# Patient Record
Sex: Female | Born: 1955
Health system: Southern US, Community
[De-identification: ages and names within clinical notes are randomized; demographics above are authoritative.]

## PROBLEM LIST (undated history)

## (undated) DIAGNOSIS — G4730 Sleep apnea, unspecified: Secondary | ICD-10-CM

## (undated) DIAGNOSIS — E119 Type 2 diabetes mellitus without complications: Secondary | ICD-10-CM

## (undated) DIAGNOSIS — J45909 Unspecified asthma, uncomplicated: Secondary | ICD-10-CM

## (undated) DIAGNOSIS — Z9889 Other specified postprocedural states: Secondary | ICD-10-CM

## (undated) DIAGNOSIS — D649 Anemia, unspecified: Secondary | ICD-10-CM

## (undated) DIAGNOSIS — R600 Localized edema: Secondary | ICD-10-CM

## (undated) DIAGNOSIS — I10 Essential (primary) hypertension: Secondary | ICD-10-CM

## (undated) DIAGNOSIS — I4891 Unspecified atrial fibrillation: Secondary | ICD-10-CM

## (undated) DIAGNOSIS — D481 Neoplasm of uncertain behavior of connective and other soft tissue: Secondary | ICD-10-CM

## (undated) DIAGNOSIS — K219 Gastro-esophageal reflux disease without esophagitis: Secondary | ICD-10-CM

## (undated) DIAGNOSIS — D4819 Other specified neoplasm of uncertain behavior of connective and other soft tissue: Secondary | ICD-10-CM

## (undated) DIAGNOSIS — Z8669 Personal history of other diseases of the nervous system and sense organs: Secondary | ICD-10-CM

## (undated) DIAGNOSIS — E669 Obesity, unspecified: Secondary | ICD-10-CM

## (undated) DIAGNOSIS — G43909 Migraine, unspecified, not intractable, without status migrainosus: Secondary | ICD-10-CM

## (undated) DIAGNOSIS — I249 Acute ischemic heart disease, unspecified: Secondary | ICD-10-CM

## (undated) DIAGNOSIS — I514 Myocarditis, unspecified: Secondary | ICD-10-CM

## (undated) DIAGNOSIS — I480 Paroxysmal atrial fibrillation: Secondary | ICD-10-CM

## (undated) DIAGNOSIS — E785 Hyperlipidemia, unspecified: Secondary | ICD-10-CM

## (undated) DIAGNOSIS — R51 Headache: Secondary | ICD-10-CM

## (undated) DIAGNOSIS — I499 Cardiac arrhythmia, unspecified: Secondary | ICD-10-CM

## (undated) HISTORY — DX: Hyperlipidemia, unspecified: E78.5

## (undated) HISTORY — DX: Cardiac arrhythmia, unspecified: I49.9

## (undated) HISTORY — PX: OTHER SURGICAL HISTORY: SHX169

## (undated) HISTORY — DX: Acute ischemic heart disease, unspecified: I24.9

## (undated) HISTORY — DX: Myocarditis, unspecified: I51.4

## (undated) HISTORY — DX: Personal history of other diseases of the nervous system and sense organs: Z86.69

## (undated) HISTORY — DX: Essential (primary) hypertension: I10

## (undated) HISTORY — DX: Anemia, unspecified: D64.9

## (undated) HISTORY — DX: Type 2 diabetes mellitus without complications: E11.9

## (undated) HISTORY — DX: Obesity, unspecified: E66.9

## (undated) HISTORY — DX: Other specified neoplasm of uncertain behavior of connective and other soft tissue: D48.19

## (undated) HISTORY — PX: CATARACT EXTRACTION: SUR2

## (undated) HISTORY — DX: Unspecified asthma, uncomplicated: J45.909

## (undated) HISTORY — DX: Neoplasm of uncertain behavior of connective and other soft tissue: D48.1

## (undated) HISTORY — DX: Other specified postprocedural states: Z98.890

## (undated) HISTORY — DX: Sleep apnea, unspecified: G47.30

## (undated) HISTORY — DX: Migraine, unspecified, not intractable, without status migrainosus: G43.909

---
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
## 2009-02-18 ENCOUNTER — Ambulatory Visit: Payer: Self-pay | Admitting: Orthopedic Surgery

## 2009-02-18 DIAGNOSIS — IMO0002 Reserved for concepts with insufficient information to code with codable children: Secondary | ICD-10-CM

## 2009-02-18 DIAGNOSIS — S8000XA Contusion of unspecified knee, initial encounter: Secondary | ICD-10-CM

## 2009-02-19 ENCOUNTER — Encounter: Payer: Self-pay | Admitting: Orthopedic Surgery

## 2009-03-16 ENCOUNTER — Encounter: Payer: Self-pay | Admitting: Orthopedic Surgery

## 2009-12-23 ENCOUNTER — Encounter: Payer: Self-pay | Admitting: Physician Assistant

## 2009-12-23 ENCOUNTER — Ambulatory Visit: Payer: Self-pay | Admitting: Family Medicine

## 2009-12-23 DIAGNOSIS — G43909 Migraine, unspecified, not intractable, without status migrainosus: Secondary | ICD-10-CM | POA: Insufficient documentation

## 2009-12-23 DIAGNOSIS — E669 Obesity, unspecified: Secondary | ICD-10-CM | POA: Insufficient documentation

## 2009-12-23 DIAGNOSIS — I10 Essential (primary) hypertension: Secondary | ICD-10-CM | POA: Insufficient documentation

## 2009-12-23 DIAGNOSIS — K2990 Gastroduodenitis, unspecified, without bleeding: Secondary | ICD-10-CM

## 2009-12-23 DIAGNOSIS — K2970 Gastritis, unspecified, without bleeding: Secondary | ICD-10-CM | POA: Insufficient documentation

## 2009-12-23 DIAGNOSIS — E785 Hyperlipidemia, unspecified: Secondary | ICD-10-CM | POA: Insufficient documentation

## 2009-12-24 LAB — CONVERTED CEMR LAB
Alkaline Phosphatase: 77 units/L (ref 39–117)
BUN: 14 mg/dL (ref 6–23)
Creatinine, Ser: 0.8 mg/dL (ref 0.40–1.20)
Glucose, Bld: 100 mg/dL — ABNORMAL HIGH (ref 70–99)
HCT: 40.4 % (ref 36.0–46.0)
HDL: 36 mg/dL — ABNORMAL LOW (ref >39)
Hemoglobin: 12.2 g/dL (ref 12.0–15.0)
LDL Cholesterol: 184 mg/dL — ABNORMAL HIGH (ref 0–99)
MCHC: 30.2 g/dL (ref 30.0–36.0)
MCV: 67.3 fL — ABNORMAL LOW (ref 78.0–100.0)
RBC: 6 M/uL — ABNORMAL HIGH (ref 3.87–5.11)
Total Bilirubin: 0.3 mg/dL (ref 0.3–1.2)
Triglycerides: 81 mg/dL (ref <150)
VLDL: 16 mg/dL (ref 0–40)

## 2010-01-11 ENCOUNTER — Other Ambulatory Visit: Admission: RE | Admit: 2010-01-11 | Discharge: 2010-01-11 | Payer: Self-pay | Admitting: Obstetrics & Gynecology

## 2010-01-18 ENCOUNTER — Ambulatory Visit (HOSPITAL_COMMUNITY): Admission: RE | Admit: 2010-01-18 | Discharge: 2010-01-18 | Payer: Self-pay | Admitting: Obstetrics & Gynecology

## 2010-02-11 ENCOUNTER — Emergency Department (HOSPITAL_COMMUNITY): Admission: EM | Admit: 2010-02-11 | Discharge: 2010-02-11 | Payer: Self-pay | Admitting: Emergency Medicine

## 2010-02-15 ENCOUNTER — Ambulatory Visit: Payer: Self-pay | Admitting: Family Medicine

## 2010-02-15 DIAGNOSIS — D649 Anemia, unspecified: Secondary | ICD-10-CM

## 2010-02-15 DIAGNOSIS — R7309 Other abnormal glucose: Secondary | ICD-10-CM

## 2010-02-15 DIAGNOSIS — R079 Chest pain, unspecified: Secondary | ICD-10-CM

## 2010-02-16 ENCOUNTER — Encounter: Payer: Self-pay | Admitting: Physician Assistant

## 2010-02-26 ENCOUNTER — Encounter (INDEPENDENT_AMBULATORY_CARE_PROVIDER_SITE_OTHER): Payer: Self-pay | Admitting: *Deleted

## 2010-04-02 ENCOUNTER — Encounter: Payer: Self-pay | Admitting: Family Medicine

## 2010-04-08 LAB — CONVERTED CEMR LAB
Basophils Relative: 0 % (ref 0–1)
Eosinophils Absolute: 0.2 10*3/uL (ref 0.0–0.7)
Eosinophils Relative: 2 % (ref 0–5)
HCT: 39.2 % (ref 36.0–46.0)
Iron: 34 ug/dL — ABNORMAL LOW (ref 42–145)
MCHC: 30.9 g/dL (ref 30.0–36.0)
MCV: 66.8 fL — ABNORMAL LOW (ref 78.0–100.0)
Monocytes Relative: 6 % (ref 3–12)
Neutrophils Relative %: 68 % (ref 43–77)
Platelets: 330 10*3/uL (ref 150–400)
Vitamin B-12: 528 pg/mL (ref 211–911)

## 2010-06-01 NOTE — Letter (Signed)
Summary: 1st missed letter  1st missed letter   Imported By: Lind Guest 04/02/2010 13:32:53  _____________________________________________________________________  External Attachment:    Type:   Image     Comment:   External Document

## 2010-06-01 NOTE — Assessment & Plan Note (Signed)
Summary: office visit   Vital Signs:  Patient profile:   55 year old female Height:      63.75 inches Weight:      306.75 pounds BMI:     53.26 O2 Sat:      97 % on Room air Pulse rate:   111 / minute Resp:     16 per minute BP sitting:   126 / 80  (left arm)  Vitals Entered By: Mauricia Area CMA (February 15, 2010 2:25 PM)  Nutrition Counseling: Patient's BMI is greater than 25 and therefore counseled on weight management options. CC: follow up   Referring Provider:  Dr. Wende Crease  CC:  follow up.  History of Present Illness: Pt presents today for check up. Pt states she went to GYN.  Pap and Mamm normal. Was at [facility] on 02-11-10 for chest pain. Told to follow up for additional cardiac eval and needs repeat CBC due to elevated WBCs.  Pt states her chest pain felt like an elephant on her chest and her arms and fingers started to tingle.  No diaphoresis. Hx of GI problems, but tums didnt help.  Pt did not keep appt with GI for screening colonoscopy. Hx of migraines.  Has been doing well, and hasnt needed to take triptan in awhile.  Allergies (verified): No Known Drug Allergies  Past History:  Past medical history reviewed for relevance to current acute and chronic problems.  Past Medical History: Reviewed history from 12/23/2009 and no changes required. migraines seasonal allergies obesity Gastritis  Review of Systems General:  Denies chills and fever. ENT:  Denies earache, nasal congestion, and sore throat. CV:  Complains of chest pain or discomfort; denies palpitations. Resp:  Denies cough and shortness of breath. GI:  Complains of indigestion; denies abdominal pain, nausea, and vomiting.  Physical Exam  General:  Well-developed,well-nourished,in no acute distress; alert,appropriate and cooperative throughout examination Head:  Normocephalic and atraumatic without obvious abnormalities. No apparent alopecia or balding. Ears:  External ear exam shows no  significant lesions or deformities.  Otoscopic examination reveals clear canals, tympanic membranes are intact bilaterally without bulging, retraction, inflammation or discharge. Hearing is grossly normal bilaterally. Nose:  External nasal examination shows no deformity or inflammation. Nasal mucosa are pink and moist without lesions or exudates. Mouth:  Oral mucosa and oropharynx without lesions or exudates.   Neck:  No deformities, masses, or tenderness noted. Chest Wall:  no tenderness.   Lungs:  Normal respiratory effort, chest expands symmetrically. Lungs are clear to auscultation, no crackles or wheezes. Heart:  Normal rate and regular rhythm. S1 and S2 normal without gallop, murmur, click, rub or other extra sounds. Cervical Nodes:  No lymphadenopathy noted Psych:  Cognition and judgment appear intact. Alert and cooperative with normal attention span and concentration. No apparent delusions, illusions, hallucinations   Impression & Recommendations:  Problem # 1:  CHEST PAIN (ICD-786.50) Assessment New  Orders: Cardiology Referral (Cardiology)  Problem # 2:  ELEVATED BLOOD PRESSURE (ICD-796.2) Assessment: Improved  BP today: 126/80 Prior BP: 134/90 (12/23/2009)  Labs Reviewed: Creat: 0.80 (12/23/2009) Chol: 236 (12/23/2009)   HDL: 36 (12/23/2009)   LDL: 184 (12/23/2009)   TG: 81 (12/23/2009)  Instructed in low sodium diet (DASH Handout) and behavior modification.    Problem # 3:  PRE-DIABETES (ICD-790.29) Assessment: Comment Only  Orders: T-Comprehensive Metabolic Panel [id number]) T- Hemoglobin A1C ([id number])  Labs Reviewed: Creat: 0.80 (12/23/2009)     Problem # 4:  MIGRAINE HEADACHE (ICD-346.90) Assessment: Comment  Only  Her updated medication list for this problem includes:    Sumatriptan Succinate 100 Mg Tabs (Sumatriptan succinate) .Marland Kitchen... Take 1 tab at onset of migraine.  may take 2nd tab 2 hrs later if needed.  max 2 tabs per 24 hrs.  Complete  Medication List: 1)  Sumatriptan Succinate 100 Mg Tabs (Sumatriptan succinate) .... Take 1 tab at onset of migraine.  may take 2nd tab 2 hrs later if needed.  max 2 tabs per 24 hrs.  Other Orders: T-Lipid Profile [id number]) T-CBC w/Diff [id number]) T-Iron [id number]) T-Vitamin B12 ([id number])  Patient Instructions: 1)  Follow up appt in 6 weeks. 2)  I have ordered blood work to have drawn the end of this week to check your iron and anemia. 3)  I have ordered blood work to have drawn fasting before you appt in 6 weeks to recheck your cholesterol and sugar. 4)  I have referred you to a cardiologist. 5)  It is important that you exercise regularly at least 20 minutes 5 times a week. If you develop chest pain, have severe difficulty breathing, or feel very tired , stop exercising immediately and seek medical attention. 6)  You need to lose weight. Consider a lower calorie diet and regular exercise.  7)  CONGRATULATIONS ON QUITTING SMOKING!   Orders Added: 1)  Cardiology Referral [Cardiology] 2)  T-Comprehensive Metabolic Panel [[id number]] 3)  T-Lipid Profile [[id number]] 4)  T- Hemoglobin A1C [[id number]] 5)  T-CBC w/Diff [[id number]] 6)  T-Iron [[id number]] 7)  T-Vitamin B12 [[id number]] 8)  Est. Patient Level IV [88416]

## 2010-06-01 NOTE — Letter (Signed)
Summary: MED REVIEW SHEET SIGNED  MED REVIEW SHEET SIGNED   Imported By: Lind Guest 02/16/2010 08:27:15  _____________________________________________________________________  External Attachment:    Type:   Image     Comment:   External Document

## 2010-06-01 NOTE — Assessment & Plan Note (Signed)
Summary: new patient- room 1   Vital Signs:  Patient profile:   55 year old female Height:      63.75 inches Weight:      300.25 pounds BMI:     52.13 O2 Sat:      95 % on Room air Pulse rate:   100 / minute Resp:     16 per minute BP sitting:   134 / 90  (left arm)  Vitals Entered By: Adella Hare LPN (December 23, 2009 8:56 AM)  Nutrition Counseling: Patient's BMI is greater than 25 and therefore counseled on weight management options.  Serial Vital Signs/Assessments:  Time      Position  BP       Pulse  Resp  Temp     By                     120/94                         Esperanza Sheets PA  CC: new patient, Headache Is Patient Diabetic? No Pain Assessment Patient in pain? no        Referring Provider:  Dr. Wende Crease  CC:  new patient and Headache.  History of Present Illness: New pt here to establish care with new PCP. Last physical 1 1/2 yrs ago.  Hx of migraines.  Infrequent. When occurs HA is "severe" with  nausea, photophobia and phonophobia.  Was seen at Urgent Care Mon 8/23 and treated with Imitrex. Worked well.  Had not used Imitrex previous to that.  Hx of dry eyes.  Was on eye drops in past (Restasis).  Has run out & requests referral to eye dr. Hx of seasonal allergies.  uses over the counter allergy meds and work well. Uses prilosec daily for gastritis. Takes 3-4 times a day though.  Was discoved when having chest pain.  Cardiac cath neg. No EGD or colonoscopy. Cholesterol high on previous labs.  No prescription meds. Requests referral to GYN for pap and breast exam.  Uncertain last Td.  Current Medications (verified): 1)  None  Allergies (verified): No Known Drug Allergies  Past History:  Past medical, surgical, family and social histories (including risk factors) reviewed, and no changes noted (except as noted below).  Past Medical History: migraines seasonal allergies obesity Gastritis  Past Surgical History: Carpal tunnel release (right)  2000  Family History: Reviewed history from 02/18/2009 and no changes required. Family History of Diabetes - mother Family History Coronary Heart Disease female < 72 - mother Family History of Arthritis - mother, father Altzheimers x 2 parents  Social History: Reviewed history from 02/18/2009 and no changes required. Patient is single.  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 Eyes:  Complains of blurring; denies double vision. ENT:  Denies earache, nasal congestion, and sore throat. CV:  Denies chest pain or discomfort and palpitations. Resp:  Denies cough and shortness of breath. GI:  Denies abdominal pain, bloody stools, change in bowel habits, dark tarry stools, indigestion, nausea, and vomiting. GU:  Denies dysuria and urinary frequency. Neuro:  Complains of headaches; denies numbness and tingling. Allergy:  Complains of seasonal allergies.  Physical Exam  General:  Well-developed,well-nourished,in no acute distress; alert,appropriate and cooperative  throughout examination Head:  Normocephalic and atraumatic without obvious abnormalities. No apparent alopecia or balding. Ears:  External ear exam shows no significant lesions or deformities.  Otoscopic examination reveals clear canals, tympanic membranes are intact bilaterally without bulging, retraction, inflammation or discharge. Hearing is grossly normal bilaterally. Nose:  External nasal examination shows no deformity or inflammation. Nasal mucosa are pink and moist without lesions or exudates. Mouth:  Oral mucosa and oropharynx without lesions or exudates.  Teeth in good repair. Neck:  No deformities, masses, or tenderness noted. Lungs:  Normal respiratory effort, chest expands symmetrically. Lungs are clear to  auscultation, no crackles or wheezes. Heart:  Normal rate and regular rhythm. S1 and S2 normal without gallop, murmur, click, rub or other extra sounds. Abdomen:  Bowel sounds positive,abdomen soft and non-tender without masses, organomegaly or hernias noted. Pulses:  R dorsalis pedis normal and L dorsalis pedis normal.   Extremities:  No PTE Neurologic:  alert & oriented X3, sensation intact to light touch, and gait normal.   Cervical Nodes:  No lymphadenopathy noted Psych:  Cognition and judgment appear intact. Alert and cooperative with normal attention span and concentration. No apparent delusions, illusions, hallucinations   Impression & Recommendations:  Problem # 1:  MIGRAINE HEADACHE (ICD-346.90) Assessment Comment Only  The following medications were removed from the medication list:    Ultram 50 Mg Tabs (Tramadol hcl) ..... One by mouth q 6 hrs Her updated medication list for this problem includes:    Sumatriptan Succinate 100 Mg Tabs (Sumatriptan succinate) .Marland Kitchen... Take 1 tab at onset of migraine.  may take 2nd tab 2 hrs later if needed.  max 2 tabs per 24 hrs.  Problem # 2:  ELEVATED BLOOD PRESSURE (ICD-796.2) Assessment: New Diastolic elevation. DASH diet h/o given. Will recheck at f/u appt.  Problem # 3:  GASTRITIS (ICD-535.50) Assessment: Comment Only Will refer to GI for screening colonoscopy and evaluation of gastritis.  Pt will continue omeprazole at this time.  Problem # 4:  HYPERLIPIDEMIA (ICD-272.4) Assessment: Comment Only Await labs.  Orders: T-Comprehensive Metabolic Panel [id number]) T-Lipid Profile ([id number])  Problem # 5:  OBESITY (ICD-278.00) Assessment: Comment Only Discussed diet and exercise.  Serving size h/o given.  Orders: T-Comprehensive Metabolic Panel [id number]) T-TSH [id number]) T- Hemoglobin A1C ([id number])  Ht: 63.75 (12/23/2009)   Wt: 300.25 (12/23/2009)   BMI: 52.13 (12/23/2009)  Complete Medication List: 1)   Sumatriptan Succinate 100 Mg Tabs (Sumatriptan succinate) .... Take 1 tab at onset of migraine.  may take 2nd tab 2 hrs later if needed.  max 2 tabs per 24 hrs.  Other Orders: T-CBC No Diff ([id number]) Gastroenterology Referral (GI) Gynecologic Referral (Gyn) Ophthalmology Referral (Ophthalmology) Tdap => 72yrs IM [id number]) Admin 1st Vaccine (29518) Admin 1st Vaccine [facility]) [id number])  Patient Instructions: 1)  Please schedule a follow-up appointment in 1 month. 2)  It is important that you exercise regularly at least 20 minutes 5 times a week. If you develop chest pain, have severe difficulty breathing, or feel very tired , stop exercising immediately and seek medical attention. 3)  You need to lose weight. Consider a lower calorie diet and regular exercise.  4)  I have ordered blood work. 5)  I have referred you to the eye dr, GI dr, and GYN. 6)  Congratulations on quitting smoking!  Keep up the good work. Prescriptions: SUMATRIPTAN SUCCINATE 100 MG TABS (SUMATRIPTAN SUCCINATE) take 1 tab at onset of migraine.  may take 2nd tab 2 hrs later if needed.  max 2 tabs per 24 hrs.  #9 x 1   Entered and Authorized by:   Esperanza Sheets PA   Signed by:   Esperanza Sheets PA on 12/23/2009   Method used:   Electronically to        [facility]  [address] (retail)       [address]       [place], [place]  [address]       Ph: [phone]       Fax: [phone]   RxID:   [id number]    Tetanus/Td Vaccine    Vaccine Type: Tdap    Site: right deltoid    Mfr: GlaxoSmithKline    Dose: 0.5 ml    Route: IM    Given by: Adella Hare LPN    Exp. Date: 07/25/2011    Lot #: [id number]    VIS given: 03/20/07 version given December 23, 2009.

## 2010-06-01 NOTE — Letter (Signed)
Summary: Appointment - Missed  [facility] [place]  [address] [place] [address]   Phone: [phone]  Fax: [phone]     February 26, 2010 MRN: [id number]   TALAH COOKSTON [address] [place], [place]  [address]   Dear Ms. Ronn Melena,  Our records indicate you missed your appointment on         02/26/10               with Dr.   Diona Browner    .                                    It is very important that we reach you to reschedule this appointment. We look forward to participating in your health care needs. Please contact us at the number listed above at your earliest convenience to reschedule this appointment.     Sincerely,    Glass blower/designer

## 2010-07-15 LAB — DIFFERENTIAL
Eosinophils Relative: 2 % (ref 0–5)
Lymphocytes Relative: 18 % (ref 12–46)
Lymphs Abs: 2.3 10*3/uL (ref 0.7–4.0)
Monocytes Absolute: 0.6 10*3/uL (ref 0.1–1.0)
Monocytes Relative: 5 % (ref 3–12)
Neutro Abs: 9.4 10*3/uL — ABNORMAL HIGH (ref 1.7–7.7)

## 2010-07-15 LAB — CBC
HCT: 36 % (ref 36.0–46.0)
Hemoglobin: 11.6 g/dL — ABNORMAL LOW (ref 12.0–15.0)
MCV: 65.1 fL — ABNORMAL LOW (ref 78.0–100.0)
RDW: 18.1 % — ABNORMAL HIGH (ref 11.5–15.5)
WBC: 12.5 10*3/uL — ABNORMAL HIGH (ref 4.0–10.5)

## 2010-07-15 LAB — BASIC METABOLIC PANEL
BUN: 11 mg/dL (ref 6–23)
Chloride: 105 mEq/L (ref 96–112)
Potassium: 3.7 mEq/L (ref 3.5–5.1)
Sodium: 139 mEq/L (ref 135–145)

## 2010-07-15 LAB — POCT CARDIAC MARKERS: Troponin i, poc: <0.05 ng/mL (ref 0.00–0.09)

## 2010-07-15 LAB — URINALYSIS, ROUTINE W REFLEX MICROSCOPIC
Glucose, UA: NEGATIVE mg/dL
Hgb urine dipstick: NEGATIVE
Specific Gravity, Urine: >1.03 — ABNORMAL HIGH (ref 1.005–1.030)
pH: 5.5 (ref 5.0–8.0)

## 2010-07-30 ENCOUNTER — Telehealth: Payer: Self-pay | Admitting: Family Medicine

## 2010-07-30 NOTE — Telephone Encounter (Signed)
NOTED AND AGREE.

## 2010-07-30 NOTE — Telephone Encounter (Signed)
patient called in wanted to come in, she states she left she left work yesterday due to mild chest pain, and this morning she had tingling and numbing in her arm.  I went and spoke with Asher Muir, she said patient needed to either go to Urgent care or ER.  I advised patient what Asher Muir said and patient said okay.

## 2010-08-25 ENCOUNTER — Ambulatory Visit (INDEPENDENT_AMBULATORY_CARE_PROVIDER_SITE_OTHER): Payer: BC Managed Care – PPO | Admitting: Family Medicine

## 2010-08-25 VITALS — BP 144/96 | Wt 303.0 lb

## 2010-08-25 DIAGNOSIS — Z111 Encounter for screening for respiratory tuberculosis: Secondary | ICD-10-CM

## 2010-08-25 NOTE — Progress Notes (Signed)
PPD place in right arm and advised to come back Friday morning to have it read

## 2010-08-27 ENCOUNTER — Encounter: Payer: Self-pay | Admitting: Family Medicine

## 2010-08-27 ENCOUNTER — Ambulatory Visit (INDEPENDENT_AMBULATORY_CARE_PROVIDER_SITE_OTHER): Payer: BC Managed Care – PPO | Admitting: Family Medicine

## 2010-08-27 ENCOUNTER — Other Ambulatory Visit: Payer: Self-pay | Admitting: Family Medicine

## 2010-08-27 VITALS — BP 150/100 | HR 74 | Resp 16 | Wt 303.8 lb

## 2010-08-27 DIAGNOSIS — I10 Essential (primary) hypertension: Secondary | ICD-10-CM

## 2010-08-27 DIAGNOSIS — R5381 Other malaise: Secondary | ICD-10-CM

## 2010-08-27 DIAGNOSIS — H6120 Impacted cerumen, unspecified ear: Secondary | ICD-10-CM

## 2010-08-27 DIAGNOSIS — E669 Obesity, unspecified: Secondary | ICD-10-CM

## 2010-08-27 DIAGNOSIS — R7309 Other abnormal glucose: Secondary | ICD-10-CM

## 2010-08-27 DIAGNOSIS — D649 Anemia, unspecified: Secondary | ICD-10-CM

## 2010-08-27 DIAGNOSIS — R5383 Other fatigue: Secondary | ICD-10-CM

## 2010-08-27 DIAGNOSIS — Z13820 Encounter for screening for osteoporosis: Secondary | ICD-10-CM

## 2010-08-27 DIAGNOSIS — J309 Allergic rhinitis, unspecified: Secondary | ICD-10-CM

## 2010-08-27 DIAGNOSIS — R7301 Impaired fasting glucose: Secondary | ICD-10-CM

## 2010-08-27 DIAGNOSIS — Z1211 Encounter for screening for malignant neoplasm of colon: Secondary | ICD-10-CM

## 2010-08-27 DIAGNOSIS — R030 Elevated blood-pressure reading, without diagnosis of hypertension: Secondary | ICD-10-CM

## 2010-08-27 DIAGNOSIS — E785 Hyperlipidemia, unspecified: Secondary | ICD-10-CM

## 2010-08-27 LAB — LIPID PANEL
Cholesterol: 187 mg/dL (ref 0–200)
LDL Cholesterol: 140 mg/dL — ABNORMAL HIGH (ref 0–99)
Total CHOL/HDL Ratio: 6 Ratio
Triglycerides: 81 mg/dL (ref <150)
VLDL: 16 mg/dL (ref 0–40)

## 2010-08-27 LAB — BASIC METABOLIC PANEL
BUN: 11 mg/dL (ref 6–23)
Chloride: 103 mEq/L (ref 96–112)
Glucose, Bld: 110 mg/dL — ABNORMAL HIGH (ref 70–99)
Potassium: 4.2 mEq/L (ref 3.5–5.3)
Sodium: 140 mEq/L (ref 135–145)

## 2010-08-27 LAB — TB SKIN TEST
Induration: 0
TB Skin Test: NEGATIVE mm

## 2010-08-27 MED ORDER — FLUTICASONE PROPIONATE 50 MCG/ACT NA SUSP
1.0000 | Freq: Every day | NASAL | Status: DC
Start: 2010-08-27 — End: 2011-03-03

## 2010-08-27 MED ORDER — METHYLPREDNISOLONE ACETATE 80 MG/ML IJ SUSP
80.0000 mg | Freq: Once | INTRAMUSCULAR | Status: AC
  Administered 2010-08-27: 80 mg via INTRAMUSCULAR

## 2010-08-27 MED ORDER — PREDNISONE (PAK) 5 MG PO TABS: 5.0000 mg | ORAL_TABLET | ORAL | Status: DC

## 2010-08-27 MED ORDER — BENAZEPRIL-HYDROCHLOROTHIAZIDE 20-12.5 MG PO TABS: 1.0000 | ORAL_TABLET | Freq: Every day | ORAL | Status: DC

## 2010-08-27 NOTE — Patient Instructions (Signed)
F/u in 6 weeks.  It is important that you exercise regularly at least 30 minutes 5 times a week. If you develop chest pain, have severe difficulty breathing, or feel very tired, stop exercising immediately and seek medical attention  A healthy diet is rich in fruit, vegetables and whole grains. Poultry fish, nuts and beans are a healthy choice for protein rather then red meat. A low sodium diet and drinking 64 ounces of water daily is generally recommended. Oils and sweet should be limited. Carbohydrates especially for those who are diabetic or overweight, should be limited to 34-45 gram per meal. It is important to eat on a regular schedule, at least 3 times daily. Snacks should be primarily fruits, vegetables or nuts. Fasting labs  Today  Your blood pressure is still high,I will add benazepril/hctz  You will get med for your allergies including an injection   You will be referred for a colonscopy

## 2010-08-28 LAB — CBC WITH DIFFERENTIAL/PLATELET
Basophils Absolute: 0 10*3/uL (ref 0.0–0.1)
Basophils Relative: 0 % (ref 0–1)
HCT: 38 % (ref 36.0–46.0)
Hemoglobin: 11.5 g/dL — ABNORMAL LOW (ref 12.0–15.0)
Lymphocytes Relative: 19 % (ref 12–46)
MCHC: 30.3 g/dL (ref 30.0–36.0)
Monocytes Absolute: 0.6 10*3/uL (ref 0.1–1.0)
Monocytes Relative: 7 % (ref 3–12)
Neutro Abs: 6 10*3/uL (ref 1.7–7.7)
Neutrophils Relative %: 72 % (ref 43–77)
WBC: 8.4 10*3/uL (ref 4.0–10.5)

## 2010-08-28 LAB — HEMOGLOBIN A1C: Mean Plasma Glucose: 154 mg/dL — ABNORMAL HIGH (ref <117)

## 2010-08-29 ENCOUNTER — Encounter: Payer: Self-pay | Admitting: Family Medicine

## 2010-08-29 NOTE — Assessment & Plan Note (Signed)
Uncontrolled, additional med to be started. DASH diet discussed and provided. Regular exercise encouraged

## 2010-08-29 NOTE — Assessment & Plan Note (Signed)
Deteriorated, lifestyle change in terms of food choices, portion size , and commitment to regular exercise discussed and encouraged

## 2010-08-29 NOTE — Assessment & Plan Note (Signed)
:  Low fat diet discussed and encouraged. Will likely need to start medication , espescialy if diabetic

## 2010-08-29 NOTE — Assessment & Plan Note (Signed)
Deteriorate, injections administered in office and med prescribed

## 2010-08-29 NOTE — Assessment & Plan Note (Signed)
Pt to use wax softener and return for irrigation

## 2010-08-29 NOTE — Progress Notes (Signed)
  Subjective:    Patient ID: Monique Gomez, female    DOB: [birth year], 55 y.o.   MRN: [id number]  HPI Pt in with a primary c/o uncontrolled allergy symptoms x 2 weeks. She is experiencing nasal congestion with clear drainage, sneezing and watery eyes. She denies fever, chills, sore throat or productive cough. She was recently seen at the urgent care for palpitations attributed to stress. At that time she was dx as hypertensive and atenolol started which she tolerates. She has had a pap but still needs a colonoscopy, she has no known famh/o colon cancer and is asymptomatic . She reports a 20 pound weight gain in the past year, poor eating habits and has a dx of prediabetes.   Review of Systems Denies recent fever or chills. Reports  sinus pressure, nasal congestion,bilateral ear pain no sore throat. Denies chest congestion, productive cough or wheezing. Denies chest pains, palpitations, paroxysmal nocturnal dyspnea, orthopnea and leg swelling Denies abdominal pain, nausea, vomiting,diarrhea or constipation.  Denies rectal bleeding or change in bowel movement. Denies dysuria, frequency, hesitancy or incontinence. Denies joint pain, swelling and limitation in mobility. Denies headaches, seizure, numbness, or tingling. Denies depression, anxiety or insomnia.Reports reduced stress since conditions on the job improved. Denies skin break down or rash.        Objective:   Physical Exam Patient alert and oriented and in no Cardiopulmonary distress.  HEENT: No facial asymmetry, EOMI, no sinus tenderness, TM' soccluded by wax, right worse than left,, Oropharynx pink and moist.  Neck supple no adenopathy.Erythema and edema of nasal mucosa, excessive watering of eyes  Chest: Clear to auscultation bilaterally.  CVS: S1, S2 no murmurs, no S3.  ABD: Soft non tender. Bowel sounds normal.  Ext: No edema  MS: Adequate ROM spine, shoulders, hips and knees.  Skin: Intact, no ulcerations or  rash noted.  Psych: Good eye contact, normal affect. Memory intact not anxious or depressed appearing.  CNS: CN 2-12 intact, power, tone and sensation normal throughout.        Assessment & Plan:

## 2010-08-29 NOTE — Assessment & Plan Note (Signed)
Updated lab data needed 

## 2010-08-30 LAB — ANEMIA PANEL
Iron: 31 ug/dL — ABNORMAL LOW (ref 42–145)
UIBC: 340 ug/dL

## 2010-08-30 LAB — HEPATIC FUNCTION PANEL
ALT: <8 U/L (ref 0–35)
AST: 10 U/L (ref 0–37)
Alkaline Phosphatase: 80 U/L (ref 39–117)
Bilirubin, Direct: 0.1 mg/dL (ref 0.0–0.3)
Indirect Bilirubin: 0.1 mg/dL (ref 0.0–0.9)
Total Bilirubin: 0.2 mg/dL — ABNORMAL LOW (ref 0.3–1.2)

## 2010-08-31 ENCOUNTER — Encounter: Payer: Self-pay | Admitting: Family Medicine

## 2010-08-31 ENCOUNTER — Other Ambulatory Visit: Payer: Self-pay

## 2010-08-31 MED ORDER — PRAVASTATIN SODIUM 40 MG PO TABS: 40.0000 mg | ORAL_TABLET | Freq: Every evening | ORAL | Status: DC

## 2010-08-31 MED ORDER — METFORMIN HCL 500 MG PO TABS: 500.0000 mg | ORAL_TABLET | Freq: Three times a day (TID) | ORAL | Status: DC

## 2010-09-02 ENCOUNTER — Ambulatory Visit (INDEPENDENT_AMBULATORY_CARE_PROVIDER_SITE_OTHER): Payer: BC Managed Care – PPO | Admitting: Family Medicine

## 2010-09-02 VITALS — BP 130/90 | Wt 297.0 lb

## 2010-09-02 DIAGNOSIS — H6120 Impacted cerumen, unspecified ear: Secondary | ICD-10-CM

## 2010-09-02 DIAGNOSIS — E119 Type 2 diabetes mellitus without complications: Secondary | ICD-10-CM

## 2010-09-02 MED ORDER — ONETOUCH DELICA LANCETS MISC: Status: DC

## 2010-09-02 MED ORDER — GLUCOSE BLOOD VI STRP: ORAL_STRIP | Status: AC

## 2010-09-02 NOTE — Progress Notes (Signed)
Bilateral ear irrigation and diabetic teaching. Right ear successfully irrigated. Left ear still impacted. Advise use of wax softner and we would attempt again at her next visit or she could come back in for nurse visit

## 2010-10-07 ENCOUNTER — Ambulatory Visit: Payer: BC Managed Care – PPO | Admitting: Family Medicine

## 2010-10-13 ENCOUNTER — Emergency Department (HOSPITAL_COMMUNITY)
Admission: EM | Admit: 2010-10-13 | Discharge: 2010-10-13 | Disposition: A | Discharge status: Home / Self Care | Payer: No Typology Code available for payment source | Attending: Emergency Medicine | Admitting: Emergency Medicine

## 2010-10-13 DIAGNOSIS — K219 Gastro-esophageal reflux disease without esophagitis: Secondary | ICD-10-CM | POA: Insufficient documentation

## 2010-10-13 DIAGNOSIS — Y92410 Unspecified street and highway as the place of occurrence of the external cause: Secondary | ICD-10-CM | POA: Insufficient documentation

## 2010-10-13 DIAGNOSIS — R079 Chest pain, unspecified: Secondary | ICD-10-CM | POA: Insufficient documentation

## 2010-10-13 DIAGNOSIS — I10 Essential (primary) hypertension: Secondary | ICD-10-CM | POA: Insufficient documentation

## 2010-10-13 DIAGNOSIS — E119 Type 2 diabetes mellitus without complications: Secondary | ICD-10-CM | POA: Insufficient documentation

## 2010-10-13 DIAGNOSIS — M25559 Pain in unspecified hip: Secondary | ICD-10-CM | POA: Insufficient documentation

## 2010-10-13 DIAGNOSIS — T148XXA Other injury of unspecified body region, initial encounter: Secondary | ICD-10-CM | POA: Insufficient documentation

## 2010-10-13 DIAGNOSIS — M549 Dorsalgia, unspecified: Secondary | ICD-10-CM | POA: Insufficient documentation

## 2010-10-13 DIAGNOSIS — M542 Cervicalgia: Secondary | ICD-10-CM | POA: Insufficient documentation

## 2010-10-19 ENCOUNTER — Telehealth: Payer: Self-pay | Admitting: Family Medicine

## 2010-10-19 NOTE — Telephone Encounter (Signed)
Patient needs to go to er doesn't she?

## 2010-10-19 NOTE — Telephone Encounter (Signed)
Let her know needs to be evaluated by an md, I cannot just order an x ray, she can go to urgent care for eval, if there is an opening in the ioffice, which i doubt, then she can be seen here, pls let her know

## 2010-10-20 NOTE — Telephone Encounter (Signed)
Patient aware.

## 2010-10-26 ENCOUNTER — Emergency Department (HOSPITAL_COMMUNITY)
Admission: EM | Admit: 2010-10-26 | Discharge: 2010-10-26 | Disposition: A | Discharge status: Home / Self Care | Payer: No Typology Code available for payment source | Attending: Emergency Medicine | Admitting: Emergency Medicine

## 2010-10-26 DIAGNOSIS — I10 Essential (primary) hypertension: Secondary | ICD-10-CM | POA: Insufficient documentation

## 2010-10-26 DIAGNOSIS — E119 Type 2 diabetes mellitus without complications: Secondary | ICD-10-CM | POA: Insufficient documentation

## 2010-10-26 DIAGNOSIS — M79609 Pain in unspecified limb: Secondary | ICD-10-CM | POA: Insufficient documentation

## 2010-10-26 DIAGNOSIS — S139XXA Sprain of joints and ligaments of unspecified parts of neck, initial encounter: Secondary | ICD-10-CM | POA: Insufficient documentation

## 2010-10-26 DIAGNOSIS — K219 Gastro-esophageal reflux disease without esophagitis: Secondary | ICD-10-CM | POA: Insufficient documentation

## 2010-10-26 DIAGNOSIS — Z87891 Personal history of nicotine dependence: Secondary | ICD-10-CM | POA: Insufficient documentation

## 2011-02-25 ENCOUNTER — Ambulatory Visit (INDEPENDENT_AMBULATORY_CARE_PROVIDER_SITE_OTHER): Payer: BC Managed Care – PPO | Admitting: Family Medicine

## 2011-02-25 ENCOUNTER — Encounter: Payer: Self-pay | Admitting: Family Medicine

## 2011-02-25 VITALS — BP 138/80 | HR 115 | Resp 16 | Ht 62.0 in | Wt 302.0 lb

## 2011-02-25 DIAGNOSIS — Z5689 Other problems related to employment: Secondary | ICD-10-CM

## 2011-02-25 DIAGNOSIS — R12 Heartburn: Secondary | ICD-10-CM

## 2011-02-25 DIAGNOSIS — I10 Essential (primary) hypertension: Secondary | ICD-10-CM

## 2011-02-25 DIAGNOSIS — E669 Obesity, unspecified: Secondary | ICD-10-CM

## 2011-02-25 DIAGNOSIS — R079 Chest pain, unspecified: Secondary | ICD-10-CM

## 2011-02-25 DIAGNOSIS — Z566 Other physical and mental strain related to work: Secondary | ICD-10-CM

## 2011-02-25 MED ORDER — PANTOPRAZOLE SODIUM 40 MG PO TBEC: 40.0000 mg | DELAYED_RELEASE_TABLET | Freq: Every day | ORAL | Status: DC

## 2011-02-25 MED ORDER — ATENOLOL 25 MG PO TABS: 25.0000 mg | ORAL_TABLET | Freq: Every day | ORAL | Status: DC

## 2011-02-25 NOTE — Patient Instructions (Signed)
Continue your current medications Start the atenolol daily for blood pressure and the PVC I will send a referral for the dietician I will send a referral for a heart doctor for a stress test  If you have severe chest pain, with SOB, nausea, or sweating then go to the Hospital Schedule a Physical when you can.

## 2011-02-25 NOTE — Progress Notes (Signed)
  Subjective:    Patient ID: Monique Gomez, female    DOB: April 25, 1956, 55 y.o.   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 She was seen by cardiology- in 2010 in [place] had a cardiac Catherization that was negative.  When she gets a lot of stress has chest pain Chest pain feels like she has a gas pocket if she elevates breast off chest it alleviates, pain located in center of chest and over abdomen, occ radiates around the RUQ, Belches which helps pressure, Denies N/V,diaphoresis, no SOB, no tingling in arms/hands +smoker  No current chest pain, feeling has subsided since she took prilosec every day since Wed, would like a prescription PPI a little stronger, she has used prilosec for some time and occ does not get complete relief    HTN- Note she is not taking her blood pressure pill because it caused her heart rate to race and felt nausea, taking blood pressure at home stays systolic 130's.states her previous BB worked best for her  Review of Systems - per above   GEN- + fatigue,denies  fever, weight loss,weakness, recent illness CVS- + chest pain, palpitations RESP- denies SOB, cough, wheeze ABD- denies N/V, change in stools, abd pain GU- denies dysuria, hematuria, dribbling, incontinence MSK- denies joint pain, muscle aches, injury       Objective:   Physical Exam GEN- NAD, alert and oriented x3 HEENT- pink conjunctiva, oropharynx clear, MMM, PERRL, EOMI Neck- Supple, no thryomegaly CVS-tachycardic, no murmur, PVC RESP-CTAB EXT- No edema Pulses- Radial, DP- 2+   EKG- Sinus Tachy, HR 120's, PVC, inverted t wave in V 5, V6, no ST changes, no BBB     Assessment & Plan:

## 2011-02-27 DIAGNOSIS — R12 Heartburn: Secondary | ICD-10-CM | POA: Insufficient documentation

## 2011-02-27 DIAGNOSIS — K219 Gastro-esophageal reflux disease without esophagitis: Secondary | ICD-10-CM | POA: Insufficient documentation

## 2011-02-27 DIAGNOSIS — Z566 Other physical and mental strain related to work: Secondary | ICD-10-CM | POA: Insufficient documentation

## 2011-02-27 NOTE — Assessment & Plan Note (Signed)
Refer to dietician

## 2011-02-27 NOTE — Assessment & Plan Note (Signed)
Switch PPI to protonix

## 2011-02-27 NOTE — Assessment & Plan Note (Signed)
Pt not taking ACE combo, will place on Atenolol again- this will help BP and PVC seen

## 2011-02-27 NOTE — Assessment & Plan Note (Addendum)
Atypical chest pain, more GI etiology. Will change to protonix, EKG reassuring, she has had some work-up in the past. Pt does have risk factors for CAD, with obesity , DM, HTN, Hyperlipidemia, will send to cardiology for stress test evaluation  Her stress is also contributing to CP- she declines any medications or treatment for this.

## 2011-03-01 ENCOUNTER — Encounter: Payer: Self-pay | Admitting: Cardiology

## 2011-03-03 ENCOUNTER — Encounter: Payer: Self-pay | Admitting: Cardiology

## 2011-03-03 ENCOUNTER — Encounter: Payer: Self-pay | Admitting: Family Medicine

## 2011-03-03 ENCOUNTER — Ambulatory Visit (INDEPENDENT_AMBULATORY_CARE_PROVIDER_SITE_OTHER): Payer: BC Managed Care – PPO | Admitting: Cardiology

## 2011-03-03 DIAGNOSIS — R079 Chest pain, unspecified: Secondary | ICD-10-CM

## 2011-03-03 DIAGNOSIS — F17200 Nicotine dependence, unspecified, uncomplicated: Secondary | ICD-10-CM

## 2011-03-03 DIAGNOSIS — E785 Hyperlipidemia, unspecified: Secondary | ICD-10-CM

## 2011-03-03 DIAGNOSIS — E669 Obesity, unspecified: Secondary | ICD-10-CM

## 2011-03-03 DIAGNOSIS — Z720 Tobacco use: Secondary | ICD-10-CM | POA: Insufficient documentation

## 2011-03-03 DIAGNOSIS — I10 Essential (primary) hypertension: Secondary | ICD-10-CM

## 2011-03-03 NOTE — Assessment & Plan Note (Signed)
Have discussed weight loss, diet and sodium restriction. I doubt that atenolol by itself will be effective for optimal management of her blood pressure, which would be a systolic under 130. Actually, the prior regimen including ACE inhibitor and diuretic would probably be of most benefit, all things considered. She states that she felt "weak" on her prior regimen however. Could consider an ARB diuretic combination, or if not tolerated, a Norvasc diuretic combination. I asked her to maintain close followup with Dr. Lodema Hong.

## 2011-03-03 NOTE — Assessment & Plan Note (Signed)
We discussed this today, and she states that she quit smoking 9 days ago.

## 2011-03-03 NOTE — Assessment & Plan Note (Signed)
On statin therapy. Goal LDL should be under 100.

## 2011-03-03 NOTE — Assessment & Plan Note (Signed)
Weight loss indicated, and was discussed today.

## 2011-03-03 NOTE — Patient Instructions (Signed)
Your physician recommends that you continue on your current medications as directed. Please refer to the Current Medication list given to you today.  Your physician has requested that you have en exercise stress myoview. For further information please visit [email or web address] Please follow instruction sheet, as given.  Your physician recommends that you schedule a follow-up appointment in: we will contact you with results of test.

## 2011-03-03 NOTE — Assessment & Plan Note (Signed)
History of intermittent chest pain over the last few years with cardiac risk factors including obesity, hypertension, diabetes mellitus, tobacco abuse, and hyperlipidemia. Baseline ECG is nonspecific. She reports a previous cardiac catheterization from 2009 that was normal, records requested for review. In light of her current symptoms and her risk factor profile, plan is to proceed with a followup exercise echocardiogram, off atenolol, for further evaluation. We will inform her of the results. In addition, today we discussed risk factor modification strategies including weight loss, diet and exercise, and smoking cessation long-term. She seemed to be motivated in this regard.

## 2011-03-03 NOTE — Progress Notes (Signed)
Clinical Summary Monique Gomez is a 55 y.o.female referred for cardiology consultation by Dr. Lodema Hong. She reports a history of recurrent chest pain over the last few years, describing a cardiac catheterization done at a facility in [place] back in 2009, that was reportedly normal. She has had problems with hypertension and adequate blood pressure control, glucose control in the setting of obesity, and also tobacco abuse. She reports having a stressful job, and within the last month has been trying to make healthier lifestyle choices. She is trying to lose weight through diet, plans to start exercising regularly, and states that she stopped smoking cigarettes 9 days ago.  In reviewing her records, prior antihypertensives included ACE inhibitor with diuretic, recently changed to atenolol. Her blood pressure is elevated today, although she had not yet taken her medications this morning.  She describes recurrent chest pain symptoms, generally sharp, sometimes dull, mainly noted when she has had a stressful long day at work. Not specifically exertional.  Recent ECG was reviewed, showing sinus rhythm with nonspecific T-wave flattening, possible left atrial enlargement, nonspecific ST changes.   No Known Allergies  Medication list reviewed.  Past Medical History  Diagnosis Date  . Allergic rhinitis   . Obesity   . Hyperlipidemia   . Essential hypertension, benign   . History of migraine headaches   . Impaired fasting glucose     Past Surgical History  Procedure Date  . Right carpal tunnel release     Family History  Problem Relation Age of Onset  . Diabetes Mother   . Hypertension Mother   . Heart disease Mother   . Hypertension Brother     Social History Ms. Cullimore reports that she has been smoking Cigarettes.  She has been smoking about .3 packs per day. She has never used smokeless tobacco. Ms. Tidd reports that she does not drink alcohol.  Review of Systems No palpitations.  NYHA class II dyspnea on exertion. No syncope. Otherwise negative except as outlined.  Physical Examination Filed Vitals:   03/03/11 0921  BP: 180/107  Pulse: 73  Resp: 18   Morbidly obese woman in no acute distress, no active chest pain. HEENT: Conjunctiva and lids normal, oropharynx with moist mucosa. Neck: Supple, no elevated JVP or carotid bruits. Lungs: Clear to auscultation, nonlabored. Cardiac: Distant, regular heart sounds, no significant murmur or gallop. Abdomen: Obese, nontender, bowel sounds present. Skin: Warm and dry. Scattered tattoos. Musculoskeletal: No kyphosis. Extremities: No pitting edema, distal pulses one plus. Neuropsychiatric: Alert and oriented x3, moves all extremities equally, normal speech pattern.    Problem List and Plan

## 2011-03-10 ENCOUNTER — Ambulatory Visit (HOSPITAL_COMMUNITY)
Admission: RE | Admit: 2011-03-10 | Discharge: 2011-03-10 | Disposition: A | Discharge status: Home / Self Care | Payer: BC Managed Care – PPO | Source: Ambulatory Visit | Attending: Cardiology | Admitting: Cardiology

## 2011-03-10 ENCOUNTER — Encounter (HOSPITAL_COMMUNITY): Payer: Self-pay | Admitting: Cardiology

## 2011-03-10 DIAGNOSIS — E785 Hyperlipidemia, unspecified: Secondary | ICD-10-CM | POA: Insufficient documentation

## 2011-03-10 DIAGNOSIS — R079 Chest pain, unspecified: Secondary | ICD-10-CM

## 2011-03-10 DIAGNOSIS — I10 Essential (primary) hypertension: Secondary | ICD-10-CM | POA: Insufficient documentation

## 2011-03-10 DIAGNOSIS — F17200 Nicotine dependence, unspecified, uncomplicated: Secondary | ICD-10-CM | POA: Insufficient documentation

## 2011-03-10 DIAGNOSIS — R072 Precordial pain: Secondary | ICD-10-CM

## 2011-03-10 NOTE — Progress Notes (Signed)
*  PRELIMINARY RESULTS* Echocardiogram Echocardiogram Stress Test has been performed.  Conrad [place] 03/10/2011, 10:22 AM

## 2011-03-10 NOTE — Progress Notes (Signed)
Stress Lab Nurses Notes - Monique Gomez  Monique Gomez 03/10/2011  Reason for doing test: Chest Pain  Type of test: Stress Echo  Nurse performing test: Parke Poisson, RN  Nuclear Medicine Tech: Not Applicable  Echo Tech: Karrie Doffing  MD performing test: R. Rothbart  Family MD: Lodema Hong  Test explained and consent signed: yes  IV started: No IV started  Symptoms: SOB & fatigue in legs  Treatment/Intervention: None  Reason test stopped: reached target HR  After recovery IV was: NA  Patient to return to Nuc. Med at : NA  Patient discharged: Home  Patient's Condition upon discharge was: stable  Comments: During test peak BP 220/102 & HR 156.  Recovery BP 148/98 & HR 90.  Symptoms resolved in recovery.  [name]

## 2011-03-21 ENCOUNTER — Encounter: Payer: Self-pay | Admitting: Cardiology

## 2011-10-07 ENCOUNTER — Ambulatory Visit (INDEPENDENT_AMBULATORY_CARE_PROVIDER_SITE_OTHER): Payer: BC Managed Care – PPO | Admitting: Family Medicine

## 2011-10-07 ENCOUNTER — Encounter: Payer: Self-pay | Admitting: Family Medicine

## 2011-10-07 VITALS — BP 138/90 | HR 92 | Resp 18 | Ht 62.0 in | Wt 304.0 lb

## 2011-10-07 DIAGNOSIS — Z720 Tobacco use: Secondary | ICD-10-CM

## 2011-10-07 DIAGNOSIS — E669 Obesity, unspecified: Secondary | ICD-10-CM

## 2011-10-07 DIAGNOSIS — F17200 Nicotine dependence, unspecified, uncomplicated: Secondary | ICD-10-CM

## 2011-10-07 DIAGNOSIS — R7309 Other abnormal glucose: Secondary | ICD-10-CM

## 2011-10-07 DIAGNOSIS — E785 Hyperlipidemia, unspecified: Secondary | ICD-10-CM

## 2011-10-07 DIAGNOSIS — R12 Heartburn: Secondary | ICD-10-CM

## 2011-10-07 DIAGNOSIS — I10 Essential (primary) hypertension: Secondary | ICD-10-CM

## 2011-10-07 DIAGNOSIS — M654 Radial styloid tenosynovitis [de Quervain]: Secondary | ICD-10-CM

## 2011-10-07 MED ORDER — PANTOPRAZOLE SODIUM 40 MG PO TBEC: 40.0000 mg | DELAYED_RELEASE_TABLET | Freq: Every day | ORAL | Status: DC

## 2011-10-07 MED ORDER — PRAVASTATIN SODIUM 40 MG PO TABS: 40.0000 mg | ORAL_TABLET | Freq: Every evening | ORAL | Status: DC

## 2011-10-07 MED ORDER — METFORMIN HCL 500 MG PO TABS: ORAL_TABLET | ORAL | Status: DC

## 2011-10-07 MED ORDER — ATENOLOL 25 MG PO TABS: 25.0000 mg | ORAL_TABLET | Freq: Every day | ORAL | Status: DC

## 2011-10-07 MED ORDER — NAPROXEN 500 MG PO TABS: 500.0000 mg | ORAL_TABLET | Freq: Two times a day (BID) | ORAL | Status: DC

## 2011-10-07 NOTE — Patient Instructions (Signed)
Take the antiflammatory Medications refilled Labs to be done- we will call with results F/U 3 months for Physical (Dr.Simpson) Suzette Battiest Disease Suzette Battiest disease is a condition often seen in racquet sports where there is a soreness (inflammation) in the cord like structures (tendons) which attach muscle to bone on the thumb side of the wrist. There may be a tightening of the tissuesaround the tendons. This condition is often helped by giving up or modifying the activity which caused it. When conservative treatment does not help, surgery may be required. Conservative treatment could include changes in the activity which brought about the problem or made it worse. Anti-inflammatory medications and injections may be used to help decrease the inflammation and help with pain control. Your caregiver will help you determine which is best for you. DIAGNOSIS   Often the diagnosis (learning what is wrong) can be made by examination. Sometimes x-rays are required. HOME CARE INSTRUCTIONS    Apply ice to the sore area for 15 to 20 minutes, 3 to 4 times per day while awake. Put the ice in a plastic bag and place a towel between the bag of ice and your skin. This is especially helpful if it can be done after all activities involving the sore wrist.   Temporary splinting may help.   Only take over-the-counter or prescription medicines for pain, discomfort or fever as directed by your caregiver.  SEEK MEDICAL CARE IF:    Pain relief is not obtained with medications, or if you have increasing pain and seem to be getting worse rather than better.  MAKE SURE YOU:    Understand these instructions.   Will watch your condition.   Will get help right away if you are not doing well or get worse.  Document Released: 01/11/2001 Document Revised: 04/07/2011 Document Reviewed: 04/18/2005 [facility] Patient Information 2012 [place], [place].

## 2011-10-09 ENCOUNTER — Encounter: Payer: Self-pay | Admitting: Family Medicine

## 2011-10-09 DIAGNOSIS — M654 Radial styloid tenosynovitis [de Quervain]: Secondary | ICD-10-CM | POA: Insufficient documentation

## 2011-10-09 NOTE — Assessment & Plan Note (Signed)
Continue to work on diet and exercise

## 2011-10-09 NOTE — Assessment & Plan Note (Signed)
Pt has thumb spica brace at home , nsaids, , she is to use stress ball for ROM, or under warm water. If no improvement send to Ortho

## 2011-10-09 NOTE — Assessment & Plan Note (Signed)
Protonix refilled, well controlled

## 2011-10-09 NOTE — Progress Notes (Signed)
  Subjective:    Patient ID: Monique Gomez, female    DOB: 07-Oct-1955, 56 y.o.   MRN: [id number]  HPI Pt here for medication fills and left wrist pain. Medications reviewed. 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, +swelling in wrist, pain mostly at base of thumb, tried cold pack to area, no specific injury Smoking 1 cig/day because of "stress" She is exercising has lost 6lbs as she was 310lbs a few months ago Due for CPE  Review of Systems  GEN- denies fatigue, fever, + intentional weight loss,weakness, recent illness HEENT- denies eye drainage, change in vision, nasal discharge, CVS- denies chest pain, palpitations RESP- denies SOB, cough, wheeze ABD- denies N/V, change in stools, abd pain GU- denies dysuria, hematuria, dribbling, incontinence MSK- + joint pain, muscle aches, injury Neuro- denies headache, dizziness, syncope, seizure activity       Objective:   Physical Exam GEN- NAD, alert and oriented x3, obese HEENT- PERRL, EOMI, non injected sclera, pink conjunctiva, MMM, oropharynx clear Neck- Supple,  CVS- RRR, no murmur RESP-CTAB Wrist- left- TTP along thumb joint up to lateral aspect of wrist, no swelling,weak grasp, difficulty making fist without pain in thumb,+finklestein , decreased ROM at wrist secondary to pain EXT- No edema Pulses- Radial, DP- 2+        Assessment & Plan:

## 2011-10-09 NOTE — Assessment & Plan Note (Signed)
Check A1C, no recent CBG checks, adjust meds as needed

## 2011-10-09 NOTE — Assessment & Plan Note (Signed)
Check LDL, on statin therapy

## 2011-10-09 NOTE — Assessment & Plan Note (Signed)
Reiterated smoking cessation need

## 2011-10-09 NOTE — Assessment & Plan Note (Signed)
BP suboptimal today, she is pre-diabetic and would benefit from ACEI, labs to be obtained

## 2011-10-28 ENCOUNTER — Telehealth: Payer: Self-pay | Admitting: Family Medicine

## 2011-10-28 DIAGNOSIS — M25539 Pain in unspecified wrist: Secondary | ICD-10-CM

## 2011-10-28 DIAGNOSIS — M654 Radial styloid tenosynovitis [de Quervain]: Secondary | ICD-10-CM

## 2011-10-28 NOTE — Telephone Encounter (Signed)
Will refer to Orthopedics Please have pt where the wrist brace, probably best if she goes to urgent care, the ER will not inject her wrist. Orthopedic referral sent

## 2011-10-28 NOTE — Telephone Encounter (Signed)
Pt aware.

## 2011-10-28 NOTE — Telephone Encounter (Signed)
Called patient and left message for them to return call at the office   

## 2011-11-15 ENCOUNTER — Ambulatory Visit (INDEPENDENT_AMBULATORY_CARE_PROVIDER_SITE_OTHER): Payer: BC Managed Care – PPO

## 2011-11-15 ENCOUNTER — Ambulatory Visit (INDEPENDENT_AMBULATORY_CARE_PROVIDER_SITE_OTHER): Payer: BC Managed Care – PPO | Admitting: Orthopedic Surgery

## 2011-11-15 ENCOUNTER — Encounter: Payer: Self-pay | Admitting: Orthopedic Surgery

## 2011-11-15 VITALS — BP 100/70 | Ht 62.0 in | Wt 304.0 lb

## 2011-11-15 DIAGNOSIS — M25532 Pain in left wrist: Secondary | ICD-10-CM

## 2011-11-15 DIAGNOSIS — M25539 Pain in unspecified wrist: Secondary | ICD-10-CM

## 2011-11-15 DIAGNOSIS — M654 Radial styloid tenosynovitis [de Quervain]: Secondary | ICD-10-CM

## 2011-11-15 MED ORDER — DICLOFENAC POTASSIUM 50 MG PO TABS: 50.0000 mg | ORAL_TABLET | Freq: Two times a day (BID) | ORAL | Status: DC

## 2011-11-15 MED ORDER — HYDROCODONE-ACETAMINOPHEN 5-325 MG PO TABS: 1.0000 | ORAL_TABLET | Freq: Four times a day (QID) | ORAL | Status: AC | PRN

## 2011-11-15 NOTE — Patient Instructions (Addendum)
Wear Ryno splint Earlean Shawl) for 6 weeks PROCARE DJO COMFORT FORM WRIST THUMB FOR DEQUERVAINS SYNDROME   Start NEW ANTIINFLAMMATORY, STOP NAPROXYN  CONTINUE BIOFREEZE 3 TIMES A DAY  ICE 3 TIMES A DAY

## 2011-11-15 NOTE — Progress Notes (Signed)
  Subjective:    Patient ID: Monique Gomez, female    DOB: 07/16/1955, 56 y.o.   MRN: [id number]  Wrist Pain  The pain is present in the left wrist. This is a new problem. The current episode started 1 to 4 weeks ago. There has been no history of extremity trauma. The problem occurs constantly (gradual onset). The problem has been unchanged. The quality of the pain is described as burning and aching (throbbing). The pain is at a severity of 10/10. Associated symptoms include numbness and tingling. Pertinent negatives include no fever, itching, joint locking, joint swelling, limited range of motion or stiffness. Associated symptoms comments: Swelling .      Review of Systems  Constitutional: Negative for fever.  Gastrointestinal:       Heartburn   Musculoskeletal: Negative for stiffness.  Skin: Negative for itching.  Neurological: Positive for tingling and numbness.  All other systems reviewed and are negative.  seasonal allergies      Objective:   Physical Exam  Nursing note and vitals reviewed. Constitutional: She is oriented to person, place, and time. She appears well-developed and well-nourished.  HENT:  Head: Normocephalic.  Cardiovascular: Intact distal pulses.   Pulmonary/Chest: Effort normal.  Abdominal: She exhibits no distension.  Musculoskeletal:       Evaluation of the left upper extremity. There is mild swelling over the first extensor compartment. Painful ulnar deviation wrist flexion wrist extension. Joint stability normal. Strength flexor tendon normal extensor tendon normal skin intact  Positive Finkelstein's maneuver.  Ambulation normal  Negative epitrochlear lymph nodes  Normal sensation  Neurological: She is alert and oriented to person, place, and time. She has normal reflexes. She exhibits normal muscle tone. Coordination normal.  Skin: Skin is warm and dry.  Psychiatric: She has a normal mood and affect. Her behavior is normal. Judgment and  thought content normal.          Assessment & Plan:  De Quervain's syndrome, LEFT wrist.  Recommend thumb immobilizer 6 weeks continue topical agents and change anti-inflammatory, okay to use Norco for pain 3 refills 60 tablet prescription.  Followup in 6 weeks

## 2011-12-27 ENCOUNTER — Encounter: Payer: Self-pay | Admitting: Orthopedic Surgery

## 2011-12-27 ENCOUNTER — Ambulatory Visit (INDEPENDENT_AMBULATORY_CARE_PROVIDER_SITE_OTHER): Payer: BC Managed Care – PPO | Admitting: Orthopedic Surgery

## 2011-12-27 VITALS — BP 130/80 | Ht 62.0 in | Wt 305.0 lb

## 2011-12-27 DIAGNOSIS — G5602 Carpal tunnel syndrome, left upper limb: Secondary | ICD-10-CM

## 2011-12-27 DIAGNOSIS — M654 Radial styloid tenosynovitis [de Quervain]: Secondary | ICD-10-CM

## 2011-12-27 DIAGNOSIS — G5600 Carpal tunnel syndrome, unspecified upper limb: Secondary | ICD-10-CM

## 2011-12-27 DIAGNOSIS — G5603 Carpal tunnel syndrome, bilateral upper limbs: Secondary | ICD-10-CM | POA: Insufficient documentation

## 2011-12-27 NOTE — Patient Instructions (Signed)
Nerve study ordered for carpal tunnel

## 2011-12-27 NOTE — Progress Notes (Signed)
Patient ID: Monique Gomez, female   DOB: [birth year], 56 y.o.   MRN: [id number] Chief Complaint  Patient presents with  . Follow-up    6 week recheck Left thumb, patient reports pain is worse    BP 130/80  Ht 5\' 2"  (1.575 m)  Wt 305 lb (138.347 kg)  BMI 55.79 kg/m2  Followup after treatment for de Quervain's syndrome with a thumb immobilizer and topical bio freeze.  No improvement symptoms are worsening. The brace seems to be bothering her and she is developing numbness and tingling in the fingers of the left hand.  Review of systems otherwise normal  Exam shows tenderness over the first extensor compartment painful ulnar deviation positive Finkelstein's test. Wrist stable. Carpal tunnel nontender. Strength normal skin intact pulse and color of the hand are normal sensory changes not clinically detectable  Recommend nerve conduction study  Inject the first extensor compartment. Removed metal bar from brace continue bracing  Followup after nerve study  Injection for de Quervain's syndrome left thumb Verbal consent Timeout completed Medications Depo-Medrol 40 mg                     Lidocaine 1% 3 cc  Under sterile conditions with topical refrigerant for anesthesia the first extensor compartment was injected  There were no complications

## 2012-01-06 ENCOUNTER — Encounter: Payer: BC Managed Care – PPO | Admitting: Family Medicine

## 2012-03-20 ENCOUNTER — Other Ambulatory Visit: Payer: Self-pay | Admitting: Obstetrics & Gynecology

## 2012-03-20 ENCOUNTER — Other Ambulatory Visit (HOSPITAL_COMMUNITY)
Admission: RE | Admit: 2012-03-20 | Discharge: 2012-03-20 | Disposition: A | Discharge status: Home / Self Care | Payer: BC Managed Care – PPO | Source: Ambulatory Visit | Attending: Obstetrics & Gynecology | Admitting: Obstetrics & Gynecology

## 2012-03-20 DIAGNOSIS — Z1151 Encounter for screening for human papillomavirus (HPV): Secondary | ICD-10-CM | POA: Insufficient documentation

## 2012-03-20 DIAGNOSIS — Z139 Encounter for screening, unspecified: Secondary | ICD-10-CM

## 2012-03-20 DIAGNOSIS — Z01419 Encounter for gynecological examination (general) (routine) without abnormal findings: Secondary | ICD-10-CM | POA: Insufficient documentation

## 2012-03-23 ENCOUNTER — Ambulatory Visit (HOSPITAL_COMMUNITY)
Admission: RE | Admit: 2012-03-23 | Discharge: 2012-03-23 | Disposition: A | Discharge status: Home / Self Care | Payer: BC Managed Care – PPO | Source: Ambulatory Visit | Attending: Obstetrics & Gynecology | Admitting: Obstetrics & Gynecology

## 2012-03-23 DIAGNOSIS — Z139 Encounter for screening, unspecified: Secondary | ICD-10-CM

## 2012-03-23 DIAGNOSIS — Z1231 Encounter for screening mammogram for malignant neoplasm of breast: Secondary | ICD-10-CM | POA: Insufficient documentation

## 2012-06-04 ENCOUNTER — Emergency Department (HOSPITAL_COMMUNITY)
Admission: EM | Admit: 2012-06-04 | Discharge: 2012-06-04 | Disposition: A | Discharge status: Home / Self Care | Payer: BC Managed Care – PPO | Attending: Emergency Medicine | Admitting: Emergency Medicine

## 2012-06-04 ENCOUNTER — Encounter (HOSPITAL_COMMUNITY): Payer: Self-pay

## 2012-06-04 ENCOUNTER — Emergency Department (HOSPITAL_COMMUNITY): Payer: BC Managed Care – PPO

## 2012-06-04 DIAGNOSIS — R0602 Shortness of breath: Secondary | ICD-10-CM | POA: Insufficient documentation

## 2012-06-04 DIAGNOSIS — R12 Heartburn: Secondary | ICD-10-CM | POA: Insufficient documentation

## 2012-06-04 DIAGNOSIS — E785 Hyperlipidemia, unspecified: Secondary | ICD-10-CM | POA: Insufficient documentation

## 2012-06-04 DIAGNOSIS — R209 Unspecified disturbances of skin sensation: Secondary | ICD-10-CM | POA: Insufficient documentation

## 2012-06-04 DIAGNOSIS — R079 Chest pain, unspecified: Secondary | ICD-10-CM

## 2012-06-04 DIAGNOSIS — Z79899 Other long term (current) drug therapy: Secondary | ICD-10-CM | POA: Insufficient documentation

## 2012-06-04 DIAGNOSIS — Z87891 Personal history of nicotine dependence: Secondary | ICD-10-CM | POA: Insufficient documentation

## 2012-06-04 DIAGNOSIS — E669 Obesity, unspecified: Secondary | ICD-10-CM | POA: Insufficient documentation

## 2012-06-04 DIAGNOSIS — IMO0002 Reserved for concepts with insufficient information to code with codable children: Secondary | ICD-10-CM | POA: Insufficient documentation

## 2012-06-04 DIAGNOSIS — E119 Type 2 diabetes mellitus without complications: Secondary | ICD-10-CM | POA: Insufficient documentation

## 2012-06-04 DIAGNOSIS — Z862 Personal history of diseases of the blood and blood-forming organs and certain disorders involving the immune mechanism: Secondary | ICD-10-CM | POA: Insufficient documentation

## 2012-06-04 DIAGNOSIS — Z8679 Personal history of other diseases of the circulatory system: Secondary | ICD-10-CM | POA: Insufficient documentation

## 2012-06-04 DIAGNOSIS — I10 Essential (primary) hypertension: Secondary | ICD-10-CM | POA: Insufficient documentation

## 2012-06-04 LAB — BASIC METABOLIC PANEL
BUN: 13 mg/dL (ref 6–23)
Chloride: 103 mEq/L (ref 96–112)
Creatinine, Ser: 0.78 mg/dL (ref 0.50–1.10)
GFR calc Af Amer: >90 mL/min (ref >90)
Glucose, Bld: 119 mg/dL — ABNORMAL HIGH (ref 70–99)
Potassium: 3.7 mEq/L (ref 3.5–5.1)

## 2012-06-04 LAB — CBC WITH DIFFERENTIAL/PLATELET
Basophils Relative: 0 % (ref 0–1)
Eosinophils Absolute: 0.4 10*3/uL (ref 0.0–0.7)
HCT: 37.7 % (ref 36.0–46.0)
Hemoglobin: 11.6 g/dL — ABNORMAL LOW (ref 12.0–15.0)
Lymphs Abs: 2.1 10*3/uL (ref 0.7–4.0)
MCH: 20 pg — ABNORMAL LOW (ref 26.0–34.0)
MCHC: 30.8 g/dL (ref 30.0–36.0)
Monocytes Absolute: 0.5 10*3/uL (ref 0.1–1.0)
Monocytes Relative: 5 % (ref 3–12)
Neutro Abs: 6 10*3/uL (ref 1.7–7.7)
RBC: 5.8 MIL/uL — ABNORMAL HIGH (ref 3.87–5.11)

## 2012-06-04 LAB — HEPATIC FUNCTION PANEL
ALT: 8 U/L (ref 0–35)
Albumin: 3.4 g/dL — ABNORMAL LOW (ref 3.5–5.2)
Alkaline Phosphatase: 81 U/L (ref 39–117)
Total Bilirubin: 0.1 mg/dL — ABNORMAL LOW (ref 0.3–1.2)
Total Protein: 7.8 g/dL (ref 6.0–8.3)

## 2012-06-04 MED ORDER — TRAMADOL HCL 50 MG PO TABS: 50.0000 mg | ORAL_TABLET | Freq: Four times a day (QID) | ORAL | Status: DC | PRN

## 2012-06-04 MED ORDER — MORPHINE SULFATE 2 MG/ML IJ SOLN
2.0000 mg | Freq: Once | INTRAMUSCULAR | Status: AC
  Administered 2012-06-04: 2 mg via INTRAVENOUS
  Filled 2012-06-04: qty 1

## 2012-06-04 NOTE — ED Provider Notes (Signed)
History  This chart was scribed for Monique Lennert, MD by Ardeen Jourdain, ED Scribe. This patient was seen in room APA08/APA08 and the patient's care was started at 0957.  CSN: [id number]  Arrival date & time 06/04/12  0941   First MD Initiated Contact with Patient 06/04/12 0957      Chief Complaint  Patient presents with  . Chest Pain     Patient is a 57 y.o. female presenting with chest pain. The history is provided by the patient. No language interpreter was used.  Chest Pain The chest pain began 5 - 7 days ago. Chest pain occurs intermittently. The chest pain is worsening. The pain is associated with eating. The severity of the pain is mild. The quality of the pain is described as pressure-like and heavy. The pain radiates to the left arm. Chest pain is worsened by eating. Primary symptoms include shortness of breath. Pertinent negatives for primary symptoms include no fever, no fatigue, no cough, no palpitations, no abdominal pain, no nausea and no vomiting.  The shortness of breath began more than 2 days ago. The shortness of breath developed gradually. The shortness of breath is mild.  Pertinent negatives for associated symptoms include no diaphoresis, no numbness and no weakness.  Pertinent negatives for past medical history include no seizures.     Monique Gomez is a 57 y.o. female who presents to the Emergency Department complaining of CP with associated SOB. She states she has had one episode of similar symptoms a few years ago. She states she received a stress at that time. She reports the results of the stress test was normal.    Past Medical History  Diagnosis Date  . Allergic rhinitis   . Obesity   . Hyperlipidemia   . Essential hypertension, benign   . History of migraine headaches   . Impaired fasting glucose     Past Surgical History  Procedure Date  . Right carpal tunnel release     Family History  Problem Relation Age of Onset  . Diabetes Mother    . Hypertension Mother   . Heart disease Mother   . Hypertension Brother     History  Substance Use Topics  . Smoking status: Former Smoker -- 0.3 packs/day    Types: Cigarettes  . Smokeless tobacco: Never Used  . Alcohol Use: No   No OB history available.   Review of Systems  Constitutional: Negative for fever, diaphoresis and fatigue.  HENT: Negative for congestion, sinus pressure and ear discharge.   Eyes: Negative for discharge.  Respiratory: Positive for shortness of breath. Negative for cough.   Cardiovascular: Positive for chest pain. Negative for palpitations.  Gastrointestinal: Negative for nausea, vomiting, abdominal pain and diarrhea.  Genitourinary: Negative for frequency and hematuria.  Musculoskeletal: Negative for back pain.  Skin: Negative for rash.  Neurological: Negative for seizures, weakness, numbness and headaches.  Hematological: Negative.   Psychiatric/Behavioral: Negative for hallucinations.  All other systems reviewed and are negative.    Allergies  Review of patient's allergies indicates no known allergies.  Home Medications   Current Outpatient Rx  Name  Route  Sig  Dispense  Refill  . ATENOLOL 25 MG PO TABS   Oral   Take 1 tablet (25 mg total) by mouth daily.   90 tablet   1   . DICLOFENAC POTASSIUM 50 MG PO TABS   Oral   Take 1 tablet (50 mg total) by mouth 2 (two) times daily.  60 tablet   3   . HYDROCORTISONE ACETATE 25 MG RE SUPP               . METFORMIN HCL 500 MG PO TABS      Take 1 in am and 2 in pm with meals   270 tablet   1   . NAPROXEN 500 MG PO TABS   Oral   Take 1 tablet (500 mg total) by mouth 2 (two) times daily with a meal.   60 tablet   1   . ONETOUCH DELICA LANCETS MISC      Use with one touch meter once daily DX 250.00   100 each   12   . PANTOPRAZOLE SODIUM 40 MG PO TBEC   Oral   Take 1 tablet (40 mg total) by mouth daily.   90 tablet   1   . PRAVASTATIN SODIUM 40 MG PO TABS   Oral    Take 1 tablet (40 mg total) by mouth every evening.   90 tablet   1     Triage Vitals: BP 165/115  Pulse 107  Temp 98.2 F (36.8 C) (Oral)  Resp 20  Ht 5\' 2"  (1.575 m)  Wt 304 lb (137.893 kg)  BMI 55.60 kg/m2  SpO2 97%  Physical Exam  Nursing note and vitals reviewed. Constitutional: She is oriented to person, place, and time. She appears well-developed and well-nourished. No distress.       Obese   HENT:  Head: Normocephalic and atraumatic.  Eyes: Conjunctivae normal and EOM are normal. No scleral icterus.  Neck: Neck supple. No thyromegaly present.  Cardiovascular: Normal rate, regular rhythm and normal heart sounds.  Exam reveals no gallop and no friction rub.   No murmur heard. Pulmonary/Chest: Effort normal and breath sounds normal. No stridor. No respiratory distress. She has no wheezes. She has no rales. She exhibits no tenderness.  Abdominal: Soft. Bowel sounds are normal. She exhibits no distension. There is no tenderness. There is no rebound.  Musculoskeletal: Normal range of motion. She exhibits no edema.  Lymphadenopathy:    She has no cervical adenopathy.  Neurological: She is oriented to person, place, and time. Coordination normal.  Skin: Skin is warm and dry. No rash noted. No erythema.  Psychiatric: She has a normal mood and affect. Her behavior is normal.    ED Course  Procedures (including critical care time)  DIAGNOSTIC STUDIES: Oxygen Saturation is 97% on room air, normal by my interpretation.    COORDINATION OF CARE:  10:12 AM: Discussed treatment plan which includes CBC, BMP, Troponin, EKG, pain medication and CXR with pt at bedside and pt agreed to plan.    Results for orders placed during the hospital encounter of 06/04/12  CBC WITH DIFFERENTIAL      Component Value Range   WBC 9.0  4.0 - 10.5 K/uL   RBC 5.80 (*) 3.87 - 5.11 MIL/uL   Hemoglobin 11.6 (*) 12.0 - 15.0 g/dL   HCT 16.1  09.6 - 04.5 %   MCV 65.0 (*) 78.0 - 100.0 fL   MCH 20.0  (*) 26.0 - 34.0 pg   MCHC 30.8  30.0 - 36.0 g/dL   RDW 40.9 (*) 81.1 - 91.4 %   Platelets 302  150 - 400 K/uL   Neutrophils Relative 67  43 - 77 %   Neutro Abs 6.0  1.7 - 7.7 K/uL   Lymphocytes Relative 23  12 - 46 %   Lymphs  Abs 2.1  0.7 - 4.0 K/uL   Monocytes Relative 5  3 - 12 %   Monocytes Absolute 0.5  0.1 - 1.0 K/uL   Eosinophils Relative 5  0 - 5 %   Eosinophils Absolute 0.4  0.0 - 0.7 K/uL   Basophils Relative 0  0 - 1 %   Basophils Absolute 0.0  0.0 - 0.1 K/uL  BASIC METABOLIC PANEL      Component Value Range   Sodium 139  135 - 145 mEq/L   Potassium 3.7  3.5 - 5.1 mEq/L   Chloride 103  96 - 112 mEq/L   CO2 27  19 - 32 mEq/L   Glucose, Bld 119 (*) 70 - 99 mg/dL   BUN 13  6 - 23 mg/dL   Creatinine, Ser 1.61  0.50 - 1.10 mg/dL   Calcium 9.3  8.4 - 09.6 mg/dL   GFR calc non Af Amer >90  >90 mL/min   GFR calc Af Amer >90  >90 mL/min  TROPONIN I      Component Value Range   Troponin I <0.30  <0.30 ng/mL  D-DIMER, QUANTITATIVE      Component Value Range   D-Dimer, Quant 0.34  0.00 - 0.48 ug/mL-FEU  HEPATIC FUNCTION PANEL      Component Value Range   Total Protein 7.8  6.0 - 8.3 g/dL   Albumin 3.4 (*) 3.5 - 5.2 g/dL   AST 11  0 - 37 U/L   ALT 8  0 - 35 U/L   Alkaline Phosphatase 81  39 - 117 U/L   Total Bilirubin 0.1 (*) 0.3 - 1.2 mg/dL   Bilirubin, Direct <0.4  0.0 - 0.3 mg/dL   Indirect Bilirubin NOT CALCULATED  0.3 - 0.9 mg/dL   Dg Chest 2 View  09/03/979  *RADIOLOGY REPORT*  Clinical Data: chest pain  CHEST - 2 VIEW  Comparison: 02/11/2010  Findings: No edema or focal airspace consolidation.  Probable subsegmental atelectasis or linear scar at the left base.  No pleural effusion. The cardiopericardial silhouette is enlarged. Imaged bony structures of the thorax are intact.  IMPRESSION: Cardiomegaly with left base atelectasis or scarring.  No edema or focal pneumonia.   Original Report Authenticated By: Kennith Center, M.D.       No diagnosis found.  Date:  06/04/2012  Rate: 97  Rhythm: normal sinus rhythm  QRS Axis: normal  Intervals: normal  ST/T Wave abnormalities: nonspecific ST changes  Conduction Disutrbances:none  Narrative Interpretation:   Old EKG Reviewed: unchanged     MDM       The chart was scribed for me under my direct supervision.  I personally performed the history, physical, and medical decision making and all procedures in the evaluation of this patient.Monique Lennert, MD 06/04/12 1330

## 2012-06-04 NOTE — ED Notes (Signed)
Patient states she does not need anything at this time. 

## 2012-06-04 NOTE — ED Notes (Signed)
Pt c/o pressure in chest and tingling in left arm off and on since Wednesday.  Reports eating seems to make the heaviness worse and causes heart burn.  Also c/o SOB.

## 2012-06-05 ENCOUNTER — Ambulatory Visit (INDEPENDENT_AMBULATORY_CARE_PROVIDER_SITE_OTHER): Payer: BC Managed Care – PPO | Admitting: Family Medicine

## 2012-06-05 ENCOUNTER — Encounter: Payer: Self-pay | Admitting: Family Medicine

## 2012-06-05 VITALS — BP 144/98 | HR 71 | Resp 16 | Ht 62.0 in | Wt 308.0 lb

## 2012-06-05 DIAGNOSIS — I10 Essential (primary) hypertension: Secondary | ICD-10-CM

## 2012-06-05 DIAGNOSIS — M654 Radial styloid tenosynovitis [de Quervain]: Secondary | ICD-10-CM

## 2012-06-05 DIAGNOSIS — E669 Obesity, unspecified: Secondary | ICD-10-CM

## 2012-06-05 DIAGNOSIS — E785 Hyperlipidemia, unspecified: Secondary | ICD-10-CM

## 2012-06-05 DIAGNOSIS — F17200 Nicotine dependence, unspecified, uncomplicated: Secondary | ICD-10-CM

## 2012-06-05 DIAGNOSIS — D649 Anemia, unspecified: Secondary | ICD-10-CM

## 2012-06-05 DIAGNOSIS — E119 Type 2 diabetes mellitus without complications: Secondary | ICD-10-CM

## 2012-06-05 DIAGNOSIS — R12 Heartburn: Secondary | ICD-10-CM

## 2012-06-05 DIAGNOSIS — R0789 Other chest pain: Secondary | ICD-10-CM

## 2012-06-05 DIAGNOSIS — Z720 Tobacco use: Secondary | ICD-10-CM

## 2012-06-05 MED ORDER — PANTOPRAZOLE SODIUM 40 MG PO TBEC: 40.0000 mg | DELAYED_RELEASE_TABLET | Freq: Every day | ORAL | Status: DC

## 2012-06-05 MED ORDER — ATENOLOL 25 MG PO TABS: 25.0000 mg | ORAL_TABLET | Freq: Every day | ORAL | Status: DC

## 2012-06-05 MED ORDER — LISINOPRIL 10 MG PO TABS: 10.0000 mg | ORAL_TABLET | Freq: Every day | ORAL | Status: DC

## 2012-06-05 NOTE — Patient Instructions (Signed)
Start blood pressure medications- atenolol and lisinopril- you can take both at night I will call about the dose of the diabetes medicine Continue to take blood sugars fasting - and record Continue aleve twice a day  Congratulations on the smoking! F/U 4 weeks

## 2012-06-05 NOTE — Progress Notes (Signed)
  Subjective:    Patient ID: Monique Gomez, female    DOB: 1955/08/18, 57 y.o.   MRN: [id number]  HPI Patient here to followup emergency room visit for chest pain told it was likely stress and possible acid reflux-related therefore Protonix was increased to twice a day and her symptoms resolved. She's been having chest pain substernal with tingling in her hand for the past 3 weeks. She has had negative stress Myoview in 2012 as well as negative cardiac catheterization in the past. Labs were unremarkable Unfortunately she has not been seen since May and has been taking blood pressure medication and diabetes medicines sparingly. She is overdue for labs   Review of Systems   GEN- denies fatigue, fever, weight loss,weakness, recent illness HEENT- denies eye drainage, change in vision, nasal discharge, CVS- denies chest pain, palpitations RESP- denies SOB, cough, wheeze ABD- denies N/V, change in stools, abd pain GU- denies dysuria, hematuria, dribbling, incontinence MSK- denies joint pain, muscle aches, injury Neuro- denies headache, dizziness, syncope, seizure activity      Objective:   Physical Exam GEN- NAD, alert and oriented x3, obese HEENT- PERRL, EOMI, non injected sclera, pink conjunctiva, MMM, oropharynx clear Neck- Supple, no thryomegaly CVS- RRR, no murmur RESP-CTAB ABD-NABS,soft,NT,ND EXT- No edema Pulses- Radial, DP- 2+ Diabetic foot- see below        Assessment & Plan:

## 2012-06-06 ENCOUNTER — Encounter: Payer: Self-pay | Admitting: Family Medicine

## 2012-06-06 DIAGNOSIS — E1169 Type 2 diabetes mellitus with other specified complication: Secondary | ICD-10-CM | POA: Insufficient documentation

## 2012-06-06 DIAGNOSIS — E119 Type 2 diabetes mellitus without complications: Secondary | ICD-10-CM | POA: Insufficient documentation

## 2012-06-06 NOTE — Assessment & Plan Note (Signed)
No weight loss since last visit, discussed need for diet and exercise

## 2012-06-06 NOTE — Assessment & Plan Note (Signed)
She has had recurrent symptoms, advised restart NSAIDS, call ortho if needed

## 2012-06-06 NOTE — Assessment & Plan Note (Signed)
Discussed foods to avoid, PPI increased for next 2 weeks, then return to once a day

## 2012-06-06 NOTE — Assessment & Plan Note (Signed)
Very mild, HB stable

## 2012-06-06 NOTE — Assessment & Plan Note (Signed)
Check FLP will start statin pending results

## 2012-06-06 NOTE — Assessment & Plan Note (Signed)
EKG, labs, CXR unremarkable, improved with PPI

## 2012-06-06 NOTE — Assessment & Plan Note (Signed)
Uncontrolled, add lisinopril with atenolol

## 2012-06-06 NOTE — Assessment & Plan Note (Addendum)
She has not been taking meds, recheck A1C, will then decide on dose of meds, no CBG with her Discussed importance of following up with meds and health, getting labs, she agreed to participate fully in healthcare

## 2012-06-06 NOTE — Assessment & Plan Note (Signed)
She continues to cut back  

## 2012-06-08 ENCOUNTER — Encounter: Payer: Self-pay | Admitting: Family Medicine

## 2012-06-08 ENCOUNTER — Ambulatory Visit (INDEPENDENT_AMBULATORY_CARE_PROVIDER_SITE_OTHER): Payer: BC Managed Care – PPO | Admitting: Family Medicine

## 2012-06-08 VITALS — BP 122/88 | HR 98 | Temp 98.8°F | Resp 16 | Ht 62.0 in | Wt 301.0 lb

## 2012-06-08 DIAGNOSIS — A084 Viral intestinal infection, unspecified: Secondary | ICD-10-CM | POA: Insufficient documentation

## 2012-06-08 DIAGNOSIS — A088 Other specified intestinal infections: Secondary | ICD-10-CM

## 2012-06-08 MED ORDER — PROMETHAZINE HCL 12.5 MG PO TABS: 12.5000 mg | ORAL_TABLET | Freq: Four times a day (QID) | ORAL | Status: DC | PRN

## 2012-06-08 NOTE — Assessment & Plan Note (Addendum)
Positive sick contact with her son who also had diarrheal illness her exam is benign today she has no fever will start her on Imodium and give her Phenergan tablets discussed BRAT diet. Push Fluids Given work note Discussed red flags  Note she had her fasting labs that we've ordered on Tuesday drawn today

## 2012-06-08 NOTE — Progress Notes (Signed)
  Subjective:    Patient ID: Monique Gomez, female    DOB: February 19, 1956, 57 y.o.   MRN: [id number]  HPI  Patient presents with diarrhea nausea vomiting since Tuesday night. She said all of a sudden after her visit she began having loose watery stools throughout the night. She was unable to keep down any solid food. She has been drinking water and ginger ale. She took an antiemetic pill from a friend of first yesterday which helped with the vomiting but she still nauseous. She denies any fever chest pain shortness of breath. She's not taking any other over-the-counter medications. She's afraid to eat because of the diarrhea she did have blood streaked emesis yesterday morning  Review of Systems  GEN- denies fatigue, fever, weight loss,weakness, recent illness HEENT- denies eye drainage, change in vision, nasal discharge, CVS- denies chest pain, palpitations RESP- denies SOB, cough, wheeze ABD- +N/V, +change in stools, +abd pain GU- denies dysuria, hematuria, dribbling, incontinence Neuro- denies headache, dizziness, syncope, seizure activity      Objective:   Physical Exam GEN- NAD, alert and oriented x3, sick appearing, +weight loss HEENT- PERRL, EOMI, non injected sclera, pink conjunctiva, slightly dry MM, oropharynx clear CVS- RRR, no murmur RESP-CTAB ABD-NABS,soft,NT,ND Pulses- Radial 2+        Assessment & Plan:

## 2012-06-08 NOTE — Patient Instructions (Signed)
Phenergan for nausea Imodium for diarrhea Viral Gastroenteritis Viral gastroenteritis is also called stomach flu. This illness is caused by a certain type of germ (virus). It can cause sudden watery poop (diarrhea) and throwing up (vomiting). This can cause you to lose body fluids (dehydration). This illness usually lasts for 3 to 8 days. It usually goes away on its own. HOME CARE    Drink enough fluids to keep your pee (urine) clear or pale yellow. Drink small amounts of fluids often.   Ask your doctor how to replace body fluid losses (rehydration).   Avoid:   Foods high in sugar.   Alcohol.   Bubbly (carbonated) drinks.   Tobacco.   Juice.   Caffeine drinks.   Very hot or cold fluids.   Fatty, greasy foods.   Eating too much at one time.   Dairy products until 24 to 48 hours after your watery poop stops.   You may eat foods with active cultures (probiotics). They can be found in some yogurts and supplements.   Wash your hands well to avoid spreading the illness.   Only take medicines as told by your doctor. Do not give aspirin to children. Do not take medicines for watery poop (antidiarrheals).   Ask your doctor if you should keep taking your regular medicines.   Keep all doctor visits as told.  GET HELP RIGHT AWAY IF:    You cannot keep fluids down.   You do not pee at least once every 6 to 8 hours.   You are short of breath.   You see blood in your poop or throw up. This may look like coffee grounds.   You have belly (abdominal) pain that gets worse or is just in one small spot (localized).   You keep throwing up or having watery poop.   You have a fever.   The patient is a child younger than 3 months, and he or she has a fever.   The patient is a child older than 3 months, and he or she has a fever and problems that do not go away.   The patient is a child older than 3 months, and he or she has a fever and problems that suddenly get worse.   The  patient is a baby, and he or she has no tears when crying.  MAKE SURE YOU:    Understand these instructions.   Will watch your condition.   Will get help right away if you are not doing well or get worse.  Document Released: 10/05/2007 Document Revised: 07/11/2011 Document Reviewed: 02/02/2011 [facility] Patient Information 2013 [place], [place].

## 2012-06-09 LAB — LIPID PANEL
HDL: 25 mg/dL — ABNORMAL LOW (ref >39)
LDL Cholesterol: 135 mg/dL — ABNORMAL HIGH (ref 0–99)
Total CHOL/HDL Ratio: 7.5 Ratio
Triglycerides: 138 mg/dL (ref <150)
VLDL: 28 mg/dL (ref 0–40)

## 2012-06-09 LAB — HEMOGLOBIN A1C: Hgb A1c MFr Bld: 6.6 % — ABNORMAL HIGH (ref <5.7)

## 2012-06-09 LAB — TSH: TSH: 4.145 u[IU]/mL (ref 0.350–4.500)

## 2012-06-11 MED ORDER — METFORMIN HCL ER 500 MG PO TB24: 500.0000 mg | ORAL_TABLET | Freq: Every day | ORAL | Status: DC

## 2012-06-11 MED ORDER — SIMVASTATIN 20 MG PO TABS: 20.0000 mg | ORAL_TABLET | Freq: Every evening | ORAL | Status: DC

## 2012-06-11 NOTE — Addendum Note (Signed)
Addended by: Milinda Antis F on: 06/11/2012 12:51 PM   Modules accepted: Orders

## 2012-07-06 ENCOUNTER — Ambulatory Visit: Payer: BC Managed Care – PPO | Admitting: Family Medicine

## 2012-10-29 ENCOUNTER — Telehealth: Payer: Self-pay | Admitting: Family Medicine

## 2012-10-30 NOTE — Telephone Encounter (Signed)
Called pt and wanted to make appt to see Dr. Jeanice Lim

## 2013-02-04 ENCOUNTER — Ambulatory Visit (INDEPENDENT_AMBULATORY_CARE_PROVIDER_SITE_OTHER): Payer: BC Managed Care – PPO | Admitting: Family Medicine

## 2013-02-04 ENCOUNTER — Encounter: Payer: Self-pay | Admitting: Family Medicine

## 2013-02-04 VITALS — BP 140/88 | HR 82 | Temp 97.4°F | Resp 18 | Wt 296.0 lb

## 2013-02-04 DIAGNOSIS — F329 Major depressive disorder, single episode, unspecified: Secondary | ICD-10-CM

## 2013-02-04 DIAGNOSIS — F411 Generalized anxiety disorder: Secondary | ICD-10-CM | POA: Insufficient documentation

## 2013-02-04 DIAGNOSIS — I10 Essential (primary) hypertension: Secondary | ICD-10-CM

## 2013-02-04 DIAGNOSIS — R0789 Other chest pain: Secondary | ICD-10-CM

## 2013-02-04 DIAGNOSIS — E785 Hyperlipidemia, unspecified: Secondary | ICD-10-CM

## 2013-02-04 DIAGNOSIS — E119 Type 2 diabetes mellitus without complications: Secondary | ICD-10-CM

## 2013-02-04 LAB — COMPREHENSIVE METABOLIC PANEL
ALT: <8 U/L (ref 0–35)
BUN: 13 mg/dL (ref 6–23)
CO2: 31 mEq/L (ref 19–32)
Calcium: 9.6 mg/dL (ref 8.4–10.5)
Chloride: 102 mEq/L (ref 96–112)
Creat: 0.71 mg/dL (ref 0.50–1.10)
Glucose, Bld: 102 mg/dL — ABNORMAL HIGH (ref 70–99)
Total Bilirubin: 0.2 mg/dL — ABNORMAL LOW (ref 0.3–1.2)

## 2013-02-04 LAB — HEMOGLOBIN A1C: Mean Plasma Glucose: 143 mg/dL — ABNORMAL HIGH (ref <117)

## 2013-02-04 LAB — CBC WITH DIFFERENTIAL/PLATELET
Basophils Absolute: 0 10*3/uL (ref 0.0–0.1)
HCT: 39 % (ref 36.0–46.0)
Lymphocytes Relative: 24 % (ref 12–46)
Lymphs Abs: 2.1 10*3/uL (ref 0.7–4.0)
MCV: 64.7 fL — ABNORMAL LOW (ref 78.0–100.0)
Monocytes Absolute: 0.5 10*3/uL (ref 0.1–1.0)
Neutro Abs: 6.2 10*3/uL (ref 1.7–7.7)
RBC: 6.03 MIL/uL — ABNORMAL HIGH (ref 3.87–5.11)
RDW: 18.2 % — ABNORMAL HIGH (ref 11.5–15.5)
WBC: 9 10*3/uL (ref 4.0–10.5)

## 2013-02-04 LAB — LIPID PANEL
Cholesterol: 234 mg/dL — ABNORMAL HIGH (ref 0–200)
HDL: 38 mg/dL — ABNORMAL LOW (ref >39)

## 2013-02-04 LAB — TSH: TSH: 3.28 u[IU]/mL (ref 0.350–4.500)

## 2013-02-04 MED ORDER — ATENOLOL 25 MG PO TABS: 25.0000 mg | ORAL_TABLET | Freq: Every day | ORAL | Status: DC

## 2013-02-04 MED ORDER — PANTOPRAZOLE SODIUM 40 MG PO TBEC: 40.0000 mg | DELAYED_RELEASE_TABLET | Freq: Every day | ORAL | Status: DC

## 2013-02-04 MED ORDER — SIMVASTATIN 40 MG PO TABS: 40.0000 mg | ORAL_TABLET | Freq: Every evening | ORAL | Status: DC

## 2013-02-04 MED ORDER — LORAZEPAM 0.5 MG PO TABS: 0.5000 mg | ORAL_TABLET | Freq: Two times a day (BID) | ORAL | Status: DC | PRN

## 2013-02-04 MED ORDER — VENLAFAXINE HCL ER 37.5 MG PO CP24: 37.5000 mg | ORAL_CAPSULE | Freq: Every day | ORAL | Status: DC

## 2013-02-04 MED ORDER — SIMVASTATIN 20 MG PO TABS: 20.0000 mg | ORAL_TABLET | Freq: Every evening | ORAL | Status: DC

## 2013-02-04 MED ORDER — LISINOPRIL 10 MG PO TABS: 10.0000 mg | ORAL_TABLET | Freq: Every day | ORAL | Status: DC

## 2013-02-04 MED ORDER — METFORMIN HCL ER 500 MG PO TB24: 500.0000 mg | ORAL_TABLET | Freq: Every day | ORAL | Status: DC

## 2013-02-04 NOTE — Patient Instructions (Addendum)
Start the effexor once a day Use the ativan twice a day as needed for anxiety Restart lisinopril with the atenolol We will call with lab results  F/U 4 weeks

## 2013-02-04 NOTE — Assessment & Plan Note (Signed)
EKG unchanged, not typical of ACS, I think this stress/anxiety

## 2013-02-04 NOTE — Assessment & Plan Note (Signed)
Increase zocor 40mg 

## 2013-02-04 NOTE — Progress Notes (Signed)
  Subjective:    Patient ID: Monique Gomez, female    DOB: [birth year], 56 y.o.   MRN: [id number]  HPI  Patient here with  depressed mood. She lost her mother a couple months ago and has been grieving since. She does not have a support a lot of support from her family. She feels like she cannot talk to them about the loss of her mother and how she feels. 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 She does not want to get out of bed she sat in cries a lot. She's also not sleeping. She denies any suicidal ideations but came in today for help. She has recently  established herself with a counselor with her job and will have an appointment within the next week. Yesterday she had some chest pain on and off and she want to have EKG done. She thinks this is due to anxiety as she became very overwhelmed yesterday thinking about her mother and began to cry and shake but was afraid she was having a heart attack. She's been taking some of her blood pressure medication but is out of her lisinopril.  Diabetes Mellitus she is taking her metformin as prescribed is overdue for lab work.   Review of Systems  GEN- + fatigue, fever, weight loss,weakness, recent illness HEENT- denies eye drainage, change in vision, nasal discharge, CVS- +chest pain, palpitations RESP- denies SOB, cough, wheeze ABD- denies N/V, change in stools, abd pain GU- denies dysuria, hematuria, dribbling, incontinence MSK- denies joint pain, muscle aches, injury Neuro- denies headache, dizziness, syncope, seizure activity      Objective:   Physical Exam  GEN- NAD, alert and oriented x3 HEENT- PERRL, EOMI, non injected sclera, pink conjunctiva, MMM, oropharynx clear Neck- Supple, no thyromegaly CVS- RRR, no murmur RESP-CTAB EXT- No edema Pulses- Radial, DP- 2+ Psych- depressed affect, crying, not anxious appearing, No SI, good eye contact   EKG-  NSR, inverted t waves anterolateral leads, compared to EKG Jan 2014 no change     Assessment & Plan:

## 2013-02-04 NOTE — Assessment & Plan Note (Signed)
Prolonged grief, start effexor daily, therapy established ,contracted for saftey

## 2013-02-04 NOTE — Assessment & Plan Note (Signed)
Check A1C, continue Metformin

## 2013-02-04 NOTE — Assessment & Plan Note (Addendum)
Very overwhelmed with little support Start meds Ativan at bedtime for sleep- will use short term until SNRI established

## 2013-02-04 NOTE — Assessment & Plan Note (Signed)
Uncontrolled, restart ACEI

## 2013-02-05 ENCOUNTER — Telehealth: Payer: Self-pay | Admitting: Family Medicine

## 2013-02-05 DIAGNOSIS — E785 Hyperlipidemia, unspecified: Secondary | ICD-10-CM

## 2013-02-05 MED ORDER — SIMVASTATIN 40 MG PO TABS: 40.0000 mg | ORAL_TABLET | Freq: Every evening | ORAL | Status: DC

## 2013-02-05 NOTE — Telephone Encounter (Signed)
Rec'd tow RX Simvastatin  20mg   And 40 mg  Which is correct.  Per last OV  Was increased to 40mg .

## 2013-03-04 ENCOUNTER — Ambulatory Visit: Payer: BC Managed Care – PPO | Admitting: Family Medicine

## 2013-05-17 ENCOUNTER — Encounter: Payer: BC Managed Care – PPO | Admitting: Cardiology

## 2013-05-17 ENCOUNTER — Encounter: Payer: Self-pay | Admitting: Cardiology

## 2013-05-17 NOTE — Progress Notes (Signed)
Canceled   This encounter was created in error - please disregard.

## 2013-05-24 ENCOUNTER — Other Ambulatory Visit: Payer: BC Managed Care – PPO | Admitting: Obstetrics & Gynecology

## 2013-06-21 ENCOUNTER — Other Ambulatory Visit: Payer: Self-pay | Admitting: Family Medicine

## 2013-06-21 ENCOUNTER — Encounter: Payer: Self-pay | Admitting: Family Medicine

## 2013-06-21 DIAGNOSIS — K219 Gastro-esophageal reflux disease without esophagitis: Secondary | ICD-10-CM

## 2013-06-21 DIAGNOSIS — I10 Essential (primary) hypertension: Secondary | ICD-10-CM

## 2013-06-21 NOTE — Telephone Encounter (Signed)
Refill appropriate and filled per protocol. 

## 2013-06-21 NOTE — Telephone Encounter (Signed)
Medication refill for one time only.  Patient needs to be seen.  Letter sent for patient to call and schedule 

## 2013-08-28 ENCOUNTER — Emergency Department (HOSPITAL_COMMUNITY): Payer: BC Managed Care – PPO

## 2013-08-28 ENCOUNTER — Emergency Department (HOSPITAL_COMMUNITY)
Admission: EM | Admit: 2013-08-28 | Discharge: 2013-08-28 | Disposition: A | Discharge status: Home / Self Care | Payer: BC Managed Care – PPO | Attending: Emergency Medicine | Admitting: Emergency Medicine

## 2013-08-28 ENCOUNTER — Encounter (HOSPITAL_COMMUNITY): Payer: Self-pay | Admitting: Emergency Medicine

## 2013-08-28 DIAGNOSIS — Y9389 Activity, other specified: Secondary | ICD-10-CM | POA: Insufficient documentation

## 2013-08-28 DIAGNOSIS — E669 Obesity, unspecified: Secondary | ICD-10-CM | POA: Insufficient documentation

## 2013-08-28 DIAGNOSIS — W1809XA Striking against other object with subsequent fall, initial encounter: Secondary | ICD-10-CM | POA: Insufficient documentation

## 2013-08-28 DIAGNOSIS — W19XXXA Unspecified fall, initial encounter: Secondary | ICD-10-CM

## 2013-08-28 DIAGNOSIS — E785 Hyperlipidemia, unspecified: Secondary | ICD-10-CM | POA: Insufficient documentation

## 2013-08-28 DIAGNOSIS — I10 Essential (primary) hypertension: Secondary | ICD-10-CM | POA: Insufficient documentation

## 2013-08-28 DIAGNOSIS — J309 Allergic rhinitis, unspecified: Secondary | ICD-10-CM | POA: Insufficient documentation

## 2013-08-28 DIAGNOSIS — Z862 Personal history of diseases of the blood and blood-forming organs and certain disorders involving the immune mechanism: Secondary | ICD-10-CM | POA: Insufficient documentation

## 2013-08-28 DIAGNOSIS — S39012A Strain of muscle, fascia and tendon of lower back, initial encounter: Secondary | ICD-10-CM

## 2013-08-28 DIAGNOSIS — Y929 Unspecified place or not applicable: Secondary | ICD-10-CM | POA: Insufficient documentation

## 2013-08-28 DIAGNOSIS — S335XXA Sprain of ligaments of lumbar spine, initial encounter: Secondary | ICD-10-CM | POA: Insufficient documentation

## 2013-08-28 DIAGNOSIS — Z87891 Personal history of nicotine dependence: Secondary | ICD-10-CM | POA: Insufficient documentation

## 2013-08-28 DIAGNOSIS — Z79899 Other long term (current) drug therapy: Secondary | ICD-10-CM | POA: Insufficient documentation

## 2013-08-28 MED ORDER — NAPROXEN 250 MG PO TABS
500.0000 mg | ORAL_TABLET | Freq: Once | ORAL | Status: AC
  Administered 2013-08-28: 500 mg via ORAL
  Filled 2013-08-28: qty 2

## 2013-08-28 MED ORDER — HYDROCODONE-ACETAMINOPHEN 5-325 MG PO TABS: 1.0000 | ORAL_TABLET | Freq: Four times a day (QID) | ORAL | Status: DC | PRN

## 2013-08-28 MED ORDER — CYCLOBENZAPRINE HCL 10 MG PO TABS: 10.0000 mg | ORAL_TABLET | Freq: Two times a day (BID) | ORAL | Status: DC | PRN

## 2013-08-28 MED ORDER — NAPROXEN 500 MG PO TABS: 500.0000 mg | ORAL_TABLET | Freq: Two times a day (BID) | ORAL | Status: DC

## 2013-08-28 NOTE — Discharge Instructions (Signed)
Back Pain, Adult Back pain is very common. The pain often gets better over time. The cause of back pain is usually not dangerous. Most people can learn to manage their back pain on their own.  HOME CARE   Stay active. Start with short walks on flat ground if you can. Try to walk farther each day.  Do not sit, drive, or stand in one place for more than 30 minutes. Do not stay in bed.  Do not avoid exercise or work. Activity can help your back heal faster.  Be careful when you bend or lift an object. Bend at your knees, keep the object close to you, and do not twist.  Sleep on a firm mattress. Lie on your side, and bend your knees. If you lie on your back, put a pillow under your knees.  Only take medicines as told by your doctor.  Put ice on the injured area.  Put ice in a plastic bag.  Place a towel between your skin and the bag.  Leave the ice on for 15-20 minutes, 03-04 times a day for the first 2 to 3 days. After that, you can switch between ice and heat packs.  Ask your doctor about back exercises or massage.  Avoid feeling anxious or stressed. Find good ways to deal with stress, such as exercise. GET HELP RIGHT AWAY IF:   Your pain does not go away with rest or medicine.  Your pain does not go away in 1 week.  You have new problems.  You do not feel well.  The pain spreads into your legs.  You cannot control when you poop (bowel movement) or pee (urinate).  Your arms or legs feel weak or lose feeling (numbness).  You feel sick to your stomach (nauseous) or throw up (vomit).  You have belly (abdominal) pain.  You feel like you may pass out (faint). MAKE SURE YOU:   Understand these instructions.  Will watch your condition.  Will get help right away if you are not doing well or get worse. Document Released: 10/05/2007 Document Revised: 07/11/2011 Document Reviewed: 09/06/2010 [facility] Patient Information 2014 [place].  Take medications as directed.  X-rays of the hips low back and tailbone area all negative. Clearly have musculoskeletal strain. Take medicines as directed. Return for any new or worse symptoms. If not improved in a few days followup with your Dr.

## 2013-08-28 NOTE — ED Notes (Signed)
Fall x 2 days ago, landing on buttock.  C/o lower back pain since.

## 2013-08-28 NOTE — ED Provider Notes (Signed)
CSN: [id number]     Arrival date & time 08/28/13  4854 History  This chart was scribed for Mervin Kung, MD by Ludger Nutting, ED Scribe. This patient was seen in room APA07/APA07 and the patient's care was started 9:22 AM.     Chief Complaint  Patient presents with  . Back Pain      The history is provided by the patient. No language interpreter was used.    HPI Comments: Monique Gomez is a 58 y.o. female who presents to the Emergency Department complaining of constant lower back and right hip pain that began after a fall that occurred 2 days ago. Patient states she was carrying a suitcase when she fell, striking her back on a metal bar and buttocks on concrete. She denies head injury or LOC. She states walking makes her pain worse. She has taken tylenol without relief. She denies chest pain, abdominal pain, SOB, lower extremity weakness or numbness.   Past Medical History  Diagnosis Date  . Allergic rhinitis   . Obesity   . Hyperlipidemia   . Essential hypertension, benign   . History of migraine headaches   . Type 2 diabetes mellitus   . Anemia    Past Surgical History  Procedure Laterality Date  . Right carpal tunnel release     Family History  Problem Relation Age of Onset  . Diabetes Mother   . Hypertension Mother   . Heart disease Mother   . Hypertension Brother    History  Substance Use Topics  . Smoking status: Former Smoker -- 0.30 packs/day    Types: Cigarettes  . Smokeless tobacco: Never Used  . Alcohol Use: No   OB History   Grav Para Term Preterm Abortions TAB SAB Ect Mult Living                 Review of Systems  Constitutional: Negative for fever and chills.  HENT: Negative for rhinorrhea and sore throat.   Eyes: Negative for visual disturbance.  Respiratory: Negative for cough and shortness of breath.   Cardiovascular: Negative for chest pain and leg swelling.  Gastrointestinal: Negative for nausea, vomiting, abdominal pain and diarrhea.   Genitourinary: Negative for dysuria.  Musculoskeletal: Positive for arthralgias (right hip pain), back pain and myalgias. Negative for neck pain.  Skin: Negative for rash and wound.  Allergic/Immunologic: Positive for environmental allergies.  Neurological: Negative for weakness, numbness and headaches.  Hematological: Does not bruise/bleed easily.  Psychiatric/Behavioral: Negative for confusion.      Allergies  Review of patient's allergies indicates no known allergies.  Home Medications   Prior to Admission medications   Medication Sig Start Date End Date Taking? Authorizing Provider  atenolol (TENORMIN) 25 MG tablet TAKE ONE TABLET BY MOUTH ONCE DAILY 06/21/13   Alycia Rossetti, MD  lisinopril (PRINIVIL,ZESTRIL) 10 MG tablet TAKE ONE TABLET BY MOUTH ONCE DAILY 06/21/13   Alycia Rossetti, MD  LORazepam (ATIVAN) 0.5 MG tablet Take 1 tablet (0.5 mg total) by mouth 2 (two) times daily as needed for anxiety. 02/04/13   Alycia Rossetti, MD  metFORMIN (GLUCOPHAGE XR) 500 MG 24 hr tablet Take 1 tablet (500 mg total) by mouth daily with breakfast. 02/04/13 02/04/14  Alycia Rossetti, MD  pantoprazole (PROTONIX) 40 MG tablet TAKE ONE TABLET BY MOUTH ONCE DAILY 06/21/13   Alycia Rossetti, MD  simvastatin (ZOCOR) 40 MG tablet Take 1 tablet (40 mg total) by mouth every evening. 02/05/13 02/05/14  Alycia Rossetti, MD  venlafaxine XR (EFFEXOR XR) 37.5 MG 24 hr capsule Take 1 capsule (37.5 mg total) by mouth daily. 02/04/13   Alycia Rossetti, MD   BP 151/83  Pulse 83  Temp(Src) 98.4 F (36.9 C) (Oral)  Resp 16  Ht 5\' 3"  (1.6 m)  Wt 306 lb (138.801 kg)  BMI 54.22 kg/m2  SpO2 98% Physical Exam  Nursing note and vitals reviewed. Constitutional: She is oriented to person, place, and time. She appears well-developed and well-nourished.  HENT:  Head: Normocephalic and atraumatic.  Eyes: EOM are normal.  Cardiovascular: Normal rate, regular rhythm and normal heart sounds.   No murmur  heard. Pulmonary/Chest: Effort normal and breath sounds normal. No respiratory distress.  Abdominal: Soft. Bowel sounds are normal. She exhibits no distension. There is no tenderness.  Musculoskeletal: She exhibits tenderness.  Tenderness to palpation to low lumbar area.   Neurological: She is alert and oriented to person, place, and time. No cranial nerve deficit.  Skin: Skin is warm and dry.  Psychiatric: She has a normal mood and affect.    ED Course  Procedures (including critical care time)  DIAGNOSTIC STUDIES: Oxygen Saturation is 98% on RA, normal by my interpretation.    COORDINATION OF CARE: 9:27 AM Discussed treatment plan with pt at bedside and pt agreed to plan.   Labs Review Labs Reviewed - No data to display  Imaging Review Dg Lumbar Spine Complete  08/28/2013   CLINICAL DATA:  Fall, back pain  EXAM: LUMBAR SPINE - COMPLETE 4+ VIEW  COMPARISON:  Chest x-ray 02/11/2010  FINDINGS: There are 5 nonrib bearing lumbar-type vertebral bodies. The vertebral body heights are maintained. The alignment is anatomic. There is no spondylolysis. There is no acute fracture or static listhesis. The disc spaces are maintained.  The SI joints are unremarkable.  IMPRESSION: No acute osseous injury of the lumbar spine.   Electronically Signed   By: Kathreen Devoid   On: 08/28/2013 10:30   Dg Sacrum/coccyx  08/28/2013   CLINICAL DATA:  Fall, back pain  EXAM: SACRUM AND COCCYX - 2+ VIEW  COMPARISON:  None.  FINDINGS: There is no evidence of fracture or other focal bone lesions  IMPRESSION: Negative.   Electronically Signed   By: Kathreen Devoid   On: 08/28/2013 10:33   Dg Hip Bilateral W/pelvis  08/28/2013   CLINICAL DATA:  Fall, back pain  EXAM: BILATERAL HIP WITH PELVIS - 4+ VIEW  COMPARISON:  None.  FINDINGS: There is no evidence of hip fracture or dislocation. There is no evidence of arthropathy or other focal bone abnormality.  IMPRESSION: No acute osseous injury of bilateral hips.    Electronically Signed   By: Kathreen Devoid   On: 08/28/2013 10:29     EKG Interpretation None      MDM   Final diagnoses:  Fall  Lumbar strain    No acute boney injuries from the fall. Will treat as muscular skeletal strain contusions. No other significant injuries. No abdominal pain no Chest pain no head injury no neck pain.   I personally performed the services described in this documentation, which was scribed in my presence. The recorded information has been reviewed and is accurate.    Mervin Kung, MD 08/29/13 [phone]

## 2013-08-30 DIAGNOSIS — I480 Paroxysmal atrial fibrillation: Secondary | ICD-10-CM

## 2013-08-30 HISTORY — DX: Paroxysmal atrial fibrillation: I48.0

## 2013-09-02 ENCOUNTER — Encounter (HOSPITAL_COMMUNITY): Payer: Self-pay | Admitting: Emergency Medicine

## 2013-09-02 ENCOUNTER — Emergency Department (HOSPITAL_COMMUNITY): Payer: BC Managed Care – PPO

## 2013-09-02 ENCOUNTER — Inpatient Hospital Stay (HOSPITAL_COMMUNITY)
Admission: EM | Admit: 2013-09-02 | Discharge: 2013-09-04 | DRG: 281 | Disposition: A | Discharge status: Home / Self Care | Payer: BC Managed Care – PPO | Attending: Internal Medicine | Admitting: Internal Medicine

## 2013-09-02 ENCOUNTER — Ambulatory Visit: Payer: BC Managed Care – PPO | Admitting: Family Medicine

## 2013-09-02 DIAGNOSIS — E119 Type 2 diabetes mellitus without complications: Secondary | ICD-10-CM | POA: Diagnosis present

## 2013-09-02 DIAGNOSIS — Z79899 Other long term (current) drug therapy: Secondary | ICD-10-CM

## 2013-09-02 DIAGNOSIS — Z6841 Body Mass Index (BMI) 40.0 and over, adult: Secondary | ICD-10-CM

## 2013-09-02 DIAGNOSIS — E785 Hyperlipidemia, unspecified: Secondary | ICD-10-CM | POA: Diagnosis present

## 2013-09-02 DIAGNOSIS — Z7901 Long term (current) use of anticoagulants: Secondary | ICD-10-CM

## 2013-09-02 DIAGNOSIS — I214 Non-ST elevation (NSTEMI) myocardial infarction: Secondary | ICD-10-CM | POA: Diagnosis present

## 2013-09-02 DIAGNOSIS — D649 Anemia, unspecified: Secondary | ICD-10-CM | POA: Diagnosis present

## 2013-09-02 DIAGNOSIS — Z87891 Personal history of nicotine dependence: Secondary | ICD-10-CM

## 2013-09-02 DIAGNOSIS — Z8249 Family history of ischemic heart disease and other diseases of the circulatory system: Secondary | ICD-10-CM

## 2013-09-02 DIAGNOSIS — Z7982 Long term (current) use of aspirin: Secondary | ICD-10-CM

## 2013-09-02 DIAGNOSIS — I059 Rheumatic mitral valve disease, unspecified: Secondary | ICD-10-CM

## 2013-09-02 DIAGNOSIS — Z833 Family history of diabetes mellitus: Secondary | ICD-10-CM

## 2013-09-02 DIAGNOSIS — E1169 Type 2 diabetes mellitus with other specified complication: Secondary | ICD-10-CM | POA: Diagnosis present

## 2013-09-02 DIAGNOSIS — I10 Essential (primary) hypertension: Secondary | ICD-10-CM | POA: Diagnosis present

## 2013-09-02 DIAGNOSIS — I4891 Unspecified atrial fibrillation: Principal | ICD-10-CM | POA: Diagnosis present

## 2013-09-02 HISTORY — DX: Gastro-esophageal reflux disease without esophagitis: K21.9

## 2013-09-02 HISTORY — DX: Paroxysmal atrial fibrillation: I48.0

## 2013-09-02 HISTORY — DX: Headache: R51

## 2013-09-02 LAB — CBC WITH DIFFERENTIAL/PLATELET
BASOS PCT: 0 % (ref 0–1)
Basophils Absolute: 0 10*3/uL (ref 0.0–0.1)
Eosinophils Absolute: 0.1 10*3/uL (ref 0.0–0.7)
Eosinophils Relative: 1 % (ref 0–5)
HCT: 38.5 % (ref 36.0–46.0)
Hemoglobin: 12.2 g/dL (ref 12.0–15.0)
LYMPHS PCT: 16 % (ref 12–46)
Lymphs Abs: 1.5 10*3/uL (ref 0.7–4.0)
MCH: 20.7 pg — ABNORMAL LOW (ref 26.0–34.0)
MCHC: 31.7 g/dL (ref 30.0–36.0)
MCV: 65.4 fL — ABNORMAL LOW (ref 78.0–100.0)
Monocytes Absolute: 0.6 10*3/uL (ref 0.1–1.0)
Monocytes Relative: 6 % (ref 3–12)
NEUTROS ABS: 7.3 10*3/uL (ref 1.7–7.7)
Neutrophils Relative %: 77 % (ref 43–77)
Platelets: 321 10*3/uL (ref 150–400)
RBC: 5.89 MIL/uL — ABNORMAL HIGH (ref 3.87–5.11)
RDW: 17 % — AB (ref 11.5–15.5)
WBC: 9.5 10*3/uL (ref 4.0–10.5)

## 2013-09-02 LAB — COMPREHENSIVE METABOLIC PANEL
ALK PHOS: 71 U/L (ref 39–117)
ALT: 8 U/L (ref 0–35)
AST: 12 U/L (ref 0–37)
Albumin: 3.2 g/dL — ABNORMAL LOW (ref 3.5–5.2)
BUN: 16 mg/dL (ref 6–23)
CHLORIDE: 103 meq/L (ref 96–112)
CO2: 26 mEq/L (ref 19–32)
Calcium: 9.1 mg/dL (ref 8.4–10.5)
Creatinine, Ser: 0.61 mg/dL (ref 0.50–1.10)
GFR calc non Af Amer: >90 mL/min (ref >90)
GLUCOSE: 146 mg/dL — AB (ref 70–99)
POTASSIUM: 4.2 meq/L (ref 3.7–5.3)
Sodium: 141 mEq/L (ref 137–147)
Total Protein: 7.5 g/dL (ref 6.0–8.3)

## 2013-09-02 LAB — TROPONIN I
TROPONIN I: 0.39 ng/mL — AB (ref <0.30)
Troponin I: 0.5 ng/mL (ref <0.30)

## 2013-09-02 LAB — TSH: TSH: 2.32 u[IU]/mL (ref 0.350–4.500)

## 2013-09-02 LAB — PROTIME-INR
INR: 1.01 (ref 0.00–1.49)
Prothrombin Time: 13.1 seconds (ref 11.6–15.2)

## 2013-09-02 LAB — HEPARIN LEVEL (UNFRACTIONATED): HEPARIN UNFRACTIONATED: 0.27 [IU]/mL — AB (ref 0.30–0.70)

## 2013-09-02 LAB — MAGNESIUM: MAGNESIUM: 1.9 mg/dL (ref 1.5–2.5)

## 2013-09-02 LAB — MRSA PCR SCREENING: MRSA by PCR: NEGATIVE

## 2013-09-02 LAB — T4, FREE: Free T4: 1.02 ng/dL (ref 0.80–1.80)

## 2013-09-02 LAB — APTT: aPTT: 27 seconds (ref 24–37)

## 2013-09-02 MED ORDER — ASPIRIN EC 81 MG PO TBEC
81.0000 mg | DELAYED_RELEASE_TABLET | Freq: Every day | ORAL | Status: DC
  Administered 2013-09-03: 81 mg via ORAL
  Filled 2013-09-02: qty 1

## 2013-09-02 MED ORDER — ONDANSETRON HCL 4 MG PO TABS: 4.0000 mg | ORAL_TABLET | Freq: Four times a day (QID) | ORAL | Status: DC | PRN

## 2013-09-02 MED ORDER — ATORVASTATIN CALCIUM 20 MG PO TABS
20.0000 mg | ORAL_TABLET | Freq: Every day | ORAL | Status: DC
  Administered 2013-09-02 – 2013-09-03 (×2): 20 mg via ORAL
  Filled 2013-09-02 (×4): qty 1

## 2013-09-02 MED ORDER — ATENOLOL 25 MG PO TABS
25.0000 mg | ORAL_TABLET | Freq: Every day | ORAL | Status: DC
  Administered 2013-09-02 – 2013-09-04 (×3): 25 mg via ORAL
  Filled 2013-09-02 (×3): qty 1

## 2013-09-02 MED ORDER — MORPHINE SULFATE 2 MG/ML IJ SOLN: 1.0000 mg | INTRAMUSCULAR | Status: DC | PRN

## 2013-09-02 MED ORDER — SODIUM CHLORIDE 0.9 % IJ SOLN
3.0000 mL | Freq: Two times a day (BID) | INTRAMUSCULAR | Status: DC
  Administered 2013-09-03: 3 mL via INTRAVENOUS

## 2013-09-02 MED ORDER — HEPARIN (PORCINE) IN NACL 100-0.45 UNIT/ML-% IJ SOLN
1250.0000 [IU]/h | INTRAMUSCULAR | Status: DC
  Administered 2013-09-02: 1100 [IU]/h via INTRAVENOUS
  Administered 2013-09-02 – 2013-09-03 (×2): 1250 [IU]/h via INTRAVENOUS
  Filled 2013-09-02 (×3): qty 250

## 2013-09-02 MED ORDER — PANTOPRAZOLE SODIUM 40 MG PO TBEC
40.0000 mg | DELAYED_RELEASE_TABLET | Freq: Every day | ORAL | Status: DC
  Administered 2013-09-02 – 2013-09-04 (×3): 40 mg via ORAL
  Filled 2013-09-02 (×2): qty 1

## 2013-09-02 MED ORDER — HEPARIN BOLUS VIA INFUSION
4000.0000 [IU] | Freq: Once | INTRAVENOUS | Status: AC
  Administered 2013-09-02: 4000 [IU] via INTRAVENOUS
  Filled 2013-09-02: qty 4000

## 2013-09-02 MED ORDER — SODIUM CHLORIDE 0.9 % IV SOLN: 250.0000 mL | INTRAVENOUS | Status: DC | PRN

## 2013-09-02 MED ORDER — ONDANSETRON HCL 4 MG/2ML IJ SOLN: 4.0000 mg | Freq: Four times a day (QID) | INTRAMUSCULAR | Status: DC | PRN

## 2013-09-02 MED ORDER — SIMVASTATIN 20 MG PO TABS: 40.0000 mg | ORAL_TABLET | Freq: Every evening | ORAL | Status: DC

## 2013-09-02 MED ORDER — DILTIAZEM HCL 25 MG/5ML IV SOLN
15.0000 mg | Freq: Once | INTRAVENOUS | Status: AC
  Administered 2013-09-02: 15 mg via INTRAVENOUS
  Filled 2013-09-02: qty 5

## 2013-09-02 MED ORDER — FLECAINIDE ACETATE 100 MG PO TABS
300.0000 mg | ORAL_TABLET | Freq: Once | ORAL | Status: AC
  Administered 2013-09-02: 300 mg via ORAL
  Filled 2013-09-02: qty 3

## 2013-09-02 MED ORDER — ACETAMINOPHEN 650 MG RE SUPP: 650.0000 mg | Freq: Four times a day (QID) | RECTAL | Status: DC | PRN

## 2013-09-02 MED ORDER — SODIUM CHLORIDE 0.9 % IJ SOLN: 3.0000 mL | INTRAMUSCULAR | Status: DC | PRN

## 2013-09-02 MED ORDER — SODIUM CHLORIDE 0.9 % IJ SOLN
3.0000 mL | Freq: Two times a day (BID) | INTRAMUSCULAR | Status: DC
  Administered 2013-09-02 – 2013-09-03 (×2): 3 mL via INTRAVENOUS

## 2013-09-02 MED ORDER — ACETAMINOPHEN 325 MG PO TABS: 650.0000 mg | ORAL_TABLET | Freq: Four times a day (QID) | ORAL | Status: DC | PRN

## 2013-09-02 MED ORDER — DILTIAZEM HCL 100 MG IV SOLR
5.0000 mg/h | INTRAVENOUS | Status: DC
  Administered 2013-09-02: 5 mg/h via INTRAVENOUS
  Filled 2013-09-02: qty 100

## 2013-09-02 NOTE — Consult Note (Signed)
Primary cardiologist: Dr. Satira Sark Consulting cardiologist: Dr. Satira Sark  Clinical Summary [name] is a 58 y.o.female admitted to the hospital with newly documented atrial fibrillation associated with rapid ventricular response. She states that she experienced chest discomfort and palpitations that began suddenly this morning. She has no prior history of atrial arrhythmias. We have evaluated her in the past, last seen in 2012 at which time she had a negative exercise echocardiogram.  CHADSVASC score is 3 at this point. Initial cardiac markers argue against ACS. She has been placed on intravenous Cardizem and heart rate is coming down nicely. She feels much better symptomatically.  She has been on a recent dietary supplement to lose weight, I reviewed Dr. Everett Graff history and physical and his review of the medication, no obvious cardiac side effects.  No history of thyroid disease, TSH normal.   No Known Allergies  Medications Scheduled Medications: . [START ON 09/03/2013] aspirin EC  81 mg Oral Daily  . atenolol  25 mg Oral Daily  . atorvastatin  20 mg Oral q1800  . pantoprazole  40 mg Oral Daily  . sodium chloride  3 mL Intravenous Q12H  . sodium chloride  3 mL Intravenous Q12H    Infusions: . diltiazem (CARDIZEM) infusion 10 mg/hr (09/02/13 1331)  . heparin 1,100 Units/hr (09/02/13 1328)    PRN Medications: sodium chloride, acetaminophen, acetaminophen, morphine injection, ondansetron (ZOFRAN) IV, ondansetron, sodium chloride   Past Medical History  Diagnosis Date  . Allergic rhinitis   . Obesity   . Hyperlipidemia   . Essential hypertension, benign   . History of migraine headaches   . Type 2 diabetes mellitus   . Anemia     Past Surgical History  Procedure Laterality Date  . Right carpal tunnel release      Family History  Problem Relation Age of Onset  . Diabetes Mother   . Hypertension Mother   . Heart disease Mother   .  Hypertension Brother     Social History [name] reports that she has quit smoking. Her smoking use included Cigarettes. She smoked 0.30 packs per day. She has never used smokeless tobacco. [name] reports that she does not drink alcohol.  Review of Systems No exertional chest pain. No history of palpitations or syncope. She had a fall a few weeks ago resulting in lower back strain in significant pain, otherwise no major health changes.  Physical Examination Blood pressure 114/92, pulse 114, temperature 97.9 F (36.6 C), temperature source Oral, resp. rate 18, height 5\' 2"  (1.575 m), weight 306 lb 7 oz (139 kg), SpO2 100.00%. No intake or output data in the 24 hours ending 09/02/13 1447  Telemetry: Atrial fibrillation, rate controlled.  Morbidly obese woman, appears comfortable at rest. HEENT: Conjunctiva and lids normal, oropharynx clear. Neck: Supple, no obvious elevated JVP or carotid bruits, no thyromegaly. Lungs: Clear to auscultation, nonlabored breathing at rest. Cardiac: Irregularly irregular, no S3 or significant systolic murmur, no pericardial rub. Abdomen: Soft, nontender, bowel sounds present, no guarding or rebound. Extremities: No pitting edema, distal pulses 2+. Skin: Warm and dry. Musculoskeletal: No kyphosis. Neuropsychiatric: Alert and oriented x3, affect grossly appropriate.   Lab Results  Basic Metabolic Panel:  Recent Labs Lab 09/02/13 0943  NA 141  K 4.2  CL 103  CO2 26  GLUCOSE 146*  BUN 16  CREATININE 0.61  CALCIUM 9.1  MG 1.9    Liver Function Tests:  Recent Labs Lab 09/02/13 [phone]  AST 12  ALT 8  ALKPHOS 71  BILITOT <0.2*  PROT 7.5  ALBUMIN 3.2*    CBC:  Recent Labs Lab 09/02/13 0943  WBC 9.5  NEUTROABS 7.3  HGB 12.2  HCT 38.5  MCV 65.4*  PLT 321    Cardiac Enzymes:  Recent Labs Lab 09/02/13 0943  TROPONINI <0.30    ECG Atrial fibrillation with rapid ventricular response, nonspecific ST-T changes,  possible repolarization abnormalities.  Imaging PORTABLE CHEST - 1 VIEW  COMPARISON: DG CHEST 2 VIEW dated 06/04/2012  FINDINGS: Low volume chest. No gross airspace disease. Cardiopericardial silhouette appears within normal limits. The exam is under penetrated due to body habitus. Monitoring leads project over the chest.  IMPRESSION: Low volume chest without acute cardiopulmonary disease.   Impression  1. New-onset atrial fibrillation with rapid ventricular response, heart rate coming down with intravenous diltiazem. No obvious precipitant, TSH normal, no clear evidence of ACS by enzymes so far. She had a negative ischemic work up two and a half years ago, and prior documentation of normal LV systolic function. CHADSVASC score is 3.   2. Hypertension.  3. Type 2 diabetes mellitus.  4. Obesity.   Recommendations  Discussed with patient. She is currently on intravenous diltiazem and heparin, continued on oral beta blocker. Plan to give single dose of flecainide 300 mg to hopefully aide in conversion to sinus rhythm. Echocardiogram pending for reassessment of cardiac structure and function. We did discuss possibility of oral anticoagulants, and she will consider the matter. We will plan to review this further.  Satira Sark, M.D., F.A.C.C.

## 2013-09-02 NOTE — Progress Notes (Signed)
Pt converted to NSR at approx 1630. Confirmation EKG ordered per protocol, MD made aware.

## 2013-09-02 NOTE — Progress Notes (Signed)
  Echocardiogram 2D Echocardiogram has been performed.  Monique Gomez 09/02/2013, 4:19 PM

## 2013-09-02 NOTE — ED Notes (Signed)
Pt reports chest tightness and "fluttering" sensation that started this am around 630. Pt reports history of anxiety and recent fall. Pt reports back pain ever since fall. Pt alert and oriented. Pt denies any n/v/d. Moderate dyspnea noted at rest and with exertion. EMS gave pt en route 1 nitro with no relief. Pt took 324 mg of aspirin this am.

## 2013-09-02 NOTE — Plan of Care (Signed)
Problem: Phase I Progression Outcomes Goal: Hemodynamically stable BP improving since cardizem discontinued at 1930.  Monitoring closely, remains in NSR.

## 2013-09-02 NOTE — Progress Notes (Signed)
ANTICOAGULATION CONSULT NOTE - Initial Consult  Pharmacy Consult for Heparin Indication: atrial fibrillation  No Known Allergies  Patient Measurements: Height: 5\' 2"  (157.5 cm) Weight: 306 lb 7 oz (139 kg) IBW/kg (Calculated) : 50.1 Heparin Dosing Weight: 85kg  Vital Signs: Temp: 97.9 F (36.6 C) (05/04 1220) Temp src: Oral (05/04 1220) BP: 114/92 mmHg (05/04 1100) Pulse Rate: 114 (05/04 1220)  Labs:  Recent Labs  09/02/13 0943  HGB 12.2  HCT 38.5  PLT 321  APTT 27  LABPROT 13.1  INR 1.01  CREATININE 0.61  TROPONINI <0.30    Estimated Creatinine Clearance: 106.2 ml/min (by C-G formula based on Cr of 0.61).   Medical History: Past Medical History  Diagnosis Date  . Allergic rhinitis   . Obesity   . Hyperlipidemia   . Essential hypertension, benign   . History of migraine headaches   . Type 2 diabetes mellitus   . Anemia     Medications:  Scheduled:  . [START ON 09/03/2013] aspirin EC  81 mg Oral Daily  . atenolol  25 mg Oral Daily  . atorvastatin  20 mg Oral q1800  . pantoprazole  40 mg Oral Daily  . sodium chloride  3 mL Intravenous Q12H  . sodium chloride  3 mL Intravenous Q12H    Assessment: 58 yo F with new onset afib.   No bleeding noted.  CBC reviewed.   Goal of Therapy:  Heparin level 0.3-0.7 units/ml Monitor platelets by anticoagulation protocol: Yes   Plan:  Give 4000 units bolus x 1 Start heparin infusion at 1100 units/hr Check anti-Xa level in 6 hours and daily while on heparin Continue to monitor H&H and platelets  Lavonia Drafts Woodrow Dulski 09/02/2013,12:36 PM

## 2013-09-02 NOTE — Progress Notes (Signed)
ANTICOAGULATION CONSULT NOTE  Pharmacy Consult for Heparin Indication: atrial fibrillation  No Known Allergies  Patient Measurements: Height: 5\' 2"  (157.5 cm) Weight: 306 lb 7 oz (139 kg) IBW/kg (Calculated) : 50.1 Heparin Dosing Weight: 85kg  Vital Signs: Temp: 98 F (36.7 C) (05/04 1921) Temp src: Oral (05/04 1921) BP: 91/60 mmHg (05/04 1600) Pulse Rate: 114 (05/04 1220)  Labs:  Recent Labs  09/02/13 0943 09/02/13 1635 09/02/13 1904  HGB 12.2  --   --   HCT 38.5  --   --   PLT 321  --   --   APTT 27  --   --   LABPROT 13.1  --   --   INR 1.01  --   --   HEPARINUNFRC  --   --  0.27*  CREATININE 0.61  --   --   TROPONINI <0.30 0.39*  --     Estimated Creatinine Clearance: 106.2 ml/min (by C-G formula based on Cr of 0.61).   Medical History: Past Medical History  Diagnosis Date  . Allergic rhinitis   . Obesity   . Hyperlipidemia   . Essential hypertension, benign   . History of migraine headaches   . Type 2 diabetes mellitus   . Anemia     Medications:  Scheduled:  . [START ON 09/03/2013] aspirin EC  81 mg Oral Daily  . atenolol  25 mg Oral Daily  . atorvastatin  20 mg Oral q1800  . pantoprazole  40 mg Oral Daily  . sodium chloride  3 mL Intravenous Q12H  . sodium chloride  3 mL Intravenous Q12H    Assessment: 58 yo F with new onset afib.   No bleeding noted.  CBC reviewed. Heparin level slightly below goal.  Goal of Therapy:  Heparin level 0.3-0.7 units/ml Monitor platelets by anticoagulation protocol: Yes   Plan:  Increase Heparin to 1250 units/hr. F/U AM labs.  Monique Gomez 09/02/2013,8:46 PM

## 2013-09-02 NOTE — ED Provider Notes (Signed)
CSN: [id number]     Arrival date & time 09/02/13  0826 History   This chart was scribed for Janice Norrie, MD by Lovena Le Day, ED scribe. This patient was seen in room APA06/APA06 and the patient's care was started at 0826.  Chief Complaint  Patient presents with  . Chest Pain   The history is provided by the patient. No language interpreter was used.   HPI Comments: Monique Gomez is a 58 y.o. female who presents to the Emergency Department for left sided  chest tightness, fluttering and shakiness, onset 3 hours ago when she got up to use the bathroom at 6:30 am.  She reports SOB on exertion w/walking around, improves w/rest. EMS gave her x1 NTG w/no relief. She denies nausea, vomiting, diarrhea,  leg swelling, leg pain, cough, light headiness, dizziness. She denies previous similar episodes. She has been taking a weight loss supplement called Garcinia cambogia chromium for x2 weeks, she has lost 8 lbs. Eating somewhat less, drinking lots of water. She states was here last Monday, 1 week ago today, in the ER after she fell and was having low back pain and this AM was supposed to have appt for f/u w/her PCP she was d/c from ER with NSAID and pain medicine, neither which she took except for Tylenol. She is not taking flexeril or claritin. She regularly takes atenolol, metformin, protonix and zocort.   She denies hx of heart problems. FH  Her mother had stents for blockages. No known family members with heart rhythm problems  Her PCP is Dr. Buelah Manis    Past Medical History  Diagnosis Date  . Allergic rhinitis   . Obesity   . Hyperlipidemia   . Essential hypertension, benign   . History of migraine headaches   . Type 2 diabetes mellitus   . Anemia    Past Surgical History  Procedure Laterality Date  . Right carpal tunnel release     Family History  Problem Relation Age of Onset  . Diabetes Mother   . Hypertension Mother   . Heart disease Mother   . Hypertension Brother    History   Substance Use Topics  . Smoking status: Former Smoker -- 0.30 packs/day    Types: Cigarettes  . Smokeless tobacco: Never Used  . Alcohol Use: No   She quit smoking x1 year ago. She drinks wine oocasionally, last drank 1 glass last PM. Has one glass per week. She works for [employer] as a Education officer, museum  OB History   Grav Para Term Preterm Abortions TAB SAB Ect Mult Living                 Review of Systems  Constitutional: Negative for fever and chills.  Respiratory: Positive for chest tightness. Negative for cough and shortness of breath.   Cardiovascular: Positive for palpitations. Negative for chest pain.  Gastrointestinal: Negative for abdominal pain.  Musculoskeletal: Negative for back pain.  All other systems reviewed and are negative.   Allergies  Review of patient's allergies indicates no known allergies.  Home Medications   Prior to Admission medications   Medication Sig Start Date End Date Taking? Authorizing Provider  acetaminophen (TYLENOL) 500 MG tablet Take 1,500 mg by mouth every 6 (six) hours as needed for moderate pain.    Historical Provider, MD  atenolol (TENORMIN) 25 MG tablet TAKE ONE TABLET BY MOUTH ONCE DAILY 06/21/13   Alycia Rossetti, MD  cyclobenzaprine (FLEXERIL) 10 MG tablet Take 1  tablet (10 mg total) by mouth 2 (two) times daily as needed for muscle spasms. 08/28/13   Shelda Jakes, MD  Garcinia Cambogia-Chromium 500-200 MG-MCG TABS Take 2 tablets by mouth 2 (two) times daily.    Historical Provider, MD  HYDROcodone-acetaminophen (NORCO/VICODIN) 5-325 MG per tablet Take 1-2 tablets by mouth every 6 (six) hours as needed for moderate pain. 08/28/13   Shelda Jakes, MD  lisinopril (PRINIVIL,ZESTRIL) 10 MG tablet TAKE ONE TABLET BY MOUTH ONCE DAILY 06/21/13   Salley Scarlet, MD  loratadine (CLARITIN) 10 MG tablet Take 10 mg by mouth daily as needed for allergies.    Historical Provider, MD  metFORMIN (GLUCOPHAGE XR) 500 MG 24 hr tablet Take 1  tablet (500 mg total) by mouth daily with breakfast. 02/04/13 02/04/14  Salley Scarlet, MD  naproxen (NAPROSYN) 500 MG tablet Take 1 tablet (500 mg total) by mouth 2 (two) times daily. 08/28/13   Shelda Jakes, MD  pantoprazole (PROTONIX) 40 MG tablet TAKE ONE TABLET BY MOUTH ONCE DAILY 06/21/13   Salley Scarlet, MD  simvastatin (ZOCOR) 40 MG tablet Take 1 tablet (40 mg total) by mouth every evening. 02/05/13 02/05/14  Salley Scarlet, MD   Triage Vitals: BP 123/81  Pulse 137  Temp(Src) 97.7 F (36.5 C) (Oral)  Resp 22  Ht 5\' 2"  (1.575 m)  Wt 304 lb (137.893 kg)  BMI 55.59 kg/m2  SpO2 98%  Vital signs normal except for tachycardia   Physical Exam  Nursing note and vitals reviewed. Constitutional: She is oriented to person, place, and time. She appears well-developed and well-nourished. No distress.  HENT:  Head: Normocephalic and atraumatic.  Right Ear: External ear normal.  Left Ear: External ear normal.  Nose: Nose normal.  Mouth/Throat: Oropharynx is clear and moist.  Eyes: Conjunctivae are normal. Pupils are equal, round, and reactive to light. Right eye exhibits no discharge. Left eye exhibits no discharge.  Neck: Normal range of motion. Neck supple.  Cardiovascular: An irregularly irregular rhythm present. Tachycardia present.  Exam reveals no gallop and no friction rub.   No murmur heard. Irregularly irregular tachycardia.   Pulmonary/Chest: Effort normal. No accessory muscle usage. Not tachypneic. No respiratory distress. She has no decreased breath sounds. She has no wheezes. She has no rhonchi. She has no rales. She exhibits no tenderness.  Abdominal: Soft. Bowel sounds are normal. She exhibits no distension and no mass. There is no tenderness. There is no rebound and no guarding.  Musculoskeletal: Normal range of motion. She exhibits no edema and no tenderness.  Neurological: She is alert and oriented to person, place, and time.  Skin: Skin is warm and dry.   Psychiatric: She has a normal mood and affect. Her behavior is normal. Thought content normal.    ED Course  Procedures (including critical care time)  Medications  diltiazem (CARDIZEM) 100 mg in dextrose 5 % 100 mL infusion (5 mg/hr Intravenous Rate/Dose Change 09/02/13 1500)  diltiazem (CARDIZEM) injection 15 mg (0 mg Intravenous Stopped 09/02/13 0930)    DIAGNOSTIC STUDIES: Oxygen Saturation is 98% on room air, normal by my interpretation.    COORDINATION OF CARE: At 900 AM Discussed treatment plan with patient which includes cardizem, CXR, blood work, cardiac enzymes, EKG. Patient agrees.   Patient started on Cardizem bolus and drip for her new-onset atrial fibrillation with fast ventricular response.  10:10 HR around 100 on cardizem drip, still in atrial fibrillation. Discussed with patient need for admission.  11:15 Dr Sarajane Jews admit to team 2, stepdown  Labs Review Results for orders placed during the hospital encounter of 09/02/13  COMPREHENSIVE METABOLIC PANEL      Result Value Ref Range   Sodium 141  137 - 147 mEq/L   Potassium 4.2  3.7 - 5.3 mEq/L   Chloride 103  96 - 112 mEq/L   CO2 26  19 - 32 mEq/L   Glucose, Bld 146 (*) 70 - 99 mg/dL   BUN 16  6 - 23 mg/dL   Creatinine, Ser 0.61  0.50 - 1.10 mg/dL   Calcium 9.1  8.4 - 10.5 mg/dL   Total Protein 7.5  6.0 - 8.3 g/dL   Albumin 3.2 (*) 3.5 - 5.2 g/dL   AST 12  0 - 37 U/L   ALT 8  0 - 35 U/L   Alkaline Phosphatase 71  39 - 117 U/L   Total Bilirubin <0.2 (*) 0.3 - 1.2 mg/dL   GFR calc non Af Amer >90  >90 mL/min   GFR calc Af Amer >90  >90 mL/min  CBC WITH DIFFERENTIAL      Result Value Ref Range   WBC 9.5  4.0 - 10.5 K/uL   RBC 5.89 (*) 3.87 - 5.11 MIL/uL   Hemoglobin 12.2  12.0 - 15.0 g/dL   HCT 38.5  36.0 - 46.0 %   MCV 65.4 (*) 78.0 - 100.0 fL   MCH 20.7 (*) 26.0 - 34.0 pg   MCHC 31.7  30.0 - 36.0 g/dL   RDW 17.0 (*) 11.5 - 15.5 %   Platelets 321  150 - 400 K/uL   Neutrophils Relative % 77  43 - 77 %    Neutro Abs 7.3  1.7 - 7.7 K/uL   Lymphocytes Relative 16  12 - 46 %   Lymphs Abs 1.5  0.7 - 4.0 K/uL   Monocytes Relative 6  3 - 12 %   Monocytes Absolute 0.6  0.1 - 1.0 K/uL   Eosinophils Relative 1  0 - 5 %   Eosinophils Absolute 0.1  0.0 - 0.7 K/uL   Basophils Relative 0  0 - 1 %   Basophils Absolute 0.0  0.0 - 0.1 K/uL  TROPONIN I      Result Value Ref Range   Troponin I <0.30  <0.30 ng/mL  T4, FREE      Result Value Ref Range   Free T4 1.02  0.80 - 1.80 ng/dL  TSH      Result Value Ref Range   TSH 2.320  0.350 - 4.500 uIU/mL  MAGNESIUM      Result Value Ref Range   Magnesium 1.9  1.5 - 2.5 mg/dL  APTT      Result Value Ref Range   aPTT 27  24 - 37 seconds  PROTIME-INR      Result Value Ref Range   Prothrombin Time 13.1  11.6 - 15.2 seconds   INR 1.01  0.00 - 1.49    Laboratory interpretation all normal except     Imaging Review Dg Chest Portable 1 View  09/02/2013   CLINICAL DATA:  Chest pain.  Hypertension.  EXAM: PORTABLE CHEST - 1 VIEW  COMPARISON:  DG CHEST 2 VIEW dated 06/04/2012  FINDINGS: Low volume chest. No gross airspace disease. Cardiopericardial silhouette appears within normal limits. The exam is under penetrated due to body habitus. Monitoring leads project over the chest.  IMPRESSION: Low volume chest without acute cardiopulmonary disease.  Electronically Signed   By: Dereck Ligas M.D.   On: 09/02/2013 09:01   Dg Lumbar Spine Complete  08/28/2013   CLINICAL DATA:  Fall, back pain  IMPRESSION: No acute osseous injury of the lumbar spine.   Electronically Signed   By: Kathreen Devoid   On: 08/28/2013 10:30   Dg Sacrum/coccyx  08/28/2013   CLINICAL DATA:  Fall, back pain    IMPRESSION: Negative.   Electronically Signed   By: Kathreen Devoid   On: 08/28/2013 10:33   Dg Hip Bilateral W/pelvis  08/28/2013   CLINICAL DATA:  Fall, back pain .  IMPRESSION: No acute osseous injury of bilateral hips.   Electronically Signed   By: Kathreen Devoid   On: 08/28/2013 10:29        EKG Interpretation None       Date: 09/02/2013  Rate: 149  Rhythm: atrial fibrillation  QRS Axis: normal  Intervals: normal  ST/T Wave abnormalities: nonspecific ST/T changes  Conduction Disutrbances:none  Narrative Interpretation:   Old EKG Reviewed: changes noted was in NSR    MDM   Final diagnoses:  Atrial fibrillation with rapid ventricular response  New onset atrial fibrillation    Plan admission   CRITICAL CARE Performed by: Redith Drach L Atavia Poppe Total critical care time: 36 min Critical care time was exclusive of separately billable procedures and treating other patients. Critical care was necessary to treat or prevent imminent or life-threatening deterioration. Critical care was time spent personally by me on the following activities: development of treatment plan with patient and/or surrogate as well as nursing, discussions with consultants, evaluation of patient's response to treatment, examination of patient, obtaining history from patient or surrogate, ordering and performing treatments and interventions, ordering and review of laboratory studies, ordering and review of radiographic studies, pulse oximetry and re-evaluation of patient's condition.   I personally performed the services described in this documentation, which was scribed in my presence. The recorded information has been reviewed and considered.  Rolland Porter, MD, Abram Sander      Janice Norrie, MD 09/02/13 1726

## 2013-09-02 NOTE — H&P (Signed)
History and Physical  Monique Gomez ZDG:[id number] DOB: 12-01-1955 DOA: 09/02/2013  Referring physician: Eliane Decree, MD in ED PCP: Vic Blackbird, MD  Cardiologist: Dr. Domenic Polite  Chief Complaint: Fluttering heart  HPI:  58 year old woman with no history of cardiac disease presented emergency department with sudden onset of fluttering heart rate, dyspnea on exertion and chest discomfort. Initial evaluation revealed atrial fibrillation with rapid ventricular response.  Patient has had no recent cardiac symptoms. No recent chest pain or shortness of breath. No history of arrhythmias. She fell approximately 2 weeks ago has had some low back pain since but no other new issues. She has been taking a weight loss supplement called Garcinia cambogia chromium**(see below) for x2 weeks, she has lost 8 lbs. no other new medications. She felt fine yesterday. This morning she got up and started her usual routine. She suddenly developed chest pressure, rapid heart rate in the fluttering sensation in her chest with associated dyspnea on exertion. The symptoms have improved in the emergency department. She took aspirin this morning. She has not taken her beta blocker this morning.   In the emergency department afebrile tachycardic up to 140s. Blood pressure stable. No hypoxia. Complete metabolic panel unremarkable, troponin negative. CBC stable. EKG shows atrial fibrillation rapid ventricular response with rate related ST changes. No ST elevation.  ** according to http://www.cox-reed.biz/  "With regard to toxicity and safety, it is important to note that except in rare cases, studies conducted in experimental animals have not reported increased mortality or significant toxicity. Furthermore, at the doses usually administered, no differences have been reported in terms of side effects or adverse events (those studied) in humans between individuals treated with G. cambogia and controls."  Review  of Systems:  Negative for fever, visual changes, sore throat, rash, dysuria, bleeding, n/v/abdominal pain.  Past Medical History  Diagnosis Date  . Allergic rhinitis   . Obesity   . Hyperlipidemia   . Essential hypertension, benign   . History of migraine headaches   . Type 2 diabetes mellitus   . Anemia     Past Surgical History  Procedure Laterality Date  . Right carpal tunnel release      Social History:  reports that she has quit smoking. Her smoking use included Cigarettes. She smoked 0.30 packs per day. She has never used smokeless tobacco. She reports that she does not drink alcohol or use illicit drugs.  No Known Allergies  Family History  Problem Relation Age of Onset  . Diabetes Mother   . Hypertension Mother   . Heart disease Mother   . Hypertension Brother      Prior to Admission medications   Medication Sig Start Date End Date Taking? Authorizing Provider  acetaminophen (TYLENOL) 500 MG tablet Take 1,000 mg by mouth every 6 (six) hours as needed for moderate pain.    Yes Historical Provider, MD  atenolol (TENORMIN) 25 MG tablet TAKE ONE TABLET BY MOUTH ONCE DAILY 06/21/13  Yes Alycia Rossetti, MD  Garcinia Cambogia-Chromium 500-200 MG-MCG TABS Take 2 tablets by mouth 2 (two) times daily.   Yes Historical Provider, MD  lisinopril (PRINIVIL,ZESTRIL) 10 MG tablet TAKE ONE TABLET BY MOUTH ONCE DAILY 06/21/13  Yes Alycia Rossetti, MD  metFORMIN (GLUCOPHAGE-XR) 500 MG 24 hr tablet Take 500 mg by mouth every evening.   Yes Historical Provider, MD  pantoprazole (PROTONIX) 40 MG tablet TAKE ONE TABLET BY MOUTH ONCE DAILY 06/21/13  Yes Alycia Rossetti, MD  simvastatin (ZOCOR) 40  MG tablet Take 1 tablet (40 mg total) by mouth every evening. 02/05/13 02/05/14 Yes Alycia Rossetti, MD   Physical Exam: Filed Vitals:   09/02/13 0930 09/02/13 0936 09/02/13 1045 09/02/13 1100  BP: 104/71 104/71 115/88 114/92  Pulse: 68 97 86 104  Temp:      TempSrc:      Resp: 15 16 13 18     Height:      Weight:      SpO2: 100% 98% 98% 98%    General: Evaluate in the emergency department. Appears calm and comfortable, nontoxic. Eyes: PERRL, normal lids, irises  ENT: grossly normal hearing, lips & tongue Neck: no LAD, masses or thyromegaly Cardiovascular: Irregular, tachycardic. No murmur, rub or gallop. No lower extremity edema. Telemetry: SR, no arrhythmias  Respiratory: CTA bilaterally, no w/r/r. Normal respiratory effort. Abdomen: soft, ntnd Skin: no rash or induration seen  Musculoskeletal: grossly normal tone BUE/BLE Psychiatric: grossly normal mood and affect, speech fluent and appropriate Neurologic: grossly non-focal.  Wt Readings from Last 3 Encounters:  09/02/13 137.893 kg (304 lb)  08/28/13 138.801 kg (306 lb)  02/04/13 134.265 kg (296 lb)    Labs on Admission:  Basic Metabolic Panel:  Recent Labs Lab 09/02/13 0943  NA 141  K 4.2  CL 103  CO2 26  GLUCOSE 146*  BUN 16  CREATININE 0.61  CALCIUM 9.1  MG 1.9    Liver Function Tests:  Recent Labs Lab 09/02/13 0943  AST 12  ALT 8  ALKPHOS 71  BILITOT <0.2*  PROT 7.5  ALBUMIN 3.2*    CBC:  Recent Labs Lab 09/02/13 0943  WBC 9.5  NEUTROABS 7.3  HGB 12.2  HCT 38.5  MCV 65.4*  PLT 321    Cardiac Enzymes:  Recent Labs Lab 09/02/13 0943  TROPONINI <0.30    Radiological Exams on Admission: Dg Chest Portable 1 View  09/02/2013   CLINICAL DATA:  Chest pain.  Hypertension.  EXAM: PORTABLE CHEST - 1 VIEW  COMPARISON:  DG CHEST 2 VIEW dated 06/04/2012  FINDINGS: Low volume chest. No gross airspace disease. Cardiopericardial silhouette appears within normal limits. The exam is under penetrated due to body habitus. Monitoring leads project over the chest.  IMPRESSION: Low volume chest without acute cardiopulmonary disease.   Electronically Signed   By: Dereck Ligas M.D.   On: 09/02/2013 09:01    EKG: Independently reviewed. As above.   Principal Problem:   Atrial fibrillation  with RVR Active Problems:   ANEMIA   Diabetes mellitus   Atrial fibrillation with rapid ventricular response   HTN (hypertension)   Hyperlipidemia   Assessment/Plan 1. New onset atrial fibrillation with rapid ventricular response. Etiology unclear. Hemodynamically stable on diltiazem drip. CHA2DS2VASC 3. 2. Diabetes mellitus type 2. 3. Hyperlipidemia. 4. HTN.   Admit to step down unit, continue diltiazem infusion. Patient missed her dose of beta blocker this morning, restart this medication. Already has had aspirin. Symptoms likely related to rapid rate. Doubt ACS.  2-D echocardiogram, check TSH  Start heparin, assess for long-term anticoagluation  Urine drug screen  Code Status: full code  DVT prophylaxis: heparin Family Communication: discussed with daughter present Disposition Plan/Anticipated LOS: admit 2 days  Time spent: 12 minutes  Murray Hodgkins, MD  [facility] Pager [phone] 09/02/2013, 11:39 AM

## 2013-09-03 ENCOUNTER — Ambulatory Visit (HOSPITAL_COMMUNITY)
Admission: RE | Admit: 2013-09-03 | Payer: BC Managed Care – PPO | Source: Ambulatory Visit | Admitting: Interventional Cardiology

## 2013-09-03 ENCOUNTER — Encounter (HOSPITAL_COMMUNITY): Payer: Self-pay | Admitting: General Practice

## 2013-09-03 ENCOUNTER — Encounter (HOSPITAL_COMMUNITY)
Admission: EM | Disposition: A | Discharge status: Home / Self Care | Payer: Self-pay | Source: Home / Self Care | Attending: Internal Medicine

## 2013-09-03 DIAGNOSIS — I10 Essential (primary) hypertension: Secondary | ICD-10-CM

## 2013-09-03 DIAGNOSIS — I214 Non-ST elevation (NSTEMI) myocardial infarction: Secondary | ICD-10-CM

## 2013-09-03 HISTORY — PX: LEFT HEART CATHETERIZATION WITH CORONARY ANGIOGRAM: SHX5451

## 2013-09-03 LAB — HEPARIN LEVEL (UNFRACTIONATED): HEPARIN UNFRACTIONATED: 0.39 [IU]/mL (ref 0.30–0.70)

## 2013-09-03 LAB — GLUCOSE, CAPILLARY: GLUCOSE-CAPILLARY: 84 mg/dL (ref 70–99)

## 2013-09-03 LAB — CBC
HCT: 35.6 % — ABNORMAL LOW (ref 36.0–46.0)
Hemoglobin: 11.1 g/dL — ABNORMAL LOW (ref 12.0–15.0)
MCH: 20.5 pg — ABNORMAL LOW (ref 26.0–34.0)
MCHC: 31.2 g/dL (ref 30.0–36.0)
MCV: 65.8 fL — AB (ref 78.0–100.0)
PLATELETS: 333 10*3/uL (ref 150–400)
RBC: 5.41 MIL/uL — AB (ref 3.87–5.11)
RDW: 17.3 % — ABNORMAL HIGH (ref 11.5–15.5)
WBC: 10.4 10*3/uL (ref 4.0–10.5)

## 2013-09-03 LAB — BASIC METABOLIC PANEL
BUN: 15 mg/dL (ref 6–23)
CALCIUM: 9.1 mg/dL (ref 8.4–10.5)
CO2: 28 meq/L (ref 19–32)
Chloride: 104 mEq/L (ref 96–112)
Creatinine, Ser: 0.71 mg/dL (ref 0.50–1.10)
GFR calc non Af Amer: >90 mL/min (ref >90)
Glucose, Bld: 122 mg/dL — ABNORMAL HIGH (ref 70–99)
Potassium: 4 mEq/L (ref 3.7–5.3)
Sodium: 142 mEq/L (ref 137–147)

## 2013-09-03 LAB — RAPID URINE DRUG SCREEN, HOSP PERFORMED
Amphetamines: NOT DETECTED
BARBITURATES: NOT DETECTED
BENZODIAZEPINES: NOT DETECTED
Cocaine: NOT DETECTED
Opiates: NOT DETECTED
TETRAHYDROCANNABINOL: NOT DETECTED

## 2013-09-03 LAB — TROPONIN I: TROPONIN I: 0.57 ng/mL — AB (ref <0.30)

## 2013-09-03 SURGERY — LEFT HEART CATHETERIZATION WITH CORONARY ANGIOGRAM
Anesthesia: LOCAL

## 2013-09-03 MED ORDER — SODIUM CHLORIDE 0.9 % IJ SOLN: 3.0000 mL | INTRAMUSCULAR | Status: DC | PRN

## 2013-09-03 MED ORDER — NITROGLYCERIN 0.2 MG/ML ON CALL CATH LAB
INTRAVENOUS | Status: AC
  Filled 2013-09-03: qty 1

## 2013-09-03 MED ORDER — HEPARIN SODIUM (PORCINE) 1000 UNIT/ML IJ SOLN
INTRAMUSCULAR | Status: AC
  Filled 2013-09-03: qty 1

## 2013-09-03 MED ORDER — SODIUM CHLORIDE 0.9 % IV SOLN: 250.0000 mL | INTRAVENOUS | Status: DC | PRN

## 2013-09-03 MED ORDER — HEPARIN (PORCINE) IN NACL 100-0.45 UNIT/ML-% IJ SOLN
1250.0000 [IU]/h | INTRAMUSCULAR | Status: DC
  Administered 2013-09-03: 1250 [IU]/h via INTRAVENOUS
  Filled 2013-09-03 (×2): qty 250

## 2013-09-03 MED ORDER — ASPIRIN EC 81 MG PO TBEC: 81.0000 mg | DELAYED_RELEASE_TABLET | Freq: Every day | ORAL | Status: DC

## 2013-09-03 MED ORDER — FENTANYL CITRATE 0.05 MG/ML IJ SOLN
INTRAMUSCULAR | Status: AC
  Filled 2013-09-03: qty 2

## 2013-09-03 MED ORDER — SODIUM CHLORIDE 0.9 % IV SOLN
1.0000 mL/kg/h | INTRAVENOUS | Status: AC
  Administered 2013-09-03: 1 mL/kg/h via INTRAVENOUS

## 2013-09-03 MED ORDER — MIDAZOLAM HCL 2 MG/2ML IJ SOLN
INTRAMUSCULAR | Status: AC
  Filled 2013-09-03: qty 2

## 2013-09-03 MED ORDER — VERAPAMIL HCL 2.5 MG/ML IV SOLN
INTRAVENOUS | Status: AC
  Filled 2013-09-03: qty 2

## 2013-09-03 MED ORDER — SODIUM CHLORIDE 0.9 % IJ SOLN: 3.0000 mL | Freq: Two times a day (BID) | INTRAMUSCULAR | Status: DC

## 2013-09-03 MED ORDER — HEPARIN (PORCINE) IN NACL 2-0.9 UNIT/ML-% IJ SOLN
INTRAMUSCULAR | Status: AC
  Filled 2013-09-03: qty 1000

## 2013-09-03 MED ORDER — ASPIRIN 81 MG PO CHEW
81.0000 mg | CHEWABLE_TABLET | ORAL | Status: AC
  Administered 2013-09-03: 81 mg via ORAL
  Filled 2013-09-03: qty 1

## 2013-09-03 MED ORDER — HEPARIN (PORCINE) IN NACL 2-0.9 UNIT/ML-% IJ SOLN
INTRAMUSCULAR | Status: AC
  Filled 2013-09-03: qty 500

## 2013-09-03 MED ORDER — LIDOCAINE HCL (PF) 1 % IJ SOLN
INTRAMUSCULAR | Status: AC
  Filled 2013-09-03: qty 30

## 2013-09-03 MED ORDER — ASPIRIN 81 MG PO CHEW: 81.0000 mg | CHEWABLE_TABLET | Freq: Every day | ORAL | Status: DC

## 2013-09-03 MED ORDER — ASPIRIN EC 81 MG PO TBEC
81.0000 mg | DELAYED_RELEASE_TABLET | Freq: Every day | ORAL | Status: DC
  Administered 2013-09-04: 81 mg via ORAL
  Filled 2013-09-03: qty 1

## 2013-09-03 MED ORDER — SODIUM CHLORIDE 0.9 % IV SOLN: 1.0000 mL/kg/h | INTRAVENOUS | Status: DC

## 2013-09-03 NOTE — CV Procedure (Signed)
       PROCEDURE:  Left heart catheterization with selective coronary angiography, left ventriculogram.  Ascending aortogram.  INDICATIONS:  NSTEMI  The risks, benefits, and details of the procedure were explained to the patient.  The patient verbalized understanding and wanted to proceed.  Informed written consent was obtained.  PROCEDURE TECHNIQUE:  After Xylocaine anesthesia a 24F slender sheath was placed in the right radial artery with a single anterior needle wall stick.   The JR 4 catheter and the versacore wire continued to select the Right common carotid. A hand injection of contrast was performed through the JR 4 catheter in the right subclavian artery. The catheter was then advanced and an additional injection was performed revealing an extremely tortuous proximal right subclavian. Since the wires continue to select the vertebral as well as carotid, we abandoned the radial approach. Femoral access in a sterile fashion was obtained after Xylocaine anesthesia. Right coronary angiography was done using a Judkins R4 guide catheter.  Left coronary angiography was done using a Judkins L4 guide catheter.  Left ventriculography was done using a pigtail catheter.  The pigtail catheter was pulled back into the aorta.A power injection of contrast was performed to image the ascending aorta. A TR band was used for hemostasis of the right radial. Manual compression will be used for hemostasis of the right femoral site.   CONTRAST:  Total of 100 cc.  COMPLICATIONS:  None.    HEMODYNAMICS:  Aortic pressure was 149/93; LV pressure was 146/6; LVEDP 15.  There was no gradient between the left ventricle and aorta.    ANGIOGRAPHIC DATA:   The left main coronary artery is widely patent.  The left anterior descending artery is a large vessel which reaches the apex. There is a couple of small diagonals which are patent. There is one large diagonal which is also widely patent.  The left circumflex artery is a  large vessel. There is a small ramus branch which is patent. The first obtuse marginal and second obtuse marginal are medium to large size vessels which are widely patent the remainder of the circumflex is patent the entire circumflex system is angiographically normal..  The right coronary artery is a large dominant vessel. There is a small area of ectasia in the proximal and. There is no significant atherosclerosis. Posterior lateral artery is patent. The posterior descending artery is patent.  LEFT VENTRICULOGRAM:  Left ventricular angiogram was done in the 30 RAO projection and revealed normal left ventricular wall motion and systolic function with an estimated ejection fraction of 60 %.  LVEDP was 16 mmHg.  ASCENDING AORTOGRAM: The descending aorta appears normal. The innominate artery is visualized. There appears to be a loop in the right subclavian. The carotid is visualized and is tortuous but patent.  There is normal flow in the right subclavian system.  IMPRESSIONS:  1. Normal left main coronary artery. 2. Normal left anterior descending artery and its branches. 3. Normal left circumflex artery and its branches. 4. Mild ectasia in the proximal right coronary artery without significant atherosclerosis. 5. Normal left ventricular systolic function.  LVEDP 16 mmHg.  Ejection fraction 60 %. 6.   Severe tortuosity of the right subclavian which prevented utilization of the right radial approach.  RECOMMENDATION:  Continue medical therapy for her atrial fibrillation. Of note, due to tortuosity in her right subclavian, would not use right radial approach if future catheterization is needed.

## 2013-09-03 NOTE — Progress Notes (Signed)
[place] for Heparin Indication: atrial fibrillation  No Known Allergies  Patient Measurements: Height: 5\' 2"  (157.5 cm) Weight: 313 lb 0.9 oz (142 kg) IBW/kg (Calculated) : 50.1 Heparin Dosing Weight: 85kg  Vital Signs: Temp: 98.3 F (36.8 C) (05/05 0400) Temp src: Oral (05/05 0400) BP: 113/80 mmHg (05/05 0630) Pulse Rate: 61 (05/05 0630)  Labs:  Recent Labs  09/02/13 0943 09/02/13 1635 09/02/13 1904 09/02/13 2153 09/03/13 0445  HGB 12.2  --   --   --  11.1*  HCT 38.5  --   --   --  35.6*  PLT 321  --   --   --  333  APTT 27  --   --   --   --   LABPROT 13.1  --   --   --   --   INR 1.01  --   --   --   --   HEPARINUNFRC  --   --  0.27*  --  0.39  CREATININE 0.61  --   --   --  0.71  TROPONINI <0.30 0.39*  --  0.50* 0.57*   Estimated Creatinine Clearance: 107.7 ml/min (by C-G formula based on Cr of 0.71).  Medical History: Past Medical History  Diagnosis Date  . Allergic rhinitis   . Obesity   . Hyperlipidemia   . Essential hypertension, benign   . History of migraine headaches   . Type 2 diabetes mellitus   . Anemia    Medications:  Scheduled:  . aspirin EC  81 mg Oral Daily  . atenolol  25 mg Oral Daily  . atorvastatin  20 mg Oral q1800  . pantoprazole  40 mg Oral Daily  . sodium chloride  3 mL Intravenous Q12H  . sodium chloride  3 mL Intravenous Q12H   Assessment: 58 yo F with new onset afib.   No bleeding noted.  CBC reviewed. Heparin level is now on target.  Goal of Therapy:  Heparin level 0.3-0.7 units/ml Monitor platelets by anticoagulation protocol: Yes   Plan:  Continue Heparin at 1250 units/hr. F/U plan  Arliene Rosenow A Jerni Selmer 09/03/2013,7:35 AM

## 2013-09-03 NOTE — Progress Notes (Signed)
Chart reviewed.  Discussed with Dr. Domenic Polite.   [facility] PROGRESS NOTE  WONDA GOODGAME ZOX:[id number] DOB: May 12, 1955 DOA: 09/02/2013 PCP: Vic Blackbird, MD  Assessment/Plan:  Principal Problem:   Atrial fibrillation with RVR, converted to NSR after flecanide.  cardizem gtt off. Remains on heparin gtt Active Problems: Positive troponins   ANEMIA   Diabetes mellitus controlled   HTN (hypertension)   Hyperlipidemia  Pt will transfer to [facility] for cath. Will keep NPO. Per Dr. Domenic Polite, will be on [facility] service.  HPI/Subjective: No chest pain or dyspnea. No palpitations  Objective: Filed Vitals:   09/03/13 0730  BP:   Pulse:   Temp: 98.1 F (36.7 C)  Resp:     Intake/Output Summary (Last 24 hours) at 09/03/13 0809 Last data filed at 09/03/13 0600  Gross per 24 hour  Intake 196.51 ml  Output      0 ml  Net 196.51 ml   Filed Weights   09/02/13 0830 09/02/13 1220 09/03/13 0500  Weight: 137.893 kg (304 lb) 139 kg (306 lb 7 oz) 142 kg (313 lb 0.9 oz)   Telemetry: Normal sinus rhythm  Exam:   General:  Comfortable. Walking around in her room.  Cardiovascular: Regular rate and rhythm without murmurs gallops rubs  Respiratory: Clear to auscultation bilaterally without wheezes rhonchi or rales  Abdomen: Obese soft nontender nondistended  Ext: No clubbing cyanosis or edema  Basic Metabolic Panel:  Recent Labs Lab 09/02/13 0943 09/03/13 0445  NA 141 142  K 4.2 4.0  CL 103 104  CO2 26 28  GLUCOSE 146* 122*  BUN 16 15  CREATININE 0.61 0.71  CALCIUM 9.1 9.1  MG 1.9  --    Liver Function Tests:  Recent Labs Lab 09/02/13 0943  AST 12  ALT 8  ALKPHOS 71  BILITOT <0.2*  PROT 7.5  ALBUMIN 3.2*   No results found for this basename: LIPASE, AMYLASE,  in the last 168 hours No results found for this basename: AMMONIA,  in the last 168 hours CBC:  Recent Labs Lab 09/02/13 0943 09/03/13 0445  WBC 9.5 10.4  NEUTROABS 7.3  --   HGB  12.2 11.1*  HCT 38.5 35.6*  MCV 65.4* 65.8*  PLT 321 333   Cardiac Enzymes:  Recent Labs Lab 09/02/13 0943 09/02/13 1635 09/02/13 2153 09/03/13 0445  TROPONINI <0.30 0.39* 0.50* 0.57*   BNP (last 3 results) No results found for this basename: PROBNP,  in the last 8760 hours CBG: No results found for this basename: GLUCAP,  in the last 168 hours  Recent Results (from the past 240 hour(s))  MRSA PCR SCREENING     Status: None   Collection Time    09/02/13 12:15 PM      Result Value Ref Range Status   MRSA by PCR NEGATIVE  NEGATIVE Final   Comment:            The GeneXpert MRSA Assay (FDA     approved for NASAL specimens     only), is one component of a     comprehensive MRSA colonization     surveillance program. It is not     intended to diagnose MRSA     infection nor to guide or     monitor treatment for     MRSA infections.     Studies: Dg Chest Portable 1 View  09/02/2013   CLINICAL DATA:  Chest pain.  Hypertension.  EXAM: PORTABLE CHEST - 1 VIEW  COMPARISON:  DG CHEST 2 VIEW dated 06/04/2012  FINDINGS: Low volume chest. No gross airspace disease. Cardiopericardial silhouette appears within normal limits. The exam is under penetrated due to body habitus. Monitoring leads project over the chest.  IMPRESSION: Low volume chest without acute cardiopulmonary disease.   Electronically Signed   By: Dereck Ligas M.D.   On: 09/02/2013 09:01    Scheduled Meds: . aspirin EC  81 mg Oral Daily  . atenolol  25 mg Oral Daily  . atorvastatin  20 mg Oral q1800  . pantoprazole  40 mg Oral Daily  . sodium chloride  3 mL Intravenous Q12H  . sodium chloride  3 mL Intravenous Q12H   Continuous Infusions: . heparin 1,250 Units/hr (09/03/13 0600)    Time spent: 35 minutes  Delfina Redwood, M.D.  [facility] Pager [phone]. If 7PM-7AM, please contact night-coverage at [email or web address], password [facility] 09/03/2013, 8:09 AM  LOS: 1 day

## 2013-09-03 NOTE — H&P (View-Only) (Signed)
Primary cardiologist: Dr. Satira Sark  Consulting cardiologist: Dr. Satira Sark  Subjective:   Feels better, no chest pain or palpitations.   Objective:   Temp:  [97.7 F (36.5 C)-99 F (37.2 C)] 98.1 F (36.7 C) (05/05 0730) Pulse Rate:  [61-137] 61 (05/05 0630) Resp:  [12-30] 27 (05/05 0630) BP: (84-128)/(47-94) 113/80 mmHg (05/05 0630) SpO2:  [90 %-100 %] 93 % (05/05 0630) Weight:  [304 lb (137.893 kg)-313 lb 0.9 oz (142 kg)] 313 lb 0.9 oz (142 kg) (05/05 0500) Last BM Date: 09/01/13  Filed Weights   09/02/13 0830 09/02/13 1220 09/03/13 0500  Weight: 304 lb (137.893 kg) 306 lb 7 oz (139 kg) 313 lb 0.9 oz (142 kg)    Intake/Output Summary (Last 24 hours) at 09/03/13 0813 Last data filed at 09/03/13 0600  Gross per 24 hour  Intake 196.51 ml  Output      0 ml  Net 196.51 ml    Telemetry: Sinus rhythm.  Exam:  General: Obese, no distress.  Lungs: Clear, nonlabored.  Cardiac: RRR, no gallop, 2/6 systolic murmur.  Extremities: Trace ankle edema.   Lab Results:  Basic Metabolic Panel:  Recent Labs Lab 09/02/13 0943 09/03/13 0445  NA 141 142  K 4.2 4.0  CL 103 104  CO2 26 28  GLUCOSE 146* 122*  BUN 16 15  CREATININE 0.61 0.71  CALCIUM 9.1 9.1  MG 1.9  --     Liver Function Tests:  Recent Labs Lab 09/02/13 0943  AST 12  ALT 8  ALKPHOS 71  BILITOT <0.2*  PROT 7.5  ALBUMIN 3.2*    CBC:  Recent Labs Lab 09/02/13 0943 09/03/13 0445  WBC 9.5 10.4  HGB 12.2 11.1*  HCT 38.5 35.6*  MCV 65.4* 65.8*  PLT 321 333    Cardiac Enzymes:  Recent Labs Lab 09/02/13 1635 09/02/13 2153 09/03/13 0445  TROPONINI 0.39* 0.50* 0.57*    Coagulation:  Recent Labs Lab 09/02/13 0943  INR 1.01    ECG: Sinus rhythm with diffuse, NSST changes.   Echocardiogram (09/02/2013): Study Conclusions  - Left ventricle: The cavity size was normal. Wall thickness was increased in a pattern of mild to moderateLVH. Systolic function  was normal. The estimated ejection fraction was in the range of 60% to 65%. Wall motion was normal; there were no regional wall motion abnormalities. The study is not technically sufficient to allow evaluation of LV diastolic function. - Aortic valve: Mildly calcified annulus. Trileaflet. No significant regurgitation. - Mitral valve: Calcified annulus. Mild regurgitation. - Left atrium: The atrium was at the upper limits of normal in size. - Right ventricle: The cavity size was mildly dilated. - Right atrium: Central venous pressure: 88mm Hg (est). - Atrial septum: No defect or patent foramen ovale was identified. - Tricuspid valve: Mild regurgitation. - Pulmonary arteries: PA peak pressure: 50mm Hg (S). - Pericardium, extracardiac: There was no pericardial effusion. Impressions:  - Mild to moderate LVH with LVEF 60-65%. Indeterminate diastolic function. Upper normal left atrial size. MAC with mild mitral regurgitation. Mild RV enlargement. Mild tricuspid regurgitation with PASP 33 mmHg.    Medications:   Scheduled Medications: . aspirin EC  81 mg Oral Daily  . atenolol  25 mg Oral Daily  . atorvastatin  20 mg Oral q1800  . pantoprazole  40 mg Oral Daily  . sodium chloride  3 mL Intravenous Q12H  . sodium chloride  3 mL Intravenous Q12H     Infusions: .  heparin 1,250 Units/hr (09/03/13 0600)     PRN Medications:  sodium chloride, acetaminophen, acetaminophen, morphine injection, ondansetron (ZOFRAN) IV, ondansetron, sodium chloride   Assessment:   1. New-onset atrial fibrillation with rapid ventricular response. TSH normal. CHADSVASC score is 3. She converted to sinus rhythm on Cardizem and with single dose Flecainide. Remains on Heparin GTT for now.  2. NSTEMI, type 1 versus type 2 with atrial fibrillation and RVR. Peak troponin I 0.57. LVEF normal by echocardiogram.  2. Hypertension.   3. Type 2 diabetes mellitus.   4. Obesity.   Plan/Discussion:     Discussed with patient. We reviewed options for further ischemic workup and plan is to proceed with transfer to [facility] for diagnostic cardiac catheterization to clearly outline coronary anatomy - important to assess prior to initiating anticoagulant for AF in case she also needs PCI/stent.   Satira Sark, M.D., F.A.C.C.

## 2013-09-03 NOTE — Progress Notes (Signed)
Notified on call Mid Level MD of 2nd elevated troponin, patient asymptomatic, no pain.

## 2013-09-03 NOTE — Progress Notes (Signed)
Report called and given to [name], Therapist, sports. Patient being transported to [facility], department 2W, Patient alert, oriented and in stable condition at the time of discharge . Patient being transported via [facility] and family is meeting the patient at [facility].

## 2013-09-03 NOTE — Interval H&P Note (Signed)
Cath Lab Visit (complete for each Cath Lab visit)  Clinical Evaluation Leading to the Procedure:   ACS: yes  Non-ACS:    Anginal Classification: CCS IV  Anti-ischemic medical therapy: Minimal Therapy (1 class of medications)  Non-Invasive Test Results: No non-invasive testing performed  Prior CABG: No previous CABG      History and Physical Interval Note:  09/03/2013 4:14 PM  Monique Gomez  has presented today for surgery, with the diagnosis of cp  The various methods of treatment have been discussed with the patient and family. After consideration of risks, benefits and other options for treatment, the patient has consented to  Procedure(s): LEFT HEART CATHETERIZATION WITH CORONARY ANGIOGRAM (N/A) as a surgical intervention .  The patient's history has been reviewed, patient examined, no change in status, stable for surgery.  I have reviewed the patient's chart and labs.  Questions were answered to the patient's satisfaction.     Jettie Booze

## 2013-09-03 NOTE — Progress Notes (Signed)
Primary cardiologist: Dr. Satira Sark  Consulting cardiologist: Dr. Satira Sark  Subjective:   Feels better, no chest pain or palpitations.   Objective:   Temp:  [97.7 F (36.5 C)-99 F (37.2 C)] 98.1 F (36.7 C) (05/05 0730) Pulse Rate:  [61-137] 61 (05/05 0630) Resp:  [12-30] 27 (05/05 0630) BP: (84-128)/(47-94) 113/80 mmHg (05/05 0630) SpO2:  [90 %-100 %] 93 % (05/05 0630) Weight:  [304 lb (137.893 kg)-313 lb 0.9 oz (142 kg)] 313 lb 0.9 oz (142 kg) (05/05 0500) Last BM Date: 09/01/13  Filed Weights   09/02/13 0830 09/02/13 1220 09/03/13 0500  Weight: 304 lb (137.893 kg) 306 lb 7 oz (139 kg) 313 lb 0.9 oz (142 kg)    Intake/Output Summary (Last 24 hours) at 09/03/13 0813 Last data filed at 09/03/13 0600  Gross per 24 hour  Intake 196.51 ml  Output      0 ml  Net 196.51 ml    Telemetry: Sinus rhythm.  Exam:  General: Obese, no distress.  Lungs: Clear, nonlabored.  Cardiac: RRR, no gallop, 2/6 systolic murmur.  Extremities: Trace ankle edema.   Lab Results:  Basic Metabolic Panel:  Recent Labs Lab 09/02/13 0943 09/03/13 0445  NA 141 142  K 4.2 4.0  CL 103 104  CO2 26 28  GLUCOSE 146* 122*  BUN 16 15  CREATININE 0.61 0.71  CALCIUM 9.1 9.1  MG 1.9  --     Liver Function Tests:  Recent Labs Lab 09/02/13 0943  AST 12  ALT 8  ALKPHOS 71  BILITOT <0.2*  PROT 7.5  ALBUMIN 3.2*    CBC:  Recent Labs Lab 09/02/13 0943 09/03/13 0445  WBC 9.5 10.4  HGB 12.2 11.1*  HCT 38.5 35.6*  MCV 65.4* 65.8*  PLT 321 333    Cardiac Enzymes:  Recent Labs Lab 09/02/13 1635 09/02/13 2153 09/03/13 0445  TROPONINI 0.39* 0.50* 0.57*    Coagulation:  Recent Labs Lab 09/02/13 0943  INR 1.01    ECG: Sinus rhythm with diffuse, NSST changes.   Echocardiogram (09/02/2013): Study Conclusions  - Left ventricle: The cavity size was normal. Wall thickness was increased in a pattern of mild to moderateLVH. Systolic function  was normal. The estimated ejection fraction was in the range of 60% to 65%. Wall motion was normal; there were no regional wall motion abnormalities. The study is not technically sufficient to allow evaluation of LV diastolic function. - Aortic valve: Mildly calcified annulus. Trileaflet. No significant regurgitation. - Mitral valve: Calcified annulus. Mild regurgitation. - Left atrium: The atrium was at the upper limits of normal in size. - Right ventricle: The cavity size was mildly dilated. - Right atrium: Central venous pressure: 88mm Hg (est). - Atrial septum: No defect or patent foramen ovale was identified. - Tricuspid valve: Mild regurgitation. - Pulmonary arteries: PA peak pressure: 50mm Hg (S). - Pericardium, extracardiac: There was no pericardial effusion. Impressions:  - Mild to moderate LVH with LVEF 60-65%. Indeterminate diastolic function. Upper normal left atrial size. MAC with mild mitral regurgitation. Mild RV enlargement. Mild tricuspid regurgitation with PASP 33 mmHg.    Medications:   Scheduled Medications: . aspirin EC  81 mg Oral Daily  . atenolol  25 mg Oral Daily  . atorvastatin  20 mg Oral q1800  . pantoprazole  40 mg Oral Daily  . sodium chloride  3 mL Intravenous Q12H  . sodium chloride  3 mL Intravenous Q12H     Infusions: .  heparin 1,250 Units/hr (09/03/13 0600)     PRN Medications:  sodium chloride, acetaminophen, acetaminophen, morphine injection, ondansetron (ZOFRAN) IV, ondansetron, sodium chloride   Assessment:   1. New-onset atrial fibrillation with rapid ventricular response. TSH normal. CHADSVASC score is 3. She converted to sinus rhythm on Cardizem and with single dose Flecainide. Remains on Heparin GTT for now.  2. NSTEMI, type 1 versus type 2 with atrial fibrillation and RVR. Peak troponin I 0.57. LVEF normal by echocardiogram.  2. Hypertension.   3. Type 2 diabetes mellitus.   4. Obesity.   Plan/Discussion:     Discussed with patient. We reviewed options for further ischemic workup and plan is to proceed with transfer to [facility] for diagnostic cardiac catheterization to clearly outline coronary anatomy - important to assess prior to initiating anticoagulant for AF in case she also needs PCI/stent.   Satira Sark, M.D., F.A.C.C.

## 2013-09-03 NOTE — Progress Notes (Signed)
Utilization Review Completed.Neoma Laming T Dowell5/08/2013

## 2013-09-04 ENCOUNTER — Encounter (HOSPITAL_COMMUNITY): Payer: Self-pay | Admitting: Physician Assistant

## 2013-09-04 DIAGNOSIS — I214 Non-ST elevation (NSTEMI) myocardial infarction: Secondary | ICD-10-CM

## 2013-09-04 LAB — HEPARIN LEVEL (UNFRACTIONATED): Heparin Unfractionated: 0.23 IU/mL — ABNORMAL LOW (ref 0.30–0.70)

## 2013-09-04 MED ORDER — METFORMIN HCL ER 500 MG PO TB24: 500.0000 mg | ORAL_TABLET | Freq: Every evening | ORAL | Status: DC

## 2013-09-04 MED ORDER — APIXABAN 5 MG PO TABS: 5.0000 mg | ORAL_TABLET | Freq: Two times a day (BID) | ORAL | Status: DC

## 2013-09-04 MED ORDER — APIXABAN 5 MG PO TABS
5.0000 mg | ORAL_TABLET | Freq: Two times a day (BID) | ORAL | Status: DC
Start: 2013-09-04 — End: 2014-03-07

## 2013-09-04 MED ORDER — APIXABAN 5 MG PO TABS
5.0000 mg | ORAL_TABLET | Freq: Two times a day (BID) | ORAL | Status: DC
  Administered 2013-09-04: 5 mg via ORAL
  Filled 2013-09-04 (×2): qty 1

## 2013-09-04 MED ORDER — ASPIRIN 81 MG PO TBEC: 81.0000 mg | DELAYED_RELEASE_TABLET | Freq: Every day | ORAL | Status: DC

## 2013-09-04 NOTE — Progress Notes (Addendum)
No local groin cath complications She is ready for discharge. Needs OP sleep study to r/o OSA as trigger for AF. Needs at least 3 weeks of oral anticoagulation (NOAC if she can afford). She will f/u with Dr. McDowell whom she identifies as her primary cardiologist. 

## 2013-09-04 NOTE — Discharge Summary (Signed)
Discharge Summary   Patient ID: Monique Gomez MRN: [id number], DOB/AGE: 1956/03/07 58 y.o. Admit date: 09/02/2013 D/C date:     09/04/2013  Primary Cardiologist: Dr. Domenic Polite  Principal Problem:   Atrial fibrillation with RVR Active Problems:   NSTEMI (non-ST elevated myocardial infarction)   ANEMIA   Diabetes mellitus   HTN (hypertension)   Hyperlipidemia   Morbid obesity  Body mass index is 57.24 kg/(m^2).   Admission Dates: 09/02/13-5/6/815 Discharge Diagnosis: New onset atrial fibrillation with RVR complicated by mild NSTEMI s/p LHC with no CAD.   HPI: Monique Gomez is a 58 y.o. female with a history of diabetes mellitus, HTN, hyperlipidemia and morbid obesity who presented to [facility] on 09/02/13 with newly documented afib with RVR. She was also found to have a NSTEMI and was transferred to [facility] for cardiac cath and further ischemic workup.   Hospital Course: New-onset atrial fibrillation with RVR: CHADSVASC score is 3 (HTN, DM, Female sex) -- TSH normal. Drug screen unremarkable.  -- She converted to sinus rhythm on Cardizem and with single dose Flecainide.  -- Heparin GTT converted to Eliquis 5mg  BID (seen by care management. Will get 30 days free and then 10$/mo co-pay). This will be continued for 3 weeks. Whether or not she will remain on anticoagulation long term can be discussed as an outpatient with her primary cardiologist, Dr. Domenic Polite.  -- ECHO on 09/02/13 with moderate LVH. EF 60-65%. Indeterminate diastolic function. Upper normal LA size. MAC with mild mitral regurgitation. Mild RV enlargement. Mild TR with PASP 33 mmHg.  -- She will need an outpatient sleep study to r/o OSA as trigger for AF.   NSTEMI- type 2 with atrial fibrillation and RVR. Peak troponin I. 0.57.  -- LHC on 09/03/13 Normal left main, LAD and branches, LCx, mild ectasia in the pRCA without significant atherosclerosis, normal left ventricular systolic function. LVEDP 16 mmHg. Ejection fraction 60 %. There  was severe tortuosity of the right subclavian which prevented utilization of the right radial approach.  -- Continue ASA, BB and statin   Hypertension- well controlled.   HLD- continue statin  Type 2 diabetes mellitus: resume metformin tomorrow (>48 hours after contrast dye exposure) as well as lisinopril.  Obesity- counseled on diet and exercise    The patient has had an uncomplicated hospital course and is recovering well. The radial catheter site is stable. She has been seen by Dr. Tamala Julian today and deemed ready for discharge home. All follow-up appointments have been scheduled. A 30 day free supply of Eliquis was provided for the patient by care management. Discharge medications include Eliquis 5mg  BID, atenolol 25mg  qd, simvastatin 40 qd, lisinopril 10mg  and ASA.  Outstanding Studies: she will need an outpatient sleep study to r/o suspected OSA  Miscellaneous Notes: Of note, due to tortuosity in her right subclavian, would not use right radial approach if future catheterization is needed.   Discharge Vitals: Blood pressure 137/72, pulse 90, temperature 98.3 F (36.8 C), temperature source Oral, resp. rate 20, height 5\' 2"  (1.575 m), weight 313 lb 0.9 oz (142 kg), SpO2 98.00%.  Labs: Lab Results  Component Value Date   WBC 10.4 09/03/2013   HGB 11.1* 09/03/2013   HCT 35.6* 09/03/2013   MCV 65.8* 09/03/2013   PLT 333 09/03/2013     Recent Labs Lab 09/02/13 0943 09/03/13 0445  NA 141 142  K 4.2 4.0  CL 103 104  CO2 26 28  BUN 16 15  CREATININE  0.61 0.71  CALCIUM 9.1 9.1  PROT 7.5  --   BILITOT <0.2*  --   ALKPHOS 71  --   ALT 8  --   AST 12  --   GLUCOSE 146* 122*    Recent Labs  09/02/13 0943 09/02/13 1635 09/02/13 2153 09/03/13 0445  TROPONINI <0.30 0.39* 0.50* 0.57*    Diagnostic Studies/Procedures   Dg Lumbar Spine Complete  08/28/2013   CLINICAL DATA:  Fall, back pain  EXAM: LUMBAR SPINE - COMPLETE 4+ VIEW  COMPARISON:  Chest x-ray 02/11/2010  FINDINGS: There  are 5 nonrib bearing lumbar-type vertebral bodies. The vertebral body heights are maintained. The alignment is anatomic. There is no spondylolysis. There is no acute fracture or static listhesis. The disc spaces are maintained.  The SI joints are unremarkable.  IMPRESSION: No acute osseous injury of the lumbar spine.    Dg Chest Portable 1 View  09/02/2013   CLINICAL DATA:  Chest pain.  Hypertension.  EXAM: PORTABLE CHEST - 1 VIEW  COMPARISON:  DG CHEST 2 VIEW dated 06/04/2012  FINDINGS: Low volume chest. No gross airspace disease. Cardiopericardial silhouette appears within normal limits. The exam is under penetrated due to body habitus. Monitoring leads project over the chest.  IMPRESSION: Low volume chest without acute cardiopulmonary disease.       Echocardiogram (09/02/2013):  Study Conclusions - Left ventricle: The cavity size was normal. Wall thickness was increased in a pattern of mild to moderateLVH. Systolic function was normal. The estimated ejection fraction was in the range of 60% to 65%. Wall motion was normal; there were no regional wall motion abnormalities. The study is not technically sufficient to allow evaluation of LV diastolic function. - Aortic valve: Mildly calcified annulus. Trileaflet. No significant regurgitation. - Mitral valve: Calcified annulus. Mild regurgitation. - Left atrium: The atrium was at the upper limits of normal in size. - Right ventricle: The cavity size was mildly dilated. - Right atrium: Central venous pressure: 86mm Hg (est). - Atrial septum: No defect or patent foramen ovale was identified. - Tricuspid valve: Mild regurgitation. - Pulmonary arteries: PA peak pressure: 51mm Hg (S). - Pericardium, extracardiac: There was no pericardial effusion. Impressions: - Mild to moderate LVH with LVEF 60-65%. Indeterminate diastolic function. Upper normal left atrial size. MAC with mild mitral regurgitation. Mild RV enlargement. Mild tricuspid  regurgitation with PASP 33 mmHg.  09/03/13  PROCEDURE: Left heart catheterization with selective coronary angiography, left ventriculogram. Ascending aortogram.  INDICATIONS: NSTEMI  The risks, benefits, and details of the procedure were explained to the patient. The patient verbalized understanding and wanted to proceed. Informed written consent was obtained.  PROCEDURE TECHNIQUE: After Xylocaine anesthesia a 69F slender sheath was placed in the right radial artery with a single anterior needle wall stick. The JR 4 catheter and the versacore wire continued to select the Right common carotid. A hand injection of contrast was performed through the JR 4 catheter in the right subclavian artery. The catheter was then advanced and an additional injection was performed revealing an extremely tortuous proximal right subclavian. Since the wires continue to select the vertebral as well as carotid, we abandoned the radial approach. Femoral access in a sterile fashion was obtained after Xylocaine anesthesia. Right coronary angiography was done using a Judkins R4 guide catheter. Left coronary angiography was done using a Judkins L4 guide catheter. Left ventriculography was done using a pigtail catheter. The pigtail catheter was pulled back into the aorta.A power injection of contrast was performed  to image the ascending aorta. A TR band was used for hemostasis of the right radial. Manual compression will be used for hemostasis of the right femoral site.  CONTRAST: Total of 100 cc.  COMPLICATIONS: None.  HEMODYNAMICS: Aortic pressure was 149/93; LV pressure was 146/6; LVEDP 15. There was no gradient between the left ventricle and aorta.  ANGIOGRAPHIC DATA: The left main coronary artery is widely patent.  The left anterior descending artery is a large vessel which reaches the apex. There is a couple of small diagonals which are patent. There is one large diagonal which is also widely patent.  The left circumflex artery is  a large vessel. There is a small ramus branch which is patent. The first obtuse marginal and second obtuse marginal are medium to large size vessels which are widely patent the remainder of the circumflex is patent the entire circumflex system is angiographically normal..  The right coronary artery is a large dominant vessel. There is a small area of ectasia in the proximal and. There is no significant atherosclerosis. Posterior lateral artery is patent. The posterior descending artery is patent.  LEFT VENTRICULOGRAM: Left ventricular angiogram was done in the 30 RAO projection and revealed normal left ventricular wall motion and systolic function with an estimated ejection fraction of 60 %. LVEDP was 16 mmHg.  ASCENDING AORTOGRAM: The descending aorta appears normal. The innominate artery is visualized. There appears to be a loop in the right subclavian. The carotid is visualized and is tortuous but patent. There is normal flow in the right subclavian system.  IMPRESSIONS:  1. Normal left main coronary artery. 2. Normal left anterior descending artery and its branches. 3. Normal left circumflex artery and its branches. 4. Mild ectasia in the proximal right coronary artery without significant atherosclerosis. 5. Normal left ventricular systolic function. LVEDP 16 mmHg. Ejection fraction 60 %. 6. Severe tortuosity of the right subclavian which prevented utilization of the right radial approach.  RECOMMENDATION: Continue medical therapy for her atrial fibrillation. Of note, due to tortuosity in her right subclavian, would not use right radial approach if future catheterization is needed.    Discharge Medications     Medication List         acetaminophen 500 MG tablet  Commonly known as:  TYLENOL  Take 1,000 mg by mouth every 6 (six) hours as needed for moderate pain.     apixaban 5 MG Tabs tablet  Commonly known as:  ELIQUIS  Take 1 tablet (5 mg total) by mouth 2 (two) times daily.      aspirin 81 MG EC tablet  Take 1 tablet (81 mg total) by mouth daily.     atenolol 25 MG tablet  Commonly known as:  TENORMIN  TAKE ONE TABLET BY MOUTH ONCE DAILY     Garcinia Cambogia-Chromium 500-200 MG-MCG Tabs  Take 2 tablets by mouth 2 (two) times daily.     lisinopril 10 MG tablet  Commonly known as:  PRINIVIL,ZESTRIL  TAKE ONE TABLET BY MOUTH ONCE DAILY     metFORMIN 500 MG 24 hr tablet  Commonly known as:  GLUCOPHAGE-XR  Take 1 tablet (500 mg total) by mouth every evening.  Start taking on:  09/05/2013     pantoprazole 40 MG tablet  Commonly known as:  PROTONIX  TAKE ONE TABLET BY MOUTH ONCE DAILY     simvastatin 40 MG tablet  Commonly known as:  ZOCOR  Take 1 tablet (40 mg total) by mouth every evening.  Disposition   The patient will be discharged in stable condition to home.  Future Appointments Provider Department Dept Phone   09/12/2013 2:10 PM Lendon Colonel, NP [facility] Linna Hoff [phone]     Follow-up Information   Follow up with Jory Sims, NP On 09/12/2013. (@2 :10pm ( Dr. Domenic Polite will be in the office at that time ) )    Specialty:  Nurse Practitioner   Contact information:   [address] [place] [address] [phone]         Duration of Discharge Encounter: Greater than 30 minutes including physician and PA time.  Signed, Perry Mount PA-C 09/04/2013, 11:56 AM

## 2013-09-04 NOTE — Discharge Instructions (Addendum)
Information on my medicine - ELIQUIS (apixaban)  This medication education was reviewed with me or my healthcare representative as part of my discharge preparation.  The pharmacist that spoke with me during my hospital stay was:  Juanda Chance Amend, [facility]  Why was Eliquis prescribed for you? Eliquis was prescribed for you to reduce the risk of a blood clot forming that can cause a stroke if you have a medical condition called atrial fibrillation (a type of irregular heartbeat).  What do You need to know about Eliquis ? Take your Eliquis TWICE DAILY - one tablet in the morning and one tablet in the evening with or without food. If you have difficulty swallowing the tablet whole please discuss with your pharmacist how to take the medication safely.  Take Eliquis exactly as prescribed by your doctor and DO NOT stop taking Eliquis without talking to the doctor who prescribed the medication.  Stopping may increase your risk of developing a stroke.  Refill your prescription before you run out.  After discharge, you should have regular check-up appointments with your healthcare provider that is prescribing your Eliquis.  In the future your dose may need to be changed if your kidney function or weight changes by a significant amount or as you get older.  What do you do if you miss a dose? If you miss a dose, take it as soon as you remember on the same day and resume taking twice daily.  Do not take more than one dose of ELIQUIS at the same time to make up a missed dose.  Important Safety Information A possible side effect of Eliquis is bleeding. You should call your healthcare provider right away if you experience any of the following:   Bleeding from an injury or your nose that does not stop.   Unusual colored urine (red or dark brown) or unusual colored stools (red or black).   Unusual bruising for unknown reasons.   A serious fall or if you hit your head (even if there is no  bleeding).  Some medicines may interact with Eliquis and might increase your risk of bleeding or clotting while on Eliquis. To help avoid this, consult your healthcare provider or pharmacist prior to using any new prescription or non-prescription medications, including herbals, vitamins, non-steroidal anti-inflammatory drugs (NSAIDs) and supplements.  This website has more information on Eliquis (apixaban): [email or web address].no.   Radial Site Care Refer to this sheet in the next few weeks. These instructions provide you with information on caring for yourself after your procedure. Your caregiver may also give you more specific instructions. Your treatment has been planned according to current medical practices, but problems sometimes occur. Call your caregiver if you have any problems or questions after your procedure. HOME CARE INSTRUCTIONS  You may shower the day after the procedure.Remove the bandage (dressing) and gently wash the site with plain soap and water.Gently pat the site dry.   Do not apply powder or lotion to the site.   Do not submerge the affected site in water for 3 to 5 days.   Inspect the site at least twice daily.   Do not flex or bend the affected arm for 24 hours.   No lifting over 5 pounds (2.3 kg) for 5 days after your procedure.   Do not drive home if you are discharged the same day of the procedure. Have someone else drive you.   You may drive 24 hours after the procedure unless otherwise instructed by your caregiver.  What to expect:  Any bruising will usually fade within 1 to 2 weeks.   Blood that collects in the tissue (hematoma) may be painful to the touch. It should usually decrease in size and tenderness within 1 to 2 weeks.  SEEK IMMEDIATE MEDICAL CARE IF:  You have unusual pain at the radial site.   You have redness, warmth, swelling, or pain at the radial site.   You have drainage (other than a small amount of blood on the dressing).   You  have chills.   You have a fever or persistent symptoms for more than 72 hours.   You have a fever and your symptoms suddenly get worse.   Your arm becomes pale, cool, tingly, or numb.   You have heavy bleeding from the site. Hold pressure on the site.

## 2013-09-04 NOTE — Progress Notes (Signed)
Patient Name: Monique Gomez Date of Encounter: 09/04/2013     Principal Problem:   Atrial fibrillation with RVR Active Problems:   ANEMIA   Diabetes mellitus   Atrial fibrillation with rapid ventricular response   HTN (hypertension)   Hyperlipidemia   NSTEMI (non-ST elevated myocardial infarction)    SUBJECTIVE  Feeling better. Still a little weak. No CP or SOB. No palpitations  CURRENT MEDS . aspirin EC  81 mg Oral Daily  . atenolol  25 mg Oral Daily  . atorvastatin  20 mg Oral q1800  . pantoprazole  40 mg Oral Daily  . sodium chloride  3 mL Intravenous Q12H  . sodium chloride  3 mL Intravenous Q12H    OBJECTIVE  Filed Vitals:   09/03/13 1835 09/03/13 1845 09/03/13 1949 09/04/13 0509  BP: 138/93 137/76 113/73 137/72  Pulse: 65 98 73 90  Temp: 97.8 F (36.6 C)  97.9 F (36.6 C) 98.3 F (36.8 C)  TempSrc:   Oral Oral  Resp: 18 20 20 20   Height:      Weight:      SpO2: 97% 98% 97% 98%    Intake/Output Summary (Last 24 hours) at 09/04/13 0751 Last data filed at 09/03/13 2242  Gross per 24 hour  Intake 608.23 ml  Output      0 ml  Net 608.23 ml   Filed Weights   09/02/13 0830 09/02/13 1220 09/03/13 0500  Weight: 304 lb (137.893 kg) 306 lb 7 oz (139 kg) 313 lb 0.9 oz (142 kg)    PHYSICAL EXAM  General: Pleasant, NAD. obese Neuro: Alert and oriented X 3. Moves all extremities spontaneously. Psych: Normal affect. HEENT:  Normal  Neck: Supple without bruits or JVD. Lungs:  Resp regular and unlabored, CTA. Heart: RRR no s3, s4, or murmurs. RRR, no gallop, 2/6 systolic murmur. Abdomen: Soft, non-tender, non-distended, BS + x 4.  Extremities: No clubbing, cyanosis or edema. DP/PT/Radials 2+ and equal bilaterally.  Accessory Clinical Findings  CBC  Recent Labs  09/02/13 0943 09/03/13 0445  WBC 9.5 10.4  NEUTROABS 7.3  --   HGB 12.2 11.1*  HCT 38.5 35.6*  MCV 65.4* 65.8*  PLT 321 956   Basic Metabolic Panel  Recent Labs  09/02/13 0943  09/03/13 0445  NA 141 142  K 4.2 4.0  CL 103 104  CO2 26 28  GLUCOSE 146* 122*  BUN 16 15  CREATININE 0.61 0.71  CALCIUM 9.1 9.1  MG 1.9  --    Liver Function Tests  Recent Labs  09/02/13 0943  AST 12  ALT 8  ALKPHOS 71  BILITOT <0.2*  PROT 7.5  ALBUMIN 3.2*    Cardiac Enzymes  Recent Labs  09/02/13 1635 09/02/13 2153 09/03/13 0445  TROPONINI 0.39* 0.50* 0.57*    Thyroid Function Tests  Recent Labs  09/02/13 0925  TSH 2.320    TELE  nsr  Radiology/Studies  Dg Chest Portable 1 View  09/02/2013   CLINICAL DATA:  Chest pain.  Hypertension.  EXAM: PORTABLE CHEST - 1 VIEW  COMPARISON:  DG CHEST 2 VIEW dated 06/04/2012  FINDINGS: Low volume chest. No gross airspace disease. Cardiopericardial silhouette appears within normal limits. The exam is under penetrated due to body habitus. Monitoring leads project over the chest.  IMPRESSION: Low volume chest without acute cardiopulmonary disease.     Echocardiogram (09/02/2013):  Study Conclusions - Left ventricle: The cavity size was normal. Wall thickness was increased in a pattern of  mild to moderateLVH. Systolic function was normal. The estimated ejection fraction was in the range of 60% to 65%. Wall motion was normal; there were no regional wall motion abnormalities. The study is not technically sufficient to allow evaluation of LV diastolic function. - Aortic valve: Mildly calcified annulus. Trileaflet. No significant regurgitation. - Mitral valve: Calcified annulus. Mild regurgitation. - Left atrium: The atrium was at the upper limits of normal in size. - Right ventricle: The cavity size was mildly dilated. - Right atrium: Central venous pressure: 86mm Hg (est). - Atrial septum: No defect or patent foramen ovale was identified. - Tricuspid valve: Mild regurgitation. - Pulmonary arteries: PA peak pressure: 46mm Hg (S). - Pericardium, extracardiac: There was no pericardial effusion. Impressions: - Mild to  moderate LVH with LVEF 60-65%. Indeterminate diastolic function. Upper normal left atrial size. MAC with mild mitral regurgitation. Mild RV enlargement. Mild tricuspid regurgitation with PASP 33 mmHg.  09/03/13 PROCEDURE: Left heart catheterization with selective coronary angiography, left ventriculogram. Ascending aortogram.  INDICATIONS: NSTEMI  The risks, benefits, and details of the procedure were explained to the patient. The patient verbalized understanding and wanted to proceed. Informed written consent was obtained.  PROCEDURE TECHNIQUE: After Xylocaine anesthesia a 70F slender sheath was placed in the right radial artery with a single anterior needle wall stick. The JR 4 catheter and the versacore wire continued to select the Right common carotid. A hand injection of contrast was performed through the JR 4 catheter in the right subclavian artery. The catheter was then advanced and an additional injection was performed revealing an extremely tortuous proximal right subclavian. Since the wires continue to select the vertebral as well as carotid, we abandoned the radial approach. Femoral access in a sterile fashion was obtained after Xylocaine anesthesia. Right coronary angiography was done using a Judkins R4 guide catheter. Left coronary angiography was done using a Judkins L4 guide catheter. Left ventriculography was done using a pigtail catheter. The pigtail catheter was pulled back into the aorta.A power injection of contrast was performed to image the ascending aorta. A TR band was used for hemostasis of the right radial. Manual compression will be used for hemostasis of the right femoral site.  CONTRAST: Total of 100 cc.  COMPLICATIONS: None.  HEMODYNAMICS: Aortic pressure was 149/93; LV pressure was 146/6; LVEDP 15. There was no gradient between the left ventricle and aorta.  ANGIOGRAPHIC DATA: The left main coronary artery is widely patent.  The left anterior descending artery is a large  vessel which reaches the apex. There is a couple of small diagonals which are patent. There is one large diagonal which is also widely patent.  The left circumflex artery is a large vessel. There is a small ramus branch which is patent. The first obtuse marginal and second obtuse marginal are medium to large size vessels which are widely patent the remainder of the circumflex is patent the entire circumflex system is angiographically normal..  The right coronary artery is a large dominant vessel. There is a small area of ectasia in the proximal and. There is no significant atherosclerosis. Posterior lateral artery is patent. The posterior descending artery is patent.  LEFT VENTRICULOGRAM: Left ventricular angiogram was done in the 30 RAO projection and revealed normal left ventricular wall motion and systolic function with an estimated ejection fraction of 60 %. LVEDP was 16 mmHg.  ASCENDING AORTOGRAM: The descending aorta appears normal. The innominate artery is visualized. There appears to be a loop in the right subclavian. The  carotid is visualized and is tortuous but patent. There is normal flow in the right subclavian system.  IMPRESSIONS:  1. Normal left main coronary artery. 2. Normal left anterior descending artery and its branches. 3. Normal left circumflex artery and its branches. 4. Mild ectasia in the proximal right coronary artery without significant atherosclerosis. 5. Normal left ventricular systolic function. LVEDP 16 mmHg. Ejection fraction 60 %. 6. Severe tortuosity of the right subclavian which prevented utilization of the right radial approach.  RECOMMENDATION: Continue medical therapy for her atrial fibrillation. Of note, due to tortuosity in her right subclavian, would not use right radial approach if future catheterization is needed.   ASSESSMENT AND PLAN Monique Gomez is a 58 y.o. female with a history of diabetes mellitus, HTN, hyperlipidemia and morbid obesity who  presented on 09/02/13 with newly documented afib with RVR. She was also found to have a NSTEMI and was transferred to [facility] for cardiac cath and further ischemic workup.   New-onset atrial fibrillation with RVR. CHADSVASC score is 3.  -- TSH normal. Drug screen unremarkable. -- She converted to sinus rhythm on Cardizem and with single dose Flecainide.  -- Remains on Heparin GTT, will need to be converted to an oral agent before discharge. -- ECHO on 09/02/13 with moderate LVH. EF 60-65%. Indeterminate diastolic function. Upper normal LA size. MAC with mild mitral regurgitation. Mild RV enlargement. Mild TR with PASP 33 mmHg.  NSTEMI- type 2 with atrial fibrillation and RVR. Peak troponin I. 0.57.  -- Normal left main, LAD and branches, LCx, mild ectasia in the pRCA without significant atherosclerosis, normal left ventricular systolic function. LVEDP 16 mmHg. Ejection fraction 60 %. There was severe tortuosity of the right subclavian which prevented utilization of the right radial approach.  -- Continue ASA, BB and statin  Hypertension- well controlled.   Type 2 diabetes mellitus  Obesity- counseled on diet and exercise  Signed, [facility]  Pager [phone]

## 2013-09-04 NOTE — Care Management Note (Signed)
    Page 1 of 1   09/04/2013     2:37:33 PM CARE MANAGEMENT NOTE 09/04/2013  Patient:  Monique Gomez, Monique Gomez   Account Number:  [id number]  Date Initiated:  09/04/2013  Documentation initiated by:  Damiya Sandefur  Subjective/Objective Assessment:   Pt adm on 09/02/13 with AFIB, pos trop.  PTA, pt independent of ADLs.     Action/Plan:   Pt to dc on Eliquis therapy.  Free 30 day trial card and copay card given to pt.  Pt will get first month free with trial card; after that, with copay card Rx will cost $10/month.  Pt aware cards need to be activated.  She is appreciative.   Anticipated DC Date:  09/04/2013   Anticipated DC Plan:  [place]  CM consult  Medication Assistance      Choice offered to / List presented to:             Status of service:  Completed, signed off Medicare Important Message given?   (If response is "NO", the following Medicare IM given date fields will be blank) Date Medicare IM given:   Date Additional Medicare IM given:    Discharge Disposition:  HOME/SELF CARE  Per UR Regulation:  Reviewed for med. necessity/level of care/duration of stay  If discussed at [place] of Stay Meetings, dates discussed:    Comments:

## 2013-09-05 NOTE — Discharge Summary (Signed)
No local groin cath complications She is ready for discharge. Needs OP sleep study to r/o OSA as trigger for AF. Needs at least 3 weeks of oral anticoagulation (NOAC if she can afford). She will f/u with Dr. Domenic Polite whom she identifies as her primary cardiologist.

## 2013-09-06 ENCOUNTER — Encounter: Payer: Self-pay | Admitting: *Deleted

## 2013-09-06 ENCOUNTER — Encounter: Payer: Self-pay | Admitting: Family Medicine

## 2013-09-06 ENCOUNTER — Ambulatory Visit (INDEPENDENT_AMBULATORY_CARE_PROVIDER_SITE_OTHER): Payer: BC Managed Care – PPO | Admitting: Family Medicine

## 2013-09-06 VITALS — BP 138/76 | HR 78 | Temp 98.7°F | Resp 16 | Ht 62.0 in | Wt 300.0 lb

## 2013-09-06 DIAGNOSIS — E669 Obesity, unspecified: Secondary | ICD-10-CM

## 2013-09-06 DIAGNOSIS — I10 Essential (primary) hypertension: Secondary | ICD-10-CM

## 2013-09-06 DIAGNOSIS — I4891 Unspecified atrial fibrillation: Secondary | ICD-10-CM

## 2013-09-06 DIAGNOSIS — E785 Hyperlipidemia, unspecified: Secondary | ICD-10-CM

## 2013-09-06 DIAGNOSIS — I480 Paroxysmal atrial fibrillation: Secondary | ICD-10-CM | POA: Insufficient documentation

## 2013-09-06 DIAGNOSIS — K219 Gastro-esophageal reflux disease without esophagitis: Secondary | ICD-10-CM

## 2013-09-06 DIAGNOSIS — M545 Low back pain, unspecified: Secondary | ICD-10-CM

## 2013-09-06 DIAGNOSIS — E119 Type 2 diabetes mellitus without complications: Secondary | ICD-10-CM

## 2013-09-06 DIAGNOSIS — G43909 Migraine, unspecified, not intractable, without status migrainosus: Secondary | ICD-10-CM

## 2013-09-06 LAB — LIPID PANEL
CHOL/HDL RATIO: 4.9 ratio
CHOLESTEROL: 176 mg/dL (ref 0–200)
HDL: 36 mg/dL — ABNORMAL LOW (ref >39)
LDL Cholesterol: 126 mg/dL — ABNORMAL HIGH (ref 0–99)
Triglycerides: 71 mg/dL (ref <150)
VLDL: 14 mg/dL (ref 0–40)

## 2013-09-06 LAB — HEMOGLOBIN A1C
HEMOGLOBIN A1C: 6.4 % — AB (ref <5.7)
MEAN PLASMA GLUCOSE: 137 mg/dL — AB (ref <117)

## 2013-09-06 MED ORDER — ATENOLOL 25 MG PO TABS: ORAL_TABLET | ORAL | Status: DC

## 2013-09-06 MED ORDER — SIMVASTATIN 40 MG PO TABS: 40.0000 mg | ORAL_TABLET | Freq: Every evening | ORAL | Status: DC

## 2013-09-06 MED ORDER — LISINOPRIL 10 MG PO TABS: ORAL_TABLET | ORAL | Status: DC

## 2013-09-06 MED ORDER — PANTOPRAZOLE SODIUM 40 MG PO TBEC: DELAYED_RELEASE_TABLET | ORAL | Status: DC

## 2013-09-06 MED ORDER — METFORMIN HCL ER 500 MG PO TB24: 500.0000 mg | ORAL_TABLET | Freq: Every evening | ORAL | Status: DC

## 2013-09-06 MED ORDER — CYCLOBENZAPRINE HCL 10 MG PO TABS: 10.0000 mg | ORAL_TABLET | Freq: Three times a day (TID) | ORAL | Status: DC | PRN

## 2013-09-06 MED ORDER — HYDROCODONE-ACETAMINOPHEN 5-325 MG PO TABS: 1.0000 | ORAL_TABLET | Freq: Four times a day (QID) | ORAL | Status: DC | PRN

## 2013-09-06 NOTE — Assessment & Plan Note (Signed)
Recheck A1c she is on metformin 500 mg once a day as well as ACE inhibitor and statin drug

## 2013-09-06 NOTE — Assessment & Plan Note (Signed)
Check fasting lipid panel she's currently on Zocor goal is to keep LDL below 100

## 2013-09-06 NOTE — Progress Notes (Signed)
Patient ID: Monique Gomez, female   DOB: 04/21/1956, 58 y.o.   MRN: [id number]   Subjective:    Patient ID: Monique Gomez, female    DOB: October 16, 1955, 58 y.o.   MRN: [id number]  Patient presents for Hospital F/U  patient here for hospital followup. She was actually seen in the emergency room one April 29 secondary to a fall where she was walking out of her home and tripped on a threshold and hit her back on the concrete. She had imaging done which was negative. Subsequently she had acute shortness of breath and went into A. fib with RVR she was admitted to the hospital had cardiac catheterization which was negative. She's continued on atenolol and eloquent was added for anticoagulation. She is followup with cardiology. She still has some lower back pain which she is using some hydrocodone as well as Flexeril and Naprosyn. She's not had any chest pain or shortness of breath recently has followup with cardiology in a couple weeks. She is very fatigued from the hospitalization and A. fib.    Review Of Systems:  GEN-+fatigue, fever, weight loss,weakness, recent illness HEENT- denies eye drainage, change in vision, nasal discharge, CVS- denies chest pain, palpitations RESP- denies SOB, cough, wheeze ABD- denies N/V, change in stools, abd pain GU- denies dysuria, hematuria, dribbling, incontinence MSK- + joint pain, muscle aches, injury Neuro- denies headache, dizziness, syncope, seizure activity       Objective:    BP 138/76  Pulse 78  Temp(Src) 98.7 F (37.1 C) (Oral)  Resp 16  Ht 5\' 2"  (1.575 m)  Wt 300 lb (136.079 kg)  BMI 54.86 kg/m2 GEN- NAD, alert and oriented x3 HEENT- PERRL, EOMI, non injected sclera, pink conjunctiva, MMM, oropharynx clear Neck- Supple,  CVS- RRR, no murmur RESP-CTAB EXT- No edema Pulses- Radial, DP- 2+ MSK- Mild TTP lumbar spine, neg SLR, decreased ROM spine, TTP paraspinals        Assessment & Plan:      Problem List Items Addressed This  Visit   OBESITY   Relevant Medications      metFORMIN (GLUCOPHAGE-XR) 24 hr tablet   MIGRAINE HEADACHE   Relevant Medications      NORCO 5-325 MG PO TABS      cyclobenzaprine (FLEXERIL) tablet      simvastatin (ZOCOR) tablet      atenolol (TENORMIN) tablet      lisinopril (PRINIVIL,ZESTRIL) tablet   Lumbar back pain   Relevant Medications      NORCO 5-325 MG PO TABS      cyclobenzaprine (FLEXERIL) tablet   HYPERLIPIDEMIA - Primary   Relevant Medications      simvastatin (ZOCOR) tablet      atenolol (TENORMIN) tablet      lisinopril (PRINIVIL,ZESTRIL) tablet   Other Relevant Orders      Lipid panel   Diabetes mellitus   Relevant Medications      simvastatin (ZOCOR) tablet      metFORMIN (GLUCOPHAGE-XR) 24 hr tablet      lisinopril (PRINIVIL,ZESTRIL) tablet   Other Relevant Orders      Hemoglobin A1c   Atrial fibrillation   Relevant Medications      simvastatin (ZOCOR) tablet      atenolol (TENORMIN) tablet      lisinopril (PRINIVIL,ZESTRIL) tablet    Other Visit Diagnoses   HTN (hypertension)        Relevant Medications       simvastatin (ZOCOR) tablet  atenolol (TENORMIN) tablet       lisinopril (PRINIVIL,ZESTRIL) tablet    GERD (gastroesophageal reflux disease)        Relevant Medications       pantoprazole (PROTONIX) EC tablet       Note: This dictation was prepared with Dragon dictation along with smaller phrase technology. Any transcriptional errors that result from this process are unintentional.

## 2013-09-06 NOTE — Patient Instructions (Signed)
We will call with lab results Continue current medications Pain meds refilled F/U 3 months

## 2013-09-06 NOTE — Assessment & Plan Note (Signed)
She converted spontaneously without cardioversion. She is on beta blocker as well as eliquis

## 2013-09-06 NOTE — Assessment & Plan Note (Signed)
Her x-rays were negative. She does have some strain as well as some bruising in the muscles. I have provided her with another prescription for Flexeril as well as hydrocodone. Secondary to starting the new blood thinner I will have her stop using any anti-inflammatories at this time. I think that she would benefit from being out of work for the next month to recover from her her back the back injuries and hospitalization. 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 J then she is to return to work on June 8 oh will see her on the Friday before she returns to work for clearance

## 2013-09-06 NOTE — Assessment & Plan Note (Signed)
Blood pressure is well-controlled no change current medication

## 2013-09-09 ENCOUNTER — Other Ambulatory Visit: Payer: Self-pay | Admitting: *Deleted

## 2013-09-09 MED ORDER — ATORVASTATIN CALCIUM 20 MG PO TABS: 20.0000 mg | ORAL_TABLET | Freq: Every day | ORAL | Status: DC

## 2013-09-11 ENCOUNTER — Ambulatory Visit: Payer: BC Managed Care – PPO | Admitting: Adult Health

## 2013-09-12 ENCOUNTER — Encounter: Payer: Self-pay | Admitting: Adult Health

## 2013-09-12 ENCOUNTER — Ambulatory Visit (INDEPENDENT_AMBULATORY_CARE_PROVIDER_SITE_OTHER): Payer: BC Managed Care – PPO | Admitting: Adult Health

## 2013-09-12 VITALS — BP 110/70 | HR 76 | Ht 62.0 in | Wt 304.0 lb

## 2013-09-12 DIAGNOSIS — I214 Non-ST elevation (NSTEMI) myocardial infarction: Secondary | ICD-10-CM

## 2013-09-12 DIAGNOSIS — E669 Obesity, unspecified: Secondary | ICD-10-CM

## 2013-09-12 DIAGNOSIS — G4733 Obstructive sleep apnea (adult) (pediatric): Secondary | ICD-10-CM

## 2013-09-12 DIAGNOSIS — I4891 Unspecified atrial fibrillation: Secondary | ICD-10-CM

## 2013-09-12 NOTE — Progress Notes (Signed)
HPI: Mrs. Apple is a 58 year old patient of Dr. Domenic Polite were following posthospitalization after admission for new onset atrial fibrillation with RVR complicated by mild non-ST elevated MI. The patient was converted to normal sinus rhythm on Cardizem with a single dose of flecainide. The patient was started on Eliquis 5 mg twice a day. Echocardiogram completed during hospitalization revealed moderate LVH with an EF of 60-65%. LA size is upper limits of normal. MAC with mild mitral regurg. Mild RV enlargement, mild TR, with PAS P. of 33 mm mercury. He was recommended that she have an outpatient sleep study to rule out obstructive sleep apnea as trigger for atrial fib.  Due to non-ST elevation MI type II with atrial fibrillation, the patient had a left heart catheterization on 09/03/2013. She had normal left main, LAD, and branches, L. left circumflex, mild ectasia in the proximal RCA, without significant atherosclerosis. LVEF is 60%. There was severe tortuosity of the right subclavian which prevented utilization of the right radial approach. She was continued on aspirin, beta blocker, and statin. She was continued on treatment for diabetes, and counseled on diet and exercise for weight loss in the setting of obesity.  She comes today following tired but is remaining active, has lost 10 pounds with diet and exercise. She has not yet been referred for a sleep study at the request of Dr. Buelah Manis. She has not yet returned to work, is due to return to work in June. She has had no recurrence of rapid heart rhythm, she has had no complaints of bleeding or dyspnea on exertion. She is medically compliant. Her only complaint is chronic moderate lower back pain. She is reluctant to take narcotics that have been prescribed to her, but will do so when it becomes unmanageable.     No Known Allergies  Current Outpatient Prescriptions  Medication Sig Dispense Refill  . acetaminophen (TYLENOL) 500 MG tablet Take  1,000 mg by mouth every 6 (six) hours as needed for moderate pain.       Marland Kitchen apixaban (ELIQUIS) 5 MG TABS tablet Take 1 tablet (5 mg total) by mouth 2 (two) times daily.  60 tablet  11  . aspirin EC 81 MG EC tablet Take 1 tablet (81 mg total) by mouth daily.      Marland Kitchen atenolol (TENORMIN) 25 MG tablet TAKE ONE TABLET BY MOUTH ONCE DAILY  90 tablet  1  . HYDROcodone-acetaminophen (NORCO) 5-325 MG per tablet Take 1 tablet by mouth every 6 (six) hours as needed for moderate pain.  45 tablet  0  . lisinopril (PRINIVIL,ZESTRIL) 10 MG tablet TAKE ONE TABLET BY MOUTH ONCE DAILY  90 tablet  1  . metFORMIN (GLUCOPHAGE-XR) 500 MG 24 hr tablet Take 1 tablet (500 mg total) by mouth every evening.  90 tablet  1  . pantoprazole (PROTONIX) 40 MG tablet TAKE ONE TABLET BY MOUTH ONCE DAILY  90 tablet  1  . simvastatin (ZOCOR) 40 MG tablet        No current facility-administered medications for this visit.    Past Medical History  Diagnosis Date  . Allergic rhinitis   . Obesity   . Hyperlipidemia   . Essential hypertension, benign   . History of migraine headaches   . Type 2 diabetes mellitus   . Anemia   . PAF (paroxysmal atrial fibrillation) 08/2013  . GERD (gastroesophageal reflux disease)   . QQIWLNLG(921.1)     Past Surgical History  Procedure Laterality Date  . Right  carpal tunnel release      ROS: Review of systems complete and found to be negative unless listed above  PHYSICAL EXAM BP 110/70  Pulse 76  Ht 5\' 2"  (1.575 m)  Wt 304 lb (137.893 kg)  BMI 55.59 kg/m2 General: Well developed, well nourished, in no acute distress Head: Eyes PERRLA, No xanthomas.   Normal cephalic and atramatic  Lungs: Clear bilaterally to auscultation and percussion. Heart: HRRR S1 S2, without MRG.  Pulses are 2+ & equal.            No carotid bruit. No JVD.  No abdominal bruits. No femoral bruits. Abdomen: Bowel sounds are positive, abdomen soft and non-tender without masses or                  Hernia's  noted. Msk:  Back normal, normal gait. Normal strength and tone for age. Extremities: No clubbing, cyanosis or edema.  DP +1 Neuro: Alert and oriented X 3. Psych:  Good affect, responds appropriately   ASSESSMENT AND PLAN

## 2013-09-12 NOTE — Progress Notes (Deleted)
Name: Monique Gomez    DOB: 12-Feb-1956  Age: 58 y.o.  MR#: [id number]       PCP:  Vic Blackbird, MD      Insurance: Payor: [place] / Plan: [employer] [place] PPO / Product Type: *No Product type* /   CC:    Chief Complaint  Patient presents with  . Atrial Fibrillation  . Hypertension    VS Filed Vitals:   09/12/13 1411  BP: 110/70  Pulse: 76  Height: 5\' 2"  (1.575 m)  Weight: 304 lb (137.893 kg)    Weights Current Weight  09/12/13 304 lb (137.893 kg)  09/06/13 300 lb (136.079 kg)  09/03/13 313 lb 0.9 oz (142 kg)    Blood Pressure  BP Readings from Last 3 Encounters:  09/12/13 110/70  09/06/13 138/76  09/04/13 137/72     Admit date:  (Not on file) Last encounter with RMR:  Visit date not found   Allergy Review of patient's allergies indicates no known allergies.  Current Outpatient Prescriptions  Medication Sig Dispense Refill  . acetaminophen (TYLENOL) 500 MG tablet Take 1,000 mg by mouth every 6 (six) hours as needed for moderate pain.       Marland Kitchen apixaban (ELIQUIS) 5 MG TABS tablet Take 1 tablet (5 mg total) by mouth 2 (two) times daily.  60 tablet  11  . aspirin EC 81 MG EC tablet Take 1 tablet (81 mg total) by mouth daily.      Marland Kitchen atenolol (TENORMIN) 25 MG tablet TAKE ONE TABLET BY MOUTH ONCE DAILY  90 tablet  1  . HYDROcodone-acetaminophen (NORCO) 5-325 MG per tablet Take 1 tablet by mouth every 6 (six) hours as needed for moderate pain.  45 tablet  0  . lisinopril (PRINIVIL,ZESTRIL) 10 MG tablet TAKE ONE TABLET BY MOUTH ONCE DAILY  90 tablet  1  . metFORMIN (GLUCOPHAGE-XR) 500 MG 24 hr tablet Take 1 tablet (500 mg total) by mouth every evening.  90 tablet  1  . pantoprazole (PROTONIX) 40 MG tablet TAKE ONE TABLET BY MOUTH ONCE DAILY  90 tablet  1  . simvastatin (ZOCOR) 40 MG tablet        No current facility-administered medications for this visit.    Discontinued Meds:    Medications Discontinued During This Encounter  Medication Reason  .  atorvastatin (LIPITOR) 20 MG tablet Error  . cyclobenzaprine (FLEXERIL) 10 MG tablet Error  . Garcinia Cambogia-Chromium 500-200 MG-MCG TABS Error    Patient Active Problem List   Diagnosis Date Noted  . Atrial fibrillation 09/06/2013  . Lumbar back pain 09/06/2013  . NSTEMI (non-ST elevated myocardial infarction) 09/03/2013  . MDD (major depressive disorder), single episode 02/04/2013  . Anxiety state, unspecified 02/04/2013  . Diabetes mellitus 06/06/2012  . Carpal tunnel syndrome of left wrist 12/27/2011  . De Quervain's disease (tenosynovitis) 10/09/2011  . Heartburn 02/27/2011  . Allergic rhinitis 08/29/2010  . ANEMIA 02/15/2010  . HYPERLIPIDEMIA 12/23/2009  . OBESITY 12/23/2009  . MIGRAINE HEADACHE 12/23/2009  . Essential hypertension, benign 12/23/2009    LABS    Component Value Date/Time   NA 142 09/03/2013 0445   NA 141 09/02/2013 0943   NA 141 02/04/2013 1128   K 4.0 09/03/2013 0445   K 4.2 09/02/2013 0943   K 4.1 02/04/2013 1128   CL 104 09/03/2013 0445   CL 103 09/02/2013 0943   CL 102 02/04/2013 1128   CO2 28 09/03/2013 0445   CO2 26 09/02/2013 0943  CO2 31 02/04/2013 1128   GLUCOSE 122* 09/03/2013 0445   GLUCOSE 146* 09/02/2013 0943   GLUCOSE 102* 02/04/2013 1128   BUN 15 09/03/2013 0445   BUN 16 09/02/2013 0943   BUN 13 02/04/2013 1128   CREATININE 0.71 09/03/2013 0445   CREATININE 0.61 09/02/2013 0943   CREATININE 0.71 02/04/2013 1128   CREATININE 0.78 06/04/2012 1008   CREATININE 0.70 08/27/2010 0913   CALCIUM 9.1 09/03/2013 0445   CALCIUM 9.1 09/02/2013 0943   CALCIUM 9.6 02/04/2013 1128   GFRNONAA >90 09/03/2013 0445   GFRNONAA >90 09/02/2013 0943   GFRNONAA >90 06/04/2012 1008   GFRAA >90 09/03/2013 0445   GFRAA >90 09/02/2013 0943   GFRAA >90 06/04/2012 1008   CMP     Component Value Date/Time   NA 142 09/03/2013 0445   K 4.0 09/03/2013 0445   CL 104 09/03/2013 0445   CO2 28 09/03/2013 0445   GLUCOSE 122* 09/03/2013 0445   BUN 15 09/03/2013 0445   CREATININE 0.71 09/03/2013 0445   CREATININE  0.71 02/04/2013 1128   CALCIUM 9.1 09/03/2013 0445   PROT 7.5 09/02/2013 0943   ALBUMIN 3.2* 09/02/2013 0943   AST 12 09/02/2013 0943   ALT 8 09/02/2013 0943   ALKPHOS 71 09/02/2013 0943   BILITOT <0.2* 09/02/2013 0943   GFRNONAA >90 09/03/2013 0445   GFRAA >90 09/03/2013 0445       Component Value Date/Time   WBC 10.4 09/03/2013 0445   WBC 9.5 09/02/2013 0943   WBC 9.0 02/04/2013 1128   HGB 11.1* 09/03/2013 0445   HGB 12.2 09/02/2013 0943   HGB 12.2 02/04/2013 1128   HCT 35.6* 09/03/2013 0445   HCT 38.5 09/02/2013 0943   HCT 39.0 02/04/2013 1128   MCV 65.8* 09/03/2013 0445   MCV 65.4* 09/02/2013 0943   MCV 64.7* 02/04/2013 1128    Lipid Panel     Component Value Date/Time   CHOL 176 09/06/2013 1153   TRIG 71 09/06/2013 1153   HDL 36* 09/06/2013 1153   CHOLHDL 4.9 09/06/2013 1153   VLDL 14 09/06/2013 1153   LDLCALC 126* 09/06/2013 1153    ABG No results found for this basename: phart, pco2, pco2art, po2, po2art, hco3, tco2, acidbasedef, o2sat     Lab Results  Component Value Date   TSH 2.320 09/02/2013   BNP (last 3 results) No results found for this basename: PROBNP,  in the last 8760 hours Cardiac Panel (last 3 results) No results found for this basename: CKTOTAL, CKMB, TROPONINI, RELINDX,  in the last 72 hours  Iron/TIBC/Ferritin    Component Value Date/Time   IRON 31* 08/27/2010 0913   TIBC 371 08/27/2010 0913   FERRITIN 68 08/27/2010 0913     EKG Orders placed during the hospital encounter of 09/02/13  . EKG 12-LEAD  . EKG 12-LEAD  . EKG 12-LEAD  . EKG 12-LEAD  . EKG 12-LEAD  . EKG 12-LEAD  . EKG 12-LEAD  . EKG 12-LEAD  . EKG     Prior Assessment and Plan Problem List as of 09/12/2013     Cardiovascular and Mediastinum   MIGRAINE HEADACHE   Essential hypertension, benign   Last Assessment & Plan   09/06/2013 Office Visit Written 09/06/2013  1:58 PM by Alycia Rossetti, MD     Blood pressure is well-controlled no change current medication    NSTEMI (non-ST elevated myocardial infarction)    Atrial fibrillation   Last Assessment & Plan   09/06/2013 Office Visit  Written 09/06/2013  1:58 PM by Alycia Rossetti, MD     She converted spontaneously without cardioversion. She is on beta blocker as well as eliquis      Respiratory   Allergic rhinitis   Last Assessment & Plan   08/27/2010 Office Visit Written 08/29/2010 10:18 AM by Tula Nakayama, MD     Deteriorate, injections administered in office and med prescribed      Endocrine   Diabetes mellitus   Last Assessment & Plan   09/06/2013 Office Visit Written 09/06/2013  1:58 PM by Alycia Rossetti, MD     Recheck A1c she is on metformin 500 mg once a day as well as ACE inhibitor and statin drug      Nervous and Auditory   Carpal tunnel syndrome of left wrist     Musculoskeletal and Integument   De Quervain's disease (tenosynovitis)   Last Assessment & Plan   06/05/2012 Office Visit Written 06/06/2012  9:40 PM by Alycia Rossetti, MD     She has had recurrent symptoms, advised restart NSAIDS, call ortho if needed      Other   [place]   09/06/2013 Office Visit Written 09/06/2013  1:57 PM by Alycia Rossetti, MD     Check fasting lipid panel she's currently on Zocor goal is to keep LDL below 100    OBESITY   Last Assessment & Plan   06/05/2012 Office Visit Written 06/06/2012  9:42 PM by Alycia Rossetti, MD     No weight loss since last visit, discussed need for diet and exercise    ANEMIA   Last Assessment & Plan   06/05/2012 Office Visit Written 06/06/2012  9:39 PM by Alycia Rossetti, MD     Very mild, HB stable    Heartburn   Last Assessment & Plan   06/05/2012 Office Visit Written 06/06/2012  9:41 PM by Alycia Rossetti, MD     Discussed foods to avoid, PPI increased for next 2 weeks, then return to once a day    MDD (major depressive disorder), single episode   Last Assessment & Plan   02/04/2013 Office Visit Written 02/04/2013 10:12 PM by Alycia Rossetti, MD     Prolonged grief, start effexor  daily, therapy established ,contracted for saftey    Anxiety state, unspecified   Last Assessment & Plan   02/04/2013 Office Visit Edited 02/04/2013 10:13 PM by Alycia Rossetti, MD     Very overwhelmed with little support Start meds Ativan at bedtime for sleep- will use short term until SNRI established    Lumbar back pain   Last Assessment & Plan   09/06/2013 Office Visit Written 09/06/2013  1:59 PM by Alycia Rossetti, MD     Her x-rays were negative. She does have some strain as well as some bruising in the muscles. I have provided her with another prescription for Flexeril as well as hydrocodone. Secondary to starting the new blood thinner I will have her stop using any anti-inflammatories at this time. I think that she would benefit from being out of work for the next month to recover from her her back the back injuries and hospitalization. 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 J then she is to return  to work on June 8 oh will see her on the Friday before she returns to work for clearance        Imaging: Dg Lumbar Spine Complete  08/28/2013   CLINICAL DATA:  Fall, back pain  EXAM: LUMBAR SPINE - COMPLETE 4+ VIEW  COMPARISON:  Chest x-ray 02/11/2010  FINDINGS: There are 5 nonrib bearing lumbar-type vertebral bodies. The vertebral body heights are maintained. The alignment is anatomic. There is no spondylolysis. There is no acute fracture or static listhesis. The disc spaces are maintained.  The SI joints are unremarkable.  IMPRESSION: No acute osseous injury of the lumbar spine.   Electronically Signed   By: Kathreen Devoid   On: 08/28/2013 10:30   Dg Sacrum/coccyx  08/28/2013   CLINICAL DATA:  Fall, back pain  EXAM: SACRUM AND COCCYX - 2+ VIEW  COMPARISON:  None.  FINDINGS: There is no evidence of fracture or other focal bone lesions  IMPRESSION:  Negative.   Electronically Signed   By: Kathreen Devoid   On: 08/28/2013 10:33   Dg Hip Bilateral W/pelvis  08/28/2013   CLINICAL DATA:  Fall, back pain  EXAM: BILATERAL HIP WITH PELVIS - 4+ VIEW  COMPARISON:  None.  FINDINGS: There is no evidence of hip fracture or dislocation. There is no evidence of arthropathy or other focal bone abnormality.  IMPRESSION: No acute osseous injury of bilateral hips.   Electronically Signed   By: Kathreen Devoid   On: 08/28/2013 10:29   Dg Chest Portable 1 View  09/02/2013   CLINICAL DATA:  Chest pain.  Hypertension.  EXAM: PORTABLE CHEST - 1 VIEW  COMPARISON:  DG CHEST 2 VIEW dated 06/04/2012  FINDINGS: Low volume chest. No gross airspace disease. Cardiopericardial silhouette appears within normal limits. The exam is under penetrated due to body habitus. Monitoring leads project over the chest.  IMPRESSION: Low volume chest without acute cardiopulmonary disease.   Electronically Signed   By: Dereck Ligas M.D.   On: 09/02/2013 09:01

## 2013-09-12 NOTE — Assessment & Plan Note (Signed)
She is continued asymptomatic without recurrence of discomfort. Her main complaint is overall fatigue and chronic lower back pain. She will continue on aspirin and beta blocker along with statin therapy. We will see her again in one month unless she is symptomatic.

## 2013-09-12 NOTE — Patient Instructions (Signed)
Your physician recommends that you schedule a follow-up appointment in: 1 month  Your physician has recommended that you have a sleep study. This test records several body functions during sleep, including: brain activity, eye movement, oxygen and carbon dioxide blood levels, heart rate and rhythm, breathing rate and rhythm, the flow of air through your mouth and nose, snoring, body muscle movements, and chest and belly movement.  Try taking Atenolol at night.

## 2013-09-12 NOTE — Assessment & Plan Note (Signed)
She is adhering to a heart healthy diet and walking daily. She has lost 10 pounds. I have congratulated her on this lifestyle change, and encouraged her to continue this.

## 2013-09-12 NOTE — Assessment & Plan Note (Signed)
She remains in normal sinus rhythm. She continues on Eliquis  5 mg twice a day. She denies any bleeding.  She has been referred to [facility] sleep study lab to evaluate for obstructive sleep apnea which contributes to atrial fibrillation in the setting of morbid obesity and chronic snoring.  A copy of the results will go to Dr. Erline Hau her primary care physician. I have explained to her that she may need referral to pulmonologist for institution of CPAP and management thereof should she be positive for sleep apnea. She verbalizes understanding

## 2013-10-04 ENCOUNTER — Ambulatory Visit (INDEPENDENT_AMBULATORY_CARE_PROVIDER_SITE_OTHER): Payer: BC Managed Care – PPO | Admitting: Family Medicine

## 2013-10-04 ENCOUNTER — Ambulatory Visit: Payer: BC Managed Care – PPO | Admitting: Family Medicine

## 2013-10-04 ENCOUNTER — Encounter: Payer: Self-pay | Admitting: Family Medicine

## 2013-10-04 VITALS — BP 144/80 | HR 76 | Temp 98.6°F | Resp 16 | Ht 62.5 in | Wt 306.0 lb

## 2013-10-04 DIAGNOSIS — E669 Obesity, unspecified: Secondary | ICD-10-CM

## 2013-10-04 DIAGNOSIS — I4891 Unspecified atrial fibrillation: Secondary | ICD-10-CM

## 2013-10-04 DIAGNOSIS — M545 Low back pain, unspecified: Secondary | ICD-10-CM

## 2013-10-04 DIAGNOSIS — I10 Essential (primary) hypertension: Secondary | ICD-10-CM

## 2013-10-04 NOTE — Assessment & Plan Note (Signed)
Blood pressure is well controlled 

## 2013-10-04 NOTE — Patient Instructions (Signed)
Continue current medications Work on the diet and exercise  F/U 2 months

## 2013-10-04 NOTE — Assessment & Plan Note (Signed)
Her back pain is slowly improving. Her x-rays were negative. She does have pain medication to use as needed. We discussed strengthening of her lower back as well as weight loss to also help with recovery.

## 2013-10-04 NOTE — Assessment & Plan Note (Signed)
She is in sinus rhythm and is been doing well on her medications. She is on blood thinners as well as atenolol which is her beta blocker. She will followup with cardiology after she's had a sleep study as there is a concern for a struck his sleep apnea contributing to her hypertension fatigue and her atrial fibrillation

## 2013-10-04 NOTE — Progress Notes (Signed)
Patient ID: Monique Gomez, female   DOB: [birth year], 58 y.o.   MRN: [id number]   Subjective:    Patient ID: Monique Gomez, female    DOB: Aug 13, 1955, 58 y.o.   MRN: [id number]  Patient presents for F/U  patient here to followup recent hospitalization injury. She was seen about a month ago secondary to hospitalization for a total fibrillation with RVR as well as non-ST elevated myocardial infarction. She also sustained a lumbar back strain after a fall. She continues to have a lot of fatigue as well as low back pain. She's not had any chest pain or shortness of breath but is due to have a sleep apnea study set up by cardiology next week and will followup with cardiology in 2 weeks. She's trying to get her strength up and trying to walk some on a regular basis. She's taking her medications as prescribed. She does not feel that she is ready to return to her very fast-paced job.    Review Of Systems:  GEN-+ fatigue, fever, weight loss,weakness, recent illness HEENT- denies eye drainage, change in vision, nasal discharge, CVS- denies chest pain, palpitations RESP- denies SOB, cough, wheeze ABD- denies N/V, change in stools, abd pain GU- denies dysuria, hematuria, dribbling, incontinence MSK- +joint pain, muscle aches, injury Neuro- denies headache, dizziness, syncope, seizure activity       Objective:    BP 144/80  Pulse 76  Temp(Src) 98.6 F (37 C) (Oral)  Resp 16  Ht 5' 2.5" (1.588 m)  Wt 306 lb (138.801 kg)  BMI 55.04 kg/m2 GEN- NAD, alert and oriented x3 HEENT- PERRL, EOMI, non injected sclera, pink conjunctiva, MMM, oropharynx clear Neck- Supple, no LAD, good ROM CVS- RRR, no murmur RESP-CTAB EXT- No edema Pulses- Radial, DP- 2+ MSK-  TTP lumbar spine, neg SLR, improved  ROM spine, no paraspinal spasm or tenderness       Assessment & Plan:      Problem List Items Addressed This Visit   None      Note: This dictation was prepared with Dragon dictation along  with smaller phrase technology. Any transcriptional errors that result from this process are unintentional.

## 2013-10-09 ENCOUNTER — Ambulatory Visit: Payer: BC Managed Care – PPO | Attending: Adult Health | Admitting: Sleep Medicine

## 2013-10-09 VITALS — Ht 62.0 in | Wt 306.0 lb

## 2013-10-09 DIAGNOSIS — Z7982 Long term (current) use of aspirin: Secondary | ICD-10-CM | POA: Insufficient documentation

## 2013-10-09 DIAGNOSIS — G4733 Obstructive sleep apnea (adult) (pediatric): Secondary | ICD-10-CM | POA: Insufficient documentation

## 2013-10-09 DIAGNOSIS — G4761 Periodic limb movement disorder: Secondary | ICD-10-CM | POA: Insufficient documentation

## 2013-10-11 NOTE — Progress Notes (Signed)
HPI; Monique Gomez is a 58 year old patient of Dr. Domenic Polite we are following for ongoing assessment and management of atrial fibrillation, with recent hospitalization in May of 2015 in the setting of non-ST elevation MI with atrial fibrillation. Patient had a cardiac catheterization which revealed normal coronary anatomy without significant atherosclerosis with LVEF of 60% . She was continued on aspirin beta blocker and statin. She was followed by primary care for ongoing treatment of diabetes.  Unless office visit on 09/12/2013 the patient is tired but remained active. She did not return to work but was due to return in June of 2015. She had no recurrence of rapid heart rhythm. She was referred for a sleep study. She was to be completed on 10/09/2013. This revealed severe obstructive sleep apnea syndrome which responded to CPAP of 8. Revealed moderately severe periodic limb movement disorder. Abnormal architecture with early REM latency which can be due to narcolepsy but more commonly REM rebound phenomenon due to withdrawal from antidepressants and stimulants.  The patient states that she felt wonderful after complete a sleep study as she was placed on CPAP in the middle of it due to her frequent apneic events. She states the following morning she walked a half a mile with her daughter she did not wake up with a headache and had more energy.  Concerning her atrial fibrillation she occasionally feels some fluttering but nondistended. She is anxious to be seen by pulmonology in order to institute CPAP.    No Known Allergies  Current Outpatient Prescriptions  Medication Sig Dispense Refill  . acetaminophen (TYLENOL) 500 MG tablet Take 1,000 mg by mouth every 6 (six) hours as needed for moderate pain.       Marland Kitchen apixaban (ELIQUIS) 5 MG TABS tablet Take 1 tablet (5 mg total) by mouth 2 (two) times daily.  60 tablet  11  . aspirin EC 81 MG EC tablet Take 1 tablet (81 mg total) by mouth daily.      Marland Kitchen  atenolol (TENORMIN) 25 MG tablet TAKE ONE TABLET BY MOUTH ONCE DAILY  90 tablet  1  . HYDROcodone-acetaminophen (NORCO) 5-325 MG per tablet Take 1 tablet by mouth every 6 (six) hours as needed for moderate pain.  45 tablet  0  . lisinopril (PRINIVIL,ZESTRIL) 10 MG tablet TAKE ONE TABLET BY MOUTH ONCE DAILY  90 tablet  1  . metFORMIN (GLUCOPHAGE-XR) 500 MG 24 hr tablet Take 1 tablet (500 mg total) by mouth every evening.  90 tablet  1  . pantoprazole (PROTONIX) 40 MG tablet TAKE ONE TABLET BY MOUTH ONCE DAILY  90 tablet  1  . simvastatin (ZOCOR) 40 MG tablet        No current facility-administered medications for this visit.    Past Medical History  Diagnosis Date  . Allergic rhinitis   . Obesity   . Hyperlipidemia   . Essential hypertension, benign   . History of migraine headaches   . Type 2 diabetes mellitus   . Anemia   . PAF (paroxysmal atrial fibrillation) 08/2013  . GERD (gastroesophageal reflux disease)   . [id number](546.2)     Past Surgical History  Procedure Laterality Date  . Right carpal tunnel release      ROS Review of systems complete and found to be negative unless listed above   PHYSICAL EXAM There were no vitals taken for this visit. General: Well developed, well nourished, in no acute distress, morbidly obese  Head: Eyes PERRLA, No xanthomas.  Normal cephalic and atramatic  Lungs: Clear bilaterally to auscultation and percussion. Heart: HRRR S1 S2, without MRG.  Pulses are 2+ & equal.            No carotid bruit. No JVD.  No abdominal bruits. No femoral bruits. Abdomen: Bowel sounds are positive, abdomen soft and non-tender without masses or                  Hernia's noted. Msk:  Back normal, normal gait. Normal strength and tone for age. Extremities: No clubbing, cyanosis or edema.  DP +1 Neuro: Alert and oriented X 3. Psych:  Good affect, responds appropriately    ASSESSMENT AND PLAN

## 2013-10-14 ENCOUNTER — Encounter: Payer: Self-pay | Admitting: Adult Health

## 2013-10-14 ENCOUNTER — Ambulatory Visit (INDEPENDENT_AMBULATORY_CARE_PROVIDER_SITE_OTHER): Payer: BC Managed Care – PPO | Admitting: Adult Health

## 2013-10-14 VITALS — BP 128/80 | HR 85 | Ht 61.0 in | Wt 308.0 lb

## 2013-10-14 DIAGNOSIS — I10 Essential (primary) hypertension: Secondary | ICD-10-CM

## 2013-10-14 DIAGNOSIS — I4891 Unspecified atrial fibrillation: Secondary | ICD-10-CM

## 2013-10-14 DIAGNOSIS — G4733 Obstructive sleep apnea (adult) (pediatric): Secondary | ICD-10-CM

## 2013-10-14 NOTE — Assessment & Plan Note (Signed)
Patient will be referred this week to pulmonology, Dr. Luan Pulling, or institution of CPAP. The patient states wearing the CPAP allow her to awaken without headache and she felt much better, and had more energy. She is anxious to be seen by pulmonology and have CPAP instituted.

## 2013-10-14 NOTE — Sleep Study (Signed)
  [place] A. Merlene Laughter, MD     [email or web address]        NOCTURNAL POLYSOMNOGRAM    LOCATION: SLEEP LAB FACILITY: [facility]   PHYSICIAN: Veronique Warga A. Merlene Laughter, M.D.   DATE OF STUDY: 10/09/2013.   REFERRING PHYSICIAN: Jory Sims, NP.  INDICATIONS: This is a 58 year old who presents with last snoring, insomnia and headaches.  MEDICATIONS:  Prior to Admission medications   Medication Sig Start Date End Date Taking? Authorizing Provider  acetaminophen (TYLENOL) 500 MG tablet Take 1,000 mg by mouth every 6 (six) hours as needed for moderate pain.     Historical Provider, MD  apixaban (ELIQUIS) 5 MG TABS tablet Take 1 tablet (5 mg total) by mouth 2 (two) times daily. 09/04/13   Perry Mount, PA-C  aspirin EC 81 MG EC tablet Take 1 tablet (81 mg total) by mouth daily. 09/04/13   Perry Mount, PA-C  atenolol (TENORMIN) 25 MG tablet TAKE ONE TABLET BY MOUTH ONCE DAILY 09/06/13   Alycia Rossetti, MD  HYDROcodone-acetaminophen [facility]) 5-325 MG per tablet Take 1 tablet by mouth every 6 (six) hours as needed for moderate pain. 09/06/13   Alycia Rossetti, MD  lisinopril (PRINIVIL,ZESTRIL) 10 MG tablet TAKE ONE TABLET BY MOUTH ONCE DAILY 09/06/13   Alycia Rossetti, MD  metFORMIN (GLUCOPHAGE-XR) 500 MG 24 hr tablet Take 1 tablet (500 mg total) by mouth every evening. 09/06/13   Alycia Rossetti, MD  pantoprazole (PROTONIX) 40 MG tablet TAKE ONE TABLET BY MOUTH ONCE DAILY 09/06/13   Alycia Rossetti, MD  simvastatin (ZOCOR) 40 MG tablet  07/15/13   Historical Provider, MD      EPWORTH SLEEPINESS SCALE: 15.   BMI: 56.   ARCHITECTURAL SUMMARY: This is a split-night study with initial portion be a diagnostic and the second portion the titration recording.Total recording time was 370 minutes. Sleep efficiency 68 %. Sleep latency 24 minutes. REM latency 35 minutes. Stage NI 8 %, N2 62 % and N3 2 % and REM sleep 28 %.    RESPIRATORY DATA:  Baseline oxygen saturation is 96 %. The lowest  saturation is 64 %. The diagnostic AHI is 69. The patient was placed in positive pressure starting at 4 and titrated to 9. The optimal pressure is 8 with resolution of obstructive events and good tolerance.  LIMB MOVEMENT SUMMARY: PLM index 30.   ELECTROCARDIOGRAM SUMMARY: Average heart rate is 62 with no significant dysrhythmias observed.   IMPRESSION:  1. Severe obstructive sleep apnea syndrome which responds well to CPAP of 8. 2. Moderately severe periodic limb movement disorder. 3. Abnormal architecture with early REM latency which can be due to narcolepsy but more commonly REM rebound phenomenon due to withdrawal from antidepressants and stimulants.  Thanks for this referral.  Valente Fosberg A. Merlene Laughter, M.D. Diplomat, [employer] adviser of [employer] Medicine.

## 2013-10-14 NOTE — Assessment & Plan Note (Signed)
Blood pressure is currently well-controlled. Once institution of CPAP is made, we will need to follow this closely as her blood pressure will probably decrease and therefore medication regimen will have to be adjusted.

## 2013-10-14 NOTE — Assessment & Plan Note (Signed)
Heart rate is currently well-controlled. She is tolerating Eliquis without evidence of bleeding. Will continue her on her current medication regimen without any changes. She will followup with [place] in 6 months unless symptomatic.

## 2013-10-14 NOTE — Progress Notes (Deleted)
Name: Monique Gomez    DOB: March 12, 1956  Age: 58 y.o.  MR#: [id number]       PCP:  Vic Blackbird, MD      Insurance: Payor: [place] / Plan: [employer] [place] P[place] / Product Type: *No Product type* /   CC:    Chief Complaint  Patient presents with  . Atrial Fibrillation    VS Filed Vitals:   10/14/13 1355  BP: 128/80  Pulse: 85  Height: 5\' 1"  (1.549 m)  Weight: 308 lb (139.708 kg)  SpO2: 96%    Weights Current Weight  10/14/13 308 lb (139.708 kg)  10/09/13 306 lb (138.801 kg)  10/04/13 306 lb (138.801 kg)    Blood Pressure  BP Readings from Last 3 Encounters:  10/14/13 128/80  10/04/13 144/80  09/12/13 110/70     Admit date:  (Not on file) Last encounter with RMR:  09/12/2013   Allergy Review of patient's allergies indicates no known allergies.  Current Outpatient Prescriptions  Medication Sig Dispense Refill  . acetaminophen (TYLENOL) 500 MG tablet Take 1,000 mg by mouth every 6 (six) hours as needed for moderate pain.       Marland Kitchen apixaban (ELIQUIS) 5 MG TABS tablet Take 1 tablet (5 mg total) by mouth 2 (two) times daily.  60 tablet  11  . aspirin EC 81 MG EC tablet Take 1 tablet (81 mg total) by mouth daily.      Marland Kitchen atenolol (TENORMIN) 25 MG tablet TAKE ONE TABLET BY MOUTH ONCE DAILY  90 tablet  1  . HYDROcodone-acetaminophen (NORCO) 5-325 MG per tablet Take 1 tablet by mouth every 6 (six) hours as needed for moderate pain.  45 tablet  0  . lisinopril (PRINIVIL,ZESTRIL) 10 MG tablet TAKE ONE TABLET BY MOUTH ONCE DAILY  90 tablet  1  . metFORMIN (GLUCOPHAGE-XR) 500 MG 24 hr tablet Take 1 tablet (500 mg total) by mouth every evening.  90 tablet  1  . pantoprazole (PROTONIX) 40 MG tablet TAKE ONE TABLET BY MOUTH ONCE DAILY  90 tablet  1  . simvastatin (ZOCOR) 40 MG tablet        No current facility-administered medications for this visit.    Discontinued Meds:   There are no discontinued medications.  Patient Active Problem List   Diagnosis Date Noted  .  Atrial fibrillation 09/06/2013  . Lumbar back pain 09/06/2013  . NSTEMI (non-ST elevated myocardial infarction) 09/03/2013  . MDD (major depressive disorder), single episode 02/04/2013  . Anxiety state, unspecified 02/04/2013  . Diabetes mellitus 06/06/2012  . Carpal tunnel syndrome of left wrist 12/27/2011  . De Quervain's disease (tenosynovitis) 10/09/2011  . Heartburn 02/27/2011  . Allergic rhinitis 08/29/2010  . ANEMIA 02/15/2010  . HYPERLIPIDEMIA 12/23/2009  . OBESITY 12/23/2009  . MIGRAINE HEADACHE 12/23/2009  . Essential hypertension, benign 12/23/2009    LABS    Component Value Date/Time   NA 142 09/03/2013 0445   NA 141 09/02/2013 0943   NA 141 02/04/2013 1128   K 4.0 09/03/2013 0445   K 4.2 09/02/2013 0943   K 4.1 02/04/2013 1128   CL 104 09/03/2013 0445   CL 103 09/02/2013 0943   CL 102 02/04/2013 1128   CO2 28 09/03/2013 0445   CO2 26 09/02/2013 0943   CO2 31 02/04/2013 1128   GLUCOSE 122* 09/03/2013 0445   GLUCOSE 146* 09/02/2013 0943   GLUCOSE 102* 02/04/2013 1128   BUN 15 09/03/2013 0445   BUN 16 09/02/2013  0943   BUN 13 02/04/2013 1128   CREATININE 0.71 09/03/2013 0445   CREATININE 0.61 09/02/2013 0943   CREATININE 0.71 02/04/2013 1128   CREATININE 0.78 06/04/2012 1008   CREATININE 0.70 08/27/2010 0913   CALCIUM 9.1 09/03/2013 0445   CALCIUM 9.1 09/02/2013 0943   CALCIUM 9.6 02/04/2013 1128   GFRNONAA >90 09/03/2013 0445   GFRNONAA >90 09/02/2013 0943   GFRNONAA >90 06/04/2012 1008   GFRAA >90 09/03/2013 0445   GFRAA >90 09/02/2013 0943   GFRAA >90 06/04/2012 1008   CMP     Component Value Date/Time   NA 142 09/03/2013 0445   K 4.0 09/03/2013 0445   CL 104 09/03/2013 0445   CO2 28 09/03/2013 0445   GLUCOSE 122* 09/03/2013 0445   BUN 15 09/03/2013 0445   CREATININE 0.71 09/03/2013 0445   CREATININE 0.71 02/04/2013 1128   CALCIUM 9.1 09/03/2013 0445   PROT 7.5 09/02/2013 0943   ALBUMIN 3.2* 09/02/2013 0943   AST 12 09/02/2013 0943   ALT 8 09/02/2013 0943   ALKPHOS 71 09/02/2013 0943   BILITOT <0.2* 09/02/2013 0943    GFRNONAA >90 09/03/2013 0445   GFRAA >90 09/03/2013 0445       Component Value Date/Time   WBC 10.4 09/03/2013 0445   WBC 9.5 09/02/2013 0943   WBC 9.0 02/04/2013 1128   HGB 11.1* 09/03/2013 0445   HGB 12.2 09/02/2013 0943   HGB 12.2 02/04/2013 1128   HCT 35.6* 09/03/2013 0445   HCT 38.5 09/02/2013 0943   HCT 39.0 02/04/2013 1128   MCV 65.8* 09/03/2013 0445   MCV 65.4* 09/02/2013 0943   MCV 64.7* 02/04/2013 1128    Lipid Panel     Component Value Date/Time   CHOL 176 09/06/2013 1153   TRIG 71 09/06/2013 1153   HDL 36* 09/06/2013 1153   CHOLHDL 4.9 09/06/2013 1153   VLDL 14 09/06/2013 1153   LDLCALC 126* 09/06/2013 1153    ABG No results found for this basename: phart, pco2, pco2art, po2, po2art, hco3, tco2, acidbasedef, o2sat     Lab Results  Component Value Date   TSH 2.320 09/02/2013   BNP (last 3 results) No results found for this basename: PROBNP,  in the last 8760 hours Cardiac Panel (last 3 results) No results found for this basename: CKTOTAL, CKMB, TROPONINI, RELINDX,  in the last 72 hours  Iron/TIBC/Ferritin    Component Value Date/Time   IRON 31* 08/27/2010 0913   TIBC 371 08/27/2010 0913   FERRITIN 68 08/27/2010 0913     EKG Orders placed during the hospital encounter of 09/02/13  . EKG 12-LEAD  . EKG 12-LEAD  . EKG 12-LEAD  . EKG 12-LEAD  . EKG 12-LEAD  . EKG 12-LEAD  . EKG 12-LEAD  . EKG 12-LEAD  . EKG     Prior Assessment and Plan Problem List as of 10/14/2013     Cardiovascular and Mediastinum   MIGRAINE HEADACHE   Essential hypertension, benign   Last Assessment & Plan   10/04/2013 Office Visit Written 10/04/2013  4:51 PM by Alycia Rossetti, MD     Blood pressure is well-controlled    NSTEMI (non-ST elevated myocardial infarction)   Last Assessment & Plan   09/12/2013 Office Visit Written 09/12/2013  4:05 PM by Lendon Colonel, NP     She is continued asymptomatic without recurrence of discomfort. Her main complaint is overall fatigue and chronic lower back pain. She  will continue on aspirin and beta blocker  along with statin therapy. We will see her again in one month unless she is symptomatic.    Atrial fibrillation   Last Assessment & Plan   10/04/2013 Office Visit Written 10/04/2013  4:51 PM by Alycia Rossetti, MD     She is in sinus rhythm and is been doing well on her medications. She is on blood thinners as well as atenolol which is her beta blocker. She will followup with cardiology after she's had a sleep study as there is a concern for a struck his sleep apnea contributing to her hypertension fatigue and her atrial fibrillation      Respiratory   Allergic rhinitis   Last Assessment & Plan   08/27/2010 Office Visit Written 08/29/2010 10:18 AM by Tula Nakayama, MD     Deteriorate, injections administered in office and med prescribed      Endocrine   Diabetes mellitus   Last Assessment & Plan   09/06/2013 Office Visit Written 09/06/2013  1:58 PM by Alycia Rossetti, MD     Recheck A1c she is on metformin 500 mg once a day as well as ACE inhibitor and statin drug      Nervous and Auditory   Carpal tunnel syndrome of left wrist     Musculoskeletal and Integument   De Quervain's disease (tenosynovitis)   Last Assessment & Plan   06/05/2012 Office Visit Written 06/06/2012  9:40 PM by Alycia Rossetti, MD     She has had recurrent symptoms, advised restart NSAIDS, call ortho if needed      Other   [place]   09/06/2013 Office Visit Written 09/06/2013  1:57 PM by Alycia Rossetti, MD     Check fasting lipid panel she's currently on Zocor goal is to keep LDL below 100    OBESITY   Last Assessment & Plan   09/12/2013 Office Visit Written 09/12/2013  4:09 PM by Lendon Colonel, NP     She is adhering to a heart healthy diet and walking daily. She has lost 10 pounds. I have congratulated her on this lifestyle change, and encouraged her to continue this.    ANEMIA   Last Assessment & Plan   06/05/2012 Office Visit Written  06/06/2012  9:39 PM by Alycia Rossetti, MD     Very mild, HB stable    Heartburn   Last Assessment & Plan   06/05/2012 Office Visit Written 06/06/2012  9:41 PM by Alycia Rossetti, MD     Discussed foods to avoid, PPI increased for next 2 weeks, then return to once a day    MDD (major depressive disorder), single episode   Last Assessment & Plan   02/04/2013 Office Visit Written 02/04/2013 10:12 PM by Alycia Rossetti, MD     Prolonged grief, start effexor daily, therapy established ,contracted for saftey    Anxiety state, unspecified   Last Assessment & Plan   02/04/2013 Office Visit Edited 02/04/2013 10:13 PM by Alycia Rossetti, MD     Very overwhelmed with little support Start meds Ativan at bedtime for sleep- will use short term until SNRI established    Lumbar back pain   Last Assessment & Plan   10/04/2013 Office Visit Written 10/04/2013  4:51 PM by Alycia Rossetti, MD     Her back pain is slowly improving. Her x-rays were negative. She does have pain medication to use as needed. We discussed strengthening of her lower  back as well as weight loss to also help with recovery.        Imaging: No results found.

## 2013-10-14 NOTE — Patient Instructions (Signed)
Your physician recommends that you schedule a follow-up appointment in: 6 months with Dr Domenic Polite.  .You have been referred to Dr. Luan Pulling

## 2013-11-05 ENCOUNTER — Other Ambulatory Visit: Payer: Self-pay

## 2013-11-05 DIAGNOSIS — G4733 Obstructive sleep apnea (adult) (pediatric): Secondary | ICD-10-CM

## 2013-11-05 NOTE — Progress Notes (Unsigned)
Referral placed to Dr.Hawkins for CPAP titration and management  Copy of sleep study mailed to pt

## 2013-12-06 ENCOUNTER — Encounter: Payer: Self-pay | Admitting: Family Medicine

## 2013-12-06 ENCOUNTER — Ambulatory Visit (INDEPENDENT_AMBULATORY_CARE_PROVIDER_SITE_OTHER): Payer: BC Managed Care – PPO | Admitting: Family Medicine

## 2013-12-06 VITALS — BP 126/82 | HR 72 | Temp 98.3°F | Resp 14 | Ht 62.0 in | Wt 301.0 lb

## 2013-12-06 DIAGNOSIS — Z1211 Encounter for screening for malignant neoplasm of colon: Secondary | ICD-10-CM

## 2013-12-06 DIAGNOSIS — Z1231 Encounter for screening mammogram for malignant neoplasm of breast: Secondary | ICD-10-CM

## 2013-12-06 DIAGNOSIS — M545 Low back pain, unspecified: Secondary | ICD-10-CM

## 2013-12-06 DIAGNOSIS — E669 Obesity, unspecified: Secondary | ICD-10-CM

## 2013-12-06 DIAGNOSIS — E119 Type 2 diabetes mellitus without complications: Secondary | ICD-10-CM

## 2013-12-06 DIAGNOSIS — Z23 Encounter for immunization: Secondary | ICD-10-CM

## 2013-12-06 DIAGNOSIS — E785 Hyperlipidemia, unspecified: Secondary | ICD-10-CM

## 2013-12-06 DIAGNOSIS — I10 Essential (primary) hypertension: Secondary | ICD-10-CM

## 2013-12-06 LAB — CBC
HCT: 35.6 % — ABNORMAL LOW (ref 36.0–46.0)
HEMOGLOBIN: 10.8 g/dL — AB (ref 12.0–15.0)
MCH: 19.9 pg — AB (ref 26.0–34.0)
MCHC: 30.3 g/dL (ref 30.0–36.0)
MCV: 65.4 fL — ABNORMAL LOW (ref 78.0–100.0)
PLATELETS: 315 10*3/uL (ref 150–400)
RBC: 5.44 MIL/uL — ABNORMAL HIGH (ref 3.87–5.11)
RDW: 18.5 % — ABNORMAL HIGH (ref 11.5–15.5)
WBC: 11.5 10*3/uL — AB (ref 4.0–10.5)

## 2013-12-06 MED ORDER — HYDROCODONE-ACETAMINOPHEN 5-325 MG PO TABS
1.0000 | ORAL_TABLET | Freq: Four times a day (QID) | ORAL | Status: DC | PRN
Start: 2013-12-06 — End: 2014-03-07

## 2013-12-06 NOTE — Progress Notes (Signed)
Patient ID: Monique Gomez, female   DOB: August 04, 1955, 58 y.o.   MRN: [id number]   Subjective:    Patient ID: Monique Gomez, female    DOB: [birth year], 58 y.o.   MRN: [id number]  Patient presents for 2 month F/U  Patient here to follow chronic medical problems. She has no new concerns today. She continues to have low back pain which is nonradiating. She still taking pain medication as needed. She did have x-rays back in April which does not show any acute abnormality.  A. fib she's been doing well she's been rate controlled she is taking her blood thinner as well as her rate: Medication without difficulty her blood pressure has been well-controlled. She also has sleep apnea is currently on a CPAP. She will like to proceed with her colonoscopy   Review Of Systems:  GEN- denies fatigue, fever, weight loss,weakness, recent illness HEENT- denies eye drainage, change in vision, nasal discharge, CVS- denies chest pain, palpitations RESP- denies SOB, cough, wheeze ABD- denies N/V, change in stools, abd pain GU- denies dysuria, hematuria, dribbling, incontinence MSK- + joint pain, muscle aches, injury Neuro- denies headache, dizziness, syncope, seizure activity       Objective:    BP 126/82  Pulse 72  Temp(Src) 98.3 F (36.8 C) (Oral)  Resp 14  Ht 5\' 2"  (1.575 m)  Wt 301 lb (136.533 kg)  BMI 55.04 kg/m2 GEN- NAD, alert and oriented x3 HEENT- PERRL, EOMI, non injected sclera, pink conjunctiva, MMM, oropharynx clear CVS- RRR, no murmur RESP-CTAB MSK-  TTP lumbar spine, neg SLR, fair  ROM spine, no paraspinal spasm or tenderness EXT- No edema Pulses- Radial, DP- 2+    Assessment & Plan:      Problem List Items Addressed This Visit   OBESITY   Lumbar back pain   Relevant Medications      HYDROcodone-acetaminophen (NORCO) 5-325 MG per tablet   HYPERLIPIDEMIA - Primary   Relevant Orders      Lipid panel   Essential hypertension, benign   Relevant Orders   Comprehensive metabolic panel      CBC   Diabetes mellitus   Relevant Orders      Hemoglobin A1c    Other Visit Diagnoses   Colon cancer screening        Relevant Orders       Ambulatory referral to Gastroenterology    Other screening mammogram        Relevant Orders       MM DIGITAL SCREENING BILATERAL       Note: This dictation was prepared with Dragon dictation along with smaller phrase technology. Any transcriptional errors that result from this process are unintentional.

## 2013-12-06 NOTE — Assessment & Plan Note (Signed)
Weight loss noted  

## 2013-12-06 NOTE — Assessment & Plan Note (Signed)
A recheck her A1c goal is less than 7%

## 2013-12-06 NOTE — Assessment & Plan Note (Signed)
Blood pressure is well-controlled at change in medications

## 2013-12-06 NOTE — Patient Instructions (Addendum)
Continue current medications We will call with lab results Pneumonia vaccine F/U 3 months

## 2013-12-06 NOTE — Assessment & Plan Note (Signed)
She continues to have low back pain however has some sciatica at this time. Based the duration of her symptoms and she had a fall and MRI would be indicated however she wants to hold off at this time. I have refilled her pain medications for the meantime

## 2013-12-07 LAB — COMPREHENSIVE METABOLIC PANEL
ALT: 23 U/L (ref 0–35)
AST: 23 U/L (ref 0–37)
Albumin: 4.8 g/dL (ref 3.5–5.2)
Alkaline Phosphatase: 68 U/L (ref 39–117)
BUN: 12 mg/dL (ref 6–23)
CO2: 28 mEq/L (ref 19–32)
Calcium: 9.7 mg/dL (ref 8.4–10.5)
Chloride: 102 mEq/L (ref 96–112)
Creat: 0.94 mg/dL (ref 0.50–1.10)
Glucose, Bld: 79 mg/dL (ref 70–99)
Potassium: 4.4 mEq/L (ref 3.5–5.3)
Sodium: 139 mEq/L (ref 135–145)
Total Bilirubin: 1.1 mg/dL (ref 0.2–1.2)
Total Protein: 7.4 g/dL (ref 6.0–8.3)

## 2013-12-07 LAB — LIPID PANEL
CHOLESTEROL: 136 mg/dL (ref 0–200)
HDL: 46 mg/dL (ref >39)
LDL Cholesterol: 78 mg/dL (ref 0–99)
Total CHOL/HDL Ratio: 3 Ratio
Triglycerides: 62 mg/dL (ref <150)
VLDL: 12 mg/dL (ref 0–40)

## 2013-12-07 LAB — HEMOGLOBIN A1C
Hgb A1c MFr Bld: 6.5 % — ABNORMAL HIGH (ref <5.7)
MEAN PLASMA GLUCOSE: 140 mg/dL — AB (ref <117)

## 2013-12-10 ENCOUNTER — Encounter (INDEPENDENT_AMBULATORY_CARE_PROVIDER_SITE_OTHER): Payer: Self-pay | Admitting: *Deleted

## 2013-12-12 ENCOUNTER — Other Ambulatory Visit: Payer: Self-pay | Admitting: *Deleted

## 2013-12-12 DIAGNOSIS — Z1211 Encounter for screening for malignant neoplasm of colon: Secondary | ICD-10-CM

## 2013-12-12 DIAGNOSIS — D509 Iron deficiency anemia, unspecified: Secondary | ICD-10-CM

## 2013-12-12 MED ORDER — FERROUS SULFATE 325 (65 FE) MG PO TABS: 325.0000 mg | ORAL_TABLET | Freq: Two times a day (BID) | ORAL | Status: DC

## 2013-12-16 ENCOUNTER — Encounter (INDEPENDENT_AMBULATORY_CARE_PROVIDER_SITE_OTHER): Payer: Self-pay | Admitting: *Deleted

## 2013-12-26 ENCOUNTER — Encounter: Payer: Self-pay | Admitting: *Deleted

## 2014-01-16 ENCOUNTER — Ambulatory Visit (INDEPENDENT_AMBULATORY_CARE_PROVIDER_SITE_OTHER): Payer: BC Managed Care – PPO | Admitting: Internal Medicine

## 2014-01-23 ENCOUNTER — Encounter (INDEPENDENT_AMBULATORY_CARE_PROVIDER_SITE_OTHER): Payer: Self-pay | Admitting: *Deleted

## 2014-01-23 ENCOUNTER — Telehealth (INDEPENDENT_AMBULATORY_CARE_PROVIDER_SITE_OTHER): Payer: Self-pay | Admitting: *Deleted

## 2014-01-23 NOTE — Telephone Encounter (Signed)
Analis NO SHOWED for her apt with Deberah Castle, NP on 01/16/14. A NS letter has been mailed.

## 2014-03-07 ENCOUNTER — Encounter: Payer: Self-pay | Admitting: Family Medicine

## 2014-03-07 ENCOUNTER — Ambulatory Visit (INDEPENDENT_AMBULATORY_CARE_PROVIDER_SITE_OTHER): Payer: BC Managed Care – PPO | Admitting: Family Medicine

## 2014-03-07 VITALS — BP 130/70 | HR 88 | Temp 98.6°F | Resp 14 | Ht 62.0 in | Wt 307.0 lb

## 2014-03-07 DIAGNOSIS — E669 Obesity, unspecified: Secondary | ICD-10-CM

## 2014-03-07 DIAGNOSIS — K219 Gastro-esophageal reflux disease without esophagitis: Secondary | ICD-10-CM

## 2014-03-07 DIAGNOSIS — E785 Hyperlipidemia, unspecified: Secondary | ICD-10-CM

## 2014-03-07 DIAGNOSIS — E119 Type 2 diabetes mellitus without complications: Secondary | ICD-10-CM

## 2014-03-07 DIAGNOSIS — I10 Essential (primary) hypertension: Secondary | ICD-10-CM

## 2014-03-07 DIAGNOSIS — G4733 Obstructive sleep apnea (adult) (pediatric): Secondary | ICD-10-CM

## 2014-03-07 LAB — CBC WITH DIFFERENTIAL/PLATELET
Basophils Absolute: 0 10*3/uL (ref 0.0–0.1)
Basophils Relative: 0 % (ref 0–1)
EOS ABS: 0.2 10*3/uL (ref 0.0–0.7)
Eosinophils Relative: 2 % (ref 0–5)
HEMATOCRIT: 38.5 % (ref 36.0–46.0)
HEMOGLOBIN: 11.6 g/dL — AB (ref 12.0–15.0)
Lymphocytes Relative: 27 % (ref 12–46)
Lymphs Abs: 2.2 10*3/uL (ref 0.7–4.0)
MCH: 19.8 pg — ABNORMAL LOW (ref 26.0–34.0)
MCHC: 30.1 g/dL (ref 30.0–36.0)
MCV: 65.8 fL — AB (ref 78.0–100.0)
MONO ABS: 0.5 10*3/uL (ref 0.1–1.0)
MONOS PCT: 6 % (ref 3–12)
NEUTROS ABS: 5.3 10*3/uL (ref 1.7–7.7)
Neutrophils Relative %: 65 % (ref 43–77)
Platelets: 333 10*3/uL (ref 150–400)
RBC: 5.85 MIL/uL — ABNORMAL HIGH (ref 3.87–5.11)
RDW: 18.5 % — ABNORMAL HIGH (ref 11.5–15.5)
WBC: 8.2 10*3/uL (ref 4.0–10.5)

## 2014-03-07 MED ORDER — SIMVASTATIN 40 MG PO TABS: 40.0000 mg | ORAL_TABLET | Freq: Every day | ORAL | Status: DC

## 2014-03-07 MED ORDER — ATENOLOL 25 MG PO TABS: ORAL_TABLET | ORAL | Status: DC

## 2014-03-07 MED ORDER — LISINOPRIL 10 MG PO TABS: ORAL_TABLET | ORAL | Status: DC

## 2014-03-07 MED ORDER — PANTOPRAZOLE SODIUM 40 MG PO TBEC: DELAYED_RELEASE_TABLET | ORAL | Status: DC

## 2014-03-07 MED ORDER — APIXABAN 5 MG PO TABS: 5.0000 mg | ORAL_TABLET | Freq: Two times a day (BID) | ORAL | Status: DC

## 2014-03-07 MED ORDER — METFORMIN HCL ER 500 MG PO TB24: 500.0000 mg | ORAL_TABLET | Freq: Every evening | ORAL | Status: DC

## 2014-03-07 MED ORDER — HYDROCODONE-ACETAMINOPHEN 5-325 MG PO TABS: 1.0000 | ORAL_TABLET | Freq: Four times a day (QID) | ORAL | Status: DC | PRN

## 2014-03-07 NOTE — Assessment & Plan Note (Signed)
Blood pressure is well controlled and changed her medication 

## 2014-03-07 NOTE — Assessment & Plan Note (Signed)
Discussed need for weight loss. 

## 2014-03-07 NOTE — Patient Instructions (Signed)
Continue current medications We will call with lab results  F/U 3 months  

## 2014-03-07 NOTE — Assessment & Plan Note (Signed)
Will recheck A1c, continue metformin work on dietary changes and weight loss

## 2014-03-07 NOTE — Progress Notes (Signed)
Patient ID: Monique Gomez, female   DOB: July 07, 1955, 58 y.o.   MRN: [id number]   Subjective:    Patient ID: Monique Gomez, female    DOB: 01-Jan-1956, 58 y.o.   MRN: [id number]  Patient presents for 3 month F/U patient here to follow chronic medical problems. She has no specific concerns today state that she is doing well. She is working out with her daughter and is changed her diet. She is taken off her medications as prescribed. Regarding atrial fibrillation she had one episode of fluttering but was very short lived. She is also using C Pap for obstructive sleep apnea.  She is rescheduling her colonoscopy   Review Of Systems:  GEN- denies fatigue, fever, weight loss,weakness, recent illness HEENT- denies eye drainage, change in vision, nasal discharge, CVS- denies chest pain, palpitations RESP- denies SOB, cough, wheeze ABD- denies N/V, change in stools, abd pain GU- denies dysuria, hematuria, dribbling, incontinence MSK- denies joint pain, muscle aches, injury Neuro- denies headache, dizziness, syncope, seizure activity       Objective:    BP 130/70 mmHg  Pulse 88  Temp(Src) 98.6 F (37 C) (Oral)  Resp 14  Ht 5\' 2"  (1.575 m)  Wt 307 lb (139.254 kg)  BMI 56.14 kg/m2 GEN- NAD, alert and oriented x3 HEENT- PERRL, EOMI, non injected sclera, pink conjunctiva, MMM, oropharynx clear Neck- Supple, no thyromegaly CVS- RRR no murmur RESP-CTAB EXT- No edema Pulses- Radial, DP- 2+        Assessment & Plan:      Problem List Items Addressed This Visit    Obesity - Primary   Hyperlipidemia   Relevant Orders      Lipid panel   Essential hypertension, benign   Relevant Orders      CBC with Differential      Comprehensive metabolic panel   Diabetes mellitus   Relevant Orders      CBC with Differential      Comprehensive metabolic panel      Hemoglobin A1c      Microalbumin / creatinine urine ratio      Note: This dictation was prepared with Dragon dictation  along with smaller phrase technology. Any transcriptional errors that result from this process are unintentional.

## 2014-03-08 LAB — COMPREHENSIVE METABOLIC PANEL
ALBUMIN: 3.8 g/dL (ref 3.5–5.2)
ALK PHOS: 65 U/L (ref 39–117)
ALT: 9 U/L (ref 0–35)
AST: 13 U/L (ref 0–37)
BUN: 18 mg/dL (ref 6–23)
CO2: 26 mEq/L (ref 19–32)
Calcium: 9.6 mg/dL (ref 8.4–10.5)
Chloride: 101 mEq/L (ref 96–112)
Creat: 0.8 mg/dL (ref 0.50–1.10)
GLUCOSE: 101 mg/dL — AB (ref 70–99)
Potassium: 4.4 mEq/L (ref 3.5–5.3)
Sodium: 139 mEq/L (ref 135–145)
Total Bilirubin: 0.2 mg/dL (ref 0.2–1.2)
Total Protein: 7.3 g/dL (ref 6.0–8.3)

## 2014-03-08 LAB — HEMOGLOBIN A1C
Hgb A1c MFr Bld: 6.6 % — ABNORMAL HIGH (ref <5.7)
Mean Plasma Glucose: 143 mg/dL — ABNORMAL HIGH (ref <117)

## 2014-03-08 LAB — LIPID PANEL
Cholesterol: 140 mg/dL (ref 0–200)
HDL: 37 mg/dL — AB (ref >39)
LDL Cholesterol: 91 mg/dL (ref 0–99)
TRIGLYCERIDES: 61 mg/dL (ref <150)
Total CHOL/HDL Ratio: 3.8 Ratio
VLDL: 12 mg/dL (ref 0–40)

## 2014-03-08 LAB — MICROALBUMIN / CREATININE URINE RATIO
CREATININE, URINE: 379.6 mg/dL
MICROALB/CREAT RATIO: 9 mg/g (ref 0.0–30.0)
Microalb, Ur: 3.4 mg/dL — ABNORMAL HIGH (ref <2.0)

## 2014-04-10 ENCOUNTER — Encounter (HOSPITAL_COMMUNITY): Payer: Self-pay | Admitting: Interventional Cardiology

## 2014-04-30 ENCOUNTER — Encounter: Payer: Self-pay | Admitting: Family Medicine

## 2014-04-30 ENCOUNTER — Ambulatory Visit (HOSPITAL_COMMUNITY)
Admission: RE | Admit: 2014-04-30 | Discharge: 2014-04-30 | Disposition: A | Discharge status: Home / Self Care | Payer: BC Managed Care – PPO | Source: Ambulatory Visit | Attending: Family Medicine | Admitting: Family Medicine

## 2014-04-30 ENCOUNTER — Ambulatory Visit (INDEPENDENT_AMBULATORY_CARE_PROVIDER_SITE_OTHER): Payer: BC Managed Care – PPO | Admitting: Family Medicine

## 2014-04-30 VITALS — BP 138/70 | HR 86 | Temp 101.4°F | Resp 22 | Ht 62.0 in | Wt 303.0 lb

## 2014-04-30 DIAGNOSIS — J029 Acute pharyngitis, unspecified: Secondary | ICD-10-CM | POA: Diagnosis not present

## 2014-04-30 DIAGNOSIS — R509 Fever, unspecified: Secondary | ICD-10-CM | POA: Insufficient documentation

## 2014-04-30 DIAGNOSIS — R059 Cough, unspecified: Secondary | ICD-10-CM

## 2014-04-30 DIAGNOSIS — R05 Cough: Secondary | ICD-10-CM

## 2014-04-30 DIAGNOSIS — R5081 Fever presenting with conditions classified elsewhere: Secondary | ICD-10-CM

## 2014-04-30 DIAGNOSIS — R042 Hemoptysis: Secondary | ICD-10-CM | POA: Insufficient documentation

## 2014-04-30 DIAGNOSIS — R079 Chest pain, unspecified: Secondary | ICD-10-CM | POA: Insufficient documentation

## 2014-04-30 DIAGNOSIS — R6889 Other general symptoms and signs: Secondary | ICD-10-CM

## 2014-04-30 LAB — CBC W/MCH & 3 PART DIFF
HEMATOCRIT: 35.9 % — AB (ref 36.0–46.0)
Hemoglobin: 11.4 g/dL — ABNORMAL LOW (ref 12.0–15.0)
Lymphocytes Relative: 21 % (ref 12–46)
Lymphs Abs: 2.5 10*3/uL (ref 0.7–4.0)
MCH: 21.3 pg — AB (ref 26.0–34.0)
MCHC: 31.8 g/dL (ref 30.0–36.0)
MCV: 67 fL — ABNORMAL LOW (ref 78.0–100.0)
NEUTROS ABS: 8.9 10*3/uL — AB (ref 1.7–7.7)
NEUTROS PCT: 75 % (ref 43–77)
Platelets: 332 10*3/uL (ref 150–400)
RBC: 5.36 MIL/uL — ABNORMAL HIGH (ref 3.87–5.11)
RDW: 19.1 % — AB (ref 11.5–15.5)
WBC mixed population %: 4 % (ref 3–18)
WBC: 11.9 10*3/uL — ABNORMAL HIGH (ref 4.0–10.5)
WBCMIX: 0.5 10*3/uL (ref 0.1–1.8)

## 2014-04-30 LAB — RAPID STREP SCREEN (MED CTR MEBANE ONLY): STREPTOCOCCUS, GROUP A SCREEN (DIRECT): NEGATIVE

## 2014-04-30 LAB — INFLUENZA A AND B
INFLUENZA A AG: NEGATIVE
Influenza B Ag: NEGATIVE

## 2014-04-30 MED ORDER — CEFTRIAXONE SODIUM 1 G IJ SOLR
500.0000 mg | Freq: Once | INTRAMUSCULAR | Status: AC
  Administered 2014-04-30: 500 mg via INTRAMUSCULAR

## 2014-04-30 MED ORDER — GUAIFENESIN-CODEINE 100-10 MG/5ML PO SOLN: 5.0000 mL | ORAL | Status: DC | PRN

## 2014-04-30 NOTE — Progress Notes (Signed)
Patient ID: Monique Gomez, female   DOB: 1955-05-03, 58 y.o.   MRN: [id number]   Subjective:    Patient ID: Monique Gomez, female    DOB: 23-Aug-1955, 58 y.o.   MRN: [id number]  Patient presents for Illness  patient here with cough with blood-tinged sputum, high fever 103F which started on Sunday. She is also achy all over with sore throat no nausea vomiting no diarrhea. She's been taking Tylenol for her fever. She's not had any wheezing or shortness of breath. 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    Review Of Systems:  GEN- denies fatigue,+ fever, weight loss,weakness, recent illness HEENT- denies eye drainage, change in vision, +nasal discharge, CVS- denies chest pain, palpitations RESP- denies SOB,+ cough, wheeze ABD- denies N/V, change in stools, abd pain GU- denies dysuria, hematuria, dribbling, incontinence MSK- denies joint pain, muscle aches, injury Neuro- denies headache, dizziness, syncope, seizure activity       Objective:    BP 138/70 mmHg  Pulse 86  Temp(Src) 101.4 F (38.6 C) (Oral)  Resp 22  Ht 5\' 2"  (1.575 m)  Wt 303 lb (137.44 kg)  BMI 55.41 kg/m2 GEN- NAD, alert and oriented x3,febrile HEENT- PERRL, EOMI, non injected sclera, pink conjunctiva, MMM, oropharynx mild injection, TM clear bilat no effusion, no maxillary sinus tenderness,  Nares clear Neck- Supple, shotty LAD CVS- RRR, no murmur RESP-decreased at bases otherwise clear EXT- No edema Pulses- Radial 2+  Strep Neg Flu- neg      Assessment & Plan:      Problem List Items Addressed This Visit    None    Visit Diagnoses    Acute pharyngitis, unspecified pharyngitis type    -  Primary    Relevant Medications       cefTRIAXone (ROCEPHIN) injection 500 mg (Completed)    Other Relevant Orders       Rapid Strep Screen (Completed)    Flu-like  symptoms        Flu neg, CXR neg, strep neg, viral illness, sputum was sent, but no cavitary lesions on xray, robitussin AC    Fever presenting with conditions classified elsewhere        Relevant Medications       cefTRIAXone (ROCEPHIN) injection 500 mg (Completed)    Other Relevant Orders       Influenza a and b (Completed)       DG Chest 2 View (Completed)       CBC w/MCH & 3 Part Diff (Completed)    Cough        Flu neg, concern for possible PNA, ? TB exposure, sputum AFB done and CBC with diff, given shot of rocephin until I get results back    Relevant Medications       cefTRIAXone (ROCEPHIN) injection 500 mg (Completed)    Other Relevant Orders       DG Chest 2 View (Completed)       AFB culture with smear       Note: This dictation was prepared with Dragon dictation along with smaller phrase technology. Any transcriptional errors that result from this process are unintentional.

## 2014-04-30 NOTE — Patient Instructions (Signed)
Go get the CXR We will call with lab results F/U pending results

## 2014-06-12 LAB — AFB CULTURE WITH SMEAR (NOT AT ARMC): ACID FAST SMEAR: NONE SEEN

## 2014-06-24 LAB — HM DIABETES EYE EXAM

## 2014-09-23 ENCOUNTER — Other Ambulatory Visit: Payer: Self-pay | Admitting: Family Medicine

## 2014-11-25 ENCOUNTER — Ambulatory Visit (INDEPENDENT_AMBULATORY_CARE_PROVIDER_SITE_OTHER): Payer: BLUE CROSS/BLUE SHIELD | Admitting: Family Medicine

## 2014-11-25 ENCOUNTER — Encounter: Payer: Self-pay | Admitting: Family Medicine

## 2014-11-25 VITALS — BP 136/82 | HR 76 | Temp 98.7°F | Resp 16 | Ht 62.0 in | Wt 313.0 lb

## 2014-11-25 DIAGNOSIS — E785 Hyperlipidemia, unspecified: Secondary | ICD-10-CM

## 2014-11-25 DIAGNOSIS — D509 Iron deficiency anemia, unspecified: Secondary | ICD-10-CM | POA: Diagnosis not present

## 2014-11-25 DIAGNOSIS — K219 Gastro-esophageal reflux disease without esophagitis: Secondary | ICD-10-CM | POA: Diagnosis not present

## 2014-11-25 DIAGNOSIS — G4733 Obstructive sleep apnea (adult) (pediatric): Secondary | ICD-10-CM

## 2014-11-25 DIAGNOSIS — Z1211 Encounter for screening for malignant neoplasm of colon: Secondary | ICD-10-CM | POA: Diagnosis not present

## 2014-11-25 DIAGNOSIS — K649 Unspecified hemorrhoids: Secondary | ICD-10-CM | POA: Insufficient documentation

## 2014-11-25 DIAGNOSIS — E119 Type 2 diabetes mellitus without complications: Secondary | ICD-10-CM

## 2014-11-25 DIAGNOSIS — I10 Essential (primary) hypertension: Secondary | ICD-10-CM

## 2014-11-25 LAB — CBC WITH DIFFERENTIAL/PLATELET
BASOS PCT: 0 % (ref 0–1)
Basophils Absolute: 0 10*3/uL (ref 0.0–0.1)
Eosinophils Absolute: 0.2 10*3/uL (ref 0.0–0.7)
Eosinophils Relative: 2 % (ref 0–5)
HEMATOCRIT: 36.1 % (ref 36.0–46.0)
HEMOGLOBIN: 10.9 g/dL — AB (ref 12.0–15.0)
LYMPHS ABS: 2 10*3/uL (ref 0.7–4.0)
Lymphocytes Relative: 22 % (ref 12–46)
MCH: 19.7 pg — ABNORMAL LOW (ref 26.0–34.0)
MCHC: 30.2 g/dL (ref 30.0–36.0)
MCV: 65.4 fL — ABNORMAL LOW (ref 78.0–100.0)
MONO ABS: 0.5 10*3/uL (ref 0.1–1.0)
MPV: 9.2 fL (ref 8.6–12.4)
Monocytes Relative: 6 % (ref 3–12)
NEUTROS PCT: 70 % (ref 43–77)
Neutro Abs: 6.2 10*3/uL (ref 1.7–7.7)
Platelets: 308 10*3/uL (ref 150–400)
RBC: 5.52 MIL/uL — ABNORMAL HIGH (ref 3.87–5.11)
RDW: 18.8 % — AB (ref 11.5–15.5)
WBC: 8.9 10*3/uL (ref 4.0–10.5)

## 2014-11-25 LAB — COMPREHENSIVE METABOLIC PANEL
ALBUMIN: 3.8 g/dL (ref 3.6–5.1)
ALT: 7 U/L (ref 6–29)
AST: 9 U/L — ABNORMAL LOW (ref 10–35)
Alkaline Phosphatase: 84 U/L (ref 33–130)
BUN: 12 mg/dL (ref 7–25)
CO2: 28 mEq/L (ref 20–31)
Calcium: 9.1 mg/dL (ref 8.6–10.4)
Chloride: 105 mEq/L (ref 98–110)
Creat: 0.8 mg/dL (ref 0.50–1.05)
Glucose, Bld: 118 mg/dL — ABNORMAL HIGH (ref 70–99)
Potassium: 3.9 mEq/L (ref 3.5–5.3)
SODIUM: 144 meq/L (ref 135–146)
TOTAL PROTEIN: 7.2 g/dL (ref 6.1–8.1)
Total Bilirubin: 0.3 mg/dL (ref 0.2–1.2)

## 2014-11-25 LAB — LIPID PANEL
CHOL/HDL RATIO: 3.6 ratio (ref <=5.0)
Cholesterol: 147 mg/dL (ref 125–200)
HDL: 41 mg/dL — AB (ref >=46)
LDL CALC: 91 mg/dL (ref <130)
Triglycerides: 73 mg/dL (ref <150)
VLDL: 15 mg/dL (ref <30)

## 2014-11-25 LAB — IRON: Iron: 37 ug/dL — ABNORMAL LOW (ref 42–145)

## 2014-11-25 LAB — HEMOGLOBIN A1C
HEMOGLOBIN A1C: 6.9 % — AB (ref <5.7)
MEAN PLASMA GLUCOSE: 151 mg/dL — AB (ref <117)

## 2014-11-25 LAB — TSH: TSH: 2.687 u[IU]/mL (ref 0.350–4.500)

## 2014-11-25 MED ORDER — LISINOPRIL 10 MG PO TABS: ORAL_TABLET | ORAL | Status: DC

## 2014-11-25 MED ORDER — ATORVASTATIN CALCIUM 20 MG PO TABS: 20.0000 mg | ORAL_TABLET | Freq: Every day | ORAL | Status: DC

## 2014-11-25 MED ORDER — ATENOLOL 25 MG PO TABS: ORAL_TABLET | ORAL | Status: DC

## 2014-11-25 MED ORDER — APIXABAN 5 MG PO TABS: 5.0000 mg | ORAL_TABLET | Freq: Two times a day (BID) | ORAL | Status: DC

## 2014-11-25 MED ORDER — PRAMOXINE HCL 1 % RE FOAM: 1.0000 "application " | Freq: Three times a day (TID) | RECTAL | Status: DC | PRN

## 2014-11-25 MED ORDER — METFORMIN HCL ER 500 MG PO TB24: 500.0000 mg | ORAL_TABLET | Freq: Every evening | ORAL | Status: DC

## 2014-11-25 MED ORDER — PANTOPRAZOLE SODIUM 40 MG PO TBEC: DELAYED_RELEASE_TABLET | ORAL | Status: DC

## 2014-11-25 NOTE — Assessment & Plan Note (Signed)
Diabetes has been well-controlled last A1c was 6.6% we'll recheck today she is on statin drug and ACE inhibitor

## 2014-11-25 NOTE — Assessment & Plan Note (Signed)
She continues to benefit from the use of C Pap is going to schedule follow-up with her cardiologist

## 2014-11-25 NOTE — Patient Instructions (Addendum)
Referral to GI- Dr. Oneida Alar Use the proctofoam Continue current medications Referral to nutritionist F/U 3 months

## 2014-11-25 NOTE — Assessment & Plan Note (Signed)
We'll give proctoscopy found to be use. She is also on stool softeners. I will refer her to gastroenterology she also needs colonoscopy to evaluate the extent of her hemorrhoids

## 2014-11-25 NOTE — Progress Notes (Signed)
Patient ID: Monique Gomez, female   DOB: 09/03/1955, 59 y.o.   MRN: [id number]   Subjective:    Patient ID: Monique Gomez, female    DOB: [birth year], 59 y.o.   MRN: [id number]  Patient presents for Medication Review/ Refill and Hemorrhoid Issues Pt here to f/u , she has not been seen since Gomez 2015. History of Afib, OSA, DM, HTN, morbid obesity. She is taking all her meds as prescribed, has not followed up with cardiology since last summer. She has gained 10lbs since last visit Her only concern today is severe hemorrhoidal bleeding which she has had on an off for many years. She has used tucks pads and prep H sitz baths, she takes stools softners, denies constipation but stopped iron tabs just in case. She gets prolapse of the hemorroids Has not had colonoscopy     Review Of Systems: per above   GEN- denies fatigue, fever, weight loss,weakness, recent illness HEENT- denies eye drainage, change in vision, nasal discharge, CVS- denies chest pain, palpitations RESP- denies SOB, cough, wheeze ABD- denies N/V, change in stools, abd pain GU- denies dysuria, hematuria, dribbling, incontinence MSK- denies joint pain, muscle aches, injury Neuro- denies headache, dizziness, syncope, seizure activity       Objective:    BP 136/82 mmHg  Pulse 76  Temp(Src) 98.7 F (37.1 C) (Oral)  Resp 16  Ht 5\' 2"  (1.575 m)  Wt 313 lb (141.976 kg)  BMI 57.23 kg/m2 GEN- NAD, alert and oriented x3, morbid obesity  HEENT- PERRL, EOMI, non injected sclera, pink conjunctiva, MMM, oropharynx clear Neck- Supple, no thyromegaly CVS- RRR, no murmur RESP-CTAB ABD-NABS,soft,NT,ND EXT- trace edema Pulses- Radial, DP- 2+        Assessment & Plan:      Problem List Items Addressed This Visit    Obstructive sleep apnea   Morbid obesity - Primary   Relevant Medications   metFORMIN (GLUCOPHAGE-XR) 500 MG 24 hr tablet   Hyperlipidemia   Relevant Medications   lisinopril (PRINIVIL,ZESTRIL) 10 MG  tablet   atorvastatin (LIPITOR) 20 MG tablet   atenolol (TENORMIN) 25 MG tablet   apixaban (ELIQUIS) 5 MG TABS tablet   Other Relevant Orders   Lipid panel   Essential hypertension, benign   Relevant Medications   lisinopril (PRINIVIL,ZESTRIL) 10 MG tablet   atorvastatin (LIPITOR) 20 MG tablet   atenolol (TENORMIN) 25 MG tablet   apixaban (ELIQUIS) 5 MG TABS tablet   Other Relevant Orders   TSH   Diabetes mellitus   Relevant Medications   metFORMIN (GLUCOPHAGE-XR) 500 MG 24 hr tablet   lisinopril (PRINIVIL,ZESTRIL) 10 MG tablet   atorvastatin (LIPITOR) 20 MG tablet   Other Relevant Orders   CBC with Differential/Platelet   Comprehensive metabolic panel   Hemoglobin A1c    Other Visit Diagnoses    Gastroesophageal reflux disease without esophagitis        Relevant Medications    pantoprazole (PROTONIX) 40 MG tablet    Essential hypertension        Relevant Medications    lisinopril (PRINIVIL,ZESTRIL) 10 MG tablet    atorvastatin (LIPITOR) 20 MG tablet    atenolol (TENORMIN) 25 MG tablet    apixaban (ELIQUIS) 5 MG TABS tablet    Anemia, iron deficiency        Relevant Orders    Iron       Note: This dictation was prepared with Dragon dictation along with smaller phrase technology. Any transcriptional errors that result from  this process are unintentional.

## 2014-11-25 NOTE — Assessment & Plan Note (Signed)
Blood pressure well controlled

## 2014-11-25 NOTE — Assessment & Plan Note (Signed)
She continues to gain significant weight which is putting her health at very high risk. She is willing to go see a nutritionist. I would not recommend any weight loss medications because of her cardiovascular standpoint

## 2014-11-26 ENCOUNTER — Ambulatory Visit: Payer: Self-pay | Admitting: Family Medicine

## 2014-11-26 ENCOUNTER — Encounter: Payer: Self-pay | Admitting: Internal Medicine

## 2014-12-10 ENCOUNTER — Other Ambulatory Visit: Payer: BLUE CROSS/BLUE SHIELD | Admitting: Obstetrics & Gynecology

## 2014-12-22 ENCOUNTER — Other Ambulatory Visit: Payer: BLUE CROSS/BLUE SHIELD | Admitting: Obstetrics & Gynecology

## 2014-12-22 ENCOUNTER — Ambulatory Visit: Payer: Self-pay | Admitting: Nurse Practitioner

## 2014-12-29 ENCOUNTER — Encounter: Payer: Self-pay | Admitting: Obstetrics & Gynecology

## 2014-12-29 ENCOUNTER — Other Ambulatory Visit: Payer: BLUE CROSS/BLUE SHIELD | Admitting: Obstetrics & Gynecology

## 2014-12-31 ENCOUNTER — Other Ambulatory Visit: Payer: Self-pay | Admitting: Family Medicine

## 2014-12-31 MED ORDER — ATORVASTATIN CALCIUM 20 MG PO TABS: 20.0000 mg | ORAL_TABLET | Freq: Every day | ORAL | Status: DC

## 2014-12-31 NOTE — Telephone Encounter (Signed)
Medication refilled per protocol. 

## 2015-01-07 ENCOUNTER — Ambulatory Visit (INDEPENDENT_AMBULATORY_CARE_PROVIDER_SITE_OTHER): Payer: BLUE CROSS/BLUE SHIELD | Admitting: Nurse Practitioner

## 2015-01-07 ENCOUNTER — Encounter: Payer: Self-pay | Admitting: Cardiology

## 2015-01-07 ENCOUNTER — Telehealth: Payer: Self-pay

## 2015-01-07 ENCOUNTER — Encounter: Payer: Self-pay | Admitting: Nurse Practitioner

## 2015-01-07 ENCOUNTER — Ambulatory Visit (INDEPENDENT_AMBULATORY_CARE_PROVIDER_SITE_OTHER): Payer: BLUE CROSS/BLUE SHIELD | Admitting: Cardiology

## 2015-01-07 VITALS — BP 148/94 | HR 82 | Temp 97.6°F | Ht 62.0 in | Wt 324.4 lb

## 2015-01-07 VITALS — BP 150/96 | HR 98 | Ht 62.0 in | Wt 324.2 lb

## 2015-01-07 DIAGNOSIS — D649 Anemia, unspecified: Secondary | ICD-10-CM

## 2015-01-07 DIAGNOSIS — I4891 Unspecified atrial fibrillation: Secondary | ICD-10-CM | POA: Diagnosis not present

## 2015-01-07 DIAGNOSIS — I480 Paroxysmal atrial fibrillation: Secondary | ICD-10-CM | POA: Diagnosis not present

## 2015-01-07 DIAGNOSIS — I10 Essential (primary) hypertension: Secondary | ICD-10-CM

## 2015-01-07 DIAGNOSIS — Z1211 Encounter for screening for malignant neoplasm of colon: Secondary | ICD-10-CM | POA: Diagnosis not present

## 2015-01-07 NOTE — Telephone Encounter (Signed)
If you are referring to Effient when you said can we hold "it",  yes, she can hold this for 5 days, as she is one year out from her MI.

## 2015-01-07 NOTE — Telephone Encounter (Signed)
This patient was seen in our office today for anemia and a screening colonoscopy. We need to know if she can hold it 48 hours prior to her procedure and possibly 2 weeks if she decides to do hemorrhoid bandings?   Thank you, Burnadette Peter LPN [facility]

## 2015-01-07 NOTE — Assessment & Plan Note (Addendum)
Patient with a history of iron deficiency anemia per PCP note. Does not appear that she has been worked up for this extensively from a GI standpoint. She states she has never had a colonoscopy. At this point we will plan to proceed with a colonoscopy and endoscopy. The patient currently on Eliquis and we will contact Dr. Domenic Polite of cardiology to notify the need to hold for 48 hours prior. I specifically discussed the increased risk of stroke withholding Eliquis and the patient verbalized understanding. Also informed the patient that if she wishes to have hemorrhoid banding that Eliquis would likely need to be held for about 2 weeks after her hemorrhoid banding which should also pose increased risks.  Proceed with colonoscopy and EGD with Dr. Oneida Alar in the near future. The risks (including risk of stroke with holding Eliquis), benefits, and alternatives have been discussed in detail with the patient. They state understanding and desire to proceed.   The patient is not currently on any chronic pain medicines, anxiolytics, antidepressants. Denies drug use. Drinks 2-3 glasses of wine on an occasional weekend. Conscious sedation is likely adequate for her procedure.

## 2015-01-07 NOTE — Patient Instructions (Signed)
1. We will contact Dr. Domenic Polite and Dr. Buelah Manis regarding the need to hold Eliquis for 2 days prior to your procedure as well as possibly 2 weeks after hemorrhoid banding if you elect to do so. 2. We will call you to schedule your procedure once we have information back from them on holding the Eliquis

## 2015-01-07 NOTE — Patient Instructions (Signed)
Your physician wants you to follow-up in: 6 months Dr Ferne Reus will receive a reminder letter in the mail two months in advance. If you don't receive a letter, please call our office to schedule the follow-up appointment.    STOP Aspirin        Thank you for choosing [facility] !

## 2015-01-07 NOTE — Progress Notes (Signed)
Cardiology Office Note  Date: 01/07/2015   ID: Monique Gomez, DOB Mar 20, 1956, MRN [id number]  PCP: Vic Blackbird, MD  Primary Cardiologist: Rozann Lesches, MD   Chief Complaint  Patient presents with  . PAF    History of Present Illness: Monique Gomez is a 59 y.o. female that I last saw in the office back in 2012. She was most recently seen by Ms. Lawrence NP in May 2015 with a history of paroxysmal atrial fibrillation associated with abnormal cardiac markers but normal coronary arteries by cardiac catheterization. She has been maintained on atenolol and Eliquis.  More recent history includes evaluation for iron deficiency anemia with planned colonoscopy and potentially hemorrhoid banding (she was just seen by Mr. Gordy Levan NP in the GI clinic). It is requested that she come off of Eliquis for 48 hours prior to the procedure, may need to stay off longer depending on what needs to be accomplished  During the procedure.  She has not had any substantial increasing burden of atrial fibrillation. She is noted to be in sinus rhythm by ECG today.  Today we discussed the relatively small but not absent risk of stroke off anticoagulation, but it is reassuring that she is in sinus rhythm now, and there is not a specific contraindication to her stopping anticoagulation temporarily for the procedure in question.  She has also been on aspirin, and we discussed stopping this completely.   Past Medical History  Diagnosis Date  . Allergic rhinitis   . Obesity   . Hyperlipidemia   . Essential hypertension, benign   . History of migraine headaches   . Type 2 diabetes mellitus   . Anemia   . PAF (paroxysmal atrial fibrillation) 08/2013  . GERD (gastroesophageal reflux disease)   . XAJOINOM(767.2)     Past Surgical History  Procedure Laterality Date  . Right carpal tunnel release    . Left heart catheterization with coronary angiogram N/A 09/03/2013    Procedure: LEFT HEART CATHETERIZATION  WITH CORONARY ANGIOGRAM;  Surgeon: Jettie Booze, MD;  Location: [facility] CATH LAB;  Service: Cardiovascular;  Laterality: N/A;    Current Outpatient Prescriptions  Medication Sig Dispense Refill  . acetaminophen (TYLENOL) 500 MG tablet Take 1,000 mg by mouth every 6 (six) hours as needed for moderate pain.     Marland Kitchen apixaban (ELIQUIS) 5 MG TABS tablet Take 1 tablet (5 mg total) by mouth 2 (two) times daily. 180 tablet 3  . atenolol (TENORMIN) 25 MG tablet TAKE ONE TABLET BY MOUTH ONCE DAILY 90 tablet 2  . atorvastatin (LIPITOR) 20 MG tablet Take 1 tablet (20 mg total) by mouth daily. 90 tablet 1  . lisinopril (PRINIVIL,ZESTRIL) 10 MG tablet TAKE ONE TABLET BY MOUTH ONCE DAILY 90 tablet 2  . metFORMIN (GLUCOPHAGE-XR) 500 MG 24 hr tablet Take 1 tablet (500 mg total) by mouth every evening. 90 tablet 2  . pantoprazole (PROTONIX) 40 MG tablet TAKE ONE TABLET BY MOUTH ONCE DAILY 90 tablet 2  . pramoxine (PROCTOFOAM) 1 % foam Place 1 application rectally 3 (three) times daily as needed for itching. 15 g 2   No current facility-administered medications for this visit.    Allergies:  Review of patient's allergies indicates no known allergies.   Social History: The patient  reports that she quit smoking about 2 years ago. Her smoking use included Cigarettes. She smoked 0.30 packs per day. She has never used smokeless tobacco. She reports that she drinks alcohol. She reports that  she does not use illicit drugs.   ROS:  Please see the history of present illness. Otherwise, complete review of systems is positive for painful hemorrhoids.  All other systems are reviewed and negative.   Physical Exam: VS:  BP 150/96 mmHg  Pulse 98  Ht 5\' 2"  (1.575 m)  Wt 324 lb 3.2 oz (147.056 kg)  BMI 59.28 kg/m2  SpO2 96%, BMI Body mass index is 59.28 kg/(m^2).  Wt Readings from Last 3 Encounters:  01/07/15 324 lb 3.2 oz (147.056 kg)  01/07/15 324 lb 6.4 oz (147.147 kg)  11/25/14 313 lb (141.976 kg)     General:   Obese woman, appears comfortable at rest. HEENT: Conjunctiva and lids normal, oropharynx clear. Neck: Supple, no elevated JVP or carotid bruits, no thyromegaly. Lungs: Clear to auscultation, nonlabored breathing at rest. Cardiac: Regular rate and rhythm, no S3 or significant systolic murmur, no pericardial rub. Abdomen: Soft, nontender, bowel sounds present, no guarding or rebound. Extremities: No pitting edema, distal pulses 2+. Skin: Warm and dry. Musculoskeletal: No kyphosis. Neuropsychiatric: Alert and oriented x3, affect grossly appropriate.   ECG: ECG is ordered today  An shows sinus rhythm with nonspecific ST-T changes, probable repolarization abnormalities.  Recent Labwork: 11/25/2014: ALT 7; AST 9*; BUN 12; Creat 0.80; Hemoglobin 10.9*; Platelets 308; Potassium 3.9; Sodium 144; TSH 2.687     Component Value Date/Time   CHOL 147 11/25/2014 1112   TRIG 73 11/25/2014 1112   HDL 41* 11/25/2014 1112   CHOLHDL 3.6 11/25/2014 1112   VLDL 15 11/25/2014 1112   LDLCALC 91 11/25/2014 1112    Other Studies Reviewed Today:  Echocardiogram 09/02/2013: Study Conclusions  - Left ventricle: The cavity size was normal. Wall thickness was increased in a pattern of mild to moderateLVH. Systolic function was normal. The estimated ejection fraction was in the range of 60% to 65%. Wall motion was normal; there were no regional wall motion abnormalities. The study is not technically sufficient to allow evaluation of LV diastolic function. - Aortic valve: Mildly calcified annulus. Trileaflet. No significant regurgitation. - Mitral valve: Calcified annulus. Mild regurgitation. - Left atrium: The atrium was at the upper limits of normal in size. - Right ventricle: The cavity size was mildly dilated. - Right atrium: Central venous pressure: 1mm Hg (est). - Atrial septum: No defect or patent foramen ovale was identified. - Tricuspid valve: Mild regurgitation. - Pulmonary  arteries: PA peak pressure: 57mm Hg (S). - Pericardium, extracardiac: There was no pericardial effusion. Impressions:  - Mild to moderate LVH with LVEF 60-65%. Indeterminate diastolic function. Upper normal left atrial size. MAC with mild mitral regurgitation. Mild RV enlargement. Mild tricuspid regurgitation with PASP 33 mmHg.  ASSESSMENT AND PLAN:  1. History of paroxysmal atrial fibrillation, symptomatically stable and in sinus rhythm today by ECG. She is being considered for colonoscopy and possible hemorrhoid banding. It is requested that she come off of Eliquis 48 hours prior to the procedure, and she may need to be off as long as 2 weeks afterwards if she does undergo hemorrhoid banding. We discussed her relatively low risk of stroke while off anticoagulation, although this risk is not 0. It is reassuring however that she has been stable and is in sinus rhythm now. No direct contraindication to stop anticoagulation at this time for requested procedure, and no strong indication for bridging with Lovenox.  I have asked that she stop her aspirin completely.  2. History of normal coronary arteries at cardiac catheterization May 2015.  3.  Essential hypertension, blood pressure elevated today. Keep follow-up with Dr. Buelah Manis for management.  Current medicines were reviewed at length with the patient today.   Orders Placed This Encounter  Procedures  . EKG 12-Lead    Disposition: FU with me in 6 months.   Signed, Satira Sark, MD, [facility] 01/07/2015 3:10 PM    [facility] [address], [place], [place] [address] Phone: [phone]; Fax: [phone]

## 2015-01-07 NOTE — Progress Notes (Signed)
Primary Care Physician:  Vic Blackbird, MD Primary Gastroenterologist:  Dr. Oneida Alar  Chief Complaint  Patient presents with  . Colonoscopy    HPI:   59 year old female presents on referral from PCP for hemorrhoids and colon cancer screening. PCP notes reviewed. Patient with a history of anemia which at last check was low bit worse than her baseline, however it is within her range for the past 1-2 years. She is typically been running in the 11.1-11.4 range however was 10.8 on 12/06/2013 at last check on 11/25/2014 was 10.9.   Today she states she has never had a colonoscopy. She states her anemia runs in her family, she is post-menopausal. She has been anemia for about 10 years. Has never had an EGD. She has had hemorrhoids for about 38 years, since childbirth. They have become progressively worse. Has a flare-up about once a month but they are very uncomfortable and bleed "significantly." Has 1-2 bowel movements a day. She does have hematochezia when she's having a hemorrhoid flare up which is in the water and on the paper. No other hematochezia other than during hemorrhoid flares. Denies melena. Has GERD history and is on PPI which controls her symptoms quite well. Denies abdominal pain, N/V. She is on iron which occasionally constipates her, but typically [place] 4 with no straining. Denies chest pain, dyspnea, dizziness, lightheadedness, syncope, near syncope. Denies any other upper or lower GI symptoms.   Past Medical History  Diagnosis Date  . Allergic rhinitis   . Obesity   . Hyperlipidemia   . Essential hypertension, benign   . History of migraine headaches   . Type 2 diabetes mellitus   . Anemia   . PAF (paroxysmal atrial fibrillation) 08/2013  . GERD (gastroesophageal reflux disease)   . OZHYQMVH(846.9)     Past Surgical History  Procedure Laterality Date  . Right carpal tunnel release    . Left heart catheterization with coronary angiogram N/A 09/03/2013    Procedure:  LEFT HEART CATHETERIZATION WITH CORONARY ANGIOGRAM;  Surgeon: Jettie Booze, MD;  Location: [facility] CATH LAB;  Service: Cardiovascular;  Laterality: N/A;    Current Outpatient Prescriptions  Medication Sig Dispense Refill  . acetaminophen (TYLENOL) 500 MG tablet Take 1,000 mg by mouth every 6 (six) hours as needed for moderate pain.     Marland Kitchen apixaban (ELIQUIS) 5 MG TABS tablet Take 1 tablet (5 mg total) by mouth 2 (two) times daily. 180 tablet 3  . aspirin EC 81 MG EC tablet Take 1 tablet (81 mg total) by mouth daily.    Marland Kitchen atenolol (TENORMIN) 25 MG tablet TAKE ONE TABLET BY MOUTH ONCE DAILY 90 tablet 2  . atorvastatin (LIPITOR) 20 MG tablet Take 1 tablet (20 mg total) by mouth daily. 90 tablet 1  . lisinopril (PRINIVIL,ZESTRIL) 10 MG tablet TAKE ONE TABLET BY MOUTH ONCE DAILY 90 tablet 2  . metFORMIN (GLUCOPHAGE-XR) 500 MG 24 hr tablet Take 1 tablet (500 mg total) by mouth every evening. 90 tablet 2  . pantoprazole (PROTONIX) 40 MG tablet TAKE ONE TABLET BY MOUTH ONCE DAILY 90 tablet 2  . pramoxine (PROCTOFOAM) 1 % foam Place 1 application rectally 3 (three) times daily as needed for itching. 15 g 2   No current facility-administered medications for this visit.    Allergies as of 01/07/2015  . (No Known Allergies)    Family History  Problem Relation Age of Onset  . Diabetes Mother   . Hypertension Mother   . Heart  disease Mother   . Hypertension Brother     Social History   Social History  . Marital Status: Single    Spouse Name: N/A  . Number of Children: N/A  . Years of Education: N/A   Occupational History  . Social worker    Social History Main Topics  . Smoking status: Former Smoker -- 0.30 packs/day    Types: Cigarettes    Quit date: 04/01/2012  . Smokeless tobacco: Never Used  . Alcohol Use: No  . Drug Use: No  . Sexual Activity: Not on file   Other Topics Concern  . Not on file   Social History Narrative    Review of Systems: General: Negative for  anorexia, weight loss, fever, chills, fatigue, weakness. Eyes: Negative for vision changes.  ENT: Negative for hoarseness, difficulty swallowing. CV: Negative for chest pain, angina, peripheral edema.  Respiratory: Negative for dyspnea at rest, cough, sputum, wheezing.  GI: See history of present illness. Derm: Negative for rash or itching.  Endo: Negative for unusual weight change.  Heme: Negative for bruising or bleeding. Allergy: Negative for rash or hives.    Physical Exam: BP 148/94 mmHg  Pulse 82  Temp(Src) 97.6 F (36.4 C) (Oral)  Ht 5\' 2"  (1.575 m)  Wt 324 lb 6.4 oz (147.147 kg)  BMI 59.32 kg/m2 General:   Alert and oriented. Pleasant and cooperative. Well-nourished and well-developed.  Head:  Normocephalic and atraumatic. Eyes:  Without icterus, sclera clear and conjunctiva pink.  Ears:  Normal auditory acuity. Cardiovascular:  HR irregularly irregular, without murmurs appreciated. Normal pulses noted. Extremities without clubbing or edema. Respiratory:  Clear to auscultation bilaterally. No wheezes, rales, or rhonchi. No distress.  Gastrointestinal:  +BS, morbidly obese, soft, non-tender and non-distended. No HSM noted. No guarding or rebound. No masses appreciated.  Rectal:  Deferred  Skin:  Intact without significant lesions or rashes. Neurologic:  Alert and oriented x4;  grossly normal neurologically. Psych:  Alert and cooperative. Normal mood and affect. Heme/Lymph/Immune: No excessive bruising noted.    01/07/2015 9:04 AM

## 2015-01-08 ENCOUNTER — Other Ambulatory Visit: Payer: Self-pay

## 2015-01-08 DIAGNOSIS — Z1211 Encounter for screening for malignant neoplasm of colon: Secondary | ICD-10-CM

## 2015-01-08 DIAGNOSIS — D649 Anemia, unspecified: Secondary | ICD-10-CM

## 2015-01-08 MED ORDER — PEG 3350-KCL-NA BICARB-NACL 420 G PO SOLR: 4000.0000 mL | Freq: Once | ORAL | Status: DC

## 2015-01-08 NOTE — Progress Notes (Signed)
cc'ed to pcp °

## 2015-01-08 NOTE — Telephone Encounter (Signed)
Spoke with pt and she is set for procedures on 02/02/2015 with [facility].  Pt agreed on possible banding and is aware of holding the Eliquis prior to and after the procedure.   Mailed instructions

## 2015-01-08 NOTE — Assessment & Plan Note (Signed)
59 year old female with anemia as a past medical history who is never had a colonoscopy before. She does admit hemorrhoids for decades which become progressively worse. Admits hematochezia which is having hemorrhoid flare. No other red flag/warning signs or symptoms. At this point we'll proceed with colonoscopy as well as endoscopy to further evaluate for cancer screening as well as anemia.

## 2015-01-08 NOTE — Telephone Encounter (Signed)
Per cardiology note: "It is requested that she come off of Eliquis 48 hours prior to the procedure, and she may need to be off as long as 2 weeks afterwards if she does undergo hemorrhoid banding. We discussed her relatively low risk of stroke while off anticoagulation, although this risk is not 0. It is reassuring however that she has been stable and is in sinus rhythm now. No direct contraindication to stop anticoagulation at this time for requested procedure, and no strong indication for bridging with Lovenox."  Please call and schedule patient. Also, please ask her if she'd like to move forward with +/- hemorrhoid banding (knowing she'd have to be off Eliquis for 2 weeks, as per our discussion and the discussion with cardiology)

## 2015-01-08 NOTE — Telephone Encounter (Signed)
Called pt and LMOM.  

## 2015-01-08 NOTE — Telephone Encounter (Signed)
I'm sorry, I ment to put the medication name in the note.   Thank you!

## 2015-01-12 ENCOUNTER — Ambulatory Visit (INDEPENDENT_AMBULATORY_CARE_PROVIDER_SITE_OTHER): Payer: BLUE CROSS/BLUE SHIELD | Admitting: Orthopedic Surgery

## 2015-01-12 ENCOUNTER — Encounter: Payer: Self-pay | Admitting: Orthopedic Surgery

## 2015-01-12 VITALS — BP 160/93 | Ht 62.0 in | Wt 324.4 lb

## 2015-01-12 DIAGNOSIS — M65341 Trigger finger, right ring finger: Secondary | ICD-10-CM | POA: Diagnosis not present

## 2015-01-12 DIAGNOSIS — M6588 Other synovitis and tenosynovitis, other site: Secondary | ICD-10-CM

## 2015-01-12 DIAGNOSIS — M659 Synovitis and tenosynovitis, unspecified: Secondary | ICD-10-CM

## 2015-01-12 NOTE — Telephone Encounter (Signed)
Noted  

## 2015-01-12 NOTE — Patient Instructions (Signed)
Wear thumb brace.

## 2015-01-12 NOTE — Progress Notes (Signed)
Patient ID: Monique Gomez, female   DOB: 1955/09/29, 59 y.o.   MRN: [id number] NEW   Chief Complaint  Patient presents with  . Hand Problem    Catching locking right ring finger pain metacarpophalangeal joint left thumb     Monique Gomez is a 59 y.o. female.   HPI This is a 59 year old female who is noted to be on blood thinner for her paroxysmal ATRIAL FIBRILLATION who presents for evaluation of pain in her right ring finger and left thumb which has been present for month. The right ring finger has experienced catching and locking she has some numbness and tingling intermittently in the right and left hand. Her pain is described as aching, constant, 8 out of 10. 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 She also has a history of carpal tunnel and a right de Quervain's release. She also has been noncompliant with splinting of the left thumb  Review of systems sinusitis atrial fibrillation seasonal allergy swollen and stiff joints joint pain   Review of Systems See hpi  Past Medical History  Diagnosis Date  . Allergic rhinitis   . Obesity   . Hyperlipidemia   . Essential hypertension, benign   . History of migraine headaches   . Type 2 diabetes mellitus   . Anemia   . PAF (paroxysmal atrial fibrillation) 08/2013  . GERD (gastroesophageal reflux disease)   . STMHDQQI(297.9)     Past Surgical History  Procedure Laterality Date  . Right carpal tunnel release    . Left heart catheterization with coronary angiogram N/A 09/03/2013    Procedure: LEFT HEART CATHETERIZATION WITH CORONARY ANGIOGRAM;  Surgeon: Jettie Booze, MD;  Location: [facility] CATH LAB;  Service: Cardiovascular;  Laterality: N/A;    Family History  Problem Relation Age of Onset  . Diabetes Mother   . Hypertension Mother   . Heart disease Mother   . Hypertension Brother   .  Colon cancer Neg Hx     Social History Social History  Substance Use Topics  . Smoking status: Former Smoker -- 0.30 packs/day    Types: Cigarettes    Quit date: 04/01/2012  . Smokeless tobacco: Never Used  . Alcohol Use: 0.0 oz/week    0 Standard drinks or equivalent per week     Comment: Wine typically 2-3 glasses every few weekends    No Known Allergies  Current Outpatient Prescriptions  Medication Sig Dispense Refill  . acetaminophen (TYLENOL) 500 MG tablet Take 1,000 mg by mouth every 6 (six) hours as needed for moderate pain.     Marland Kitchen apixaban (ELIQUIS) 5 MG TABS tablet Take 1 tablet (5 mg total) by mouth 2 (two) times daily. 180 tablet 3  . atenolol (TENORMIN) 25 MG tablet TAKE ONE TABLET BY MOUTH ONCE DAILY 90 tablet 2  . atorvastatin (LIPITOR) 20 MG tablet Take 1 tablet (20 mg total) by mouth daily. 90 tablet 1  . lisinopril (PRINIVIL,ZESTRIL) 10 MG tablet TAKE ONE TABLET BY MOUTH ONCE DAILY 90 tablet 2  . metFORMIN (GLUCOPHAGE-XR) 500 MG 24 hr tablet Take 1 tablet (500 mg total) by mouth every evening. 90 tablet 2  . pantoprazole (PROTONIX) 40 MG tablet TAKE ONE TABLET BY MOUTH ONCE DAILY 90 tablet 2  . pramoxine (PROCTOFOAM) 1 % foam Place 1 application rectally 3 (three) times daily as needed  for itching. 15 g 2   No current facility-administered medications for this visit.       Physical Exam Blood pressure 160/93, height 5\' 2"  (1.575 m), weight 324 lb 6.4 oz (147.147 kg). Physical Exam The patient is well developed well nourished and well groomed. Orientation to person place and time is normal  Mood is pleasant. Ambulatory status is normal without a limp Right ring finger did not catch or lock that she has tenderness over the A1 pulley pain with flexion but full extension and normal strength in the FDP and FDS. She has no carpal tunnel symptoms. She has a scar over the right first extensor compartment from previous surgery  Left thumb is tender over the A1 pulley  no instability grind test is negative joint range of motion is normal painful flexion is noted flexion tendon is intact  Skin remains intact without laceration ulceration or erythema Gross motor exam is intact without atrophy. Muscle tone normal grade 5 motor strength Neurovascular exam remains intact  Data Reviewed NONE Assessment Encounter Diagnoses  Name Primary?  . Trigger ring finger of right hand Yes  . Tenosynovitis of finger     Plan Recommend splinting of the left and hold on cortisone injection due to diabetes  Inject right ring finger since it is locking on her   Injection for triggering of the finger  Injection site: Right ring finger Permission to inject granted. Timeout to confirm injection site.  The finger was prepped with alcohol and ethyl chloride. We injected 40 mg of Depo-Medrol and 2 mL 1% lidocaine at the A1 pulley  No complications were noted sterile dressing applied. Instructions given post injection for any problems.

## 2015-01-20 ENCOUNTER — Telehealth: Payer: Self-pay | Admitting: Orthopedic Surgery

## 2015-01-20 NOTE — Telephone Encounter (Signed)
Returned call, no answer.

## 2015-01-20 NOTE — Telephone Encounter (Signed)
Has to use tylenol   On eliquis  Also can try aspercreme or capzacin

## 2015-01-20 NOTE — Telephone Encounter (Signed)
Routing to Dr Harrison 

## 2015-01-20 NOTE — Telephone Encounter (Signed)
Patient is calling C/O left hand pain and swelling, she is asking for an anti inflammatory and something for pain for the left hand until she can be seen on 02/09/15, please advise?

## 2015-01-21 NOTE — Telephone Encounter (Signed)
Returned call, no answer.

## 2015-01-26 ENCOUNTER — Other Ambulatory Visit: Payer: BLUE CROSS/BLUE SHIELD | Admitting: Obstetrics & Gynecology

## 2015-01-28 ENCOUNTER — Encounter: Payer: Self-pay | Admitting: *Deleted

## 2015-01-28 ENCOUNTER — Other Ambulatory Visit: Payer: BLUE CROSS/BLUE SHIELD | Admitting: Obstetrics & Gynecology

## 2015-02-02 ENCOUNTER — Ambulatory Visit (HOSPITAL_COMMUNITY)
Admission: RE | Admit: 2015-02-02 | Discharge: 2015-02-02 | Disposition: A | Discharge status: Home / Self Care | Payer: BLUE CROSS/BLUE SHIELD | Source: Ambulatory Visit | Attending: Gastroenterology | Admitting: Gastroenterology

## 2015-02-02 ENCOUNTER — Encounter (HOSPITAL_COMMUNITY)
Admission: RE | Disposition: A | Discharge status: Home / Self Care | Payer: Self-pay | Source: Ambulatory Visit | Attending: Gastroenterology

## 2015-02-02 ENCOUNTER — Encounter (HOSPITAL_COMMUNITY): Payer: Self-pay | Admitting: *Deleted

## 2015-02-02 DIAGNOSIS — K449 Diaphragmatic hernia without obstruction or gangrene: Secondary | ICD-10-CM | POA: Diagnosis not present

## 2015-02-02 DIAGNOSIS — I10 Essential (primary) hypertension: Secondary | ICD-10-CM | POA: Diagnosis not present

## 2015-02-02 DIAGNOSIS — K648 Other hemorrhoids: Secondary | ICD-10-CM | POA: Insufficient documentation

## 2015-02-02 DIAGNOSIS — D125 Benign neoplasm of sigmoid colon: Secondary | ICD-10-CM | POA: Insufficient documentation

## 2015-02-02 DIAGNOSIS — Z79899 Other long term (current) drug therapy: Secondary | ICD-10-CM | POA: Insufficient documentation

## 2015-02-02 DIAGNOSIS — K222 Esophageal obstruction: Secondary | ICD-10-CM | POA: Diagnosis not present

## 2015-02-02 DIAGNOSIS — K644 Residual hemorrhoidal skin tags: Secondary | ICD-10-CM | POA: Insufficient documentation

## 2015-02-02 DIAGNOSIS — D649 Anemia, unspecified: Secondary | ICD-10-CM | POA: Diagnosis not present

## 2015-02-02 DIAGNOSIS — E119 Type 2 diabetes mellitus without complications: Secondary | ICD-10-CM | POA: Insufficient documentation

## 2015-02-02 DIAGNOSIS — I480 Paroxysmal atrial fibrillation: Secondary | ICD-10-CM | POA: Diagnosis not present

## 2015-02-02 DIAGNOSIS — E669 Obesity, unspecified: Secondary | ICD-10-CM | POA: Insufficient documentation

## 2015-02-02 DIAGNOSIS — Z7984 Long term (current) use of oral hypoglycemic drugs: Secondary | ICD-10-CM | POA: Diagnosis not present

## 2015-02-02 DIAGNOSIS — Z7901 Long term (current) use of anticoagulants: Secondary | ICD-10-CM | POA: Insufficient documentation

## 2015-02-02 DIAGNOSIS — K2970 Gastritis, unspecified, without bleeding: Secondary | ICD-10-CM | POA: Diagnosis not present

## 2015-02-02 DIAGNOSIS — K625 Hemorrhage of anus and rectum: Secondary | ICD-10-CM | POA: Insufficient documentation

## 2015-02-02 DIAGNOSIS — K5730 Diverticulosis of large intestine without perforation or abscess without bleeding: Secondary | ICD-10-CM | POA: Insufficient documentation

## 2015-02-02 DIAGNOSIS — E785 Hyperlipidemia, unspecified: Secondary | ICD-10-CM | POA: Insufficient documentation

## 2015-02-02 DIAGNOSIS — Q438 Other specified congenital malformations of intestine: Secondary | ICD-10-CM | POA: Diagnosis not present

## 2015-02-02 DIAGNOSIS — K2950 Unspecified chronic gastritis without bleeding: Secondary | ICD-10-CM | POA: Insufficient documentation

## 2015-02-02 DIAGNOSIS — Z6841 Body Mass Index (BMI) 40.0 and over, adult: Secondary | ICD-10-CM | POA: Diagnosis not present

## 2015-02-02 DIAGNOSIS — B9681 Helicobacter pylori [H. pylori] as the cause of diseases classified elsewhere: Secondary | ICD-10-CM | POA: Insufficient documentation

## 2015-02-02 DIAGNOSIS — K635 Polyp of colon: Secondary | ICD-10-CM | POA: Diagnosis not present

## 2015-02-02 DIAGNOSIS — Z1211 Encounter for screening for malignant neoplasm of colon: Secondary | ICD-10-CM

## 2015-02-02 HISTORY — PX: ESOPHAGOGASTRODUODENOSCOPY: SHX5428

## 2015-02-02 HISTORY — PX: COLONOSCOPY: SHX5424

## 2015-02-02 LAB — GLUCOSE, CAPILLARY: Glucose-Capillary: 112 mg/dL — ABNORMAL HIGH (ref 65–99)

## 2015-02-02 LAB — FERRITIN: FERRITIN: 21 ng/mL (ref 11–307)

## 2015-02-02 SURGERY — COLONOSCOPY
Anesthesia: Moderate Sedation

## 2015-02-02 MED ORDER — MIDAZOLAM HCL 5 MG/5ML IJ SOLN
INTRAMUSCULAR | Status: AC
  Filled 2015-02-02: qty 10

## 2015-02-02 MED ORDER — SODIUM CHLORIDE 0.9 % IV SOLN
INTRAVENOUS | Status: DC
  Administered 2015-02-02: 11:00:00 via INTRAVENOUS

## 2015-02-02 MED ORDER — MIDAZOLAM HCL 5 MG/5ML IJ SOLN
INTRAMUSCULAR | Status: DC | PRN
  Administered 2015-02-02 (×2): 2 mg via INTRAVENOUS
  Administered 2015-02-02 (×2): 1 mg via INTRAVENOUS
  Administered 2015-02-02: 2 mg via INTRAVENOUS
  Administered 2015-02-02: 1 mg via INTRAVENOUS

## 2015-02-02 MED ORDER — MEPERIDINE HCL 100 MG/ML IJ SOLN
INTRAMUSCULAR | Status: AC
  Filled 2015-02-02: qty 2

## 2015-02-02 MED ORDER — MEPERIDINE HCL 100 MG/ML IJ SOLN
INTRAMUSCULAR | Status: DC | PRN
  Administered 2015-02-02 (×2): 25 mg via INTRAVENOUS
  Administered 2015-02-02: 50 mg via INTRAVENOUS

## 2015-02-02 MED ORDER — LIDOCAINE VISCOUS 2 % MT SOLN
OROMUCOSAL | Status: AC
  Filled 2015-02-02: qty 15

## 2015-02-02 NOTE — Discharge Instructions (Signed)
You had 1 polyp removed. You have DIVERTICULOSIS IN YOUR LEFT COLON AND small internal AND LARGE EXTERNAL hemorrhoids. I DID NOT BAND THEM BECAUSE THE MAJORITY OF YOUR HEMORRHOID PROBLEM IS EXTERNAL AND THE ONLY WAY TO FIX THEM IS SURGERY. You have an esophageal stricture, mild gastritis, & a moderate large HIATAL HERNIA. I biopsied your stomach, & SMALL BOWEL   Re-start ELIQUIS OCT 7.  DRINK WATER TO KEEP YOUR URINE LIGHT YELLOW.  CONTINUE YOUR WEIGHT LOSS EFFORTS. YOUR BODY MASS INDEX IS OVER 40 WHICH MEANS YOU ARE MORBIDLY OBESE. OBESITY IS ASSOCIATED WITH AN INCREASE FOR ALL CANCERS, INCLUDING COLON CANCER. BEING MORBIDLY OBESE DECREASES YOUR LIFE EXPECTANCY 10 YEARS.  FOLLOW A HIGH FIBER/LOW FAT DIET. AVOID ITEMS THAT CAUSE BLOATING. SEE INFO BELOW.  AVOID ITEMS THAT TRIGGER GASTRITIS. SEE INFO BELOW.  CONTINUE PROTONIX. TAKE 30 MINUTES PRIOR TO BREAKFAST.   YOUR BIOPSY RESULTS WILL BE AVAILABLE IN MY CHART AFTER OCT 5 AND MY OFFICE WILL CONTACT YOU IN 10-14 DAYS WITH YOUR RESULTS.   Next colonoscopy in 10 years.   ENDOSCOPY Care After Read the instructions outlined below and refer to this sheet in the next week. These discharge instructions provide you with general information on caring for yourself after you leave the hospital. While your treatment has been planned according to the most current medical practices available, unavoidable complications occasionally occur. If you have any problems or questions after discharge, call DR. Logon Uttech, [phone].  ACTIVITY  You may resume your regular activity, but move at a slower pace for the next 24 hours.   Take frequent rest periods for the next 24 hours.   Walking will help get rid of the air and reduce the bloated feeling in your belly (abdomen).   No driving for 24 hours (because of the medicine (anesthesia) used during the test).   You may shower.   Do not sign any important legal documents or operate any machinery for 24  hours (because of the anesthesia used during the test).    NUTRITION  Drink plenty of fluids.   You may resume your normal diet as instructed by your doctor.   Begin with a light meal and progress to your normal diet. Heavy or fried foods are harder to digest and may make you feel sick to your stomach (nauseated).   Avoid alcoholic beverages for 24 hours or as instructed.    MEDICATIONS  You may resume your normal medications.   WHAT YOU CAN EXPECT TODAY  Some feelings of bloating in the abdomen.   Passage of more gas than usual.   Spotting of blood in your stool or on the toilet paper  .  IF YOU HAD POLYPS REMOVED DURING THE ENDOSCOPY:  Eat a soft diet IF YOU HAVE NAUSEA, BLOATING, ABDOMINAL PAIN, OR VOMITING.    FINDING OUT THE RESULTS OF YOUR TEST Not all test results are available during your visit. DR. Oneida Alar WILL CALL YOU WITHIN 14 DAYS OF YOUR PROCEDUE WITH YOUR RESULTS. Do not assume everything is normal if you have not heard from DR. Kadince Boxley, CALL HER OFFICE AT [phone].  SEEK IMMEDIATE MEDICAL ATTENTION AND CALL THE OFFICE: [phone] IF:  You have more than a spotting of blood in your stool.   Your belly is swollen (abdominal distention).   You are nauseated or vomiting.   You have a temperature over 101F.   You have abdominal pain or discomfort that is severe or gets worse throughout the day.   Gastritis  Gastritis is an inflammation (the body's way of reacting to injury and/or infection) of the stomach. It is often caused by viral or bacterial (germ) infections. It can also be caused BY ASPIRIN, BC/GOODY POWDER'S, (IBUPROFEN) MOTRIN, OR ALEVE (NAPROXEN), chemicals (including alcohol), SPICY FOODS, and medications. This illness may be associated with generalized malaise (feeling tired, not well), UPPER ABDOMINAL STOMACH cramps, and fever. One common bacterial cause of gastritis is an organism known as H. Pylori. This can be treated with antibiotics.     Hiatal Hernia A hiatal hernia occurs when a part of the stomach slides above the diaphragm. The diaphragm is the thin muscle separating the belly (abdomen) from the chest. A hiatal hernia can be something you are born with or develop over time. Hiatal hernias may allow stomach acid to flow back into your esophagus, the tube which carries food from your mouth to your stomach. If this acid causes problems it is called GERD (gastro-esophageal reflux disease).   SYMPTOMS Common symptoms of GERD are heartburn (burning in your chest). This is worse when lying down or bending over. It may also cause belching and indigestion. Some of the things which make GERD worse are:  Increased weight pushes on stomach making acid rise more easily.   Smoking markedly increases acid production.   Alcohol decreases lower esophageal sphincter pressure (valve between stomach and esophagus), allowing acid from stomach into esophagus.   Late evening meals and going to bed with a full stomach increases pressure.   HOME CARE INSTRUCTIONS  Try to achieve and maintain an ideal body weight.   Avoid drinking alcoholic beverages.   DO NOT smokE.   Do not wear tight clothing around your chest or stomach.   Eat smaller meals and eat more frequently. This keeps your stomach from getting too full. Eat slowly.   Do not lie down for 2 or 3 hours after eating. Do not eat or drink anything 1 to 2 hours before going to bed.   Avoid caffeine beverages (colas, coffee, cocoa, tea), fatty foods, citrus fruits and all other foods and drinks that contain acid and that seem to increase the problems.   Avoid bending over, especially after eating OR STRAINING. Anything that increases the pressure in your belly increases the amount of acid that may be pushed up into your esophagus.    High-Fiber Diet A high-fiber diet changes your normal diet to include more whole grains, legumes, fruits, and vegetables. Changes in the diet  involve replacing refined carbohydrates with unrefined foods. The calorie level of the diet is essentially unchanged. The Dietary Reference Intake (recommended amount) for adult males is 38 grams per day. For adult females, it is 25 grams per day. Pregnant and lactating women should consume 28 grams of fiber per day. Fiber is the intact part of a plant that is not broken down during digestion. Functional fiber is fiber that has been isolated from the plant to provide a beneficial effect in the body. PURPOSE  Increase stool bulk.   Ease and regulate bowel movements.   Lower cholesterol.  REDUCE RISK OF COLON CANCER  INDICATIONS THAT YOU NEED MORE FIBER  Constipation and hemorrhoids.   Uncomplicated diverticulosis (intestine condition) and irritable bowel syndrome.   Weight management.   As a protective measure against hardening of the arteries (atherosclerosis), diabetes, and cancer.   GUIDELINES FOR INCREASING FIBER IN THE DIET  Start adding fiber to the diet slowly. A gradual increase of about 5 more grams (2 slices  of whole-wheat bread, 2 servings of most fruits or vegetables, or 1 bowl of high-fiber cereal) per day is best. Too rapid an increase in fiber may result in constipation, flatulence, and bloating.   Drink enough water and fluids to keep your urine clear or pale yellow. Water, juice, or caffeine-free drinks are recommended. Not drinking enough fluid may cause constipation.   Eat a variety of high-fiber foods rather than one type of fiber.   Try to increase your intake of fiber through using high-fiber foods rather than fiber pills or supplements that contain small amounts of fiber.   The goal is to change the types of food eaten. Do not supplement your present diet with high-fiber foods, but replace foods in your present diet.   INCLUDE A VARIETY OF FIBER SOURCES  Replace refined and processed grains with whole grains, canned fruits with fresh fruits, and incorporate  other fiber sources. White rice, white breads, and most bakery goods contain little or no fiber.   Brown whole-grain rice, buckwheat oats, and many fruits and vegetables are all good sources of fiber. These include: broccoli, Brussels sprouts, cabbage, cauliflower, beets, sweet potatoes, white potatoes (skin on), carrots, tomatoes, eggplant, squash, berries, fresh fruits, and dried fruits.   Cereals appear to be the richest source of fiber. Cereal fiber is found in whole grains and bran. Bran is the fiber-rich outer coat of cereal grain, which is largely removed in refining. In whole-grain cereals, the bran remains. In breakfast cereals, the largest amount of fiber is found in those with "bran" in their names. The fiber content is sometimes indicated on the label.   You may need to include additional fruits and vegetables each day.   In baking, for 1 cup white flour, you may use the following substitutions:   1 cup whole-wheat flour minus 2 tablespoons.   1/2 cup white flour plus 1/2 cup whole-wheat flour.   Low-Fat Diet BREADS, CEREALS, PASTA, RICE, DRIED PEAS, AND BEANS These products are high in carbohydrates and most are low in fat. Therefore, they can be increased in the diet as substitutes for fatty foods. They too, however, contain calories and should not be eaten in excess. Cereals can be eaten for snacks as well as for breakfast.  Include foods that contain fiber (fruits, vegetables, whole grains, and legumes). Research shows that fiber may lower blood cholesterol levels, especially the water-soluble fiber found in fruits, vegetables, oat products, and legumes. FRUITS AND VEGETABLES It is good to eat fruits and vegetables. Besides being sources of fiber, both are rich in vitamins and some minerals. They help you get the daily allowances of these nutrients. Fruits and vegetables can be used for snacks and desserts. MEATS Limit lean meat, chicken, [place], and fish to no more than 6 ounces  per day. Beef, Pork, and Lamb Use lean cuts of beef, pork, and lamb. Lean cuts include:  Extra-lean ground beef.  Arm roast.  Sirloin tip.  Center-cut ham.  Round steak.  Loin chops.  Rump roast.  Tenderloin.  Trim all fat off the outside of meats before cooking. It is not necessary to severely decrease the intake of red meat, but lean choices should be made. Lean meat is rich in protein and contains a highly absorbable form of iron. Premenopausal women, in particular, should avoid reducing lean red meat because this could increase the risk for low red blood cells (iron-deficiency anemia). The organ meats, such as liver, sweetbreads, kidneys, and brain are very rich in  cholesterol. They should be limited. Chicken and [place] These are good sources of protein. The fat of poultry can be reduced by removing the skin and underlying fat layers before cooking. Chicken and [place] can be substituted for lean red meat in the diet. Poultry should not be fried or covered with high-fat sauces. Fish and Shellfish Fish is a good source of protein. Shellfish contain cholesterol, but they usually are low in saturated fatty acids. The preparation of fish is important. Like chicken and [place], they should not be fried or covered with high-fat sauces. EGGS Egg whites contain no fat or cholesterol. They can be eaten often. Try 1 to 2 egg whites instead of whole eggs in recipes or use egg substitutes that do not contain yolk. MILK AND DAIRY PRODUCTS Use skim or 1% milk instead of 2% or whole milk. Decrease whole milk, natural, and processed cheeses. Use nonfat or low-fat (2%) cottage cheese or low-fat cheeses made from vegetable oils. Choose nonfat or low-fat (1 to 2%) yogurt. Experiment with evaporated skim milk in recipes that call for heavy cream. Substitute low-fat yogurt or low-fat cottage cheese for sour cream in dips and salad dressings. Have at least 2 servings of low-fat dairy products, such as 2 glasses of  skim (or 1%) milk each day to help get your daily calcium intake.  FATS AND OILS Reduce the total intake of fats, especially saturated fat. Butterfat, lard, and beef fats are high in saturated fat and cholesterol. These should be avoided as much as possible. Vegetable fats do not contain cholesterol, but certain vegetable fats, such as coconut oil, palm oil, and palm kernel oil are very high in saturated fats. These should be limited. These fats are often used in bakery goods, processed foods, popcorn, oils, and nondairy creamers. Vegetable shortenings and some peanut butters contain hydrogenated oils, which are also saturated fats. Read the labels on these foods and check for saturated vegetable oils. Unsaturated vegetable oils and fats do not raise blood cholesterol. However, they should be limited because they are fats and are high in calories. Total fat should still be limited to 30% of your daily caloric intake. Desirable liquid vegetable oils are corn oil, cottonseed oil, olive oil, canola oil, safflower oil, soybean oil, and sunflower oil. Peanut oil is not as good, but small amounts are acceptable. Buy a heart-healthy tub margarine that has no partially hydrogenated oils in the ingredients. Mayonnaise and salad dressings often are made from unsaturated fats, but they should also be limited because of their high calorie and fat content. Seeds, nuts, peanut butter, olives, and avocados are high in fat, but the fat is mainly the unsaturated type. These foods should be limited mainly to avoid excess calories and fat. OTHER EATING TIPS Snacks  Most sweets should be limited as snacks. They tend to be rich in calories and fats, and their caloric content outweighs their nutritional value. Some good choices in snacks are graham crackers, melba toast, soda crackers, bagels (no egg), English muffins, fruits, and vegetables. These snacks are preferable to snack crackers, [place] fries, and chips. Popcorn should  be air-popped or cooked in small amounts of liquid vegetable oil. Desserts Eat fruit, low-fat yogurt, and fruit ices. AVOID pastries, cake, and cookies. Sherbet, angel food cake, gelatin dessert, frozen low-fat yogurt, or other frozen products that do not contain saturated fat (pure fruit juice bars, frozen ice pops) are also acceptable.  COOKING METHODS Choose those methods that use little or no fat. They include:  Poaching.  Braising.  Steaming.  Grilling.  Baking.  Stir-frying.  Broiling.  Microwaving.  Foods can be cooked in a nonstick pan without added fat, or use a nonfat cooking spray in regular cookware. Limit fried foods and avoid frying in saturated fat. Add moisture to lean meats by using water, broth, cooking wines, and other nonfat or low-fat sauces along with the cooking methods mentioned above. Soups and stews should be chilled after cooking. The fat that forms on top after a few hours in the refrigerator should be skimmed off. When preparing meals, avoid using excess salt. Salt can contribute to raising blood pressure in some people. EATING AWAY FROM HOME Order entres, potatoes, and vegetables without sauces or butter. When meat exceeds the size of a deck of cards (3 to 4 ounces), the rest can be taken home for another meal. Choose vegetable or fruit salads and ask for low-calorie salad dressings to be served on the side. Use dressings sparingly. Limit high-fat toppings, such as bacon, crumbled eggs, cheese, sunflower seeds, and olives. Ask for heart-healthy tub margarine instead of butter.  Hemorrhoids Hemorrhoids are dilated (enlarged) veins around the rectum. Sometimes clots will form in the veins. This makes them swollen and painful. These are called thrombosed hemorrhoids. Causes of hemorrhoids include:  Constipation.   Straining to have a bowel movement.   HEAVY LIFTING  HOME CARE INSTRUCTIONS  Eat a well balanced diet and drink 6 to 8 glasses of water every day  to avoid constipation. You may also use a bulk laxative.   Avoid straining to have bowel movements.   Keep anal area dry and clean.   Do not use a donut shaped pillow or sit on the toilet for long periods. This increases blood pooling and pain.   Move your bowels when your body has the urge; this will require less straining and will decrease pain and pressure.   Diverticulosis Diverticulosis is a common condition that develops when small pouches (diverticula) form in the wall of the colon. The risk of diverticulosis increases with age. It happens more often in people who eat a low-fiber diet. Most individuals with diverticulosis have no symptoms. Those individuals with symptoms usually experience belly (abdominal) pain, constipation, or loose stools (diarrhea).  HOME CARE INSTRUCTIONS  Increase the amount of fiber in your diet as directed by your caregiver or dietician. This may reduce symptoms of diverticulosis.   Drink at least 6 to 8 glasses of water each day to prevent constipation.   Try not to strain when you have a bowel movement.   Avoiding nuts and seeds to prevent complications is NOT NECESSARY.   FOODS HAVING HIGH FIBER CONTENT INCLUDE:  Fruits. Apple, peach, pear, tangerine, raisins, prunes.   Vegetables. Brussels sprouts, asparagus, broccoli, cabbage, carrot, cauliflower, romaine lettuce, spinach, summer squash, tomato, winter squash, zucchini.   Starchy Vegetables. Baked beans, kidney beans, lima beans, split peas, lentils, potatoes (with skin).   Grains. Whole wheat bread, brown rice, bran flake cereal, plain oatmeal, white rice, shredded wheat, bran muffins.    SEEK IMMEDIATE MEDICAL CARE IF:  You develop increasing pain or severe bloating.   You have an oral temperature above 101F.   You develop vomiting or bowel movements that are bloody or black.

## 2015-02-02 NOTE — Op Note (Signed)
[facility] [address] [place], [address]   ENDOSCOPY PROCEDURE REPORT  PATIENT: Monique Gomez, Monique Gomez  MR#: [id number] BIRTHDATE: 1955/12/30 , 3  yrs. old GENDER: female  ENDOSCOPIST: Danie Binder, MD REFERRED [id number] Cameron, M.D.  PROCEDURE DATE: 02-26-15 PROCEDURE:   EGD w/ biopsy  INDICATIONS:anemia. -SINCE 2011/BMI 59 MEDICATIONS: TCS+ Versed 2 mg IV TOPICAL ANESTHETIC:   Viscous Xylocaine ASA CLASS:  DESCRIPTION OF PROCEDURE:     Physical exam was performed.  Informed consent was obtained from the patient after explaining the benefits, risks, and alternatives to the procedure.  The patient was connected to the monitor and placed in the left lateral position.  Continuous oxygen was provided by nasal cannula and IV medicine administered through an indwelling cannula.  After administration of sedation, the patients esophagus was intubated and the EC-[id number] ([id number])  endoscope was advanced under direct visualization to the second portion of the duodenum.  The scope was removed slowly by carefully examining the color, texture, anatomy, and integrity of the mucosa on the way out.  The patient was recovered in endoscopy and discharged home in satisfactory condition.  Estimated blood loss is zero unless otherwise noted in this procedure report.    ESOPHAGUS: There was a stricture at the gastroesophageal junction. The stricture was easily traversable.  STOMACH: A large hiatal hernia was noted.   Mild non-erosive gastritis (inflammation) was found in the gastric antrum.  Multiple biopsies were performed using cold forceps.   DUODENUM: The duodenal mucosa showed no abnormalities in the bulb and 2nd part of the duodenum.  Cold forceps biopsies were taken in the bulb and second portion. COMPLICATIONS: There were no immediate complications.  ENDOSCOPIC IMPRESSION: 1.   PATENT stricture at the gastroesophageal junction 2.   Large hiatal hernia 3.    MILD Non-erosive GASTRITIS 4.   NO SOURCE FOR ANEMIA IDENTIFIED.  RECOMMENDATIONS: Re-start ELIQUIS OCT 7. DRINK WATER TO KEEP URINE LIGHT YELLOW. CONTINUE YOUR WEIGHT LOSS EFFORTS. FOLLOW A HIGH FIBER/LOW FAT DIET. AVOID ITEMS THAT TRIGGER GASTRITIS. PROTONIX 30 MINUTES PRIOR TO BREAKFAST. AWAIT BIOPSY RESULTS. CHECK FERRITIN TODAY. Next colonoscopy in 10 years.  CONSIDER CPAP AND AN OVERTUBE.  REPEAT EXAM:    eSigned:  Danie Binder, MD 02/26/2015 12:18 PMevised CPT CODES: ICD CODES:  The ICD and CPT codes recommended by this software are interpretations from the data that the clinical staff has captured with the software.  The verification of the translation of this report to the ICD and CPT codes and modifiers is the sole responsibility of the health care institution and practicing physician where this report was generated.  [employer] will not be held responsible for the validity of the ICD and CPT codes included on this report.  AMA assumes no liability for data contained or not contained herein. CPT is a Designer, television/film set of the [employer].

## 2015-02-02 NOTE — H&P (Signed)
Primary Care Physician:  Vic Blackbird, MD Primary Gastroenterologist:  Dr. Oneida Alar  Pre-Procedure History & Physical: HPI:  Monique Gomez is a 59 y.o. female here for Anemia-NORMOCYTIC > 5 YEARS  Past Medical History  Diagnosis Date  . Allergic rhinitis   . Obesity   . Hyperlipidemia   . Essential hypertension, benign   . History of migraine headaches   . Type 2 diabetes mellitus ([place])   . Anemia   . PAF (paroxysmal atrial fibrillation) ([place]) 08/2013  . GERD (gastroesophageal reflux disease)   . OHYWVPXT(062.6)     Past Surgical History  Procedure Laterality Date  . Right carpal tunnel release    . Left heart catheterization with coronary angiogram N/A 09/03/2013    Procedure: LEFT HEART CATHETERIZATION WITH CORONARY ANGIOGRAM;  Surgeon: Jettie Booze, MD;  Location: [facility] CATH LAB;  Service: Cardiovascular;  Laterality: N/A;    Prior to Admission medications   Medication Sig Start Date End Date Taking? Authorizing Provider  acetaminophen (TYLENOL) 500 MG tablet Take 1,000 mg by mouth every 6 (six) hours as needed for moderate pain.    Yes Historical Provider, MD  atenolol (TENORMIN) 25 MG tablet TAKE ONE TABLET BY MOUTH ONCE DAILY 11/25/14  Yes Alycia Rossetti, MD  pantoprazole (PROTONIX) 40 MG tablet TAKE ONE TABLET BY MOUTH ONCE DAILY 11/25/14  Yes Alycia Rossetti, MD  apixaban (ELIQUIS) 5 MG TABS tablet Take 1 tablet (5 mg total) by mouth 2 (two) times daily. 11/25/14   Alycia Rossetti, MD  atorvastatin (LIPITOR) 20 MG tablet Take 1 tablet (20 mg total) by mouth daily. 12/31/14   Alycia Rossetti, MD  lisinopril (PRINIVIL,ZESTRIL) 10 MG tablet TAKE ONE TABLET BY MOUTH ONCE DAILY 11/25/14   Alycia Rossetti, MD  metFORMIN (GLUCOPHAGE-XR) 500 MG 24 hr tablet Take 1 tablet (500 mg total) by mouth every evening. 11/25/14   Alycia Rossetti, MD  pramoxine (PROCTOFOAM) 1 % foam Place 1 application rectally 3 (three) times daily as needed for itching. 11/25/14   Alycia Rossetti,  MD    Allergies as of 01/08/2015  . (No Known Allergies)    Family History  Problem Relation Age of Onset  . Diabetes Mother   . Hypertension Mother   . Heart disease Mother   . Hypertension Brother   . Colon cancer Neg Hx     Social History   Social History  . Marital Status: Single    Spouse Name: N/A  . Number of Children: N/A  . Years of Education: N/A   Occupational History  . Social worker    Social History Main Topics  . Smoking status: Former Smoker -- 0.30 packs/day    Types: Cigarettes    Quit date: 04/01/2012  . Smokeless tobacco: Never Used  . Alcohol Use: 0.0 oz/week    0 Standard drinks or equivalent per week     Comment: Wine typically 2-3 glasses every few weekends  . Drug Use: No  . Sexual Activity: Not on file   Other Topics Concern  . Not on file   Social History Narrative    Review of Systems: See HPI, otherwise negative ROS   Physical Exam: BP 162/84 mmHg  Pulse 85  Temp(Src) 98.3 F (36.8 C) (Oral)  Resp 14  Ht 5\' 2"  (1.575 m)  Wt 324 lb (146.965 kg)  BMI 59.25 kg/m2  SpO2 96% General:   Alert,  pleasant and cooperative in NAD Head:  Normocephalic and atraumatic.  Neck:  Supple; Lungs:  Clear throughout to auscultation.    Heart:  Regular rate and rhythm. Abdomen:  Soft, nontender and nondistended. Normal bowel sounds, without guarding, and without rebound.   Neurologic:  Alert and  oriented x4;  grossly normal neurologically.  Impression/Plan:   Anemia-NORMOCYTIC > 5 YEARS PLAN:  1. TCS/EGD TODAY

## 2015-02-02 NOTE — Op Note (Signed)
[facility] [address] [place], [address]   COLONOSCOPY PROCEDURE REPORT  PATIENT: Monique, Gomez  MR#: [id number] BIRTHDATE: [birth year] , 36  yrs. old GENDER: female ENDOSCOPIST: Danie Binder, MD REFERRED [id number] Rachel, M.D. PROCEDURE DATE:  Feb 12, 2015 PROCEDURE:   Colonoscopy with cold biopsy polypectomy INDICATIONS:anemia, non-specific: hB 10.7-12 SINCE 2011  AND RECTAL BLEEDING MEDICATIONS: Demerol 100 mg IV and Versed 7 mg IV  DESCRIPTION OF PROCEDURE:    Physical exam was performed.  Informed consent was obtained from the patient after explaining the benefits, risks, and alternatives to procedure.  The patient was connected to monitor and placed in left lateral position. Continuous oxygen was provided by nasal cannula and IV medicine administered through an indwelling cannula.  After administration of sedation and rectal exam, the patients rectum was intubated and the EC-[id number] ([id number])  colonoscope was advanced under direct visualization to the cecum.  The scope was removed slowly by carefully examining the color, texture, anatomy, and integrity mucosa on the way out.  The patient was recovered in endoscopy and discharged home in satisfactory condition. Estimated blood loss is zero unless otherwise noted in this procedure report.    COLON FINDINGS: A sessile polyp measuring 4 mm in size was found in the sigmoid colon.  A polypectomy was performed with cold forceps. , There was moderate diverticulosis noted in the sigmoid colon and descending colon with associated muscular hypertrophy and tortuosity.  , The colon was redundant.  Manual abdominal counter-pressure was used to reach the cecum.  The patient was moved on to their right side to reach the cecum, Small internal hemorrhoids were found.  , and Moderate sized external hemorrhoids were found.  PREP QUALITY: excellent.  CECAL W/D TIME: 9       minutes COMPLICATIONS:  None  ENDOSCOPIC IMPRESSION: 1.   ONE COLON polyp REMOVED-NO SOURCE FOR ANEMIA IDENTIFIED 2.   Moderate diverticulosis noted in the sigmoid colon and descending colon 3.   The LEFT colon IS redundant 4.   RECTAL BLEDING DUE TO Small internal hemorrhoids 5.   Moderate sized external hemorrhoids  RECOMMENDATIONS: Re-start ELIQUIS OCT 7. DRINK WATER TO KEEP URINE LIGHT YELLOW. CONTINUE YOUR WEIGHT LOSS EFFORTS. FOLLOW A HIGH FIBER/LOW FAT DIET. AVOID ITEMS THAT TRIGGER GASTRITIS. PROTONIX 30 MINUTES PRIOR TO BREAKFAST. AWAIT BIOPSY RESULTS. Next colonoscopy in 10 years.  CONSIDER CPAP AND AN OVERTUBE.  eSigned:  Danie Binder, MD 02-12-2015 12:13 PM CPT CODES: ICD CODES:  The ICD and CPT codes recommended by this software are interpretations from the data that the clinical staff has captured with the software.  The verification of the translation of this report to the ICD and CPT codes and modifiers is the sole responsibility of the health care institution and practicing physician where this report was generated.  [employer] will not be held responsible for the validity of the ICD and CPT codes included on this report.  AMA assumes no liability for data contained or not contained herein. CPT is a Designer, television/film set of the [employer].

## 2015-02-02 NOTE — Progress Notes (Signed)
Please excuse Monique Gomez from work today October 3rd, 2016. She may return to work on Wednesday October 5th, 2016.

## 2015-02-02 NOTE — Progress Notes (Signed)
REVIEWED-NO ADDITIONAL RECOMMENDATIONS. 

## 2015-02-04 ENCOUNTER — Telehealth: Payer: Self-pay | Admitting: Gastroenterology

## 2015-02-04 ENCOUNTER — Encounter: Payer: Self-pay | Admitting: Gastroenterology

## 2015-02-04 ENCOUNTER — Encounter (HOSPITAL_COMMUNITY): Payer: Self-pay | Admitting: Gastroenterology

## 2015-02-04 DIAGNOSIS — K219 Gastro-esophageal reflux disease without esophagitis: Secondary | ICD-10-CM

## 2015-02-04 MED ORDER — CLARITHROMYCIN 500 MG PO TABS: ORAL_TABLET | ORAL | Status: DC

## 2015-02-04 MED ORDER — AMOXICILLIN 500 MG PO TABS: ORAL_TABLET | ORAL | Status: DC

## 2015-02-04 NOTE — Telephone Encounter (Signed)
PLEASE CALL PT. Her stomach Bx showed H. Pylori infection AND THIS CAN CONTRIBUTE TO LOW BLOOD COUNT WHILE TAKING A BLOOD THINNER. She needs AMOXICILLIN 500 mg 2 po BID for 10 days and Biaxin 500 mg po bid for 10 days. TAKE PROTONIX BID for 10 days then 1 po dAILY forEVER. DO NOT TAKE LIPITOR WHILE TAKING THE ANTIBIOTICS. Med side effects include NVD, abd pain, and metallic taste.  TAKE ALL THE ABX AS PRESCRIBED. H PYLORI IF LEFT UNTREATED CAN LEAD TO STOMACH CANCER. She had ONE HYPERPLASTIC POLYP. HER IRON STORES ARE LOW NORMAL. SHE DOES NOT NEED IRON SUPPLEMENTS. SHE SHOULD EAT A PALM SIZE SERVING OF MEAT DAILY.   Re-start ELIQUIS OCT 7.  DRINK WATER TO KEEP YOUR URINE LIGHT YELLOW.  CONTINUE YOUR WEIGHT LOSS EFFORTS.   FOLLOW A HIGH FIBER/LOW FAT DIET. AVOID ITEMS THAT CAUSE BLOATING.   AVOID ITEMS THAT TRIGGER GASTRITIS. .  FOLLOW UP IN 4 MOS E30 H PYLORI GASTRITIS, MICROCYTIC ANEMIA.   Next colonoscopy in 10 years.

## 2015-02-04 NOTE — Telephone Encounter (Signed)
APPT MADE AND ON RECALL  °

## 2015-02-05 MED ORDER — PANTOPRAZOLE SODIUM 40 MG PO TBEC: DELAYED_RELEASE_TABLET | ORAL | Status: DC

## 2015-02-05 MED ORDER — APIXABAN 2.5 MG PO TABS: 2.5000 mg | ORAL_TABLET | Freq: Two times a day (BID) | ORAL | Status: DC

## 2015-02-05 NOTE — Telephone Encounter (Addendum)
DISCUSSED CASE WITH [facility]) & DR. MCDOWELL.  PLEASE CALL PT AND [facility]. PT SHOULD TAKE ELIQUIS 2.5 MG DAILY WHILE TAKING ABX FOR H PYLORI. ALTERNATIVE RX SENT FOR ELIQUIS.

## 2015-02-05 NOTE — Telephone Encounter (Signed)
Pt said she needs another prescription for the Protonix.  Also, please note the pharmacist is aware the Eliquis should be 20 tablets not 10 of the 2.5 mg.

## 2015-02-05 NOTE — Telephone Encounter (Signed)
PLEASE CALL PT. rx sent for PROTONIX.

## 2015-02-05 NOTE — Telephone Encounter (Signed)
Pt is aware of results and to take decreased Eliquis while on antibiotics and NO LIPITOR while on antibiotics.

## 2015-02-05 NOTE — Addendum Note (Signed)
Addended by: Danie Binder on: 02/05/2015 12:07 PM   Modules accepted: Orders

## 2015-02-05 NOTE — Addendum Note (Signed)
Addended by: Danie Binder on: 02/05/2015 10:44 AM   Modules accepted: Orders

## 2015-02-05 NOTE — Telephone Encounter (Signed)
Left message that the Rx has been sent in .

## 2015-02-05 NOTE — Telephone Encounter (Signed)
T/C from [place] at [place] [phone]). She said there is a contraindication for the Biaxin and Eloquis to be taken together, it increases the effect of the Eloquis. Please advise!

## 2015-02-09 ENCOUNTER — Ambulatory Visit (INDEPENDENT_AMBULATORY_CARE_PROVIDER_SITE_OTHER): Payer: BLUE CROSS/BLUE SHIELD | Admitting: Orthopedic Surgery

## 2015-02-09 ENCOUNTER — Encounter: Payer: Self-pay | Admitting: Orthopedic Surgery

## 2015-02-09 VITALS — BP 167/101 | Ht 62.0 in | Wt 324.0 lb

## 2015-02-09 DIAGNOSIS — M65341 Trigger finger, right ring finger: Secondary | ICD-10-CM

## 2015-02-09 DIAGNOSIS — M65312 Trigger thumb, left thumb: Secondary | ICD-10-CM | POA: Diagnosis not present

## 2015-02-09 NOTE — Progress Notes (Signed)
Patient ID: Monique Gomez, female   DOB: 04/25/1956, 59 y.o.   MRN: [id number]  Chief Complaint  Patient presents with  . Follow-up    4 week follow up on left thumb after splinting.    The patient did very well with the trigger finger on the right hand but presents now for treatment and injection of the left thumb for triggering popping and tenderness over the A1 pulley  Trigger finger injection  Diagnosis  left thumb triggering Procedure injection A1 pulley Medications lidocaine 1% 1 mL and Depo-Medrol 40 mg 1 mL Skin prep alcohol and ethyl chloride Verbal consent was obtained Timeout confirmed the injection site  After cleaning the skin with alcohol and anesthetizing the skin with ethyl chloride the A1 pulley was palpated and the injection was performed without complication  Follow-up as needed for injection

## 2015-02-27 ENCOUNTER — Ambulatory Visit: Payer: BLUE CROSS/BLUE SHIELD | Admitting: Family Medicine

## 2015-04-21 ENCOUNTER — Telehealth: Payer: Self-pay | Admitting: Orthopedic Surgery

## 2015-04-21 NOTE — Telephone Encounter (Signed)
Patient called to relay that she went to [facility] Urgent care for a new problem - shoulder pain; states was treated with medication and injection, although no Xrays done there. She is requesting an immediate appointment.  Relayed that medical records will be needed prior to scheduling.  Appointment pending.

## 2015-04-24 ENCOUNTER — Other Ambulatory Visit: Payer: Self-pay | Admitting: Family Medicine

## 2015-04-28 NOTE — Telephone Encounter (Signed)
Refill appropriate and filled per protocol. 

## 2015-05-01 NOTE — Telephone Encounter (Signed)
As of 04/23/15, notes received; patient contacted, and appointment scheduled. Patient aware of the appointment.

## 2015-05-14 ENCOUNTER — Ambulatory Visit (INDEPENDENT_AMBULATORY_CARE_PROVIDER_SITE_OTHER): Payer: BLUE CROSS/BLUE SHIELD

## 2015-05-14 ENCOUNTER — Ambulatory Visit (INDEPENDENT_AMBULATORY_CARE_PROVIDER_SITE_OTHER): Payer: BLUE CROSS/BLUE SHIELD | Admitting: Orthopedic Surgery

## 2015-05-14 VITALS — BP 139/87 | Ht 62.0 in | Wt 320.0 lb

## 2015-05-14 DIAGNOSIS — M7552 Bursitis of left shoulder: Secondary | ICD-10-CM

## 2015-05-14 DIAGNOSIS — M25512 Pain in left shoulder: Secondary | ICD-10-CM

## 2015-05-14 NOTE — Progress Notes (Signed)
Patient ID: Monique Gomez, female   DOB: 1955-11-16, 60 y.o.   MRN: [id number]  Chief Complaint  Patient presents with  . Shoulder Pain    Left shoulder pain x 2 months, no known injury    HPI Monique Gomez is a 60 y.o. female.   The patient presents with a two-month history of left shoulder pain with no history of trauma. She complains of a sharp aching pain over the left shoulder radiates occasionally towards the left collarbone and is associated with overhead activity the pain has become constant and it is now 8 out of 10 partially relieved by extra strength Tylenol take 2 tablets. She does have atrial fibrillation and she is on a blood thinner. She did go to urgent care they gave her an IM shot of steroids and advise Percocet 7.5 and Medrol Dosepak which she did not take presents for evaluation and treatment  Review of Systems Review of Systems  Constitutional: Negative.   Musculoskeletal: Positive for neck pain.  Allergic/Immunologic: Positive for environmental allergies.  Neurological: Negative.     Past Medical History  Diagnosis Date  . Allergic rhinitis   . Obesity   . Hyperlipidemia   . Essential hypertension, benign   . History of migraine headaches   . Type 2 diabetes mellitus ([place])   . Anemia   . PAF (paroxysmal atrial fibrillation) ([place]) 08/2013  . GERD (gastroesophageal reflux disease)   . [id number])     Past Surgical History  Procedure Laterality Date  . Right carpal tunnel release    . Left heart catheterization with coronary angiogram N/A 09/03/2013    Procedure: LEFT HEART CATHETERIZATION WITH CORONARY ANGIOGRAM;  Surgeon: Jettie Booze, MD;  Location: [facility] CATH LAB;  Service: Cardiovascular;  Laterality: N/A;  . Colonoscopy N/A 02/02/2015    Procedure: COLONOSCOPY;  Surgeon: Danie Binder, MD;  Location: [facility];  Service: Endoscopy;  Laterality: N/A;  1115 am  . Esophagogastroduodenoscopy N/A 02/02/2015    Procedure:  ESOPHAGOGASTRODUODENOSCOPY (EGD);  Surgeon: Danie Binder, MD;  Location: [facility];  Service: Endoscopy;  Laterality: N/A;    Family History  Problem Relation Age of Onset  . Diabetes Mother   . Hypertension Mother   . Heart disease Mother   . Hypertension Brother   . Colon cancer Neg Hx     Social History Social History  Substance Use Topics  . Smoking status: Former Smoker -- 0.30 packs/day    Types: Cigarettes    Quit date: 04/01/2012  . Smokeless tobacco: Never Used  . Alcohol Use: 0.0 oz/week    0 Standard drinks or equivalent per week     Comment: Wine typically 2-3 glasses every few weekends    No Known Allergies  Current Outpatient Prescriptions  Medication Sig Dispense Refill  . acetaminophen (TYLENOL) 500 MG tablet Take 1,000 mg by mouth every 6 (six) hours as needed for moderate pain.     Marland Kitchen amoxicillin (AMOXIL) 500 MG tablet 2 PO BID FOR 10 DAYS 40 tablet 0  . apixaban (ELIQUIS) 2.5 MG TABS tablet Take 1 tablet (2.5 mg total) by mouth 2 (two) times daily. 20 tablet 0  . atenolol (TENORMIN) 25 MG tablet TAKE ONE TABLET BY MOUTH ONCE DAILY 90 tablet 2  . atorvastatin (LIPITOR) 20 MG tablet Take 1 tablet (20 mg total) by mouth daily. 90 tablet 1  . clarithromycin (BIAXIN) 500 MG tablet 1 PO BID FOR 10 DAYS. 20 tablet 0  .  lisinopril (PRINIVIL,ZESTRIL) 10 MG tablet TAKE ONE TABLET BY MOUTH ONCE DAILY 90 tablet 2  . metFORMIN (GLUCOPHAGE-XR) 500 MG 24 hr tablet Take 1 tablet (500 mg total) by mouth every evening. 90 tablet 2  . pantoprazole (PROTONIX) 40 MG tablet TAKE ONE TABLET BY MOUTH bid 90 tablet 5  . pramoxine (PROCTOFOAM) 1 % foam Place 1 application rectally 3 (three) times daily as needed for itching. 15 g 2  . simvastatin (ZOCOR) 40 MG tablet TAKE ONE TABLET BY MOUTH AT BEDTIME 90 tablet 0   No current facility-administered medications for this visit.       Physical Exam Physical Exam Blood pressure 139/87, height 5\' 2"  (1.575 m), weight 320 lb  (145.151 kg). Appearance, there are no abnormalities in terms of appearance the patient was well-developed and well-nourished. The grooming and hygiene were normal.  Mental status orientation, there was normal alertness and orientation Mood pleasant Ambulatory status normal with no assistive devices  Examination of the  Left shoulder Inspection  Tenderness in the soft tissue surrounding the shoulder Range of motion  Painful elevation 150 Tests for stability  Abduction external rotation negative apprehension Motor strength   5 grade strength in the cuff Skin warm dry and intact without laceration or ulceration or erythema Neurologic examination normal sensation Vascular examination normal pulses with warm extremity and normal capillary refill  The opposite extremity  Normal range of motion    Data Reviewed  plain films done in the office were normal  Assessment   bursitis left shoulder   Plan   inject left subacromial space continue exercise and Tylenol start [place]man exercises follow-up if no improvement

## 2015-06-08 ENCOUNTER — Ambulatory Visit: Payer: BLUE CROSS/BLUE SHIELD | Admitting: Nurse Practitioner

## 2015-06-08 ENCOUNTER — Encounter: Payer: Self-pay | Admitting: Nurse Practitioner

## 2015-06-08 ENCOUNTER — Telehealth: Payer: Self-pay | Admitting: Nurse Practitioner

## 2015-06-08 NOTE — Telephone Encounter (Signed)
PATIENT WAS A NO SHOW AND LETTER SENT  °

## 2015-06-08 NOTE — Telephone Encounter (Signed)
Noted  

## 2015-06-19 ENCOUNTER — Ambulatory Visit: Payer: BC Managed Care – PPO | Admitting: Family Medicine

## 2015-06-26 ENCOUNTER — Other Ambulatory Visit: Payer: Self-pay | Admitting: Family Medicine

## 2015-06-29 ENCOUNTER — Encounter: Payer: Self-pay | Admitting: Family Medicine

## 2015-06-29 NOTE — Telephone Encounter (Signed)
Medication refill for one time only.  Patient needs to be seen.  Letter sent for patient to call and schedule 

## 2015-08-19 ENCOUNTER — Ambulatory Visit: Payer: BLUE CROSS/BLUE SHIELD | Admitting: Physician Assistant

## 2015-08-20 ENCOUNTER — Encounter: Payer: Self-pay | Admitting: Physician Assistant

## 2015-08-20 ENCOUNTER — Encounter: Payer: Self-pay | Admitting: Family Medicine

## 2015-08-20 ENCOUNTER — Ambulatory Visit (INDEPENDENT_AMBULATORY_CARE_PROVIDER_SITE_OTHER): Payer: 59 | Admitting: Physician Assistant

## 2015-08-20 VITALS — BP 122/80 | HR 64 | Temp 98.0°F | Resp 18 | Wt 321.0 lb

## 2015-08-20 DIAGNOSIS — J069 Acute upper respiratory infection, unspecified: Secondary | ICD-10-CM

## 2015-08-20 DIAGNOSIS — I10 Essential (primary) hypertension: Secondary | ICD-10-CM | POA: Diagnosis not present

## 2015-08-20 DIAGNOSIS — E785 Hyperlipidemia, unspecified: Secondary | ICD-10-CM | POA: Diagnosis not present

## 2015-08-20 DIAGNOSIS — Z0000 Encounter for general adult medical examination without abnormal findings: Secondary | ICD-10-CM | POA: Diagnosis not present

## 2015-08-20 DIAGNOSIS — E119 Type 2 diabetes mellitus without complications: Secondary | ICD-10-CM | POA: Diagnosis not present

## 2015-08-20 DIAGNOSIS — B9689 Other specified bacterial agents as the cause of diseases classified elsewhere: Secondary | ICD-10-CM

## 2015-08-20 DIAGNOSIS — Z79899 Other long term (current) drug therapy: Secondary | ICD-10-CM | POA: Diagnosis not present

## 2015-08-20 LAB — CBC WITH DIFFERENTIAL/PLATELET
BASOS ABS: 0 {cells}/uL (ref 0–200)
BASOS PCT: 0 %
EOS ABS: 148 {cells}/uL (ref 15–500)
Eosinophils Relative: 2 %
HEMATOCRIT: 35.3 % (ref 35.0–45.0)
Hemoglobin: 10.7 g/dL — ABNORMAL LOW (ref 12.0–15.0)
LYMPHS PCT: 22 %
Lymphs Abs: 1628 cells/uL (ref 850–3900)
MCH: 20.2 pg — AB (ref 27.0–33.0)
MCHC: 30.3 g/dL — ABNORMAL LOW (ref 32.0–36.0)
MCV: 66.7 fL — AB (ref 80.0–100.0)
MONO ABS: 666 {cells}/uL (ref 200–950)
MPV: 9.4 fL (ref 7.5–12.5)
Monocytes Relative: 9 %
NEUTROS ABS: 4958 {cells}/uL (ref 1500–7800)
Neutrophils Relative %: 67 %
Platelets: 313 10*3/uL (ref 140–400)
RBC: 5.29 MIL/uL — ABNORMAL HIGH (ref 3.80–5.10)
RDW: 16.8 % — ABNORMAL HIGH (ref 11.0–15.0)
WBC: 7.4 10*3/uL (ref 3.8–10.8)

## 2015-08-20 LAB — COMPLETE METABOLIC PANEL WITH GFR
ALBUMIN: 3.6 g/dL (ref 3.6–5.1)
ALK PHOS: 64 U/L (ref 33–130)
ALT: 9 U/L (ref 6–29)
AST: 11 U/L (ref 10–35)
BUN: 14 mg/dL (ref 7–25)
CALCIUM: 9.2 mg/dL (ref 8.6–10.4)
CHLORIDE: 106 mmol/L (ref 98–110)
CO2: 26 mmol/L (ref 20–31)
Creat: 0.71 mg/dL (ref 0.50–1.05)
GFR, Est Non African American: >89 mL/min (ref >=60)
Glucose, Bld: 102 mg/dL — ABNORMAL HIGH (ref 70–99)
POTASSIUM: 3.7 mmol/L (ref 3.5–5.3)
Sodium: 142 mmol/L (ref 135–146)
Total Bilirubin: 0.2 mg/dL (ref 0.2–1.2)
Total Protein: 6.6 g/dL (ref 6.1–8.1)

## 2015-08-20 LAB — LIPID PANEL
CHOL/HDL RATIO: 3.9 ratio (ref <=5.0)
CHOLESTEROL: 142 mg/dL (ref 125–200)
HDL: 36 mg/dL — ABNORMAL LOW (ref >=46)
LDL Cholesterol: 93 mg/dL (ref <130)
TRIGLYCERIDES: 65 mg/dL (ref <150)
VLDL: 13 mg/dL (ref <30)

## 2015-08-20 LAB — HEMOGLOBIN A1C
Hgb A1c MFr Bld: 6.7 % — ABNORMAL HIGH (ref <5.7)
Mean Plasma Glucose: 146 mg/dL

## 2015-08-20 MED ORDER — AMOXICILLIN-POT CLAVULANATE 875-125 MG PO TABS
1.0000 | ORAL_TABLET | Freq: Two times a day (BID) | ORAL | Status: DC
Start: 2015-08-20 — End: 2015-11-26

## 2015-08-20 NOTE — Progress Notes (Signed)
Patient ID: Monique Gomez MRN: [id number], DOB: [birth year], 60 y.o. Date of Encounter: 08/20/2015, 3:09 PM    Chief Complaint:  Chief Complaint  Patient presents with  . sick on/off 3 wks    last 2 days body aches, fever, congestion, ears hurt, sinus infection     HPI: 60 y.o. year old AA female presents with above.   Says that she has not seen any medical provider and has not been on any prescription medications for this illness. Says that she has been sick for about 2 or 3 weeks. Says it is all in her head her throat and her ears. Does not seem to be down in her chest. Last couple of days has had some low-grade fever.     Home Meds:   Outpatient Prescriptions Prior to Visit  Medication Sig Dispense Refill  . acetaminophen (TYLENOL) 500 MG tablet Take 1,000 mg by mouth every 6 (six) hours as needed for moderate pain.     Marland Kitchen apixaban (ELIQUIS) 2.5 MG TABS tablet Take 1 tablet (2.5 mg total) by mouth 2 (two) times daily. 20 tablet 0  . atenolol (TENORMIN) 25 MG tablet TAKE ONE TABLET BY MOUTH ONCE DAILY 90 tablet 2  . atorvastatin (LIPITOR) 20 MG tablet Take 1 tablet (20 mg total) by mouth daily. 90 tablet 1  . lisinopril (PRINIVIL,ZESTRIL) 10 MG tablet TAKE ONE TABLET BY MOUTH ONCE DAILY 90 tablet 2  . metFORMIN (GLUCOPHAGE-XR) 500 MG 24 hr tablet Take 1 tablet (500 mg total) by mouth every evening. 90 tablet 2  . pantoprazole (PROTONIX) 40 MG tablet TAKE ONE TABLET BY MOUTH bid 90 tablet 5  . pramoxine (PROCTOFOAM) 1 % foam Place 1 application rectally 3 (three) times daily as needed for itching. 15 g 2  . simvastatin (ZOCOR) 40 MG tablet TAKE ONE TABLET BY MOUTH AT BEDTIME 30 tablet 0  . amoxicillin (AMOXIL) 500 MG tablet 2 PO BID FOR 10 DAYS 40 tablet 0   No facility-administered medications prior to visit.    Allergies: No Known Allergies    Review of Systems: See HPI for pertinent ROS. All other ROS negative.    Physical Exam: Blood pressure 122/80, pulse 64,  temperature 98 F (36.7 C), temperature source Oral, resp. rate 18, weight 321 lb (145.605 kg)., Body mass index is 58.7 kg/(m^2). General:  Obese AAF. Appears in no acute distress. HEENT: Normocephalic, atraumatic, eyes without discharge, sclera non-icteric, nares are without discharge. Bilateral auditory canals clear, TM's are without perforation, pearly grey and translucent with reflective cone of light bilaterally. Oral cavity moist, posterior pharynx without exudate, erythema, peritonsillar abscess. No tenderness with percussion to frontal or maxillary sinuses bilaterally.  Neck: Supple. No thyromegaly. No lymphadenopathy. Lungs: Clear bilaterally to auscultation without wheezes, rales, or rhonchi. Breathing is unlabored. Heart: Regular rhythm. No murmurs, rubs, or gallops. Msk:  Strength and tone normal for age. Extremities/Skin: Warm and dry. Neuro: Alert and oriented X 3. Moves all extremities spontaneously. Gait is normal. CNII-XII grossly in tact. Psych:  Responds to questions appropriately with a normal affect.     ASSESSMENT AND PLAN:  60 y.o. year old female with  1. Bacterial upper respiratory infection She is to take antibiotic as directed and complete all of it. Can use over-the-counter medications for symptom relief in the interim as well. Follow-up if symptoms do not resolve up in completion of antibiotic. Note given to be out of work today and tomorrow and return to work Monday. -  amoxicillin-clavulanate (AUGMENTIN) 875-125 MG tablet; Take 1 tablet by mouth 2 (two) times daily.  Dispense: 20 tablet; Refill: 0  She states that she has a complete physical exam scheduled here April 26. Says that appointment is later in the day. She is fasting today and wants to go ahead and obtain labs in preparation for that visit. Lab order was entered today by office staff-- and labs obtained today.  Marin Olp [place], [place], [facility] 08/20/2015 3:09 PM

## 2015-08-25 ENCOUNTER — Encounter: Payer: Self-pay | Admitting: Family Medicine

## 2015-08-26 ENCOUNTER — Encounter: Payer: Self-pay | Admitting: Family Medicine

## 2015-08-26 ENCOUNTER — Ambulatory Visit (INDEPENDENT_AMBULATORY_CARE_PROVIDER_SITE_OTHER): Payer: 59 | Admitting: Family Medicine

## 2015-08-26 VITALS — BP 138/80 | HR 86 | Temp 98.9°F | Resp 14 | Ht 62.0 in | Wt 313.0 lb

## 2015-08-26 DIAGNOSIS — Z0000 Encounter for general adult medical examination without abnormal findings: Secondary | ICD-10-CM

## 2015-08-26 DIAGNOSIS — B379 Candidiasis, unspecified: Secondary | ICD-10-CM | POA: Diagnosis not present

## 2015-08-26 DIAGNOSIS — E785 Hyperlipidemia, unspecified: Secondary | ICD-10-CM

## 2015-08-26 DIAGNOSIS — K219 Gastro-esophageal reflux disease without esophagitis: Secondary | ICD-10-CM | POA: Diagnosis not present

## 2015-08-26 DIAGNOSIS — I10 Essential (primary) hypertension: Secondary | ICD-10-CM

## 2015-08-26 DIAGNOSIS — E119 Type 2 diabetes mellitus without complications: Secondary | ICD-10-CM | POA: Diagnosis not present

## 2015-08-26 MED ORDER — PANTOPRAZOLE SODIUM 40 MG PO TBEC: DELAYED_RELEASE_TABLET | ORAL | Status: DC

## 2015-08-26 MED ORDER — LEVOCETIRIZINE DIHYDROCHLORIDE 5 MG PO TABS: 5.0000 mg | ORAL_TABLET | Freq: Every evening | ORAL | Status: DC

## 2015-08-26 MED ORDER — FUSION PLUS PO CAPS
ORAL_CAPSULE | ORAL | Status: DC
Start: 2015-08-26 — End: 2017-02-23

## 2015-08-26 MED ORDER — ATORVASTATIN CALCIUM 20 MG PO TABS: 20.0000 mg | ORAL_TABLET | Freq: Every day | ORAL | Status: DC

## 2015-08-26 MED ORDER — FLUCONAZOLE 150 MG PO TABS: ORAL_TABLET | ORAL | Status: DC

## 2015-08-26 NOTE — Patient Instructions (Addendum)
Schedule with GYN for mammogram Schedule with your cardiologist Take the diflucan, your labs looks great! Slow release iron for your anemia  F/U 4 months

## 2015-08-26 NOTE — Progress Notes (Signed)
Patient ID: Monique Gomez, female   DOB: Sep 23, 1955, 60 y.o.   MRN: [id number]    Subjective:    Patient ID: Monique Gomez, female    DOB: 1955/06/01, 60 y.o.   MRN: [id number]  Patient presents for CPE and Vaginal irritation  Pt here for CPE and fasting labs reviewed at bedisde   She has GYN for mammogram and PAP Smear  She is completing treatment for respiratory infection, now has yeast infection, currently on antibiotics. No new concerns.Note she did lose her insurance for a couple of months with her job transition , now back on all of her medications. She is using Herbal Life shakes and home exercise videos  History of CAD, due for F/U Cardiology DM- last A1C 6.7% Due for PAP Smear  Colonoscopy UTD Mammogram overdue      Review Of Systems:  GEN- denies fatigue, fever, weight loss,weakness, recent illness HEENT- denies eye drainage, change in vision, nasal discharge, CVS- denies chest pain, palpitations RESP- denies SOB,+ cough, wheeze ABD- denies N/V, change in stools, abd pain GU- denies dysuria, hematuria, dribbling, incontinence MSK- denies joint pain, muscle aches, injury Neuro- denies headache, dizziness, syncope, seizure activity       Objective:    BP 138/80 mmHg  Pulse 86  Temp(Src) 98.9 F (37.2 C) (Oral)  Resp 14  Ht 5\' 2"  (1.575 m)  Wt 313 lb (141.976 kg)  BMI 57.23 kg/m2 GEN- NAD, alert and oriented x3 HEENT- PERRL, EOMI, non injected sclera, pink conjunctiva, MMM, oropharynx clear Neck- Supple, no thyromegaly CVS- RRR, soft systolic murmur  RESP-CTAB ABD-NABS,soft,NT,ND GU- declined  EXT- No edema Pulses- Radial, DP- 2+        Assessment & Plan:      Problem List Items Addressed This Visit    Morbid obesity (HCC)    Weight down 18lbs in 1 month Continue with healthy eating and exercise plan       Hyperlipidemia   Relevant Medications   atorvastatin (LIPITOR) 20 MG tablet   Essential hypertension, benign - Primary    BP okay  no change to meds      Relevant Medications   atorvastatin (LIPITOR) 20 MG tablet   Diabetes mellitus (HCC)    Well controlled, no change to meds      Relevant Medications   atorvastatin (LIPITOR) 20 MG tablet    Other Visit Diagnoses    Routine general medical examination at a health care facility        CPE done, labs overall look good, f/u GYN and cardiology    Gastroesophageal reflux disease without esophagitis        Relevant Medications    pantoprazole (PROTONIX) 40 MG tablet    Yeast infection        Diflucan given, pt on antibiotics and DM    Relevant Medications    fluconazole (DIFLUCAN) 150 MG tablet       Note: This dictation was prepared with Dragon dictation along with smaller phrase technology. Any transcriptional errors that result from this process are unintentional.

## 2015-08-27 ENCOUNTER — Encounter: Payer: Self-pay | Admitting: Family Medicine

## 2015-08-27 NOTE — Assessment & Plan Note (Signed)
BP okay no change to meds

## 2015-08-27 NOTE — Assessment & Plan Note (Signed)
Well controlled, no change to meds 

## 2015-08-27 NOTE — Assessment & Plan Note (Signed)
Weight down 18lbs in 1 month Continue with healthy eating and exercise plan

## 2015-09-17 ENCOUNTER — Other Ambulatory Visit: Payer: Self-pay | Admitting: Family Medicine

## 2015-09-18 NOTE — Telephone Encounter (Signed)
Refill appropriate and filled per protocol. 

## 2015-10-12 ENCOUNTER — Ambulatory Visit (INDEPENDENT_AMBULATORY_CARE_PROVIDER_SITE_OTHER): Payer: 59 | Admitting: Cardiology

## 2015-10-12 ENCOUNTER — Encounter: Payer: Self-pay | Admitting: Cardiology

## 2015-10-12 VITALS — BP 128/92 | HR 95 | Ht 62.0 in | Wt 313.0 lb

## 2015-10-12 DIAGNOSIS — E785 Hyperlipidemia, unspecified: Secondary | ICD-10-CM

## 2015-10-12 DIAGNOSIS — I480 Paroxysmal atrial fibrillation: Secondary | ICD-10-CM

## 2015-10-12 DIAGNOSIS — I10 Essential (primary) hypertension: Secondary | ICD-10-CM | POA: Diagnosis not present

## 2015-10-12 MED ORDER — APIXABAN 5 MG PO TABS
5.0000 mg | ORAL_TABLET | Freq: Two times a day (BID) | ORAL | Status: DC
Start: 2015-10-12 — End: 2016-03-29

## 2015-10-12 NOTE — Progress Notes (Signed)
Cardiology Office Note  Date: 10/12/2015   ID: Monique Gomez, DOB 09-04-1955, MRN [id number]  PCP: Vic Blackbird, MD  Primary Cardiologist: Rozann Lesches, MD   Chief Complaint  Patient presents with  . Atrial fibrillation    History of Present Illness: Monique Gomez is a 60 y.o. female last seen in September 2016. She presents for a routine follow-up visit. No significant palpitations or chest pain since last assessment. She has been trying to exercise and watch her diet, has lost about 12 pounds since last visit.  She continues to follow with Dr. Buelah Manis, had a wellness exam back in April. She is on Eliquis 2.5 mg twice daily. Effective dose for her would be 5 mg twice daily. I did review her recent lab work, creatinine normal. Looks like the dose was changed by Dr. Oneida Alar with a prior refill. She does not report any significant bleeding problems. Last hemoglobin was 10.7.  I reviewed her remaining medications which are outlined below.  Past Medical History  Diagnosis Date  . Allergic rhinitis   . Obesity   . Hyperlipidemia   . Essential hypertension, benign   . History of migraine headaches   . Type 2 diabetes mellitus ([place])   . Anemia   . PAF (paroxysmal atrial fibrillation) ([place]) 08/2013  . GERD (gastroesophageal reflux disease)   . Headache(784.0)   . History of cardiac catheterization     No significant CAD May 2015    Past Surgical History  Procedure Laterality Date  . Right carpal tunnel release    . Left heart catheterization with coronary angiogram N/A 09/03/2013    Procedure: LEFT HEART CATHETERIZATION WITH CORONARY ANGIOGRAM;  Surgeon: Jettie Booze, MD;  Location: [facility] CATH LAB;  Service: Cardiovascular;  Laterality: N/A;  . Colonoscopy N/A 02/02/2015    SLF: 1. one colon polyp removed-no source for anemia identified. 2. moderate diverticulosis noted in the sigmoid colon and descending colon 3. the left colon is redundant 4. Rectal bleeding due ot  small internal hemorroids 5. Moderate sized external hemorrhoids.  . Esophagogastroduodenoscopy N/A 02/02/2015    SLF: 1. 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 large hiatal hernia 3. mild non-erosive gastritis 4. No source for anemia identified.     Current Outpatient Prescriptions  Medication Sig Dispense Refill  . acetaminophen (TYLENOL) 500 MG tablet Take 1,000 mg by mouth every 6 (six) hours as needed for moderate pain.     Marland Kitchen amoxicillin-clavulanate (AUGMENTIN) 875-125 MG tablet Take 1 tablet by mouth 2 (two) times daily. 20 tablet 0  . apixaban (ELIQUIS) 2.5 MG TABS tablet Take 1 tablet (2.5 mg total) by mouth 2 (two) times daily. 20 tablet 0  . atenolol (TENORMIN) 25 MG tablet TAKE ONE TABLET BY MOUTH ONCE DAILY 90 tablet 1  . atorvastatin (LIPITOR) 20 MG tablet Take 1 tablet (20 mg total) by mouth daily. 90 tablet 1  . fluconazole (DIFLUCAN) 150 MG tablet Take 1 tablet x 1 and repeat in 3 days 2 tablet 0  . Iron-FA-B Cmp-C-Biot-Probiotic (FUSION PLUS) CAPS 1 capsule daily 30 capsule 6  . levocetirizine (XYZAL) 5 MG tablet Take 1 tablet (5 mg total) by mouth every evening. 30 tablet 6  . lisinopril (PRINIVIL,ZESTRIL) 10 MG tablet TAKE ONE TABLET BY MOUTH ONCE DAILY 90 tablet 2  . metFORMIN (GLUCOPHAGE-XR) 500 MG 24 hr tablet Take 1 tablet (500 mg total) by mouth every evening. 90 tablet 2  . pantoprazole (PROTONIX) 40 MG tablet TAKE ONE  TABLET BY MOUTH bid 90 tablet 5  . pramoxine (PROCTOFOAM) 1 % foam Place 1 application rectally 3 (three) times daily as needed for itching. 15 g 2   No current facility-administered medications for this visit.   Allergies:  Review of patient's allergies indicates no known allergies.   Social History: The patient  reports that she quit smoking about 3 years ago. Her smoking use included Cigarettes. She smoked 0.30 packs per day. She has never used smokeless tobacco. She reports that she drinks alcohol. She reports that she does not use  illicit drugs.   ROS:  Please see the history of present illness. Otherwise, complete review of systems is positive for URIs over the winter.  All other systems are reviewed and negative.   Physical Exam: VS:  BP 128/92 mmHg  Pulse 95  Ht 5\' 2"  (1.575 m)  Wt 313 lb (141.976 kg)  BMI 57.23 kg/m2  SpO2 99%, BMI Body mass index is 57.23 kg/(m^2).  Wt Readings from Last 3 Encounters:  10/12/15 313 lb (141.976 kg)  08/26/15 313 lb (141.976 kg)  08/20/15 321 lb (145.605 kg)    General: Obese woman, appears comfortable at rest. HEENT: Conjunctiva and lids normal, oropharynx clear. Neck: Supple, no elevated JVP or carotid bruits, no thyromegaly. Lungs: Clear to auscultation, nonlabored breathing at rest. Cardiac: Regular rate and rhythm, no S3 or significant systolic murmur, no pericardial rub. Abdomen: Soft, nontender, bowel sounds present, no guarding or rebound. Extremities: No pitting edema, distal pulses 2+. Skin: Warm and dry. Musculoskeletal: No kyphosis. Neuropsychiatric: Alert and oriented x3, affect grossly appropriate.  ECG: I personally reviewed the tracing from 01/07/2015 which showed sinus rhythm with diffuse nonspecific ST-T wave changes.  Recent Labwork: 11/25/2014: TSH 2.687 08/20/2015: ALT 9; AST 11; BUN 14; Creat 0.71; Hemoglobin 10.7*; Platelets 313; Potassium 3.7; Sodium 142     Component Value Date/Time   CHOL 142 08/20/2015 0935   TRIG 65 08/20/2015 0935   HDL 36* 08/20/2015 0935   CHOLHDL 3.9 08/20/2015 0935   VLDL 13 08/20/2015 0935   LDLCALC 93 08/20/2015 0935    Other Studies Reviewed Today:  Echocardiogram 09/02/2013: Study Conclusions  - Left ventricle: The cavity size was normal. Wall thickness was increased in a pattern of mild to moderateLVH. Systolic function was normal. The estimated ejection fraction was in the range of 60% to 65%. Wall motion was normal; there were no regional wall motion abnormalities. The study is not technically  sufficient to allow evaluation of LV diastolic function. - Aortic valve: Mildly calcified annulus. Trileaflet. No significant regurgitation. - Mitral valve: Calcified annulus. Mild regurgitation. - Left atrium: The atrium was at the upper limits of normal in size. - Right ventricle: The cavity size was mildly dilated. - Right atrium: Central venous pressure: 79mm Hg (est). - Atrial septum: No defect or patent foramen ovale was identified. - Tricuspid valve: Mild regurgitation. - Pulmonary arteries: PA peak pressure: 59mm Hg (S). - Pericardium, extracardiac: There was no pericardial effusion. Impressions:  - Mild to moderate LVH with LVEF 60-65%. Indeterminate diastolic function. Upper normal left atrial size. MAC with mild mitral regurgitation. Mild RV enlargement. Mild tricuspid regurgitation with PASP 33 mmHg.  Cardiac catheterization 09/03/2013: HEMODYNAMICS: Aortic pressure was 149/93; LV pressure was 146/6; LVEDP 15. There was no gradient between the left ventricle and aorta.   ANGIOGRAPHIC DATA: The left main coronary artery is widely patent.  The left anterior descending artery is a large vessel which reaches the apex. There is  a couple of small diagonals which are patent. There is one large diagonal which is also widely patent.  The left circumflex artery is a large vessel. There is a small ramus branch which is patent. The first obtuse marginal and second obtuse marginal are medium to large size vessels which are widely patent the remainder of the circumflex is patent the entire circumflex system is angiographically normal..  The right coronary artery is a large dominant vessel. There is a small area of ectasia in the proximal and. There is no significant atherosclerosis. Posterior lateral artery is patent. The posterior descending artery is patent.  LEFT VENTRICULOGRAM: Left ventricular angiogram was done in the 30 RAO projection and revealed normal left  ventricular wall motion and systolic function with an estimated ejection fraction of 60 %. LVEDP was 16 mmHg.  Assessment and Plan:  1. Paroxysmal atrial fibrillation, symptomatically well controlled at this time. Eliquis dose will be changed to 5 mg twice a day, no specific indication for her to be on lower dose at this time. Continue to follow, should have repeat BMET and CBC in approximately 6 months.  2. Essential hypertension, no changes made present regimen which includes atenolol and lisinopril.  3. History of cardiac catheterization in 2015 demonstrating no significant obstructive CAD. He does not report any angina symptoms.  4. Hyperlipidemia, on Lipitor. Last LDL was 93.  5. Morbid obesity. I encouraged her to continue with strategy of diet and exercise. She is trying to lose more weight.  Current medicines were reviewed with the patient today.  Disposition: FU with me in 6 months.   Signed, Satira Sark, MD, [facility] 10/12/2015 7:57 AM    [place] [facility] [address], [place], [place] [address] Phone: [phone]; Fax: [phone]

## 2015-10-12 NOTE — Patient Instructions (Signed)
Your physician wants you to follow-up in: 6 months You will receive a reminder letter in the mail two months in advance. If you don't receive a letter, please call our office to schedule the follow-up appointment.  INCREASE Eliquis to 5 mg twice a day   If you need a refill on your cardiac medications before your next appointment, please call your pharmacy.     Thank you for choosing [facility] !

## 2015-11-26 ENCOUNTER — Ambulatory Visit (INDEPENDENT_AMBULATORY_CARE_PROVIDER_SITE_OTHER): Payer: 59 | Admitting: Obstetrics & Gynecology

## 2015-11-26 ENCOUNTER — Other Ambulatory Visit (HOSPITAL_COMMUNITY)
Admission: RE | Admit: 2015-11-26 | Discharge: 2015-11-26 | Disposition: A | Discharge status: Home / Self Care | Payer: 59 | Source: Ambulatory Visit | Attending: Obstetrics & Gynecology | Admitting: Obstetrics & Gynecology

## 2015-11-26 ENCOUNTER — Encounter: Payer: Self-pay | Admitting: Obstetrics & Gynecology

## 2015-11-26 VITALS — BP 120/80 | HR 72 | Ht 62.0 in | Wt 314.0 lb

## 2015-11-26 DIAGNOSIS — Z1211 Encounter for screening for malignant neoplasm of colon: Secondary | ICD-10-CM | POA: Diagnosis not present

## 2015-11-26 DIAGNOSIS — Z1151 Encounter for screening for human papillomavirus (HPV): Secondary | ICD-10-CM | POA: Diagnosis present

## 2015-11-26 DIAGNOSIS — Z01419 Encounter for gynecological examination (general) (routine) without abnormal findings: Secondary | ICD-10-CM | POA: Insufficient documentation

## 2015-11-26 DIAGNOSIS — Z1212 Encounter for screening for malignant neoplasm of rectum: Secondary | ICD-10-CM

## 2015-11-26 NOTE — Progress Notes (Signed)
Subjective:     Monique Gomez is a 60 y.o. female here for a routine exam.  No LMP recorded. Patient is not currently having periods (Reason: Perimenopausal). No obstetric history on file. Birth Control Method:  none Menstrual Calendar(currently): post menopaual  Current complaints: negative.   Current acute medical issues:  HTN, diabetes obesity   Recent Gynecologic History No LMP recorded. Patient is not currently having periods (Reason: Perimenopausal). Last Pap: 2013,  normal Last mammogram: 2016,  normal  Past Medical History:  Diagnosis Date  . Allergic rhinitis   . Anemia   . Essential hypertension, benign   . GERD (gastroesophageal reflux disease)   . Headache(784.0)   . History of cardiac catheterization    No significant CAD May 2015  . History of migraine headaches   . Hyperlipidemia   . Obesity   . PAF (paroxysmal atrial fibrillation) ([place]) 08/2013  . Type 2 diabetes mellitus ([place])     Past Surgical History:  Procedure Laterality Date  . COLONOSCOPY N/A 02/02/2015   SLF: 1. one colon polyp removed-no source for anemia identified. 2. moderate diverticulosis noted in the sigmoid colon and descending colon 3. the left colon is redundant 4. Rectal bleeding due ot small internal hemorroids 5. Moderate sized external hemorrhoids.  . ESOPHAGOGASTRODUODENOSCOPY N/A 02/02/2015   SLF: 1. 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 large hiatal hernia 3. mild non-erosive gastritis 4. No source for anemia identified.   Marland Kitchen LEFT HEART CATHETERIZATION WITH CORONARY ANGIOGRAM N/A 09/03/2013   Procedure: LEFT HEART CATHETERIZATION WITH CORONARY ANGIOGRAM;  Surgeon: Jettie Booze, MD;  Location: [facility] CATH LAB;  Service: Cardiovascular;  Laterality: N/A;  . Right carpal tunnel release      OB History    No data available      Social History   Social History  . Marital status: Single    Spouse name: N/A  . Number of children: N/A  . Years of education: N/A    Occupational History  . Social worker [employer]   Social History Main Topics  . Smoking status: Former Smoker    Packs/day: 0.30    Types: Cigarettes    Quit date: 04/01/2012  . Smokeless tobacco: Never Used  . Alcohol use 0.0 oz/week     Comment: Wine typically 2-3 glasses every few weekends  . Drug use: No  . Sexual activity: Yes   Other Topics Concern  . None   Social History Narrative  . None    Family History  Problem Relation Age of Onset  . Diabetes Mother   . Hypertension Mother   . Heart disease Mother   . Hypertension Brother   . Colon cancer Neg Hx      Current Outpatient Prescriptions:  .  apixaban (ELIQUIS) 5 MG TABS tablet, Take 1 tablet (5 mg total) by mouth 2 (two) times daily., Disp: 60 tablet, Rfl: 11 .  atenolol (TENORMIN) 25 MG tablet, TAKE ONE TABLET BY MOUTH ONCE DAILY, Disp: 90 tablet, Rfl: 1 .  atorvastatin (LIPITOR) 20 MG tablet, Take 1 tablet (20 mg total) by mouth daily., Disp: 90 tablet, Rfl: 1 .  Iron-FA-B Cmp-C-Biot-Probiotic (FUSION PLUS) CAPS, 1 capsule daily, Disp: 30 capsule, Rfl: 6 .  lisinopril (PRINIVIL,ZESTRIL) 10 MG tablet, TAKE ONE TABLET BY MOUTH ONCE DAILY, Disp: 90 tablet, Rfl: 2 .  metFORMIN (GLUCOPHAGE-XR) 500 MG 24 hr tablet, Take 1 tablet (500 mg total) by mouth every evening., Disp: 90 tablet, Rfl: 2 .  pantoprazole (PROTONIX) 40 MG tablet, TAKE ONE TABLET BY MOUTH bid, Disp: 90 tablet, Rfl: 5 .  acetaminophen (TYLENOL) 500 MG tablet, Take 1,000 mg by mouth every 6 (six) hours as needed for moderate pain. , Disp: , Rfl:   Review of Systems  Review of Systems  Constitutional: Negative for fever, chills, weight loss, malaise/fatigue and diaphoresis.  HENT: Negative for hearing loss, ear pain, nosebleeds, congestion, sore throat, neck pain, tinnitus and ear discharge.   Eyes: Negative for blurred vision, double vision, photophobia, pain, discharge and redness.  Respiratory: Negative for cough, hemoptysis, sputum  production, shortness of breath, wheezing and stridor.   Cardiovascular: Negative for chest pain, palpitations, orthopnea, claudication, leg swelling and PND.  Gastrointestinal: negative for abdominal pain. Negative for heartburn, nausea, vomiting, diarrhea, constipation, blood in stool and melena.  Genitourinary: Negative for dysuria, urgency, frequency, hematuria and flank pain.  Musculoskeletal: Negative for myalgias, back pain, joint pain and falls.  Skin: Negative for itching and rash.  Neurological: Negative for dizziness, tingling, tremors, sensory change, speech change, focal weakness, seizures, loss of consciousness, weakness and headaches.  Endo/Heme/Allergies: Negative for environmental allergies and polydipsia. Does not bruise/bleed easily.  Psychiatric/Behavioral: Negative for depression, suicidal ideas, hallucinations, memory loss and substance abuse. The patient is not nervous/anxious and does not have insomnia.        Objective:  Blood pressure 120/80, pulse 72, height 5\' 2"  (1.575 m), weight (!) 314 lb (142.4 kg).   Physical Exam  Vitals reviewed. Constitutional: She is oriented to person, place, and time. She appears well-developed and well-nourished.  HENT:  Head: Normocephalic and atraumatic.        Right Ear: External ear normal.  Left Ear: External ear normal.  Nose: Nose normal.  Mouth/Throat: Oropharynx is clear and moist.  Eyes: Conjunctivae and EOM are normal. Pupils are equal, round, and reactive to light. Right eye exhibits no discharge. Left eye exhibits no discharge. No scleral icterus.  Neck: Normal range of motion. Neck supple. No tracheal deviation present. No thyromegaly present.  Cardiovascular: Normal rate, regular rhythm, normal heart sounds and intact distal pulses.  Exam reveals no gallop and no friction rub.   No murmur heard. Respiratory: Effort normal and breath sounds normal. No respiratory distress. She has no wheezes. She has no rales. She  exhibits no tenderness.  GI: Soft. Bowel sounds are normal. She exhibits no distension and no mass. There is no tenderness. There is no rebound and no guarding.  Genitourinary:  Breasts no masses skin changes or nipple changes bilaterally      Vulva is normal without lesions Vagina is pink moist without discharge Cervix normal in appearance and pap is done Uterus is normal size shape and contour Adnexa is negative with normal sized ovaries  {Rectal    hemoccult negative, normal tone, no masses  Musculoskeletal: Normal range of motion. She exhibits no edema and no tenderness.  Neurological: She is alert and oriented to person, place, and time. She has normal reflexes. She displays normal reflexes. No cranial nerve deficit. She exhibits normal muscle tone. Coordination normal.  Skin: Skin is warm and dry. No rash noted. No erythema. No pallor.  Psychiatric: She has a normal mood and affect. Her behavior is normal. Judgment and thought content normal.       Medications Ordered at today's visit: No orders of the defined types were placed in this encounter.   Other orders placed at today's visit: No orders of the defined types were placed in  this encounter.     Assessment:    Healthy female exam.    Plan:    Hormone replacement therapy: not applicable. Mammogram ordered. Follow up in: 2 years.     Return in about 2 years (around 11/25/2017) for yearly, with Dr Elonda Husky.

## 2015-11-27 LAB — CYTOLOGY - PAP

## 2015-11-30 ENCOUNTER — Other Ambulatory Visit: Payer: Self-pay | Admitting: Family Medicine

## 2015-11-30 NOTE — Telephone Encounter (Signed)
Refill appropriate and filled per protocol. 

## 2015-12-28 ENCOUNTER — Other Ambulatory Visit: Payer: Self-pay | Admitting: Family Medicine

## 2015-12-29 ENCOUNTER — Other Ambulatory Visit: Payer: Self-pay | Admitting: Obstetrics & Gynecology

## 2015-12-29 DIAGNOSIS — Z1231 Encounter for screening mammogram for malignant neoplasm of breast: Secondary | ICD-10-CM

## 2016-01-05 ENCOUNTER — Ambulatory Visit: Payer: 59 | Admitting: Family Medicine

## 2016-01-11 ENCOUNTER — Ambulatory Visit (HOSPITAL_COMMUNITY)
Admission: RE | Admit: 2016-01-11 | Discharge: 2016-01-11 | Disposition: A | Discharge status: Home / Self Care | Payer: 59 | Source: Ambulatory Visit | Attending: Obstetrics & Gynecology | Admitting: Obstetrics & Gynecology

## 2016-01-11 DIAGNOSIS — Z1231 Encounter for screening mammogram for malignant neoplasm of breast: Secondary | ICD-10-CM | POA: Diagnosis not present

## 2016-02-01 ENCOUNTER — Other Ambulatory Visit: Payer: Self-pay | Admitting: Family Medicine

## 2016-02-01 DIAGNOSIS — I10 Essential (primary) hypertension: Secondary | ICD-10-CM

## 2016-03-29 ENCOUNTER — Encounter: Payer: Self-pay | Admitting: Family Medicine

## 2016-03-29 ENCOUNTER — Ambulatory Visit (INDEPENDENT_AMBULATORY_CARE_PROVIDER_SITE_OTHER): Payer: 59 | Admitting: Family Medicine

## 2016-03-29 VITALS — BP 128/82 | HR 88 | Temp 98.8°F | Resp 14 | Ht 62.0 in | Wt 306.0 lb

## 2016-03-29 DIAGNOSIS — E119 Type 2 diabetes mellitus without complications: Secondary | ICD-10-CM | POA: Diagnosis not present

## 2016-03-29 DIAGNOSIS — J069 Acute upper respiratory infection, unspecified: Secondary | ICD-10-CM

## 2016-03-29 DIAGNOSIS — I10 Essential (primary) hypertension: Secondary | ICD-10-CM

## 2016-03-29 DIAGNOSIS — K219 Gastro-esophageal reflux disease without esophagitis: Secondary | ICD-10-CM

## 2016-03-29 MED ORDER — PANTOPRAZOLE SODIUM 40 MG PO TBEC: DELAYED_RELEASE_TABLET | ORAL | 5 refills | Status: DC

## 2016-03-29 MED ORDER — HYDROCOD POLST-CPM POLST ER 10-8 MG/5ML PO SUER: 5.0000 mL | Freq: Two times a day (BID) | ORAL | 0 refills | Status: DC | PRN

## 2016-03-29 MED ORDER — AZITHROMYCIN 250 MG PO TABS: ORAL_TABLET | ORAL | 0 refills | Status: DC

## 2016-03-29 NOTE — Patient Instructions (Signed)
Continue current medications F/U 4 months  

## 2016-03-29 NOTE — Progress Notes (Signed)
   Subjective:    Patient ID: Monique Gomez, female    DOB: 03-20-1956, 60 y.o.   MRN: [id number]  Patient presents for Illness (x2 weeks- chest congestion. productive cough with green mucus, post nasal drip, sore throat, cough worsens at night, hoarse)  Pt here with cough with productions, congestion, worsened over past 2 weeks.  Laryngitis last week as well. Low grade fever in beginning 100F tmax  Cough keeping her up, thick production, non smoker. Tried OTC meds they have not helped   DM- taking MTF last A1C 6.7%  7 months ago , but, not taking checking blood sugar  Weight down 7lbs  Review Of Systems:  GEN- +s fatigue, fever, weight loss,weakness, recent illness HEENT- denies eye drainage, change in vision, +nasal discharge, CVS- denies chest pain, palpitations RESP- denies SOB, +cough, wheeze ABD- denies N/V, change in stools, abd pain GU- denies dysuria, hematuria, dribbling, incontinence MSK- denies joint pain, muscle aches, injury Neuro- denies headache, dizziness, syncope, seizure activity       Objective:    BP 128/82 (BP Location: Left Arm, Patient Position: Sitting, Cuff Size: Large)   Pulse 88   Temp 98.8 F (37.1 C) (Oral)   Resp 14   Ht 5\' 2"  (1.575 m)   Wt (!) 306 lb (138.8 kg)   SpO2 97%   BMI 55.97 kg/m  GEN- NAD, alert and oriented x3 HEENT- PERRL, EOMI, non injected sclera, pink conjunctiva, MMM, oropharynx mild injections, TM clear bilat, no effusion, nares +rhinorhrea, hoarse voice  Neck- Supple, no LAD  CVS- RRR, no murmur RESP-clear with upper aiway congestion  ABD-NABS,soft,NT,ND EXT- No edema Pulses- Radial 2+        Assessment & Plan:      Problem List Items Addressed This Visit    Essential hypertension, benign - Primary    Well controlled       Relevant Orders   Basic metabolic panel   Hemoglobin A1c   CBC with Differential/Platelet   Diabetes mellitus ([place])    Recheck A1C, continues to work on weight loss  On statin and  ACEI      Relevant Orders   Hemoglobin A1c   CBC with Differential/Platelet    Other Visit Diagnoses    Gastroesophageal reflux disease without esophagitis       Relevant Medications   pantoprazole (PROTONIX) 40 MG tablet   Acute URI       zpak, tussionex , high risk for decompensation   Relevant Medications   azithromycin (ZITHROMAX) 250 MG tablet      Note: This dictation was prepared with Dragon dictation along with smaller phrase technology. Any transcriptional errors that result from this process are unintentional.

## 2016-03-29 NOTE — Assessment & Plan Note (Signed)
Well controlled 

## 2016-03-29 NOTE — Assessment & Plan Note (Signed)
Recheck A1C, continues to work on weight loss  On statin and ACEI

## 2016-03-30 LAB — CBC WITH DIFFERENTIAL/PLATELET
BASOS PCT: 0 %
Basophils Absolute: 0 cells/uL (ref 0–200)
Eosinophils Absolute: 279 cells/uL (ref 15–500)
Eosinophils Relative: 3 %
HCT: 34.2 % — ABNORMAL LOW (ref 35.0–45.0)
Hemoglobin: 10.2 g/dL — ABNORMAL LOW (ref 12.0–15.0)
LYMPHS PCT: 20 %
Lymphs Abs: 1860 cells/uL (ref 850–3900)
MCH: 19.2 pg — ABNORMAL LOW (ref 27.0–33.0)
MCHC: 29.8 g/dL — AB (ref 32.0–36.0)
MCV: 64.3 fL — ABNORMAL LOW (ref 80.0–100.0)
MONOS PCT: 10 %
MPV: 9.5 fL (ref 7.5–12.5)
Monocytes Absolute: 930 cells/uL (ref 200–950)
NEUTROS PCT: 67 %
Neutro Abs: 6231 cells/uL (ref 1500–7800)
PLATELETS: 380 10*3/uL (ref 140–400)
RBC: 5.32 MIL/uL — AB (ref 3.80–5.10)
RDW: 17 % — AB (ref 11.0–15.0)
WBC: 9.3 10*3/uL (ref 3.8–10.8)

## 2016-03-30 LAB — BASIC METABOLIC PANEL
BUN: 11 mg/dL (ref 7–25)
CALCIUM: 9.1 mg/dL (ref 8.6–10.4)
CHLORIDE: 104 mmol/L (ref 98–110)
CO2: 32 mmol/L — ABNORMAL HIGH (ref 20–31)
CREATININE: 0.64 mg/dL (ref 0.50–1.05)
Glucose, Bld: 117 mg/dL — ABNORMAL HIGH (ref 70–99)
Potassium: 4.1 mmol/L (ref 3.5–5.3)
Sodium: 141 mmol/L (ref 135–146)

## 2016-03-30 LAB — HEMOGLOBIN A1C
Hgb A1c MFr Bld: 6.9 % — ABNORMAL HIGH (ref <5.7)
MEAN PLASMA GLUCOSE: 151 mg/dL

## 2016-04-05 ENCOUNTER — Other Ambulatory Visit: Payer: Self-pay | Admitting: Family Medicine

## 2016-05-30 ENCOUNTER — Ambulatory Visit: Payer: 59 | Admitting: Obstetrics & Gynecology

## 2016-07-12 ENCOUNTER — Other Ambulatory Visit: Payer: Self-pay | Admitting: Family Medicine

## 2016-07-27 ENCOUNTER — Ambulatory Visit: Payer: 59 | Admitting: Family Medicine

## 2016-08-10 ENCOUNTER — Other Ambulatory Visit: Payer: Self-pay | Admitting: Family Medicine

## 2016-08-10 DIAGNOSIS — I10 Essential (primary) hypertension: Secondary | ICD-10-CM

## 2016-09-13 ENCOUNTER — Ambulatory Visit (INDEPENDENT_AMBULATORY_CARE_PROVIDER_SITE_OTHER): Payer: 59 | Admitting: Orthopedic Surgery

## 2016-09-13 ENCOUNTER — Encounter: Payer: Self-pay | Admitting: Orthopedic Surgery

## 2016-09-13 DIAGNOSIS — M654 Radial styloid tenosynovitis [de Quervain]: Secondary | ICD-10-CM

## 2016-09-13 DIAGNOSIS — M75102 Unspecified rotator cuff tear or rupture of left shoulder, not specified as traumatic: Secondary | ICD-10-CM

## 2016-09-13 NOTE — Progress Notes (Signed)
Chief Complaint  Patient presents with  . Follow-up    left shouder, requests injection And right wrist     Procedure note the subacromial injection shoulder left   Verbal consent was obtained to inject the  Left   Shoulder  Timeout was completed to confirm the injection site is a subacromial space of the  left  shoulder  Medication used Depo-Medrol 40 mg and lidocaine 1% 3 cc  Anesthesia was provided by ethyl chloride  The injection was performed in the left  posterior subacromial space. After pinning the skin with alcohol and anesthetized the skin with ethyl chloride the subacromial space was injected using a 20-gauge needle. There were no complications  Sterile dressing was applied.          Right wrist painful Finkelstein's test and tenderness first compartment right wrist  The patient gave consent for right wrist injection timeout was completed confirm site of injection as right wrist  We used Depo-Medrol 40 mg and lidocaine 1% 3 mL and ethyl chloride to anesthetize the skin  After sterile cleansing with alcohol the first extensor compartment was injected without complication sterile dressing applied  The patient will follow-up as needed

## 2016-09-14 ENCOUNTER — Other Ambulatory Visit: Payer: Self-pay | Admitting: Family Medicine

## 2016-09-28 NOTE — Progress Notes (Signed)
Cardiology Office Note  Date: 09/29/2016   ID: Monique Gomez, DOB 1955/06/20, MRN [id number]  PCP: Alycia Rossetti, MD  Primary Cardiologist: Rozann Lesches, MD   Chief Complaint  Patient presents with  . PAF    History of Present Illness: Monique Gomez is a 61 y.o. female last seen in June 2017. She presents for a routine follow-up visit. She does not report any significant palpitations or chest pain on current regimen. 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  She reports compliance with her medications. Current cardiac regimen includes atenolol, Lipitor, Eliquis, and lisinopril. She has not had interval lab work since November. She plans to get this with Dr. Buelah Manis. She does not report any spontaneous bleeding problems.  CHADSVASC score is 3. She continues on Eliquis for stroke prophylaxis.  Past Medical History:  Diagnosis Date  . Allergic rhinitis   . Anemia   . Essential hypertension, benign   . GERD (gastroesophageal reflux disease)   . Headache(784.0)   . History of cardiac catheterization    No significant CAD May 2015  . History of migraine headaches   . Hyperlipidemia   . Obesity   . PAF (paroxysmal atrial fibrillation) ([place]) 08/2013  . Type 2 diabetes mellitus ([place])     Past Surgical History:  Procedure Laterality Date  . COLONOSCOPY N/A 02/02/2015   SLF: 1. one colon polyp removed-no source for anemia identified. 2. moderate diverticulosis noted in the sigmoid colon and descending colon 3. the left colon is redundant 4. Rectal bleeding due ot small internal hemorroids 5. Moderate sized external hemorrhoids.  . ESOPHAGOGASTRODUODENOSCOPY N/A 02/02/2015   SLF: 1. 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 large hiatal hernia 3. mild non-erosive gastritis 4. No source for anemia identified.   Marland Kitchen LEFT HEART CATHETERIZATION WITH CORONARY ANGIOGRAM N/A 09/03/2013   Procedure: LEFT HEART CATHETERIZATION WITH  CORONARY ANGIOGRAM;  Surgeon: Jettie Booze, MD;  Location: [facility] CATH LAB;  Service: Cardiovascular;  Laterality: N/A;  . Right carpal tunnel release      Current Outpatient Prescriptions  Medication Sig Dispense Refill  . acetaminophen (TYLENOL) 500 MG tablet Take 1,000 mg by mouth every 6 (six) hours as needed for moderate pain.     Marland Kitchen atenolol (TENORMIN) 25 MG tablet TAKE ONE TABLET BY MOUTH ONCE DAILY 90 tablet 1  . atorvastatin (LIPITOR) 20 MG tablet TAKE ONE TABLET BY MOUTH ONCE DAILY 30 tablet 0  . ELIQUIS 5 MG TABS tablet TAKE ONE TABLET (5 MG TOTAL) BY MOUTH TWICE DAILY 60 tablet 11  . Iron-FA-B Cmp-C-Biot-Probiotic (FUSION PLUS) CAPS 1 capsule daily 30 capsule 6  . lisinopril (PRINIVIL,ZESTRIL) 10 MG tablet TAKE ONE TABLET BY MOUTH ONCE DAILY 90 tablet 2  . metFORMIN (GLUCOPHAGE-XR) 500 MG 24 hr tablet TAKE ONE TABLET BY MOUTH ONCE DAILY IN THE EVENING 90 tablet 1  . pantoprazole (PROTONIX) 40 MG tablet TAKE ONE TABLET BY MOUTH bid 90 tablet 5   No current facility-administered medications for this visit.    Allergies:  Patient has no known allergies.   Social History: The patient  reports that she quit smoking about 4 years ago. Her smoking use included Cigarettes. She smoked 0.30 packs per day. She has never used smokeless tobacco. She reports that she drinks alcohol. She reports that she does not use drugs.   ROS:  Please see the history of present illness. Otherwise, complete review of systems is positive for none.  All other systems are reviewed and negative.   Physical Exam: VS:  BP 124/85   Pulse 66   Ht _0  (1.575 m)   Wt (!) 305 lb 12.8 oz (138.7 kg)   SpO2 96%   BMI 55.93 kg/m , BMI Body mass index is 55.93 kg/m.  Wt Readings from Last 3 Encounters:  09/29/16 (!) 305 lb 12.8 oz (138.7 kg)  03/29/16 (!) 306 lb (138.8 kg)  11/26/15 (!) 314 lb (142.4 kg)    General: Obese woman, appears comfortable at rest. HEENT: Conjunctiva and lids normal, oropharynx  clear. Neck: Supple, no elevated JVP or carotid bruits, no thyromegaly. Lungs: Clear to auscultation, nonlabored breathing at rest. Cardiac: Regular rate and rhythm, no S3 or significant systolic murmur, no pericardial rub. Abdomen: Soft, nontender, bowel sounds present, no guarding or rebound. Extremities: No pitting edema, distal pulses 2+.  ECG: I personally reviewed the tracing from 01/07/2015 which showed sinus rhythm with nonspecific ST-T changes.  Recent Labwork: 03/29/2016: BUN 11; Creat 0.64; Hemoglobin 10.2; Platelets 380; Potassium 4.1; Sodium 141     Component Value Date/Time   CHOL 142 08/20/2015 0935   TRIG 65 08/20/2015 0935   HDL 36 (L) 08/20/2015 0935   CHOLHDL 3.9 08/20/2015 0935   VLDL 13 08/20/2015 0935   LDLCALC 93 08/20/2015 0935    Other Studies Reviewed Today:  Echocardiogram 09/02/2013: Study Conclusions  - Left ventricle: The cavity size was normal. Wall thickness was increased in a pattern of mild to moderateLVH. Systolic function was normal. The estimated ejection fraction was in the range of 60% to 65%. Wall motion was normal; there were no regional wall motion abnormalities. The study is not technically sufficient to allow evaluation of LV diastolic function. - Aortic valve: Mildly calcified annulus. Trileaflet. No significant regurgitation. - Mitral valve: Calcified annulus. Mild regurgitation. - Left atrium: The atrium was at the upper limits of normal in size. - Right ventricle: The cavity size was mildly dilated. - Right atrium: Central venous pressure: 75m Hg (est). - Atrial septum: No defect or patent foramen ovale was identified. - Tricuspid valve: Mild regurgitation. - Pulmonary arteries: PA peak pressure: 372mHg (S). - Pericardium, extracardiac: There was no pericardial effusion.  Impressions:  - Mild to moderate LVH with LVEF 60-65%. Indeterminate diastolic function. Upper normal left atrial size. MAC with  mild mitral regurgitation. Mild RV enlargement. Mild tricuspid regurgitation with PASP 33 mmHg.  Assessment and Plan:  1. Paroxysmal atrial fibrillation, symptomatically controlled at this time on current regimen including atenolol and Eliquis. CHADSVASC score is 3. She denies any bleeding problems and plans to get follow-up lab work with Dr. DuBuelah Manis 2. Essential hypertension, blood pressure is well controlled today. No changes were made in current medications.  Current medicines were reviewed with the patient today.   Orders Placed This Encounter  Procedures  . EKG 12-Lead    Disposition: Follow-up in 6 months.  Signed, SaSatira SarkMD, FAEncompass Health Rehabilitation Hospital Of Miami/31/2018 9:17 AM    [place] [id number]: [phone]: [phone]

## 2016-09-29 ENCOUNTER — Encounter: Payer: Self-pay | Admitting: Cardiology

## 2016-09-29 ENCOUNTER — Ambulatory Visit (INDEPENDENT_AMBULATORY_CARE_PROVIDER_SITE_OTHER): Payer: 59 | Admitting: Cardiology

## 2016-09-29 VITALS — BP 124/85 | HR 66 | Ht 62.0 in | Wt 305.8 lb

## 2016-09-29 DIAGNOSIS — I480 Paroxysmal atrial fibrillation: Secondary | ICD-10-CM | POA: Diagnosis not present

## 2016-09-29 DIAGNOSIS — I10 Essential (primary) hypertension: Secondary | ICD-10-CM

## 2016-09-29 NOTE — Patient Instructions (Signed)

## 2016-10-16 ENCOUNTER — Other Ambulatory Visit: Payer: Self-pay | Admitting: Family Medicine

## 2016-11-23 ENCOUNTER — Other Ambulatory Visit: Payer: Self-pay | Admitting: Family Medicine

## 2016-12-08 ENCOUNTER — Encounter (HOSPITAL_COMMUNITY): Payer: Self-pay

## 2016-12-08 ENCOUNTER — Emergency Department (HOSPITAL_COMMUNITY): Payer: 59

## 2016-12-08 ENCOUNTER — Emergency Department (HOSPITAL_COMMUNITY)
Admission: EM | Admit: 2016-12-08 | Discharge: 2016-12-08 | Disposition: A | Discharge status: Home / Self Care | Payer: 59 | Attending: Emergency Medicine | Admitting: Emergency Medicine

## 2016-12-08 DIAGNOSIS — J0120 Acute ethmoidal sinusitis, unspecified: Secondary | ICD-10-CM

## 2016-12-08 DIAGNOSIS — Z79899 Other long term (current) drug therapy: Secondary | ICD-10-CM | POA: Insufficient documentation

## 2016-12-08 DIAGNOSIS — R51 Headache: Secondary | ICD-10-CM | POA: Diagnosis present

## 2016-12-08 DIAGNOSIS — E119 Type 2 diabetes mellitus without complications: Secondary | ICD-10-CM | POA: Insufficient documentation

## 2016-12-08 DIAGNOSIS — R519 Headache, unspecified: Secondary | ICD-10-CM

## 2016-12-08 DIAGNOSIS — I10 Essential (primary) hypertension: Secondary | ICD-10-CM

## 2016-12-08 DIAGNOSIS — Z7984 Long term (current) use of oral hypoglycemic drugs: Secondary | ICD-10-CM | POA: Diagnosis not present

## 2016-12-08 DIAGNOSIS — Z7901 Long term (current) use of anticoagulants: Secondary | ICD-10-CM | POA: Diagnosis not present

## 2016-12-08 DIAGNOSIS — R42 Dizziness and giddiness: Secondary | ICD-10-CM

## 2016-12-08 DIAGNOSIS — Z87891 Personal history of nicotine dependence: Secondary | ICD-10-CM | POA: Insufficient documentation

## 2016-12-08 MED ORDER — DIPHENHYDRAMINE HCL 50 MG/ML IJ SOLN
25.0000 mg | Freq: Once | INTRAMUSCULAR | Status: AC
  Administered 2016-12-08: 25 mg via INTRAVENOUS
  Filled 2016-12-08: qty 1

## 2016-12-08 MED ORDER — METHYLPREDNISOLONE SODIUM SUCC 125 MG IJ SOLR
125.0000 mg | Freq: Once | INTRAMUSCULAR | Status: AC
  Administered 2016-12-08: 125 mg via INTRAVENOUS
  Filled 2016-12-08: qty 2

## 2016-12-08 MED ORDER — SODIUM CHLORIDE 0.9 % IV BOLUS (SEPSIS)
1000.0000 mL | Freq: Once | INTRAVENOUS | Status: AC
  Administered 2016-12-08: 1000 mL via INTRAVENOUS

## 2016-12-08 MED ORDER — PREDNISONE 20 MG PO TABS: ORAL_TABLET | ORAL | 0 refills | Status: DC

## 2016-12-08 MED ORDER — PROMETHAZINE HCL 25 MG/ML IJ SOLN
12.5000 mg | Freq: Once | INTRAMUSCULAR | Status: AC
  Administered 2016-12-08: 12.5 mg via INTRAVENOUS
  Filled 2016-12-08: qty 1

## 2016-12-08 MED ORDER — MECLIZINE HCL 12.5 MG PO TABS: 12.5000 mg | ORAL_TABLET | Freq: Three times a day (TID) | ORAL | 0 refills | Status: DC | PRN

## 2016-12-08 NOTE — ED Notes (Signed)
Hx migraine, pt complaining of head, nausea and dizziness

## 2016-12-08 NOTE — ED Notes (Signed)
Pt transported to xray 

## 2016-12-08 NOTE — ED Triage Notes (Signed)
Pt reports headache and dizziness since Friday.  Reports symptoms getting worse.  Pt went to an urgent care yesterday and was started on antibiotics for a sinus infection.  Pt says feels worse today.

## 2016-12-08 NOTE — ED Provider Notes (Signed)
[facility] DEPT Provider Note   CSN: [id number] Arrival date & time: 12/08/16  1015     History   Chief Complaint Chief Complaint  Patient presents with  . Headache    HPI Monique Gomez is a 61 y.o. female.  The history is provided by the patient.  Headache   This is a new problem. The current episode started more than 2 days ago. The problem occurs constantly. The problem has not changed since onset.The headache is associated with nothing. The pain is located in the frontal region. The pain is at a severity of 5/10. The pain is mild. The pain does not radiate. Associated symptoms include nausea. Pertinent negatives include no anorexia, no syncope and no vomiting.    Past Medical History:  Diagnosis Date  . Allergic rhinitis   . Anemia   . Essential hypertension, benign   . GERD (gastroesophageal reflux disease)   . Headache(784.0)   . History of cardiac catheterization    No significant CAD May 2015  . History of migraine headaches   . Hyperlipidemia   . Obesity   . PAF (paroxysmal atrial fibrillation) ([place]) 08/2013  . Type 2 diabetes mellitus [facility])     Patient Active Problem List   Diagnosis Date Noted  . Absolute anemia   . Screening for colon cancer 01/07/2015  . Hemorrhoids 11/25/2014  . Obstructive sleep apnea 10/14/2013  . Atrial fibrillation ([place]) 09/06/2013  . Lumbar back pain 09/06/2013  . NSTEMI (non-ST elevated myocardial infarction) ([place]) 09/03/2013  . MDD (major depressive disorder), single episode 02/04/2013  . Anxiety state, unspecified 02/04/2013  . Diabetes mellitus ([place]) 06/06/2012  . Carpal tunnel syndrome of left wrist 12/27/2011  . De Quervain's disease (tenosynovitis) 10/09/2011  . Heartburn 02/27/2011  . Allergic rhinitis 08/29/2010  . Anemia 02/15/2010  . Hyperlipidemia 12/23/2009  . Morbid obesity ([place]) 12/23/2009  . MIGRAINE HEADACHE 12/23/2009  . Essential hypertension, benign 12/23/2009    Past Surgical History:    Procedure Laterality Date  . COLONOSCOPY N/A 02/02/2015   SLF: 1. one colon polyp removed-no source for anemia identified. 2. moderate diverticulosis noted in the sigmoid colon and descending colon 3. the left colon is redundant 4. Rectal bleeding due ot small internal hemorroids 5. Moderate sized external hemorrhoids.  . ESOPHAGOGASTRODUODENOSCOPY N/A 02/02/2015   SLF: 1. 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 large hiatal hernia 3. mild non-erosive gastritis 4. No source for anemia identified.   Marland Kitchen LEFT HEART CATHETERIZATION WITH CORONARY ANGIOGRAM N/A 09/03/2013   Procedure: LEFT HEART CATHETERIZATION WITH CORONARY ANGIOGRAM;  Surgeon: Jettie Booze, MD;  Location: [facility] CATH LAB;  Service: Cardiovascular;  Laterality: N/A;  . Right carpal tunnel release      OB History    No data available       Home Medications    Prior to Admission medications   Medication Sig Start Date End Date Taking? Authorizing Provider  acetaminophen (TYLENOL) 500 MG tablet Take 1,000 mg by mouth every 6 (six) hours as needed for moderate pain.    Yes [provider]  apixaban (ELIQUIS) 2.5 MG TABS tablet Take 2.5 mg by mouth 2 (two) times daily.   Yes [provider]  Aspirin-Salicylamide-Caffeine (BC HEADACHE POWDER PO) Take 1 each by mouth every 6 (six) hours.   Yes [provider]  atenolol (TENORMIN) 25 MG tablet TAKE ONE TABLET BY MOUTH ONCE DAILY 10/17/16  Yes [place], Modena Nunnery, MD  atorvastatin (LIPITOR) 20 MG tablet TAKE 1  TABLET BY MOUTH ONCE DAILY 11/24/16  Yes [place], Modena Nunnery, MD  Iron-FA-B Cmp-C-Biot-Probiotic (FUSION PLUS) CAPS 1 capsule daily 08/26/15  Yes [place], Modena Nunnery, MD  lisinopril (PRINIVIL,ZESTRIL) 10 MG tablet TAKE ONE TABLET BY MOUTH ONCE DAILY 08/11/16  Yes [place], Modena Nunnery, MD  metFORMIN (GLUCOPHAGE-XR) 500 MG 24 hr tablet TAKE ONE TABLET BY MOUTH ONCE DAILY IN THE EVENING 07/12/16  Yes [place], Modena Nunnery, MD  pantoprazole (PROTONIX) 40 MG  tablet TAKE ONE TABLET BY MOUTH bid 03/29/16  Yes [place], Modena Nunnery, MD  meclizine (ANTIVERT) 12.5 MG tablet Take 1 tablet (12.5 mg total) by mouth 3 (three) times daily as needed for dizziness. 12/08/16   Cortnie Ringel, Corene Cornea, MD  predniSONE (DELTASONE) 20 MG tablet 3 tabs po daily x 3 days, then 2 tabs x 3 days, then 1.5 tabs x 3 days, then 1 tab x 3 days, then 0.5 tabs x 3 days 12/08/16   Loy Little, Corene Cornea, MD    Family History Family History  Problem Relation Age of Onset  . Diabetes Mother   . Hypertension Mother   . Heart disease Mother   . Hypertension Brother   . Colon cancer Neg Hx     Social History Social History  Substance Use Topics  . Smoking status: Former Smoker    Packs/day: 0.30    Types: Cigarettes    Quit date: 04/01/2012  . Smokeless tobacco: Never Used  . Alcohol use 0.0 oz/week     Comment: Wine typically 2-3 glasses every few weekends     Allergies   Patient has no known allergies.   Review of Systems Review of Systems  Respiratory: Positive for cough.   Cardiovascular: Negative for chest pain and syncope.  Gastrointestinal: Positive for nausea. Negative for anorexia and vomiting.  Neurological: Positive for headaches.  All other systems reviewed and are negative.    Physical Exam Updated Vital Signs BP (!) 173/84 (BP Location: Right Arm)   Pulse (!) 57   Temp 98 F (36.7 C) (Oral)   Resp 14   Ht 5\' 2"  (1.575 m)   Wt (!) 139.3 kg (307 lb)   SpO2 98%   BMI 56.15 kg/m   Physical Exam  Constitutional: She is oriented to person, place, and time. She appears well-developed and well-nourished.  HENT:  Head: Normocephalic and atraumatic.  Eyes: Conjunctivae and EOM are normal.  Neck: Normal range of motion.  Cardiovascular: Normal rate and regular rhythm.   Pulmonary/Chest: Effort normal and breath sounds normal. No stridor. No respiratory distress. She has no wheezes.  Abdominal: Soft. She exhibits no distension.  Neurological: She is alert and  oriented to person, place, and time. No cranial nerve deficit. Coordination normal.  No altered mental status, able to give full seemingly accurate history.  Face is symmetric, EOM's intact, pupils equal and reactive, vision intact, tongue and uvula midline without deviation. Upper and Lower extremity motor 5/5, intact pain perception in distal extremities, 2+ reflexes in biceps, patella and achilles tendons. Able to perform finger to nose normal with both hands. Walks without assistance or evident ataxia.   Skin: Skin is warm and dry.  Nursing note and vitals reviewed.    ED Treatments / Results  Labs (all labs ordered are listed, but only abnormal results are displayed) Labs Reviewed - No data to display  EKG  EKG Interpretation None       Radiology Ct Head Wo Contrast  Result Date: 12/08/2016 CLINICAL DATA:  Headache and dizziness. EXAM:  CT HEAD WITHOUT CONTRAST CT MAXILLOFACIAL WITHOUT CONTRAST TECHNIQUE: Multidetector CT imaging of the head and maxillofacial structures were performed using the standard protocol without intravenous contrast. Multiplanar CT image reconstructions of the maxillofacial structures were also generated. COMPARISON:  None. FINDINGS: CT HEAD FINDINGS Brain: The ventricles are normal in size and configuration. There is no intracranial hemorrhage, extra-axial fluid collection, or midline shift. There is slight small vessel disease in the centra semiovale bilaterally. Elsewhere gray-white compartments appear normal. No evident acute infarct. There is bony overgrowth, benign in appearance, just lateral to the carotid siphon on the left measuring 1.4 x 0.5 cm. There is no surrounding edema in this area. This area likely represents a benign calcified meningioma. There is no other evidence of mass. Vascular: No appreciable hyperdense vessel. There is a rather minimal calcification in the right carotid siphon. Skull: Bony calvarium appears intact. There is mild frontal  hyperostosis bilaterally. Other: Mastoid air cells are clear. There is debris in each external auditory canal. CT MAXILLOFACIAL FINDINGS Osseous: No evident fracture or dislocation. No blastic or lytic bone lesions. Orbits: Orbits appear symmetric and normal bilaterally. No intraorbital lesions are evident. Sinuses: There is slight mucosal thickening in several ethmoid air cells bilaterally. Frontal sinuses are somewhat hypoplastic. Other paranasal sinuses are clear. No air-fluid levels. No bony destruction or expansion. Ostiomeatal unit complexes are patent bilaterally. There is no nares obstruction. Nasal septum is in the midline. Soft tissues: Salivary glands appear symmetric and normal bilaterally. No adenopathy. Tongue and tongue base regions appear normal. There is no appreciable facial for periorbital soft tissue edema. No abscess evident. IMPRESSION: CT head: Slight periventricular small vessel disease. No evidence acute infarct. No hemorrhage, edema, or mass effect. No extra-axial fluid. Focal calcification just to the left of the left carotid siphon may represent residua of calcified meningioma. This area appears benign and of no clinical significance. No other evidence suggesting mass. There is probable cerumen in each external auditory canal. CT maxillofacial: Mild mucosal thickening in several ethmoid air cells. Paranasal sinuses elsewhere clear. Ostiomeatal unit complexes are patent. No soft tissue mass or adenopathy. No neoplastic focus. Bony structures appear intact. Electronically Signed   By: Lowella Grip III M.D.   On: 12/08/2016 11:37   Ct Maxillofacial Wo Contrast  Result Date: 12/08/2016 CLINICAL DATA:  Headache and dizziness. EXAM: CT HEAD WITHOUT CONTRAST CT MAXILLOFACIAL WITHOUT CONTRAST TECHNIQUE: Multidetector CT imaging of the head and maxillofacial structures were performed using the standard protocol without intravenous contrast. Multiplanar CT image reconstructions of the  maxillofacial structures were also generated. COMPARISON:  None. FINDINGS: CT HEAD FINDINGS Brain: The ventricles are normal in size and configuration. There is no intracranial hemorrhage, extra-axial fluid collection, or midline shift. There is slight small vessel disease in the centra semiovale bilaterally. Elsewhere gray-white compartments appear normal. No evident acute infarct. There is bony overgrowth, benign in appearance, just lateral to the carotid siphon on the left measuring 1.4 x 0.5 cm. There is no surrounding edema in this area. This area likely represents a benign calcified meningioma. There is no other evidence of mass. Vascular: No appreciable hyperdense vessel. There is a rather minimal calcification in the right carotid siphon. Skull: Bony calvarium appears intact. There is mild frontal hyperostosis bilaterally. Other: Mastoid air cells are clear. There is debris in each external auditory canal. CT MAXILLOFACIAL FINDINGS Osseous: No evident fracture or dislocation. No blastic or lytic bone lesions. Orbits: Orbits appear symmetric and normal bilaterally. No intraorbital lesions are evident. Sinuses:  There is slight mucosal thickening in several ethmoid air cells bilaterally. Frontal sinuses are somewhat hypoplastic. Other paranasal sinuses are clear. No air-fluid levels. No bony destruction or expansion. Ostiomeatal unit complexes are patent bilaterally. There is no nares obstruction. Nasal septum is in the midline. Soft tissues: Salivary glands appear symmetric and normal bilaterally. No adenopathy. Tongue and tongue base regions appear normal. There is no appreciable facial for periorbital soft tissue edema. No abscess evident. IMPRESSION: CT head: Slight periventricular small vessel disease. No evidence acute infarct. No hemorrhage, edema, or mass effect. No extra-axial fluid. Focal calcification just to the left of the left carotid siphon may represent residua of calcified meningioma. This area  appears benign and of no clinical significance. No other evidence suggesting mass. There is probable cerumen in each external auditory canal. CT maxillofacial: Mild mucosal thickening in several ethmoid air cells. Paranasal sinuses elsewhere clear. Ostiomeatal unit complexes are patent. No soft tissue mass or adenopathy. No neoplastic focus. Bony structures appear intact. Electronically Signed   By: Lowella Grip III M.D.   On: 12/08/2016 11:37    Procedures Procedures (including critical care time)  Medications Ordered in ED Medications  sodium chloride 0.9 % bolus 1,000 mL (0 mLs Intravenous Stopped 12/08/16 1240)  methylPREDNISolone sodium succinate (SOLU-MEDROL) 125 mg/2 mL injection 125 mg (125 mg Intravenous Given 12/08/16 1134)  promethazine (PHENERGAN) injection 12.5 mg (12.5 mg Intravenous Given 12/08/16 1134)  diphenhydrAMINE (BENADRYL) injection 25 mg (25 mg Intravenous Given 12/08/16 1130)     Initial Impression / Assessment and Plan / ED Course  I have reviewed the triage vital signs and the nursing notes.  Pertinent labs & imaging results that were available during my care of the patient were reviewed by me and considered in my medical decision making (see chart for details).     Likely sinus headache. Improved here with ha cocktail. No obvious mass/stroke on CT scan.   Final Clinical Impressions(s) / ED Diagnoses   Final diagnoses:  Acute nonintractable headache, unspecified headache type  Hypertension, unspecified type  Vertigo  Acute non-recurrent ethmoidal sinusitis    New Prescriptions Discharge Medication List as of 12/08/2016 12:24 PM    START taking these medications   Details  meclizine (ANTIVERT) 12.5 MG tablet Take 1 tablet (12.5 mg total) by mouth 3 (three) times daily as needed for dizziness., Starting Thu 12/08/2016, Print    predniSONE (DELTASONE) 20 MG tablet 3 tabs po daily x 3 days, then 2 tabs x 3 days, then 1.5 tabs x 3 days, then 1 tab x 3 days, then  0.5 tabs x 3 days, Print         Arrin Ishler, Corene Cornea, MD 12/08/16 1407

## 2017-01-09 ENCOUNTER — Other Ambulatory Visit: Payer: Self-pay | Admitting: Family Medicine

## 2017-01-20 ENCOUNTER — Other Ambulatory Visit: Payer: Self-pay | Admitting: Family Medicine

## 2017-01-20 DIAGNOSIS — K219 Gastro-esophageal reflux disease without esophagitis: Secondary | ICD-10-CM

## 2017-01-23 MED ORDER — PANTOPRAZOLE SODIUM 40 MG PO TBEC: DELAYED_RELEASE_TABLET | ORAL | 0 refills | Status: DC

## 2017-01-23 NOTE — Telephone Encounter (Signed)
Eliquis 2.5 mg on medication list.  Pharmacy asking for refill of 5 mg tablet.  Which is correct??

## 2017-01-23 NOTE — Telephone Encounter (Signed)
She needs an OV 

## 2017-01-23 NOTE — Telephone Encounter (Signed)
Medication refill for one time only.  Patient needs to be seen.  

## 2017-01-23 NOTE — Telephone Encounter (Signed)
Cardiology changed back to 5mg  BID

## 2017-02-10 ENCOUNTER — Ambulatory Visit: Payer: 59 | Admitting: Family Medicine

## 2017-02-20 ENCOUNTER — Encounter: Payer: Self-pay | Admitting: Family Medicine

## 2017-02-20 ENCOUNTER — Ambulatory Visit (INDEPENDENT_AMBULATORY_CARE_PROVIDER_SITE_OTHER): Payer: 59 | Admitting: Family Medicine

## 2017-02-20 VITALS — BP 128/68 | HR 68 | Temp 98.1°F | Resp 14 | Ht 62.0 in | Wt 311.0 lb

## 2017-02-20 DIAGNOSIS — E782 Mixed hyperlipidemia: Secondary | ICD-10-CM

## 2017-02-20 DIAGNOSIS — E119 Type 2 diabetes mellitus without complications: Secondary | ICD-10-CM | POA: Diagnosis not present

## 2017-02-20 DIAGNOSIS — D649 Anemia, unspecified: Secondary | ICD-10-CM | POA: Diagnosis not present

## 2017-02-20 DIAGNOSIS — I10 Essential (primary) hypertension: Secondary | ICD-10-CM | POA: Diagnosis not present

## 2017-02-20 DIAGNOSIS — I480 Paroxysmal atrial fibrillation: Secondary | ICD-10-CM

## 2017-02-20 DIAGNOSIS — G4733 Obstructive sleep apnea (adult) (pediatric): Secondary | ICD-10-CM

## 2017-02-20 DIAGNOSIS — K219 Gastro-esophageal reflux disease without esophagitis: Secondary | ICD-10-CM

## 2017-02-20 NOTE — Assessment & Plan Note (Signed)
Blood pressure controlled no change in medication. 

## 2017-02-20 NOTE — Assessment & Plan Note (Signed)
Followed by cardiology she is on chronic anticoagulation with Eliquis.

## 2017-02-20 NOTE — Progress Notes (Signed)
   Subjective:    Patient ID: Monique Gomez, female    DOB: 1955-11-03, 61 y.o.   MRN: [id number]  Patient presents for Medication Management (is not fasting)  Follow-up chronic medical problems.  Her last visit was in November 2017. Diabaetes mellitus-last A1c 6.9% she has been on metformin 500 mg extended release once a day cbg range - not checking   Is history of heart attack as well as atrial fibrillation she has been on Eliquis her last visit with cardiology was in May 2017  History of anemia was being followed by gastroenterology due for repeat labs, has bene eating more iron rich foods   Hypertension- taking blood pressure medicines as prescribed  Jerrye Bushy- Taking protnonix   OSA- needs mask for cpap - [place]     Medications reviewed  Eye doctor- [place]  Dentist UTD     Review Of Systems:  GEN- denies fatigue, fever, weight loss,weakness, recent illness HEENT- denies eye drainage, change in vision, nasal discharge, CVS- denies chest pain, palpitations RESP- denies SOB, cough, wheeze ABD- denies N/V, change in stools, abd pain GU- denies dysuria, hematuria, dribbling, incontinence MSK- denies joint pain, muscle aches, injury Neuro- denies headache, dizziness, syncope, seizure activity       Objective:    BP 128/68   Pulse 68   Temp 98.1 F (36.7 C) (Oral)   Resp 14   Ht 5\' 2"  (1.575 m)   Wt (!) 311 lb (141.1 kg)   SpO2 98%   BMI 56.88 kg/m  GEN- NAD, alert and oriented x3 HEENT- PERRL, EOMI, non injected sclera, pink conjunctiva, MMM, oropharynx clear, birthmark on face  Neck- Supple, no thyromegaly CVS- RRR, no murmur RESP-CTAB ABD-NABS,soft,NT,ND EXT- No edema Pulses- Radial, DP- 2+        Assessment & Plan:      Problem List Items Addressed This Visit      Unprioritized   Hyperlipidemia   Relevant Orders   Lipid panel   Morbid obesity ([place])   Obstructive sleep apnea    CPAP supplies ordered      Essential  hypertension, benign    Blood pressure controlled no change in medication      Diabetes mellitus (HCC) - Primary    Recheck A1c she is on minimal medication.  Goal is to keep her A1c less than 7%.  Also spent time discussing her dietary habits and need for significant weight loss.      Relevant Orders   CBC with Differential/Platelet   Comprehensive metabolic panel   Hemoglobin A1c   HM DIABETES FOOT EXAM (Completed)   Microalbumin / creatinine urine ratio   Atrial fibrillation (HCC)    Followed by cardiology she is on chronic anticoagulation with Eliquis.      Anemia    No active bleeding she has been taking her iron tablets fairly regular.  We will check her iron ferritin panel she is not follow-up with GI      Relevant Orders   Iron, TIBC and Ferritin Panel      Note: This dictation was prepared with Dragon dictation along with smaller phrase technology. Any transcriptional errors that result from this process are unintentional.

## 2017-02-20 NOTE — Patient Instructions (Signed)
F/U 6 months for PHYSICAL  We will call with lab results

## 2017-02-20 NOTE — Assessment & Plan Note (Signed)
Recheck A1c she is on minimal medication.  Goal is to keep her A1c less than 7%.  Also spent time discussing her dietary habits and need for significant weight loss.

## 2017-02-20 NOTE — Assessment & Plan Note (Signed)
No active bleeding she has been taking her iron tablets fairly regular.  We will check her iron ferritin panel she is not follow-up with GI

## 2017-02-20 NOTE — Assessment & Plan Note (Signed)
CPAP supplies ordered

## 2017-02-21 LAB — HEMOGLOBIN A1C
EAG (MMOL/L): 7.7 (calc)
HEMOGLOBIN A1C: 6.5 %{Hb} — AB (ref <5.7)
Mean Plasma Glucose: 140 (calc)

## 2017-02-21 LAB — COMPREHENSIVE METABOLIC PANEL
AG Ratio: 1.1 (calc) (ref 1.0–2.5)
ALBUMIN MSPROF: 4 g/dL (ref 3.6–5.1)
ALKALINE PHOSPHATASE (APISO): 79 U/L (ref 33–130)
ALT: 7 U/L (ref 6–29)
AST: 13 U/L (ref 10–35)
BILIRUBIN TOTAL: 0.3 mg/dL (ref 0.2–1.2)
BUN: 15 mg/dL (ref 7–25)
CALCIUM: 9.6 mg/dL (ref 8.6–10.4)
CO2: 29 mmol/L (ref 20–32)
CREATININE: 0.8 mg/dL (ref 0.50–0.99)
Chloride: 103 mmol/L (ref 98–110)
GLOBULIN: 3.6 g/dL (ref 1.9–3.7)
Glucose, Bld: 91 mg/dL (ref 65–99)
POTASSIUM: 4.1 mmol/L (ref 3.5–5.3)
Sodium: 140 mmol/L (ref 135–146)
Total Protein: 7.6 g/dL (ref 6.1–8.1)

## 2017-02-21 LAB — CBC WITH DIFFERENTIAL/PLATELET
BASOS PCT: 0.5 %
Basophils Absolute: 50 cells/uL (ref 0–200)
EOS ABS: 130 {cells}/uL (ref 15–500)
Eosinophils Relative: 1.3 %
HEMATOCRIT: 36.9 % (ref 35.0–45.0)
HEMOGLOBIN: 11.1 g/dL — AB (ref 11.7–15.5)
LYMPHS ABS: 2480 {cells}/uL (ref 850–3900)
MCH: 19.3 pg — ABNORMAL LOW (ref 27.0–33.0)
MCHC: 30.1 g/dL — ABNORMAL LOW (ref 32.0–36.0)
MCV: 64.1 fL — AB (ref 80.0–100.0)
MPV: 11.1 fL (ref 7.5–12.5)
Monocytes Relative: 8 %
NEUTROS ABS: 6540 {cells}/uL (ref 1500–7800)
Neutrophils Relative %: 65.4 %
PLATELETS: 410 10*3/uL — AB (ref 140–400)
RBC: 5.76 10*6/uL — ABNORMAL HIGH (ref 3.80–5.10)
RDW: 16.8 % — ABNORMAL HIGH (ref 11.0–15.0)
Total Lymphocyte: 24.8 %
WBC: 10 10*3/uL (ref 3.8–10.8)
WBCMIX: 800 {cells}/uL (ref 200–950)

## 2017-02-21 LAB — IRON,TIBC AND FERRITIN PANEL
%SAT: 8 % — AB (ref 11–50)
FERRITIN: 27 ng/mL (ref 20–288)
Iron: 35 ug/dL — ABNORMAL LOW (ref 45–160)
TIBC: 448 ug/dL (ref 250–450)

## 2017-02-21 LAB — LIPID PANEL
CHOL/HDL RATIO: 5.3 (calc) — AB (ref <5.0)
CHOLESTEROL: 222 mg/dL — AB (ref <200)
HDL: 42 mg/dL — AB (ref >50)
LDL Cholesterol (Calc): 160 mg/dL (calc) — ABNORMAL HIGH
Non-HDL Cholesterol (Calc): 180 mg/dL (calc) — ABNORMAL HIGH (ref <130)
Triglycerides: 90 mg/dL (ref <150)

## 2017-02-21 LAB — CBC MORPHOLOGY

## 2017-02-21 MED ORDER — ATORVASTATIN CALCIUM 20 MG PO TABS: 20.0000 mg | ORAL_TABLET | Freq: Every day | ORAL | 3 refills | Status: DC

## 2017-02-21 MED ORDER — PANTOPRAZOLE SODIUM 40 MG PO TBEC: DELAYED_RELEASE_TABLET | ORAL | 3 refills | Status: DC

## 2017-02-21 MED ORDER — ATENOLOL 25 MG PO TABS: 25.0000 mg | ORAL_TABLET | Freq: Every day | ORAL | 1 refills | Status: DC

## 2017-02-21 MED ORDER — METFORMIN HCL ER 500 MG PO TB24: 500.0000 mg | ORAL_TABLET | Freq: Every evening | ORAL | 3 refills | Status: DC

## 2017-02-21 MED ORDER — LISINOPRIL 10 MG PO TABS: 10.0000 mg | ORAL_TABLET | Freq: Every day | ORAL | 2 refills | Status: DC

## 2017-02-21 MED ORDER — APIXABAN 5 MG PO TABS: 5.0000 mg | ORAL_TABLET | Freq: Two times a day (BID) | ORAL | 3 refills | Status: DC

## 2017-02-21 NOTE — Addendum Note (Signed)
Addended by: Sheral Flow on: 02/21/2017 04:31 PM   Modules accepted: Orders

## 2017-02-23 ENCOUNTER — Other Ambulatory Visit: Payer: Self-pay | Admitting: *Deleted

## 2017-02-23 MED ORDER — FUSION PLUS PO CAPS: ORAL_CAPSULE | ORAL | 6 refills | Status: DC

## 2017-02-23 MED ORDER — ATORVASTATIN CALCIUM 40 MG PO TABS: 40.0000 mg | ORAL_TABLET | Freq: Every day | ORAL | 6 refills | Status: DC

## 2017-02-27 LAB — HM DIABETES EYE EXAM

## 2017-03-13 ENCOUNTER — Encounter: Payer: Self-pay | Admitting: Family Medicine

## 2017-06-23 ENCOUNTER — Ambulatory Visit: Payer: 59 | Admitting: Family Medicine

## 2017-07-25 ENCOUNTER — Emergency Department (HOSPITAL_COMMUNITY)
Admission: EM | Admit: 2017-07-25 | Discharge: 2017-07-25 | Disposition: A | Discharge status: Home / Self Care | Payer: 59 | Attending: Emergency Medicine | Admitting: Emergency Medicine

## 2017-07-25 ENCOUNTER — Encounter (HOSPITAL_COMMUNITY): Payer: Self-pay | Admitting: Cardiology

## 2017-07-25 DIAGNOSIS — E119 Type 2 diabetes mellitus without complications: Secondary | ICD-10-CM | POA: Diagnosis not present

## 2017-07-25 DIAGNOSIS — I10 Essential (primary) hypertension: Secondary | ICD-10-CM | POA: Diagnosis not present

## 2017-07-25 DIAGNOSIS — Z7984 Long term (current) use of oral hypoglycemic drugs: Secondary | ICD-10-CM | POA: Diagnosis not present

## 2017-07-25 DIAGNOSIS — I252 Old myocardial infarction: Secondary | ICD-10-CM | POA: Diagnosis not present

## 2017-07-25 DIAGNOSIS — Z87891 Personal history of nicotine dependence: Secondary | ICD-10-CM | POA: Diagnosis not present

## 2017-07-25 DIAGNOSIS — Z7901 Long term (current) use of anticoagulants: Secondary | ICD-10-CM | POA: Diagnosis not present

## 2017-07-25 DIAGNOSIS — R591 Generalized enlarged lymph nodes: Secondary | ICD-10-CM

## 2017-07-25 DIAGNOSIS — Z79899 Other long term (current) drug therapy: Secondary | ICD-10-CM | POA: Diagnosis not present

## 2017-07-25 DIAGNOSIS — M542 Cervicalgia: Secondary | ICD-10-CM | POA: Diagnosis present

## 2017-07-25 MED ORDER — CEPHALEXIN 500 MG PO CAPS: 500.0000 mg | ORAL_CAPSULE | Freq: Four times a day (QID) | ORAL | 0 refills | Status: DC

## 2017-07-25 NOTE — ED Triage Notes (Signed)
Pt c/o swelling and pain behind right ear.  Thinks she got bit by an insect.

## 2017-07-26 NOTE — ED Provider Notes (Signed)
[facility] EMERGENCY DEPARTMENT Provider Note   CSN: [id number] Arrival date & time: 07/25/17  1741     History   Chief Complaint Chief Complaint  Patient presents with  . Insect Bite    HPI Monique Gomez is a 62 y.o. female.  Pt complains of a swollen area to the the side of her neck.  Pt reports the area is very sore.  Pt reports painful to touch.  Pt denies any injury. No fever, no chills, no cuts, no scalp issues.    The history is provided by the patient. No language interpreter was used.  Neck Injury  This is a new problem. The current episode started yesterday. The problem has been rapidly worsening. Pertinent negatives include no headaches. Nothing aggravates the symptoms. Nothing relieves the symptoms. She has tried nothing for the symptoms. The treatment provided no relief.    Past Medical History:  Diagnosis Date  . Allergic rhinitis   . Anemia   . Essential hypertension, benign   . GERD (gastroesophageal reflux disease)   . Headache(784.0)   . History of cardiac catheterization    No significant CAD May 2015  . History of migraine headaches   . Hyperlipidemia   . Obesity   . PAF (paroxysmal atrial fibrillation) ([place]) 08/2013  . Type 2 diabetes mellitus [facility])     Patient Active Problem List   Diagnosis Date Noted  . Screening for colon cancer 01/07/2015  . Hemorrhoids 11/25/2014  . Obstructive sleep apnea 10/14/2013  . Atrial fibrillation ([place]) 09/06/2013  . Lumbar back pain 09/06/2013  . NSTEMI (non-ST elevated myocardial infarction) ([place]) 09/03/2013  . MDD (major depressive disorder), single episode 02/04/2013  . Anxiety state, unspecified 02/04/2013  . Diabetes mellitus ([place]) 06/06/2012  . Carpal tunnel syndrome of left wrist 12/27/2011  . De Quervain's disease (tenosynovitis) 10/09/2011  . Heartburn 02/27/2011  . Allergic rhinitis 08/29/2010  . Anemia 02/15/2010  . Hyperlipidemia 12/23/2009  . Morbid obesity ([place]) 12/23/2009  . MIGRAINE  HEADACHE 12/23/2009  . Essential hypertension, benign 12/23/2009    Past Surgical History:  Procedure Laterality Date  . CATARACT EXTRACTION    . COLONOSCOPY N/A 02/02/2015   SLF: 1. one colon polyp removed-no source for anemia identified. 2. moderate diverticulosis noted in the sigmoid colon and descending colon 3. the left colon is redundant 4. Rectal bleeding due ot small internal hemorroids 5. Moderate sized external hemorrhoids.  . ESOPHAGOGASTRODUODENOSCOPY N/A 02/02/2015   SLF: 1. 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 large hiatal hernia 3. mild non-erosive gastritis 4. No source for anemia identified.   Marland Kitchen LEFT HEART CATHETERIZATION WITH CORONARY ANGIOGRAM N/A 09/03/2013   Procedure: LEFT HEART CATHETERIZATION WITH CORONARY ANGIOGRAM;  Surgeon: Jettie Booze, MD;  Location: [facility] CATH LAB;  Service: Cardiovascular;  Laterality: N/A;  . Right carpal tunnel release       OB History   None      Home Medications    Prior to Admission medications   Medication Sig Start Date End Date Taking? Authorizing Provider  acetaminophen (TYLENOL) 500 MG tablet Take 1,000 mg by mouth every 6 (six) hours as needed for moderate pain.     [provider]  apixaban (ELIQUIS) 5 MG TABS tablet Take 1 tablet (5 mg total) by mouth 2 (two) times daily. 02/21/17   [place], Modena Nunnery, MD  Aspirin-Salicylamide-Caffeine [facility] HEADACHE POWDER PO) Take 1 each by mouth every 6 (six) hours.    [provider]  atenolol (TENORMIN)  25 MG tablet Take 1 tablet (25 mg total) by mouth daily. 02/21/17   Alycia Rossetti, MD  atorvastatin (LIPITOR) 40 MG tablet Take 1 tablet (40 mg total) by mouth daily at 6 PM. 02/23/17   [place], Modena Nunnery, MD  cephALEXin (KEFLEX) 500 MG capsule Take 1 capsule (500 mg total) by mouth 4 (four) times daily. 07/25/17   Fransico Meadow, PA-C  Iron-FA-B Cmp-C-Biot-Probiotic (FUSION PLUS) CAPS 2 capsules daily 02/23/17   [place], Modena Nunnery, MD  lisinopril  (PRINIVIL,ZESTRIL) 10 MG tablet Take 1 tablet (10 mg total) by mouth daily. 02/21/17   Alycia Rossetti, MD  metFORMIN (GLUCOPHAGE-XR) 500 MG 24 hr tablet Take 1 tablet (500 mg total) by mouth every evening. 02/21/17   Alycia Rossetti, MD  pantoprazole (PROTONIX) 40 MG tablet TAKE ONE TABLET BY MOUTH bid 02/21/17   Alycia Rossetti, MD    Family History Family History  Problem Relation Age of Onset  . Diabetes Mother   . Hypertension Mother   . Heart disease Mother   . Hypertension Brother   . Colon cancer Neg Hx     Social History Social History   Tobacco Use  . Smoking status: Former Smoker    Packs/day: 0.30    Types: Cigarettes    Last attempt to quit: 04/01/2012    Years since quitting: 5.3  . Smokeless tobacco: Never Used  Substance Use Topics  . Alcohol use: Yes    Alcohol/week: 0.0 oz    Comment: Wine typically 2-3 glasses every few weekends  . Drug use: No     Allergies   Patient has no known allergies.   Review of Systems Review of Systems  Neurological: Negative for headaches.  All other systems reviewed and are negative.    Physical Exam Updated Vital Signs BP (!) 195/91 (BP Location: Right Arm)   Pulse 92   Temp 98.5 F (36.9 C) (Oral)   Resp 18   Ht 5\' 2"  (1.575 m)   Wt (!) 145.2 kg (320 lb)   SpO2 97%   BMI 58.53 kg/m   Physical Exam  Constitutional: She appears well-developed and well-nourished.  HENT:  Head: Normocephalic.  Eyes: Pupils are equal, round, and reactive to light.  Neck: Normal range of motion.  1.5 cm lynph node  Tender to touch   Cardiovascular: Normal rate.  Pulmonary/Chest: Effort normal.  Musculoskeletal: Normal range of motion.  Lymphadenopathy:    She has cervical adenopathy.  Neurological: She is alert.  Skin: Skin is warm.  Psychiatric: She has a normal mood and affect.  Nursing note and vitals reviewed.    ED Treatments / Results  Labs (all labs ordered are listed, but only abnormal results are  displayed) Labs Reviewed - No data to display  EKG None  Radiology No results found.  Procedures Procedures (including critical care time)  Medications Ordered in ED Medications - No data to display   Initial Impression / Assessment and Plan / ED Course  I have reviewed the triage vital signs and the nursing notes.  Pertinent labs & imaging results that were available during my care of the patient were reviewed by me and considered in my medical decision making (see chart for details).     MDM  Pt counseled on lymphadenopathy.   Pt advised to see her MD for recheck in 1 week   Final Clinical Impressions(s) / ED Diagnoses   Final diagnoses:  Lymphadenopathy    ED Discharge Orders  Ordered    cephALEXin (KEFLEX) 500 MG capsule  4 times daily     07/25/17 1852    An After Visit Summary was printed and given to the patient.    Sidney Ace 07/26/17 1604    Nat Christen, MD 07/27/17 1240

## 2017-08-25 ENCOUNTER — Other Ambulatory Visit (HOSPITAL_COMMUNITY): Payer: 59

## 2017-09-01 ENCOUNTER — Encounter (HOSPITAL_COMMUNITY): Admission: RE | Payer: Self-pay | Source: Ambulatory Visit

## 2017-09-01 ENCOUNTER — Ambulatory Visit (HOSPITAL_COMMUNITY): Admission: RE | Admit: 2017-09-01 | Payer: 59 | Source: Ambulatory Visit | Admitting: Ophthalmology

## 2017-09-01 ENCOUNTER — Encounter: Payer: 59 | Admitting: Family Medicine

## 2017-09-01 SURGERY — PHACOEMULSIFICATION, CATARACT, WITH IOL INSERTION
Anesthesia: Monitor Anesthesia Care | Laterality: Right

## 2017-09-08 DIAGNOSIS — J0410 Acute tracheitis without obstruction: Secondary | ICD-10-CM | POA: Insufficient documentation

## 2017-09-27 ENCOUNTER — Other Ambulatory Visit: Payer: Self-pay | Admitting: Family Medicine

## 2017-10-09 ENCOUNTER — Emergency Department (HOSPITAL_COMMUNITY)
Admission: EM | Admit: 2017-10-09 | Discharge: 2017-10-09 | Disposition: A | Discharge status: Home / Self Care | Payer: 59 | Attending: Emergency Medicine | Admitting: Emergency Medicine

## 2017-10-09 ENCOUNTER — Encounter (HOSPITAL_COMMUNITY): Payer: Self-pay | Admitting: Emergency Medicine

## 2017-10-09 ENCOUNTER — Other Ambulatory Visit: Payer: Self-pay

## 2017-10-09 ENCOUNTER — Emergency Department (HOSPITAL_COMMUNITY): Payer: 59

## 2017-10-09 DIAGNOSIS — Z7901 Long term (current) use of anticoagulants: Secondary | ICD-10-CM | POA: Insufficient documentation

## 2017-10-09 DIAGNOSIS — I10 Essential (primary) hypertension: Secondary | ICD-10-CM | POA: Insufficient documentation

## 2017-10-09 DIAGNOSIS — Z7984 Long term (current) use of oral hypoglycemic drugs: Secondary | ICD-10-CM | POA: Insufficient documentation

## 2017-10-09 DIAGNOSIS — E119 Type 2 diabetes mellitus without complications: Secondary | ICD-10-CM | POA: Insufficient documentation

## 2017-10-09 DIAGNOSIS — R51 Headache: Secondary | ICD-10-CM | POA: Diagnosis not present

## 2017-10-09 DIAGNOSIS — R079 Chest pain, unspecified: Secondary | ICD-10-CM | POA: Diagnosis present

## 2017-10-09 DIAGNOSIS — Z87891 Personal history of nicotine dependence: Secondary | ICD-10-CM | POA: Insufficient documentation

## 2017-10-09 DIAGNOSIS — R0789 Other chest pain: Secondary | ICD-10-CM | POA: Insufficient documentation

## 2017-10-09 DIAGNOSIS — Z79899 Other long term (current) drug therapy: Secondary | ICD-10-CM | POA: Diagnosis not present

## 2017-10-09 DIAGNOSIS — J3489 Other specified disorders of nose and nasal sinuses: Secondary | ICD-10-CM

## 2017-10-09 LAB — BASIC METABOLIC PANEL
Anion gap: 9 (ref 5–15)
BUN: 16 mg/dL (ref 6–20)
CHLORIDE: 104 mmol/L (ref 101–111)
CO2: 27 mmol/L (ref 22–32)
Calcium: 9.2 mg/dL (ref 8.9–10.3)
Creatinine, Ser: 0.87 mg/dL (ref 0.44–1.00)
GFR calc Af Amer: >60 mL/min (ref >60)
GFR calc non Af Amer: >60 mL/min (ref >60)
Glucose, Bld: 200 mg/dL — ABNORMAL HIGH (ref 65–99)
Potassium: 3.9 mmol/L (ref 3.5–5.1)
SODIUM: 140 mmol/L (ref 135–145)

## 2017-10-09 LAB — CBC
HCT: 35.7 % — ABNORMAL LOW (ref 36.0–46.0)
Hemoglobin: 10.6 g/dL — ABNORMAL LOW (ref 12.0–15.0)
MCH: 19.7 pg — AB (ref 26.0–34.0)
MCHC: 29.7 g/dL — ABNORMAL LOW (ref 30.0–36.0)
MCV: 66.2 fL — AB (ref 78.0–100.0)
PLATELETS: 321 10*3/uL (ref 150–400)
RBC: 5.39 MIL/uL — ABNORMAL HIGH (ref 3.87–5.11)
RDW: 18.4 % — AB (ref 11.5–15.5)
WBC: 11 10*3/uL — AB (ref 4.0–10.5)

## 2017-10-09 LAB — I-STAT TROPONIN, ED: Troponin i, poc: 0.01 ng/mL (ref 0.00–0.08)

## 2017-10-09 LAB — TROPONIN I: Troponin I: <0.03 ng/mL (ref <0.03)

## 2017-10-09 MED ORDER — ACETAMINOPHEN 325 MG PO TABS
650.0000 mg | ORAL_TABLET | Freq: Once | ORAL | Status: AC
  Administered 2017-10-09: 650 mg via ORAL
  Filled 2017-10-09: qty 2

## 2017-10-09 MED ORDER — CETIRIZINE-PSEUDOEPHEDRINE ER 5-120 MG PO TB12: 1.0000 | ORAL_TABLET | Freq: Every day | ORAL | 0 refills | Status: DC

## 2017-10-09 MED ORDER — FLUTICASONE PROPIONATE 50 MCG/ACT NA SUSP: 2.0000 | Freq: Every day | NASAL | 0 refills | Status: DC

## 2017-10-09 NOTE — ED Provider Notes (Signed)
[facility] EMERGENCY DEPARTMENT Provider Note   CSN: [id number] Arrival date & time: 10/09/17  1635     History   Chief Complaint Chief Complaint  Patient presents with  . Chest Pain    HPI Monique Gomez is a 62 y.o. female.  HPI This patient presents with chest pain.  He also has sinus pressure. She actually describes her discomfort as pressure-like, across the entire sternum, present for the past 48 hours, occurring without clear precipitant. Since onset it is been present, and today, with persistency, with mild generalized discomfort, mild dyspnea, she presents for evaluation. She does have a history of heartburn, states that this pain is atypical for that. She does not have a history of CAD, did have a catheterization a few years ago, she recalls as being unremarkable. She also has a history of hypertension, states that she takes her medication regularly. She does not drink, does not smoke, works as a Education officer, museum.  Past Medical History:  Diagnosis Date  . Allergic rhinitis   . Anemia   . Essential hypertension, benign   . GERD (gastroesophageal reflux disease)   . Headache(784.0)   . History of cardiac catheterization    No significant CAD May 2015  . History of migraine headaches   . Hyperlipidemia   . Obesity   . PAF (paroxysmal atrial fibrillation) ([place]) 08/2013  . Type 2 diabetes mellitus [facility])     Patient Active Problem List   Diagnosis Date Noted  . Screening for colon cancer 01/07/2015  . Hemorrhoids 11/25/2014  . Obstructive sleep apnea 10/14/2013  . Atrial fibrillation ([place]) 09/06/2013  . Lumbar back pain 09/06/2013  . NSTEMI (non-ST elevated myocardial infarction) ([place]) 09/03/2013  . MDD (major depressive disorder), single episode 02/04/2013  . Anxiety state, unspecified 02/04/2013  . Diabetes mellitus ([place]) 06/06/2012  . Carpal tunnel syndrome of left wrist 12/27/2011  . De Quervain's disease (tenosynovitis) 10/09/2011  . Heartburn  02/27/2011  . Allergic rhinitis 08/29/2010  . Anemia 02/15/2010  . Hyperlipidemia 12/23/2009  . Morbid obesity ([place]) 12/23/2009  . MIGRAINE HEADACHE 12/23/2009  . Essential hypertension, benign 12/23/2009    Past Surgical History:  Procedure Laterality Date  . CATARACT EXTRACTION    . COLONOSCOPY N/A 02/02/2015   SLF: 1. one colon polyp removed-no source for anemia identified. 2. moderate diverticulosis noted in the sigmoid colon and descending colon 3. the left colon is redundant 4. Rectal bleeding due ot small internal hemorroids 5. Moderate sized external hemorrhoids.  . ESOPHAGOGASTRODUODENOSCOPY N/A 02/02/2015   SLF: 1. 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 large hiatal hernia 3. mild non-erosive gastritis 4. No source for anemia identified.   Marland Kitchen LEFT HEART CATHETERIZATION WITH CORONARY ANGIOGRAM N/A 09/03/2013   Procedure: LEFT HEART CATHETERIZATION WITH CORONARY ANGIOGRAM;  Surgeon: Jettie Booze, MD;  Location: [facility] CATH LAB;  Service: Cardiovascular;  Laterality: N/A;  . Right carpal tunnel release       OB History   None      Home Medications    Prior to Admission medications   Medication Sig Start Date End Date Taking? Authorizing Provider  apixaban (ELIQUIS) 5 MG TABS tablet Take 1 tablet (5 mg total) by mouth 2 (two) times daily. 02/21/17  Yes [place], Modena Nunnery, MD  Aspirin-Salicylamide-Caffeine [facility] HEADACHE POWDER PO) Take 1 each by mouth every 6 (six) hours as needed (for pain).    Yes [provider]  atenolol (TENORMIN) 25 MG tablet TAKE 1 TABLET BY MOUTH ONCE  DAILY 09/28/17  Yes [place], Modena Nunnery, MD  atorvastatin (LIPITOR) 20 MG tablet Take 20 mg by mouth every evening.  09/28/17  Yes [provider]  Iron-FA-B Cmp-C-Biot-Probiotic (FUSION PLUS) CAPS 2 capsules daily Patient taking differently: Take 2 tablets by mouth daily.  02/23/17  Yes [place], Modena Nunnery, MD  lisinopril (PRINIVIL,ZESTRIL) 10 MG tablet Take 1 tablet (10 mg total)  by mouth daily. 02/21/17  Yes [place], Modena Nunnery, MD  metFORMIN (GLUCOPHAGE-XR) 500 MG 24 hr tablet Take 1 tablet (500 mg total) by mouth every evening. 02/21/17  Yes [employer], Modena Nunnery, MD  pantoprazole (PROTONIX) 40 MG tablet TAKE ONE TABLET BY MOUTH bid Patient taking differently: Take 40 mg by mouth 2 (two) times daily.  02/21/17  Yes [place], Modena Nunnery, MD  cephALEXin (KEFLEX) 500 MG capsule Take 1 capsule (500 mg total) by mouth 4 (four) times daily. Patient not taking: Reported on 10/09/2017 07/25/17   Sidney Ace    Family History Family History  Problem Relation Age of Onset  . Diabetes Mother   . Hypertension Mother   . Heart disease Mother   . Hypertension Brother   . Colon cancer Neg Hx     Social History Social History   Tobacco Use  . Smoking status: Former Smoker    Packs/day: 0.30    Types: Cigarettes    Last attempt to quit: 04/01/2012    Years since quitting: 5.5  . Smokeless tobacco: Never Used  Substance Use Topics  . Alcohol use: Yes    Alcohol/week: 0.0 oz    Comment: Wine typically 2-3 glasses every few weekends  . Drug use: No     Allergies   Patient has no known allergies.   Review of Systems Review of Systems  Constitutional:       Per HPI, otherwise negative  HENT:       Per HPI, otherwise negative  Respiratory:       Per HPI, otherwise negative  Cardiovascular:       Per HPI, otherwise negative  Gastrointestinal: Negative for vomiting.  Endocrine:       Negative aside from HPI  Genitourinary:       Neg aside from HPI   Musculoskeletal:       Per HPI, otherwise negative  Skin: Negative.   Neurological: Negative for syncope.     Physical Exam Updated Vital Signs BP 135/77   Pulse 69   Temp 98.6 F (37 C) (Oral)   Resp (!) 28   Ht 5\' 1"  (1.549 m)   Wt (!) 145.2 kg (320 lb)   SpO2 97%   BMI 60.46 kg/m   Physical Exam  Constitutional: She is oriented to person, place, and time. She appears well-developed and  well-nourished. No distress.  HENT:  Head: Normocephalic and atraumatic.  Eyes: Conjunctivae and EOM are normal.  Cardiovascular: Normal rate and regular rhythm.  Pulmonary/Chest: Effort normal and breath sounds normal. No stridor. No respiratory distress.  Abdominal: She exhibits no distension.  Musculoskeletal: She exhibits no edema.  Neurological: She is alert and oriented to person, place, and time. No cranial nerve deficit.  Skin: Skin is warm and dry.  Psychiatric: She has a normal mood and affect.  Nursing note and vitals reviewed.    ED Treatments / Results  Labs (all labs ordered are listed, but only abnormal results are displayed) Labs Reviewed  BASIC METABOLIC PANEL - Abnormal; Notable for the following components:  Result Value   Glucose, Bld 200 (*)    All other components within normal limits  CBC - Abnormal; Notable for the following components:   WBC 11.0 (*)    RBC 5.39 (*)    Hemoglobin 10.6 (*)    HCT 35.7 (*)    MCV 66.2 (*)    MCH 19.7 (*)    MCHC 29.7 (*)    RDW 18.4 (*)    All other components within normal limits  TROPONIN I  TROPONIN I  I-STAT TROPONIN, ED    EKG EKG Interpretation  Date/Time:  Monday October 09 2017 16:51:35 EDT Ventricular Rate:  84 PR Interval:    QRS Duration: 91 QT Interval:  363 QTC Calculation: 430 R Axis:   46 Text Interpretation:  Sinus rhythm T wave abnormality Abnormal ekg \ Confirmed by Carmin Muskrat [phone]) on 10/09/2017 4:56:40 PM   Radiology Dg Chest 2 View  Result Date: 10/09/2017 CLINICAL DATA:  Left-sided chest and arm pain for 2 days.  Cough. EXAM: CHEST - 2 VIEW COMPARISON:  04/30/2014 FINDINGS: Stable mild cardiomegaly. No evidence of pulmonary infiltrate or pleural effusion. IMPRESSION: Stable mild cardiomegaly.  No active lung disease. Electronically Signed   By: Earle Gell M.D.   On: 10/09/2017 17:45    Procedures Procedures (including critical care time)  Medications Ordered in  ED Medications - No data to display   Initial Impression / Assessment and Plan / ED Course  I have reviewed the triage vital signs and the nursing notes.  Pertinent labs & imaging results that were available during my care of the patient were reviewed by me and considered in my medical decision making (see chart for details).     8:44 PM Now, second troponin is normal, and on repeat exam she states that she feels okay. We discussed reassuring findings, and though she has a history of prior stent, with 2 normal troponin, nonischemic EKG, she does have T wave abnormalities, and ongoing use of Plavix, there is low suspicion for ongoing coronary ischemia. Patient has some persistent sinus pressure, will start a course of medication for this, but given the otherwise reassuring findings, she is appropriate for discharge with close outpatient follow-up.  Final Clinical Impressions(s) / ED Diagnoses  Atypical chest pain Sinus pressure   Carmin Muskrat, MD 10/09/17 2045

## 2017-10-09 NOTE — ED Triage Notes (Signed)
Patient complaining of chest heaviness with tingling to left arm since Saturday. Also complaining of headache x 2 days.

## 2017-10-09 NOTE — Discharge Instructions (Signed)
As discussed, your evaluation today has been largely reassuring.  But, it is important that you monitor your condition carefully, and do not hesitate to return to the ED if you develop new, or concerning changes in your condition. ? ?Otherwise, please follow-up with your physician for appropriate ongoing care. ? ?

## 2017-10-18 ENCOUNTER — Encounter: Payer: Self-pay | Admitting: Family Medicine

## 2017-10-18 ENCOUNTER — Ambulatory Visit: Payer: 59 | Admitting: Physician Assistant

## 2017-10-18 ENCOUNTER — Other Ambulatory Visit: Payer: Self-pay

## 2017-10-18 ENCOUNTER — Encounter: Payer: Self-pay | Admitting: Physician Assistant

## 2017-10-18 VITALS — BP 136/82 | HR 78 | Temp 98.1°F | Resp 16 | Ht 62.25 in | Wt 308.0 lb

## 2017-10-18 DIAGNOSIS — J320 Chronic maxillary sinusitis: Secondary | ICD-10-CM | POA: Diagnosis not present

## 2017-10-18 DIAGNOSIS — D509 Iron deficiency anemia, unspecified: Secondary | ICD-10-CM | POA: Diagnosis not present

## 2017-10-18 DIAGNOSIS — Z0000 Encounter for general adult medical examination without abnormal findings: Secondary | ICD-10-CM | POA: Diagnosis not present

## 2017-10-18 DIAGNOSIS — E119 Type 2 diabetes mellitus without complications: Secondary | ICD-10-CM

## 2017-10-18 DIAGNOSIS — E785 Hyperlipidemia, unspecified: Secondary | ICD-10-CM

## 2017-10-18 DIAGNOSIS — I10 Essential (primary) hypertension: Secondary | ICD-10-CM | POA: Diagnosis not present

## 2017-10-18 MED ORDER — PREDNISONE 20 MG PO TABS: ORAL_TABLET | ORAL | 0 refills | Status: DC

## 2017-10-18 NOTE — Progress Notes (Signed)
Patient ID: DORMA ALTMAN MRN: [id number], DOB: [birth year], 62 y.o. Date of Encounter: 10/18/2017,   Chief Complaint: Physical (CPE)  HPI: 62 y.o. y/o female  here for CPE.    She usually sees Dr. Buelah Manis but the schedule had a note that "needs CPE and cannot wait for Dr. Dorian Heckle schedule to be open for CPE"  She reports that she does want to cover preventive care and CPE but she also has some issues to address.  States that she has been having sinus pain and sinus pressure.  Points to maxillary sinus region bilaterally and across nasal bridge is area that she is feeling pressure and pain.  Says that she has gone to urgent care twice.  Says that she has been prescribed antibiotic but does not know the name of the antibiotic.  So at one visit was prescribed prednisone at another visit was prescribed Flonase.  Initially said that she had been given 2 rounds of prednisone each for 7 days but then later in the visit when I was discussing treatment plan of possibly going ahead and referring her to ENT she then said that she actually only took about 3 days of the prednisone each time and did not ever complete the 7-day course of prednisone.  Today she is agreeable to do a full course of prednisone prior to going to ENT.  Regarding preventive care states that she has a gynecologist that she sees routinely.  States that she sees Dr. Domenic Polite for cardiology and has appointment there June 26.    Review of Systems: Consitutional: No fever, chills, fatigue, night sweats, lymphadenopathy. No significant/unexplained weight changes. Eyes: No visual changes, eye redness, or discharge. ENT/Mouth: No ear pain, sore throat. ------------------SEE HPI------------------ Cardiovascular: No chest pressure,heaviness, tightness or squeezing, even with exertion. No increased shortness of breath or dyspnea on exertion.No palpitations, edema, orthopnea, PND. Respiratory: No cough, hemoptysis, SOB, or  wheezing. Gastrointestinal: No anorexia, dysphagia, reflux, pain, nausea, vomiting, hematemesis, diarrhea, constipation, BRBPR, or melena. Breast: No mass, nodules, bulging, or retraction. No skin changes or inflammation. No nipple discharge. No lymphadenopathy. Genitourinary: No dysuria, hematuria, incontinence, vaginal discharge, pruritis, burning, abnormal bleeding, or pain. Musculoskeletal: No decreased ROM, No joint pain or swelling. No significant pain in neck, back, or extremities. Skin: No rash, pruritis, or concerning lesions. Neurological: No headache, dizziness, syncope, seizures, tremors, memory loss, coordination problems, or paresthesias. Psychological: No anxiety, depression, hallucinations, SI/HI. Endocrine: No polydipsia, polyphagia, polyuria, or known diabetes.No increased fatigue. No palpitations/rapid heart rate. No significant/unexplained weight change. All other systems were reviewed and are otherwise negative.  Past Medical History:  Diagnosis Date  . Allergic rhinitis   . Anemia   . Essential hypertension, benign   . GERD (gastroesophageal reflux disease)   . Headache(784.0)   . History of cardiac catheterization    No significant CAD May 2015  . History of migraine headaches   . Hyperlipidemia   . Obesity   . PAF (paroxysmal atrial fibrillation) ([place]) 08/2013  . Type 2 diabetes mellitus ([place])      Past Surgical History:  Procedure Laterality Date  . CATARACT EXTRACTION    . COLONOSCOPY N/A 02/02/2015   SLF: 1. one colon polyp removed-no source for anemia identified. 2. moderate diverticulosis noted in the sigmoid colon and descending colon 3. the left colon is redundant 4. Rectal bleeding due ot small internal hemorroids 5. Moderate sized external hemorrhoids.  . ESOPHAGOGASTRODUODENOSCOPY N/A 02/02/2015   SLF: 1. 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 large hiatal hernia 3. mild non-erosive gastritis 4. No source for anemia identified.   Marland Kitchen LEFT  HEART CATHETERIZATION WITH CORONARY ANGIOGRAM N/A 09/03/2013   Procedure: LEFT HEART CATHETERIZATION WITH CORONARY ANGIOGRAM;  Surgeon: Jettie Booze, MD;  Location: [facility] CATH LAB;  Service: Cardiovascular;  Laterality: N/A;  . Right carpal tunnel release      Home Meds:  Outpatient Medications Prior to Visit  Medication Sig Dispense Refill  . apixaban (ELIQUIS) 5 MG TABS tablet Take 1 tablet (5 mg total) by mouth 2 (two) times daily. 180 tablet 3  . Aspirin-Salicylamide-Caffeine (BC HEADACHE POWDER PO) Take 1 each by mouth every 6 (six) hours as needed (for pain).     Marland Kitchen atenolol (TENORMIN) 25 MG tablet TAKE 1 TABLET BY MOUTH ONCE DAILY 90 tablet 0  . atorvastatin (LIPITOR) 20 MG tablet Take 20 mg by mouth every evening.   3  . cetirizine-pseudoephedrine (ZYRTEC-D) 5-120 MG tablet Take 1 tablet by mouth daily. 30 tablet 0  . Iron-FA-B Cmp-C-Biot-Probiotic (FUSION PLUS) CAPS 2 capsules daily (Patient taking differently: Take 2 tablets by mouth daily. ) 60 capsule 6  . lisinopril (PRINIVIL,ZESTRIL) 10 MG tablet Take 1 tablet (10 mg total) by mouth daily. 90 tablet 2  . metFORMIN (GLUCOPHAGE-XR) 500 MG 24 hr tablet Take 1 tablet (500 mg total) by mouth every evening. 90 tablet 3  . pantoprazole (PROTONIX) 40 MG tablet TAKE ONE TABLET BY MOUTH bid (Patient taking differently: Take 40 mg by mouth 2 (two) times daily. ) 180 tablet 3  . cephALEXin (KEFLEX) 500 MG capsule Take 1 capsule (500 mg total) by mouth 4 (four) times daily. 40 capsule 0  . fluticasone (FLONASE) 50 MCG/ACT nasal spray Place 2 sprays into both nostrils daily. 16 g 0   No facility-administered medications prior to visit.     Allergies: No Known Allergies  Social History   Socioeconomic History  . Marital status: Single    Spouse name: Not on file  . Number of children: Not on file  . Years of education: Not on file  . Highest education level: Not on file  Occupational History  . Occupation: Insurance risk surveyor: [employer]  . Financial resource strain: Not on file  . Food insecurity:    Worry: Not on file    Inability: Not on file  . Transportation needs:    Medical: Not on file    Non-medical: Not on file  Tobacco Use  . Smoking status: Former Smoker    Packs/day: 0.30    Types: Cigarettes    Last attempt to quit: 04/01/2012    Years since quitting: 5.5  . Smokeless tobacco: Never Used  Substance and Sexual Activity  . Alcohol use: Yes    Alcohol/week: 0.0 oz    Comment: Wine typically 2-3 glasses every few weekends  . Drug use: No  . Sexual activity: Yes  Lifestyle  . Physical activity:    Days per week: Not on file    Minutes per session: Not on file  . Stress: Not on file  Relationships  . Social connections:    Talks on phone: Not on file    Gets together: Not on file    Attends religious service: Not on file    Active member of club or organization: Not on file    Attends meetings of clubs or organizations: Not on file    Relationship status: Not on file  .  Intimate partner violence:    Fear of current or ex partner: Not on file    Emotionally abused: Not on file    Physically abused: Not on file    Forced sexual activity: Not on file  Other Topics Concern  . Not on file  Social History Narrative  . Not on file    Family History  Problem Relation Age of Onset  . Diabetes Mother   . Hypertension Mother   . Heart disease Mother   . Hypertension Brother   . Colon cancer Neg Hx     Physical Exam: Blood pressure 136/82, pulse 78, temperature 98.1 F (36.7 C), temperature source Oral, resp. rate 16, height 5' 2.25" (1.581 m), weight (!) 139.7 kg (308 lb), SpO2 98 %., Body mass index is 55.88 kg/m. General: Severely Obese AAF. Appears in no acute distress. HEENT: Normocephalic, atraumatic. Conjunctiva pink, sclera non-icteric. Pupils 2 mm constricting to 1 mm, round, regular, and equally reactive to light and accomodation. EOMI. Internal  auditory canal clear. TMs with good cone of light and without pathology. Nasal mucosa erythematous. Appears to have nasal polyps present bilaterally. Mild tenderness with percussion of maxillary sinuses bilaterally.  Pharynx appears normal with no erythema or exudate. Neck: Supple. Trachea midline. No thyromegaly. Full ROM. No lymphadenopathy.No Carotid Bruits. Lungs: Clear to auscultation bilaterally without wheezes, rales, or rhonchi. Breathing is of normal effort and unlabored. Cardiovascular: RRR with S1 S2. No murmurs, rubs, or gallops. Distal pulses 2+ symmetrically. No carotid or abdominal bruits. Breast: Per Gyn Abdomen: Soft, non-tender, non-distended with normoactive bowel sounds. No hepatosplenomegaly or masses. No rebound/guarding. No CVA tenderness. No hernias.  Genitourinary: Per Gyn Musculoskeletal: Full range of motion and 5/5 strength throughout.  Skin: Warm and moist without erythema, ecchymosis, wounds, or rash. Neuro: A+Ox3. CN II-XII grossly intact. Moves all extremities spontaneously. Full sensation throughout. Normal gait.  Psych:  Responds to questions appropriately with a normal affect.   Assessment/Plan:  62 y.o. y/o female here for CPE  1. Encounter for preventive health examination  A. Screening Labs: - CBC with Differential/Platelet; Future - COMPLETE METABOLIC PANEL WITH GFR; Future - Lipid panel; Future - TSH; Future - Hepatitis C antibody; Future - HIV antibody; Future  B. Pap: -Per Gyn C. Screening Mammogram: Per Gyn D. DEXA/BMD:  Wait until closer to age 63 E. Colorectal Cancer Screening: She had colonoscopy 02/02/2015.  Says that it did reveal some polyps but she is uncertain of when she is to have repeat colonoscopy.  She is to follow-up with GI regarding this.  I am unable to see this information in epic.  F. Immunizations:  Influenza:  N/A Tetanus: Td 12/23/2009 Pneumococcal: She received  Pneumovax 23 on 12/06/2013.  No further pneumonia vaccine  until age 44 Zostavax, Shingrix:  Discussed.  She is to check with her insurance regarding coverage and cost.  If she wants to get this then will receive at pharmacy.  2. Essential hypertension, benign Blood pressure is controlled.  New current medications.  Check lab to monitor. - COMPLETE METABOLIC PANEL WITH GFR; Future  3. Hyperlipidemia, unspecified hyperlipidemia type She is on Lipitor.  She will return fasting for labs to monitor. - COMPLETE METABOLIC PANEL WITH GFR; Future - Lipid panel; Future  4. Type 2 diabetes mellitus without complication, without long-term current use of insulin (HCC) We will recheck A1c to monitor. - COMPLETE METABOLIC PANEL WITH GFR; Future - Hemoglobin A1c; Future  5. Iron deficiency anemia, unspecified iron deficiency anemia type  Recheck CBC and iron to monitor. - CBC with Differential/Platelet; Future - Iron; Future  6. Chronic maxillary sinusitis Treat with a full round of prednisone.  If symptoms persist after this then would refer to ENT for further evaluation and treatment. - predniSONE (DELTASONE) 20 MG tablet; Take 3 daily for 2 days, then 2 daily for 2 days, then 1 daily for 2 days.  Dispense: 12 tablet; Refill: 0   Signed, [address] [place], [place], [facility] 10/18/2017 3:23 PM

## 2017-10-23 ENCOUNTER — Other Ambulatory Visit: Payer: 59

## 2017-10-23 DIAGNOSIS — D509 Iron deficiency anemia, unspecified: Secondary | ICD-10-CM

## 2017-10-23 DIAGNOSIS — I10 Essential (primary) hypertension: Secondary | ICD-10-CM

## 2017-10-23 DIAGNOSIS — Z0000 Encounter for general adult medical examination without abnormal findings: Secondary | ICD-10-CM

## 2017-10-23 DIAGNOSIS — E119 Type 2 diabetes mellitus without complications: Secondary | ICD-10-CM

## 2017-10-23 DIAGNOSIS — E785 Hyperlipidemia, unspecified: Secondary | ICD-10-CM

## 2017-10-24 LAB — COMPLETE METABOLIC PANEL WITH GFR
AG RATIO: 1.1 (calc) (ref 1.0–2.5)
ALKALINE PHOSPHATASE (APISO): 81 U/L (ref 33–130)
ALT: 7 U/L (ref 6–29)
AST: 12 U/L (ref 10–35)
Albumin: 3.7 g/dL (ref 3.6–5.1)
BILIRUBIN TOTAL: 0.2 mg/dL (ref 0.2–1.2)
BUN: 13 mg/dL (ref 7–25)
CHLORIDE: 104 mmol/L (ref 98–110)
CO2: 24 mmol/L (ref 20–32)
Calcium: 9.4 mg/dL (ref 8.6–10.4)
Creat: 0.77 mg/dL (ref 0.50–0.99)
GFR, Est African American: 97 mL/min/{1.73_m2} (ref >=60)
GFR, Est Non African American: 83 mL/min/{1.73_m2} (ref >=60)
GLUCOSE: 122 mg/dL — AB (ref 65–99)
Globulin: 3.5 g/dL (calc) (ref 1.9–3.7)
POTASSIUM: 4.8 mmol/L (ref 3.5–5.3)
Sodium: 141 mmol/L (ref 135–146)
TOTAL PROTEIN: 7.2 g/dL (ref 6.1–8.1)

## 2017-10-24 LAB — LIPID PANEL
CHOL/HDL RATIO: 4.1 (calc) (ref <5.0)
Cholesterol: 152 mg/dL (ref <200)
HDL: 37 mg/dL — AB (ref >50)
LDL Cholesterol (Calc): 97 mg/dL (calc)
NON-HDL CHOLESTEROL (CALC): 115 mg/dL (ref <130)
TRIGLYCERIDES: 85 mg/dL (ref <150)

## 2017-10-24 LAB — CBC WITH DIFFERENTIAL/PLATELET
BASOS ABS: 27 {cells}/uL (ref 0–200)
Basophils Relative: 0.3 %
EOS PCT: 2.3 %
Eosinophils Absolute: 207 cells/uL (ref 15–500)
HCT: 38.3 % (ref 35.0–45.0)
HEMOGLOBIN: 11 g/dL — AB (ref 11.7–15.5)
LYMPHS ABS: 1890 {cells}/uL (ref 850–3900)
MCH: 19.6 pg — AB (ref 27.0–33.0)
MCHC: 28.7 g/dL — ABNORMAL LOW (ref 32.0–36.0)
MCV: 68.4 fL — ABNORMAL LOW (ref 80.0–100.0)
MPV: 10.3 fL (ref 7.5–12.5)
Monocytes Relative: 7.4 %
NEUTROS ABS: 6210 {cells}/uL (ref 1500–7800)
Neutrophils Relative %: 69 %
PLATELETS: 313 10*3/uL (ref 140–400)
RBC: 5.6 10*6/uL — AB (ref 3.80–5.10)
RDW: 18.3 % — ABNORMAL HIGH (ref 11.0–15.0)
Total Lymphocyte: 21 %
WBC mixed population: 666 cells/uL (ref 200–950)
WBC: 9 10*3/uL (ref 3.8–10.8)

## 2017-10-24 LAB — HEMOGLOBIN A1C
EAG (MMOL/L): 8.4 (calc)
HEMOGLOBIN A1C: 6.9 %{Hb} — AB (ref <5.7)
Mean Plasma Glucose: 151 (calc)

## 2017-10-24 LAB — HEPATITIS C ANTIBODY
HEP C AB: NONREACTIVE
SIGNAL TO CUT-OFF: 0.04 (ref <1.00)

## 2017-10-24 LAB — HIV ANTIBODY (ROUTINE TESTING W REFLEX): HIV 1&2 Ab, 4th Generation: NONREACTIVE

## 2017-10-24 LAB — TSH: TSH: 3.53 mIU/L (ref 0.40–4.50)

## 2017-10-24 LAB — IRON: Iron: 32 ug/dL — ABNORMAL LOW (ref 45–160)

## 2017-10-25 ENCOUNTER — Encounter: Payer: Self-pay | Admitting: Student

## 2017-10-25 ENCOUNTER — Ambulatory Visit: Payer: 59 | Admitting: Student

## 2017-10-25 VITALS — BP 148/98 | HR 74 | Ht 62.0 in | Wt 323.0 lb

## 2017-10-25 DIAGNOSIS — I10 Essential (primary) hypertension: Secondary | ICD-10-CM | POA: Diagnosis not present

## 2017-10-25 DIAGNOSIS — R0789 Other chest pain: Secondary | ICD-10-CM | POA: Diagnosis not present

## 2017-10-25 DIAGNOSIS — E785 Hyperlipidemia, unspecified: Secondary | ICD-10-CM | POA: Diagnosis not present

## 2017-10-25 DIAGNOSIS — I480 Paroxysmal atrial fibrillation: Secondary | ICD-10-CM

## 2017-10-25 NOTE — Patient Instructions (Signed)
Medication Instructions:   Your physician recommends that you continue on your current medications as directed. Please refer to the Current Medication list given to you today.  Labwork:  NONE  Testing/Procedures:  NONE  Follow-Up:  Your physician recommends that you schedule a follow-up appointment in: 1 year. You will receive a reminder letter in the mail in about 12 months reminding you to call and schedule your appointment. If you don't receive this letter, please contact our office.  Any Other Special Instructions Will Be Listed Below (If Applicable).  If you need a refill on your cardiac medications before your next appointment, please call your pharmacy.

## 2017-10-25 NOTE — Progress Notes (Signed)
Cardiology Office Note    Date:  10/25/2017   ID:  Monique Gomez, DOB 08/02/1955, MRN [id number]  PCP:  Alycia Rossetti, MD  Cardiologist: Rozann Lesches, MD    Chief Complaint  Patient presents with  . Follow-up    overdue visit; recent Emergency Dept evaluation    History of Present Illness:    Monique Gomez is a 62 y.o. female with past medical history of PAF (on Eliquis for anticoagulation), HTN, HLD, Type 2 DM, and GERD who presents to the office today for overdue annual follow-up.  She was last examined by Dr. Domenic Polite in 08/2016 and denied any recent chest pain or dyspnea on exertion at that time. She was continued on her current medication regimen at that time including Eliquis for anticoagulation.  In the interim, she was recently evaluated at [facility] ED on 10/09/2017 for chest discomfort and a headache.  She describes this as a pressure which radiated across her entire sternum and had been present for the past 48 hours. Initial and delta troponin values were negative and her EKG showed no acute ischemic changes, therefore she was discharged and informed to follow-up with Cardiology and her PCP.  In talking with the patient today, she reports overall doing well since her recent ED visit. She denies any repeat episodes of chest discomfort and feels like anxiety might have been contributing to her presenting symptoms that day as she has been under increased stress over the past month due to the passing of her brother and mother.  She is active at her job and denies any recent exertional chest pain or dyspnea on exertion. No recent orthopnea, PND, lower extremity edema, or palpitations. She has been suffering from multiple sinus issues and has been on a steroid taper since last week which was prescribed by her PCP.  Remains on Eliquis for anticoagulation and denies any evidence of active bleeding.    Past Medical History:  Diagnosis Date  . Allergic rhinitis   .  Anemia   . Essential hypertension, benign   . GERD (gastroesophageal reflux disease)   . Headache(784.0)   . History of cardiac catheterization    No significant CAD May 2015  . History of migraine headaches   . Hyperlipidemia   . Obesity   . PAF (paroxysmal atrial fibrillation) ([place]) 08/2013  . Type 2 diabetes mellitus ([place])     Past Surgical History:  Procedure Laterality Date  . CATARACT EXTRACTION    . COLONOSCOPY N/A 02/02/2015   SLF: 1. one colon polyp removed-no source for anemia identified. 2. moderate diverticulosis noted in the sigmoid colon and descending colon 3. the left colon is redundant 4. Rectal bleeding due ot small internal hemorroids 5. Moderate sized external hemorrhoids.  . ESOPHAGOGASTRODUODENOSCOPY N/A 02/02/2015   SLF: 1. 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 large hiatal hernia 3. mild non-erosive gastritis 4. No source for anemia identified.   Marland Kitchen LEFT HEART CATHETERIZATION WITH CORONARY ANGIOGRAM N/A 09/03/2013   Procedure: LEFT HEART CATHETERIZATION WITH CORONARY ANGIOGRAM;  Surgeon: Jettie Booze, MD;  Location: [facility] CATH LAB;  Service: Cardiovascular;  Laterality: N/A;  . Right carpal tunnel release      Current Medications: Outpatient Medications Prior to Visit  Medication Sig Dispense Refill  . apixaban (ELIQUIS) 5 MG TABS tablet Take 1 tablet (5 mg total) by mouth 2 (two) times daily. 180 tablet 3  . Aspirin-Salicylamide-Caffeine (BC HEADACHE POWDER PO) Take 1 each by mouth every 6 (  six) hours as needed (for pain).     Marland Kitchen atenolol (TENORMIN) 25 MG tablet TAKE 1 TABLET BY MOUTH ONCE DAILY 90 tablet 0  . atorvastatin (LIPITOR) 20 MG tablet Take 20 mg by mouth every evening.   3  . Iron-FA-B Cmp-C-Biot-Probiotic (FUSION PLUS) CAPS 2 capsules daily (Patient taking differently: Take 2 tablets by mouth daily. ) 60 capsule 6  . lisinopril (PRINIVIL,ZESTRIL) 10 MG tablet Take 1 tablet (10 mg total) by mouth daily. 90 tablet 2  . metFORMIN  (GLUCOPHAGE-XR) 500 MG 24 hr tablet Take 1 tablet (500 mg total) by mouth every evening. 90 tablet 3  . pantoprazole (PROTONIX) 40 MG tablet TAKE ONE TABLET BY MOUTH bid (Patient taking differently: Take 40 mg by mouth 2 (two) times daily. ) 180 tablet 3  . predniSONE (DELTASONE) 20 MG tablet Take 3 daily for 2 days, then 2 daily for 2 days, then 1 daily for 2 days. 12 tablet 0  . cetirizine-pseudoephedrine (ZYRTEC-D) 5-120 MG tablet Take 1 tablet by mouth daily. 30 tablet 0   No facility-administered medications prior to visit.      Allergies:   Patient has no known allergies.   Social History   Socioeconomic History  . Marital status: Single    Spouse name: Not on file  . Number of children: Not on file  . Years of education: Not on file  . Highest education level: Not on file  Occupational History  . Occupation: Training and development officer: [employer]  . Financial resource strain: Not on file  . Food insecurity:    Worry: Not on file    Inability: Not on file  . Transportation needs:    Medical: Not on file    Non-medical: Not on file  Tobacco Use  . Smoking status: Former Smoker    Packs/day: 0.30    Types: Cigarettes    Last attempt to quit: 04/01/2012    Years since quitting: 5.5  . Smokeless tobacco: Never Used  Substance and Sexual Activity  . Alcohol use: Yes    Alcohol/week: 0.0 oz    Comment: Wine typically 2-3 glasses every few weekends  . Drug use: No  . Sexual activity: Yes  Lifestyle  . Physical activity:    Days per week: Not on file    Minutes per session: Not on file  . Stress: Not on file  Relationships  . Social connections:    Talks on phone: Not on file    Gets together: Not on file    Attends religious service: Not on file    Active member of club or organization: Not on file    Attends meetings of clubs or organizations: Not on file    Relationship status: Not on file  Other Topics Concern  . Not on file  Social History  Narrative  . Not on file     Family History:  The patient's family history includes Diabetes in her mother; Heart disease in her mother; Hypertension in her brother and mother.   Review of Systems:   Please see the history of present illness.     General:  No chills, fever, night sweats or weight changes.  Cardiovascular:  No dyspnea on exertion, edema, orthopnea, palpitations, paroxysmal nocturnal dyspnea. Positive for chest pain (now resolved).  Dermatological: No rash, lesions/masses Respiratory: No cough, dyspnea Urologic: No hematuria, dysuria Abdominal:   No nausea, vomiting, diarrhea, bright red blood per rectum, melena, or  hematemesis Neurologic:  No visual changes, wkns, changes in mental status. All other systems reviewed and are otherwise negative except as noted above.   Physical Exam:    VS:  BP (!) 148/98   Pulse 74   Ht 5' 2"  (1.575 m)   Wt (!) 323 lb (146.5 kg)   SpO2 97%   BMI 59.08 kg/m    General: Well developed, obese African American female appearing in no acute distress. Head: Normocephalic, atraumatic, sclera non-icteric, no xanthomas, nares are without discharge.  Neck: No carotid bruits. JVD not elevated.  Lungs: Respirations regular and unlabored, without wheezes or rales.  Heart: Regular rate and rhythm. No S3 or S4.  No murmur, no rubs, or gallops appreciated. Abdomen: Soft, non-tender, non-distended with normoactive bowel sounds. No hepatomegaly. No rebound/guarding. No obvious abdominal masses. Msk:  Strength and tone appear normal for age. No joint deformities or effusions. Extremities: No clubbing or cyanosis. No lower extremity edema.  Distal pedal pulses are 2+ bilaterally. Neuro: Alert and oriented X 3. Moves all extremities spontaneously. No focal deficits noted. Psych:  Responds to questions appropriately with a normal affect. Skin: No rashes or lesions noted  Wt Readings from Last 3 Encounters:  10/25/17 (!) 323 lb (146.5 kg)  10/18/17  (!) 308 lb (139.7 kg)  10/09/17 (!) 320 lb (145.2 kg)     Studies/Labs Reviewed:   EKG:  EKG is not ordered today. EKG from 10/09/2017 is reviewed which shows NSR, HR 84, with LVH and diffuse TWI (similar to prior tracings from 2016 and 2018).   Recent Labs: 10/23/2017: ALT 7; BUN 13; Creat 0.77; Hemoglobin 11.0; Platelets 313; Potassium 4.8; Sodium 141; TSH 3.53   Lipid Panel    Component Value Date/Time   CHOL 152 10/23/2017 0811   TRIG 85 10/23/2017 0811   HDL 37 (L) 10/23/2017 0811   CHOLHDL 4.1 10/23/2017 0811   VLDL 13 08/20/2015 0935   LDLCALC 97 10/23/2017 0811    Additional studies/ records that were reviewed today include:   Echocardiogram: 08/2013 Study Conclusions  - Left ventricle: The cavity size was normal. Wall thickness was increased in a pattern of mild to moderateLVH. Systolic function was normal. The estimated ejection fraction was in the range of 60% to 65%. Wall motion was normal; there were no regional wall motion abnormalities. The study is not technically sufficient to allow evaluation of LV diastolic function. - Aortic valve: Mildly calcified annulus. Trileaflet. No significant regurgitation. - Mitral valve: Calcified annulus. Mild regurgitation. - Left atrium: The atrium was at the upper limits of normal in size. - Right ventricle: The cavity size was mildly dilated. - Right atrium: Central venous pressure: 30m Hg (est). - Atrial septum: No defect or patent foramen ovale was identified. - Tricuspid valve: Mild regurgitation. - Pulmonary arteries: PA peak pressure: 322mHg (S). - Pericardium, extracardiac: There was no pericardial effusion. Impressions:  - Mild to moderate LVH with LVEF 60-65%. Indeterminate diastolic function. Upper normal left atrial size. MAC with mild mitral regurgitation. Mild RV enlargement. Mild tricuspid regurgitation with PASP 33 mmHg.  Assessment:    1. Atypical chest pain   2.  Paroxysmal atrial fibrillation (HCC)   3. Essential hypertension   4. Hyperlipidemia, unspecified hyperlipidemia type      Plan:   In order of problems listed above:  1. Atypical Chest Pain - recently presented to the ED for evaluation of chest pain which had been constant for over 48 hours. Initial and delta troponin values  were negative and her EKG showed no acute ischemic changes. The patient feels like stress was playing a major role as two family members had recently passed away.  - she denies any recurrent chest pain or dyspnea on exertion. Would not pursue further ischemic evaluation at this time. If she developed recurrent symptoms, can consider stress testing. Would need to be a 2-day study given her body habitus.   2. Paroxysmal Atrial Fibrillation - denies any recent palpitations. Maintaining NSR by recent EKG earlier this month and by examination today. Continue Atenolol for rate-control. - denies any evidence of active bleeding. Remains on Eliquis for anticoagulation.   3. HTN - BP initially elevated to 160/102, improved to 148/98 on recheck. Reports this was in the 130's/80's when checked by her PCP last week but she was started on a steroid taper in the interim due to a sinus infection.  - recommended she continue to follow BP in the ambulatory setting. She is currently on Atenolol 61m daily along with Lisinopril 13mdaily and we could further titrate Lisinopril if BP remains elevated.   4. HLD - followed by PCP. FLP on 10/23/2017 showed total cholesterol of 152, HDL 37, and LDL 97. Remains on Atorvastatin 202maily.   Medication Adjustments/Labs and Tests Ordered: Current medicines are reviewed at length with the patient today.  Concerns regarding medicines are outlined above.  Medication changes, Labs and Tests ordered today are listed in the Patient Instructions below. Patient Instructions  Medication Instructions:   Your physician recommends that you continue on  your current medications as directed. Please refer to the Current Medication list given to you today.  Labwork:  NONE  Testing/Procedures:  NONE  Follow-Up:  Your physician recommends that you schedule a follow-up appointment in: 1 year. You will receive a reminder letter in the mail in about 12 months reminding you to call and schedule your appointment. If you don't receive this letter, please contact our office.  Any Other Special Instructions Will Be Listed Below (If Applicable).  If you need a refill on your cardiac medications before your next appointment, please call your pharmacy.    Signed, BriErma HeritageA-C  10/25/2017 8:22 PM    [place] [address] [id number]: [phone]

## 2017-10-27 ENCOUNTER — Other Ambulatory Visit: Payer: Self-pay

## 2017-10-27 MED ORDER — FERROUS SULFATE 325 (65 FE) MG PO TABS: 325.0000 mg | ORAL_TABLET | Freq: Three times a day (TID) | ORAL | 11 refills | Status: DC

## 2017-11-22 ENCOUNTER — Ambulatory Visit: Payer: 59 | Admitting: Orthopedic Surgery

## 2017-11-22 ENCOUNTER — Encounter: Payer: Self-pay | Admitting: Orthopedic Surgery

## 2017-11-22 VITALS — BP 178/88 | HR 68 | Ht 62.0 in | Wt 319.0 lb

## 2017-11-22 DIAGNOSIS — M65331 Trigger finger, right middle finger: Secondary | ICD-10-CM | POA: Diagnosis not present

## 2017-11-22 DIAGNOSIS — Z6841 Body Mass Index (BMI) 40.0 and over, adult: Secondary | ICD-10-CM | POA: Diagnosis not present

## 2017-11-22 NOTE — Progress Notes (Addendum)
Chief Complaint  Patient presents with  . Hand Pain    bilateral     BP (!) 178/88   Pulse 68   Ht 5\' 2"  (1.575 m)   Wt (!) 319 lb (144.7 kg)   BMI 58.69 kg/m   62 year old female complains of pain right hand with pain tenderness and metacarpal phalangeal joint of the right middle/long finger  Review of systems denies numbness or tingling  Vitals recorded.  Physical Exam  Constitutional: She is oriented to person, place, and time. She appears well-developed and well-nourished.  Neurological: She is alert and oriented to person, place, and time.  Psychiatric: She has a normal mood and affect. Judgment normal.  Vitals reviewed.  Right long finger middle finger tenderness in the MCP joint swelling with decreased range of motion tenderness at the A1 pulley as well.  Neurovascular exam is intact strength of the flexor tendons normal there is no deformity there is no instability of the joint valgus stress was placed at the MCP joint that was normal.  Skin warm dry and intact.  Color capillary refill normal.  Encounter Diagnoses  Name Primary?  . Trigger finger, right middle finger Yes  . Body mass index 50.0-59.9, adult ([place])   . Morbid obesity ([place])    The patient meets the AMA guidelines for Morbid (severe) obesity with a BMI > 40.0 and I have recommended weight loss.  Trigger finger injection  Diagnosis right long middle finger Procedure injection A1 pulley Medications lidocaine 1% 1 mL and Depo-Medrol 40 mg 1 mL Skin prep alcohol and ethyl chloride Verbal consent was obtained Timeout confirmed the injection site  After cleaning the skin with alcohol and anesthetizing the skin with ethyl chloride the A1 pulley was palpated and the injection was performed without complication

## 2017-12-12 ENCOUNTER — Other Ambulatory Visit: Payer: Self-pay | Admitting: Family Medicine

## 2017-12-12 DIAGNOSIS — I10 Essential (primary) hypertension: Secondary | ICD-10-CM

## 2017-12-25 ENCOUNTER — Telehealth: Payer: Self-pay | Admitting: Radiology

## 2017-12-25 ENCOUNTER — Ambulatory Visit: Payer: 59 | Admitting: Orthopedic Surgery

## 2017-12-25 ENCOUNTER — Encounter: Payer: Self-pay | Admitting: Orthopedic Surgery

## 2017-12-25 ENCOUNTER — Ambulatory Visit (INDEPENDENT_AMBULATORY_CARE_PROVIDER_SITE_OTHER): Payer: 59

## 2017-12-25 VITALS — BP 176/86 | HR 65 | Ht 62.0 in | Wt 320.0 lb

## 2017-12-25 DIAGNOSIS — M25531 Pain in right wrist: Secondary | ICD-10-CM | POA: Diagnosis not present

## 2017-12-25 DIAGNOSIS — Z6841 Body Mass Index (BMI) 40.0 and over, adult: Secondary | ICD-10-CM

## 2017-12-25 DIAGNOSIS — M659 Synovitis and tenosynovitis, unspecified: Secondary | ICD-10-CM | POA: Diagnosis not present

## 2017-12-25 MED ORDER — DICLOFENAC SODIUM 1 % TD GEL: 2.0000 g | Freq: Four times a day (QID) | TRANSDERMAL | 5 refills | Status: DC

## 2017-12-25 NOTE — Progress Notes (Addendum)
Chief Complaint  Patient presents with  . Wrist Pain    right     62 year old female with chronic morbid obesity on Eliquis presents with a 2-week history of right wrist pain no trauma complains of dorsal wrist pain pain with wrist extension describes a dull aching pain seems to be worse with use no history of trauma no prior treatment     Review of Systems  Constitutional: Negative for fever.  HENT: Positive for congestion.   Respiratory: Positive for shortness of breath.   Skin: Negative for rash.  Neurological: Negative for tingling and weakness.   Past Medical History:  Diagnosis Date  . Allergic rhinitis   . Anemia   . Essential hypertension, benign   . GERD (gastroesophageal reflux disease)   . Headache(784.0)   . History of cardiac catheterization    No significant CAD May 2015  . History of migraine headaches   . Hyperlipidemia   . Obesity   . PAF (paroxysmal atrial fibrillation) ([place]) 08/2013  . Type 2 diabetes mellitus ([place])    Past Surgical History:  Procedure Laterality Date  . CATARACT EXTRACTION    . COLONOSCOPY N/A 02/02/2015   SLF: 1. one colon polyp removed-no source for anemia identified. 2. moderate diverticulosis noted in the sigmoid colon and descending colon 3. the left colon is redundant 4. Rectal bleeding due ot small internal hemorroids 5. Moderate sized external hemorrhoids.  . ESOPHAGOGASTRODUODENOSCOPY N/A 02/02/2015   SLF: 1. 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 large hiatal hernia 3. mild non-erosive gastritis 4. No source for anemia identified.   [facility] LEFT HEART CATHETERIZATION WITH CORONARY ANGIOGRAM N/A 09/03/2013   Procedure: LEFT HEART CATHETERIZATION WITH CORONARY ANGIOGRAM;  Surgeon: Jettie Booze, MD;  Location: [facility] CATH LAB;  Service: Cardiovascular;  Laterality: N/A;  . 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  Encounter Diagnoses  Name Primary?  . Right wrist pain Yes  . Synovitis of wrist   . Body mass index 40.0-44.9, adult ([place])   . Morbid obesity ([facility])     Recommend   Voltaren gel secondary to patient being on Eliquis  Wrist brace for 2 weeks  Return 4 weeks

## 2017-12-25 NOTE — Patient Instructions (Signed)
Apply voltaren gel 4 times a day 4 weeks   Wear brace 2 weeks

## 2017-12-25 NOTE — Telephone Encounter (Signed)
Completed the prior auth request for voltaren gel on cover my meds.

## 2017-12-26 ENCOUNTER — Other Ambulatory Visit: Payer: Self-pay | Admitting: Family Medicine

## 2017-12-27 ENCOUNTER — Telehealth: Payer: Self-pay | Admitting: Radiology

## 2017-12-27 NOTE — Telephone Encounter (Signed)
Her insurance denied the diclofenac gel, I have sent them an appeal she is on Eliquis can not use oral NSAIDS

## 2018-01-02 ENCOUNTER — Other Ambulatory Visit: Payer: 59 | Admitting: Obstetrics & Gynecology

## 2018-01-22 ENCOUNTER — Ambulatory Visit: Payer: 59 | Admitting: Orthopedic Surgery

## 2018-01-26 ENCOUNTER — Other Ambulatory Visit: Payer: 59 | Admitting: Obstetrics & Gynecology

## 2018-02-05 ENCOUNTER — Other Ambulatory Visit: Payer: Self-pay | Admitting: Family Medicine

## 2018-02-05 DIAGNOSIS — K219 Gastro-esophageal reflux disease without esophagitis: Secondary | ICD-10-CM

## 2018-02-26 ENCOUNTER — Other Ambulatory Visit: Payer: Self-pay | Admitting: Family Medicine

## 2018-03-05 ENCOUNTER — Other Ambulatory Visit: Payer: Self-pay | Admitting: Family Medicine

## 2018-06-17 DIAGNOSIS — R42 Dizziness and giddiness: Secondary | ICD-10-CM | POA: Diagnosis not present

## 2018-06-17 DIAGNOSIS — J09X2 Influenza due to identified novel influenza A virus with other respiratory manifestations: Secondary | ICD-10-CM | POA: Diagnosis not present

## 2018-06-17 DIAGNOSIS — R6889 Other general symptoms and signs: Secondary | ICD-10-CM | POA: Diagnosis not present

## 2018-08-13 ENCOUNTER — Ambulatory Visit (INDEPENDENT_AMBULATORY_CARE_PROVIDER_SITE_OTHER): Payer: BC Managed Care – PPO | Admitting: Family Medicine

## 2018-08-13 ENCOUNTER — Encounter: Payer: Self-pay | Admitting: Family Medicine

## 2018-08-13 ENCOUNTER — Other Ambulatory Visit: Payer: Self-pay

## 2018-08-13 ENCOUNTER — Telehealth: Payer: Self-pay | Admitting: Cardiology

## 2018-08-13 DIAGNOSIS — I10 Essential (primary) hypertension: Secondary | ICD-10-CM

## 2018-08-13 DIAGNOSIS — D509 Iron deficiency anemia, unspecified: Secondary | ICD-10-CM

## 2018-08-13 DIAGNOSIS — E119 Type 2 diabetes mellitus without complications: Secondary | ICD-10-CM

## 2018-08-13 DIAGNOSIS — E785 Hyperlipidemia, unspecified: Secondary | ICD-10-CM

## 2018-08-13 DIAGNOSIS — M25512 Pain in left shoulder: Secondary | ICD-10-CM | POA: Diagnosis not present

## 2018-08-13 MED ORDER — METHYLPREDNISOLONE 4 MG PO TBPK: ORAL_TABLET | ORAL | 0 refills | Status: DC

## 2018-08-13 NOTE — Telephone Encounter (Signed)
Patient reports tingling in left shoulder and fingers except thumb on left hand since Friday. Denies CP,denies injury Says she was told in the past it may be a pinched nerve. She is going to call pcp to discuss

## 2018-08-13 NOTE — Telephone Encounter (Signed)
Patient called stating that she has been having tingling in left arm, fingers and in her left shoulder off and on since Friday .

## 2018-08-13 NOTE — Progress Notes (Signed)
Virtual Visit via Telephone Note  I connected with Monique Gomez on 08/13/18 at 2:57pm by telephone and verified that I am speaking with the correct person using two identifiers.   Pt location:at home   Physician location: in office, [facility], Vic Blackbird MD      I discussed the limitations, risks, security and privacy concerns of performing an evaluation and management service by telephone and the availability of in person appointments. I also discussed with the patient that there may be a patient responsible charge related to this service. The patient expressed understanding and agreed to proceed.   History of Present Illness:  Left shoulder pain with tingling and numbness since Friday. She was cleaning out her pantry.starting having symptoms afterwards, no cough, no chest pain, no SOB, no neck pain she also talked to her cardiologist about his he advised that this was noncardiac not more musculoskeletal recommended that she contact me.  She was also told to try taking an anti-inflammatory however she is on Eliquis.  She did take Tylenol which helps some along with the use of a heating pad.   DM- she is not taking her blood sugar, not missing any medications per report she is way overdue for labs last done in June 2019  GERD- taking protonix twice a day   Anemia- she is back on the iron tablets, taking three times a day    Weight at home now 297lbs   Atrial fibrillation maintained by cardiology she is on Eliquis she has not had any chest pain or arrhythmia recently   Observations/Objective: Telephone visit  Assessment and Plan: Seasonal allergies- claritin or zyrtec can take OTC  DM-we will come in this Wednesday for fasting labs she needs A1c as well as renal function.  For now she will continue the metformin 500 mg extended release.  We will send in a new meter as long as well as test strips for her to test once a day.  Need to work on low-carb low sugar  diet  Hyperlipidemia-she is on Lipitor will have fasting labs done this week  Atrial fibrillation maintained by cardiology she is on atenolol as well as Eliquis without any active bleeding  Anemia continue with iron supplementation  Left shoulder pain with radicular symptoms- possible pinched nerve, acute symptoms, improved with heating pad, given medrol dosepak, unable to take NSAIDS   Follow Up Instructions:    I discussed the assessment and treatment plan with the patient. The patient was provided an opportunity to ask questions and all were answered. The patient agreed with the plan and demonstrated an understanding of the instructions.   The patient was advised to call back or seek an in-person evaluation if the symptoms worsen or if the condition fails to improve as anticipated.  I provided 12  minutes of non-face-to-face time during this encounter. End time 3:09  Vic Blackbird, MD

## 2018-08-14 ENCOUNTER — Telehealth: Payer: Self-pay | Admitting: *Deleted

## 2018-08-14 MED ORDER — BLOOD GLUCOSE SYSTEM PAK KIT: PACK | 1 refills | Status: DC

## 2018-08-14 MED ORDER — BLOOD GLUCOSE TEST VI STRP: ORAL_STRIP | 1 refills | Status: DC

## 2018-08-14 MED ORDER — LANCETS MISC: 1 refills | Status: DC

## 2018-08-14 NOTE — Telephone Encounter (Signed)
-----   Message from Alycia Rossetti, MD sent at 08/13/2018  3:10 PM EDT ----- Regarding: Send over new meter, test strips, test once a day ,lancets

## 2018-08-14 NOTE — Telephone Encounter (Signed)
Prescription sent to pharmacy.

## 2018-09-07 ENCOUNTER — Other Ambulatory Visit: Payer: Self-pay | Admitting: Family Medicine

## 2018-09-07 DIAGNOSIS — I10 Essential (primary) hypertension: Secondary | ICD-10-CM

## 2018-09-25 ENCOUNTER — Ambulatory Visit (INDEPENDENT_AMBULATORY_CARE_PROVIDER_SITE_OTHER): Payer: BC Managed Care – PPO | Admitting: Family Medicine

## 2018-09-25 ENCOUNTER — Encounter: Payer: Self-pay | Admitting: Family Medicine

## 2018-09-25 ENCOUNTER — Other Ambulatory Visit: Payer: Self-pay

## 2018-09-25 DIAGNOSIS — J0100 Acute maxillary sinusitis, unspecified: Secondary | ICD-10-CM

## 2018-09-25 MED ORDER — FLUTICASONE PROPIONATE 50 MCG/ACT NA SUSP: 2.0000 | Freq: Every day | NASAL | 6 refills | Status: DC

## 2018-09-25 MED ORDER — AMOXICILLIN 875 MG PO TABS: 875.0000 mg | ORAL_TABLET | Freq: Two times a day (BID) | ORAL | 0 refills | Status: DC

## 2018-09-25 MED ORDER — TRAMADOL HCL 50 MG PO TABS: 50.0000 mg | ORAL_TABLET | Freq: Three times a day (TID) | ORAL | 0 refills | Status: AC | PRN

## 2018-09-25 NOTE — Progress Notes (Signed)
Virtual Visit via Telephone Note  I connected with Monique Gomez on 09/25/18 at 12:10 pm by telephone and verified that I am speaking with the correct person using two identifiers.     Pt location: at home   Physician location:  In office, [facility], Vic Blackbird MD     On call: patient and physician   I discussed the limitations, risks, security and privacy concerns of performing an evaluation and management service by telephone and the availability of in person appointments. I also discussed with the patient that there may be a patient responsible charge related to this service. The patient expressed understanding and agreed to proceed.   History of Present Illness: Has had sinus pressure and drainage for the past couple of weeks. Has severe headache with dizziness,if she presses on bridge of nose and face it helps relief it some Taking OTC meds  No fever, no cough, no vision changes no nausea vomiting associated  She gets HIGH ANXIETY, coming into the office right now due to [place] -19, she nevere came for her blood draw due to anxiety    Observations/Objective: Telephone   Assessment and Plan: Acute Sinuistis-treat with amoxicillin, Flonase continue her allergy medication.  Have also given her tramadol for headache.  No red flags no fever.    Follow Up Instructions:    I discussed the assessment and treatment plan with the patient. The patient was provided an opportunity to ask questions and all were answered. The patient agreed with the plan and demonstrated an understanding of the instructions.   The patient was advised to call back or seek an in-person evaluation if the symptoms worsen or if the condition fails to improve as anticipated.  I provided 5 minutes of non-face-to-face time during this encounter. End Time 12:15pm  Vic Blackbird, MD

## 2018-09-27 ENCOUNTER — Other Ambulatory Visit: Payer: Self-pay

## 2018-10-08 ENCOUNTER — Telehealth: Payer: Self-pay | Admitting: *Deleted

## 2018-10-08 NOTE — Telephone Encounter (Signed)
Received call from patient.   Reports that she continues to have sinus pressure and drainage. Reports that she has severe HA. Requested OV with PCP.   Denies fever, cough, SOB. Ok to schedule in office?

## 2018-10-08 NOTE — Telephone Encounter (Signed)
Pt needs OV 

## 2018-10-08 NOTE — Telephone Encounter (Signed)
Call placed to patient and patient made aware.   Appointment scheduled.  

## 2018-10-09 ENCOUNTER — Ambulatory Visit: Payer: BC Managed Care – PPO | Admitting: Family Medicine

## 2018-10-09 ENCOUNTER — Encounter: Payer: Self-pay | Admitting: Family Medicine

## 2018-10-09 ENCOUNTER — Other Ambulatory Visit: Payer: Self-pay

## 2018-10-09 VITALS — BP 142/98 | HR 75 | Temp 98.3°F | Resp 16 | Ht 62.0 in | Wt 304.5 lb

## 2018-10-09 DIAGNOSIS — D509 Iron deficiency anemia, unspecified: Secondary | ICD-10-CM | POA: Diagnosis not present

## 2018-10-09 DIAGNOSIS — E785 Hyperlipidemia, unspecified: Secondary | ICD-10-CM | POA: Diagnosis not present

## 2018-10-09 DIAGNOSIS — E119 Type 2 diabetes mellitus without complications: Secondary | ICD-10-CM

## 2018-10-09 DIAGNOSIS — J019 Acute sinusitis, unspecified: Secondary | ICD-10-CM

## 2018-10-09 DIAGNOSIS — G43709 Chronic migraine without aura, not intractable, without status migrainosus: Secondary | ICD-10-CM

## 2018-10-09 DIAGNOSIS — I10 Essential (primary) hypertension: Secondary | ICD-10-CM | POA: Diagnosis not present

## 2018-10-09 MED ORDER — AMOXICILLIN-POT CLAVULANATE 875-125 MG PO TABS: 1.0000 | ORAL_TABLET | Freq: Two times a day (BID) | ORAL | 0 refills | Status: DC

## 2018-10-09 MED ORDER — PREDNISONE 20 MG PO TABS: 20.0000 mg | ORAL_TABLET | Freq: Every day | ORAL | 0 refills | Status: DC

## 2018-10-09 NOTE — Assessment & Plan Note (Signed)
He does have underlying migraines but also with a sinus infection today.  We will treat this and see how her headaches do.  She can use a half a tramadol or acetaminophen as needed her headaches do not improve after treatment for the sinusitis still having behind the temples top of head will need imaging

## 2018-10-09 NOTE — Progress Notes (Signed)
Subjective:    Patient ID: Monique Gomez, female    DOB: 1955/11/12, 63 y.o.   MRN: [id number]  Patient presents for Sinusitis  Patient here with ongoing sinus issues.  She had a telephone visit on 5/26 in [place] to sinus pressure and drainage for the past couple weeks she had already tried allergy medications which we had discussed before.  She was treated with amoxicillin for 10 days also advised to use Flonase and continue her allergy medication.  She was also given tramadol for headache associated.   SInuses draining small amount, has pain beneath eyes,on temples, on top of head. Feels dizzy and nauseous Has daily headache and pressure. She tried steam , Flonase makes it worse  She took 1 ultram and slept most of day  She has been taking mucinex sinus max every day   HTN- at home her readings, 130-140/80-90's      Per telephone visit she is due for her fasting labs for her A1c lipid panel she also has known iron deficiency anemia and due for those labs as well.  These will be done today.   Review Of Systems:  GEN- denies fatigue, fever, weight loss,weakness, recent illness HEENT- denies eye drainage, change in vision,+ nasal discharge, CVS- denies chest pain, palpitations RESP- denies SOB, cough, wheeze ABD- denies N/V, change in stools, abd pain GU- denies dysuria, hematuria, dribbling, incontinence MSK- denies joint pain, muscle aches, injury Neuro-+headache, +dizziness, syncope, seizure activity       Objective:    BP (!) 142/98   Pulse 75   Temp 98.3 F (36.8 C) (Oral)   Resp 16   Ht 5\' 2"  (1.575 m)   Wt (!) 304 lb 8 oz (138.1 kg)   SpO2 97%   BMI 55.69 kg/m  GEN- NAD, alert and oriented x3 HEENT- PERRL, EOMI, non injected sclera, pink conjunctiva, MMM, oropharynx mild injection, TM clear bilat no effusion,  + maxillary/frontal sinus tenderness, inflammed turbinates,  Nasal drainage  Neck- Supple, no LAD CVS- RRR, no  murmur RESP-CTAB abd-nabs,SOFT,nt,nd EXT- No edema Pulses- Radial 2+         Assessment & Plan:      Problem List Items Addressed This Visit      Unprioritized   Anemia   Diabetes mellitus ([place])    Continue metformin like we discussed fasting labs to be done today.  She is on ACE inhibitor and statin drug      Essential hypertension, benign    Elevated  blood pressure in the setting of using decongestant daily for the past couple weeks.  She is to discontinue this use.  Continue to check blood pressure at home      Hyperlipidemia   Migraine headache    He does have underlying migraines but also with a sinus infection today.  We will treat this and see how her headaches do.  She can use a half a tramadol or acetaminophen as needed her headaches do not improve after treatment for the sinusitis still having behind the temples top of head will need imaging       Other Visit Diagnoses    Acute rhinosinusitis    -  Primary   Start augmentin, add prednisone, D/C mucinex sinus due to decongestant, can use regulary mucinex    Relevant Medications   predniSONE (DELTASONE) 20 MG tablet   amoxicillin-clavulanate (AUGMENTIN) 875-125 MG tablet      Note: This dictation was prepared with Dragon dictation along with smaller  Company secretary. Any transcriptional errors that result from this process are unintentional.

## 2018-10-09 NOTE — Patient Instructions (Addendum)
Stop the mucinex sinus  Take prednisone  Take new antibiotic Okay to use tylenol or 1/2 tramadol  F/U 3 months

## 2018-10-09 NOTE — Assessment & Plan Note (Signed)
Elevated  blood pressure in the setting of using decongestant daily for the past couple weeks.  She is to discontinue this use.  Continue to check blood pressure at home

## 2018-10-09 NOTE — Assessment & Plan Note (Signed)
Continue metformin like we discussed fasting labs to be done today.  She is on ACE inhibitor and statin drug

## 2018-10-10 LAB — CBC WITH DIFFERENTIAL/PLATELET
Absolute Monocytes: 596 cells/uL (ref 200–950)
Basophils Absolute: 30 cells/uL (ref 0–200)
Basophils Relative: 0.3 %
Eosinophils Absolute: 141 cells/uL (ref 15–500)
Eosinophils Relative: 1.4 %
HCT: 40.4 % (ref 35.0–45.0)
Hemoglobin: 11.9 g/dL (ref 11.7–15.5)
Lymphs Abs: 1555 cells/uL (ref 850–3900)
MCH: 21.2 pg — ABNORMAL LOW (ref 27.0–33.0)
MCHC: 29.5 g/dL — ABNORMAL LOW (ref 32.0–36.0)
MCV: 72 fL — ABNORMAL LOW (ref 80.0–100.0)
MPV: 10.3 fL (ref 7.5–12.5)
Monocytes Relative: 5.9 %
Neutro Abs: 7777 cells/uL (ref 1500–7800)
Neutrophils Relative %: 77 %
Platelets: 284 10*3/uL (ref 140–400)
RBC: 5.61 10*6/uL — ABNORMAL HIGH (ref 3.80–5.10)
RDW: 16.6 % — ABNORMAL HIGH (ref 11.0–15.0)
Total Lymphocyte: 15.4 %
WBC: 10.1 10*3/uL (ref 3.8–10.8)

## 2018-10-10 LAB — LIPID PANEL
Cholesterol: 186 mg/dL (ref <200)
HDL: 38 mg/dL — ABNORMAL LOW (ref >=50)
LDL Cholesterol (Calc): 128 mg/dL (calc) — ABNORMAL HIGH
Non-HDL Cholesterol (Calc): 148 mg/dL (calc) — ABNORMAL HIGH (ref <130)
Total CHOL/HDL Ratio: 4.9 (calc) (ref <5.0)
Triglycerides: 95 mg/dL (ref <150)

## 2018-10-10 LAB — HEMOGLOBIN A1C
Hgb A1c MFr Bld: 6.6 % of total Hgb — ABNORMAL HIGH (ref <5.7)
Mean Plasma Glucose: 143 (calc)
eAG (mmol/L): 7.9 (calc)

## 2018-10-10 LAB — IRON,TIBC AND FERRITIN PANEL
%SAT: 13 % (calc) — ABNORMAL LOW (ref 16–45)
Ferritin: 75 ng/mL (ref 16–288)
Iron: 44 ug/dL — ABNORMAL LOW (ref 45–160)
TIBC: 346 mcg/dL (calc) (ref 250–450)

## 2018-10-10 LAB — COMPREHENSIVE METABOLIC PANEL
AG Ratio: 1.3 (calc) (ref 1.0–2.5)
ALT: 6 U/L (ref 6–29)
AST: 10 U/L (ref 10–35)
Albumin: 3.9 g/dL (ref 3.6–5.1)
Alkaline phosphatase (APISO): 79 U/L (ref 37–153)
BUN: 14 mg/dL (ref 7–25)
CO2: 27 mmol/L (ref 20–32)
Calcium: 9.3 mg/dL (ref 8.6–10.4)
Chloride: 104 mmol/L (ref 98–110)
Creat: 0.65 mg/dL (ref 0.50–0.99)
Globulin: 3.1 g/dL (calc) (ref 1.9–3.7)
Glucose, Bld: 110 mg/dL — ABNORMAL HIGH (ref 65–99)
Potassium: 3.9 mmol/L (ref 3.5–5.3)
Sodium: 142 mmol/L (ref 135–146)
Total Bilirubin: 0.3 mg/dL (ref 0.2–1.2)
Total Protein: 7 g/dL (ref 6.1–8.1)

## 2018-10-10 LAB — MICROALBUMIN / CREATININE URINE RATIO
Creatinine, Urine: 220 mg/dL (ref 20–275)
Microalb Creat Ratio: 48 mcg/mg creat — ABNORMAL HIGH (ref <30)
Microalb, Ur: 10.6 mg/dL

## 2018-10-11 ENCOUNTER — Other Ambulatory Visit: Payer: Self-pay | Admitting: *Deleted

## 2018-10-11 MED ORDER — FUSION PLUS PO CAPS: 2.0000 | ORAL_CAPSULE | Freq: Every day | ORAL | 3 refills | Status: DC

## 2018-10-11 MED ORDER — ATORVASTATIN CALCIUM 40 MG PO TABS: 40.0000 mg | ORAL_TABLET | Freq: Every day | ORAL | 3 refills | Status: DC

## 2018-10-12 ENCOUNTER — Other Ambulatory Visit: Payer: Self-pay | Admitting: *Deleted

## 2018-10-12 DIAGNOSIS — I10 Essential (primary) hypertension: Secondary | ICD-10-CM

## 2018-10-12 MED ORDER — LISINOPRIL 20 MG PO TABS: 20.0000 mg | ORAL_TABLET | Freq: Every day | ORAL | 1 refills | Status: DC

## 2018-10-13 ENCOUNTER — Other Ambulatory Visit: Payer: Self-pay

## 2018-10-13 ENCOUNTER — Encounter (HOSPITAL_COMMUNITY): Payer: Self-pay | Admitting: Emergency Medicine

## 2018-10-13 ENCOUNTER — Emergency Department (HOSPITAL_COMMUNITY): Payer: BC Managed Care – PPO

## 2018-10-13 ENCOUNTER — Emergency Department (HOSPITAL_COMMUNITY)
Admission: EM | Admit: 2018-10-13 | Discharge: 2018-10-13 | Disposition: A | Discharge status: Home / Self Care | Payer: BC Managed Care – PPO | Attending: Emergency Medicine | Admitting: Emergency Medicine

## 2018-10-13 DIAGNOSIS — R0981 Nasal congestion: Secondary | ICD-10-CM | POA: Diagnosis not present

## 2018-10-13 DIAGNOSIS — Z87891 Personal history of nicotine dependence: Secondary | ICD-10-CM | POA: Insufficient documentation

## 2018-10-13 DIAGNOSIS — Z79899 Other long term (current) drug therapy: Secondary | ICD-10-CM | POA: Insufficient documentation

## 2018-10-13 DIAGNOSIS — E119 Type 2 diabetes mellitus without complications: Secondary | ICD-10-CM | POA: Insufficient documentation

## 2018-10-13 DIAGNOSIS — I10 Essential (primary) hypertension: Secondary | ICD-10-CM | POA: Diagnosis not present

## 2018-10-13 DIAGNOSIS — Z7984 Long term (current) use of oral hypoglycemic drugs: Secondary | ICD-10-CM | POA: Insufficient documentation

## 2018-10-13 DIAGNOSIS — Z7901 Long term (current) use of anticoagulants: Secondary | ICD-10-CM | POA: Insufficient documentation

## 2018-10-13 DIAGNOSIS — G43809 Other migraine, not intractable, without status migrainosus: Secondary | ICD-10-CM | POA: Diagnosis not present

## 2018-10-13 DIAGNOSIS — R51 Headache: Secondary | ICD-10-CM | POA: Diagnosis not present

## 2018-10-13 LAB — BASIC METABOLIC PANEL
Anion gap: 11 (ref 5–15)
BUN: 12 mg/dL (ref 8–23)
CO2: 27 mmol/L (ref 22–32)
Calcium: 9 mg/dL (ref 8.9–10.3)
Chloride: 102 mmol/L (ref 98–111)
Creatinine, Ser: 0.68 mg/dL (ref 0.44–1.00)
GFR calc Af Amer: >60 mL/min (ref >60)
GFR calc non Af Amer: >60 mL/min (ref >60)
Glucose, Bld: 147 mg/dL — ABNORMAL HIGH (ref 70–99)
Potassium: 3.4 mmol/L — ABNORMAL LOW (ref 3.5–5.1)
Sodium: 140 mmol/L (ref 135–145)

## 2018-10-13 LAB — CBC
HCT: 38.3 % (ref 36.0–46.0)
Hemoglobin: 11.8 g/dL — ABNORMAL LOW (ref 12.0–15.0)
MCH: 21.9 pg — ABNORMAL LOW (ref 26.0–34.0)
MCHC: 30.8 g/dL (ref 30.0–36.0)
MCV: 70.9 fL — ABNORMAL LOW (ref 80.0–100.0)
Platelets: 277 10*3/uL (ref 150–400)
RBC: 5.4 MIL/uL — ABNORMAL HIGH (ref 3.87–5.11)
RDW: 17.2 % — ABNORMAL HIGH (ref 11.5–15.5)
WBC: 14.3 10*3/uL — ABNORMAL HIGH (ref 4.0–10.5)
nRBC: 0 % (ref 0.0–0.2)

## 2018-10-13 MED ORDER — PROCHLORPERAZINE EDISYLATE 10 MG/2ML IJ SOLN
10.0000 mg | Freq: Once | INTRAMUSCULAR | Status: AC
  Administered 2018-10-13: 10 mg via INTRAVENOUS
  Filled 2018-10-13: qty 2

## 2018-10-13 MED ORDER — IOHEXOL 300 MG/ML  SOLN
75.0000 mL | Freq: Once | INTRAMUSCULAR | Status: AC | PRN
  Administered 2018-10-13: 100 mL via INTRAVENOUS

## 2018-10-13 MED ORDER — DIPHENHYDRAMINE HCL 50 MG/ML IJ SOLN
25.0000 mg | Freq: Once | INTRAMUSCULAR | Status: AC
  Administered 2018-10-13: 25 mg via INTRAVENOUS
  Filled 2018-10-13: qty 1

## 2018-10-13 MED ORDER — SODIUM CHLORIDE 0.9 % IV SOLN
INTRAVENOUS | Status: DC
  Administered 2018-10-13: 12:00:00 via INTRAVENOUS

## 2018-10-13 MED ORDER — KETOROLAC TROMETHAMINE 30 MG/ML IJ SOLN
30.0000 mg | Freq: Once | INTRAMUSCULAR | Status: AC
  Administered 2018-10-13: 30 mg via INTRAVENOUS
  Filled 2018-10-13: qty 1

## 2018-10-13 MED ORDER — METOCLOPRAMIDE HCL 10 MG PO TABS: 10.0000 mg | ORAL_TABLET | Freq: Three times a day (TID) | ORAL | 0 refills | Status: AC | PRN

## 2018-10-13 MED ORDER — AMLODIPINE BESYLATE 5 MG PO TABS: 5.0000 mg | ORAL_TABLET | Freq: Every day | ORAL | 0 refills | Status: DC

## 2018-10-13 NOTE — ED Provider Notes (Signed)
[facility] EMERGENCY DEPARTMENT Provider Note   CSN: [id number] Arrival date & time: 10/13/18  1102    History   Chief Complaint Chief Complaint  Patient presents with   Headache    HPI Monique Gomez is a 63 y.o. female.     HPI  The patient is a 63 year old female, she has a history of sinusitis in the past, she has also a type II diabetic, she has history of headaches, hypertension, hyperlipidemia and paroxysmal atrial fibrillation.  She currently takes medications including Augmentin which was started several days ago by her family doctor, a steroid and Eliquis.  She reports that the headache is been going on for the better part of a week or longer.  It is above her eyes around her nose, right side of the face wrapping to the right temporal area and to the top of the head.  It is not associated with fevers but is often associated with dizziness which she describes as a lightheaded feeling.  Despite the use of her medications she seems to be getting worse.  She was seen by her family doctor by telemedicine who started the Augmentin, she also increased her blood pressure medications because of the hypertension she was experiencing.  Of note she has been using some over-the-counter decongestants which seem to be driving up her blood pressure.   Past Medical History:  Diagnosis Date   Allergic rhinitis    Anemia    Essential hypertension, benign    GERD (gastroesophageal reflux disease)    Headache(784.0)    History of cardiac catheterization    No significant CAD May 2015   History of migraine headaches    Hyperlipidemia    Obesity    PAF (paroxysmal atrial fibrillation) ([place]) 08/2013   Type 2 diabetes mellitus [facility])     Patient Active Problem List   Diagnosis Date Noted   Screening for colon cancer 01/07/2015   Hemorrhoids 11/25/2014   Obstructive sleep apnea 10/14/2013   Atrial fibrillation ([place]) 09/06/2013   Lumbar back pain 09/06/2013   NSTEMI  (non-ST elevated myocardial infarction) ([place]) 09/03/2013   MDD (major depressive disorder), single episode 02/04/2013   Anxiety state, unspecified 02/04/2013   Diabetes mellitus ([place]) 06/06/2012   Carpal tunnel syndrome of left wrist 12/27/2011   De Quervain's disease (tenosynovitis) 10/09/2011   Heartburn 02/27/2011   Allergic rhinitis 08/29/2010   Anemia 02/15/2010   Hyperlipidemia 12/23/2009   Morbid obesity ([place]) 12/23/2009   Migraine headache 12/23/2009   Essential hypertension, benign 12/23/2009    Past Surgical History:  Procedure Laterality Date   CATARACT EXTRACTION     COLONOSCOPY N/A 02/02/2015   SLF: 1. one colon polyp removed-no source for anemia identified. 2. moderate diverticulosis noted in the sigmoid colon and descending colon 3. the left colon is redundant 4. Rectal bleeding due ot small internal hemorroids 5. Moderate sized external hemorrhoids.   ESOPHAGOGASTRODUODENOSCOPY N/A 02/02/2015   SLF: 1. 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 large hiatal hernia 3. mild non-erosive gastritis 4. No source for anemia identified.    LEFT HEART CATHETERIZATION WITH CORONARY ANGIOGRAM N/A 09/03/2013   Procedure: LEFT HEART CATHETERIZATION WITH CORONARY ANGIOGRAM;  Surgeon: Jettie Booze, MD;  Location: [facility] CATH LAB;  Service: Cardiovascular;  Laterality: N/A;   Right carpal tunnel release       OB History   No obstetric history on file.      Home Medications    Prior to Admission medications   Medication  Sig Start Date End Date Taking? Authorizing Provider  amoxicillin-clavulanate (AUGMENTIN) 875-125 MG tablet Take 1 tablet by mouth 2 (two) times daily. 10/09/18  Yes [place], Modena Nunnery, MD  atenolol (TENORMIN) 25 MG tablet Take 1 tablet (25 mg total) by mouth daily. 12/27/17  Yes [place], Modena Nunnery, MD  atorvastatin (LIPITOR) 40 MG tablet Take 1 tablet (40 mg total) by mouth daily. 10/11/18  Yes [place], Modena Nunnery, MD  ELIQUIS 5 MG TABS tablet  TAKE 1 TABLET BY MOUTH TWICE DAILY 03/06/18  Yes [place], Modena Nunnery, MD  Iron-FA-B Cmp-C-Biot-Probiotic (FUSION PLUS) CAPS Take 2 tablets by mouth daily. 10/11/18  Yes [place], Modena Nunnery, MD  lisinopril (ZESTRIL) 20 MG tablet Take 1 tablet (20 mg total) by mouth daily. 10/12/18  Yes [place], Modena Nunnery, MD  metFORMIN (GLUCOPHAGE-XR) 500 MG 24 hr tablet TAKE 1 TABLET BY MOUTH ONCE DAILY IN THE EVENING 02/27/18  Yes [place], Modena Nunnery, MD  pantoprazole (PROTONIX) 40 MG tablet TAKE 1 TABLET BY MOUTH TWICE DAILY 02/05/18  Yes [place], Modena Nunnery, MD  predniSONE (DELTASONE) 20 MG tablet Take 1 tablet (20 mg total) by mouth daily with breakfast. 10/09/18  Yes [place], Modena Nunnery, MD  amLODipine (NORVASC) 5 MG tablet Take 1 tablet (5 mg total) by mouth daily. 10/13/18   Noemi Chapel, MD  metoCLOPramide (REGLAN) 10 MG tablet Take 1 tablet (10 mg total) by mouth every 8 (eight) hours as needed for up to 7 days for nausea (Headache). 10/13/18 10/20/18  Noemi Chapel, MD    Family History Family History  Problem Relation Age of Onset   Diabetes Mother    Hypertension Mother    Heart disease Mother    Hypertension Brother    Colon cancer Neg Hx     Social History Social History   Tobacco Use   Smoking status: Former Smoker    Packs/day: 0.30    Types: Cigarettes    Quit date: 04/01/2012    Years since quitting: 6.5   Smokeless tobacco: Never Used  Substance Use Topics   Alcohol use: Yes    Alcohol/week: 0.0 standard drinks    Comment: Wine typically 2-3 glasses every few weekends   Drug use: No     Allergies   Patient has no known allergies.   Review of Systems Review of Systems  All other systems reviewed and are negative.    Physical Exam Updated Vital Signs BP (!) 185/88 (BP Location: Left Arm)    Pulse 64    Temp 98 F (36.7 C) (Oral)    Resp 18    Ht 1.575 m (5\' 2" )    Wt (!) 137.9 kg    SpO2 99%    BMI 55.60 kg/m   Physical Exam Vitals signs and nursing note reviewed.    Constitutional:      General: She is not in acute distress.    Appearance: She is well-developed.  HENT:     Head: Normocephalic and atraumatic.     Comments: There is tenderness to palpation around the right maxillary sinus, there is also tenderness over the bilateral frontal regions, no temporal artery tenderness.    Nose:     Comments: Nasal passages are patent, there is no discharge, there is some turbinate swelling with some erythema on the bilateral turbinates.    Mouth/Throat:     Pharynx: No oropharyngeal exudate.     Comments: Oropharynx is clear and moist, there is no trismus or torticollis Eyes:  General: No scleral icterus.       Right eye: No discharge.        Left eye: No discharge.     Conjunctiva/sclera: Conjunctivae normal.     Pupils: Pupils are equal, round, and reactive to light.  Neck:     Musculoskeletal: Normal range of motion and neck supple.     Thyroid: No thyromegaly.     Vascular: No JVD.  Cardiovascular:     Rate and Rhythm: Normal rate and regular rhythm.     Heart sounds: Normal heart sounds. No murmur. No friction rub. No gallop.   Pulmonary:     Effort: Pulmonary effort is normal. No respiratory distress.     Breath sounds: Normal breath sounds. No wheezing or rales.  Abdominal:     General: Bowel sounds are normal. There is no distension.     Palpations: Abdomen is soft. There is no mass.     Tenderness: There is no abdominal tenderness.  Musculoskeletal: Normal range of motion.        General: No tenderness.  Lymphadenopathy:     Cervical: No cervical adenopathy.  Skin:    General: Skin is warm and dry.     Findings: No erythema or rash.  Neurological:     Mental Status: She is alert.     Coordination: Coordination normal.     Comments: Speech is clear, cranial nerves III through XII are intact, memory is intact, strength is normal in all 4 extremities including grips, sensation is intact to light touch and pinprick in all 4  extremities. Coordination as tested by finger-nose-finger is normal, no limb ataxia. Normal gait, normal reflexes at the patellar tendons bilaterally  Psychiatric:        Behavior: Behavior normal.      ED Treatments / Results  Labs (all labs ordered are listed, but only abnormal results are displayed) Labs Reviewed  CBC - Abnormal; Notable for the following components:      Result Value   WBC 14.3 (*)    RBC 5.40 (*)    Hemoglobin 11.8 (*)    MCV 70.9 (*)    MCH 21.9 (*)    RDW 17.2 (*)    All other components within normal limits  BASIC METABOLIC PANEL - Abnormal; Notable for the following components:   Potassium 3.4 (*)    Glucose, Bld 147 (*)    All other components within normal limits    EKG None  Radiology Ct Head Wo Contrast  Result Date: 10/13/2018 CLINICAL DATA:  Headache for several weeks with sinus congestion. EXAM: CT HEAD WITHOUT CONTRAST TECHNIQUE: Contiguous axial images were obtained from the base of the skull through the vertex without intravenous contrast. COMPARISON:  12/08/2016 FINDINGS: Brain: There is no evidence of acute infarct, intracranial hemorrhage, mass, midline shift, or extra-axial fluid collection. The ventricles and sulci are normal. Patchy cerebral white matter hypodensities are similar to the prior study and nonspecific but compatible with mild chronic small vessel ischemic disease. Vascular: Mild calcified atherosclerosis at the skull base. No hyperdense vessel. Skull: No fracture or focal osseous lesion. Sinuses/Orbits: More fully evaluated on separate maxillofacial CT. Other: None. IMPRESSION: 1. No evidence of acute intracranial abnormality. 2. Mild chronic small vessel ischemic disease. Electronically Signed   By: Logan Bores M.D.   On: 10/13/2018 13:17   Ct Maxillofacial W Contrast  Result Date: 10/13/2018 CLINICAL DATA:  Headache for several weeks with sinus congestion. EXAM: CT MAXILLOFACIAL WITH CONTRAST TECHNIQUE: Multidetector  CT  imaging of the maxillofacial structures was performed with intravenous contrast. Multiplanar CT image reconstructions were also generated. CONTRAST:  136mL OMNIPAQUE IOHEXOL 300 MG/ML  SOLN COMPARISON:  12/08/2016 FINDINGS: Osseous: No fracture or suspicious osseous lesion. Unchanged mild periapical lucency associated with the right mandibular first molar tooth in the setting of previous root canal. Orbits: Mild chronic asymmetric enlargement of the right lacrimal gland, unchanged. No acute orbital inflammatory changes. Interval left cataract extraction. Sinuses: Paranasal sinuses and mastoid air cells are clear. Midline nasal septum. Soft tissues: Mild carotid artery atherosclerosis. Limited intracranial: Unremarkable. IMPRESSION: No evidence of sinusitis or other acute maxillofacial abnormality. Electronically Signed   By: Logan Bores M.D.   On: 10/13/2018 13:15    Procedures Procedures (including critical care time)  Medications Ordered in ED Medications  0.9 %  sodium chloride infusion ( Intravenous New Bag/Given 10/13/18 1228)  prochlorperazine (COMPAZINE) injection 10 mg (10 mg Intravenous Given 10/13/18 1229)  diphenhydrAMINE (BENADRYL) injection 25 mg (25 mg Intravenous Given 10/13/18 1229)  iohexol (OMNIPAQUE) 300 MG/ML solution 75 mL (100 mLs Intravenous Contrast Given 10/13/18 1257)  ketorolac (TORADOL) 30 MG/ML injection 30 mg (30 mg Intravenous Given 10/13/18 1340)     Initial Impression / Assessment and Plan / ED Course  I have reviewed the triage vital signs and the nursing notes.  Pertinent labs & imaging results that were available during my care of the patient were reviewed by me and considered in my medical decision making (see chart for details).       The patient does not appear to be in distress, she does appear to have some sinusitis although without any drainage or discharge or significant nasal congestion I question the diagnosis.  I am concerned about her progressive  lightheadedness and dizziness with her worsening headache.  We will start with a CT scan to rule out invasive disease, will also give medications for possible migraine and then recheck blood pressure once pain is controlled.   The pain is much better, she is now stating that her headache is only 3 out of 10, she is ambulated back and forth to the bathroom and feels much better.  The CT scan showed no signs of sinusitis or any other intracranial abnormalities.  Her lab work has a nonspecific leukocytosis of 14,300 but no anemia no electrolyte abnormalities and vital signs which only reflect some hypertension which she states is better than it was the other day.  She has been increased on her lisinopril from 10 to 20 mg.  I will add 5 mg of amlodipine for persistent elevated hypertension.  She expressed her understanding to the need for follow-up in the outpatient setting.  I do not see any signs of stroke or other serious abnormalities that would require admission or further evaluation in the emergency department.   Final Clinical Impressions(s) / ED Diagnoses   Final diagnoses:  Other migraine without status migrainosus, not intractable  Essential hypertension    ED Discharge Orders         Ordered    amLODipine (NORVASC) 5 MG tablet  Daily     10/13/18 1503    metoCLOPramide (REGLAN) 10 MG tablet  Every 8 hours PRN     10/13/18 1504           Noemi Chapel, MD 10/13/18 1506

## 2018-10-13 NOTE — ED Triage Notes (Signed)
Pt reports headache ongoing for several weeks with sinus congestion. Has seen pcp for this and is on abx and prednisone. Is also concerned bp may be high.

## 2018-10-13 NOTE — Discharge Instructions (Signed)
Please take ibuprofen 3 times a day up to 400 mg per dose, if this does not control your headache you may add Reglan every 8 hours as needed.  If this medication makes you feel stiff or agitated or anxious take 25 mg of Benadryl and call your doctor.  I would also encourage you to start taking amlodipine, 1 tablet a day if your blood pressure is consistently over 180 on the top number.  Have your family doctor recheck your blood pressure within 2 weeks.  Seek medical exam for severe or worsening symptoms

## 2018-10-17 ENCOUNTER — Other Ambulatory Visit: Payer: Self-pay

## 2018-10-17 ENCOUNTER — Ambulatory Visit (INDEPENDENT_AMBULATORY_CARE_PROVIDER_SITE_OTHER): Payer: BC Managed Care – PPO | Admitting: Family Medicine

## 2018-10-17 ENCOUNTER — Encounter: Payer: Self-pay | Admitting: Family Medicine

## 2018-10-17 VITALS — BP 138/72 | HR 68 | Temp 98.4°F | Resp 14 | Ht 62.0 in | Wt 301.0 lb

## 2018-10-17 DIAGNOSIS — I10 Essential (primary) hypertension: Secondary | ICD-10-CM | POA: Diagnosis not present

## 2018-10-17 DIAGNOSIS — G43709 Chronic migraine without aura, not intractable, without status migrainosus: Secondary | ICD-10-CM

## 2018-10-17 MED ORDER — BUTALBITAL-APAP-CAFFEINE 50-325-40 MG PO TABS: 1.0000 | ORAL_TABLET | Freq: Four times a day (QID) | ORAL | 0 refills | Status: DC | PRN

## 2018-10-17 MED ORDER — TOPIRAMATE 25 MG PO TABS: ORAL_TABLET | ORAL | 2 refills | Status: DC

## 2018-10-17 MED ORDER — KETOROLAC TROMETHAMINE 60 MG/2ML IM SOLN
60.0000 mg | Freq: Once | INTRAMUSCULAR | Status: AC
  Administered 2018-10-17: 60 mg via INTRAMUSCULAR

## 2018-10-17 NOTE — Assessment & Plan Note (Signed)
Ongoing HA with elevated BP Given toradol Start topamax, will also help with weight  fioricet for breakthrough pain No decongestants

## 2018-10-17 NOTE — Patient Instructions (Addendum)
Return to work on Monday  Start topamax  Use Fioricet for breakthrough pain  Keep lisinopril at 20mg  once a day  Shot of toradol given  F/U 4 weeks  Send me blood pressure readings on Monday

## 2018-10-17 NOTE — Progress Notes (Signed)
   Subjective:    Patient ID: Monique Gomez, female    DOB: 07/29/1955, 63 y.o.   MRN: [id number]  Patient presents for ER F/u (HA- has been out of work since Friday D/T HA)  Pt here for ER follow up, had severe headache on 6/13, was taking decongestant.  Given migraine cocktail at ER, headache has improved CT scan  Given norvasc 5mg  but did not take , she is taking the lisinopril 20mg    Has not been to work since Last Friday , had dizziness while driving and the ongoing headache per above  She is sensitive to light and sound  Headache has been constant Ultram didn't help, tylenol, ibuprofen , toradol in ER helped a little    Stopped  the antibiotic and prednisone as no sinuitis on scan      Review Of Systems:  GEN- denies fatigue, fever, weight loss,weakness, recent illness HEENT- denies eye drainage, change in vision, nasal discharge, CVS- denies chest pain, palpitations RESP- denies SOB, cough, wheeze ABD- denies N/V, change in stools, abd pain GU- denies dysuria, hematuria, dribbling, incontinence MSK- denies joint pain, muscle aches, injury Neuro-+ headache, dizziness, syncope, seizure activity       Objective:    BP 138/72   Pulse 68   Temp 98.4 F (36.9 C) (Oral)   Resp 14   Ht 5\' 2"  (1.575 m)   Wt (!) 301 lb (136.5 kg)   SpO2 97%   BMI 55.05 kg/m  GEN- NAD, alert and oriented x3 , repeat BP 150/84 HEENT- PERRL, EOMI, non injected sclera, pink conjunctiva, MMM, oropharynx clear Neck- Supple, CVS- RRR, no murmur RESP-CTAB NEURO-CNII-XII in tact no deficits  EXT- No edema Pulses- Radial 2+        Assessment & Plan:     Note for work given Problem List Items Addressed This Visit      Unprioritized   Essential hypertension, benign - Primary    Elevated BP MTF,  Repeat BP went up, was complaining of a lot of HA pain Have her trend at home, see if getting headache under control helps keep BP down Will continue to adjust ACEI      Migraine  headache    Ongoing HA with elevated BP Given toradol Start topamax, will also help with weight  fioricet for breakthrough pain No decongestants      Relevant Medications   topiramate (TOPAMAX) 25 MG tablet   butalbital-acetaminophen-caffeine (FIORICET) 50-325-40 MG tablet      Note: This dictation was prepared with Dragon dictation along with smaller phrase technology. Any transcriptional errors that result from this process are unintentional.

## 2018-10-17 NOTE — Assessment & Plan Note (Signed)
Elevated BP MTF,  Repeat BP went up, was complaining of a lot of HA pain Have her trend at home, see if getting headache under control helps keep BP down Will continue to adjust ACEI

## 2018-10-19 ENCOUNTER — Telehealth: Payer: Self-pay | Admitting: *Deleted

## 2018-10-19 MED ORDER — METFORMIN HCL 500 MG PO TABS: 500.0000 mg | ORAL_TABLET | Freq: Two times a day (BID) | ORAL | 3 refills | Status: DC

## 2018-10-19 NOTE — Telephone Encounter (Signed)
Prescription sent to pharmacy.   Call placed to patient and patient made aware per VM. 

## 2018-10-19 NOTE — Telephone Encounter (Signed)
Change to Metformin 500mg  BID

## 2018-10-19 NOTE — Telephone Encounter (Signed)
Received call from patient [phone]~ telephone.   Reports that she was contacted by pharmacy about her current dose of MTF. States that MTF ER has been recalled.   MD please advise.

## 2018-10-22 ENCOUNTER — Other Ambulatory Visit: Payer: Self-pay

## 2018-10-22 ENCOUNTER — Ambulatory Visit (INDEPENDENT_AMBULATORY_CARE_PROVIDER_SITE_OTHER): Payer: BC Managed Care – PPO | Admitting: Family Medicine

## 2018-10-22 ENCOUNTER — Encounter: Payer: Self-pay | Admitting: Family Medicine

## 2018-10-22 ENCOUNTER — Telehealth: Payer: Self-pay | Admitting: Family Medicine

## 2018-10-22 VITALS — BP 148/86 | HR 74 | Temp 98.1°F | Resp 14 | Ht 62.0 in | Wt 301.0 lb

## 2018-10-22 DIAGNOSIS — I10 Essential (primary) hypertension: Secondary | ICD-10-CM

## 2018-10-22 DIAGNOSIS — G43709 Chronic migraine without aura, not intractable, without status migrainosus: Secondary | ICD-10-CM

## 2018-10-22 MED ORDER — KETOROLAC TROMETHAMINE 60 MG/2ML IM SOLN
60.0000 mg | Freq: Once | INTRAMUSCULAR | Status: AC
  Administered 2018-10-22: 60 mg via INTRAMUSCULAR

## 2018-10-22 MED ORDER — PROMETHAZINE HCL 25 MG/ML IJ SOLN
25.0000 mg | Freq: Once | INTRAMUSCULAR | Status: AC
  Administered 2018-10-22: 25 mg via INTRAMUSCULAR

## 2018-10-22 NOTE — Progress Notes (Signed)
Subjective:    Patient ID: Monique Gomez, female    DOB: 1956-01-27, 63 y.o.   MRN: [id number]  Patient presents for HTN and Headache   Pt here for hypertension and ongoing headache. Seen last Wed, given toradol, lisinopril had recently been increased to 20mg   she was also started on topamax at bedtime and given fioricet for break-through pain.   This morning she called and see telephone note.  Her blood pressure been elevated throughout the weekend along with severe headache.  She was unable to go to work.  Her blood pressure this morning was 150/1 30s heart rate in the 70s.  I recommended that she take another atenolol before office visit which she did around noon today.  States that she also drinks 40 ounces of water.  Her headache is very severe.  She has not had any fever congestion cough shortness of breath chest pain no dizziness just a severe headache.  Note she did have a CT scan done on the 13th when her headache started which was unremarkable.  He does state that she was given Toradol on Wednesday when I last saw her her headache resolved and she was fine until Thursday morning when it returned   Review Of Systems:  GEN- denies fatigue, fever, weight loss,weakness, recent illness HEENT- denies eye drainage, change in vision, nasal discharge, CVS- denies chest pain, palpitations RESP- denies SOB, cough, wheeze ABD- denies N/V, change in stools, abd pain GU- denies dysuria, hematuria, dribbling, incontinence MSK- denies joint pain, muscle aches, injury Neuro- +headache, denies dizziness, syncope, seizure activity       Objective:    BP (!) 148/86   Pulse 74   Temp 98.1 F (36.7 C) (Oral)   Resp 14   Ht 5\' 2"  (1.575 m)   Wt (!) 301 lb (136.5 kg)   SpO2 96%   BMI 55.05 kg/m  GEN- NAD, alert and oriented x3  148/96 Left arm  HEENT- PERRL, EOMI, non injected sclera, pink conjunctiva, MMM, oropharynx clear Neck- Supple, CVS- RRR, no murmur RESP-CTAB NEURO-  CNII-XII grossly in tact, gait normal, no nystagmus EXT- No edema Pulses- Radial  2+        Assessment & Plan:      Problem List Items Addressed This Visit      Unprioritized   Essential hypertension, benign    SHe continues with severe headache along with elevated blood pressure.  Difficult tell the blood pressure is causing a headache but the headache is causing the blood pressure. Tried to give her a bolus of fluid here in the office but unable to get IV.  I did  give her Toradol 60 mg along with Phenergan 25 mg.  I did offer going back to the emergency room for migraine headache control but she declines.  She does not have any new neurological deficits despite having a headache for the past couple weeks.  Her CT scan was negative for any acute abnormality but if we cannot get her headache under better control and the severity continues to increase that think she will need an MRI to look for aneurysm.  Did call the pharmacy and verify the doses of her medications.  We will have her continue atenolol at 50 mg once a day see if the beta-blocker also helps with the headache.  We will also continue lisinopril 20 mg daily. Topamax will be increased to 50 mg at bedtime I do not want to send her with oral NSAID  due to her Eliquis  Due to BP AND Headache, will remain out of work until our recheck on Wed  At end of visit, states headed was starting to improve      Migraine headache - Primary      Note: This dictation was prepared with Diplomatic Services operational officer dictation along with smaller Company secretary. Any transcriptional errors that result from this process are unintentional.

## 2018-10-22 NOTE — Telephone Encounter (Signed)
Sat 171/131  She has been taking atenolol 25mg  in the morning Lisinopil 20mg  ( BP  150/130  HR 70's) this morning    she took both meds at  6:30am  She took topamax 25mg  last night  fioricet is not helping , she took 1 recently  Spoke with pt, advised to go ahead and take another 25mg  of atenolol for her headache and BP  Drink plenty of water, sit with legs elevated Daughter is brining her in to the appointment this afternoon

## 2018-10-22 NOTE — Patient Instructions (Addendum)
F/U Wed for recheck  Take Atenolol 50mg  once a day  Increase topamax top 50mg  at bedtime

## 2018-10-22 NOTE — Assessment & Plan Note (Addendum)
SHe continues with severe headache along with elevated blood pressure.  Difficult tell the blood pressure is causing a headache but the headache is causing the blood pressure. Tried to give her a bolus of fluid here in the office but unable to get IV.  I did  give her Toradol 60 mg along with Phenergan 25 mg.  I did offer going back to the emergency room for migraine headache control but she declines.  She does not have any new neurological deficits despite having a headache for the past couple weeks.  Her CT scan was negative for any acute abnormality but if we cannot get her headache under better control and the severity continues to increase that think she will need an MRI to look for aneurysm.  Did call the pharmacy and verify the doses of her medications.  We will have her continue atenolol at 50 mg once a day see if the beta-blocker also helps with the headache.  We will also continue lisinopril 20 mg daily. Topamax will be increased to 50 mg at bedtime I do not want to send her with oral NSAID due to her Eliquis  Due to BP AND Headache, will remain out of work until our recheck on Wed  At end of visit, states headed was starting to improve

## 2018-10-23 ENCOUNTER — Telehealth: Payer: Self-pay | Admitting: *Deleted

## 2018-10-23 ENCOUNTER — Ambulatory Visit (HOSPITAL_COMMUNITY)
Admission: RE | Admit: 2018-10-23 | Discharge: 2018-10-23 | Disposition: A | Discharge status: Home / Self Care | Payer: BC Managed Care – PPO | Source: Ambulatory Visit | Attending: Family Medicine | Admitting: Family Medicine

## 2018-10-23 DIAGNOSIS — R51 Headache: Secondary | ICD-10-CM | POA: Insufficient documentation

## 2018-10-23 DIAGNOSIS — I10 Essential (primary) hypertension: Secondary | ICD-10-CM

## 2018-10-23 DIAGNOSIS — R519 Headache, unspecified: Secondary | ICD-10-CM

## 2018-10-23 NOTE — Telephone Encounter (Signed)
Received call from patient. [phone]~ telephone.   Reports that Toradol did help her headache yesterday. However, states that HA did return today.   Requesting MRI of brain.   MD please advise.

## 2018-10-23 NOTE — Telephone Encounter (Signed)
I see the order in but it is not STAT. Is it ok to do this routine?

## 2018-10-23 NOTE — Telephone Encounter (Signed)
Patient sent to [facility] for MRI STAT

## 2018-10-23 NOTE — Telephone Encounter (Signed)
MRi to be ordered severe HA R/O Anuerysm in setting of HTN

## 2018-10-23 NOTE — Addendum Note (Signed)
Addended by: Vic Blackbird F on: 10/23/2018 02:09 PM   Modules accepted: Orders

## 2018-10-23 NOTE — Telephone Encounter (Signed)
STAT MRI placed, other one cancelled

## 2018-10-24 ENCOUNTER — Other Ambulatory Visit: Payer: Self-pay

## 2018-10-24 ENCOUNTER — Ambulatory Visit (INDEPENDENT_AMBULATORY_CARE_PROVIDER_SITE_OTHER): Payer: BC Managed Care – PPO | Admitting: Family Medicine

## 2018-10-24 ENCOUNTER — Encounter: Payer: Self-pay | Admitting: Family Medicine

## 2018-10-24 VITALS — BP 116/78 | HR 90 | Temp 99.0°F | Resp 14 | Ht 62.0 in | Wt 294.0 lb

## 2018-10-24 DIAGNOSIS — E236 Other disorders of pituitary gland: Secondary | ICD-10-CM | POA: Diagnosis not present

## 2018-10-24 DIAGNOSIS — G43709 Chronic migraine without aura, not intractable, without status migrainosus: Secondary | ICD-10-CM

## 2018-10-24 DIAGNOSIS — I10 Essential (primary) hypertension: Secondary | ICD-10-CM | POA: Diagnosis not present

## 2018-10-24 MED ORDER — SUMATRIPTAN SUCCINATE 100 MG PO TABS: 100.0000 mg | ORAL_TABLET | ORAL | 0 refills | Status: DC | PRN

## 2018-10-24 MED ORDER — ATENOLOL 50 MG PO TABS: 50.0000 mg | ORAL_TABLET | Freq: Every day | ORAL | 1 refills | Status: DC

## 2018-10-24 NOTE — Assessment & Plan Note (Signed)
Blood pressures improved today we actually took this quite a few times of the has been significantly elevated.  I have given her prescription for a new machine at home.  I Minna continue the atenolol at 50 mg and lisinopril at 20 mg.  I am checking her renal function again today.  We will also check a random cortisol level the setting of this partial empty sella syndrome as it can be associated with hypertension.  We will also get her an urgent appointment with neurology for the ongoing headache.  I do not want to send her home on any type of NSAID in the setting of her Eliquis putting the risk of bleeding at a higher rate.  We will try her on Imitrex and D/C the Fioricet she will continue the Topamax.  Out of work rest of week, due to ongoing BP and HA issues, not safe for her to drive/difficulty concentrating

## 2018-10-24 NOTE — Patient Instructions (Addendum)
Call for telehealth visit with cardiology Continue atenolol at  50mg  once a day Lisinopril 20mg   Continue topamax 50mg   Imitrex sent to pharmacy  We will call with lab results Neurology referral  Prescription for meter  F/U as previous

## 2018-10-24 NOTE — Progress Notes (Signed)
Subjective:    Patient ID: Monique Gomez, female    DOB: 12/03/1955, 63 y.o.   MRN: [id number]  Patient presents for Follow-up (HA some improved but not gone)  Here to follow-up hypertension and headache.  She has been unable to check her blood pressure as her machine does not work.  She does need a prescription for a new blood pressure machine.  She continues to have headache but it has improved.  She had a stat MRI done yesterday there was no sign of aneurysm stroke or bleed she did have incidental finding of partial empty sella syndrome as well is very mild microvascular changes.  I reviewed this in detail with her at the bedside.  She states that the Fioricet does nothing to help her headache.  She is taking the Topamax 50 mg at bedtime.  She is taking atenolol 25 mg twice a day she is also taking her lisinopril 20 mg once a day.  No N/V, no change in vision, no CP  Review Of Systems:  GEN- denies fatigue, fever, weight loss,weakness, recent illness HEENT- denies eye drainage, change in vision, nasal discharge, CVS- denies chest pain, palpitations RESP- denies SOB, cough, wheeze ABD- denies N/V, change in stools, abd pain GU- denies dysuria, hematuria, dribbling, incontinence MSK- denies joint pain, muscle aches, injury Neuro- + headache, denies dizziness, syncope, seizure activity       Objective:    BP 116/78 (BP Location: Left Arm, Patient Position: Sitting)   Pulse 90   Temp 99 F (37.2 C) (Oral)   Resp 14   Ht 5\' 2"  (1.575 m)   Wt 294 lb (133.4 kg)   SpO2 99%   BMI 53.77 kg/m  GEN- NAD, alert and oriented x3  148/96 Left arm  HEENT- PERRL, EOMI, non injected sclera, pink conjunctiva, MMM, oropharynx clear Neck- Supple, CVS- RRR, no murmur RESP-CTAB NEURO- CNII-XII grossly in tact, gait normal, no nystagmus EXT- No edema Pulses- Radial  2+      Assessment & Plan:      Problem List Items Addressed This Visit      Unprioritized   Empty sella syndrome  ([place])   Relevant Orders   Cortisol   Ambulatory referral to Neurology   Essential hypertension, benign   Relevant Medications   atenolol (TENORMIN) 50 MG tablet   Other Relevant Orders   TSH   CBC with Differential/Platelet   Basic metabolic panel   Cortisol   Migraine headache - Primary    Blood pressures improved today we actually took this quite a few times of the has been significantly elevated.  I have given her prescription for a new machine at home.  I Minna continue the atenolol at 50 mg and lisinopril at 20 mg.  I am checking her renal function again today.  We will also check a random cortisol level the setting of this partial empty sella syndrome as it can be associated with hypertension.  We will also get her an urgent appointment with neurology for the ongoing headache.  I do not want to send her home on any type of NSAID in the setting of her Eliquis putting the risk of bleeding at a higher rate.  We will try her on Imitrex and D/C the Fioricet she will continue the Topamax.  Out of work rest of week, due to ongoing BP and HA issues, not safe for her to drive/difficulty concentrating      Relevant Medications   SUMAtriptan (IMITREX)  100 MG tablet   atenolol (TENORMIN) 50 MG tablet   Other Relevant Orders   Ambulatory referral to Neurology      Note: This dictation was prepared with Dragon dictation along with smaller phrase technology. Any transcriptional errors that result from this process are unintentional.

## 2018-10-25 ENCOUNTER — Telehealth: Payer: Self-pay | Admitting: Neurology

## 2018-10-25 NOTE — Telephone Encounter (Signed)
Pt gave consent for video visit.  Pt understands that although there may be some limitations with this type of visit, we will take all precautions to reduce any security or privacy concerns.  Pt understands that this will be treated like an in office visit and we will file with pt's insurance, and there may be a patient responsible charge related to this service. °

## 2018-10-26 ENCOUNTER — Telehealth: Payer: Self-pay | Admitting: Cardiology

## 2018-10-26 LAB — CORTISOL: Cortisol, Plasma: 22.3 ug/dL — ABNORMAL HIGH

## 2018-10-26 LAB — CBC WITH DIFFERENTIAL/PLATELET
Absolute Monocytes: 878 cells/uL (ref 200–950)
Basophils Absolute: 46 cells/uL (ref 0–200)
Basophils Relative: 0.4 %
Eosinophils Absolute: 137 cells/uL (ref 15–500)
Eosinophils Relative: 1.2 %
HCT: 42.8 % (ref 35.0–45.0)
Hemoglobin: 12.7 g/dL (ref 11.7–15.5)
Lymphs Abs: 2462 cells/uL (ref 850–3900)
MCH: 21.3 pg — ABNORMAL LOW (ref 27.0–33.0)
MCHC: 29.7 g/dL — ABNORMAL LOW (ref 32.0–36.0)
MCV: 71.7 fL — ABNORMAL LOW (ref 80.0–100.0)
MPV: 11 fL (ref 7.5–12.5)
Monocytes Relative: 7.7 %
Neutro Abs: 7877 cells/uL — ABNORMAL HIGH (ref 1500–7800)
Neutrophils Relative %: 69.1 %
Platelets: 381 10*3/uL (ref 140–400)
RBC: 5.97 10*6/uL — ABNORMAL HIGH (ref 3.80–5.10)
RDW: 15.8 % — ABNORMAL HIGH (ref 11.0–15.0)
Total Lymphocyte: 21.6 %
WBC: 11.4 10*3/uL — ABNORMAL HIGH (ref 3.8–10.8)

## 2018-10-26 LAB — BASIC METABOLIC PANEL
BUN: 13 mg/dL (ref 7–25)
CO2: 22 mmol/L (ref 20–32)
Calcium: 9.9 mg/dL (ref 8.6–10.4)
Chloride: 106 mmol/L (ref 98–110)
Creat: 0.97 mg/dL (ref 0.50–0.99)
Glucose, Bld: 121 mg/dL — ABNORMAL HIGH (ref 65–99)
Potassium: 4.7 mmol/L (ref 3.5–5.3)
Sodium: 142 mmol/L (ref 135–146)

## 2018-10-26 LAB — TSH: TSH: 2.46 mIU/L (ref 0.40–4.50)

## 2018-10-26 NOTE — Telephone Encounter (Signed)
Patient would like to speak with nurse regarding BP issues / tg

## 2018-10-26 NOTE — Telephone Encounter (Signed)
Requests apt for HTN, has had Ct and MRI recently. Apt made for 11/14/18 with APP

## 2018-10-29 ENCOUNTER — Telehealth: Payer: Self-pay | Admitting: *Deleted

## 2018-10-29 NOTE — Telephone Encounter (Signed)
Work note extended.   Call placed to patient and patient made aware.

## 2018-10-29 NOTE — Telephone Encounter (Signed)
Received call from patient.  [phone]~ telephone.   Reports that she continues to have HA over the weekend that will not resolve. Reports that she also had severe nausea and dizziness while taking Imitrex. Reports that she stopped Imitrex and is feeling some improvement.   Inquired as to if she could come to office to get Toradol injection. MD advised that due to Eliquis use, she can't continue Toradol as it can cause increased bleeding risk. Advised that if Sx are severe, she can go to ER for migraine cocktail. Also advised that she can take Fioricet as needed.   Call placed to patient and patient made aware. States that since she stopped taking Imitrex, the HA is not quite as severe. States that her BP is also doing well (6/28- 127/91, 6/29- 136/84). Reports that she will continue to monitor her BP.   Also states that she remained resting in cool quiet room today. Reports that she did not return to work. Requested to extend work note. MD please advise.

## 2018-10-29 NOTE — Telephone Encounter (Signed)
Okay to extend note to Wed the 1st- after she sees neurologist

## 2018-10-30 ENCOUNTER — Encounter: Payer: Self-pay | Admitting: Neurology

## 2018-10-30 ENCOUNTER — Telehealth (INDEPENDENT_AMBULATORY_CARE_PROVIDER_SITE_OTHER): Payer: BC Managed Care – PPO | Admitting: Neurology

## 2018-10-30 DIAGNOSIS — R51 Headache: Secondary | ICD-10-CM | POA: Diagnosis not present

## 2018-10-30 DIAGNOSIS — G43709 Chronic migraine without aura, not intractable, without status migrainosus: Secondary | ICD-10-CM | POA: Diagnosis not present

## 2018-10-30 DIAGNOSIS — IMO0002 Reserved for concepts with insufficient information to code with codable children: Secondary | ICD-10-CM

## 2018-10-30 DIAGNOSIS — R519 Headache, unspecified: Secondary | ICD-10-CM

## 2018-10-30 MED ORDER — ONDANSETRON 4 MG PO TBDP: 4.0000 mg | ORAL_TABLET | Freq: Three times a day (TID) | ORAL | 6 refills | Status: DC | PRN

## 2018-10-30 MED ORDER — VENLAFAXINE HCL ER 75 MG PO CP24: 75.0000 mg | ORAL_CAPSULE | Freq: Every day | ORAL | 11 refills | Status: DC

## 2018-10-30 MED ORDER — RIZATRIPTAN BENZOATE 10 MG PO TBDP: 10.0000 mg | ORAL_TABLET | ORAL | 6 refills | Status: DC | PRN

## 2018-10-30 MED ORDER — TIZANIDINE HCL 4 MG PO TABS: 4.0000 mg | ORAL_TABLET | Freq: Four times a day (QID) | ORAL | 4 refills | Status: DC | PRN

## 2018-10-30 NOTE — Progress Notes (Signed)
PATIENT: Monique Gomez DOB: 25-Nov-1955  Virtual Visit via video  I connected with Monique Gomez on 10/30/18 at  by video and verified that I am speaking with the correct person using two identifiers.   I discussed the limitations, risks, security and privacy concerns of performing an evaluation and management service by video and the availability of in person appointments. I also discussed with the patient that there may be a patient responsible charge related to this service. The patient expressed understanding and agreed to proceed.  HISTORICAL  Monique Gomez is a 63 year old female, seen in request by her primary care physician Dr. Buelah Manis, Lonell Grandchild F for evaluation of frequent headaches, initial evaluation through virtual visit on October 30, 2018.  I have reviewed and summarized the referring note from the referring physician.  She had past medical history of hypertension, hyperlipidemia, diabetes since 2018, atrial fibrillation, taking Eliquis, denies history of stroke or coronary artery disease  She reported history of migraine headache when she was 20s and 30s, she had severe frequent headaches at that time, but much improved, over the past couple months, she complains of gradual worsening headaches, especially since June 2020, she had a persistent headache for 3 weeks, she complains of daily retro-orbital area, occipital area pressure headaches, movement made it worse, with associated light noise sound sensitivity,  She does complains of anxiety especially with COVID 19, she works as a Education officer, [employer]  She was started on Topamax 25 mg 2 tablets every night as preventive medication, which helps her, was given prescription of Imitrex 100 mg as needed, she tried few times, did not help her headache much, complains of worsening nausea  Previously tried Fioricet with limited help  She was seen by her primary care physician, had MRI and CT head, personally reviewed MRI of the brain  on October 23, 2018, no acute abnormality, mild small vessel disease, partially empty sella CT of maxillary sinus, there was no evidence of acute abnormality  Laboratory evaluations in June 2020: BMP showed mild elevated glucose 121, CBC showed hemoglobin of 12.7, WBC of 11.4, normal TSH, A1c of 6.6,  Observations/Objective: I have reviewed problem lists, medications, allergies.  Awake, alert, oriented to history taking, and casual conversation, moving 4 extremities without difficulties History of Assessment and Plan: Chronic migraine headaches New onset daily persistent headache in the setting of anxiety  Add on Effexor ER 75 mg as a preventive medications,  ESR C-reactive protein to rule out temporal arteritis  Maxalt, plus Aleve, Zofran, tizanidine as needed for severe prolonged headaches    Follow Up Instructions:  In office in 3 to 4 weeks    I discussed the assessment and treatment plan with the patient. The patient was provided an opportunity to ask questions and all were answered. The patient agreed with the plan and demonstrated an understanding of the instructions.   The patient was advised to call back or seek an in-person evaluation if the symptoms worsen or if the condition fails to improve as anticipated.  I provided 30 minutes of non-face-to-face time during this encounter.  REVIEW OF SYSTEMS: Full 14 system review of systems performed and notable only for as above All other review of systems were negative.  ALLERGIES: No Known Allergies  HOME MEDICATIONS: Current Outpatient Medications  Medication Sig Dispense Refill  . atenolol (TENORMIN) 50 MG tablet Take 1 tablet (50 mg total) by mouth daily. 90 tablet 1  . atorvastatin (LIPITOR) 40 MG tablet Take 1 tablet (  40 mg total) by mouth daily. 90 tablet 3  . ELIQUIS 5 MG TABS tablet TAKE 1 TABLET BY MOUTH TWICE DAILY 60 tablet 11  . Iron-FA-B Cmp-C-Biot-Probiotic (FUSION PLUS) CAPS Take 2 tablets by mouth daily. 60  capsule 3  . lisinopril (ZESTRIL) 20 MG tablet Take 1 tablet (20 mg total) by mouth daily. 90 tablet 1  . metFORMIN (GLUCOPHAGE) 500 MG tablet Take 1 tablet (500 mg total) by mouth 2 (two) times daily with a meal. 180 tablet 3  . metoCLOPramide (REGLAN) 10 MG tablet Take 1 tablet (10 mg total) by mouth every 8 (eight) hours as needed for up to 7 days for nausea (Headache). 21 tablet 0  . pantoprazole (PROTONIX) 40 MG tablet TAKE 1 TABLET BY MOUTH TWICE DAILY 180 tablet 3  . SUMAtriptan (IMITREX) 100 MG tablet Take 1 tablet (100 mg total) by mouth every 2 (two) hours as needed for migraine. May repeat in 2 hours if headache persists or recurs. 10 tablet 0  . topiramate (TOPAMAX) 25 MG tablet Take 1-2 tablets at bedtime for migraines 60 tablet 2   No current facility-administered medications for this visit.     PAST MEDICAL HISTORY: Past Medical History:  Diagnosis Date  . Allergic rhinitis   . Anemia   . Essential hypertension, benign   . GERD (gastroesophageal reflux disease)   . Headache(784.0)   . History of cardiac catheterization    No significant CAD May 2015  . History of migraine headaches   . Hyperlipidemia   . Obesity   . PAF (paroxysmal atrial fibrillation) ([place]) 08/2013  . Type 2 diabetes mellitus ([place])     PAST SURGICAL HISTORY: Past Surgical History:  Procedure Laterality Date  . CATARACT EXTRACTION    . COLONOSCOPY N/A 02/02/2015   SLF: 1. one colon polyp removed-no source for anemia identified. 2. moderate diverticulosis noted in the sigmoid colon and descending colon 3. the left colon is redundant 4. Rectal bleeding due ot small internal hemorroids 5. Moderate sized external hemorrhoids.  . ESOPHAGOGASTRODUODENOSCOPY N/A 02/02/2015   SLF: 1. 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 large hiatal hernia 3. mild non-erosive gastritis 4. No source for anemia identified.   [facility] LEFT HEART CATHETERIZATION WITH CORONARY ANGIOGRAM N/A 09/03/2013   Procedure: LEFT  HEART CATHETERIZATION WITH CORONARY ANGIOGRAM;  Surgeon: Jettie Booze, MD;  Location: [facility] CATH LAB;  Service: Cardiovascular;  Laterality: N/A;  . Right carpal tunnel release      FAMILY HISTORY: Family History  Problem Relation Age of Onset  . Diabetes Mother   . Hypertension Mother   . Heart disease Mother   . Hypertension Brother   . Colon cancer Neg Hx     SOCIAL HISTORY:   Social History   Socioeconomic History  . Marital status: Single    Spouse name: Not on file  . Number of children: Not on file  . Years of education: Not on file  . Highest education level: Not on file  Occupational History  . Occupation: Training and development officer: [employer]  . Financial resource strain: Not on file  . Food insecurity    Worry: Not on file    Inability: Not on file  . Transportation needs    Medical: Not on file    Non-medical: Not on file  Tobacco Use  . Smoking status: Former Smoker    Packs/day: 0.30    Types: Cigarettes    Quit date:  04/01/2012    Years since quitting: 6.5  . Smokeless tobacco: Never Used  Substance and Sexual Activity  . Alcohol use: Yes    Alcohol/week: 0.0 standard drinks    Comment: Wine typically 2-3 glasses every few weekends  . Drug use: No  . Sexual activity: Yes  Lifestyle  . Physical activity    Days per week: Not on file    Minutes per session: Not on file  . Stress: Not on file  Relationships  . Social Herbalist on phone: Not on file    Gets together: Not on file    Attends religious service: Not on file    Active member of club or organization: Not on file    Attends meetings of clubs or organizations: Not on file    Relationship status: Not on file  . Intimate partner violence    Fear of current or ex partner: Not on file    Emotionally abused: Not on file    Physically abused: Not on file    Forced sexual activity: Not on file  Other Topics Concern  . Not on file  Social History  Narrative  . Not on file    Marcial Pacas, M.D. Ph.D.  [facility] [address] [place], [place] [address] Ph: [phone] Fax: [phone]  CC: Alycia Rossetti, MD

## 2018-10-31 ENCOUNTER — Other Ambulatory Visit (INDEPENDENT_AMBULATORY_CARE_PROVIDER_SITE_OTHER): Payer: Self-pay

## 2018-10-31 ENCOUNTER — Telehealth: Payer: Self-pay | Admitting: *Deleted

## 2018-10-31 ENCOUNTER — Other Ambulatory Visit: Payer: Self-pay

## 2018-10-31 DIAGNOSIS — R51 Headache: Secondary | ICD-10-CM | POA: Diagnosis not present

## 2018-10-31 DIAGNOSIS — Z0289 Encounter for other administrative examinations: Secondary | ICD-10-CM

## 2018-10-31 NOTE — Telephone Encounter (Signed)
Call placed to patient and patient made aware.   Letter extended.

## 2018-10-31 NOTE — Telephone Encounter (Signed)
Received call from patient.   Reports that she had video appointment with Dr. Krista Blue with Neuro on 10/30/2018. States that new medications were ordered (effexor, Maxalt, Zanaflex).   Reports that medications are helping her with her HA, but is still having Sx. States that Sx are slowly improving. Would like to extend work note through 11/02/2018 and return to work on 11/05/2018.  MD please advise.

## 2018-10-31 NOTE — Telephone Encounter (Signed)
Okay to end work note to  7/6 Put on note due to medication changes needs to be extended

## 2018-11-01 ENCOUNTER — Telehealth: Payer: Self-pay | Admitting: Neurology

## 2018-11-01 LAB — SEDIMENTATION RATE: Sed Rate: 53 mm/hr — ABNORMAL HIGH (ref 0–40)

## 2018-11-01 LAB — C-REACTIVE PROTEIN: CRP: 7 mg/L (ref 0–10)

## 2018-11-01 NOTE — Telephone Encounter (Signed)
Please check on her headaches, laboratory evaluation showed elevated ESR 53, normal C-reactive protein,  If her headache is much improved, no change in treatment plan, if she continue have significant headaches, consider repeat laboratory evaluation, follow-up visit in 3 to 4 weeks

## 2018-11-01 NOTE — Telephone Encounter (Signed)
Spoke to patient and notified her of the lab results.  Reports her headaches have improved but not resolved completely.  She would like to schedule a follow up with Dr. Krista Blue.  She has a pending follow up on 11/22/2018.

## 2018-11-05 MED ORDER — SUMATRIPTAN SUCCINATE 6 MG/0.5ML ~~LOC~~ SOLN: 6.0000 mg | SUBCUTANEOUS | 6 refills | Status: DC | PRN

## 2018-11-05 MED ORDER — TIZANIDINE HCL 4 MG PO TABS: 4.0000 mg | ORAL_TABLET | Freq: Four times a day (QID) | ORAL | 0 refills | Status: DC | PRN

## 2018-11-05 MED ORDER — AIMOVIG 70 MG/ML ~~LOC~~ SOAJ: 70.0000 mg | SUBCUTANEOUS | 11 refills | Status: DC

## 2018-11-05 NOTE — Telephone Encounter (Signed)
Signed MD orders provided to [facility] for the following IV medications.  1) Depacon 1000mg  2) Compazine 10mg  3) Toradol 30mg 

## 2018-11-05 NOTE — Telephone Encounter (Signed)
She tried rizatriptan at 10am this morning.  She repeated the dose again at noon along with Aleve and Zofran.  Prior to medications, she rated her migraine a 10 on the pain scale.  Now, she is rating it at 7.  She is unable to drive and does not have a way into the office.  She is hopeful for other suggestions on how to rid her pain.

## 2018-11-05 NOTE — Addendum Note (Signed)
Addended by: Desmond Lope on: 11/05/2018 04:42 PM   Modules accepted: Orders

## 2018-11-05 NOTE — Telephone Encounter (Signed)
I have spoken with the patient this morning.  She is unable to tolerate venlafaxine.  She tried the medication for four days but had nausea, vomiting, dizziness and shakiness shortly after each dose.  She has stopped the medication.  Additionally, she is having a severe migraine.  Reports pain has been debilitating since yesterday.  She has been experiencing nausea, vomiting, sensitivity to noise/light, dizziness and blurred vision.  She has not tried any of her home medications yet.

## 2018-11-05 NOTE — Addendum Note (Signed)
Addended by: Desmond Lope on: 11/05/2018 09:42 AM   Modules accepted: Orders

## 2018-11-05 NOTE — Telephone Encounter (Signed)
I have returned the call to the patient. She is asking to come in for treatment with IV medications.  [facility] can work her in at [facility] on 11/06/2018.  She can get a driver to the office at that time.  Her only allergy is venlafaxine which recently caused nausea and vomiting.

## 2018-11-05 NOTE — Addendum Note (Signed)
Addended by: Marcial Pacas on: 11/05/2018 04:09 PM   Modules accepted: Orders

## 2018-11-05 NOTE — Telephone Encounter (Signed)
Per vo by Dr. Krista Blue, she may try rizatriptan in hopes that it will break the migraine cycle.  If not, then she can repeat the rizatriptan, once more, in two hours along with Aleve, ondansetron and Benadryl, if needed.  If her home medications do not work, she was instructed to call our office back.  She verbalized understanding of this plan.  Additionally, she confirmed that she does not have any past history of heart attack or stroke.

## 2018-11-05 NOTE — Telephone Encounter (Signed)
1. I have Rx Aimovig 70mg  SQ every 30 days as migraine prevention.  2. Imitrex SQ 6mg  as needed, she used Maxalt 10mg  twice already today, starting from tomorrow July 7th  May  Mix imitrex with zofran, tizanidine, aleve,   3. Or she can come in tomorrow for infusion/nerve block

## 2018-11-05 NOTE — Telephone Encounter (Signed)
Patient called and requested to speak with Sharyn Lull RN regarding the new medication she was taking over the weekend, she states that it made her very sick. She was experiencing nausea, lack of appetite, vomiting, etc. Please call and advise.

## 2018-11-06 ENCOUNTER — Encounter: Payer: Self-pay | Admitting: *Deleted

## 2018-11-06 DIAGNOSIS — G43001 Migraine without aura, not intractable, with status migrainosus: Secondary | ICD-10-CM | POA: Diagnosis not present

## 2018-11-06 NOTE — Telephone Encounter (Signed)
Pt has asked for a call from [place] to discuss her other medications other than her IV meds and what she needs to do

## 2018-11-06 NOTE — Telephone Encounter (Addendum)
She received some relief with her infusion this morning but her migraine is still not gone completely.  She rates her pain at 5 on the pain scale.  She is still nauseated.  She is going to try her oral, home remedy of the following: sumatriptan with zofran, tizanidine, aleve (confirmed it is okay with Dr. Krista Blue).  She will call back if this does not relieve her pain.  She is requesting a letter to be out of work this week.  Dr. Krista Blue has also approved the letter. It will be sent through mychart.

## 2018-11-07 ENCOUNTER — Telehealth: Payer: Self-pay | Admitting: Neurology

## 2018-11-07 ENCOUNTER — Telehealth: Payer: Self-pay | Admitting: *Deleted

## 2018-11-07 NOTE — Telephone Encounter (Signed)
Pt would like RN to call her to discuss preventative medication options for her headaches. Please advise.

## 2018-11-07 NOTE — Telephone Encounter (Signed)
PA for Aimovig completed on covermymeds (key: [id number]).  Pt has coverage with [employer] (EL#[id number]).  Approved through 02/04/2019.

## 2018-11-07 NOTE — Telephone Encounter (Signed)
PA for Aimovig completed on covermymeds (key: [id number]).  Pt has coverage with [employer] (VP#[id number]).  Approved through 02/04/2019.   She will go to the pharmacy to pick up the [place].  She is feeling better today.  She did wake up with a mild headache and took one tablet of rizatriptan 10mg  that resolved the remainder of her pain.

## 2018-11-14 ENCOUNTER — Ambulatory Visit (INDEPENDENT_AMBULATORY_CARE_PROVIDER_SITE_OTHER): Payer: BC Managed Care – PPO | Admitting: Student

## 2018-11-14 ENCOUNTER — Encounter: Payer: Self-pay | Admitting: Student

## 2018-11-14 ENCOUNTER — Other Ambulatory Visit: Payer: Self-pay

## 2018-11-14 VITALS — BP 168/94 | HR 94 | Temp 98.0°F | Ht 62.0 in | Wt 297.0 lb

## 2018-11-14 DIAGNOSIS — I480 Paroxysmal atrial fibrillation: Secondary | ICD-10-CM | POA: Diagnosis not present

## 2018-11-14 DIAGNOSIS — E782 Mixed hyperlipidemia: Secondary | ICD-10-CM | POA: Diagnosis not present

## 2018-11-14 DIAGNOSIS — I10 Essential (primary) hypertension: Secondary | ICD-10-CM

## 2018-11-14 DIAGNOSIS — R079 Chest pain, unspecified: Secondary | ICD-10-CM | POA: Diagnosis not present

## 2018-11-14 MED ORDER — ATENOLOL 50 MG PO TABS: 50.0000 mg | ORAL_TABLET | Freq: Two times a day (BID) | ORAL | 1 refills | Status: DC

## 2018-11-14 NOTE — Progress Notes (Signed)
Cardiology Office Note    Date:  11/14/2018   ID:  Monique Gomez, DOB 07/21/1955, MRN [id number]  PCP:  Alycia Rossetti, MD  Cardiologist: Rozann Lesches, MD    Chief Complaint  Patient presents with  . Follow-up    Elevated BP    History of Present Illness:    Monique Gomez is a 63 y.o. female with past medical history of paroxysmal atrial fibrillation (on Eliquis), HTN, HLD, Type II DM, and GERD who presents to the office today for evaluation of elevated BP.  She was last examined by myself in 09/2017 for hospital follow-up from a recent admission for chest discomfort during which she had ruled out for ACS. She was overall doing well the time of her follow-up visit and denied any recurrent symptoms. Was under increased stress at that time due to the recent deaths of her brother and mother. She was continued on her current medication regimen including Atenolol 25 mg daily, Lisinopril 10 mg daily, Atorvastatin 20 mg daily, and Eliquis 5 mg twice daily.  She did follow-up with her PCP in the interim for blood pressure control and Atenolol had been increased to 50 mg daily and Lisinopril to 20 mg daily.  In talking with the patient today she reports worsening migraine headaches over the past few months. Underwent CT and MRI which were unrevealing and was eventually referred to Neurology. Recently started on Imitrex and notes some improvement in her symptoms over the past few days. When experiencing significant pain, she reports her SBP would peak into the 190's which led to dose titration of her medications as described above. Reports having a headache at the time of her visit today and BP at 168/94.  She denies any recent exertional chest pain or dyspnea on exertion. Did have episodes of left arm pain several months ago which was worse with positional changes. Radiated under her left breast and into her shoulder and was worse when reaching for objects. No association with  exertion and she denies any recurrent symptoms since. No recent orthopnea, PND, or lower extremity edema.     Past Medical History:  Diagnosis Date  . Allergic rhinitis   . Anemia   . Essential hypertension, benign   . GERD (gastroesophageal reflux disease)   . Headache(784.0)   . History of cardiac catheterization    No significant CAD May 2015  . History of migraine headaches   . Hyperlipidemia   . Obesity   . PAF (paroxysmal atrial fibrillation) ([place]) 08/2013  . Type 2 diabetes mellitus ([place])     Past Surgical History:  Procedure Laterality Date  . CATARACT EXTRACTION    . COLONOSCOPY N/A 02/02/2015   SLF: 1. one colon polyp removed-no source for anemia identified. 2. moderate diverticulosis noted in the sigmoid colon and descending colon 3. the left colon is redundant 4. Rectal bleeding due ot small internal hemorroids 5. Moderate sized external hemorrhoids.  . ESOPHAGOGASTRODUODENOSCOPY N/A 02/02/2015   SLF: 1. 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 large hiatal hernia 3. mild non-erosive gastritis 4. No source for anemia identified.   [facility] LEFT HEART CATHETERIZATION WITH CORONARY ANGIOGRAM N/A 09/03/2013   Procedure: LEFT HEART CATHETERIZATION WITH CORONARY ANGIOGRAM;  Surgeon: Jettie Booze, MD;  Location: [facility] CATH LAB;  Service: Cardiovascular;  Laterality: N/A;  . Right carpal tunnel release      Current Medications: Outpatient Medications Prior to Visit  Medication Sig Dispense Refill  . atorvastatin (LIPITOR) 40 MG  tablet Take 1 tablet (40 mg total) by mouth daily. 90 tablet 3  . ELIQUIS 5 MG TABS tablet TAKE 1 TABLET BY MOUTH TWICE DAILY 60 tablet 11  . Erenumab-aooe (AIMOVIG) 70 MG/ML SOAJ Inject 70 mg into the skin every 30 (thirty) days. 1 pen 11  . Iron-FA-B Cmp-C-Biot-Probiotic (FUSION PLUS) CAPS Take 2 tablets by mouth daily. 60 capsule 3  . lisinopril (ZESTRIL) 20 MG tablet Take 1 tablet (20 mg total) by mouth daily. 90 tablet 1  . metFORMIN  (GLUCOPHAGE) 500 MG tablet Take 1 tablet (500 mg total) by mouth 2 (two) times daily with a meal. 180 tablet 3  . metoCLOPramide (REGLAN) 10 MG tablet Take 1 tablet (10 mg total) by mouth every 8 (eight) hours as needed for up to 7 days for nausea (Headache). 21 tablet 0  . ondansetron (ZOFRAN ODT) 4 MG disintegrating tablet Take 1 tablet (4 mg total) by mouth every 8 (eight) hours as needed. 20 tablet 6  . pantoprazole (PROTONIX) 40 MG tablet TAKE 1 TABLET BY MOUTH TWICE DAILY 180 tablet 3  . SUMAtriptan (IMITREX) 6 MG/0.5ML SOLN injection Inject 0.5 mLs (6 mg total) into the skin every 2 (two) hours as needed for migraine or headache. May repeat in 2 hours if headache persists or recurs. 6 mL 6  . tiZANidine (ZANAFLEX) 4 MG tablet Take 1 tablet (4 mg total) by mouth every 6 (six) hours as needed for muscle spasms. 30 tablet 0  . topiramate (TOPAMAX) 25 MG tablet Take 1-2 tablets at bedtime for migraines 60 tablet 2  . atenolol (TENORMIN) 50 MG tablet Take 1 tablet (50 mg total) by mouth daily. 90 tablet 1  . rizatriptan (MAXALT-MLT) 10 MG disintegrating tablet Take 1 tablet (10 mg total) by mouth as needed. May repeat in 2 hours if needed 12 tablet 6   No facility-administered medications prior to visit.      Allergies:   Venlafaxine   Social History   Socioeconomic History  . Marital status: Single    Spouse name: Not on file  . Number of children: Not on file  . Years of education: Not on file  . Highest education level: Not on file  Occupational History  . Occupation: Training and development officer: [employer]  . Financial resource strain: Not on file  . Food insecurity    Worry: Not on file    Inability: Not on file  . Transportation needs    Medical: Not on file    Non-medical: Not on file  Tobacco Use  . Smoking status: Former Smoker    Packs/day: 0.30    Types: Cigarettes    Quit date: 04/01/2012    Years since quitting: 6.6  . Smokeless tobacco: Never  Used  Substance and Sexual Activity  . Alcohol use: Not Currently    Alcohol/week: 0.0 standard drinks    Comment: Wine typically 2-3 glasses every few weekends  . Drug use: No  . Sexual activity: Yes  Lifestyle  . Physical activity    Days per week: Not on file    Minutes per session: Not on file  . Stress: Not on file  Relationships  . Social Herbalist on phone: Not on file    Gets together: Not on file    Attends religious service: Not on file    Active member of club or organization: Not on file    Attends meetings of clubs  or organizations: Not on file    Relationship status: Not on file  Other Topics Concern  . Not on file  Social History Narrative  . Not on file     Family History:  The patient's family history includes Diabetes in her mother; Heart disease in her mother; Hypertension in her brother and mother.   Review of Systems:   Please see the history of present illness.     General:  No chills, fever, night sweats or weight changes.  Cardiovascular:  No chest pain, dyspnea on exertion, edema, orthopnea, palpitations, paroxysmal nocturnal dyspnea. Dermatological: No rash, lesions/masses Respiratory: No cough, dyspnea Urologic: No hematuria, dysuria Abdominal:   No nausea, vomiting, diarrhea, bright red blood per rectum, melena, or hematemesis Neurologic:  No visual changes, wkns, changes in mental status. Positive for headaches.   All other systems reviewed and are otherwise negative except as noted above.   Physical Exam:    VS:  BP (!) 168/94   Pulse 94   Temp 98 F (36.7 C)   Ht 5\' 2"  (1.575 m)   Wt 297 lb (134.7 kg)   BMI 54.32 kg/m    General: Well developed, well nourished,female appearing in no acute distress. Head: Normocephalic, atraumatic, sclera non-icteric, no xanthomas, nares are without discharge.  Neck: No carotid bruits. JVD not elevated.  Lungs: Respirations regular and unlabored, without wheezes or rales.  Heart: Regular  rate and rhythm. No S3 or S4.  No murmur, no rubs, or gallops appreciated. Abdomen: Soft, non-tender, non-distended with normoactive bowel sounds. No hepatomegaly. No rebound/guarding. No obvious abdominal masses. Msk:  Strength and tone appear normal for age. No joint deformities or effusions. Extremities: No clubbing or cyanosis. No lower extremity edema.  Distal pedal pulses are 2+ bilaterally. Neuro: Alert and oriented X 3. Moves all extremities spontaneously. No focal deficits noted. Psych:  Responds to questions appropriately with a normal affect. Skin: No rashes or lesions noted  Wt Readings from Last 3 Encounters:  11/14/18 297 lb (134.7 kg)  10/24/18 294 lb (133.4 kg)  10/22/18 (!) 301 lb (136.5 kg)     Studies/Labs Reviewed:   EKG:  EKG is ordered today.  The ekg ordered today demonstrates NSR, HR 82, with LVH and associated repol abnormalities with TWI along inferior leads and V3-V6 which is similar to her prior tracings.   Recent Labs: 10/09/2018: ALT 6 10/24/2018: BUN 13; Creat 0.97; Hemoglobin 12.7; Platelets 381; Potassium 4.7; Sodium 142; TSH 2.46   Lipid Panel    Component Value Date/Time   CHOL 186 10/09/2018 0939   TRIG 95 10/09/2018 0939   HDL 38 (L) 10/09/2018 0939   CHOLHDL 4.9 10/09/2018 0939   VLDL 13 08/20/2015 0935   LDLCALC 128 (H) 10/09/2018 0939    Additional studies/ records that were reviewed today include:   Echocardiogram: 08/2013 Study Conclusions   - Left ventricle: The cavity size was normal. Wall thickness  was increased in a pattern of mild to moderateLVH.  Systolic function was normal. The estimated ejection  fraction was in the range of 60% to 65%. Wall motion was  normal; there were no regional wall motion abnormalities.  The study is not technically sufficient to allow  evaluation of LV diastolic function.  - Aortic valve: Mildly calcified annulus. Trileaflet. No  significant regurgitation.  - Mitral valve: Calcified  annulus. Mild regurgitation.  - Left atrium: The atrium was at the upper limits of normal  in size.  - Right ventricle: The cavity  size was mildly dilated.  - Right atrium: Central venous pressure: 24mm Hg (est).  - Atrial septum: No defect or patent foramen ovale was  identified.  - Tricuspid valve: Mild regurgitation.  - Pulmonary arteries: PA peak pressure: 75mm Hg (S).  - Pericardium, extracardiac: There was no pericardial  effusion.   Assessment:    1. Paroxysmal atrial fibrillation (HCC)   2. Chest pain, unspecified type   3. Essential hypertension   4. Mixed hyperlipidemia      Plan:   In order of problems listed above:  1. Paroxysmal Atrial Fibrillation - she denies any recent palpitations and is maintaining NSR by examination and EKG today. Continue Atenolol with dose adjustment to 50 mg BID for BP control as outlined below.  - she denies any evidence of active bleeding. Continue Eliquis for anticoagulation.   2. Atypical Chest Pain - reports several months ago having discomfort under her left breast which radiated into her shoulder and was worse with positional changes or reaching for objects. No association with exertion. No recurrent episodes since. EKG today shows LVH with repol and no acute changes.  - overall her previous episode seems atypical for angina and most consistent with a MSK etiology. If she developed recurrent symptoms, could consider a repeat stress test as her last study was 5+ years ago and she does have multiple cardiac risk factors.   3. HTN - BP has been elevated as outlined above, likely driven by her significant migraines and associated pain. Currently on Atenolol 50mg  daily and Lisinopril 20mg  daily. Will further titrate Atenolol to 50mg  BID. I have asked her to keep a BP log and report back on her readings. Once her migraine headaches have improved, perhaps this can be reduced back to her current dose.   4. HLD - followed by PCP. FLP last  month showed LDL was elevated to 128 and Atorvastatin was titrated from 20mg  daily to 40mg  daily.   Medication Adjustments/Labs and Tests Ordered: Current medicines are reviewed at length with the patient today.  Concerns regarding medicines are outlined above.  Medication changes, Labs and Tests ordered today are listed in the Patient Instructions below. Patient Instructions  Medication Instructions:  INCREASE ATENOLOL TO 50 MG- TWO TIMES DAILY   Labwork: NONE  Testing/Procedures: NONE  Follow-Up: Your physician recommends that you schedule a follow-up appointment in: 2-3 MONTHS    Any Other Special Instructions Will Be Listed Below (If Applicable).  PLEASE KEEP BLOOD PRESSURE LOG FOR A FEW WEEKS, THEN CALL OR DROP THEM OFF FOR PHYSICIAN TO REVIEW    If you need a refill on your cardiac medications before your next appointment, please call your pharmacy.      Signed, Erma Heritage, PA-C  11/14/2018 8:14 PM    [place] [address] [place], [place] [address] Phone: [phone] Fax: [phone]

## 2018-11-14 NOTE — Patient Instructions (Signed)
Medication Instructions:  INCREASE ATENOLOL TO 50 MG- TWO TIMES DAILY   Labwork: NONE  Testing/Procedures: NONE  Follow-Up: Your physician recommends that you schedule a follow-up appointment in: 2-3 MONTHS    Any Other Special Instructions Will Be Listed Below (If Applicable).  PLEASE KEEP BLOOD PRESSURE LOG FOR A FEW WEEKS, THEN CALL OR DROP THEM OFF FOR PHYSICIAN TO REVIEW    If you need a refill on your cardiac medications before your next appointment, please call your pharmacy.

## 2018-11-15 ENCOUNTER — Other Ambulatory Visit: Payer: Self-pay

## 2018-11-16 ENCOUNTER — Encounter: Payer: Self-pay | Admitting: Family Medicine

## 2018-11-16 ENCOUNTER — Ambulatory Visit (INDEPENDENT_AMBULATORY_CARE_PROVIDER_SITE_OTHER): Payer: BC Managed Care – PPO | Admitting: Family Medicine

## 2018-11-16 VITALS — BP 140/92 | HR 73 | Temp 98.3°F | Resp 15 | Ht 62.0 in | Wt 295.0 lb

## 2018-11-16 DIAGNOSIS — IMO0002 Reserved for concepts with insufficient information to code with codable children: Secondary | ICD-10-CM

## 2018-11-16 DIAGNOSIS — I10 Essential (primary) hypertension: Secondary | ICD-10-CM | POA: Diagnosis not present

## 2018-11-16 DIAGNOSIS — G43709 Chronic migraine without aura, not intractable, without status migrainosus: Secondary | ICD-10-CM | POA: Diagnosis not present

## 2018-11-16 NOTE — Assessment & Plan Note (Signed)
Improved BP with additional atenolol  Reviewed cardiology note [facility] form completed for time out from June to July

## 2018-11-16 NOTE — Progress Notes (Signed)
   Subjective:    Patient ID: Monique Gomez, female    DOB: 1956/04/23, 63 y.o.   MRN: [id number]  Patient presents for Migraine (Patient in for a 4 week follow up)  Patient here for interim follow-up on her blood pressure and her migraines.  She was sent to for ongoing headaches. .  She had negative MRI of the brain.  She was also seen by her cardiologist for the blood pressure they increased her atenolol to 50 mg twice a day.Seen by neurology, contiued on topamax given migraine cocktail sumatriptan prm injection and , started on AMiovig injections monthly. Headaches have improved, but improved. She still gets dizzy spells, nausea episodes and fatigue.  BP 153/86 yesterday   No new concerns  Has [facility] form 6/12-7/13/20    Review Of Systems:  GEN- denies fatigue, fever, weight loss,weakness, recent illness HEENT- denies eye drainage, change in vision, nasal discharge, CVS- denies chest pain, palpitations RESP- denies SOB, cough, wheeze ABD- denies N/V, change in stools, abd pain GU- denies dysuria, hematuria, dribbling, incontinence MSK- denies joint pain, muscle aches, injury Neuro- + headache,+ dizziness, syncope, seizure activity       Objective:    BP (!) 140/92   Pulse 73   Temp 98.3 F (36.8 C) (Oral)   Resp 15   Ht 5\' 2"  (1.575 m)   Wt 295 lb (133.8 kg)   SpO2 98%   BMI 53.96 kg/m  GEN- NAD, alert and oriented x3  148/96 Left arm  HEENT- PERRL, EOMI, non injected sclera, pink conjunctiva, MMM, oropharynx clear CVS- RRR, no murmur RESP-CTAB EXT- No edema Pulses- Radial  2+         Assessment & Plan:      Problem List Items Addressed This Visit      Unprioritized   Chronic migraine - Primary    Continue topamax, Amiovig, migraine cocktail      RESOLVED: Chronic migraine   Essential hypertension, benign    Improved BP with additional atenolol  Reviewed cardiology note [facility] form completed for time out from June to July         Note: This  dictation was prepared with Dragon dictation along with smaller phrase technology. Any transcriptional errors that result from this process are unintentional.

## 2018-11-16 NOTE — Patient Instructions (Signed)
F/U as previous  Continue your current medications

## 2018-11-16 NOTE — Assessment & Plan Note (Signed)
Continue topamax, Amiovig, migraine cocktail

## 2018-11-19 ENCOUNTER — Telehealth: Payer: Self-pay | Admitting: Cardiology

## 2018-11-19 NOTE — Telephone Encounter (Signed)
Pt will call back in 1 week with BP readings 

## 2018-11-19 NOTE — Telephone Encounter (Signed)
Please give pt a call concerning high BP readings from the weekend. Pt can be reached @ [phone]

## 2018-11-19 NOTE — Telephone Encounter (Signed)
    We just increased her Atenolol five days ago and it can take 2-3 weeks to see the full effect of the dose increase. Please have her continue to follow readings and document the time of day with her elevated readings. If no improvement in the next week or so, we may need to further adjust her Lisinopril.   Signed, Erma Heritage, PA-C 11/19/2018, 4:12 PM Pager: [phone]

## 2018-11-19 NOTE — Telephone Encounter (Signed)
140/92, 163/98, 150/93, 168/65  States she bp headache this am, not her migraine type headache

## 2018-11-22 ENCOUNTER — Encounter: Payer: Self-pay | Admitting: Neurology

## 2018-11-22 ENCOUNTER — Ambulatory Visit: Payer: BC Managed Care – PPO | Admitting: Neurology

## 2018-11-22 ENCOUNTER — Other Ambulatory Visit: Payer: Self-pay

## 2018-11-22 VITALS — BP 136/83 | HR 77 | Temp 97.5°F | Ht 62.0 in | Wt 288.5 lb

## 2018-11-22 DIAGNOSIS — G43709 Chronic migraine without aura, not intractable, without status migrainosus: Secondary | ICD-10-CM | POA: Diagnosis not present

## 2018-11-22 DIAGNOSIS — IMO0002 Reserved for concepts with insufficient information to code with codable children: Secondary | ICD-10-CM

## 2018-11-22 MED ORDER — AIMOVIG 140 MG/ML ~~LOC~~ SOAJ: 140.0000 mg | SUBCUTANEOUS | 11 refills | Status: DC

## 2018-11-22 MED ORDER — TOPIRAMATE 50 MG PO TABS: 100.0000 mg | ORAL_TABLET | Freq: Every day | ORAL | 11 refills | Status: DC

## 2018-11-22 NOTE — Progress Notes (Signed)
PATIENT: Monique Gomez DOB: Oct 05, 1955  Chief Complaint  Patient presents with  . Migraine    Reports that she is still having daily headaches.  She very rarely uses Advil but usually tries to tolerable the pain.  She is using triptans twice weekly (either oral rizatriptan or injectable sumatriptan).  She has only used one injection of Aimovig 70mg .  She has continued taking Topamax 25mg , two tablets at bedtime. Her last significant migraine failed to respond to home meds and required an infusion thorugh our office.     HISTORICAL  Monique Gomez is a 63 year old female, seen in request by her primary care physician Dr. Buelah Manis, Lonell Grandchild F for evaluation of frequent headaches, initial evaluation through virtual visit on October 30, 2018.  I have reviewed and summarized the referring note from the referring physician.  She had past medical history of hypertension, hyperlipidemia, diabetes since 2018, atrial fibrillation, taking Eliquis, denies history of stroke or coronary artery disease  She reported history of migraine headache when she was 20s and 30s, she had severe frequent headaches at that time, but much improved, over the past couple months, she complains of gradual worsening headaches, especially since June 2020, she had a persistent headache for 3 weeks, she complains of daily retro-orbital area, occipital area pressure headaches, movement made it worse, with associated light noise sound sensitivity,  She does complains of anxiety especially with COVID 19, she works as a Education officer, [employer]  She was started on Topamax 25 mg 2 tablets every night as preventive medication, which helps her, was given prescription of Imitrex 100 mg as needed, she tried few times, did not help her headache much, complains of worsening nausea  Previously tried Fioricet with limited help  She was seen by her primary care physician, had MRI and CT head, personally reviewed MRI of the brain on October 23, 2018,  no acute abnormality, mild small vessel disease, partially empty sella CT of maxillary sinus, there was no evidence of acute abnormality  Laboratory evaluations in June 2020: BMP showed mild elevated glucose 121, CBC showed hemoglobin of 12.7, WBC of 11.4, normal TSH, A1c of 6.6,  UPDATE November 22 2018: She was started on Effexor, could not tolerated due to side effect, was later switched to Topamax, 25 mg titrating to 2 tablets every night, also add on Aimovig 70 mg every month, she reported moderate improvement of her headaches, Maxalt along with Zofran works for her headaches, but caused drowsiness, she has to use Imitrex injection 2-3 times each week for rescue therapy, which works for her migraine, she office for IV infusion for abortive treatment, which works well for her,  REVIEW OF SYSTEMS: Full 14 system review of systems performed and notable only for as above All other review of systems were negative.  ALLERGIES: Allergies  Allergen Reactions  . Venlafaxine Nausea And Vomiting and Other (See Comments)    Dizziness, shakiness    HOME MEDICATIONS: Current Outpatient Medications  Medication Sig Dispense Refill  . atenolol (TENORMIN) 50 MG tablet Take 1 tablet (50 mg total) by mouth 2 (two) times daily. 180 tablet 1  . atorvastatin (LIPITOR) 40 MG tablet Take 1 tablet (40 mg total) by mouth daily. 90 tablet 3  . ELIQUIS 5 MG TABS tablet TAKE 1 TABLET BY MOUTH TWICE DAILY 60 tablet 11  . Erenumab-aooe (AIMOVIG) 70 MG/ML SOAJ Inject 70 mg into the skin every 30 (thirty) days. 1 pen 11  . Iron-FA-B Cmp-C-Biot-Probiotic (FUSION PLUS)  CAPS Take 2 tablets by mouth daily. 60 capsule 3  . lisinopril (ZESTRIL) 20 MG tablet Take 1 tablet (20 mg total) by mouth daily. 90 tablet 1  . metFORMIN (GLUCOPHAGE) 500 MG tablet Take 1 tablet (500 mg total) by mouth 2 (two) times daily with a meal. 180 tablet 3  . metoCLOPramide (REGLAN) 10 MG tablet Take 1 tablet (10 mg total) by mouth every 8 (eight)  hours as needed for up to 7 days for nausea (Headache). 21 tablet 0  . ondansetron (ZOFRAN ODT) 4 MG disintegrating tablet Take 1 tablet (4 mg total) by mouth every 8 (eight) hours as needed. 20 tablet 6  . pantoprazole (PROTONIX) 40 MG tablet TAKE 1 TABLET BY MOUTH TWICE DAILY 180 tablet 3  . SUMAtriptan (IMITREX) 6 MG/0.5ML SOLN injection Inject 0.5 mLs (6 mg total) into the skin every 2 (two) hours as needed for migraine or headache. May repeat in 2 hours if headache persists or recurs. 6 mL 6  . tiZANidine (ZANAFLEX) 4 MG tablet Take 1 tablet (4 mg total) by mouth every 6 (six) hours as needed for muscle spasms. 30 tablet 0  . topiramate (TOPAMAX) 25 MG tablet Take 1-2 tablets at bedtime for migraines 60 tablet 2   No current facility-administered medications for this visit.     PAST MEDICAL HISTORY: Past Medical History:  Diagnosis Date  . Allergic rhinitis   . Anemia   . Essential hypertension, benign   . GERD (gastroesophageal reflux disease)   . Headache(784.0)   . History of cardiac catheterization    No significant CAD May 2015  . History of migraine headaches   . Hyperlipidemia   . Obesity   . PAF (paroxysmal atrial fibrillation) ([place]) 08/2013  . Type 2 diabetes mellitus ([place])     PAST SURGICAL HISTORY: Past Surgical History:  Procedure Laterality Date  . CATARACT EXTRACTION    . COLONOSCOPY N/A 02/02/2015   SLF: 1. one colon polyp removed-no source for anemia identified. 2. moderate diverticulosis noted in the sigmoid colon and descending colon 3. the left colon is redundant 4. Rectal bleeding due ot small internal hemorroids 5. Moderate sized external hemorrhoids.  . ESOPHAGOGASTRODUODENOSCOPY N/A 02/02/2015   SLF: 1. 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 large hiatal hernia 3. mild non-erosive gastritis 4. No source for anemia identified.   [facility] Kitchen LEFT HEART CATHETERIZATION WITH CORONARY ANGIOGRAM N/A 09/03/2013   Procedure: LEFT HEART CATHETERIZATION WITH  CORONARY ANGIOGRAM;  Surgeon: Jettie Booze, MD;  Location: [facility] CATH LAB;  Service: Cardiovascular;  Laterality: N/A;  . Right carpal tunnel release      FAMILY HISTORY: Family History  Problem Relation Age of Onset  . Diabetes Mother   . Hypertension Mother   . Heart disease Mother   . Hypertension Brother   . Colon cancer Neg Hx     SOCIAL HISTORY: Social History   Socioeconomic History  . Marital status: Single    Spouse name: Not on file  . Number of children: Not on file  . Years of education: Not on file  . Highest education level: Not on file  Occupational History  . Occupation: Training and development officer: [employer]  . Financial resource strain: Not on file  . Food insecurity    Worry: Not on file    Inability: Not on file  . Transportation needs    Medical: Not on file    Non-medical: Not on file  Tobacco Use  . Smoking status: Former Smoker    Packs/day: 0.30    Types: Cigarettes    Quit date: 04/01/2012    Years since quitting: 6.6  . Smokeless tobacco: Never Used  Substance and Sexual Activity  . Alcohol use: Not Currently    Alcohol/week: 0.0 standard drinks    Comment: Wine typically 2-3 glasses every few weekends  . Drug use: No  . Sexual activity: Yes  Lifestyle  . Physical activity    Days per week: Not on file    Minutes per session: Not on file  . Stress: Not on file  Relationships  . Social Herbalist on phone: Not on file    Gets together: Not on file    Attends religious service: Not on file    Active member of club or organization: Not on file    Attends meetings of clubs or organizations: Not on file    Relationship status: Not on file  . Intimate partner violence    Fear of current or ex partner: Not on file    Emotionally abused: Not on file    Physically abused: Not on file    Forced sexual activity: Not on file  Other Topics Concern  . Not on file  Social History Narrative  . Not on file      PHYSICAL EXAM   Vitals:   11/22/18 0831  BP: 136/83  Pulse: 77  Temp: (!) 97.5 F (36.4 C)  Weight: 288 lb 8 oz (130.9 kg)  Height: 5\' 2"  (1.575 m)    Not recorded      Body mass index is 52.77 kg/m.  PHYSICAL EXAMNIATION:  Gen: NAD, conversant, well nourised, obese, well groomed                     Cardiovascular: Regular rate rhythm, no peripheral edema, warm, nontender. Eyes: Conjunctivae clear without exudates or hemorrhage Neck: Supple, no carotid bruits. Pulmonary: Clear to auscultation bilaterally   NEUROLOGICAL EXAM:  MENTAL STATUS: Speech:    Speech is normal; fluent and spontaneous with normal comprehension.  Cognition:     Orientation to time, place and person     Normal recent and remote memory     Normal Attention span and concentration     Normal Language, naming, repeating,spontaneous speech     Fund of knowledge   CRANIAL NERVES: CN II: Visual fields are full to confrontation.  Pupils are round equal and briskly reactive to light. CN III, IV, VI: extraocular movement are normal. No ptosis. CN V: Facial sensation is intact to pinprick in all 3 divisions bilaterally. Corneal responses are intact.  CN VII: Face is symmetric with normal eye closure and smile. CN VIII: Hearing is normal to rubbing fingers CN IX, X: Palate elevates symmetrically. Phonation is normal. CN XI: Head turning and shoulder shrug are intact CN XII: Tongue is midline with normal movements and no atrophy.  MOTOR: There is no pronator drift of out-stretched arms. Muscle bulk and tone are normal. Muscle strength is normal.  REFLEXES: Reflexes are 2+ and symmetric at the biceps, triceps, knees, and ankles. Plantar responses are flexor.  SENSORY: Intact to light touch, pinprick, positional sensation and vibratory sensation are intact in fingers and toes.  COORDINATION: Rapid alternating movements and fine finger movements are intact. There is no dysmetria on finger-to-nose  and heel-knee-shin.    GAIT/STANCE: Obese, push-up to get up from seated position, cautious,  DIAGNOSTIC  DATA (LABS, IMAGING, TESTING) - I reviewed patient records, labs, notes, testing and imaging myself where available.   ASSESSMENT AND PLAN  ZENOBIA KUENNEN is a 63 y.o. female   Chronic migraine headaches  Titrating Topamax to 50 mg 2 tablets every night as preventive medications,  Increase Aimovig to 140 mg once a month as preventive medications  Maxalt 10 mg as needed, plus Zofran needed, Imitrex subcutaneous injection  Return to clinic with nurse practitioner Sarah in 3 months   Marcial Pacas, M.D. Ph.D.  [facility] [address], [place], [place] [address] Ph: [phone] Fax: [phone]  CC: Referring Provider

## 2018-12-03 ENCOUNTER — Other Ambulatory Visit: Payer: Self-pay | Admitting: Neurology

## 2018-12-03 ENCOUNTER — Telehealth: Payer: Self-pay | Admitting: Neurology

## 2018-12-03 MED ORDER — SUMATRIPTAN SUCCINATE 6 MG/0.5ML ~~LOC~~ SOLN: 6.0000 mg | SUBCUTANEOUS | 6 refills | Status: DC | PRN

## 2018-12-03 NOTE — Telephone Encounter (Signed)
Pt is asking for a call from RN to discuss her Erenumab-aooe (AIMOVIG) [place]

## 2018-12-03 NOTE — Telephone Encounter (Signed)
Called the pt back and she asked if she could take the aimovig early. Advised that is she can hold out closer to the 28-30 day time frame that will be better. That would be this Thursday. Pt verbalized understanding. Patient also needed a refill for the imitrex sent to the pharmacy. Educated on making sure that she is using that for emergency use only and not for preventative. Pt verbalized understanding.

## 2018-12-03 NOTE — Telephone Encounter (Signed)
Called the patient back she had  Question about the aimovig and if she could take it early. Encouraged the patient to hold off closer to the end of the weeek. Cause it is suppose to be taken every 28-30 days. Pt verbalized understanding. Patient also states a refill is needed on her medication imitrex injection. Encouraged and educated to make sure only taking that as a emergent medication and not as a preventative. Pt verbalized understanding.

## 2018-12-10 ENCOUNTER — Telehealth: Payer: Self-pay | Admitting: Cardiology

## 2018-12-10 MED ORDER — ATENOLOL 25 MG PO TABS: 25.0000 mg | ORAL_TABLET | Freq: Two times a day (BID) | ORAL | 3 refills | Status: DC

## 2018-12-10 NOTE — Telephone Encounter (Signed)
Pt will decrease atenolol to 25 mg bid

## 2018-12-10 NOTE — Telephone Encounter (Signed)
New message   Pt c/o medication issue:  1. Name of Medication:  atenolol (TENORMIN) 50 MG tablet Take 1 tablet (50 mg total) by mouth 2 (two) times daily.     2. How are you currently taking this medication (dosage and times per day)? 2x  50 mg at 3am and 5pm  3. Are you having a reaction (difficulty breathing--STAT)? Yes   4. What is your medication issue? fatigue, nausea, depression, shaking , nervousness , cold feeling in feet

## 2018-12-10 NOTE — Telephone Encounter (Signed)
Pt's BP's are now 126/80, 124/82, 135/80 on atenolol 50 mg BID. She as no appetite, cut out most carbs, all salt and has lost 28 lbs in 2 months.She is thinking the atenolol is causing the nausea, fatigue (has known anemia) cold sensation in feet. I advised her to speak with her pcp who manages her anemia.She wants to decrease atenolol dose

## 2018-12-10 NOTE — Telephone Encounter (Signed)
    She can reduce back to 25mg  BID and continue to follow readings with this as titration of BB therapy can cause fatigue.   Signed, Erma Heritage, PA-C 12/10/2018, 11:15 AM Pager: [phone]

## 2018-12-19 ENCOUNTER — Telehealth: Payer: Self-pay | Admitting: Licensed Clinical Social Worker

## 2018-12-19 ENCOUNTER — Telehealth: Payer: Self-pay | Admitting: Cardiology

## 2018-12-19 NOTE — Telephone Encounter (Signed)
Per pt phone call-- pt has started to have some headaches again and she thinks her BP may be going up again. She no longer has a BP machine at home to check it. Requesting to speak w/ C[place].

## 2018-12-19 NOTE — Telephone Encounter (Signed)
CSW referred to assist patient with obtaining a BP cuff. CSW contacted patient to inform cuff will be delivered to home. Patient grateful for support and assistance. CSW available as needed. Jackie Cloy Cozzens, LCSW, CCSW-MCS [phone]  

## 2018-12-19 NOTE — Telephone Encounter (Signed)
I requested new blood pressure monitor for patient which she will call with readings.She is also going to take tylenol and mucinex for sinus congestion

## 2019-01-05 ENCOUNTER — Other Ambulatory Visit: Payer: Self-pay | Admitting: Family Medicine

## 2019-01-05 DIAGNOSIS — K219 Gastro-esophageal reflux disease without esophagitis: Secondary | ICD-10-CM

## 2019-01-09 ENCOUNTER — Telehealth: Payer: Self-pay | Admitting: Physician Assistant

## 2019-01-09 NOTE — Telephone Encounter (Signed)
Patient requesting return phone call due to questions concerning medications. / tg

## 2019-01-10 NOTE — Telephone Encounter (Signed)
Returned pt call. She stated that she thinks she is having some sinus issues and needs an antibiotic called in. I advised her to call her pcp to make an appointment. She stated she is taking mucinex and tylenol. She will call pcp.

## 2019-01-11 ENCOUNTER — Other Ambulatory Visit: Payer: Self-pay

## 2019-01-14 ENCOUNTER — Other Ambulatory Visit: Payer: Self-pay

## 2019-01-14 ENCOUNTER — Ambulatory Visit: Payer: BC Managed Care – PPO | Admitting: Family Medicine

## 2019-01-14 ENCOUNTER — Encounter: Payer: Self-pay | Admitting: Family Medicine

## 2019-01-14 VITALS — BP 128/66 | HR 78 | Temp 98.2°F | Resp 14 | Ht 62.0 in | Wt 285.0 lb

## 2019-01-14 DIAGNOSIS — Z23 Encounter for immunization: Secondary | ICD-10-CM

## 2019-01-14 DIAGNOSIS — I10 Essential (primary) hypertension: Secondary | ICD-10-CM

## 2019-01-14 DIAGNOSIS — E782 Mixed hyperlipidemia: Secondary | ICD-10-CM

## 2019-01-14 DIAGNOSIS — E119 Type 2 diabetes mellitus without complications: Secondary | ICD-10-CM

## 2019-01-14 DIAGNOSIS — J0100 Acute maxillary sinusitis, unspecified: Secondary | ICD-10-CM

## 2019-01-14 MED ORDER — AMOXICILLIN 875 MG PO TABS: 875.0000 mg | ORAL_TABLET | Freq: Two times a day (BID) | ORAL | 0 refills | Status: DC

## 2019-01-14 NOTE — Assessment & Plan Note (Signed)
He does not check her blood sugar regularly but has been losing weight is the lowest she has been in the past 4 years.  We will recheck her A1c today along with a urine microalbumin.  She is on ACE inhibitor and statin drug.

## 2019-01-14 NOTE — Patient Instructions (Addendum)
F/U 4 months for Physical Use flonase/nasocort  or netty pot  claritin Take amoxicillin if not improved

## 2019-01-14 NOTE — Progress Notes (Signed)
Subjective:    Patient ID: Monique Gomez, female    DOB: 02/18/1956, 63 y.o.   MRN: [id number]  Patient presents for Follow-up (is fasting) and Illness (sinus pressure, nasal congestion, cough)  Patient here to follow-up chronic medical problems.  Hypertension/paroxysmal atrial fibrillation she is being followed by cardiology.  She is taking all her medicines as prescribed blood pressure at home   Hyperlipidemia she is on Lipitor grams was increased back in June Migraines followed by neurology she is now on Aimovig and Topamax   Diabetes mellitus type 2 she is currently taking metformin 500 mg twice a day . She has not checked her blood sugar.     Her last A1c was 6.6%  she is on ACE inhibitor lisinopril  Iron deficiency anemia she is currently on iron 325 twice a day  Sinus pressure for past 2 weeks she has not used netty pot, no fever, using mucinex, tylenol   cough with post nasal drip  Pain on face   She does occ feel dizzy     Morbidly obese- she has bee trying to lose weight    Review Of Systems:  GEN- denies fatigue, fever, weight loss,weakness, recent illness HEENT- denies eye drainage, change in vision, nasal discharge, CVS- denies chest pain, palpitations RESP- denies SOB, cough, wheeze ABD- denies N/V, change in stools, abd pain GU- denies dysuria, hematuria, dribbling, incontinence MSK- denies joint pain, muscle aches, injury Neuro- denies headache, dizziness, syncope, seizure activity       Objective:    BP 128/66   Pulse 78   Temp 98.2 F (36.8 C) (Oral)   Resp 14   Ht 5\' 2"  (1.575 m)   Wt 285 lb (129.3 kg)   SpO2 98%   BMI 52.13 kg/m  GEN- NAD, alert and oriented x3 HEENT- PERRL, EOMI, non injected sclera, pink conjunctiva, MMM, oropharynx clear ,+ maxillary sinus tenderness, TM clear rhinorrhea, TM Clear bilat no effusion  Neck- Supple, no thyromegaly CVS- RRR, no murmur RESP-CTAB ABD-NABS,soft,NT,ND EXT- No edema Pulses- Radial, DP-  2+        Assessment & Plan:      Problem List Items Addressed This Visit      Unprioritized   Diabetes mellitus ([place])    He does not check her blood sugar regularly but has been losing weight is the lowest she has been in the past 4 years.  We will recheck her A1c today along with a urine microalbumin.  She is on ACE inhibitor and statin drug.      Relevant Orders   Hemoglobin A1c   HM DIABETES FOOT EXAM (Completed)   Microalbumin / creatinine urine ratio   Essential hypertension, benign - Primary    Blood pressure is controlled no change in medications.  Did discuss for her sinusitis for to try the allergy medications before taking the antibiotics.  She can use loratadine Nettie pot Flonase without raising her blood pressure she is to avoid the decongestant medications.      Relevant Orders   CBC with Differential/Platelet   Comprehensive metabolic panel   Hyperlipidemia   Relevant Orders   Lipid panel   Morbid obesity ([place])    Other Visit Diagnoses    Acute non-recurrent maxillary sinusitis       Relevant Medications   amoxicillin (AMOXIL) 875 MG tablet   Need for immunization against influenza       Relevant Orders   Flu Vaccine QUAD 36+ mos IM (Completed)  Note: This dictation was prepared with Dragon dictation along with smaller phrase technology. Any transcriptional errors that result from this process are unintentional.

## 2019-01-14 NOTE — Assessment & Plan Note (Signed)
Blood pressure is controlled no change in medications.  Did discuss for her sinusitis for to try the allergy medications before taking the antibiotics.  She can use loratadine Nettie pot Flonase without raising her blood pressure she is to avoid the decongestant medications.

## 2019-01-15 LAB — CBC WITH DIFFERENTIAL/PLATELET
Absolute Monocytes: 708 cells/uL (ref 200–950)
Basophils Absolute: 39 cells/uL (ref 0–200)
Basophils Relative: 0.4 %
Eosinophils Absolute: 146 cells/uL (ref 15–500)
Eosinophils Relative: 1.5 %
HCT: 40.5 % (ref 35.0–45.0)
Hemoglobin: 12 g/dL (ref 11.7–15.5)
Lymphs Abs: 1746 cells/uL (ref 850–3900)
MCH: 21.5 pg — ABNORMAL LOW (ref 27.0–33.0)
MCHC: 29.6 g/dL — ABNORMAL LOW (ref 32.0–36.0)
MCV: 72.6 fL — ABNORMAL LOW (ref 80.0–100.0)
MPV: 10.7 fL (ref 7.5–12.5)
Monocytes Relative: 7.3 %
Neutro Abs: 7062 cells/uL (ref 1500–7800)
Neutrophils Relative %: 72.8 %
Platelets: 315 10*3/uL (ref 140–400)
RBC: 5.58 10*6/uL — ABNORMAL HIGH (ref 3.80–5.10)
RDW: 15.6 % — ABNORMAL HIGH (ref 11.0–15.0)
Total Lymphocyte: 18 %
WBC: 9.7 10*3/uL (ref 3.8–10.8)

## 2019-01-15 LAB — MICROALBUMIN / CREATININE URINE RATIO
Creatinine, Urine: 285 mg/dL — ABNORMAL HIGH (ref 20–275)
Microalb Creat Ratio: 10 mcg/mg creat (ref <30)
Microalb, Ur: 2.9 mg/dL

## 2019-01-15 LAB — COMPREHENSIVE METABOLIC PANEL
AG Ratio: 1.2 (calc) (ref 1.0–2.5)
ALT: 9 U/L (ref 6–29)
AST: 14 U/L (ref 10–35)
Albumin: 3.8 g/dL (ref 3.6–5.1)
Alkaline phosphatase (APISO): 75 U/L (ref 37–153)
BUN/Creatinine Ratio: 12 (calc) (ref 6–22)
BUN: 12 mg/dL (ref 7–25)
CO2: 22 mmol/L (ref 20–32)
Calcium: 9.7 mg/dL (ref 8.6–10.4)
Chloride: 109 mmol/L (ref 98–110)
Creat: 1.01 mg/dL — ABNORMAL HIGH (ref 0.50–0.99)
Globulin: 3.2 g/dL (calc) (ref 1.9–3.7)
Glucose, Bld: 131 mg/dL — ABNORMAL HIGH (ref 65–99)
Potassium: 4.4 mmol/L (ref 3.5–5.3)
Sodium: 142 mmol/L (ref 135–146)
Total Bilirubin: 0.3 mg/dL (ref 0.2–1.2)
Total Protein: 7 g/dL (ref 6.1–8.1)

## 2019-01-15 LAB — HEMOGLOBIN A1C
Hgb A1c MFr Bld: 6.6 % of total Hgb — ABNORMAL HIGH (ref <5.7)
Mean Plasma Glucose: 143 (calc)
eAG (mmol/L): 7.9 (calc)

## 2019-01-15 LAB — LIPID PANEL
Cholesterol: 185 mg/dL (ref <200)
HDL: 36 mg/dL — ABNORMAL LOW (ref >=50)
LDL Cholesterol (Calc): 126 mg/dL (calc) — ABNORMAL HIGH
Non-HDL Cholesterol (Calc): 149 mg/dL (calc) — ABNORMAL HIGH (ref <130)
Total CHOL/HDL Ratio: 5.1 (calc) — ABNORMAL HIGH (ref <5.0)
Triglycerides: 120 mg/dL (ref <150)

## 2019-01-17 ENCOUNTER — Other Ambulatory Visit: Payer: Self-pay | Admitting: *Deleted

## 2019-01-17 MED ORDER — ATORVASTATIN CALCIUM 80 MG PO TABS: 80.0000 mg | ORAL_TABLET | Freq: Every day | ORAL | 3 refills | Status: DC

## 2019-01-17 MED ORDER — APIXABAN 5 MG PO TABS: 5.0000 mg | ORAL_TABLET | Freq: Two times a day (BID) | ORAL | 3 refills | Status: DC

## 2019-01-24 ENCOUNTER — Encounter: Payer: Self-pay | Admitting: Cardiology

## 2019-01-24 NOTE — Progress Notes (Signed)
Cardiology Office Note  Date: 01/25/2019   ID: [name], Gomez 03-12-1956, MRN [id number]  PCP:  Alycia Rossetti, MD  Cardiologist:  Rozann Lesches, MD Electrophysiologist:  None   Chief Complaint  Patient presents with  . Cardiac follow-up    History of Present Illness: Monique Gomez is a 63 y.o. female last seen by Ms. Strader PA-C in July.  She presents for a routine follow-up visit.  Fortunately, she states that she has been feeling better, tolerating her current medications and reports better blood pressure control.  Her systolic is in the 035K today.  She continues to follow closely with Dr. Buelah Manis.  I reviewed her medications.  Cardiac regimen includes and atenolol.  She does not report any spontaneous bleeding problems.  I reviewed her recent lab work as outlined below, renal function and hemoglobin are stable.  She had a follow-up ECG in July.  Past Medical History:  Diagnosis Date  . Allergic rhinitis   . Anemia   . Essential hypertension   . GERD (gastroesophageal reflux disease)   . Headache(784.0)   . History of cardiac catheterization    No significant CAD May 2015  . History of migraine headaches   . Hyperlipidemia   . Obesity   . PAF (paroxysmal atrial fibrillation) ([place]) 08/2013  . Type 2 diabetes mellitus ([place])     Past Surgical History:  Procedure Laterality Date  . CATARACT EXTRACTION    . COLONOSCOPY N/A 02/02/2015   SLF: 1. one colon polyp removed-no source for anemia identified. 2. moderate diverticulosis noted in the sigmoid colon and descending colon 3. the left colon is redundant 4. Rectal bleeding due ot small internal hemorroids 5. Moderate sized external hemorrhoids.  . ESOPHAGOGASTRODUODENOSCOPY N/A 02/02/2015   SLF: 1. 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 large hiatal hernia 3. mild non-erosive gastritis 4. No source for anemia identified.   Marland Kitchen LEFT HEART CATHETERIZATION WITH CORONARY ANGIOGRAM N/A 09/03/2013   Procedure: LEFT HEART CATHETERIZATION WITH CORONARY ANGIOGRAM;  Surgeon: Jettie Booze, MD;  Location: [facility] CATH LAB;  Service: Cardiovascular;  Laterality: N/A;  . Right carpal tunnel release      Current Outpatient Medications  Medication Sig Dispense Refill  . apixaban (ELIQUIS) 5 MG TABS tablet Take 1 tablet (5 mg total) by mouth 2 (two) times daily. 180 tablet 3  . atenolol (TENORMIN) 25 MG tablet Take 1 tablet (25 mg total) by mouth 2 (two) times daily. 180 tablet 3  . Erenumab-aooe (AIMOVIG) 140 MG/ML SOAJ Inject 140 mg into the skin every 30 (thirty) days. 1 pen 11  . Iron-FA-B Cmp-C-Biot-Probiotic (FUSION PLUS) CAPS Take 2 tablets by mouth daily. 60 capsule 3  . lisinopril (ZESTRIL) 20 MG tablet Take 1 tablet (20 mg total) by mouth daily. 90 tablet 1  . metFORMIN (GLUCOPHAGE) 500 MG tablet Take 1 tablet (500 mg total) by mouth 2 (two) times daily with a meal. 180 tablet 3  . ondansetron (ZOFRAN ODT) 4 MG disintegrating tablet Take 1 tablet (4 mg total) by mouth every 8 (eight) hours as needed. 20 tablet 6  . pantoprazole (PROTONIX) 40 MG tablet Take 1 tablet by mouth twice daily 180 tablet 0  . SUMAtriptan (IMITREX) 6 MG/0.5ML SOLN injection Inject 0.5 mLs (6 mg total) into the skin every 2 (two) hours as needed for migraine or headache. May repeat in 2 hours if headache persists or recurs. 6 mL 6  . tiZANidine (ZANAFLEX) 4 MG tablet Take 1  tablet (4 mg total) by mouth every 6 (six) hours as needed for muscle spasms. 30 tablet 0  . topiramate (TOPAMAX) 50 MG tablet Take 2 tablets (100 mg total) by mouth at bedtime. Take 1-2 tablets at bedtime for migraines 60 tablet 11  . atorvastatin (LIPITOR) 80 MG tablet Take 1 tablet (80 mg total) by mouth daily. 90 tablet 3  . metoCLOPramide (REGLAN) 10 MG tablet Take 1 tablet (10 mg total) by mouth every 8 (eight) hours as needed for up to 7 days for nausea (Headache). 21 tablet 0   No current facility-administered medications for this visit.     Allergies:  Venlafaxine   Social History: The patient  reports that she quit smoking about 6 years ago. Her smoking use included cigarettes. She smoked 0.30 packs per day. She has never used smokeless tobacco. She reports previous alcohol use. She reports that she does not use drugs.   ROS:  Please see the history of present illness. Otherwise, complete review of systems is positive for intermittent migraines.  All other systems are reviewed and negative.   Physical Exam: VS:  BP 131/76   Pulse 63   Temp (!) 97.1 F (36.2 C)   Ht _0  (1.575 m)   Wt 284 lb (128.8 kg)   SpO2 98%   BMI 51.94 kg/m , BMI Body mass index is 51.94 kg/m.  Wt Readings from Last 3 Encounters:  01/25/19 284 lb (128.8 kg)  01/25/19 285 lb (129.3 kg)  01/14/19 285 lb (129.3 kg)    General: Patient appears comfortable at rest. HEENT: Conjunctiva and lids normal, wearing a mask. Neck: Supple, no elevated JVP or carotid bruits, no thyromegaly. Lungs: Clear to auscultation, nonlabored breathing at rest. Cardiac: Regular rate and rhythm, no S3, 2/6 systolic murmur. Abdomen: Soft, nontender, bowel sounds present. Extremities: No pitting edema, distal pulses 2+. Skin: Warm and dry. Musculoskeletal: No kyphosis. Neuropsychiatric: Alert and oriented x3, affect grossly appropriate.  ECG:  An ECG dated 11/14/2018 was personally reviewed today and demonstrated:  Sinus rhythm with LVH and diffuse repolarization abnormalities.  Recent Labwork: 10/24/2018: TSH 2.46 01/14/2019: ALT 9; AST 14; BUN 12; Creat 1.01; Hemoglobin 12.0; Platelets 315; Potassium 4.4; Sodium 142     Component Value Date/Time   CHOL 185 01/14/2019 0837   TRIG 120 01/14/2019 0837   HDL 36 (L) 01/14/2019 0837   CHOLHDL 5.1 (H) 01/14/2019 0837   VLDL 13 08/20/2015 0935   LDLCALC 126 (H) 01/14/2019 0837    Other Studies Reviewed Today:  Echocardiogram 09/02/2013: Study Conclusions   - Left ventricle: The cavity size was normal. Wall  thickness  was increased in a pattern of mild to moderateLVH.  Systolic function was normal. The estimated ejection  fraction was in the range of 60% to 65%. Wall motion was  normal; there were no regional wall motion abnormalities.  The study is not technically sufficient to allow  evaluation of LV diastolic function.  - Aortic valve: Mildly calcified annulus. Trileaflet. No  significant regurgitation.  - Mitral valve: Calcified annulus. Mild regurgitation.  - Left atrium: The atrium was at the upper limits of normal  in size.  - Right ventricle: The cavity size was mildly dilated.  - Right atrium: Central venous pressure: 49m Hg (est).  - Atrial septum: No defect or patent foramen ovale was  identified.  - Tricuspid valve: Mild regurgitation.  - Pulmonary arteries: PA peak pressure: 345mHg (S).  - Pericardium, extracardiac: There was no pericardial  effusion.  Impressions:   - Mild to moderate LVH with LVEF 60-65%. Indeterminate  diastolic function. Upper normal left atrial size. MAC  with mild mitral regurgitation. Mild RV enlargement. Mild  tricuspid regurgitation with PASP 33 mmHg.   Assessment and Plan:  1.  Paroxysmal atrial fibrillation.  She reports no progressive palpitations and is tolerating current dose of atenolol along with Eliquis.  Recent lab work reviewed.  2.  Essential hypertension, systolic is in the 141C today.  She has been working on diet and weight loss, continues on medical therapy with follow-up by Dr. Buelah Manis.  3.  Essential hypertension, on Lipitor.  Last LDL 126.  Medication Adjustments/Labs and Tests Ordered: Current medicines are reviewed at length with the patient today.  Concerns regarding medicines are outlined above.   Tests Ordered: No orders of the defined types were placed in this encounter.   Medication Changes: No orders of the defined types were placed in this encounter.   Disposition:  Follow up in  person in the [place] office in 6 months.  Signed, Satira Sark, MD, [facility] 01/25/2019 2:12 PM    [facility] at [facility] [address], [place], [place] [address] Phone: [phone]; Fax: [phone]

## 2019-01-25 ENCOUNTER — Other Ambulatory Visit: Payer: Self-pay

## 2019-01-25 ENCOUNTER — Ambulatory Visit (INDEPENDENT_AMBULATORY_CARE_PROVIDER_SITE_OTHER): Payer: BC Managed Care – PPO | Admitting: Cardiology

## 2019-01-25 ENCOUNTER — Encounter: Payer: Self-pay | Admitting: Cardiology

## 2019-01-25 ENCOUNTER — Ambulatory Visit (HOSPITAL_COMMUNITY)
Admission: RE | Admit: 2019-01-25 | Discharge: 2019-01-25 | Disposition: A | Discharge status: Home / Self Care | Payer: BC Managed Care – PPO | Source: Ambulatory Visit | Attending: Family Medicine | Admitting: Family Medicine

## 2019-01-25 ENCOUNTER — Encounter: Payer: Self-pay | Admitting: Family Medicine

## 2019-01-25 ENCOUNTER — Ambulatory Visit: Payer: BC Managed Care – PPO | Admitting: Family Medicine

## 2019-01-25 VITALS — BP 126/70 | HR 70 | Temp 98.3°F | Resp 16 | Ht 62.0 in | Wt 285.0 lb

## 2019-01-25 VITALS — BP 131/76 | HR 63 | Temp 97.1°F | Ht 62.0 in | Wt 284.0 lb

## 2019-01-25 DIAGNOSIS — D4989 Neoplasm of unspecified behavior of other specified sites: Secondary | ICD-10-CM | POA: Insufficient documentation

## 2019-01-25 DIAGNOSIS — R222 Localized swelling, mass and lump, trunk: Secondary | ICD-10-CM | POA: Diagnosis not present

## 2019-01-25 DIAGNOSIS — I10 Essential (primary) hypertension: Secondary | ICD-10-CM

## 2019-01-25 DIAGNOSIS — I480 Paroxysmal atrial fibrillation: Secondary | ICD-10-CM | POA: Diagnosis not present

## 2019-01-25 DIAGNOSIS — R221 Localized swelling, mass and lump, neck: Secondary | ICD-10-CM | POA: Diagnosis not present

## 2019-01-25 MED ORDER — IOHEXOL 300 MG/ML  SOLN
75.0000 mL | Freq: Once | INTRAMUSCULAR | Status: AC | PRN
  Administered 2019-01-25: 18:00:00 75 mL via INTRAVENOUS

## 2019-01-25 NOTE — Addendum Note (Signed)
Addended by: Vic Blackbird F on: 01/25/2019 02:03 PM   Modules accepted: Orders

## 2019-01-25 NOTE — Progress Notes (Signed)
   Subjective:    Patient ID: Monique Gomez, female    DOB: 01/14/1956, 63 y.o.   MRN: [id number]  Patient presents for Lump (new lump to L side of clavicle- hard area with no redness or drainage)   Pt here with lump above clavicle for the past 2 weeks, initially thought it was a bite, she has soerness at the area and when she turns her neck has some discomfort in the area..  She had had a few bug bites on her arm but none above clavicle, bites went a way on there own but she still noticed the mass, no redness, no drainage, no injury that she is aware of    Review Of Systems:  GEN- denies fatigue, fever, weight loss,weakness, recent illness HEENT- denies eye drainage, change in vision, nasal discharge, CVS- denies chest pain, palpitations RESP- denies SOB, cough, wheeze ABD- denies N/V, change in stools, abd pain GU- denies dysuria, hematuria, dribbling, incontinence MSK- denies joint pain, muscle aches, injury Neuro- denies headache, dizziness, syncope, seizure activity       Objective:    BP 126/70   Pulse 70   Temp 98.3 F (36.8 C) (Oral)   Resp 16   Ht 5\' 2"  (1.575 m)   Wt 285 lb (129.3 kg)   SpO2 97%   BMI 52.13 kg/m  GEN- NAD, alert and oriented x3 HEENT- PERRL, EOMI, non injected sclera, pink conjunctiva, MMM, oropharynx clear, TM clear, nares clear  Nodes- no axillary nodes, no submandibular nodes palpated  Neck- Supple, no thyromegaly, 3 inch long supraclavicular subcutaneous mass palpated- more linear than round, mild TTP, FROM neck   CVS- RRR, no murmur RESP-CTAB        Assessment & Plan:      Problem List Items Addressed This Visit    None    Visit Diagnoses    Subcutaneous mass of supraclavicular area    -  Primary   DD Cyst vs, abnormal lymph nodes, no current infection noted, no cellulitis of skin, obtain ultrasound to further evaluate   Relevant Orders   US Soft Tissue Head/Neck      Note: This dictation was prepared with Dragon dictation  along with smaller phrase technology. Any transcriptional errors that result from this process are unintentional.

## 2019-01-25 NOTE — Patient Instructions (Signed)
Medication Instructions: Your physician recommends that you continue on your current medications as directed. Please refer to the Current Medication list given to you today.   Labwork: None today  Procedures/Testing: None today  Follow-Up: 6 months in office with Dr.McDowell  Any Additional Special Instructions Will Be Listed Below (If Applicable).     If you need a refill on your cardiac medications before your next appointment, please call your pharmacy.     Thank you for choosing [facility] !        

## 2019-01-28 ENCOUNTER — Other Ambulatory Visit: Payer: Self-pay

## 2019-01-28 MED ORDER — AMOXICILLIN-POT CLAVULANATE 875-125 MG PO TABS: 1.0000 | ORAL_TABLET | Freq: Two times a day (BID) | ORAL | 0 refills | Status: DC

## 2019-02-05 DIAGNOSIS — H3550 Unspecified hereditary retinal dystrophy: Secondary | ICD-10-CM | POA: Diagnosis not present

## 2019-02-05 DIAGNOSIS — E119 Type 2 diabetes mellitus without complications: Secondary | ICD-10-CM | POA: Diagnosis not present

## 2019-02-05 DIAGNOSIS — H25811 Combined forms of age-related cataract, right eye: Secondary | ICD-10-CM | POA: Diagnosis not present

## 2019-02-05 DIAGNOSIS — H5213 Myopia, bilateral: Secondary | ICD-10-CM | POA: Diagnosis not present

## 2019-02-05 LAB — HM DIABETES EYE EXAM

## 2019-02-07 ENCOUNTER — Telehealth: Payer: Self-pay | Admitting: *Deleted

## 2019-02-07 NOTE — Telephone Encounter (Signed)
noted 

## 2019-02-07 NOTE — Telephone Encounter (Signed)
Received call from patient.   Reports that she has completed ABTx for enlarged  lymph nodes. Patient states that nodes are still present and have not improved any.   Per notes on CT scan, if no better after ABTx, re-check in office. Appointment scheduled.  MD to be made aware.

## 2019-02-12 ENCOUNTER — Other Ambulatory Visit: Payer: Self-pay

## 2019-02-12 ENCOUNTER — Encounter (HOSPITAL_COMMUNITY): Payer: Self-pay | Admitting: *Deleted

## 2019-02-12 ENCOUNTER — Encounter: Payer: Self-pay | Admitting: Family Medicine

## 2019-02-12 ENCOUNTER — Ambulatory Visit: Payer: BC Managed Care – PPO | Admitting: Family Medicine

## 2019-02-12 ENCOUNTER — Emergency Department (HOSPITAL_COMMUNITY): Payer: BC Managed Care – PPO

## 2019-02-12 ENCOUNTER — Emergency Department (HOSPITAL_COMMUNITY)
Admission: EM | Admit: 2019-02-12 | Discharge: 2019-02-12 | Disposition: A | Discharge status: Home / Self Care | Payer: BC Managed Care – PPO | Attending: Emergency Medicine | Admitting: Emergency Medicine

## 2019-02-12 ENCOUNTER — Telehealth: Payer: Self-pay | Admitting: Cardiology

## 2019-02-12 VITALS — BP 118/80 | HR 76 | Temp 98.5°F | Ht 62.0 in | Wt 287.0 lb

## 2019-02-12 DIAGNOSIS — Z7901 Long term (current) use of anticoagulants: Secondary | ICD-10-CM | POA: Diagnosis not present

## 2019-02-12 DIAGNOSIS — R590 Localized enlarged lymph nodes: Secondary | ICD-10-CM

## 2019-02-12 DIAGNOSIS — E119 Type 2 diabetes mellitus without complications: Secondary | ICD-10-CM | POA: Diagnosis not present

## 2019-02-12 DIAGNOSIS — Z7984 Long term (current) use of oral hypoglycemic drugs: Secondary | ICD-10-CM | POA: Diagnosis not present

## 2019-02-12 DIAGNOSIS — Z87891 Personal history of nicotine dependence: Secondary | ICD-10-CM | POA: Insufficient documentation

## 2019-02-12 DIAGNOSIS — Z79899 Other long term (current) drug therapy: Secondary | ICD-10-CM | POA: Insufficient documentation

## 2019-02-12 DIAGNOSIS — I480 Paroxysmal atrial fibrillation: Secondary | ICD-10-CM | POA: Insufficient documentation

## 2019-02-12 DIAGNOSIS — I10 Essential (primary) hypertension: Secondary | ICD-10-CM | POA: Insufficient documentation

## 2019-02-12 DIAGNOSIS — R0600 Dyspnea, unspecified: Secondary | ICD-10-CM

## 2019-02-12 DIAGNOSIS — R0602 Shortness of breath: Secondary | ICD-10-CM | POA: Diagnosis not present

## 2019-02-12 LAB — COMPREHENSIVE METABOLIC PANEL
ALT: 13 U/L (ref 0–44)
AST: 13 U/L — ABNORMAL LOW (ref 15–41)
Albumin: 3.9 g/dL (ref 3.5–5.0)
Alkaline Phosphatase: 77 U/L (ref 38–126)
Anion gap: 7 (ref 5–15)
BUN: 14 mg/dL (ref 8–23)
CO2: 23 mmol/L (ref 22–32)
Calcium: 8.9 mg/dL (ref 8.9–10.3)
Chloride: 108 mmol/L (ref 98–111)
Creatinine, Ser: 0.82 mg/dL (ref 0.44–1.00)
GFR calc Af Amer: >60 mL/min (ref >60)
GFR calc non Af Amer: >60 mL/min (ref >60)
Glucose, Bld: 106 mg/dL — ABNORMAL HIGH (ref 70–99)
Potassium: 3.9 mmol/L (ref 3.5–5.1)
Sodium: 138 mmol/L (ref 135–145)
Total Bilirubin: 0.3 mg/dL (ref 0.3–1.2)
Total Protein: 7.8 g/dL (ref 6.5–8.1)

## 2019-02-12 LAB — TROPONIN I (HIGH SENSITIVITY)
Troponin I (High Sensitivity): <2 ng/L (ref <18)
Troponin I (High Sensitivity): <2 ng/L (ref <18)

## 2019-02-12 LAB — CBC
HCT: 38.8 % (ref 36.0–46.0)
Hemoglobin: 11.5 g/dL — ABNORMAL LOW (ref 12.0–15.0)
MCH: 21.8 pg — ABNORMAL LOW (ref 26.0–34.0)
MCHC: 29.6 g/dL — ABNORMAL LOW (ref 30.0–36.0)
MCV: 73.5 fL — ABNORMAL LOW (ref 80.0–100.0)
Platelets: 286 10*3/uL (ref 150–400)
RBC: 5.28 MIL/uL — ABNORMAL HIGH (ref 3.87–5.11)
RDW: 17.2 % — ABNORMAL HIGH (ref 11.5–15.5)
WBC: 8.5 10*3/uL (ref 4.0–10.5)
nRBC: 0 % (ref 0.0–0.2)

## 2019-02-12 NOTE — ED Provider Notes (Signed)
Patient's care continued at shift change.  Patient is a 63 year old female who presented to the emergency department with shortness of breath.  The patient has a history of paroxysmal atrial fibrillation, and is anticoagulated on Eliquis.  Patient has a history of type 2 diabetes, obesity, and coronary artery disease.  The patient complained of some intermittent shortness of breath that was aggravated with activity.  Patient saw her primary care physician had some adjustments in her medication.  Patient presents to the emergency department for additional evaluation.  Work-up is in progress.   Initial troponin was less than 2.  Delta troponin pending.  Patient ambulated in the room, as well as in the hall to the bathroom without problem.  Repeat high-sensitive troponin less than 2.  I reviewed the findings with the patient in terms of which he understands.  I have asked the patient to notify her cardiologist in the morning of her symptoms and and to her symptoms make a follow-up appointment as soon as possible.  Patient is in agreement with this plan.   Lily Kocher, PA-C 02/12/19 2306    Nat Christen, MD 02/13/19 [phone]

## 2019-02-12 NOTE — ED Triage Notes (Signed)
Patient presents to the ED with sob, tingling of left arm, left breast since starting new medication, (topamax, amovig).  Patient has seen her PCP today who has changed her topamax dosage. Patient felt her sob and increased as the day progressed and felt she needed to be seen.

## 2019-02-12 NOTE — Progress Notes (Signed)
Subjective:    Patient ID: Monique Gomez, female    DOB: [birth year], 63 y.o.   MRN: [id number]  Patient presents for Lymphadenopathy (Has c/o of feeling of something in throat) and Shortness of Breath  Pt here to f/u LAD, had CT scan showed a few different nodes in a row, though to be a reactive process, less likley lymphoma, WBC were unremarkable   She did complete th entibiotics but still has the lymph nodes and pain in that region She has soreness whe she turns her neck, sometimes feels like something is caught in her throat as well She still has post nasal drip, did not use flonase, because it said not to use if you have cataracts . she has an upcoming cataract surgery  He continues to have episodes of shortness of breath which are worsened over the past 2 weeks.  She is already seen by her cardiologist felt that her A. fib was under good control did not make any changes.  She is concerned is due to her atenolol or the Topamax as both of them she can tell with her breathing causes issues.  She has not had any cough or wheezing.        Review Of Systems:  GEN- denies fatigue, fever, weight loss,weakness, recent illness HEENT- denies eye drainage, change in vision, nasal discharge, CVS- denies chest pain, palpitations RESP- +SOB, cough, wheeze ABD- denies N/V, change in stools, abd pain GU- denies dysuria, hematuria, dribbling, incontinence MSK- denies joint pain, muscle aches, injury Neuro- denies headache, dizziness, syncope, seizure activity       Objective:    BP 118/80   Pulse 76   Temp 98.5 F (36.9 C) (Oral)   Ht 5\' 2"  (1.575 m)   Wt 287 lb (130.2 kg)   SpO2 99%   BMI 52.49 kg/m  GEN- NAD, alert and oriented x3,obese  HEENT- PERRL, EOMI, non injected sclera, pink conjunctiva, MMM, oropharynx clear, nares clear rhinorrhea, no sinus tenderness Nodes- no axillary nodes, no submandibular nodes palpated  Neck- Supple, no thyromegaly, 3 inch long supraclavicular  subcutaneous mass palpated- more linear than round, mild TTP, FROM neck, pain with rotation of neck bilat  CVS- RRR, no murmur RESP-CTAB EXT- No edema Pulses- Radial 2+        Assessment & Plan:      Problem List Items Addressed This Visit    None    Visit Diagnoses    Supraclavicular lymphadenopathy    -  Primary   Recheck CT scan, present > 1 month now, did not respond to antibiotics, affect neck ROM   Relevant Orders   CT Soft Tissue Neck W Contrast   SOB (shortness of breath)       Unclear cause may be MTF, with obesity, A fib, meds. She is convinced her meds may be causing problem, will have her decrease topamax 50 mg at bedtime see if this makes a difference.  Prefer to hold on changing her atenolol as we have recently gotten her blood pressure and rate under control.  Other the next step would be if the chest x-ray is negative no change of Topamax she could try coming off of this and going into calcium channel blocker to help control her rate.  I would like for her cardiologist to weigh on in this change though.   Relevant Orders   DG Chest 2 View      Note: This dictation was prepared with Dragon dictation along with  smaller phrase technology. Any transcriptional errors that result from this process are unintentional.

## 2019-02-12 NOTE — Telephone Encounter (Signed)
Returned pt call. NA, LMTCB  

## 2019-02-12 NOTE — Patient Instructions (Addendum)
Chest xray to be done CT of neck to be done to recheck the lymph nodes Try claritin for the drainage Decrease the topamax to 50mg  at bedtime  Mammogram to be scheduled  F/U pending results

## 2019-02-12 NOTE — ED Provider Notes (Signed)
[facility] EMERGENCY DEPARTMENT Provider Note   CSN: [id number] Arrival date & time: 02/12/19  1726     History   Chief Complaint Chief Complaint  Patient presents with  . Shortness of Breath    HPI Monique Gomez is a 63 y.o. female w/ a hx of PAF anticoagulated on Eliquis, anemia, HTN, GERD, hyperlipidemia, T2DM, obesity, & CAD who presents to the ED with complaints of intermittent shortness of breath for the past few months that has worsened over the past few days.  Patient states that she feels intermittently short of breath initially seemed more with activity now can occur without triggers.  States at times when she feels short of breath that her left arm feels a bit tingly and that her left chest feels achy under the breast area.  She has the shortness of breath is worsening with the chest discomfort/arm tingling has not had much of a change and is not occurring @ present. No significant alleviating or aggravating factors at this point.  She saw her PCP for the same this morning and had her Topamax decreased as the patient thought this may be contributing to her symptoms.  Patient denies nausea, vomiting, diaphoresis, fever, chills, cough, hemoptysis, unilateral leg pain/swelling, recent surgery/trauma, recent long travel, hormone use, personal hx of cancer, or hx of DVT/PE.  She has been compliant with her Eliquis.  She has had some increased stress and has been very busy at work.    HPI  Past Medical History:  Diagnosis Date  . Allergic rhinitis   . Anemia   . Essential hypertension   . GERD (gastroesophageal reflux disease)   . Headache(784.0)   . History of cardiac catheterization    No significant CAD May 2015  . History of migraine headaches   . Hyperlipidemia   . Obesity   . PAF (paroxysmal atrial fibrillation) ([place]) 08/2013  . Type 2 diabetes mellitus [facility])     Patient Active Problem List   Diagnosis Date Noted  . New onset headache 10/30/2018  . Empty sella  syndrome ([place]) 10/24/2018  . Screening for colon cancer 01/07/2015  . Hemorrhoids 11/25/2014  . Obstructive sleep apnea 10/14/2013  . Atrial fibrillation ([place]) 09/06/2013  . Lumbar back pain 09/06/2013  . NSTEMI (non-ST elevated myocardial infarction) ([place]) 09/03/2013  . MDD (major depressive disorder), single episode 02/04/2013  . Anxiety state, unspecified 02/04/2013  . Diabetes mellitus ([place]) 06/06/2012  . Carpal tunnel syndrome of left wrist 12/27/2011  . De Quervain's disease (tenosynovitis) 10/09/2011  . Heartburn 02/27/2011  . Allergic rhinitis 08/29/2010  . Anemia 02/15/2010  . Hyperlipidemia 12/23/2009  . Morbid obesity ([place]) 12/23/2009  . Chronic migraine 12/23/2009  . Essential hypertension, benign 12/23/2009    Past Surgical History:  Procedure Laterality Date  . CATARACT EXTRACTION    . COLONOSCOPY N/A 02/02/2015   SLF: 1. one colon polyp removed-no source for anemia identified. 2. moderate diverticulosis noted in the sigmoid colon and descending colon 3. the left colon is redundant 4. Rectal bleeding due ot small internal hemorroids 5. Moderate sized external hemorrhoids.  . ESOPHAGOGASTRODUODENOSCOPY N/A 02/02/2015   SLF: 1. 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 large hiatal hernia 3. mild non-erosive gastritis 4. No source for anemia identified.   Marland Kitchen LEFT HEART CATHETERIZATION WITH CORONARY ANGIOGRAM N/A 09/03/2013   Procedure: LEFT HEART CATHETERIZATION WITH CORONARY ANGIOGRAM;  Surgeon: Jettie Booze, MD;  Location: [facility] CATH LAB;  Service: Cardiovascular;  Laterality: N/A;  . Right carpal  tunnel release       OB History   No obstetric history on file.      Home Medications    Prior to Admission medications   Medication Sig Start Date End Date Taking? Authorizing Provider  apixaban (ELIQUIS) 5 MG TABS tablet Take 1 tablet (5 mg total) by mouth 2 (two) times daily. 01/17/19   Alycia Rossetti, MD  atenolol (TENORMIN) 25 MG tablet Take 1  tablet (25 mg total) by mouth 2 (two) times daily. 12/10/18 03/10/19  [place], Fransisco Hertz, PA-C  atorvastatin (LIPITOR) 80 MG tablet Take 1 tablet (80 mg total) by mouth daily. 01/17/19   [place], Modena Nunnery, MD  Erenumab-aooe (AIMOVIG) 140 MG/ML SOAJ Inject 140 mg into the skin every 30 (thirty) days. 11/22/18   Marcial Pacas, MD  Iron-FA-B Cmp-C-Biot-Probiotic (FUSION PLUS) CAPS Take 2 tablets by mouth daily. 10/11/18   [place], Modena Nunnery, MD  lisinopril (ZESTRIL) 20 MG tablet Take 1 tablet (20 mg total) by mouth daily. 10/12/18   Alycia Rossetti, MD  metFORMIN (GLUCOPHAGE) 500 MG tablet Take 1 tablet (500 mg total) by mouth 2 (two) times daily with a meal. 10/19/18   [place], Modena Nunnery, MD  metoCLOPramide (REGLAN) 10 MG tablet Take 1 tablet (10 mg total) by mouth every 8 (eight) hours as needed for up to 7 days for nausea (Headache). 10/13/18   Noemi Chapel, MD  ondansetron (ZOFRAN ODT) 4 MG disintegrating tablet Take 1 tablet (4 mg total) by mouth every 8 (eight) hours as needed. 10/30/18   Marcial Pacas, MD  pantoprazole (PROTONIX) 40 MG tablet Take 1 tablet by mouth twice daily 01/08/19   [place], Modena Nunnery, MD  SUMAtriptan (IMITREX) 6 MG/0.5ML SOLN injection Inject 0.5 mLs (6 mg total) into the skin every 2 (two) hours as needed for migraine or headache. May repeat in 2 hours if headache persists or recurs. 12/03/18   Marcial Pacas, MD  tiZANidine (ZANAFLEX) 4 MG tablet Take 1 tablet (4 mg total) by mouth every 6 (six) hours as needed for muscle spasms. Patient not taking: Reported on 02/12/2019 11/05/18   Marcial Pacas, MD  topiramate (TOPAMAX) 50 MG tablet Take 2 tablets (100 mg total) by mouth at bedtime. Take 1-2 tablets at bedtime for migraines 11/22/18   Marcial Pacas, MD    Family History Family History  Problem Relation Age of Onset  . Diabetes Mother   . Hypertension Mother   . Heart disease Mother   . Hypertension Brother   . Colon cancer Neg Hx     Social History Social History   Tobacco Use  . Smoking  status: Former Smoker    Packs/day: 0.30    Types: Cigarettes    Quit date: 04/01/2012    Years since quitting: 6.8  . Smokeless tobacco: Never Used  Substance Use Topics  . Alcohol use: Not Currently    Alcohol/week: 0.0 standard drinks    Comment: Wine typically 2-3 glasses every few weekends  . Drug use: No     Allergies   Venlafaxine   Review of Systems Review of Systems  Constitutional: Negative for chills, diaphoresis and fever.  Respiratory: Positive for shortness of breath.   Cardiovascular: Positive for chest pain (Not at present.). Negative for palpitations and leg swelling.  Gastrointestinal: Negative for abdominal pain, constipation, nausea and vomiting.  Musculoskeletal: Negative for myalgias.  Neurological: Negative for dizziness, syncope, weakness and light-headedness.       Intermittent left upper extremity paresthesias which are  not present currently.  All other systems reviewed and are negative.    Physical Exam Updated Vital Signs BP 126/87   Pulse (!) 55   Temp 97.9 F (36.6 C)   Resp 10   Ht 5\' 2"  (1.575 m)   Wt 130 kg   SpO2 100%   BMI 52.42 kg/m   Physical Exam Vitals signs and nursing note reviewed.  Constitutional:      General: She is not in acute distress.    Appearance: She is well-developed. She is not toxic-appearing.  HENT:     Head: Normocephalic and atraumatic.  Eyes:     General:        Right eye: No discharge.        Left eye: No discharge.     Conjunctiva/sclera: Conjunctivae normal.  Neck:     Musculoskeletal: Neck supple.  Cardiovascular:     Rate and Rhythm: Normal rate and regular rhythm.     Comments: 2+ symmetric radial pulses. Pulmonary:     Effort: Pulmonary effort is normal. No respiratory distress.     Breath sounds: Normal breath sounds. No wheezing, rhonchi or rales.  Abdominal:     General: There is no distension.     Palpations: Abdomen is soft.     Tenderness: There is no abdominal tenderness.   Musculoskeletal:     Right lower leg: She exhibits no tenderness. No edema.     Left lower leg: She exhibits no tenderness. No edema.  Skin:    General: Skin is warm and dry.     Capillary Refill: Capillary refill takes less than 2 seconds.     Findings: No rash.  Neurological:     General: No focal deficit present.     Mental Status: She is alert.     Comments: Clear speech. Sensation grossly intact to BUE. 5/5 symmetric grip strength.   Psychiatric:        Mood and Affect: Mood is anxious (mild).        Behavior: Behavior normal.    ED Treatments / Results  Labs (all labs ordered are listed, but only abnormal results are displayed) Labs Reviewed  CBC - Abnormal; Notable for the following components:      Result Value   RBC 5.28 (*)    Hemoglobin 11.5 (*)    MCV 73.5 (*)    MCH 21.8 (*)    MCHC 29.6 (*)    RDW 17.2 (*)    All other components within normal limits  COMPREHENSIVE METABOLIC PANEL - Abnormal; Notable for the following components:   Glucose, Bld 106 (*)    AST 13 (*)    All other components within normal limits  TROPONIN I (HIGH SENSITIVITY)  TROPONIN I (HIGH SENSITIVITY)    EKG EKG Interpretation  Date/Time:  Tuesday February 12 2019 18:35:05 EDT Ventricular Rate:  72 PR Interval:    QRS Duration: 97 QT Interval:  392 QTC Calculation: 429 R Axis:   38 Text Interpretation:  Sinus rhythm Abnormal R-wave progression, early transition Abnormal T, consider ischemia, diffuse leads Confirmed by Nat Christen [phone]) on 02/12/2019 7:50:47 PM   Radiology Dg Chest 2 View  Result Date: 02/12/2019 CLINICAL DATA:  Patient presents to the ED with sob, tingling of left arm, left breast since starting new medication, (topamax, amovig). EXAM: CHEST - 2 VIEW COMPARISON:  Chest radiograph 10/09/2017 FINDINGS: Stable cardiomediastinal contours with enlarged heart size. A small linear opacity at the left base likely reflects atelectasis.  The lungs are otherwise clear. No  pneumothorax or pleural effusion. No acute finding in the visualized skeleton. IMPRESSION: Cardiomegaly. No acute cardiopulmonary finding. Small linear opacity at the left base likely reflects atelectasis. Electronically Signed   By: Audie Pinto M.D.   On: 02/12/2019 19:53    Procedures Procedures (including critical care time)  Medications Ordered in ED Medications - No data to display   Initial Impression / Assessment and Plan / ED Course  I have reviewed the triage vital signs and the nursing notes.  Pertinent labs & imaging results that were available during my care of the patient were reviewed by me and considered in my medical decision making (see chart for details).    Patient presents to the emergency department with complaints of dyspnea for past several months worse over the past few days. Also mentions some vague intermittent LUE paresthesias & aching chest discomfort under left breast for several months that has not acutely changed and is not currently present.  Patient nontoxic appearing, in no apparent distress, vitals without significant abnormality. Fairly benign physical exam. Heart RRR. Lungs CTA. Evaluation initiated with labs, EKG, and CXR. Patient on cardiac monitor.   Work-up in the ER reviewed:  CBC: Anemia relatively similar to prior. No leukocytosis.  CMP: No significant electrolyte derangement. Renal function preserved.  Troponin: < 2 EKG: No STEMI, personally reviewed and does not appear to have significant change compared to prior.  CXR:  No acute abnormality, cardiomegaly and likely atelectasis. No infiltrate, pulmonary edema, pneumothorax, or fracture/dislocation.   Patient's primary complaint is dyspnea, intermittent vague chest discomfort for several months EKG without STEMI or significant change from prior, Initial troponin < 2, repeat troponin pending. Low risk wells, on anticoagulation and reports compliance- doubt PE. No tearing chest pain, symmetric  pulses, no widened mediastinum on CXR- doubt dissection. Cardiomegaly on imaging but this was present on prior CXRs on chart review, no pulmonary edema, no peripheral edema- does not seem like acute new onset CHF exacerbation at this time. Cardiac monitor reviewed, no notable arrhythmias or tachycardia as underlying etiology. Lungs remain clear. Ambulatory without signs of respiratory distress. Feeling much better once informed of reassuring results thus far. Overall unclear cause of patient's sxs, she has appeared hemodynamically stable throughout ER visit.   22:00: Patient care signed out to Lily Kocher PA-C at change of shift pending repeat troponin- if no significant elevation feel patient can be discharged home with close PCP/cardiology follow up.  I discussed results and plan of care with the patient thus far, provided opportunity for questions, patient confirmed understanding and is agreement.  Findings and plan of care discussed with supervising physician Dr. Lacinda Axon who is in agreement.    Final Clinical Impressions(s) / ED Diagnoses   Final diagnoses:  Dyspnea, unspecified type    ED Discharge Orders    None       Leafy Kindle 02/12/19 2209    Nat Christen, MD 02/13/19 1529

## 2019-02-12 NOTE — Telephone Encounter (Signed)
Pt has a question concerning medications   Please call [phone]

## 2019-02-12 NOTE — ED Notes (Signed)
Lab in room.

## 2019-02-12 NOTE — Discharge Instructions (Addendum)
You were seen in the emergency department today for shortness of breath. Your work-up in the emergency department has been overall reassuring. Your labs have been fairly normal and or similar to previous blood work you have had done. Your EKG and the enzyme we use to check your heart did not show an acute heart attack at this time. Your chest x-ray did not show any acute abnormalities- it showed that your heart is mildly enlarged which it has been on prior xrays- this should be rechecked by primary care or your cardiologist.  We would like you to follow up closely with your primary care provider and/or the cardiologist provided in your discharge instructions within 1-3 days. Return to the ER immediately should you experience any new or worsening symptoms including but not limited to return of pain, worsened pain, vomiting, shortness of breath, dizziness, lightheadedness, passing out, or any other concerns that you may have.

## 2019-02-13 DIAGNOSIS — G4733 Obstructive sleep apnea (adult) (pediatric): Secondary | ICD-10-CM | POA: Diagnosis not present

## 2019-02-15 ENCOUNTER — Other Ambulatory Visit (HOSPITAL_COMMUNITY): Payer: Self-pay | Admitting: Family Medicine

## 2019-02-15 DIAGNOSIS — Z1231 Encounter for screening mammogram for malignant neoplasm of breast: Secondary | ICD-10-CM

## 2019-02-18 NOTE — Telephone Encounter (Signed)
Returned pt call. She was seen in ED after visit with Dr. Domenic Polite. She was advised to follow up per her SOB.

## 2019-02-22 ENCOUNTER — Other Ambulatory Visit: Payer: Self-pay

## 2019-02-22 ENCOUNTER — Ambulatory Visit (HOSPITAL_COMMUNITY)
Admission: RE | Admit: 2019-02-22 | Discharge: 2019-02-22 | Disposition: A | Discharge status: Home / Self Care | Payer: BC Managed Care – PPO | Source: Ambulatory Visit | Attending: Family Medicine | Admitting: Family Medicine

## 2019-02-22 DIAGNOSIS — L040 Acute lymphadenitis of face, head and neck: Secondary | ICD-10-CM | POA: Diagnosis not present

## 2019-02-22 DIAGNOSIS — R590 Localized enlarged lymph nodes: Secondary | ICD-10-CM | POA: Diagnosis not present

## 2019-02-22 MED ORDER — IOHEXOL 300 MG/ML  SOLN
75.0000 mL | Freq: Once | INTRAMUSCULAR | Status: AC | PRN
  Administered 2019-02-22: 14:00:00 75 mL via INTRAVENOUS

## 2019-02-24 NOTE — Progress Notes (Signed)
PATIENT: Monique Gomez DOB: Jun 21, 1955  REASON FOR VISIT: follow up HISTORY FROM: patient  HISTORY OF PRESENT ILLNESS: Today 02/25/19  HISTORY   Monique Gomez is a 63 year old female, seen in request by her primary care physician Dr. Buelah Manis, Lonell Grandchild F for evaluation of frequent headaches, initial evaluation through virtual visit on October 30, 2018.  I have reviewed and summarized the referring note from the referring physician.  She had past medical history of hypertension, hyperlipidemia, diabetes since 2018, atrial fibrillation, taking Eliquis, denies history of stroke or coronary artery disease  She reported history of migraine headache when she was 20s and 30s, she had severe frequent headaches at that time, but much improved, over the past couple months, she complains of gradual worsening headaches, especially since June 2020, she had a persistent headache for 3 weeks, she complains of daily retro-orbital area, occipital area pressure headaches, movement made it worse, with associated light noise sound sensitivity,  She does complains of anxiety especially with COVID 19, she works as a Education officer, museum  She was started on Topamax 25 mg 2 tablets every night as preventive medication, which helps her, was given prescription of Imitrex 100 mg as needed, she tried few times, did not help her headache much, complains of worsening nausea  Previously tried Fioricet with limited help  She was seen by her primary care physician, had MRI and CT head, personally reviewed MRI of the brain on October 23, 2018, no acute abnormality, mild small vessel disease, partially empty sella CT of maxillary sinus, there was no evidence of acute abnormality  Laboratory evaluations in June 2020: BMP showed mild elevated glucose 121, CBC showed hemoglobin of 12.7, WBC of 11.4, normal TSH, A1c of 6.6,  UPDATE November 22 2018: She was started on Effexor, could not tolerated due to side effect, was  later switched to Topamax, 25 mg titrating to 2 tablets every night, also add on Aimovig 70 mg every month, she reported moderate improvement of her headaches, Maxalt along with Zofran works for her headaches, but caused drowsiness, she has to use Imitrex injection 2-3 times each week for rescue therapy, which works for her migraine, she office for IV infusion for abortive treatment, which works well for her,  Update February 25, 2019 SS: She presents today for follow-up for migraine headaches.  She is taking Aimovig 70 mg monthly injection, Topamax 50 mg at bedtime.  She indicates her migraines are under excellent control.  She has not had a migraine in 3 months.  She recently decreased her dose of Topamax due to shortness of breath.  She is tolerating the lower dose of Topamax.  She does have chronic sinus issues, well managed with Mucinex, Tylenol, Flonase.  She has not had to use her rescue medications in 3 months.  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 She has AFIB.   REVIEW OF SYSTEMS: Out of a complete 14 system review of symptoms, the patient complains only of the following symptoms, and all other reviewed systems are negative.  Apnea  ALLERGIES: Allergies  Allergen Reactions  . Venlafaxine Nausea And Vomiting and Other (See Comments)    Dizziness, shakiness    HOME MEDICATIONS: Outpatient Medications Prior to Visit  Medication Sig Dispense Refill  . AIMOVIG 70 MG/ML SOAJ Inject 70 mg into the skin every 30 (thirty) days.     Marland Kitchen apixaban (ELIQUIS) 5 MG TABS tablet Take 1 tablet (5 mg total) by mouth 2 (two)  times daily. 180 tablet 3  . atenolol (TENORMIN) 25 MG tablet Take 1 tablet (25 mg total) by mouth 2 (two) times daily. 180 tablet 3  . atorvastatin (LIPITOR) 80 MG tablet Take 1 tablet (80 mg total) by mouth daily. 90 tablet 3  . Iron-FA-B Cmp-C-Biot-Probiotic (FUSION PLUS) CAPS Take 2 tablets by mouth daily. 60 capsule 3  . lisinopril (ZESTRIL) 20 MG tablet  Take 1 tablet (20 mg total) by mouth daily. 90 tablet 1  . metFORMIN (GLUCOPHAGE) 500 MG tablet Take 1 tablet (500 mg total) by mouth 2 (two) times daily with a meal. 180 tablet 3  . metoCLOPramide (REGLAN) 10 MG tablet Take 1 tablet (10 mg total) by mouth every 8 (eight) hours as needed for up to 7 days for nausea (Headache). 21 tablet 0  . ondansetron (ZOFRAN ODT) 4 MG disintegrating tablet Take 1 tablet (4 mg total) by mouth every 8 (eight) hours as needed. (Patient taking differently: Take 4 mg by mouth every 8 (eight) hours as needed for nausea or vomiting. ) 20 tablet 6  . pantoprazole (PROTONIX) 40 MG tablet Take 1 tablet by mouth twice daily (Patient taking differently: Take 40 mg by mouth 2 (two) times daily. ) 180 tablet 0  . SUMAtriptan (IMITREX) 6 MG/0.5ML SOLN injection Inject 0.5 mLs (6 mg total) into the skin every 2 (two) hours as needed for migraine or headache. May repeat in 2 hours if headache persists or recurs. 6 mL 6  . tiZANidine (ZANAFLEX) 4 MG tablet Take 1 tablet (4 mg total) by mouth every 6 (six) hours as needed for muscle spasms. 30 tablet 0  . topiramate (TOPAMAX) 50 MG tablet Take 2 tablets (100 mg total) by mouth at bedtime. Take 1-2 tablets at bedtime for migraines (Patient taking differently: Take 50 mg by mouth at bedtime. ) 60 tablet 11  . Erenumab-aooe (AIMOVIG) 140 MG/ML SOAJ Inject 140 mg into the skin every 30 (thirty) days. (Patient not taking: Reported on 02/12/2019) 1 pen 11   No facility-administered medications prior to visit.     PAST MEDICAL HISTORY: Past Medical History:  Diagnosis Date  . Allergic rhinitis   . Anemia   . Essential hypertension   . GERD (gastroesophageal reflux disease)   . Headache(784.0)   . History of cardiac catheterization    No significant CAD May 2015  . History of migraine headaches   . Hyperlipidemia   . Obesity   . PAF (paroxysmal atrial fibrillation) ([place]) 08/2013  . Type 2 diabetes mellitus ([place])     PAST  SURGICAL HISTORY: Past Surgical History:  Procedure Laterality Date  . CATARACT EXTRACTION    . COLONOSCOPY N/A 02/02/2015   SLF: 1. one colon polyp removed-no source for anemia identified. 2. moderate diverticulosis noted in the sigmoid colon and descending colon 3. the left colon is redundant 4. Rectal bleeding due ot small internal hemorroids 5. Moderate sized external hemorrhoids.  . ESOPHAGOGASTRODUODENOSCOPY N/A 02/02/2015   SLF: 1. 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 large hiatal hernia 3. mild non-erosive gastritis 4. No source for anemia identified.   Marland Kitchen LEFT HEART CATHETERIZATION WITH CORONARY ANGIOGRAM N/A 09/03/2013   Procedure: LEFT HEART CATHETERIZATION WITH CORONARY ANGIOGRAM;  Surgeon: Jettie Booze, MD;  Location: [facility] CATH LAB;  Service: Cardiovascular;  Laterality: N/A;  . Right carpal tunnel release      FAMILY HISTORY: Family History  Problem Relation Age of Onset  . Diabetes Mother   . Hypertension Mother   .  Heart disease Mother   . Hypertension Brother   . Colon cancer Neg Hx     SOCIAL HISTORY: Social History   Socioeconomic History  . Marital status: Single    Spouse name: Not on file  . Number of children: Not on file  . Years of education: Not on file  . Highest education level: Not on file  Occupational History  . Occupation: Training and development officer: [employer]  . Financial resource strain: Not on file  . Food insecurity    Worry: Not on file    Inability: Not on file  . Transportation needs    Medical: Not on file    Non-medical: Not on file  Tobacco Use  . Smoking status: Former Smoker    Packs/day: 0.30    Types: Cigarettes    Quit date: 04/01/2012    Years since quitting: 6.9  . Smokeless tobacco: Never Used  Substance and Sexual Activity  . Alcohol use: Not Currently    Alcohol/week: 0.0 standard drinks    Comment: Wine typically 2-3 glasses every few weekends  . Drug use: No  . Sexual  activity: Yes  Lifestyle  . Physical activity    Days per week: Not on file    Minutes per session: Not on file  . Stress: Not on file  Relationships  . Social Herbalist on phone: Not on file    Gets together: Not on file    Attends religious service: Not on file    Active member of club or organization: Not on file    Attends meetings of clubs or organizations: Not on file    Relationship status: Not on file  . Intimate partner violence    Fear of current or ex partner: Not on file    Emotionally abused: Not on file    Physically abused: Not on file    Forced sexual activity: Not on file  Other Topics Concern  . Not on file  Social History Narrative  . Not on file    PHYSICAL EXAM  Vitals:   02/25/19 0902  BP: (!) 159/93  Pulse: (!) 51  Temp: (!) 96.9 F (36.1 C)  TempSrc: Oral  Weight: 287 lb (130.2 kg)  Height: 5\' 2"  (1.575 m)   Body mass index is 52.49 kg/m.  Generalized: Well developed, in no acute distress   Neurological examination  Mentation: Alert oriented to time, place, history taking. Follows all commands speech and language fluent Cranial nerve II-XII: Pupils were equal round reactive to light. Extraocular movements were full, visual field were full on confrontational test. Facial sensation and strength were normal.  Head turning and shoulder shrug  were normal and symmetric. Motor: The motor testing reveals 5 over 5 strength of all 4 extremities. Good symmetric motor tone is noted throughout.  Sensory: Sensory testing is intact to soft touch on all 4 extremities. No evidence of extinction is noted.  Coordination: Cerebellar testing reveals good finger-nose-finger and heel-to-shin bilaterally.  Gait and station: Gait is normal. Tandem gait is normal. Reflexes: Deep tendon reflexes are symmetric and normal bilaterally.   DIAGNOSTIC DATA (LABS, IMAGING, TESTING) - I reviewed patient records, labs, notes, testing and imaging myself where  available.  Lab Results  Component Value Date   WBC 8.5 02/12/2019   HGB 11.5 (L) 02/12/2019   HCT 38.8 02/12/2019   MCV 73.5 (L) 02/12/2019   PLT 286 02/12/2019  Component Value Date/Time   NA 138 02/12/2019 1838   K 3.9 02/12/2019 1838   CL 108 02/12/2019 1838   CO2 23 02/12/2019 1838   GLUCOSE 106 (H) 02/12/2019 1838   BUN 14 02/12/2019 1838   CREATININE 0.82 02/12/2019 1838   CREATININE 1.01 (H) 01/14/2019 0837   CALCIUM 8.9 02/12/2019 1838   PROT 7.8 02/12/2019 1838   ALBUMIN 3.9 02/12/2019 1838   AST 13 (L) 02/12/2019 1838   ALT 13 02/12/2019 1838   ALKPHOS 77 02/12/2019 1838   BILITOT 0.3 02/12/2019 1838   GFRNONAA >60 02/12/2019 1838   GFRNONAA 83 10/23/2017 0811   GFRAA >60 02/12/2019 1838   GFRAA 97 10/23/2017 0811   Lab Results  Component Value Date   CHOL 185 01/14/2019   HDL 36 (L) 01/14/2019   LDLCALC 126 (H) 01/14/2019   TRIG 120 01/14/2019   CHOLHDL 5.1 (H) 01/14/2019   Lab Results  Component Value Date   HGBA1C 6.6 (H) 01/14/2019   Lab Results  Component Value Date   VITAMINB12 415 08/27/2010   Lab Results  Component Value Date   TSH 2.46 10/24/2018    ASSESSMENT AND PLAN 63 y.o. year old female  has a past medical history of Allergic rhinitis, Anemia, Essential hypertension, GERD (gastroesophageal reflux disease), Headache(784.0), History of cardiac catheterization, History of migraine headaches, Hyperlipidemia, Obesity, PAF (paroxysmal atrial fibrillation) ([place]) (08/2013), and Type 2 diabetes mellitus ([place]). here with:  1.  Chronic migraine headaches -Currently under excellent control, has not had a migraine in 3 months -Continue Aimovig 70 mg monthly injection -Continue Topamax 50 mg at bedtime -Continue Maxalt, Zofran, tizanidine, Imitrex subcutaneous injection as needed for severe prolonged headache, has not had to use rescue medications in 3 months -Follow-up in 6 months or sooner if needed  I spent 15 minutes with the patient.  50% of this time was spent discussing her plan of care.  Butler Denmark, AGNP-C, DNP 02/25/2019, 9:20 AM [facility] [address] [place], [place] [address] [phone]

## 2019-02-25 ENCOUNTER — Other Ambulatory Visit: Payer: Self-pay

## 2019-02-25 ENCOUNTER — Ambulatory Visit: Payer: BC Managed Care – PPO | Admitting: Neurology

## 2019-02-25 ENCOUNTER — Encounter: Payer: Self-pay | Admitting: Neurology

## 2019-02-25 VITALS — BP 159/93 | HR 51 | Temp 96.9°F | Ht 62.0 in | Wt 287.0 lb

## 2019-02-25 DIAGNOSIS — G43709 Chronic migraine without aura, not intractable, without status migrainosus: Secondary | ICD-10-CM

## 2019-02-25 DIAGNOSIS — IMO0002 Reserved for concepts with insufficient information to code with codable children: Secondary | ICD-10-CM

## 2019-02-25 MED ORDER — AIMOVIG 70 MG/ML ~~LOC~~ SOAJ: 70.0000 mg | SUBCUTANEOUS | 11 refills | Status: DC

## 2019-02-25 MED ORDER — TOPIRAMATE 50 MG PO TABS: ORAL_TABLET | ORAL | 3 refills | Status: DC

## 2019-02-25 NOTE — Patient Instructions (Signed)
1. Continue current medication, no changes 2. I am glad your headaches are doing so well :) 3. Return in 6 months

## 2019-02-25 NOTE — Progress Notes (Signed)
I have reviewed and agreed above plan. 

## 2019-02-26 ENCOUNTER — Telehealth: Payer: Self-pay

## 2019-02-26 NOTE — Telephone Encounter (Signed)
Pending renewal for Aimovig 70 mg Key: [id number] Rx #: [id number] ICD 10 code: [id number]    Your information has been submitted to [place]. [facility] will review the request and fax you a determination directly, typically within 3 business days of your submission once all necessary information is received.  If [employer] [facility] has not responded in 3 business days or if you have any questions about your submission, contact [facility] at [phone].

## 2019-02-28 NOTE — Telephone Encounter (Signed)
Aimovig has been approved through 02-25-2020. I will fax a copy of the approval letter to the patient's pharmacy. I will update the patient via mychart. Confirmation fax has been received.

## 2019-03-01 ENCOUNTER — Ambulatory Visit (HOSPITAL_COMMUNITY)
Admission: RE | Admit: 2019-03-01 | Discharge: 2019-03-01 | Disposition: A | Discharge status: Home / Self Care | Payer: BC Managed Care – PPO | Source: Ambulatory Visit | Attending: Family Medicine | Admitting: Family Medicine

## 2019-03-01 ENCOUNTER — Other Ambulatory Visit: Payer: Self-pay

## 2019-03-01 DIAGNOSIS — Z1231 Encounter for screening mammogram for malignant neoplasm of breast: Secondary | ICD-10-CM

## 2019-03-04 ENCOUNTER — Other Ambulatory Visit (HOSPITAL_COMMUNITY): Payer: Self-pay | Admitting: Family Medicine

## 2019-03-04 DIAGNOSIS — R928 Other abnormal and inconclusive findings on diagnostic imaging of breast: Secondary | ICD-10-CM

## 2019-03-05 ENCOUNTER — Other Ambulatory Visit: Payer: Self-pay

## 2019-03-05 ENCOUNTER — Ambulatory Visit (HOSPITAL_COMMUNITY)
Admission: RE | Admit: 2019-03-05 | Discharge: 2019-03-05 | Disposition: A | Discharge status: Home / Self Care | Payer: BC Managed Care – PPO | Source: Ambulatory Visit | Attending: Family Medicine | Admitting: Family Medicine

## 2019-03-05 DIAGNOSIS — R928 Other abnormal and inconclusive findings on diagnostic imaging of breast: Secondary | ICD-10-CM | POA: Diagnosis not present

## 2019-03-05 DIAGNOSIS — N6324 Unspecified lump in the left breast, lower inner quadrant: Secondary | ICD-10-CM | POA: Diagnosis not present

## 2019-03-06 NOTE — Progress Notes (Signed)
Cardiology Office Note    Date:  03/11/2019   ID:  Monique, Gomez Dec 06, 1955, MRN [id number]  PCP:  Alycia Rossetti, MD  Cardiologist: Rozann Lesches, MD EPS: None  No chief complaint on file.   History of Present Illness:  Monique Gomez is a 63 y.o. female with history of PAF on Eliquis,  hypertension, HLD obesity, DM type II, history of chest pain with no significant CAD on cath 08/2013  Last office visit with Dr. Domenic Polite 01/25/2019 she was doing well and stable.  Patient in the emergency room 02/12/2019 with shortness of breath and chest pain.  All labs stable.  Told to follow-up with [place]. Patient says since topomax decreased has helped. Still has shortness of breath that eases with leaning forward. Happens at random times. Was out of work as a for 3 months because of debilitating migraines but back now as a Education officer, museum, has enlarged lymph node, breast bleed from eliquis, muscles spasms in neck. Under a lot of stress.. Not exercising because of migraines. Chest pain feels like a soreness similar to after a day of exercise.  Is a Education officer, museum for [employer].  Under a lot of stress.    Past Medical History:  Diagnosis Date  . Allergic rhinitis   . Anemia   . Essential hypertension   . GERD (gastroesophageal reflux disease)   . Headache(784.0)   . History of cardiac catheterization    No significant CAD May 2015  . History of migraine headaches   . Hyperlipidemia   . Obesity   . PAF (paroxysmal atrial fibrillation) ([place]) 08/2013  . Type 2 diabetes mellitus ([place])     Past Surgical History:  Procedure Laterality Date  . CATARACT EXTRACTION    . COLONOSCOPY N/A 02/02/2015   SLF: 1. one colon polyp removed-no source for anemia identified. 2. moderate diverticulosis noted in the sigmoid colon and descending colon 3. the left colon is redundant 4. Rectal bleeding due ot small internal hemorroids 5. Moderate sized external hemorrhoids.  .  ESOPHAGOGASTRODUODENOSCOPY N/A 02/02/2015   SLF: 1. 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 large hiatal hernia 3. mild non-erosive gastritis 4. No source for anemia identified.   Marland Kitchen LEFT HEART CATHETERIZATION WITH CORONARY ANGIOGRAM N/A 09/03/2013   Procedure: LEFT HEART CATHETERIZATION WITH CORONARY ANGIOGRAM;  Surgeon: Jettie Booze, MD;  Location: [facility] C[facility] LAB;  Service: Cardiovascular;  Laterality: N/A;  . Right carpal tunnel release      Current Medications: Current Meds  Medication Sig  . AIMOVIG 70 MG/ML SOAJ Inject 70 mg into the skin every 30 (thirty) days.  Marland Kitchen apixaban (ELIQUIS) 5 MG TABS tablet Take 1 tablet (5 mg total) by mouth 2 (two) times daily.  Marland Kitchen atenolol (TENORMIN) 25 MG tablet Take 1 tablet (25 mg total) by mouth 2 (two) times daily.  Marland Kitchen atorvastatin (LIPITOR) 80 MG tablet Take 1 tablet (80 mg total) by mouth daily.  . Iron-FA-B Cmp-C-Biot-Probiotic (FUSION PLUS) CAPS Take 2 tablets by mouth daily.  Marland Kitchen lisinopril (ZESTRIL) 20 MG tablet Take 1 tablet (20 mg total) by mouth daily.  . metFORMIN (GLUCOPHAGE) 500 MG tablet Take 1 tablet (500 mg total) by mouth 2 (two) times daily with a meal.  . metoCLOPramide (REGLAN) 10 MG tablet Take 1 tablet (10 mg total) by mouth every 8 (eight) hours as needed for up to 7 days for nausea (Headache).  . ondansetron (ZOFRAN ODT) 4 MG disintegrating tablet Take 1 tablet (4  mg total) by mouth every 8 (eight) hours as needed. (Patient taking differently: Take 4 mg by mouth every 8 (eight) hours as needed for nausea or vomiting. )  . pantoprazole (PROTONIX) 40 MG tablet Take 1 tablet by mouth twice daily (Patient taking differently: Take 40 mg by mouth 2 (two) times daily. )  . SUMAtriptan (IMITREX) 6 MG/0.5ML SOLN injection Inject 0.5 mLs (6 mg total) into the skin every 2 (two) hours as needed for migraine or headache. May repeat in 2 hours if headache persists or recurs.  Marland Kitchen tiZANidine (ZANAFLEX) 4 MG tablet Take 1 tablet (4 mg  total) by mouth every 6 (six) hours as needed for muscle spasms.  Marland Kitchen topiramate (TOPAMAX) 50 MG tablet Take 1 tablet at bedtime     Allergies:   Venlafaxine   Social History   Socioeconomic History  . Marital status: Single    Spouse name: Not on file  . Number of children: Not on file  . Years of education: Not on file  . Highest education level: Not on file  Occupational History  . Occupation: Training and development officer: [employer]  . Financial resource strain: Not on file  . Food insecurity    Worry: Not on file    Inability: Not on file  . Transportation needs    Medical: Not on file    Non-medical: Not on file  Tobacco Use  . Smoking status: Former Smoker    Packs/day: 0.30    Types: Cigarettes    Quit date: 04/01/2012    Years since quitting: 6.9  . Smokeless tobacco: Never Used  Substance and Sexual Activity  . Alcohol use: Not Currently    Alcohol/week: 0.0 standard drinks    Comment: Wine typically 2-3 glasses every few weekends  . Drug use: No  . Sexual activity: Yes  Lifestyle  . Physical activity    Days per week: Not on file    Minutes per session: Not on file  . Stress: Not on file  Relationships  . Social Herbalist on phone: Not on file    Gets together: Not on file    Attends religious service: Not on file    Active member of club or organization: Not on file    Attends meetings of clubs or organizations: Not on file    Relationship status: Not on file  Other Topics Concern  . Not on file  Social History Narrative  . Not on file     Family History:  The patient's   family history includes Diabetes in her mother; Heart disease in her mother; Hypertension in her brother and mother.   ROS:   Please see the history of present illness.    ROS All other systems reviewed and are negative.   PHYSICAL EXAM:   VS:  BP (!) 148/91   Pulse 78   Temp (!) 97.1 F (36.2 C) (Temporal)   Ht 5\' 2"  (1.575 m)   Wt 282 lb  (127.9 kg)   SpO2 98%   BMI 51.58 kg/m   Physical Exam  GEN: Obese, in no acute distress  Neck: Subclavian and carotid bruits no JVD,  or masses Cardiac:RRR; 2/6 systolic murmur at the left sternal border and apex Respiratory:  clear to auscultation bilaterally, normal work of breathing GI: soft, nontender, nondistended, + BS Ext: without cyanosis, clubbing, or edema, Good distal pulses bilaterally Neuro:  Alert and Oriented x 3  Psych: euthymic mood, full affect  Wt Readings from Last 3 Encounters:  03/11/19 282 lb (127.9 kg)  03/08/19 285 lb (129.3 kg)  02/25/19 287 lb (130.2 kg)      Studies/Labs Reviewed:   EKG:  EKG is reviewed from 02/12/2019 normal sinus rhythm with LVH and T wave inversion throughout similar to 10/2018  Recent Labs: 10/24/2018: TSH 2.46 02/12/2019: ALT 13; BUN 14; Creatinine, Ser 0.82; Hemoglobin 11.5; Platelets 286; Potassium 3.9; Sodium 138   Lipid Panel    Component Value Date/Time   CHOL 185 01/14/2019 0837   TRIG 120 01/14/2019 0837   HDL 36 (L) 01/14/2019 0837   CHOLHDL 5.1 (H) 01/14/2019 0837   VLDL 13 08/20/2015 0935   LDLCALC 126 (H) 01/14/2019 0837    Additional studies/ records that were reviewed today include:    Echocardiogram: 08/2013 Study Conclusions   - Left ventricle: The cavity size was normal. Wall thickness    was increased in a pattern of mild to moderateLVH.    Systolic function was normal. The estimated ejection    fraction was in the range of 60% to 65%. Wall motion was    normal; there were no regional wall motion abnormalities.    The study is not technically sufficient to allow    evaluation of LV diastolic function.  - Aortic valve: Mildly calcified annulus. Trileaflet. No    significant regurgitation.  - Mitral valve: Calcified annulus. Mild regurgitation.  - Left atrium: The atrium was at the upper limits of normal    in size.  - Right ventricle: The cavity size was mildly dilated.  - Right atrium: Central  venous pressure: 71mm Hg (est).  - Atrial septum: No defect or patent foramen ovale was    identified.  - Tricuspid valve: Mild regurgitation.  - Pulmonary arteries: PA peak pressure: 3mm Hg (S).  - Pericardium, extracardiac: There was no pericardial    effusion.   Cath 2015 IMPRESSIONS:   1. Normal left main coronary artery. 2. Normal left anterior descending artery and its branches. 3. Normal left circumflex artery and its branches. 4. Mild ectasia in the proximal right coronary artery without significant atherosclerosis. 5. Normal left ventricular systolic function.  LVEDP 16 mmHg.  Ejection fraction 60 %. 6.   Severe tortuosity of the right subclavian which prevented utilization of the right radial approach.   RECOMMENDATION:  Continue medical therapy for her atrial fibrillation. Of note, due to tortuosity in her right subclavian, would not use right radial approach if future catheterization is needed.        Electronically signed by Jettie Booze, MD at 09/03/2013  5:18 PM   ASSESSMENT:    1. Dyspnea, unspecified type   2. PAF (paroxysmal atrial fibrillation) ([place])   3. Essential hypertension, benign   4. Hyperlipidemia, unspecified hyperlipidemia type   5. SOB (shortness of breath)   6. Chest pain, unspecified type   7. Morbid obesity ([place])      PLAN:  In order of problems listed above:  Dyspnea and chest pain with recent emergency room visit 02/12/2019 troponins negative chest x-ray unrevealing, EKG normal sinus rhythm with T wave inversion inferior lateral unchanged from prior EKG 10/2018.  Patient is concerned that she might have a blockage.  She did have a normal cardiac cath in 2015.  She does have a heart murmur and had mild MR and TR on echo in 2015.  We will update her echo.  With multiple CV risk factors will  order Lexiscan.   PAF on Eliquis and atenolol-has not had any breakthrough.  CBC and renal function normal 02/12/2019  Essential hypertension  blood pressure up today actually higher on retake 160/90.  Patient says her blood pressure was running 120-130/ 80's.  She will continue to monitor at home and if it still high we will increase her lisinopril to 40 mg daily.  Hyperlipidemia LDL 120s atorvastatin increased to 80 mg by PCP  Obesity importance of weight loss discussed.  Refer to weight loss center in [place].    Medication Adjustments/Labs and Tests Ordered: Current medicines are reviewed at length with the patient today.  Concerns regarding medicines are outlined above.  Medication changes, Labs and Tests ordered today are listed in the Patient Instructions below. Patient Instructions  Medication Instructions:  Your physician recommends that you continue on your current medications as directed. Please refer to the Current Medication list given to you today.   Labwork: none  Testing/Procedures: Your physician has requested that you have an echocardiogram. Echocardiography is a painless test that uses sound waves to create images of your heart. It provides your doctor with information about the size and shape of your heart and how well your heart's chambers and valves are working. This procedure takes approximately one hour. There are no restrictions for this procedure.  Your physician has requested that you have a lexiscan myoview. For further information please visit [email or web address]. Please follow instruction sheet, as given.    Follow-Up: Your physician recommends that you schedule a follow-up appointment in: 3 weeks    Any Other Special Instructions Will Be Listed Below (If Applicable).  Please keep a blood pressure log and bring it to next office visit to be reviewed    If you need a refill on your cardiac medications before your next appointment, please call your pharmacy.      Signed, Ermalinda Barrios, PA-C  03/11/2019 1:28 PM    [facility] [place], [place], [place]   [id number] Phone: [phone]; Fax: [phone]

## 2019-03-07 ENCOUNTER — Telehealth: Payer: Self-pay | Admitting: *Deleted

## 2019-03-07 NOTE — Telephone Encounter (Signed)
Received call from patient.   Reports that she has been having neck and shoulder pain for weeks. States that she is also having muscle spasms in her neck and back.   Denies fever, chest pain.  Inquired as to if this can be because of the inflamed nodes in her neck. Advised that the inflammation should not cause spasms. Advised to schedule OV to evaluate.   Appointment scheduled.

## 2019-03-08 ENCOUNTER — Other Ambulatory Visit: Payer: Self-pay

## 2019-03-08 ENCOUNTER — Encounter: Payer: Self-pay | Admitting: Family Medicine

## 2019-03-08 ENCOUNTER — Ambulatory Visit: Payer: BC Managed Care – PPO | Admitting: Family Medicine

## 2019-03-08 VITALS — BP 124/66 | HR 74 | Temp 97.9°F | Resp 16 | Ht 62.0 in | Wt 285.0 lb

## 2019-03-08 DIAGNOSIS — M62838 Other muscle spasm: Secondary | ICD-10-CM

## 2019-03-08 MED ORDER — TIZANIDINE HCL 4 MG PO TABS: 4.0000 mg | ORAL_TABLET | Freq: Four times a day (QID) | ORAL | 0 refills | Status: DC | PRN

## 2019-03-08 NOTE — Patient Instructions (Signed)
Use zanaflex at bedtime as needed  Use heating pad Topical muscle rubs  F/U as previous

## 2019-03-08 NOTE — Progress Notes (Signed)
   Subjective:    Patient ID: Monique Gomez, female    DOB: [birth year], 63 y.o.   MRN: [id number]  Patient presents for Neck/ Shoulder Pain (spasms in neck caausing pain to radiate to clavicles/ shoulders- decreased ROM to arms)   She is now having muscle spasms in her neck for the past week or so.  States that everything will just tense up feels like she has been exercising and the muscles are just very sore.  She has not done any heavy lifting or overhead lifting.  States she has not done anything she could possibly injure herself.  She does not have as much tenderness over the lymph nodes as she previously have they have gone down in size.  She has not had any fever cough congestion.  She has been under a lot of stress recently she was recalled for her mammogram but that was found to be benign. He does recall having spasms in the past but she also had shoulder pain at that time she was given injection by orthopedics years ago.  She does not have any particular shoulder pain at this time.  She will sometimes feel the spasms and tightness beneath her axilla and around her bra line in the lower thoracic back. No new tingling or numbness in the fingertips.  She Does have a follow-up appoint with cardiology on Monday.  Her breathing did improve when she reduced her Topamax down.   Note she did have a CT of her neck to follow-up the lymph nodes recently there was no musculoskeletal etiology noted on the CT scan   Review Of Systems:  GEN- denies fatigue, fever, weight loss,weakness, recent illness HEENT- denies eye drainage, change in vision, nasal discharge, CVS- denies chest pain, palpitations RESP- denies SOB, cough, wheeze ABD- denies N/V, change in stools, abd pain GU- denies dysuria, hematuria, dribbling, incontinence MSK- denies joint pain, +muscle aches, injury Neuro- denies headache, dizziness, syncope, seizure activity       Objective:    BP 124/66   Pulse 74   Temp 97.9 F  (36.6 C) (Temporal)   Resp 16   Ht 5\' 2"  (1.575 m)   Wt 285 lb (129.3 kg)   SpO2 98%   BMI 52.13 kg/m  GEN- NAD, alert and oriented x3 HEENT- PERRL, EOMI, non injected sclera, pink conjunctiva, MMM, oropharynx clear, nares clear rhinorrhea, no sinus tenderness Nodes- no axillary nodes, no submandibular nodes palpated  Neck- Supple, no thyromegaly, left supraclavicular nodes not easily palpated, NT,   FROM neck, pain with rotation of neck bilat , TTP across trapezius CVS- RRR, no murmur RESP-CTAB MSK- FROM Upper ext, rotator cuff in tact  Pulses- Radial 2+        Assessment & Plan:      Problem List Items Addressed This Visit    None    Visit Diagnoses    Neck muscle spasm    -  Primary   MSK pain, she can use topical muscle rub, heating pad, zanaflex if that doesnt help, No red flags one EXAM. I dont think the lymph nodes are contributing to this pain. She has been very stressed recently, I think it is muscle tension causing the pain      Note: This dictation was prepared with Dragon dictation along with smaller phrase technology. Any transcriptional errors that result from this process are unintentional.

## 2019-03-11 ENCOUNTER — Other Ambulatory Visit: Payer: Self-pay

## 2019-03-11 ENCOUNTER — Telehealth: Payer: Self-pay | Admitting: Cardiology

## 2019-03-11 ENCOUNTER — Ambulatory Visit (INDEPENDENT_AMBULATORY_CARE_PROVIDER_SITE_OTHER): Payer: BC Managed Care – PPO | Admitting: Physician Assistant

## 2019-03-11 ENCOUNTER — Encounter: Payer: Self-pay | Admitting: Physician Assistant

## 2019-03-11 VITALS — BP 148/91 | HR 78 | Temp 97.1°F | Ht 62.0 in | Wt 282.0 lb

## 2019-03-11 DIAGNOSIS — E785 Hyperlipidemia, unspecified: Secondary | ICD-10-CM | POA: Diagnosis not present

## 2019-03-11 DIAGNOSIS — I10 Essential (primary) hypertension: Secondary | ICD-10-CM

## 2019-03-11 DIAGNOSIS — R0602 Shortness of breath: Secondary | ICD-10-CM

## 2019-03-11 DIAGNOSIS — R0600 Dyspnea, unspecified: Secondary | ICD-10-CM

## 2019-03-11 DIAGNOSIS — R079 Chest pain, unspecified: Secondary | ICD-10-CM

## 2019-03-11 DIAGNOSIS — I480 Paroxysmal atrial fibrillation: Secondary | ICD-10-CM | POA: Diagnosis not present

## 2019-03-11 NOTE — Patient Instructions (Signed)
Medication Instructions:  Your physician recommends that you continue on your current medications as directed. Please refer to the Current Medication list given to you today.   Labwork: none  Testing/Procedures: Your physician has requested that you have an echocardiogram. Echocardiography is a painless test that uses sound waves to create images of your heart. It provides your doctor with information about the size and shape of your heart and how well your heart's chambers and valves are working. This procedure takes approximately one hour. There are no restrictions for this procedure.  Your physician has requested that you have a lexiscan myoview. For further information please visit [email or web address]. Please follow instruction sheet, as given.    Follow-Up: Your physician recommends that you schedule a follow-up appointment in: 3 weeks    Any Other Special Instructions Will Be Listed Below (If Applicable).  Please keep a blood pressure log and bring it to next office visit to be reviewed    If you need a refill on your cardiac medications before your next appointment, please call your pharmacy.

## 2019-03-11 NOTE — Telephone Encounter (Signed)
°  Precert needed for: Lexiscan & Echo   Location: [facility]    Date: Mar 14, 2019

## 2019-03-12 ENCOUNTER — Encounter (HOSPITAL_COMMUNITY): Payer: BC Managed Care – PPO

## 2019-03-12 ENCOUNTER — Other Ambulatory Visit (HOSPITAL_COMMUNITY): Payer: BC Managed Care – PPO

## 2019-03-14 ENCOUNTER — Encounter (HOSPITAL_BASED_OUTPATIENT_CLINIC_OR_DEPARTMENT_OTHER)
Admission: RE | Admit: 2019-03-14 | Discharge: 2019-03-14 | Disposition: A | Discharge status: Home / Self Care | Payer: BC Managed Care – PPO | Source: Ambulatory Visit | Attending: Physician Assistant | Admitting: Physician Assistant

## 2019-03-14 ENCOUNTER — Other Ambulatory Visit: Payer: Self-pay

## 2019-03-14 ENCOUNTER — Ambulatory Visit (HOSPITAL_COMMUNITY)
Admission: RE | Admit: 2019-03-14 | Discharge: 2019-03-14 | Disposition: A | Discharge status: Home / Self Care | Payer: BC Managed Care – PPO | Source: Ambulatory Visit | Attending: Physician Assistant | Admitting: Physician Assistant

## 2019-03-14 ENCOUNTER — Encounter (HOSPITAL_COMMUNITY)
Admission: RE | Admit: 2019-03-14 | Discharge: 2019-03-14 | Disposition: A | Discharge status: Home / Self Care | Payer: BC Managed Care – PPO | Source: Ambulatory Visit | Attending: Physician Assistant | Admitting: Physician Assistant

## 2019-03-14 ENCOUNTER — Encounter (HOSPITAL_COMMUNITY): Payer: Self-pay

## 2019-03-14 DIAGNOSIS — R079 Chest pain, unspecified: Secondary | ICD-10-CM | POA: Insufficient documentation

## 2019-03-14 DIAGNOSIS — R0600 Dyspnea, unspecified: Secondary | ICD-10-CM | POA: Insufficient documentation

## 2019-03-14 DIAGNOSIS — R0602 Shortness of breath: Secondary | ICD-10-CM | POA: Insufficient documentation

## 2019-03-14 LAB — NM MYOCAR MULTI W/SPECT W/WALL MOTION / EF
LV dias vol: 71 mL (ref 46–106)
LV sys vol: 25 mL
Peak HR: 113 {beats}/min
RATE: 0.22
Rest HR: 64 {beats}/min
SDS: 2
SRS: 10
SSS: 12
TID: 1.19

## 2019-03-14 MED ORDER — SODIUM CHLORIDE FLUSH 0.9 % IV SOLN
INTRAVENOUS | Status: AC
  Administered 2019-03-14: 10 mL via INTRAVENOUS
  Filled 2019-03-14: qty 10

## 2019-03-14 MED ORDER — REGADENOSON 0.4 MG/5ML IV SOLN
INTRAVENOUS | Status: AC
  Administered 2019-03-14: 0.4 mg via INTRAVENOUS
  Filled 2019-03-14: qty 5

## 2019-03-14 MED ORDER — TECHNETIUM TC 99M TETROFOSMIN IV KIT
30.0000 | PACK | Freq: Once | INTRAVENOUS | Status: AC | PRN
  Administered 2019-03-14: 31.6 via INTRAVENOUS

## 2019-03-14 MED ORDER — TECHNETIUM TC 99M TETROFOSMIN IV KIT
10.0000 | PACK | Freq: Once | INTRAVENOUS | Status: AC | PRN
  Administered 2019-03-14: 10.5 via INTRAVENOUS

## 2019-03-14 NOTE — Progress Notes (Signed)
*  PRELIMINARY RESULTS* Echocardiogram 2D Echocardiogram has been performed.  Monique Gomez 03/14/2019, 12:31 PM

## 2019-03-18 DIAGNOSIS — Z961 Presence of intraocular lens: Secondary | ICD-10-CM | POA: Diagnosis not present

## 2019-03-18 DIAGNOSIS — H25811 Combined forms of age-related cataract, right eye: Secondary | ICD-10-CM | POA: Diagnosis not present

## 2019-03-18 DIAGNOSIS — H26492 Other secondary cataract, left eye: Secondary | ICD-10-CM | POA: Diagnosis not present

## 2019-03-25 NOTE — H&P (Signed)
Surgical History & Physical  Patient Name: Monique Gomez DOB: [birth year]  Surgery: Cataract extraction with intraocular lens implant phacoemulsification; Right Eye  Surgeon: Baruch Goldmann MD Surgery Date:  04/05/2019 Pre-Op Date:  03/18/2019  HPI: [name] is referred by Dr Monique Gomez for cataract eval OD 1. The patient complains of difficulty when viewing TV, reading closed caption, news scrolls on TV, which began many years ago. The right eye is affected. The episode is gradual. The condition's severity increased since last visit. Symptoms occur when the patient is driving, outside and reading. The complaint is associated with glare. Pt has floaters OD, no flashing of lights. HPI was performed by Baruch Goldmann .  Medical History: Cataracts Peripheral drusen, Fuchs Corneaal dystrophy OU, Hx of DED Diabetes GERD, Migraine High Blood Pressure LDL  Review of Systems Allergic/Immunologic Seasonal Allergies All recorded systems are negative except as noted above.  Social   Former smoker   Medication Amlodipine Besylate, Amoxicillin, Atenolol, Atorvastatin, Bromopheniramine, Acetaminophinen, Elliquis, Lisinopril, Metformin, Ondansetron, Pantoprazole, Topiramate, Rizatrispan, [place]ig,   Sx/Procedures Cataract Surgery,   Drug Allergies   NKDA  History & Physical: Heent:  Cataract, Right eye NECK: supple without bruits LUNGS: lungs clear to auscultation CV: regular rate and rhythm Abdomen: soft and non-tender  Impression & Plan: Assessment: 1.  COMBINED FORMS AGE RELATED CATARACT; Right Eye (H25.811) 2.  PCO; Left Eye (H26.492) 3.  INTRAOCULAR LENS IOL (Z96.1)  Plan: 1.  Cataract accounts for the patient's decreased vision. This visual impairment is not correctable with a tolerable change in glasses or contact lenses. Cataract surgery with an implantation of a new lens should significantly improve the visual and functional status of the patient. Discussed  all risks, benefits, alternatives, and potential complications. Discussed the procedures and recovery. Patient desires to have surgery. A-scan ordered and performed today for intra-ocular lens calculations. The surgery will be performed in order to improve vision for driving, reading, and for eye examinations. Recommend phacoemulsification with intra-ocular lens. Right Eye. Dilates well - shugarcaine by protocol. 2.  Asymptomatic. Findings, prognosis and treatment options reviewed. No indication for laser at this point, will observe for changes. 3.  Mild PCO OS as above.

## 2019-04-01 ENCOUNTER — Telehealth: Payer: Self-pay | Admitting: *Deleted

## 2019-04-01 DIAGNOSIS — H25811 Combined forms of age-related cataract, right eye: Secondary | ICD-10-CM | POA: Diagnosis not present

## 2019-04-01 NOTE — Telephone Encounter (Signed)
Received call from patient.   Reports that  She has severe sinus pressure and her face is swollen around her sinuses.   States that she has been taking Mucinex, Flonase, and APAP. Advised to add nasal saline and humidifier.   Appointment scheduled for Telehealth visit on 04/02/2019.

## 2019-04-02 ENCOUNTER — Encounter: Payer: Self-pay | Admitting: Family Medicine

## 2019-04-02 ENCOUNTER — Other Ambulatory Visit: Payer: Self-pay

## 2019-04-02 ENCOUNTER — Encounter: Payer: Self-pay | Admitting: Student

## 2019-04-02 ENCOUNTER — Ambulatory Visit (INDEPENDENT_AMBULATORY_CARE_PROVIDER_SITE_OTHER): Payer: BC Managed Care – PPO | Admitting: Family Medicine

## 2019-04-02 ENCOUNTER — Ambulatory Visit: Payer: BC Managed Care – PPO | Admitting: Student

## 2019-04-02 VITALS — BP 110/68 | HR 73 | Temp 96.8°F | Ht 62.0 in | Wt 278.0 lb

## 2019-04-02 DIAGNOSIS — J309 Allergic rhinitis, unspecified: Secondary | ICD-10-CM

## 2019-04-02 DIAGNOSIS — J329 Chronic sinusitis, unspecified: Secondary | ICD-10-CM

## 2019-04-02 DIAGNOSIS — I480 Paroxysmal atrial fibrillation: Secondary | ICD-10-CM | POA: Diagnosis not present

## 2019-04-02 DIAGNOSIS — I10 Essential (primary) hypertension: Secondary | ICD-10-CM | POA: Diagnosis not present

## 2019-04-02 DIAGNOSIS — R079 Chest pain, unspecified: Secondary | ICD-10-CM | POA: Diagnosis not present

## 2019-04-02 DIAGNOSIS — E782 Mixed hyperlipidemia: Secondary | ICD-10-CM | POA: Diagnosis not present

## 2019-04-02 DIAGNOSIS — G4733 Obstructive sleep apnea (adult) (pediatric): Secondary | ICD-10-CM

## 2019-04-02 MED ORDER — ATENOLOL 25 MG PO TABS: 25.0000 mg | ORAL_TABLET | Freq: Every day | ORAL | 3 refills | Status: DC

## 2019-04-02 NOTE — Patient Instructions (Signed)
Medication Instructions:  Your physician has recommended you make the following change in your medication:  Decrease Atenolol to 25 mg Daily   *If you need a refill on your cardiac medications before your next appointment, please call your pharmacy*  Lab Work: NONE   If you have labs (blood work) drawn today and your tests are completely normal, you will receive your results only by: Marland Kitchen MyChart Message (if you have MyChart) OR . A paper copy in the mail If you have any lab test that is abnormal or we need to change your treatment, we will call you to review the results.  Testing/Procedures: NONE   Follow-Up: At [facility], you and your health needs are our priority.  As part of our continuing mission to provide you with exceptional heart care, we have created designated Provider Care Teams.  These Care Teams include your primary Cardiologist (physician) and Advanced Practice Providers (APPs -  Physician Assistants and Nurse Practitioners) who all work together to provide you with the care you need, when you need it.  Your next appointment:   6 month(s)  The format for your next appointment:   In Person  Provider:   Rozann Lesches, MD  Other Instructions Thank you for choosing [facility]!

## 2019-04-02 NOTE — Progress Notes (Signed)
Virtual Visit via Telephone Note  I connected with Monique Gomez on 04/02/19 at  9:15 AM EST by telephone and verified that I am speaking with the correct person using two identifiers.       Pt location: at home   Physician location:  In office, [facility], Vic Blackbird MD     On call: patient and physician   I discussed the limitations, risks, security and privacy concerns of performing an evaluation and management service by telephone and the availability of in person appointments. I also discussed with the patient that there may be a patient responsible charge related to this service. The patient expressed understanding and agreed to proceed.   History of Present Illness:  Pt has had chronic sinus issues/ headaches for the past few months on and off, along with other issues we have addressed. This past week it worsened states that she was having significant amount of clear drainage from nose as well as some sneezing.  Her coworker noted that her face seemed swollen yesterday and she had some pain over her cheeks.  She started using Mucinex twice a day and Flonase every 12 hours and this is actually improved.  This morning states that she woke up for the first time without any sinus headache the facial pain and swelling has gone down.  no fever, no cough, no bodyaches, no ear pain, no sore throat.  She still has the small left sided supraclaviular lymph nodes but they have not enlarged   She has cataract surgery on Friday/ she has covid testing set up for Thursday for pre surgery      Observations/Objective: Telephone visit unable to visualize no acute distress normal speech  Assessment and Plan: Allergic rhinitis chronic sinusitis I do not think that this is bacterial infection at this time.  She has been treated with antibiotics a few times over the past few months.  Her sinuses actually improved after she was able to decongest and drain.  She will continue the  Mucinex twice a day Flonase as tolerated as well as nasal saline and add antihistamine like we have discussed before.  She is already scheduled for Covid testing this week and her cataract surgery  Follow Up Instructions:    I discussed the assessment and treatment plan with the patient. The patient was provided an opportunity to ask questions and all were answered. The patient agreed with the plan and demonstrated an understanding of the instructions.   The patient was advised to call back or seek an in-person evaluation if the symptoms worsen or if the condition fails to improve as anticipated.  I provided 10  minutes of non-face-to-face time during this encounter. End Time 9:25am   Vic Blackbird, MD

## 2019-04-02 NOTE — Progress Notes (Signed)
Cardiology Office Note    Date:  04/02/2019   ID:  Monique Gomez, Monique Gomez [birth year], MRN [id number]  PCP:  Alycia Rossetti, MD  Cardiologist: Rozann Lesches, MD    Chief Complaint  Patient presents with  . Follow-up    3 week visit    History of Present Illness:    Monique Gomez is a 63 y.o. female with past medical history of paroxysmal atrial fibrillation, HTN, HLD, Type II DM and GERD who presents to the office today for 3-week follow-up.  She was last examined by Ermalinda Barrios, PA-C on 03/11/2019 following a recent ED visit for chest pain and shortness of breath. She reported that her chest felt "achy" as if she had just exercised. Recent EKG showed no acute changes but given her risk factors it was recommended to have a Lexiscan Myoview and an updated echocardiogram for further evaluation. Echocardiogram showed a preserved EF of 70-75% with moderately increased LVH and asymmetric septal hypertrophy. She did have Grade 1 DD along with mild mitral regurgitation and mild tricuspid regurgitation. Stress testing showed a small inferior apical and mid inferior lateral defect which was partially reversible in the setting of breast tissue attenuation, felt to be an overall low risk study. It was recommended to increase Lisinopril to 40 mg daily if BP remained elevated but she reported this was well controlled at home.  In talking with the patient today, she denies any recent exertional chest pain but does report occasional episodes of discomfort when leaning forward. Says she was told in the past this might be due to her breast movement. She did undergo a mammogram recently which showed an area of "bruising" per her report with repeat imaging recommended in several months.  She denies any dyspnea on exertion, orthopnea, PND or lower extremity edema. She does report feeling fatigued throughout the day and having little energy. Says that BP has overall been well controlled when checked at  home.  She has made significant dietary changes this year and has reduced her sodium intake and has cut out all fast food.  She has lost over 30 pounds within the past several months.    Past Medical History:  Diagnosis Date  . Allergic rhinitis   . Anemia   . Essential hypertension   . GERD (gastroesophageal reflux disease)   . Headache(784.0)   . History of cardiac catheterization    No significant CAD May 2015  . History of migraine headaches   . Hyperlipidemia   . Obesity   . PAF (paroxysmal atrial fibrillation) ([place]) 08/2013  . Type 2 diabetes mellitus ([place])     Past Surgical History:  Procedure Laterality Date  . CATARACT EXTRACTION    . COLONOSCOPY N/A 02/02/2015   SLF: 1. one colon polyp removed-no source for anemia identified. 2. moderate diverticulosis noted in the sigmoid colon and descending colon 3. the left colon is redundant 4. Rectal bleeding due ot small internal hemorroids 5. Moderate sized external hemorrhoids.  . ESOPHAGOGASTRODUODENOSCOPY N/A 02/02/2015   SLF: 1. 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 large hiatal hernia 3. mild non-erosive gastritis 4. No source for anemia identified.   Marland Kitchen LEFT HEART CATHETERIZATION WITH CORONARY ANGIOGRAM N/A 09/03/2013   Procedure: LEFT HEART CATHETERIZATION WITH CORONARY ANGIOGRAM;  Surgeon: Jettie Booze, MD;  Location: [facility] CATH LAB;  Service: Cardiovascular;  Laterality: N/A;  . Right carpal tunnel release      Current Medications: Outpatient Medications Prior to Visit  Medication  Sig Dispense Refill  . acetaminophen (TYLENOL) 325 MG tablet Take 650 mg by mouth every 6 (six) hours as needed for moderate pain.    Marland Kitchen AIMOVIG 70 MG/ML SOAJ Inject 70 mg into the skin every 30 (thirty) days. 1 pen 11  . apixaban (ELIQUIS) 5 MG TABS tablet Take 1 tablet (5 mg total) by mouth 2 (two) times daily. 180 tablet 3  . atorvastatin (LIPITOR) 80 MG tablet Take 1 tablet (80 mg total) by mouth daily. 90 tablet 3  .  fluticasone (FLONASE) 50 MCG/ACT nasal spray Place 2 sprays into both nostrils daily.    Marland Kitchen guaiFENesin (MUCINEX) 600 MG 12 hr tablet Take 600 mg by mouth every other day.    . Iron-FA-B Cmp-C-Biot-Probiotic (FUSION PLUS) CAPS Take 2 tablets by mouth daily. 60 capsule 3  . lisinopril (ZESTRIL) 20 MG tablet Take 1 tablet (20 mg total) by mouth daily. 90 tablet 1  . metFORMIN (GLUCOPHAGE) 500 MG tablet Take 1 tablet (500 mg total) by mouth 2 (two) times daily with a meal. 180 tablet 3  . metoCLOPramide (REGLAN) 10 MG tablet Take 1 tablet (10 mg total) by mouth every 8 (eight) hours as needed for up to 7 days for nausea (Headache). 21 tablet 0  . ondansetron (ZOFRAN ODT) 4 MG disintegrating tablet Take 1 tablet (4 mg total) by mouth every 8 (eight) hours as needed. (Patient taking differently: Take 4 mg by mouth every 8 (eight) hours as needed for nausea or vomiting. ) 20 tablet 6  . pantoprazole (PROTONIX) 40 MG tablet Take 1 tablet by mouth twice daily (Patient taking differently: Take 40 mg by mouth 2 (two) times daily. ) 180 tablet 0  . SUMAtriptan (IMITREX) 6 MG/0.5ML SOLN injection Inject 0.5 mLs (6 mg total) into the skin every 2 (two) hours as needed for migraine or headache. May repeat in 2 hours if headache persists or recurs. 6 mL 6  . tiZANidine (ZANAFLEX) 4 MG tablet Take 1 tablet (4 mg total) by mouth every 6 (six) hours as needed for muscle spasms. 30 tablet 0  . topiramate (TOPAMAX) 50 MG tablet Take 1 tablet at bedtime (Patient taking differently: Take 50 mg by mouth at bedtime. ) 90 tablet 3  . atenolol (TENORMIN) 25 MG tablet Take 1 tablet (25 mg total) by mouth 2 (two) times daily. 180 tablet 3   No facility-administered medications prior to visit.      Allergies:   Venlafaxine   Social History   Socioeconomic History  . Marital status: Single    Spouse name: Not on file  . Number of children: Not on file  . Years of education: Not on file  . Highest education level: Not on  file  Occupational History  . Occupation: Training and development officer: [employer]  . Financial resource strain: Not on file  . Food insecurity    Worry: Not on file    Inability: Not on file  . Transportation needs    Medical: Not on file    Non-medical: Not on file  Tobacco Use  . Smoking status: Former Smoker    Packs/day: 0.30    Types: Cigarettes    Quit date: 04/01/2012    Years since quitting: 7.0  . Smokeless tobacco: Never Used  Substance and Sexual Activity  . Alcohol use: Not Currently    Alcohol/week: 0.0 standard drinks    Comment: Wine typically 2-3 glasses every few weekends  .  Drug use: No  . Sexual activity: Yes  Lifestyle  . Physical activity    Days per week: Not on file    Minutes per session: Not on file  . Stress: Not on file  Relationships  . Social Herbalist on phone: Not on file    Gets together: Not on file    Attends religious service: Not on file    Active member of club or organization: Not on file    Attends meetings of clubs or organizations: Not on file    Relationship status: Not on file  Other Topics Concern  . Not on file  Social History Narrative  . Not on file     Family History:  The patient's family history includes Diabetes in her mother; Heart disease in her mother; Hypertension in her brother and mother.   Review of Systems:   Please see the history of present illness.     General:  No chills, fever, night sweats or weight changes. Positive for fatigue.  Cardiovascular:  No chest pain, dyspnea on exertion, edema, orthopnea, palpitations, paroxysmal nocturnal dyspnea.  Dermatological: No rash, lesions/masses Respiratory: No cough, dyspnea Urologic: No hematuria, dysuria Abdominal:   No nausea, vomiting, diarrhea, bright red blood per rectum, melena, or hematemesis Neurologic:  No visual changes, wkns, changes in mental status. All other systems reviewed and are otherwise negative except as  noted above.   Physical Exam:    VS:  BP 110/68   Pulse 73   Temp (!) 96.8 F (36 C) (Temporal)   Ht 5\' 2"  (1.575 m)   Wt 278 lb (126.1 kg)   BMI 50.85 kg/m    General: Well developed, well nourished,female appearing in no acute distress. Head: Normocephalic, atraumatic, sclera non-icteric, no xanthomas, nares are without discharge.  Neck: No carotid bruits. JVD not elevated.  Lungs: Respirations regular and unlabored, without wheezes or rales.  Heart: Regular rate and rhythm. No S3 or S4.  No murmur, no rubs, or gallops appreciated. Abdomen: Soft, non-tender, non-distended with normoactive bowel sounds. No hepatomegaly. No rebound/guarding. No obvious abdominal masses. Msk:  Strength and tone appear normal for age. No joint deformities or effusions. Extremities: No clubbing or cyanosis. No lower extremity edema.  Distal pedal pulses are 2+ bilaterally. Neuro: Alert and oriented X 3. Moves all extremities spontaneously. No focal deficits noted. Psych:  Responds to questions appropriately with a normal affect. Skin: No rashes or lesions noted  Wt Readings from Last 3 Encounters:  04/02/19 278 lb (126.1 kg)  03/11/19 282 lb (127.9 kg)  03/08/19 285 lb (129.3 kg)      Studies/Labs Reviewed:   EKG:  EKG is not ordered today.   Recent Labs: 10/24/2018: TSH 2.46 02/12/2019: ALT 13; BUN 14; Creatinine, Ser 0.82; Hemoglobin 11.5; Platelets 286; Potassium 3.9; Sodium 138   Lipid Panel    Component Value Date/Time   CHOL 185 01/14/2019 0837   TRIG 120 01/14/2019 0837   HDL 36 (L) 01/14/2019 0837   CHOLHDL 5.1 (H) 01/14/2019 0837   VLDL 13 08/20/2015 0935   LDLCALC 126 (H) 01/14/2019 0837    Additional studies/ records that were reviewed today include:   Echocardiogram: 03/2019 IMPRESSIONS    1. Left ventricular ejection fraction, by visual estimation, is 70 to 75%. The left ventricle has hyperdynamic function. There is moderately increased left ventricular hypertrophy  with asymmetric septal hypertrophy and mild LVOT gradient with Valsalva.  2. Left ventricular diastolic parameters are consistent with  Grade I diastolic dysfunction (impaired relaxation).  3. Global right ventricle has normal systolic function.The right ventricular size is normal. Mildly increased right ventricular wall thickness.  4. Left atrial size was normal.  5. Right atrial size was normal.  6. Mild to moderate aortic valve annular calcification.  7. The mitral valve is grossly normal. Mild mitral valve regurgitation.  8. The tricuspid valve is grossly normal. Tricuspid valve regurgitation is mild.  9. The aortic valve is tricuspid. Aortic valve regurgitation is not visualized. 10. The pulmonic valve was grossly normal. Pulmonic valve regurgitation is trivial. 11. Normal pulmonary artery systolic pressure. 12. The inferior vena cava is normal in size with greater than 50% respiratory variability, suggesting right atrial pressure of 3 mmHg.   NST: 03/2019  Equivocal ST segment changes following Lexiscan in the setting of baseline inferolateral ST-T wave abnormalities and increased voltage.  Small, mild intensity, inferior apical and mid inferolateral defects that are partially reversible in the setting of breast tissue attenuation. Consider variable soft tissue attenuation, although cannot completely exclude small, mild ischemic territories.  This is a low risk study.  Nuclear stress EF: 65%.  Assessment:    1. PAF (paroxysmal atrial fibrillation) (HCC)   2. Chest pain, unspecified type   3. Essential hypertension, benign   4. Mixed hyperlipidemia   5. Obstructive sleep apnea      Plan:   In order of problems listed above:  1. Paroxysmal Atrial Fibrillation - She denies any recent palpitations and reports HR has overall been well controlled when checked at home. She reports her fatigue significantly improved with dose reduction of Atenolol approximately 4 months ago. Given  her persistent fatigue and reassuring cardiac work-up over the past month, will reduce Atenolol to 25 mg daily to see if this helps with her symptoms. I did encourage her to take this in the evening hours and we reviewed she could take an extra 25mg  tablet if needed for palpitations. - She denies any evidence of active bleeding.  Remains on Eliquis 5 mg twice daily.  2. Atypical Chest Pain - She was previously experiencing episodes of chest discomfort which was worse with leaning forward and not associated with exertion. Recent NST showed a small defect as outlined above which was overall consistent with breast tissue attenuation but very mild ischemia could not be excluded. Was overall a low-risk study. - She reports her symptoms have improved and denies any exertional pain. Continue with risk factor modification at this time.  3. HTN - BP is well controlled at 110/68 during today's visit. She is currently on Atenolol 25 mg twice daily and Lisinopril 20 mg daily. Given her fatigue, will try reducing Atenolol to 25 mg daily to see if this helps. If her fatigue improves but BP becomes elevated, would further titrate Lisinopril. She has lost over 30 pounds within the past several months due to dietary changes and is hopeful her BP medications can continue to be reduced. Was congratulated on her weight loss with continued lifestyle modifications encouraged.  4. HLD - Followed by PCP. FLP in 01/2019 showed total cholesterol 185, HDL 36, triglycerides 120 and LDL 126 with Atorvastatin being increased to 80 mg daily.  5. OSA - continued compliance with CPAP encouraged.     Medication Adjustments/Labs and Tests Ordered: Current medicines are reviewed at length with the patient today.  Concerns regarding medicines are outlined above.  Medication changes, Labs and Tests ordered today are listed in the Patient Instructions below. Patient Instructions  Medication Instructions:  Your physician has  recommended you make the following change in your medication:  Decrease Atenolol to 25 mg Daily   *If you need a refill on your cardiac medications before your next appointment, please call your pharmacy*  Lab Work: NONE   If you have labs (blood work) drawn today and your tests are completely normal, you will receive your results only by: Marland Kitchen MyChart Message (if you have MyChart) OR . A paper copy in the mail If you have any lab test that is abnormal or we need to change your treatment, we will call you to review the results.  Testing/Procedures: NONE   Follow-Up: At [facility], you and your health needs are our priority.  As part of our continuing mission to provide you with exceptional heart care, we have created designated Provider Care Teams.  These Care Teams include your primary Cardiologist (physician) and Advanced Practice Providers (APPs -  Physician Assistants and Nurse Practitioners) who all work together to provide you with the care you need, when you need it.  Your next appointment:   6 month(s)  The format for your next appointment:   In Person  Provider:   Rozann Lesches, MD  Other Instructions Thank you for choosing [facility]!    Signed, Erma Heritage, PA-C  04/02/2019 4:27 PM    [facility] [address] [place], [place] [address] Phone: [phone] Fax: [phone]

## 2019-04-03 ENCOUNTER — Other Ambulatory Visit: Payer: Self-pay | Admitting: Family Medicine

## 2019-04-03 ENCOUNTER — Ambulatory Visit: Payer: BC Managed Care – PPO | Admitting: Cardiology

## 2019-04-03 DIAGNOSIS — K219 Gastro-esophageal reflux disease without esophagitis: Secondary | ICD-10-CM

## 2019-04-04 ENCOUNTER — Encounter (HOSPITAL_COMMUNITY): Payer: Self-pay

## 2019-04-04 ENCOUNTER — Encounter (HOSPITAL_COMMUNITY)
Admission: RE | Admit: 2019-04-04 | Discharge: 2019-04-04 | Disposition: A | Discharge status: Home / Self Care | Payer: BC Managed Care – PPO | Source: Ambulatory Visit | Attending: Ophthalmology | Admitting: Ophthalmology

## 2019-04-04 ENCOUNTER — Other Ambulatory Visit: Payer: Self-pay

## 2019-04-04 ENCOUNTER — Other Ambulatory Visit (HOSPITAL_COMMUNITY)
Admission: RE | Admit: 2019-04-04 | Discharge: 2019-04-04 | Disposition: A | Discharge status: Home / Self Care | Payer: BC Managed Care – PPO | Source: Ambulatory Visit | Attending: Ophthalmology | Admitting: Ophthalmology

## 2019-04-04 DIAGNOSIS — Z01812 Encounter for preprocedural laboratory examination: Secondary | ICD-10-CM | POA: Diagnosis not present

## 2019-04-04 DIAGNOSIS — Z20828 Contact with and (suspected) exposure to other viral communicable diseases: Secondary | ICD-10-CM | POA: Insufficient documentation

## 2019-04-04 LAB — SARS CORONAVIRUS 2 (TAT 6-24 HRS): SARS Coronavirus 2: NEGATIVE

## 2019-04-04 NOTE — Patient Instructions (Signed)
Monique Gomez  04/04/2019     @PREFPERIOPPHARMACY @   Your procedure is scheduled on  04/05/2019 .  Report to [facility] at  [facility]M.  Call this number if you have problems the morning of surgery:  [phone]   Remember:  Do not eat or drink after midnight.                      Take these medicines the morning of surgery with A SIP OF WATER  Atenolol, zofran(if needed), protonix. DO NOT take any medications for diabetes the morning of your surgery.    Do not wear jewelry, make-up or nail polish.  Do not wear lotions, powders, or perfumes. Please wear deodorant and brush your teeth.  Do not shave 48 hours prior to surgery.  Men may shave face and neck.  Do not bring valuables to the hospital.  [facility] is not responsible for any belongings or valuables.  Contacts, dentures or bridgework may not be worn into surgery.  Leave your suitcase in the car.  After surgery it may be brought to your room.  For patients admitted to the hospital, discharge time will be determined by your treatment team.  Patients discharged the day of surgery will not be allowed to drive home.   Name and phone number of your driver:   family Special instructions:  None  Please read over the following fact sheets that you were given. Anesthesia Post-op Instructions and Care and Recovery After Surgery       Cataract Surgery, Care After This sheet gives you information about how to care for yourself after your procedure. Your health care provider may also give you more specific instructions. If you have problems or questions, contact your health care provider. What can I expect after the procedure? After the procedure, it is common to have:  Itching.  Discomfort.  Fluid discharge.  Sensitivity to light and to touch.  Bruising in or around the eye.  Mild blurred vision. Follow these instructions at home: Eye care   Do not touch or rub your eyes.  Protect your eyes as told  by your health care provider. You may be told to wear a protective eye shield or sunglasses.  Do not put a contact lens into the affected eye or eyes until your health care provider approves.  Keep the area around your eye clean and dry: ? Avoid swimming. ? Do not allow water to hit you directly in the face while showering. ? Keep soap and shampoo out of your eyes.  Check your eye every day for signs of infection. Watch for: ? Redness, swelling, or pain. ? Fluid, blood, or pus. ? Warmth. ? A bad smell. ? Vision that is getting worse. ? Sensitivity that is getting worse. Activity  Do not drive for 24 hours if you were given a sedative during your procedure.  Avoid strenuous activities, such as playing contact sports, for as long as told by your health care provider.  Do not drive or use heavy machinery until your health care provider approves.  Do not bend or lift heavy objects. Bending increases pressure in the eye. You can walk, climb stairs, and do light household chores.  Ask your health care provider when you can return to work. If you work in a dusty environment, you may be advised to wear protective eyewear for a period of time. General instructions  Take or apply  over-the-counter and prescription medicines only as told by your health care provider. This includes eye drops.  Keep all follow-up visits as told by your health care provider. This is important. Contact a health care provider if:  You have increased bruising around your eye.  You have pain that is not helped with medicine.  You have a fever.  You have redness, swelling, or pain in your eye.  You have fluid, blood, or pus coming from your incision.  Your vision gets worse.  Your sensitivity to light gets worse. Get help right away if:  You have sudden loss of vision.  You see flashes of light or spots (floaters).  You have severe eye pain.  You develop nausea or vomiting. Summary  After your  procedure, it is common to have itching, discomfort, bruising, fluid discharge, or sensitivity to light.  Follow instructions from your health care provider about caring for your eye after the procedure.  Do not rub your eye after the procedure. You may need to wear eye protection or sunglasses. Do not wear contact lenses. Keep the area around your eye clean and dry.  Avoid activities that require a lot of effort. These include playing sports and lifting heavy objects.  Contact a health care provider if you have increased bruising, pain that does not go away, or a fever. Get help right away if you suddenly lose your vision, see flashes of light or spots, or have severe pain in the eye. This information is not intended to replace advice given to you by your health care provider. Make sure you discuss any questions you have with your health care provider. Document Released: 11/05/2004 Document Revised: 10/16/2017 Document Reviewed: 10/16/2017 Elsevier Patient Education  2020 [place] After These instructions provide you with information about caring for yourself after your procedure. Your health care provider may also give you more specific instructions. Your treatment has been planned according to current medical practices, but problems sometimes occur. Call your health care provider if you have any problems or questions after your procedure. What can I expect after the procedure? After your procedure, you may:  Feel sleepy for several hours.  Feel clumsy and have poor balance for several hours.  Feel forgetful about what happened after the procedure.  Have poor judgment for several hours.  Feel nauseous or vomit.  Have a sore throat if you had a breathing tube during the procedure. Follow these instructions at home: For at least 24 hours after the procedure:      Have a responsible adult stay with you. It is important to have someone help care  for you until you are awake and alert.  Rest as needed.  Do not: ? Participate in activities in which you could fall or become injured. ? Drive. ? Use heavy machinery. ? Drink alcohol. ? Take sleeping pills or medicines that cause drowsiness. ? Make important decisions or sign legal documents. ? Take care of children on your own. Eating and drinking  Follow the diet that is recommended by your health care provider.  If you vomit, drink water, juice, or soup when you can drink without vomiting.  Make sure you have little or no nausea before eating solid foods. General instructions  Take over-the-counter and prescription medicines only as told by your health care provider.  If you have sleep apnea, surgery and certain medicines can increase your risk for breathing problems. Follow instructions from your health care provider about wearing  your sleep device: ? Anytime you are sleeping, including during daytime naps. ? While taking prescription pain medicines, sleeping medicines, or medicines that make you drowsy.  If you smoke, do not smoke without supervision.  Keep all follow-up visits as told by your health care provider. This is important. Contact a health care provider if:  You keep feeling nauseous or you keep vomiting.  You feel light-headed.  You develop a rash.  You have a fever. Get help right away if:  You have trouble breathing. Summary  For several hours after your procedure, you may feel sleepy and have poor judgment.  Have a responsible adult stay with you for at least 24 hours or until you are awake and alert. This information is not intended to replace advice given to you by your health care provider. Make sure you discuss any questions you have with your health care provider. Document Released: 08/09/2015 Document Revised: 07/17/2017 Document Reviewed: 08/09/2015 Elsevier Patient Education  2020 [employer].

## 2019-04-05 ENCOUNTER — Ambulatory Visit (HOSPITAL_COMMUNITY)
Admission: RE | Admit: 2019-04-05 | Discharge: 2019-04-05 | Disposition: A | Discharge status: Home / Self Care | Payer: BC Managed Care – PPO | Attending: Ophthalmology | Admitting: Ophthalmology

## 2019-04-05 ENCOUNTER — Encounter (HOSPITAL_COMMUNITY)
Admission: RE | Disposition: A | Discharge status: Home / Self Care | Payer: Self-pay | Source: Home / Self Care | Attending: Ophthalmology

## 2019-04-05 ENCOUNTER — Encounter (HOSPITAL_COMMUNITY): Payer: Self-pay | Admitting: *Deleted

## 2019-04-05 ENCOUNTER — Ambulatory Visit (HOSPITAL_COMMUNITY): Payer: BC Managed Care – PPO | Admitting: Anesthesiology

## 2019-04-05 DIAGNOSIS — E119 Type 2 diabetes mellitus without complications: Secondary | ICD-10-CM | POA: Diagnosis not present

## 2019-04-05 DIAGNOSIS — H25811 Combined forms of age-related cataract, right eye: Secondary | ICD-10-CM | POA: Insufficient documentation

## 2019-04-05 DIAGNOSIS — K219 Gastro-esophageal reflux disease without esophagitis: Secondary | ICD-10-CM | POA: Diagnosis not present

## 2019-04-05 DIAGNOSIS — H2511 Age-related nuclear cataract, right eye: Secondary | ICD-10-CM | POA: Diagnosis not present

## 2019-04-05 DIAGNOSIS — Z87891 Personal history of nicotine dependence: Secondary | ICD-10-CM | POA: Diagnosis not present

## 2019-04-05 DIAGNOSIS — Z6841 Body Mass Index (BMI) 40.0 and over, adult: Secondary | ICD-10-CM | POA: Diagnosis not present

## 2019-04-05 DIAGNOSIS — Z79899 Other long term (current) drug therapy: Secondary | ICD-10-CM | POA: Diagnosis not present

## 2019-04-05 DIAGNOSIS — Z7984 Long term (current) use of oral hypoglycemic drugs: Secondary | ICD-10-CM | POA: Insufficient documentation

## 2019-04-05 DIAGNOSIS — E1136 Type 2 diabetes mellitus with diabetic cataract: Secondary | ICD-10-CM | POA: Diagnosis not present

## 2019-04-05 DIAGNOSIS — I10 Essential (primary) hypertension: Secondary | ICD-10-CM | POA: Diagnosis not present

## 2019-04-05 DIAGNOSIS — Z7901 Long term (current) use of anticoagulants: Secondary | ICD-10-CM | POA: Insufficient documentation

## 2019-04-05 DIAGNOSIS — G4730 Sleep apnea, unspecified: Secondary | ICD-10-CM | POA: Diagnosis not present

## 2019-04-05 HISTORY — PX: CATARACT EXTRACTION W/PHACO: SHX586

## 2019-04-05 LAB — GLUCOSE, CAPILLARY: Glucose-Capillary: 96 mg/dL (ref 70–99)

## 2019-04-05 SURGERY — PHACOEMULSIFICATION, CATARACT, WITH IOL INSERTION
Anesthesia: Monitor Anesthesia Care | Site: Eye | Laterality: Right

## 2019-04-05 MED ORDER — MIDAZOLAM HCL 2 MG/2ML IJ SOLN
INTRAMUSCULAR | Status: DC | PRN
  Administered 2019-04-05: 2 mg via INTRAVENOUS

## 2019-04-05 MED ORDER — TETRACAINE HCL 0.5 % OP SOLN
1.0000 [drp] | OPHTHALMIC | Status: AC | PRN
  Administered 2019-04-05 (×3): 1 [drp] via OPHTHALMIC

## 2019-04-05 MED ORDER — HYDROMORPHONE HCL 1 MG/ML IJ SOLN: 0.2500 mg | INTRAMUSCULAR | Status: DC | PRN

## 2019-04-05 MED ORDER — ONDANSETRON HCL 4 MG/2ML IJ SOLN
INTRAMUSCULAR | Status: DC | PRN
  Administered 2019-04-05: 4 mg via INTRAVENOUS

## 2019-04-05 MED ORDER — PROVISC 10 MG/ML IO SOLN
INTRAOCULAR | Status: DC | PRN
  Administered 2019-04-05: 0.85 mL via INTRAOCULAR

## 2019-04-05 MED ORDER — CYCLOPENTOLATE-PHENYLEPHRINE 0.2-1 % OP SOLN
1.0000 [drp] | OPHTHALMIC | Status: AC | PRN
  Administered 2019-04-05 (×3): 1 [drp] via OPHTHALMIC

## 2019-04-05 MED ORDER — PHENYLEPHRINE HCL 2.5 % OP SOLN
1.0000 [drp] | OPHTHALMIC | Status: AC | PRN
  Administered 2019-04-05 (×3): 1 [drp] via OPHTHALMIC

## 2019-04-05 MED ORDER — MIDAZOLAM HCL 2 MG/2ML IJ SOLN
INTRAMUSCULAR | Status: AC
  Filled 2019-04-05: qty 2

## 2019-04-05 MED ORDER — BSS IO SOLN
INTRAOCULAR | Status: DC | PRN
  Administered 2019-04-05: 15 mL via INTRAOCULAR

## 2019-04-05 MED ORDER — LACTATED RINGERS IV SOLN: INTRAVENOUS | Status: DC

## 2019-04-05 MED ORDER — POVIDONE-IODINE 5 % OP SOLN
OPHTHALMIC | Status: DC | PRN
  Administered 2019-04-05: 1 via OPHTHALMIC

## 2019-04-05 MED ORDER — ONDANSETRON HCL 4 MG/2ML IJ SOLN
INTRAMUSCULAR | Status: AC
  Filled 2019-04-05: qty 2

## 2019-04-05 MED ORDER — SODIUM HYALURONATE 23 MG/ML IO SOLN
INTRAOCULAR | Status: DC | PRN
  Administered 2019-04-05: 0.6 mL via INTRAOCULAR

## 2019-04-05 MED ORDER — MIDAZOLAM HCL 2 MG/2ML IJ SOLN: 0.5000 mg | Freq: Once | INTRAMUSCULAR | Status: DC | PRN

## 2019-04-05 MED ORDER — HYDROCODONE-ACETAMINOPHEN 7.5-325 MG PO TABS: 1.0000 | ORAL_TABLET | Freq: Once | ORAL | Status: DC | PRN

## 2019-04-05 MED ORDER — PROMETHAZINE HCL 25 MG/ML IJ SOLN: 6.2500 mg | INTRAMUSCULAR | Status: DC | PRN

## 2019-04-05 MED ORDER — EPINEPHRINE PF 1 MG/ML IJ SOLN
INTRAMUSCULAR | Status: AC
  Filled 2019-04-05: qty 2

## 2019-04-05 MED ORDER — NEOMYCIN-POLYMYXIN-DEXAMETH 3.5-10000-0.1 OP SUSP
OPHTHALMIC | Status: DC | PRN
  Administered 2019-04-05: 1 [drp] via OPHTHALMIC

## 2019-04-05 MED ORDER — LIDOCAINE HCL 3.5 % OP GEL
1.0000 "application " | Freq: Once | OPHTHALMIC | Status: AC
  Administered 2019-04-05: 1 via OPHTHALMIC

## 2019-04-05 MED ORDER — LIDOCAINE HCL (PF) 1 % IJ SOLN
INTRAOCULAR | Status: DC | PRN
  Administered 2019-04-05: 1 mL via OPHTHALMIC

## 2019-04-05 MED ORDER — EPINEPHRINE PF 1 MG/ML IJ SOLN
INTRAOCULAR | Status: DC | PRN
  Administered 2019-04-05: 500 mL

## 2019-04-05 SURGICAL SUPPLY — 18 items
CLOTH BEACON ORANGE TIMEOUT ST (SAFETY) ×3 IMPLANT
DEVICE MILOOP (MISCELLANEOUS) IMPLANT
EYE SHIELD UNIVERSAL CLEAR (GAUZE/BANDAGES/DRESSINGS) ×3 IMPLANT
GLOVE BIOGEL PI IND STRL 6.5 (GLOVE) ×1 IMPLANT
GLOVE BIOGEL PI IND STRL 7.0 (GLOVE) ×1 IMPLANT
GLOVE BIOGEL PI INDICATOR 6.5 (GLOVE) ×2
GLOVE BIOGEL PI INDICATOR 7.0 (GLOVE) ×2
LENS ALC ACRYL/TECN (Ophthalmic Related) ×3 IMPLANT
MILOOP DEVICE (MISCELLANEOUS)
NEEDLE HYPO 18GX1.5 BLUNT FILL (NEEDLE) ×3 IMPLANT
PAD ARMBOARD 7.5X6 YLW CONV (MISCELLANEOUS) ×3 IMPLANT
RING MALYGIN (MISCELLANEOUS) IMPLANT
RING MALYGIN 7.0 (MISCELLANEOUS) IMPLANT
SYR TB 1ML LL NO SAFETY (SYRINGE) ×3 IMPLANT
TAPE SURG TRANSPORE 1 IN (GAUZE/BANDAGES/DRESSINGS) ×1 IMPLANT
TAPE SURGICAL TRANSPORE 1 IN (GAUZE/BANDAGES/DRESSINGS) ×2
VISCOELASTIC ADDITIONAL (OPHTHALMIC RELATED) ×3 IMPLANT
WATER STERILE IRR 250ML POUR (IV SOLUTION) ×3 IMPLANT

## 2019-04-05 NOTE — Op Note (Signed)
Date of procedure: 04/05/19  Pre-operative diagnosis:  Visually significant combined form age-related cataract, Right Eye (H25.811)  Post-operative diagnosis:  Visually significant combined form age-related cataract, Right Eye (H25.811)  Procedure: Removal of cataract via phacoemulsification and insertion of intra-ocular lens Alcon [id number] +20.5D into the capsular bag of the Right Eye  Attending surgeon: Gerda Diss. Rya Rausch, MD, MA  Anesthesia: MAC, Topical Akten  Complications: None  Estimated Blood Loss: <70m (minimal)  Specimens: None  Implants: As above  Indications:  Visually significant age-related cataract, Right Eye  Procedure:  The patient was seen and identified in the pre-operative area. The operative eye was identified and dilated.  The operative eye was marked.  Topical anesthesia was administered to the operative eye.     The patient was then to the operative suite and placed in the supine position.  A timeout was performed confirming the patient, procedure to be performed, and all other relevant information.   The patient's face was prepped and draped in the usual fashion for intra-ocular surgery.  A lid speculum was placed into the operative eye and the surgical microscope moved into place and focused.  A superotemporal paracentesis was created using a 20 gauge paracentesis blade.  Shugarcaine was injected into the anterior chamber.  Viscoelastic was injected into the anterior chamber.  A temporal clear-corneal main wound incision was created using a 2.459mmicrokeratome.  A continuous curvilinear capsulorrhexis was initiated using an irrigating cystitome and completed using capsulorrhexis forceps.  Hydrodissection and hydrodeliniation were performed.  Viscoelastic was injected into the anterior chamber.  A phacoemulsification handpiece and a chopper as a second instrument were used to remove the nucleus and epinucleus. The irrigation/aspiration handpiece was used to remove any  remaining cortical material.   The capsular bag was reinflated with viscoelastic, checked, and found to be intact.  The intraocular lens was inserted into the capsular bag.  The irrigation/aspiration handpiece was used to remove any remaining viscoelastic.  The clear corneal wound and paracentesis wounds were then hydrated and checked with Weck-Cels to be watertight.  The lid-speculum and drape was removed, and the patient's face was cleaned with a wet and dry 4x4.  Maxitrol was instilled in the eye before a clear shield was taped over the eye. The patient was taken to the post-operative care unit in good condition, having tolerated the procedure well.  Post-Op Instructions: [facility] and will receive all other orders and instructions.

## 2019-04-05 NOTE — Interval H&P Note (Signed)
History and Physical Interval Note: The H and P was reviewed and updated. The patient was examined.  No changes were found after exam.  The surgical eye was marked.  04/05/2019 9:52 AM  Monique Gomez  has presented today for surgery, with the diagnosis of Nuclear sclerotic cataract - Right eye.  The various methods of treatment have been discussed with the patient and family. After consideration of risks, benefits and other options for treatment, the patient has consented to  Procedure(s) with comments: CATARACT EXTRACTION PHACO AND INTRAOCULAR LENS PLACEMENT RIGHT EYE (Right) - right as a surgical intervention.  The patient's history has been reviewed, patient examined, no change in status, stable for surgery.  I have reviewed the patient's chart and labs.  Questions were answered to the patient's satisfaction.     Baruch Goldmann

## 2019-04-05 NOTE — Anesthesia Preprocedure Evaluation (Addendum)
Anesthesia Evaluation  Patient identified by MRN, date of birth, ID band Patient awake    Reviewed: Allergy & Precautions, NPO status , Patient's Chart, lab work & pertinent test results, reviewed documented beta blocker date and time   Airway Mallampati: II  TM Distance: >3 FB Neck ROM: Full    Dental no notable dental hx. (+) Teeth Intact   Pulmonary sleep apnea and Oxygen sleep apnea , former smoker,    Pulmonary exam normal breath sounds clear to auscultation       Cardiovascular Exercise Tolerance: Good hypertension, Pt. on medications and Pt. on home beta blockers + Past MI  Normal cardiovascular examI Rhythm:Regular Rate:Normal     Neuro/Psych  Headaches, Anxiety Depression  Neuromuscular disease negative psych ROS   GI/Hepatic Neg liver ROS, GERD  Medicated and Controlled,  Endo/Other  diabetes, Type 2Morbid obesityBMI>50 SMO  Renal/GU negative Renal ROS  negative genitourinary   Musculoskeletal negative musculoskeletal ROS (+)   Abdominal   Peds negative pediatric ROS (+)  Hematology negative hematology ROS (+)   Anesthesia Other Findings   Reproductive/Obstetrics negative OB ROS                            Anesthesia Physical Anesthesia Plan  ASA: IV  Anesthesia Plan: MAC   Post-op Pain Management:    Induction: Intravenous  PONV Risk Score and Plan: 2 and Propofol infusion and TIVA  Airway Management Planned: Simple Face Mask and Nasal Cannula  Additional Equipment:   Intra-op Plan:   Post-operative Plan:   Informed Consent: I have reviewed the patients History and Physical, chart, labs and discussed the procedure including the risks, benefits and alternatives for the proposed anesthesia with the patient or authorized representative who has indicated his/her understanding and acceptance.     Dental advisory given  Plan Discussed with: CRNA  Anesthesia Plan  Comments: (Plan Full PPE use  Plan MAC d/w pt -WTP with same after Q&A  Had MAC for previous IOL)        Anesthesia Quick Evaluation

## 2019-04-05 NOTE — Anesthesia Postprocedure Evaluation (Signed)
Anesthesia Post Note  Patient: Monique Gomez  Procedure(s) Performed: CATARACT EXTRACTION PHACO AND INTRAOCULAR LENS PLACEMENT RIGHT EYE (CDE: 3.19) (Right Eye)  Patient location during evaluation: Short Stay Anesthesia Type: MAC Level of consciousness: awake and alert and oriented Pain management: pain level controlled Vital Signs Assessment: post-procedure vital signs reviewed and stable Respiratory status: spontaneous breathing Cardiovascular status: blood pressure returned to baseline and stable Postop Assessment: no apparent nausea or vomiting Anesthetic complications: no     Last Vitals:  Vitals:   04/05/19 0914  BP: (!) 129/91  Resp: 20  Temp: 36.9 C  SpO2: 99%    Last Pain:  Vitals:   04/05/19 0914  TempSrc: Oral  PainSc: 0-No pain                 Laikynn Pollio

## 2019-04-05 NOTE — Discharge Instructions (Signed)
Please discharge patient when stable, will follow up today with Dr. Kylia Grajales at the [facility] office immediately following discharge.  Leave shield in place until visit.  All paperwork with discharge instructions will be given at the office. ° ° ° ° °Monitored Anesthesia Care, Care After °These instructions provide you with information about caring for yourself after your procedure. Your health care provider may also give you more specific instructions. Your treatment has been planned according to current medical practices, but problems sometimes occur. Call your health care provider if you have any problems or questions after your procedure. °What can I expect after the procedure? °After your procedure, you may: °· Feel sleepy for several hours. °· Feel clumsy and have poor balance for several hours. °· Feel forgetful about what happened after the procedure. °· Have poor judgment for several hours. °· Feel nauseous or vomit. °· Have a sore throat if you had a breathing tube during the procedure. °Follow these instructions at home: °For at least 24 hours after the procedure: ° °  ° °· Have a responsible adult stay with you. It is important to have someone help care for you until you are awake and alert. °· Rest as needed. °· Do not: °? Participate in activities in which you could fall or become injured. °? Drive. °? Use heavy machinery. °? Drink alcohol. °? Take sleeping pills or medicines that cause drowsiness. °? Make important decisions or sign legal documents. °? Take care of children on your own. °Eating and drinking °· Follow the diet that is recommended by your health care provider. °· If you vomit, drink water, juice, or soup when you can drink without vomiting. °· Make sure you have little or no nausea before eating solid foods. °General instructions °· Take over-the-counter and prescription medicines only as told by your health care provider. °· If you have sleep apnea, surgery and certain  medicines can increase your risk for breathing problems. Follow instructions from your health care provider about wearing your sleep device: °? Anytime you are sleeping, including during daytime naps. °? While taking prescription pain medicines, sleeping medicines, or medicines that make you drowsy. °· If you smoke, do not smoke without supervision. °· Keep all follow-up visits as told by your health care provider. This is important. °Contact a health care provider if: °· You keep feeling nauseous or you keep vomiting. °· You feel light-headed. °· You develop a rash. °· You have a fever. °Get help right away if: °· You have trouble breathing. °Summary °· For several hours after your procedure, you may feel sleepy and have poor judgment. °· Have a responsible adult stay with you for at least 24 hours or until you are awake and alert. °This information is not intended to replace advice given to you by your health care provider. Make sure you discuss any questions you have with your health care provider. °Document Released: 08/09/2015 Document Revised: 07/17/2017 Document Reviewed: 08/09/2015 °Elsevier Patient Education © 2020 [employer] ° °

## 2019-04-05 NOTE — Transfer of Care (Signed)
Immediate Anesthesia Transfer of Care Note  Patient: Monique Gomez  Procedure(s) Performed: CATARACT EXTRACTION PHACO AND INTRAOCULAR LENS PLACEMENT RIGHT EYE (CDE: 3.19) (Right Eye)  Patient Location: PACU  Anesthesia Type:MAC  Level of Consciousness: awake  Airway & Oxygen Therapy: Patient Spontanous Breathing  Post-op Assessment: Report given to RN  Post vital signs: Reviewed and stable  Last Vitals:  Vitals Value Taken Time  BP    Temp    Pulse    Resp    SpO2      Last Pain:  Vitals:   04/05/19 0914  TempSrc: Oral  PainSc: 0-No pain      Patients Stated Pain Goal: 7 (72/82/06 0156)  Complications: No apparent anesthesia complications

## 2019-04-08 ENCOUNTER — Encounter (HOSPITAL_COMMUNITY): Payer: Self-pay | Admitting: Ophthalmology

## 2019-04-17 ENCOUNTER — Encounter: Payer: Self-pay | Admitting: Family Medicine

## 2019-05-20 ENCOUNTER — Ambulatory Visit: Payer: BC Managed Care – PPO | Admitting: Orthopedic Surgery

## 2019-05-21 ENCOUNTER — Ambulatory Visit: Payer: BC Managed Care – PPO | Admitting: Orthopaedic Surgery

## 2019-05-21 ENCOUNTER — Encounter: Payer: BC Managed Care – PPO | Admitting: Family Medicine

## 2019-05-21 ENCOUNTER — Other Ambulatory Visit: Payer: Self-pay

## 2019-05-21 ENCOUNTER — Encounter: Payer: Self-pay | Admitting: Orthopaedic Surgery

## 2019-05-21 ENCOUNTER — Other Ambulatory Visit: Payer: Self-pay | Admitting: Family Medicine

## 2019-05-21 VITALS — BP 108/72 | HR 61 | Temp 96.6°F | Ht 62.0 in | Wt 280.4 lb

## 2019-05-21 DIAGNOSIS — I10 Essential (primary) hypertension: Secondary | ICD-10-CM

## 2019-05-21 DIAGNOSIS — M25512 Pain in left shoulder: Secondary | ICD-10-CM | POA: Diagnosis not present

## 2019-05-21 DIAGNOSIS — G8929 Other chronic pain: Secondary | ICD-10-CM

## 2019-05-21 DIAGNOSIS — Z6841 Body Mass Index (BMI) 40.0 and over, adult: Secondary | ICD-10-CM

## 2019-05-21 NOTE — Progress Notes (Signed)
PROCEDURE NOTE:  The patient request injection, verbal consent was obtained.  The left shoulder was prepped appropriately after time out was performed.   Sterile technique was observed and injection of 1 cc of Depo-Medrol 40 mg with several cc's of plain xylocaine. Anesthesia was provided by ethyl chloride and a 20-gauge needle was used to inject the shoulder area. A posterior approach was used.  The injection was tolerated well.  A band aid dressing was applied.  The patient was advised to apply ice later today and tomorrow to the injection sight as needed.  See as needed.  Electronically Signed Sanjuana Kava, MD 1/19/202110:13 AM

## 2019-05-30 ENCOUNTER — Telehealth: Payer: Self-pay | Admitting: *Deleted

## 2019-05-30 NOTE — Telephone Encounter (Signed)
Received call from patient.   Reports that lymph nodes continue to be swollen and neck pain hasnot improved.   States that she felt comforted at the time after last sonogram since they had shrunk, but is now concerned as they have not resolved.   Requested appointment to have PCP F/U. Appointment scheduled.

## 2019-05-31 NOTE — Telephone Encounter (Signed)
Noted  

## 2019-06-03 ENCOUNTER — Other Ambulatory Visit: Payer: Self-pay

## 2019-06-03 ENCOUNTER — Encounter: Payer: Self-pay | Admitting: Family Medicine

## 2019-06-03 ENCOUNTER — Ambulatory Visit: Payer: BC Managed Care – PPO | Admitting: Family Medicine

## 2019-06-03 VITALS — BP 110/78 | HR 84 | Temp 98.1°F | Resp 16 | Ht 62.0 in | Wt 281.0 lb

## 2019-06-03 DIAGNOSIS — I10 Essential (primary) hypertension: Secondary | ICD-10-CM | POA: Diagnosis not present

## 2019-06-03 DIAGNOSIS — N6320 Unspecified lump in the left breast, unspecified quadrant: Secondary | ICD-10-CM

## 2019-06-03 DIAGNOSIS — R590 Localized enlarged lymph nodes: Secondary | ICD-10-CM | POA: Diagnosis not present

## 2019-06-03 DIAGNOSIS — M542 Cervicalgia: Secondary | ICD-10-CM

## 2019-06-03 DIAGNOSIS — R252 Cramp and spasm: Secondary | ICD-10-CM

## 2019-06-03 DIAGNOSIS — E119 Type 2 diabetes mellitus without complications: Secondary | ICD-10-CM

## 2019-06-03 NOTE — Assessment & Plan Note (Signed)
Well-controlled no changes to medication she is very diligent about watching her diet and anything that may increase her blood pressure.

## 2019-06-03 NOTE — Assessment & Plan Note (Signed)
Recheck A1c.  She is currently on Metformin.

## 2019-06-03 NOTE — Patient Instructions (Addendum)
Mammogram to be scheduled CT of neck and xray of cervical spine to be done  F/U as previous

## 2019-06-03 NOTE — Progress Notes (Signed)
Subjective:    Patient ID: Monique Gomez, female    DOB: 05-20-1955, 64 y.o.   MRN: [id number]  Patient presents for Follow-up (Supraclavicular lymphadenopathy- states that she would like her lymph nodes checked again)  Patient here with continued concern over supraclavicular lymph node which was initially found back in September.  She had left-sided enlargement of lymph nodes in a near fashion.  She had initial ultrasound done was concerning therefore sent for CT soft tissue which showed 3 small supraclavicular lymph nodes the largest measuring about 8 to 9 mm and were palpable because of the close proximity.  They appear to be reactive adenitis.  She had repeat CT of neck done 4 weeks later to follow-up after being treated with antibiotics.  She does small lymph node in the right side of her neck which was noted as stable from previous imaging.  The cluster of lymph nodes on the left side was actually slightly smaller the largest was 5 mm still suggestive of a reactive lymphadenopathy.  Today she states Note labs from that time did not show any leukocytosis or leukopenia  Has neck pain worse on the left side near the lymph nodes. They also feel more prominent than before.  When she turns her neck she has discomfort but also tightness across the upper back and shoulders No recent illness  , soreness in breast well although this is bilateral.  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  Is also had some intermittent leg cramps.  She took mustard which helped Review Of Systems:  GEN- denies fatigue, fever, weight loss,weakness, recent illness HEENT- denies eye drainage, change in vision, nasal discharge, CVS- denies chest pain, palpitations RESP- denies SOB, cough, wheeze ABD- denies N/V, change in stools, abd pain GU- denies dysuria,  hematuria, dribbling, incontinence MSK- + joint pain, muscle aches, injury Neuro- denies headache, dizziness, syncope, seizure activity       Objective:    BP 110/78   Pulse 84   Temp 98.1 F (36.7 C) (Temporal)   Resp 16   Ht 5\' 2"  (1.575 m)   Wt 281 lb (127.5 kg)   SpO2 98%   BMI 51.40 kg/m  GEN- NAD, alert and oriented x3 HEENT- PERRL, EOMI, non injected sclera, pink conjunctiva, MMM, oropharynx clear, nares clear rhinorrhea, no sinus tenderness Nodes- no axillary nodes, no submandibular nodes palpated  Neck- Supple, no thyromegaly, left supraclavicular nodes not easily palpated, NT,   FROM neck, pain with rotation of neck bilat L >R , TTP across trapezius, C spine NT CVS- RRR, no murmur RESP-CTAB         Assessment & Plan:      Problem List Items Addressed This Visit      Unprioritized   Diabetes mellitus ([place])    Recheck A1c.  She is currently on Metformin.      Relevant Orders   Comprehensive metabolic panel   Hemoglobin A1c   Essential hypertension, benign    Well-controlled no changes to medication she is very diligent about watching her diet and anything that may increase her blood pressure.       Other Visit Diagnoses    Supraclavicular lymphadenopathy    -  Primary   Will repeat CT for the lymph nodes to see if there is any enlargement contributing to the  tightness across her neck. If pathologic will need biopsy of the nodes    Relevant Orders   CT Soft Tissue Neck W Contrast   Muscle cramps       He also complained of muscle cramps we will check her metabolic panel.  She took mustard the other day which helped with her leg cramps   Relevant Orders   Comprehensive metabolic panel   Magnesium   Left breast mass       Relevant Orders   MM Digital Diagnostic Unilat L   Neck pain       Relevant Orders   DG Cervical Spine Complete      Note: This dictation was prepared with Dragon dictation along with smaller phrase technology. Any  transcriptional errors that result from this process are unintentional.

## 2019-06-04 LAB — HEMOGLOBIN A1C
Hgb A1c MFr Bld: 6.5 % of total Hgb — ABNORMAL HIGH (ref <5.7)
Mean Plasma Glucose: 140 (calc)
eAG (mmol/L): 7.7 (calc)

## 2019-06-04 LAB — COMPREHENSIVE METABOLIC PANEL
AG Ratio: 1.2 (calc) (ref 1.0–2.5)
ALT: 8 U/L (ref 6–29)
AST: 9 U/L — ABNORMAL LOW (ref 10–35)
Albumin: 4 g/dL (ref 3.6–5.1)
Alkaline phosphatase (APISO): 80 U/L (ref 37–153)
BUN: 21 mg/dL (ref 7–25)
CO2: 24 mmol/L (ref 20–32)
Calcium: 9.8 mg/dL (ref 8.6–10.4)
Chloride: 109 mmol/L (ref 98–110)
Creat: 0.84 mg/dL (ref 0.50–0.99)
Globulin: 3.3 g/dL (calc) (ref 1.9–3.7)
Glucose, Bld: 103 mg/dL — ABNORMAL HIGH (ref 65–99)
Potassium: 3.9 mmol/L (ref 3.5–5.3)
Sodium: 141 mmol/L (ref 135–146)
Total Bilirubin: 0.2 mg/dL (ref 0.2–1.2)
Total Protein: 7.3 g/dL (ref 6.1–8.1)

## 2019-06-04 LAB — MAGNESIUM: Magnesium: 1.6 mg/dL (ref 1.5–2.5)

## 2019-06-05 ENCOUNTER — Other Ambulatory Visit: Payer: Self-pay

## 2019-06-05 ENCOUNTER — Ambulatory Visit: Payer: BC Managed Care – PPO | Admitting: Orthopedic Surgery

## 2019-06-05 VITALS — BP 121/72 | HR 68 | Temp 97.2°F | Ht 62.0 in | Wt 276.0 lb

## 2019-06-05 DIAGNOSIS — M542 Cervicalgia: Secondary | ICD-10-CM | POA: Diagnosis not present

## 2019-06-05 DIAGNOSIS — M25531 Pain in right wrist: Secondary | ICD-10-CM | POA: Diagnosis not present

## 2019-06-05 DIAGNOSIS — M659 Synovitis and tenosynovitis, unspecified: Secondary | ICD-10-CM | POA: Diagnosis not present

## 2019-06-05 DIAGNOSIS — Z6841 Body Mass Index (BMI) 40.0 and over, adult: Secondary | ICD-10-CM

## 2019-06-05 NOTE — Progress Notes (Signed)
Progress Note   Patient ID: Monique Gomez, female   DOB: 1955-09-09, 64 y.o.   MRN: [id number]  Body mass index is 50.48 kg/m.  Chief Complaint  Patient presents with  . Follow-up    Recheck on right wrist.    Encounter Diagnoses  Name Primary?  . Body mass index 50.0-59.9, adult ([place]) Yes  . Morbid obesity ([place])   . Right wrist pain   . Synovitis of wrist   . Cervical pain (neck)     Monique Gomez is 64 years old she has chronic de Quervain's syndrome usually responds well to injection comes in with pain over the right wrist pain with typing and pain with ulnar deviation  She also has new onset pain in her cervical spine with pain on the right and left side primarily in the para spinous musculature    ROS  Neck pain bilaterally    BP 121/72   Pulse 68   Temp (!) 97.2 F (36.2 C)   Ht 5\' 2"  (1.575 m)   Wt 276 lb (125.2 kg)   BMI 50.48 kg/m   Physical Exam  Examination shows tenderness over the first extensor compartment with painful ulnar deviation grip strength is normal neurovascular exam intact skin shows a scar from a previous injury unrelated  She has tenderness in the right and left side of the paraspinous musculature but no midline tenderness she had full range of motion with negative Spurling's test in the cervical spine   MEDICAL DECISION MAKING  A.  Encounter Diagnoses  Name Primary?  . Body mass index 50.0-59.9, adult ([place]) Yes  . Morbid obesity ([place])   . Right wrist pain   . Synovitis of wrist   . Cervical pain (neck)     B. DATA ANALYSED:  C. MANAGEMENT  Injection right wrist Recommend tizanidine and heat for her cervical spine Procedure note injection RIGHT wrist for de Quervain's syndrome  The patient has consented for injection of THE RIGHT wrist for de Quervain's syndrome 40 mg of Depo-Medrol 1 mL, 3 mL 1% lidocaine. Patient gave verbal consent timeout to confirm site of injection  Sterile technique ethyl chloride used for skin  prep  No complications  No orders of the defined types were placed in this encounter.   Arther Abbott, MD 06/05/2019 5:04 PM

## 2019-06-05 NOTE — Patient Instructions (Addendum)
You have received an injection of steroids into the joint. 15% of patients will have increased pain within the 24 hours postinjection.   This is transient and will go away.   We recommend that you use ice packs on the injection site for 20 minutes every 2 hours and extra strength Tylenol 2 tablets every 8 as needed until the pain resolves.  If you continue to have pain after taking the Tylenol and using the ice please call the office for further instructions.   Stress: use tizanidine and heat on the neck    Calorie Counting for Weight Loss Calories are units of energy. Your body needs a certain amount of calories from food to keep you going throughout the day. When you eat more calories than your body needs, your body stores the extra calories as fat. When you eat fewer calories than your body needs, your body burns fat to get the energy it needs. Calorie counting means keeping track of how many calories you eat and drink each day. Calorie counting can be helpful if you need to lose weight. If you make sure to eat fewer calories than your body needs, you should lose weight. Ask your health care provider what a healthy weight is for you. For calorie counting to work, you will need to eat the right number of calories in a day in order to lose a healthy amount of weight per week. A dietitian can help you determine how many calories you need in a day and will give you suggestions on how to reach your calorie goal.  A healthy amount of weight to lose per week is usually 1-2 lb (0.5-0.9 kg). This usually means that your daily calorie intake should be reduced by 500-750 calories.  Eating 1,200 - 1,500 calories per day can help most women lose weight.  Eating 1,500 - 1,800 calories per day can help most men lose weight. What is my plan? My goal is to have __________ calories per day. If I have this many calories per day, I should lose around __________ pounds per week. What do I need to know about  calorie counting? In order to meet your daily calorie goal, you will need to:  Find out how many calories are in each food you would like to eat. Try to do this before you eat.  Decide how much of the food you plan to eat.  Write down what you ate and how many calories it had. Doing this is called keeping a food log. To successfully lose weight, it is important to balance calorie counting with a healthy lifestyle that includes regular activity. Aim for 150 minutes of moderate exercise (such as walking) or 75 minutes of vigorous exercise (such as running) each week. Where do I find calorie information?  The number of calories in a food can be found on a Nutrition Facts label. If a food does not have a Nutrition Facts label, try to look up the calories online or ask your dietitian for help. Remember that calories are listed per serving. If you choose to have more than one serving of a food, you will have to multiply the calories per serving by the amount of servings you plan to eat. For example, the label on a package of bread might say that a serving size is 1 slice and that there are 90 calories in a serving. If you eat 1 slice, you will have eaten 90 calories. If you eat 2 slices, you will  have eaten 180 calories. How do I keep a food log? Immediately after each meal, record the following information in your food log:  What you ate. Don't forget to include toppings, sauces, and other extras on the food.  How much you ate. This can be measured in cups, ounces, or number of items.  How many calories each food and drink had.  The total number of calories in the meal. Keep your food log near you, such as in a small notebook in your pocket, or use a mobile app or website. Some programs will calculate calories for you and show you how many calories you have left for the day to meet your goal. What are some calorie counting tips?   Use your calories on foods and drinks that will fill you up and  not leave you hungry: ? Some examples of foods that fill you up are nuts and nut butters, vegetables, lean proteins, and high-fiber foods like whole grains. High-fiber foods are foods with more than 5 g fiber per serving. ? Drinks such as sodas, specialty coffee drinks, alcohol, and juices have a lot of calories, yet do not fill you up.  Eat nutritious foods and avoid empty calories. Empty calories are calories you get from foods or beverages that do not have many vitamins or protein, such as candy, sweets, and soda. It is better to have a nutritious high-calorie food (such as an avocado) than a food with few nutrients (such as a bag of chips).  Know how many calories are in the foods you eat most often. This will help you calculate calorie counts faster.  Pay attention to calories in drinks. Low-calorie drinks include water and unsweetened drinks.  Pay attention to nutrition labels for "low fat" or "fat free" foods. These foods sometimes have the same amount of calories or more calories than the full fat versions. They also often have added sugar, starch, or salt, to make up for flavor that was removed with the fat.  Find a way of tracking calories that works for you. Get creative. Try different apps or programs if writing down calories does not work for you. What are some portion control tips?  Know how many calories are in a serving. This will help you know how many servings of a certain food you can have.  Use a measuring cup to measure serving sizes. You could also try weighing out portions on a kitchen scale. With time, you will be able to estimate serving sizes for some foods.  Take some time to put servings of different foods on your favorite plates, bowls, and cups so you know what a serving looks like.  Try not to eat straight from a bag or box. Doing this can lead to overeating. Put the amount you would like to eat in a cup or on a plate to make sure you are eating the right  portion.  Use smaller plates, glasses, and bowls to prevent overeating.  Try not to multitask (for example, watch TV or use your computer) while eating. If it is time to eat, sit down at a table and enjoy your food. This will help you to know when you are full. It will also help you to be aware of what you are eating and how much you are eating. What are tips for following this plan? Reading food labels  Check the calorie count compared to the serving size. The serving size may be smaller than what you are used  to eating.  Check the source of the calories. Make sure the food you are eating is high in vitamins and protein and low in saturated and trans fats. Shopping  Read nutrition labels while you shop. This will help you make healthy decisions before you decide to purchase your food.  Make a grocery list and stick to it. Cooking  Try to cook your favorite foods in a healthier way. For example, try baking instead of frying.  Use low-fat dairy products. Meal planning  Use more fruits and vegetables. Half of your plate should be fruits and vegetables.  Include lean proteins like poultry and fish. How do I count calories when eating out?  Ask for smaller portion sizes.  Consider sharing an entree and sides instead of getting your own entree.  If you get your own entree, eat only half. Ask for a box at the beginning of your meal and put the rest of your entree in it so you are not tempted to eat it.  If calories are listed on the menu, choose the lower calorie options.  Choose dishes that include vegetables, fruits, whole grains, low-fat dairy products, and lean protein.  Choose items that are boiled, broiled, grilled, or steamed. Stay away from items that are buttered, battered, fried, or served with cream sauce. Items labeled "crispy" are usually fried, unless stated otherwise.  Choose water, low-fat milk, unsweetened iced tea, or other drinks without added sugar. If you want  an alcoholic beverage, choose a lower calorie option such as a glass of wine or light beer.  Ask for dressings, sauces, and syrups on the side. These are usually high in calories, so you should limit the amount you eat.  If you want a salad, choose a garden salad and ask for grilled meats. Avoid extra toppings like bacon, cheese, or fried items. Ask for the dressing on the side, or ask for olive oil and vinegar or lemon to use as dressing.  Estimate how many servings of a food you are given. For example, a serving of cooked rice is  cup or about the size of half a baseball. Knowing serving sizes will help you be aware of how much food you are eating at restaurants. The list below tells you how big or small some common portion sizes are based on everyday objects: ? 1 oz-4 stacked dice. ? 3 oz-1 deck of cards. ? 1 tsp-1 die. ? 1 Tbsp- a ping-pong ball. ? 2 Tbsp-1 ping-pong ball. ?  cup- baseball. ? 1 cup-1 baseball. Summary  Calorie counting means keeping track of how many calories you eat and drink each day. If you eat fewer calories than your body needs, you should lose weight.  A healthy amount of weight to lose per week is usually 1-2 lb (0.5-0.9 kg). This usually means reducing your daily calorie intake by 500-750 calories.  The number of calories in a food can be found on a Nutrition Facts label. If a food does not have a Nutrition Facts label, try to look up the calories online or ask your dietitian for help.  Use your calories on foods and drinks that will fill you up, and not on foods and drinks that will leave you hungry.  Use smaller plates, glasses, and bowls to prevent overeating. This information is not intended to replace advice given to you by your health care provider. Make sure you discuss any questions you have with your health care provider. Document Revised: 01/05/2018 Document Reviewed: 03/18/2016  Elsevier Patient Education  [employer].

## 2019-06-11 ENCOUNTER — Encounter: Payer: Self-pay | Admitting: Family Medicine

## 2019-06-12 ENCOUNTER — Other Ambulatory Visit (HOSPITAL_COMMUNITY): Payer: Self-pay | Admitting: Family Medicine

## 2019-06-12 DIAGNOSIS — N63 Unspecified lump in unspecified breast: Secondary | ICD-10-CM

## 2019-06-17 ENCOUNTER — Ambulatory Visit (HOSPITAL_COMMUNITY): Payer: BC Managed Care – PPO

## 2019-06-17 ENCOUNTER — Ambulatory Visit (HOSPITAL_COMMUNITY)
Admission: RE | Admit: 2019-06-17 | Discharge: 2019-06-17 | Disposition: A | Discharge status: Home / Self Care | Payer: BC Managed Care – PPO | Source: Ambulatory Visit | Attending: Family Medicine | Admitting: Family Medicine

## 2019-06-17 ENCOUNTER — Other Ambulatory Visit: Payer: Self-pay

## 2019-06-17 ENCOUNTER — Other Ambulatory Visit (HOSPITAL_COMMUNITY): Payer: Self-pay | Admitting: Family Medicine

## 2019-06-17 DIAGNOSIS — M542 Cervicalgia: Secondary | ICD-10-CM

## 2019-06-17 DIAGNOSIS — R590 Localized enlarged lymph nodes: Secondary | ICD-10-CM | POA: Insufficient documentation

## 2019-06-17 MED ORDER — IOHEXOL 300 MG/ML  SOLN
75.0000 mL | Freq: Once | INTRAMUSCULAR | Status: AC | PRN
  Administered 2019-06-17: 75 mL via INTRAVENOUS

## 2019-06-18 ENCOUNTER — Ambulatory Visit (INDEPENDENT_AMBULATORY_CARE_PROVIDER_SITE_OTHER): Payer: BC Managed Care – PPO | Admitting: Obstetrics & Gynecology

## 2019-06-18 ENCOUNTER — Other Ambulatory Visit (HOSPITAL_COMMUNITY)
Admission: RE | Admit: 2019-06-18 | Discharge: 2019-06-18 | Disposition: A | Discharge status: Home / Self Care | Payer: BC Managed Care – PPO | Source: Ambulatory Visit | Attending: Obstetrics & Gynecology | Admitting: Obstetrics & Gynecology

## 2019-06-18 ENCOUNTER — Other Ambulatory Visit: Payer: Self-pay

## 2019-06-18 ENCOUNTER — Encounter: Payer: Self-pay | Admitting: Obstetrics & Gynecology

## 2019-06-18 VITALS — BP 155/88 | HR 62 | Ht 62.0 in | Wt 282.0 lb

## 2019-06-18 DIAGNOSIS — Z01419 Encounter for gynecological examination (general) (routine) without abnormal findings: Secondary | ICD-10-CM

## 2019-06-18 DIAGNOSIS — Z1212 Encounter for screening for malignant neoplasm of rectum: Secondary | ICD-10-CM

## 2019-06-18 DIAGNOSIS — Z1211 Encounter for screening for malignant neoplasm of colon: Secondary | ICD-10-CM

## 2019-06-18 NOTE — Progress Notes (Signed)
Subjective:     Monique Gomez is a 64 y.o. female here for a routine exam.  No LMP recorded. Patient is postmenopausal. No obstetric history on file. Birth Control Method:  menopause Menstrual Calendar(currently): amenorrheic  Current complaints: breast tenderness.   Current acute medical issues:  hypertension   Recent Gynecologic History No LMP recorded. Patient is postmenopausal. Last Pap: 2018,  abnormal Last mammogram: following ,  Due to density  Past Medical History:  Diagnosis Date  . Allergic rhinitis   . Anemia   . Essential hypertension   . GERD (gastroesophageal reflux disease)   . Headache(784.0)   . History of cardiac catheterization    No significant CAD May 2015  . History of migraine headaches   . Hyperlipidemia   . Obesity   . PAF (paroxysmal atrial fibrillation) ([place]) 08/2013  . Type 2 diabetes mellitus ([place])     Past Surgical History:  Procedure Laterality Date  . CATARACT EXTRACTION    . CATARACT EXTRACTION W/PHACO Right 04/05/2019   Procedure: CATARACT EXTRACTION PHACO AND INTRAOCULAR LENS PLACEMENT RIGHT EYE (CDE: 3.19);  Surgeon: Baruch Goldmann, MD;  Location: [facility];  Service: Ophthalmology;  Laterality: Right;  . COLONOSCOPY N/A 02/02/2015   SLF: 1. one colon polyp removed-no source for anemia identified. 2. moderate diverticulosis noted in the sigmoid colon and descending colon 3. the left colon is redundant 4. Rectal bleeding due ot small internal hemorroids 5. Moderate sized external hemorrhoids.  . ESOPHAGOGASTRODUODENOSCOPY N/A 02/02/2015   SLF: 1. 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 large hiatal hernia 3. mild non-erosive gastritis 4. No source for anemia identified.   Marland Kitchen LEFT HEART CATHETERIZATION WITH CORONARY ANGIOGRAM N/A 09/03/2013   Procedure: LEFT HEART CATHETERIZATION WITH CORONARY ANGIOGRAM;  Surgeon: Jettie Booze, MD;  Location: [facility] C[facility];  Service: Cardiovascular;  Laterality: N/A;  . Right carpal tunnel release       OB History   No obstetric history on file.     Social History   Socioeconomic History  . Marital status: Single    Spouse name: Not on file  . Number of children: Not on file  . Years of education: Not on file  . Highest education level: Not on file  Occupational History  . Occupation: Training and development officer: [employer]  Tobacco Use  . Smoking status: Former Smoker    Packs/day: 0.30    Types: Cigarettes    Quit date: 04/01/2012    Years since quitting: 7.2  . Smokeless tobacco: Never Used  Substance and Sexual Activity  . Alcohol use: Not Currently    Alcohol/week: 0.0 standard drinks    Comment: Wine typically 2-3 glasses every few weekends  . Drug use: No  . Sexual activity: Yes  Other Topics Concern  . Not on file  Social History Narrative  . Not on file   Social Determinants of Health   Financial Resource Strain:   . Difficulty of Paying Living Expenses: Not on file  Food Insecurity:   . Worried About Charity fundraiser in the Last Year: Not on file  . Ran Out of Food in the Last Year: Not on file  Transportation Needs:   . Lack of Transportation (Medical): Not on file  . Lack of Transportation (Non-Medical): Not on file  Physical Activity:   . Days of Exercise per Week: Not on file  . Minutes of Exercise per Session: Not on file  Stress:   .  Feeling of Stress : Not on file  Social Connections:   . Frequency of Communication with Friends and Family: Not on file  . Frequency of Social Gatherings with Friends and Family: Not on file  . Attends Religious Services: Not on file  . Active Member of Clubs or Organizations: Not on file  . Attends Archivist Meetings: Not on file  . Marital Status: Not on file    Family History  Problem Relation Age of Onset  . Diabetes Mother   . Hypertension Mother   . Heart disease Mother   . Hypertension Brother   . Colon cancer Neg Hx      Current Outpatient Medications:  .   acetaminophen (TYLENOL) 325 MG tablet, Take 650 mg by mouth every 6 (six) hours as needed for moderate pain., Disp: , Rfl:  .  AIMOVIG 70 MG/ML SOAJ, Inject 70 mg into the skin every 30 (thirty) days., Disp: 1 pen, Rfl: 11 .  apixaban (ELIQUIS) 5 MG TABS tablet, Take 1 tablet (5 mg total) by mouth 2 (two) times daily., Disp: 180 tablet, Rfl: 3 .  atenolol (TENORMIN) 25 MG tablet, Take 1 tablet (25 mg total) by mouth daily., Disp: 90 tablet, Rfl: 3 .  atorvastatin (LIPITOR) 80 MG tablet, Take 1 tablet (80 mg total) by mouth daily., Disp: 90 tablet, Rfl: 3 .  fluticasone (FLONASE) 50 MCG/ACT nasal spray, Place 2 sprays into both nostrils daily., Disp: , Rfl:  .  guaiFENesin (MUCINEX) 600 MG 12 hr tablet, Take 600 mg by mouth every other day., Disp: , Rfl:  .  Iron-FA-B Cmp-C-Biot-Probiotic (FUSION PLUS) CAPS, Take 2 tablets by mouth daily., Disp: 60 capsule, Rfl: 3 .  lisinopril (ZESTRIL) 20 MG tablet, Take 1 tablet by mouth once daily, Disp: 90 tablet, Rfl: 0 .  metFORMIN (GLUCOPHAGE) 500 MG tablet, Take 1 tablet (500 mg total) by mouth 2 (two) times daily with a meal., Disp: 180 tablet, Rfl: 3 .  metoCLOPramide (REGLAN) 10 MG tablet, Take 1 tablet (10 mg total) by mouth every 8 (eight) hours as needed for up to 7 days for nausea (Headache)., Disp: 21 tablet, Rfl: 0 .  ondansetron (ZOFRAN ODT) 4 MG disintegrating tablet, Take 1 tablet (4 mg total) by mouth every 8 (eight) hours as needed. (Patient taking differently: Take 4 mg by mouth every 8 (eight) hours as needed for nausea or vomiting. ), Disp: 20 tablet, Rfl: 6 .  pantoprazole (PROTONIX) 40 MG tablet, Take 1 tablet by mouth twice daily, Disp: 180 tablet, Rfl: 0 .  SUMAtriptan (IMITREX) 6 MG/0.5ML SOLN injection, Inject 0.5 mLs (6 mg total) into the skin every 2 (two) hours as needed for migraine or headache. May repeat in 2 hours if headache persists or recurs., Disp: 6 mL, Rfl: 6 .  tiZANidine (ZANAFLEX) 4 MG tablet, Take 1 tablet (4 mg total) by  mouth every 6 (six) hours as needed for muscle spasms., Disp: 30 tablet, Rfl: 0 .  topiramate (TOPAMAX) 50 MG tablet, Take 1 tablet at bedtime (Patient taking differently: Take 50 mg by mouth at bedtime. ), Disp: 90 tablet, Rfl: 3  Review of Systems  Review of Systems  Constitutional: Negative for fever, chills, weight loss, malaise/fatigue and diaphoresis.  HENT: Negative for hearing loss, ear pain, nosebleeds, congestion, sore throat, neck pain, tinnitus and ear discharge.   Eyes: Negative for blurred vision, double vision, photophobia, pain, discharge and redness.  Respiratory: Negative for cough, hemoptysis, sputum production, shortness of breath, wheezing  and stridor.   Cardiovascular: Negative for chest pain, palpitations, orthopnea, claudication, leg swelling and PND.  Gastrointestinal: negative for abdominal pain. Negative for heartburn, nausea, vomiting, diarrhea, constipation, blood in stool and melena.  Genitourinary: Negative for dysuria, urgency, frequency, hematuria and flank pain.  Musculoskeletal: Negative for myalgias, back pain, joint pain and falls.  Skin: Negative for itching and rash.  Neurological: Negative for dizziness, tingling, tremors, sensory change, speech change, focal weakness, seizures, loss of consciousness, weakness and headaches.  Endo/Heme/Allergies: Negative for environmental allergies and polydipsia. Does not bruise/bleed easily.  Psychiatric/Behavioral: Negative for depression, suicidal ideas, hallucinations, memory loss and substance abuse. The patient is not nervous/anxious and does not have insomnia.        Objective:  Blood pressure (!) 155/88, pulse 62, height 5\' 2"  (1.575 m), weight 282 lb (127.9 kg).   Physical Exam  Vitals reviewed. Constitutional: She is oriented to person, place, and time. She appears well-developed and well-nourished.  HENT:  Head: Normocephalic and atraumatic.        Right Ear: External ear normal.  Left Ear: External  ear normal.  Nose: Nose normal.  Mouth/Throat: Oropharynx is clear and moist.  Eyes: Conjunctivae and EOM are normal. Pupils are equal, round, and reactive to light. Right eye exhibits no discharge. Left eye exhibits no discharge. No scleral icterus.  Neck: Normal range of motion. Neck supple. No tracheal deviation present. No thyromegaly present.  Cardiovascular: Normal rate, regular rhythm, normal heart sounds and intact distal pulses.  Exam reveals no gallop and no friction rub.   No murmur heard. Respiratory: Effort normal and breath sounds normal. No respiratory distress. She has no wheezes. She has no rales. She exhibits no tenderness.  GI: Soft. Bowel sounds are normal. She exhibits no distension and no mass. There is no tenderness. There is no rebound and no guarding.  Genitourinary:  Breasts no masses skin changes or nipple changes bilaterally      Vulva is normal without lesions Vagina is pink moist without discharge Cervix normal in appearance and pap is done Uterus is normal size shape and contour Adnexa is negative with normal sized ovaries  {Rectal    hemoccult negative, normal tone, no masses  Musculoskeletal: Normal range of motion. She exhibits no edema and no tenderness.  Neurological: She is alert and oriented to person, place, and time. She has normal reflexes. She displays normal reflexes. No cranial nerve deficit. She exhibits normal muscle tone. Coordination normal.  Skin: Skin is warm and dry. No rash noted. No erythema. No pallor.  Psychiatric: She has a normal mood and affect. Her behavior is normal. Judgment and thought content normal.       Medications Ordered at today's visit: No orders of the defined types were placed in this encounter.   Other orders placed at today's visit: No orders of the defined types were placed in this encounter.     Assessment:    Healthy female exam.   costochondritis Plan:    Mammogram ordered. Follow up in: 3 years.      Return in about 3 years (around 06/17/2022) for yearly.

## 2019-06-20 LAB — CYTOLOGY - PAP
Chlamydia: NEGATIVE
Comment: NEGATIVE
Comment: NEGATIVE
Comment: NORMAL
Diagnosis: NEGATIVE
High risk HPV: NEGATIVE
Neisseria Gonorrhea: NEGATIVE

## 2019-06-24 ENCOUNTER — Ambulatory Visit: Payer: BC Managed Care – PPO | Attending: Internal Medicine

## 2019-06-24 DIAGNOSIS — Z23 Encounter for immunization: Secondary | ICD-10-CM | POA: Insufficient documentation

## 2019-06-24 NOTE — Progress Notes (Signed)
   Covid-19 Vaccination [facility]  Name:  Monique Gomez    MRN: [id number] DOB: December 26, 1955  06/24/2019  [name] was observed post Covid-19 immunization for 15 minutes without incidence. She was provided with Vaccine Information Sheet and instruction to access the V-Safe system.   [name] was instructed to call 911 with any severe reactions post vaccine: Marland Kitchen Difficulty breathing  . Swelling of your face and throat  . A fast heartbeat  . A bad rash all over your body  . Dizziness and weakness    Immunizations Administered    Name Date Dose VIS Date Route   Moderna COVID-19 Vaccine 06/24/2019  1:44 PM 0.5 mL 04/02/2019 Intramuscular   Manufacturer: Moderna   Lot: [id number]   [id number]

## 2019-07-01 ENCOUNTER — Other Ambulatory Visit: Payer: Self-pay | Admitting: Family Medicine

## 2019-07-01 DIAGNOSIS — K219 Gastro-esophageal reflux disease without esophagitis: Secondary | ICD-10-CM

## 2019-07-02 ENCOUNTER — Ambulatory Visit (HOSPITAL_COMMUNITY)
Admission: RE | Admit: 2019-07-02 | Discharge: 2019-07-02 | Disposition: A | Discharge status: Home / Self Care | Payer: BC Managed Care – PPO | Source: Ambulatory Visit | Attending: Family Medicine | Admitting: Family Medicine

## 2019-07-02 ENCOUNTER — Ambulatory Visit (HOSPITAL_COMMUNITY): Admission: RE | Admit: 2019-07-02 | Payer: BC Managed Care – PPO | Source: Ambulatory Visit

## 2019-07-02 ENCOUNTER — Other Ambulatory Visit: Payer: Self-pay

## 2019-07-02 DIAGNOSIS — N6320 Unspecified lump in the left breast, unspecified quadrant: Secondary | ICD-10-CM

## 2019-07-02 DIAGNOSIS — R928 Other abnormal and inconclusive findings on diagnostic imaging of breast: Secondary | ICD-10-CM | POA: Diagnosis not present

## 2019-07-08 ENCOUNTER — Encounter (INDEPENDENT_AMBULATORY_CARE_PROVIDER_SITE_OTHER): Payer: Self-pay

## 2019-07-17 ENCOUNTER — Ambulatory Visit (INDEPENDENT_AMBULATORY_CARE_PROVIDER_SITE_OTHER): Payer: BC Managed Care – PPO | Admitting: Bariatrics

## 2019-07-17 ENCOUNTER — Encounter (INDEPENDENT_AMBULATORY_CARE_PROVIDER_SITE_OTHER): Payer: Self-pay | Admitting: Bariatrics

## 2019-07-17 ENCOUNTER — Other Ambulatory Visit: Payer: Self-pay

## 2019-07-17 VITALS — BP 127/85 | HR 72 | Temp 97.8°F | Ht 63.0 in | Wt 281.0 lb

## 2019-07-17 DIAGNOSIS — Z1331 Encounter for screening for depression: Secondary | ICD-10-CM

## 2019-07-17 DIAGNOSIS — Z9189 Other specified personal risk factors, not elsewhere classified: Secondary | ICD-10-CM | POA: Diagnosis not present

## 2019-07-17 DIAGNOSIS — IMO0002 Reserved for concepts with insufficient information to code with codable children: Secondary | ICD-10-CM

## 2019-07-17 DIAGNOSIS — E119 Type 2 diabetes mellitus without complications: Secondary | ICD-10-CM | POA: Diagnosis not present

## 2019-07-17 DIAGNOSIS — D508 Other iron deficiency anemias: Secondary | ICD-10-CM | POA: Diagnosis not present

## 2019-07-17 DIAGNOSIS — I10 Essential (primary) hypertension: Secondary | ICD-10-CM

## 2019-07-17 DIAGNOSIS — R5383 Other fatigue: Secondary | ICD-10-CM | POA: Diagnosis not present

## 2019-07-17 DIAGNOSIS — G4733 Obstructive sleep apnea (adult) (pediatric): Secondary | ICD-10-CM | POA: Diagnosis not present

## 2019-07-17 DIAGNOSIS — Z6841 Body Mass Index (BMI) 40.0 and over, adult: Secondary | ICD-10-CM

## 2019-07-17 DIAGNOSIS — R0602 Shortness of breath: Secondary | ICD-10-CM

## 2019-07-17 DIAGNOSIS — Z0289 Encounter for other administrative examinations: Secondary | ICD-10-CM

## 2019-07-17 DIAGNOSIS — G43709 Chronic migraine without aura, not intractable, without status migrainosus: Secondary | ICD-10-CM

## 2019-07-17 DIAGNOSIS — I4891 Unspecified atrial fibrillation: Secondary | ICD-10-CM

## 2019-07-17 NOTE — Progress Notes (Signed)
Chief Complaint:   OBESITY Monique Gomez (MR# [id number]) is a 64 y.o. female who presents for evaluation and treatment of obesity and related comorbidities. Current BMI is Body mass index is 49.78 kg/m.Marland Kitchen Monique Gomez has been struggling with her weight for many years and has been unsuccessful in either losing weight, maintaining weight loss, or reaching her healthy weight goal.  Monique Gomez is currently in the action stage of change and ready to dedicate time achieving and maintaining a healthier weight. Monique Gomez is interested in becoming our patient and working on intensive lifestyle modifications including (but not limited to) diet and exercise for weight loss.  Monique Gomez does not like to cook and notes being tired when she gets home from work. She snacks mainly on carbohydrates. She states she skips lunch.  Monique Gomez's habits were reviewed today and are as follows: Her family sometimes eats meals together, she thinks her family will eat healthier with her, her desired weight loss is 91 lbs, she has been heavy most of her life, her heaviest weight ever was 300+ pounds, she craves rice, pasta, and bread, she skips lunch frequently, she is frequently drinking liquids with calories, she sometimes makes poor food choices, she sometimes eats larger portions than normal and she struggles with emotional eating.  Depression Screen Monique Gomez's Food and Mood (modified PHQ-9) score was 9.  Depression screen PHQ 2/9 07/17/2019  Decreased Interest 1  Down, Depressed, Hopeless 1  PHQ - 2 Score 2  Altered sleeping 0  Tired, decreased energy 2  Change in appetite 2  Feeling bad or failure about yourself  1  Trouble concentrating 1  Moving slowly or fidgety/restless 1  Suicidal thoughts 0  PHQ-9 Score 9  Difficult doing work/chores Somewhat difficult   Subjective:   Other fatigue. Monique Gomez denies daytime somnolence and denies waking up still tired. Monique Gomez generally gets 8-10 hours of sleep per night, and  states that she has generally restful sleep. Snoring is not present (wears a CPAP). Apneic episodes are not present. Epworth Sleepiness Score is 4.  Shortness of breath on exertion. Monique Gomez notes increasing shortness of breath with certain activities and seems to be worsening over time with weight gain. She notes getting out of breath sooner with activity than she used to. This has gotten worse recently. Monique Gomez denies shortness of breath at rest or orthopnea.  Essential hypertension, chronic. Monique Gomez is taking lisinopril and atenolol. Blood pressure is reasonably well controlled.  BP Readings from Last 3 Encounters:  07/17/19 127/85  06/18/19 (!) 155/88  06/05/19 121/72   Lab Results  Component Value Date   CREATININE 0.84 06/03/2019   CREATININE 0.82 02/12/2019   CREATININE 1.01 (H) 01/14/2019   Type 2 diabetes mellitus without complication, without long-term current use of insulin ([place]). Monique Gomez is taking metformin.  Lab Results  Component Value Date   HGBA1C 6.5 (H) 06/03/2019   HGBA1C 6.6 (H) 01/14/2019   HGBA1C 6.6 (H) 10/09/2018   Lab Results  Component Value Date   MICROALBUR 2.9 01/14/2019   LDLCALC 126 (H) 01/14/2019   CREATININE 0.84 06/03/2019   No results found for: INSULIN  Chronic migraine. Monique Gomez is taking Topamax. She reports rare migraines.  Other iron deficiency anemia. Monique Gomez endorses some fatigue, which is longstanding.  CBC Latest Ref Rng & Units 02/12/2019 01/14/2019 10/24/2018  WBC 4.0 - 10.5 K/uL 8.5 9.7 11.4(H)  Hemoglobin 12.0 - 15.0 g/dL 11.5(L) 12.0 12.7  Hematocrit 36.0 - 46.0 % 38.8 40.5 42.8  Platelets 150 -  400 K/uL 286 315 381   Lab Results  Component Value Date   IRON 44 (L) 10/09/2018   TIBC 346 10/09/2018   FERRITIN 75 10/09/2018   Lab Results  Component Value Date   [id number] 08/27/2010   OSA (obstructive sleep apnea). Monique Gomez wears a CPAP. OSA is reasonably well controlled.  Atrial fibrillation, unspecified type ([place]).  Monique Gomez is taking Eliquis. Atrial fibrillation is controlled with medication.  Depression screening. Monique Gomez had a mildly positive depression screen with a PHQ-9 score of 9.  At risk for hypoglycemia. Monique Gomez is at increased risk for hypoglycemia secondary to dietary changes and diabetes type II.   Assessment/Plan:   Other fatigue. Monique Gomez does feel that her weight is causing her energy to be lower than it should be. Fatigue may be related to obesity, depression or many other causes. Labs will be ordered, and in the meanwhile, Monique Gomez will focus on self care including making healthy food choices, increasing physical activity and focusing on stress reduction. EKG 12-Lead, T3, T4, free, TSH, Vitamin B12, VITAMIN D 25 Hydroxy (Vit-D Deficiency, Fractures) studies ordered.  Shortness of breath on exertion. Monique Gomez does feel that she gets out of breath more easily that she used to when she exercises. Monique Gomez's shortness of breath appears to be obesity related and exercise induced. She has agreed to work on weight loss and gradually increase exercise to treat her exercise induced shortness of breath. Will continue to monitor closely.  Essential hypertension, chronic. Monique Gomez is working on healthy weight loss and exercise to improve blood pressure control. We will watch for signs of hypotension as she continues her lifestyle modifications. She will continue medications as directed and will decrease or eliminate sodium in her food.  Type 2 diabetes mellitus without complication, without long-term current use of insulin (Monique Gomez). Good blood sugar control is important to decrease the likelihood of diabetic complications such as nephropathy, neuropathy, limb loss, blindness, coronary artery disease, and death. Intensive lifestyle modification including diet, exercise and weight loss are the first line of treatment for diabetes. Monique Gomez will continue metformin as directed. Hemoglobin A1c, Insulin, random labs ordered  today.  Chronic migraine. Monique Gomez will continue her medication as directed. She will follow-up with her PCP and Neurology.  Other iron deficiency anemia. Orders and follow up as documented in patient record. CBC with Differential/Platelet ordered today.  Counseling . Iron is essential for our bodies to make red blood cells.  Reasons that someone may be deficient include: an iron-deficient diet (more likely in those following vegan or vegetarian diets), women with heavy menses, patients with GI disorders or poor absorption, patients that have had bariatric surgery, frequent blood donors, patients with cancer, and patients with heart disease.   Marland Kitchen An iron supplement has been recommended. This is found over-the-counter.   Marden Noble foods include dark leafy greens, red and white meats, eggs, seafood, and beans.   . Certain foods and drinks prevent your body from absorbing iron properly. Avoid eating these foods in the same meal as iron-rich foods or with iron supplements. These foods include: coffee, black tea, and red wine; milk, dairy products, and foods that are high in calcium; beans and soybeans; whole grains.  . Constipation can be a side effect of iron supplementation. Increased water and fiber intake are helpful. Water goal: > 2 liters/day. Fiber goal: > 25 grams/day.    OSA (obstructive sleep apnea). Intensive lifestyle modifications are the first line treatment for this issue. We discussed several lifestyle modifications today  and she will continue to work on diet, exercise and weight loss efforts. We will continue to monitor. Orders and follow up as documented in patient record. Monique Gomez will continue to wear CPAP nightly.  Counseling  Sleep apnea is a condition in which breathing pauses or becomes shallow during sleep. This happens over and over during the night. This disrupts your sleep and keeps your body from getting the rest that it needs, which can cause tiredness and lack of energy  (fatigue) during the day.  Sleep apnea treatment: If you were given a device to open your airway while you sleep, USE IT!  Sleep hygiene:   Limit or avoid alcohol, caffeinated beverages, and cigarettes, especially close to bedtime.   Do not eat a large meal or eat spicy foods right before bedtime. This can lead to digestive discomfort that can make it hard for you to sleep.  Keep a sleep diary to help you and your health care provider figure out what could be causing your insomnia.  . Make your bedroom a dark, comfortable place where it is easy to fall asleep. ? Put up shades or blackout curtains to block light from outside. ? Use a white noise machine to block noise. ? Keep the temperature cool. . Limit screen use before bedtime. This includes: ? Watching TV. ? Using your smartphone, tablet, or computer. . Stick to a routine that includes going to bed and waking up at the same times every day and night. This can help you fall asleep faster. Consider making a quiet activity, such as reading, part of your nighttime routine. . Try to avoid taking naps during the day so that you sleep better at night. . Get out of bed if you are still awake after 15 minutes of trying to sleep. Keep the lights down, but try reading or doing a quiet activity. When you feel sleepy, go back to bed.  Atrial fibrillation, unspecified type ([place]). Monique Gomez will continue her medication as directed. She will follow-up with her PCP and Cardiology.  Depression screening. Monique Gomez had a positive depression screening. Depression is commonly associated with obesity and often results in emotional eating behaviors. We will monitor this closely and work on CBT to help improve the non-hunger eating patterns. Referral to Psychology may be required if no improvement is seen as she continues in our clinic.  At risk for hypoglycemia. Monique Gomez was given approximately 15 minutes of counseling today regarding prevention of hypoglycemia.  She was advised of symptoms of hypoglycemia. Monique Gomez was instructed to avoid skipping meals, eat regular protein rich meals and schedule low calorie snacks as needed.   Repetitive spaced learning was employed today to elicit superior memory formation and behavioral change.   Class 3 severe obesity with serious comorbidity and body mass index (BMI) of 45.0 to 49.9 in adult, unspecified obesity type ([place]).  Bresha is currently in the action stage of change and her goal is to continue with weight loss efforts. I recommend Monique Gomez begin the structured treatment plan as follows:  She has agreed to the Category 3 Plan.  She will work on meal planning and will stop all sugary drinks.  We reviewed with the patient labs from 06/03/2019 including CMP and A1c.  Exercise goals: Jenah will begin to walk.  Behavioral modification strategies: increasing lean protein intake, decreasing simple carbohydrates, increasing vegetables, increasing water intake, decreasing eating out, no skipping meals, meal planning and cooking strategies, keeping healthy foods in the home and planning for success.  She was informed of the importance of frequent follow-up visits to maximize her success with intensive lifestyle modifications for her multiple health conditions. She was informed we would discuss her lab results at her next visit unless there is a critical issue that needs to be addressed sooner. Ayleah agreed to keep her next visit at the agreed upon time to discuss these results.  Objective:   Blood pressure 127/85, pulse 72, temperature 97.8 F (36.6 C), temperature source Oral, height 5\' 3"  (1.6 m), weight 281 lb (127.5 kg), SpO2 100 %. Body mass index is 49.78 kg/m.  EKG: Sinus  Rhythm with a rate of 66 BPM. Extensive T-abnormality. Poor R wave progression. Otherwise normal.   Indirect Calorimeter completed today shows a VO2 of 298 and a REE of 2076.  Her calculated basal metabolic rate is [id number] thus her  basal metabolic rate is better than expected.  General: Cooperative, alert, well developed, in no acute distress. HEENT: Conjunctivae and lids unremarkable. Cardiovascular: Regular rhythm.  Lungs: Normal work of breathing. Neurologic: No focal deficits.   Lab Results  Component Value Date   CREATININE 0.84 06/03/2019   BUN 21 06/03/2019   NA 141 06/03/2019   K 3.9 06/03/2019   CL 109 06/03/2019   CO2 24 06/03/2019   Lab Results  Component Value Date   ALT 8 06/03/2019   AST 9 (L) 06/03/2019   ALKPHOS 77 02/12/2019   BILITOT 0.2 06/03/2019   Lab Results  Component Value Date   HGBA1C 6.5 (H) 06/03/2019   HGBA1C 6.6 (H) 01/14/2019   HGBA1C 6.6 (H) 10/09/2018   HGBA1C 6.9 (H) 10/23/2017   HGBA1C 6.5 (H) 02/20/2017   No results found for: INSULIN Lab Results  Component Value Date   TSH 2.46 10/24/2018   Lab Results  Component Value Date   CHOL 185 01/14/2019   HDL 36 (L) 01/14/2019   LDLCALC 126 (H) 01/14/2019   TRIG 120 01/14/2019   CHOLHDL 5.1 (H) 01/14/2019   Lab Results  Component Value Date   WBC 8.5 02/12/2019   HGB 11.5 (L) 02/12/2019   HCT 38.8 02/12/2019   MCV 73.5 (L) 02/12/2019   PLT 286 02/12/2019   Lab Results  Component Value Date   IRON 44 (L) 10/09/2018   TIBC 346 10/09/2018   FERRITIN 75 10/09/2018   Attestation Statements:   Reviewed by clinician on day of visit: allergies, medications, problem list, medical history, surgical history, family history, social history, and previous encounter notes.  Migdalia Dk, am acting as Location manager for [employer], DO   I have reviewed the above documentation for accuracy and completeness, and I agree with the above. Jearld Lesch, DO

## 2019-07-18 ENCOUNTER — Encounter (INDEPENDENT_AMBULATORY_CARE_PROVIDER_SITE_OTHER): Payer: Self-pay | Admitting: Bariatrics

## 2019-07-18 DIAGNOSIS — E559 Vitamin D deficiency, unspecified: Secondary | ICD-10-CM | POA: Insufficient documentation

## 2019-07-18 LAB — INSULIN, RANDOM: INSULIN: 12.1 u[IU]/mL (ref 2.6–24.9)

## 2019-07-18 LAB — CBC WITH DIFFERENTIAL/PLATELET
Basophils Absolute: 0 10*3/uL (ref 0.0–0.2)
Basos: 0 %
EOS (ABSOLUTE): 0.2 10*3/uL (ref 0.0–0.4)
Eos: 2 %
Hematocrit: 37.2 % (ref 34.0–46.6)
Hemoglobin: 11.2 g/dL (ref 11.1–15.9)
Immature Grans (Abs): 0 10*3/uL (ref 0.0–0.1)
Immature Granulocytes: 0 %
Lymphocytes Absolute: 1.8 10*3/uL (ref 0.7–3.1)
Lymphs: 17 %
MCH: 22.4 pg — ABNORMAL LOW (ref 26.6–33.0)
MCHC: 30.1 g/dL — ABNORMAL LOW (ref 31.5–35.7)
MCV: 75 fL — ABNORMAL LOW (ref 79–97)
Monocytes Absolute: 0.6 10*3/uL (ref 0.1–0.9)
Monocytes: 6 %
Neutrophils Absolute: 8.4 10*3/uL — ABNORMAL HIGH (ref 1.4–7.0)
Neutrophils: 75 %
Platelets: 352 10*3/uL (ref 150–450)
RBC: 4.99 x10E6/uL (ref 3.77–5.28)
RDW: 14.7 % (ref 11.7–15.4)
WBC: 11.1 10*3/uL — ABNORMAL HIGH (ref 3.4–10.8)

## 2019-07-18 LAB — HEMOGLOBIN A1C
Est. average glucose Bld gHb Est-mCnc: 137 mg/dL
Hgb A1c MFr Bld: 6.4 % — ABNORMAL HIGH (ref 4.8–5.6)

## 2019-07-18 LAB — T4, FREE: Free T4: 1.08 ng/dL (ref 0.82–1.77)

## 2019-07-18 LAB — VITAMIN B12: Vitamin B-12: 428 pg/mL (ref 232–1245)

## 2019-07-18 LAB — VITAMIN D 25 HYDROXY (VIT D DEFICIENCY, FRACTURES): Vit D, 25-Hydroxy: 10 ng/mL — ABNORMAL LOW (ref 30.0–100.0)

## 2019-07-18 LAB — TSH: TSH: 2.21 u[IU]/mL (ref 0.450–4.500)

## 2019-07-18 LAB — T3: T3, Total: 148 ng/dL (ref 71–180)

## 2019-07-23 ENCOUNTER — Ambulatory Visit: Payer: Self-pay | Attending: Internal Medicine

## 2019-07-23 DIAGNOSIS — Z23 Encounter for immunization: Secondary | ICD-10-CM

## 2019-07-23 NOTE — Progress Notes (Signed)
   Covid-19 Vaccination Clinic  Name:  Monique Gomez    MRN: [id number] DOB: 1955-11-30  07/23/2019  [name] was observed post Covid-19 immunization for 15 minutes without incident. She was provided with Vaccine Information Sheet and instruction to access the V-Safe system.   [name] was instructed to call 911 with any severe reactions post vaccine: Marland Kitchen Difficulty breathing  . Swelling of face and throat  . A fast heartbeat  . A bad rash all over body  . Dizziness and weakness   Immunizations Administered    Name Date Dose VIS Date Route   Moderna COVID-19 Vaccine 07/23/2019  9:49 AM 0.5 mL 04/02/2019 Intramuscular   Manufacturer: Levan Hurst   LotUT:[id number]   [id number][id number]

## 2019-07-29 DIAGNOSIS — Z961 Presence of intraocular lens: Secondary | ICD-10-CM | POA: Diagnosis not present

## 2019-07-29 DIAGNOSIS — H43393 Other vitreous opacities, bilateral: Secondary | ICD-10-CM | POA: Diagnosis not present

## 2019-07-29 DIAGNOSIS — H3550 Unspecified hereditary retinal dystrophy: Secondary | ICD-10-CM | POA: Diagnosis not present

## 2019-07-29 DIAGNOSIS — H52223 Regular astigmatism, bilateral: Secondary | ICD-10-CM | POA: Diagnosis not present

## 2019-07-31 ENCOUNTER — Ambulatory Visit (INDEPENDENT_AMBULATORY_CARE_PROVIDER_SITE_OTHER): Payer: BC Managed Care – PPO | Admitting: Bariatrics

## 2019-08-01 ENCOUNTER — Other Ambulatory Visit: Payer: Self-pay

## 2019-08-01 ENCOUNTER — Encounter (INDEPENDENT_AMBULATORY_CARE_PROVIDER_SITE_OTHER): Payer: Self-pay | Admitting: Family Medicine

## 2019-08-01 ENCOUNTER — Ambulatory Visit (INDEPENDENT_AMBULATORY_CARE_PROVIDER_SITE_OTHER): Payer: BC Managed Care – PPO | Admitting: Family Medicine

## 2019-08-01 VITALS — BP 117/81 | HR 75 | Temp 97.5°F | Ht 63.0 in | Wt 274.0 lb

## 2019-08-01 DIAGNOSIS — E559 Vitamin D deficiency, unspecified: Secondary | ICD-10-CM | POA: Diagnosis not present

## 2019-08-01 DIAGNOSIS — E1165 Type 2 diabetes mellitus with hyperglycemia: Secondary | ICD-10-CM | POA: Diagnosis not present

## 2019-08-01 DIAGNOSIS — Z6841 Body Mass Index (BMI) 40.0 and over, adult: Secondary | ICD-10-CM

## 2019-08-01 DIAGNOSIS — Z9189 Other specified personal risk factors, not elsewhere classified: Secondary | ICD-10-CM

## 2019-08-01 DIAGNOSIS — D508 Other iron deficiency anemias: Secondary | ICD-10-CM

## 2019-08-01 MED ORDER — VITAMIN D (ERGOCALCIFEROL) 1.25 MG (50000 UNIT) PO CAPS: 50000.0000 [IU] | ORAL_CAPSULE | ORAL | 0 refills | Status: DC

## 2019-08-01 MED ORDER — RYBELSUS 3 MG PO TABS: 3.0000 mg | ORAL_TABLET | Freq: Every day | ORAL | 0 refills | Status: DC

## 2019-08-01 NOTE — Progress Notes (Signed)
Chief Complaint:   OBESITY Monique Gomez is here to discuss her progress with her obesity treatment plan along with follow-up of her obesity related diagnoses. Monique Gomez is on the Category 3 Plan. Monique Gomez states she is biking for 10 minutes 3 times a week and walking 1/4 mile 5 times per week.  Today's visit was #: 2 Starting weight: 281 lbs Starting date: 07/17/2019 Today's weight: 274 lbs Today's date: 08/01/2019 Total lbs lost to date: 7 lbs Total lbs lost since last in-office visit: 7 lbs  Interim History: Monique Gomez reports that the first few weeks were okay.  She had to get used to cutting out rice and pasta.  Eggs with Pam made her sick.  She did plain [place] yogurt with blueberries.  For lunch she did [place] with lettuce with Lynnae Sandhoff bread, and dinner was 3 chicken wings and vegetables.  Snacks were cheese sticks.  Subjective:   1. Vitamin D deficiency Monique Gomez's Vitamin D level was 10.0 on 07/17/2019. She is currently taking no vitamin D supplement. She denies nausea, vomiting or muscle weakness.  Positive for fatigue.  2. Other iron deficiency anemia Monique Gomez is not a vegetarian.  She does not have a history of weight loss surgery.  She is not taking an iron supplement due to constipation.  MCV is 75.  She endorses fatigue.  CBC Latest Ref Rng & Units 07/17/2019 02/12/2019 01/14/2019  WBC 3.4 - 10.8 x10E3/uL 11.1(H) 8.5 9.7  Hemoglobin 11.1 - 15.9 g/dL 11.2 11.5(L) 12.0  Hematocrit 34.0 - 46.6 % 37.2 38.8 40.5  Platelets 150 - 450 x10E3/uL 352 286 315   Lab Results  Component Value Date   IRON 44 (L) 10/09/2018   TIBC 346 10/09/2018   FERRITIN 75 10/09/2018   Lab Results  Component Value Date   VITAMINB12 428 07/17/2019   3. Type 2 diabetes mellitus with hyperglycemia, without long-term current use of insulin ([facility]) Monique Gomez is taking metformin 500 mg twice daily.  She is on a statin, ACE, metformin, and Eliquis.  Last eye exam was 3 days ago.  Last foot exam was a few months  ago.  Lab Results  Component Value Date   HGBA1C 6.4 (H) 07/17/2019   HGBA1C 6.5 (H) 06/03/2019   HGBA1C 6.6 (H) 01/14/2019   Lab Results  Component Value Date   MICROALBUR 2.9 01/14/2019   LDLCALC 126 (H) 01/14/2019   CREATININE 0.84 06/03/2019   Lab Results  Component Value Date   INSULIN 12.1 07/17/2019   4. At risk for osteoporosis Monique Gomez is at higher risk of osteopenia and osteoporosis due to Vitamin D deficiency.   Assessment/Plan:   1. Vitamin D deficiency Low Vitamin D level contributes to fatigue and are associated with obesity, breast, and colon cancer. She agrees to continue to take prescription Vitamin D @50 ,000 IU every week and will follow-up for routine testing of Vitamin D, at least 2-3 times per year to avoid over-replacement. - Vitamin D, Ergocalciferol, (DRISDOL) 1.25 MG (50000 UNIT) CAPS capsule; Take 1 capsule (50,000 Units total) by mouth every 7 (seven) days.  Dispense: 4 capsule; Refill: 0  2. Other iron deficiency anemia Orders and follow up as documented in patient record.  She is going to look into starting ferrous gluconate.  Counseling . Iron is essential for our bodies to make red blood cells.  Reasons that someone may be deficient include: an iron-deficient diet (more likely in those following vegan or vegetarian diets), women with heavy menses, patients with GI  disorders or poor absorption, patients that have had bariatric surgery, frequent blood donors, patients with cancer, and patients with heart disease.   Marden Noble foods include dark leafy greens, red and white meats, eggs, seafood, and beans.   . Certain foods and drinks prevent your body from absorbing iron properly. Avoid eating these foods in the same meal as iron-rich foods or with iron supplements. These foods include: coffee, black tea, and red wine; milk, dairy products, and foods that are high in calcium; beans and soybeans; whole grains.  . Constipation can be a side effect of iron  supplementation. Increased water and fiber intake re helpful. Water goal: > 2 liters/day. Fiber goal: > 25 grams/day.  3. Type 2 diabetes mellitus with hyperglycemia, without long-term current use of insulin ([facility]) Good blood sugar control is important to decrease the likelihood of diabetic complications such as nephropathy, neuropathy, limb loss, blindness, coronary artery disease, and death. Intensive lifestyle modification including diet, exercise and weight loss are the first line of treatment for diabetes.  Will start Rybelsus, as below. - Semaglutide (RYBELSUS) 3 MG TABS; Take 3 mg by mouth daily.  Dispense: 30 tablet; Refill: 0  4. At risk for osteoporosis Monique Gomez was given approximately 15 minutes of osteoporosis prevention counseling today. Monique Gomez is at risk for osteopenia and osteoporosis due to her Vitamin D deficiency. She was encouraged to take her Vitamin D and follow her higher calcium diet and increase strengthening exercise to help strengthen her bones and decrease her risk of osteopenia and osteoporosis.  Repetitive spaced learning was employed today to elicit superior memory formation and behavioral change.  5. Class 3 severe obesity with serious comorbidity and body mass index (BMI) of 45.0 to 49.9 in adult, unspecified obesity type ([facility]) Monique Gomez is currently in the action stage of change. As such, her goal is to continue with weight loss efforts. She has agreed to the Category 3 Plan or the [place] +300 calories.   Exercise goals: As is.  Behavioral modification strategies: increasing lean protein intake, increasing vegetables, meal planning and cooking strategies and planning for success.  Monique Gomez has agreed to follow-up with our clinic in 2-3 weeks. She was informed of the importance of frequent follow-up visits to maximize her success with intensive lifestyle modifications for her multiple health conditions.   Objective:   Blood pressure 117/81, pulse 75,  temperature (!) 97.5 F (36.4 C), temperature source Oral, height 5\' 3"  (1.6 m), weight 274 lb (124.3 kg), SpO2 97 %. Body mass index is 48.54 kg/m.  General: Cooperative, alert, well developed, in no acute distress. HEENT: Conjunctivae and lids unremarkable. Cardiovascular: Regular rhythm.  Lungs: Normal work of breathing. Neurologic: No focal deficits.   Lab Results  Component Value Date   CREATININE 0.84 06/03/2019   BUN 21 06/03/2019   NA 141 06/03/2019   K 3.9 06/03/2019   CL 109 06/03/2019   CO2 24 06/03/2019   Lab Results  Component Value Date   ALT 8 06/03/2019   AST 9 (L) 06/03/2019   ALKPHOS 77 02/12/2019   BILITOT 0.2 06/03/2019   Lab Results  Component Value Date   HGBA1C 6.4 (H) 07/17/2019   HGBA1C 6.5 (H) 06/03/2019   HGBA1C 6.6 (H) 01/14/2019   HGBA1C 6.6 (H) 10/09/2018   HGBA1C 6.9 (H) 10/23/2017   Lab Results  Component Value Date   INSULIN 12.1 07/17/2019   Lab Results  Component Value Date   TSH 2.210 07/17/2019   Lab Results  Component  Value Date   CHOL 185 01/14/2019   HDL 36 (L) 01/14/2019   LDLCALC 126 (H) 01/14/2019   TRIG 120 01/14/2019   CHOLHDL 5.1 (H) 01/14/2019   Lab Results  Component Value Date   WBC 11.1 (H) 07/17/2019   HGB 11.2 07/17/2019   HCT 37.2 07/17/2019   MCV 75 (L) 07/17/2019   PLT 352 07/17/2019   Lab Results  Component Value Date   IRON 44 (L) 10/09/2018   TIBC 346 10/09/2018   FERRITIN 75 10/09/2018   Attestation Statements:   Reviewed by clinician on day of visit: allergies, medications, problem list, medical history, surgical history, family history, social history, and previous encounter notes.  I, Water quality scientist, CMA, am acting as transcriptionist for Coralie Common, MD.  I have reviewed the above documentation for accuracy and completeness, and I agree with the above. - Ilene Qua, MD

## 2019-08-05 ENCOUNTER — Other Ambulatory Visit: Payer: Self-pay | Admitting: Family Medicine

## 2019-08-20 ENCOUNTER — Ambulatory Visit (INDEPENDENT_AMBULATORY_CARE_PROVIDER_SITE_OTHER): Payer: BC Managed Care – PPO | Admitting: Family Medicine

## 2019-08-20 ENCOUNTER — Other Ambulatory Visit: Payer: Self-pay

## 2019-08-20 VITALS — BP 135/82 | HR 78 | Temp 97.4°F | Ht 63.0 in | Wt 274.0 lb

## 2019-08-20 DIAGNOSIS — Z9189 Other specified personal risk factors, not elsewhere classified: Secondary | ICD-10-CM

## 2019-08-20 DIAGNOSIS — E559 Vitamin D deficiency, unspecified: Secondary | ICD-10-CM

## 2019-08-20 DIAGNOSIS — E1165 Type 2 diabetes mellitus with hyperglycemia: Secondary | ICD-10-CM

## 2019-08-20 DIAGNOSIS — Z6841 Body Mass Index (BMI) 40.0 and over, adult: Secondary | ICD-10-CM

## 2019-08-20 MED ORDER — SEMAGLUTIDE 7 MG PO TABS: 7.0000 mg | ORAL_TABLET | Freq: Every day | ORAL | 0 refills | Status: DC

## 2019-08-20 MED ORDER — VITAMIN D (ERGOCALCIFEROL) 1.25 MG (50000 UNIT) PO CAPS: 50000.0000 [IU] | ORAL_CAPSULE | ORAL | 0 refills | Status: DC

## 2019-08-21 ENCOUNTER — Ambulatory Visit: Payer: BC Managed Care – PPO | Admitting: Family Medicine

## 2019-08-21 ENCOUNTER — Encounter: Payer: Self-pay | Admitting: Family Medicine

## 2019-08-21 VITALS — BP 130/74 | HR 70 | Temp 97.9°F | Resp 14 | Ht 63.0 in | Wt 277.0 lb

## 2019-08-21 DIAGNOSIS — E669 Obesity, unspecified: Secondary | ICD-10-CM | POA: Diagnosis not present

## 2019-08-21 DIAGNOSIS — I480 Paroxysmal atrial fibrillation: Secondary | ICD-10-CM

## 2019-08-21 DIAGNOSIS — E785 Hyperlipidemia, unspecified: Secondary | ICD-10-CM | POA: Diagnosis not present

## 2019-08-21 DIAGNOSIS — Z23 Encounter for immunization: Secondary | ICD-10-CM | POA: Diagnosis not present

## 2019-08-21 DIAGNOSIS — Z0001 Encounter for general adult medical examination with abnormal findings: Secondary | ICD-10-CM | POA: Diagnosis not present

## 2019-08-21 DIAGNOSIS — E119 Type 2 diabetes mellitus without complications: Secondary | ICD-10-CM

## 2019-08-21 DIAGNOSIS — Z0000 Encounter for general adult medical examination without abnormal findings: Secondary | ICD-10-CM

## 2019-08-21 DIAGNOSIS — I10 Essential (primary) hypertension: Secondary | ICD-10-CM

## 2019-08-21 DIAGNOSIS — G4733 Obstructive sleep apnea (adult) (pediatric): Secondary | ICD-10-CM

## 2019-08-21 LAB — COMPREHENSIVE METABOLIC PANEL
AG Ratio: 1.2 (calc) (ref 1.0–2.5)
ALT: 8 U/L (ref 6–29)
AST: 10 U/L (ref 10–35)
Albumin: 3.8 g/dL (ref 3.6–5.1)
Alkaline phosphatase (APISO): 75 U/L (ref 37–153)
BUN: 17 mg/dL (ref 7–25)
CO2: 24 mmol/L (ref 20–32)
Calcium: 9.7 mg/dL (ref 8.6–10.4)
Chloride: 110 mmol/L (ref 98–110)
Creat: 0.92 mg/dL (ref 0.50–0.99)
Globulin: 3.1 g/dL (calc) (ref 1.9–3.7)
Glucose, Bld: 101 mg/dL — ABNORMAL HIGH (ref 65–99)
Potassium: 4 mmol/L (ref 3.5–5.3)
Sodium: 142 mmol/L (ref 135–146)
Total Bilirubin: 0.3 mg/dL (ref 0.2–1.2)
Total Protein: 6.9 g/dL (ref 6.1–8.1)

## 2019-08-21 LAB — CBC WITH DIFFERENTIAL/PLATELET
Absolute Monocytes: 738 cells/uL (ref 200–950)
Basophils Absolute: 54 cells/uL (ref 0–200)
Basophils Relative: 0.6 %
Eosinophils Absolute: 288 cells/uL (ref 15–500)
Eosinophils Relative: 3.2 %
HCT: 38.3 % (ref 35.0–45.0)
Hemoglobin: 11.5 g/dL — ABNORMAL LOW (ref 11.7–15.5)
Lymphs Abs: 2106 cells/uL (ref 850–3900)
MCH: 22 pg — ABNORMAL LOW (ref 27.0–33.0)
MCHC: 30 g/dL — ABNORMAL LOW (ref 32.0–36.0)
MCV: 73.4 fL — ABNORMAL LOW (ref 80.0–100.0)
MPV: 10.9 fL (ref 7.5–12.5)
Monocytes Relative: 8.2 %
Neutro Abs: 5814 cells/uL (ref 1500–7800)
Neutrophils Relative %: 64.6 %
Platelets: 317 10*3/uL (ref 140–400)
RBC: 5.22 10*6/uL — ABNORMAL HIGH (ref 3.80–5.10)
RDW: 14.8 % (ref 11.0–15.0)
Total Lymphocyte: 23.4 %
WBC: 9 10*3/uL (ref 3.8–10.8)

## 2019-08-21 LAB — LIPID PANEL
Cholesterol: 145 mg/dL (ref <200)
HDL: 33 mg/dL — ABNORMAL LOW (ref >=50)
LDL Cholesterol (Calc): 96 mg/dL (calc)
Non-HDL Cholesterol (Calc): 112 mg/dL (calc) (ref <130)
Total CHOL/HDL Ratio: 4.4 (calc) (ref <5.0)
Triglycerides: 72 mg/dL (ref <150)

## 2019-08-21 NOTE — Assessment & Plan Note (Signed)
Continues to benefit from CPAP Therapy

## 2019-08-21 NOTE — Assessment & Plan Note (Signed)
Managed by cardiology Normal rate, on eliquis

## 2019-08-21 NOTE — Assessment & Plan Note (Signed)
Now followed by healthy weight and wellness  Recently started on GLP therapy

## 2019-08-21 NOTE — Assessment & Plan Note (Addendum)
Continue statin drug Lipitor Goal LDL below 100  Recheck Lipid panel

## 2019-08-21 NOTE — Progress Notes (Signed)
Subjective:    Patient ID: Monique Gomez, female    DOB: [birth year], 64 y.o.   MRN: [id number]  Patient presents for Annual Exam (is fasting) Patient here for complete physical exam.  Medications reviewed She is followed by cardiology, neurology, orthopedics and [facility].  Recent medication changes she was started on oral GLP-1 therapy to help with weight loss A1c was 6.4%  she is eating more veggies and fruit, fish and chicken, and now drinking water occ sugar free packets    Vitamin D def- started on high dose Vitamin D therapy by [facility]  Colonoscopy UTD Mammogram UTD, due for repeat in Oct 2021   PAP Smear UTD- [facility]- Dr. Elonda Husky  Immunizations UTD - TDAP expires this August , including COVID -19 vaccine  CBC 1 month ago, had mild leukocytosis,  Needs repeat    Hyperlipidemia- lipitor increased to 80mg , 6 months ago, due for repeat   Migraines- still on aimovig once a month, she still has tingling with topamax, but the benefits of the medicine outweigh the risk so she wants to continue since her migraines are controlled    Followed by dentist and eye doctor    No new concerns    Review Of Systems:  GEN- denies fatigue, fever, weight loss,weakness, recent illness HEENT- denies eye drainage, change in vision, nasal discharge, CVS- denies chest pain, palpitations RESP- denies SOB, cough, wheeze ABD- denies N/V, change in stools, abd pain GU- denies dysuria, hematuria, dribbling, incontinence MSK- denies joint pain, muscle aches, injury Neuro- denies headache, dizziness, syncope, seizure activity       Objective:    BP 130/74   Pulse 70   Temp 97.9 F (36.6 C) (Temporal)   Resp 14   Ht 5\' 3"  (1.6 m)   Wt 277 lb (125.6 kg)   SpO2 98%   BMI 49.07 kg/m  GEN- NAD, alert and oriented x3 HEENT- PERRL, EOMI, non injected sclera, pink conjunctiva, MMM, oropharynx clear, TM clear bilat no effusion   Neck- Supple, no thyromegaly,left supraclavicular node NT , no bruit   CVS- RRR, no murmur RESP-CTAB ABD-NABS,soft,NT,ND EXT- No edema Pulses- Radial, DP- 2+        Assessment & Plan:      Problem List Items Addressed This Visit      Unprioritized   Class 3 obesity    Now followed by [facility]  Recently started on GLP therapy       Diabetes mellitus (HCC)    A1C controlled on Metformin, per below, additional GLP-1 to help with weight loss       RESOLVED: Essential hypertension, benign   Relevant Orders   CBC with Differential/Platelet   Comprehensive metabolic panel   Lipid panel   Hyperlipidemia    Continue statin drug Lipitor Goal LDL below 100  Recheck Lipid panel       Relevant Orders   Lipid panel   Obstructive sleep apnea    Continues to benefit from CPAP Therapy      PAF (paroxysmal atrial fibrillation) ([place])    Managed by cardiology Normal rate, on eliquis        Other Visit Diagnoses    Routine general medical examination at a health care facility    -  Primary   CPE done, TDAP given   Need for tetanus, diphtheria, and acellular pertussis (Tdap) vaccine in patient of adolescent age or older  Relevant Orders   Tdap vaccine greater than or equal to 7yo IM (Completed)      Note: This dictation was prepared with Dragon dictation along with smaller phrase technology. Any transcriptional errors that result from this process are unintentional.

## 2019-08-21 NOTE — Assessment & Plan Note (Signed)
A1C controlled on Metformin, per below, additional GLP-1 to help with weight loss

## 2019-08-21 NOTE — Patient Instructions (Signed)
F/U 4 months TDAP shot done

## 2019-08-22 NOTE — Progress Notes (Signed)
Chief Complaint:   OBESITY Monique Gomez is here to discuss her progress with her obesity treatment plan along with follow-up of her obesity related diagnoses. Monique Gomez is on the Category 3 Plan or the [place] + 300 calories and states she is following her eating plan approximately 65-70% of the time. Monique Gomez states she is walking 1 mile 5 times per week.  Today's visit was #: 3 Starting weight: 281 lbs Starting date: 07/17/2019 Today's weight: 274 lbs Today's date: 08/20/2019 Total lbs lost to date: 7 Total lbs lost since last in-office visit: 0  Interim History: Monique Gomez is experiencing allergic rhinitis. She had company from [place] so she ate off the plan and has macaroni salad. Her company left just 2 days ago. She went back on the meal plan when her company left. She has food in the house for the meal plan.  Subjective:   1. Type 2 diabetes mellitus with hyperglycemia, without long-term current use of insulin (HCC) Monique Gomez is on metformin, ACE, statin, and Rybelsus. She notes occasional carbohydrate cravings. She denies GI side effects of metformin.  2. Vitamin D deficiency Monique Gomez denies nausea, vomiting, or muscle weakness, but she notes fatigue. Last Vit D level was 10.0.  3. At risk for osteoporosis Monique Gomez is at higher risk of osteopenia and osteoporosis due to Vitamin D deficiency.   Assessment/Plan:   1. Type 2 diabetes mellitus with hyperglycemia, without long-term current use of insulin (HCC) Good blood sugar control is important to decrease the likelihood of diabetic complications such as nephropathy, neuropathy, limb loss, blindness, coronary artery disease, and death. Intensive lifestyle modification including diet, exercise and weight loss are the first line of treatment for diabetes. Monique Gomez will continue the Category 3 or Pescatarian plan, we will refill Rybelsus for 1 month. We will repeat labs in 3 months.  - Semaglutide 7 MG TABS; Take 7 mg by mouth  daily before breakfast.  Dispense: 30 tablet; Refill: 0  2. Vitamin D deficiency Low Vitamin D level contributes to fatigue and are associated with obesity, breast, and colon cancer. We will refill prescription Vitamin D for 1 month. Monique Gomez will follow-up for routine testing of Vitamin D, at least 2-3 times per year to avoid over-replacement.  - Vitamin D, Ergocalciferol, (DRISDOL) 1.25 MG (50000 UNIT) CAPS capsule; Take 1 capsule (50,000 Units total) by mouth every 7 (seven) days.  Dispense: 4 capsule; Refill: 0  3. At risk for osteoporosis Monique Gomez was given approximately 15 minutes of osteoporosis prevention counseling today. Monique Gomez is at risk for osteopenia and osteoporosis due to her Vitamin D deficiency. She was encouraged to take her Vitamin D and follow her higher calcium diet and increase strengthening exercise to help strengthen her bones and decrease her risk of osteopenia and osteoporosis.  Repetitive spaced learning was employed today to elicit superior memory formation and behavioral change.  4. Class 3 severe obesity with serious comorbidity and body mass index (BMI) of 45.0 to 49.9 in adult, unspecified obesity type (HCC) Monique Gomez is currently in the action stage of change. As such, her goal is to continue with weight loss efforts. She has agreed to the Category 3 Plan or the [place] + 300 calories.   Exercise goals: As is.  Behavioral modification strategies: increasing lean protein intake, meal planning and cooking strategies, keeping healthy foods in the home and planning for success.  Monique Gomez has agreed to follow-up with our clinic in 2 weeks. She was informed of the importance of frequent  follow-up visits to maximize her success with intensive lifestyle modifications for her multiple health conditions.   Objective:   Blood pressure 135/82, pulse 78, temperature (!) 97.4 F (36.3 C), temperature source Oral, height 5\' 3"  (1.6 m), weight 274 lb (124.3 kg), SpO2 98  %. Body mass index is 48.54 kg/m.  General: Cooperative, alert, well developed, in no acute distress. HEENT: Conjunctivae and lids unremarkable. Cardiovascular: Regular rhythm.  Lungs: Normal work of breathing. Neurologic: No focal deficits.   Lab Results  Component Value Date   CREATININE 0.92 08/21/2019   BUN 17 08/21/2019   NA 142 08/21/2019   K 4.0 08/21/2019   CL 110 08/21/2019   CO2 24 08/21/2019   Lab Results  Component Value Date   ALT 8 08/21/2019   AST 10 08/21/2019   ALKPHOS 77 02/12/2019   BILITOT 0.3 08/21/2019   Lab Results  Component Value Date   HGBA1C 6.4 (H) 07/17/2019   HGBA1C 6.5 (H) 06/03/2019   HGBA1C 6.6 (H) 01/14/2019   HGBA1C 6.6 (H) 10/09/2018   HGBA1C 6.9 (H) 10/23/2017   Lab Results  Component Value Date   INSULIN 12.1 07/17/2019   Lab Results  Component Value Date   TSH 2.210 07/17/2019   Lab Results  Component Value Date   CHOL 145 08/21/2019   HDL 33 (L) 08/21/2019   LDLCALC 96 08/21/2019   TRIG 72 08/21/2019   CHOLHDL 4.4 08/21/2019   Lab Results  Component Value Date   WBC 9.0 08/21/2019   HGB 11.5 (L) 08/21/2019   HCT 38.3 08/21/2019   MCV 73.4 (L) 08/21/2019   PLT 317 08/21/2019   Lab Results  Component Value Date   IRON 44 (L) 10/09/2018   TIBC 346 10/09/2018   FERRITIN 75 10/09/2018   Attestation Statements:   Reviewed by clinician on day of visit: allergies, medications, problem list, medical history, surgical history, family history, social history, and previous encounter notes.   I, Trixie Dredge, am acting as transcriptionist for April Manson, MD.  I have reviewed the above documentation for accuracy and completeness, and I agree with the above. - Ilene Qua, MD

## 2019-08-23 ENCOUNTER — Telehealth: Payer: Self-pay | Admitting: *Deleted

## 2019-08-23 NOTE — Telephone Encounter (Signed)
Received call from patient.   Reports that she has swollen lump on R side of neck. States that it feels similar to the lumps (lymphadenopathy) on the L side.  Denies pain, but does states that area is tender if pressed. Denies fever or any other sick Sx.   MD please advise.

## 2019-08-23 NOTE — Telephone Encounter (Signed)
Since she has had reactive benign nodes, I would just monitor for now If she gets any signs of being ill, let us know and we can evaluate  No change in meds

## 2019-08-23 NOTE — Telephone Encounter (Signed)
Call placed to patient and patient made aware.   States that she has been having some allergy sx, but will continue to monitor.

## 2019-08-26 ENCOUNTER — Other Ambulatory Visit: Payer: Self-pay | Admitting: Family Medicine

## 2019-08-26 ENCOUNTER — Ambulatory Visit: Payer: BC Managed Care – PPO | Admitting: Neurology

## 2019-08-26 DIAGNOSIS — I10 Essential (primary) hypertension: Secondary | ICD-10-CM

## 2019-09-10 ENCOUNTER — Ambulatory Visit (INDEPENDENT_AMBULATORY_CARE_PROVIDER_SITE_OTHER): Payer: BC Managed Care – PPO | Admitting: Family Medicine

## 2019-09-24 ENCOUNTER — Other Ambulatory Visit: Payer: Self-pay | Admitting: Family Medicine

## 2019-09-24 DIAGNOSIS — K219 Gastro-esophageal reflux disease without esophagitis: Secondary | ICD-10-CM

## 2019-09-30 NOTE — Progress Notes (Signed)
Cardiology Office Note  Date: 10/01/2019   ID: Monique Gomez, Monique Gomez [birth year], MRN [id number]  PCP:  Alycia Rossetti, MD  Cardiologist:  Rozann Lesches, MD Electrophysiologist:  None   Chief Complaint: F/U PAF,HTN, HLD,   History of Present Illness: Monique Gomez is a 64 y.o. female with a history of PAF, HTN, HLD, DM 2, GERD.  Last seen by Bernerd Pho, [place] 04/02/2019.  She has had a recent [employer] and updated echocardiogram.  Echocardiogram showed EF of 70 to 75% with moderately increased LVH and asymmetric septal hypertrophy, grade 1 DD, mild MR and TR.  Stress showed small inferior apical and mid inferior lateral defect.  Partially reversible in the setting of breast tissue attenuation or to be overall low risk study.  Her blood pressure was well controlled at home.  Patient denies any DOE, orthopnea, PND, lower extremity edema.  She did report feeling fatigued throughout the day and having little energy.  She had made significant dietary changes and has lost over 30 pounds within the past several months.  Patient states she has been doing well other than increasing shortness of breath over the last few months.  States she has significant allergies and has tried several allergy medications.  She has tried Zyrtec and it seems to be improving her symptoms.  Her blood pressure is elevated today at 140/70.  She states she is compliant with CPAP.  She has lost approximately 30 pounds over the last few months with dietary changes.  She otherwise denies any other anginal symptoms, orthostatic symptoms other than feeling vertigo-like symptoms when her allergies are bad.  She denies any bleeding issues, PND orthopnea, claudication-like symptoms, lower extremity edema.  Past Medical History:  Diagnosis Date  . A-fib ([place])   . Abdominal fibromatosis   . Allergic rhinitis   . Anemia   . Diabetes ([place])   . Edema of both lower extremities   . Essential hypertension   . GERD  (gastroesophageal reflux disease)   . Headache(784.0)   . History of cardiac catheterization    No significant CAD May 2015  . History of migraine headaches   . Hyperlipidemia   . Migraines   . Obesity   . PAF (paroxysmal atrial fibrillation) ([place]) 08/2013  . Sleep apnea   . Type 2 diabetes mellitus ([place])     Past Surgical History:  Procedure Laterality Date  . CATARACT EXTRACTION    . CATARACT EXTRACTION W/PHACO Right 04/05/2019   Procedure: CATARACT EXTRACTION PHACO AND INTRAOCULAR LENS PLACEMENT RIGHT EYE (CDE: 3.19);  Surgeon: Baruch Goldmann, MD;  Location: [facility];  Service: Ophthalmology;  Laterality: Right;  . COLONOSCOPY N/A 02/02/2015   SLF: 1. one colon polyp removed-no source for anemia identified. 2. moderate diverticulosis noted in the sigmoid colon and descending colon 3. the left colon is redundant 4. Rectal bleeding due ot small internal hemorroids 5. Moderate sized external hemorrhoids.  . ESOPHAGOGASTRODUODENOSCOPY N/A 02/02/2015   SLF: 1. 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 large hiatal hernia 3. mild non-erosive gastritis 4. No source for anemia identified.   Marland Kitchen LEFT HEART CATHETERIZATION WITH CORONARY ANGIOGRAM N/A 09/03/2013   Procedure: LEFT HEART CATHETERIZATION WITH CORONARY ANGIOGRAM;  Surgeon: Jettie Booze, MD;  Location: [facility] CATH LAB;  Service: Cardiovascular;  Laterality: N/A;  . Right carpal tunnel release      Current Outpatient Medications  Medication Sig Dispense Refill  . acetaminophen (TYLENOL) 325 MG tablet Take 650 mg by mouth  every 6 (six) hours as needed for moderate pain.    Marland Kitchen AIMOVIG 70 MG/ML SOAJ Inject 70 mg into the skin every 30 (thirty) days. 1 pen 11  . apixaban (ELIQUIS) 5 MG TABS tablet Take 1 tablet (5 mg total) by mouth 2 (two) times daily. 180 tablet 3  . atenolol (TENORMIN) 50 MG tablet Take 1 tablet by mouth once daily 90 tablet 0  . atorvastatin (LIPITOR) 80 MG tablet Take 1 tablet (80 mg total) by mouth daily. 90  tablet 3  . fluticasone (FLONASE) 50 MCG/ACT nasal spray Place 2 sprays into both nostrils daily.    . Iron-FA-B Cmp-C-Biot-Probiotic (FUSION PLUS) CAPS Take 2 tablets by mouth daily. 60 capsule 3  . lisinopril (ZESTRIL) 20 MG tablet Take 1 tablet by mouth once daily 90 tablet 0  . metFORMIN (GLUCOPHAGE) 500 MG tablet Take 1 tablet (500 mg total) by mouth 2 (two) times daily with a meal. 180 tablet 3  . metoCLOPramide (REGLAN) 10 MG tablet Take 1 tablet (10 mg total) by mouth every 8 (eight) hours as needed for up to 7 days for nausea (Headache). 21 tablet 0  . ondansetron (ZOFRAN ODT) 4 MG disintegrating tablet Take 1 tablet (4 mg total) by mouth every 8 (eight) hours as needed. (Patient taking differently: Take 4 mg by mouth every 8 (eight) hours as needed for nausea or vomiting. ) 20 tablet 6  . pantoprazole (PROTONIX) 40 MG tablet Take 1 tablet by mouth twice daily 180 tablet 0  . Semaglutide 7 MG TABS Take 7 mg by mouth daily before breakfast. 30 tablet 0  . SUMAtriptan (IMITREX) 6 MG/0.5ML SOLN injection Inject 0.5 mLs (6 mg total) into the skin every 2 (two) hours as needed for migraine or headache. May repeat in 2 hours if headache persists or recurs. 6 mL 6  . tiZANidine (ZANAFLEX) 4 MG tablet Take 1 tablet (4 mg total) by mouth every 6 (six) hours as needed for muscle spasms. 30 tablet 0  . topiramate (TOPAMAX) 50 MG tablet Take 1 tablet at bedtime (Patient taking differently: Take 50 mg by mouth at bedtime. ) 90 tablet 3  . Vitamin D, Ergocalciferol, (DRISDOL) 1.25 MG (50000 UNIT) CAPS capsule Take 1 capsule (50,000 Units total) by mouth every 7 (seven) days. 4 capsule 0   No current facility-administered medications for this visit.   Allergies:  Venlafaxine   Social History: The patient  reports that she quit smoking about 7 years ago. Her smoking use included cigarettes. She smoked 0.30 packs per day. She has never used smokeless tobacco. She reports previous alcohol use. She reports  that she does not use drugs.   Family History: The patient's family history includes Diabetes in her mother; Heart disease in her mother; High Cholesterol in her mother; Hypertension in her brother and mother.   ROS:  Please see the history of present illness. Otherwise, complete review of systems is positive for none.  All other systems are reviewed and negative.   Physical Exam: VS:  BP 140/70   Pulse 82   Ht 5\' 2"  (1.575 m)   Wt 273 lb 6.4 oz (124 kg)   SpO2 97%   BMI 50.01 kg/m , BMI Body mass index is 50.01 kg/m.  Wt Readings from Last 3 Encounters:  10/01/19 273 lb 6.4 oz (124 kg)  08/21/19 277 lb (125.6 kg)  08/20/19 274 lb (124.3 kg)    General: Patient appears comfortable at rest. Neck: Supple, no elevated JVP  or carotid bruits, no thyromegaly. Lungs: Clear to auscultation, nonlabored breathing at rest. Cardiac: regular rate and rhythm, no S3 or significant systolic murmur, no pericardial rub. Extremities: No pitting edema, distal pulses 2+. Skin: Warm and dry. Musculoskeletal: No kyphosis. Neuropsychiatric: Alert and oriented x3, affect grossly appropriate.  ECG:  An ECG dated 07/17/2019 was personally reviewed today and demonstrated:  Sinus rhythm rate of 66, T wave abnormalities, poor R wave progression.  Recent Labwork: 06/03/2019: Magnesium 1.6 07/17/2019: TSH 2.210 08/21/2019: ALT 8; AST 10; BUN 17; Creat 0.92; Hemoglobin 11.5; Platelets 317; Potassium 4.0; Sodium 142     Component Value Date/Time   CHOL 145 08/21/2019 0843   TRIG 72 08/21/2019 0843   HDL 33 (L) 08/21/2019 0843   CHOLHDL 4.4 08/21/2019 0843   VLDL 13 08/20/2015 0935   LDLCALC 96 08/21/2019 0843    Other Studies Reviewed Today:   Echocardiogram: 03/2019 IMPRESSIONS   1. Left ventricular ejection fraction, by visual estimation, is 70 to 75%. The left ventricle has hyperdynamic function. There is moderately increased left ventricular hypertrophy with asymmetric septal hypertrophy and  mild LVOT gradient with Valsalva. 2. Left ventricular diastolic parameters are consistent with Grade I diastolic dysfunction (impaired relaxation). 3. Global right ventricle has normal systolic function.The right ventricular size is normal. Mildly increased right ventricular wall thickness. 4. Left atrial size was normal. 5. Right atrial size was normal. 6. Mild to moderate aortic valve annular calcification. 7. The mitral valve is grossly normal. Mild mitral valve regurgitation. 8. The tricuspid valve is grossly normal. Tricuspid valve regurgitation is mild. 9. The aortic valve is tricuspid. Aortic valve regurgitation is not visualized. 10. The pulmonic valve was grossly normal. Pulmonic valve regurgitation is trivial. 11. Normal pulmonary artery systolic pressure. 12. The inferior vena cava is normal in size with greater than 50% respiratory variability, suggesting right atrial pressure of 3 mmHg.   NST: 03/2019  Equivocal ST segment changes following Lexiscan in the setting of baseline inferolateral ST-T wave abnormalities and increased voltage.  Small, mild intensity, inferior apical and mid inferolateral defects that are partially reversible in the setting of breast tissue attenuation. Consider variable soft tissue attenuation, although cannot completely exclude small, mild ischemic territories.  This is a low risk study.  Nuclear stress EF: 65%.   Assessment and Plan:  1. PAF (paroxysmal atrial fibrillation) (HCC)   2. Chest pain, unspecified type   3. Essential hypertension, benign   4. Hyperlipidemia, unspecified hyperlipidemia type   5. Obstructive sleep apnea    1. PAF (paroxysmal atrial fibrillation) (HCC) Heart rate is regular today with a rate of 82.  Denies any palpitations or arrhythmias.  Continue Eliquis p.o. 5 mg twice daily.  Continue atenolol 50 mg daily.  2. Chest pain, unspecified type Denies any anginal symptoms.  3. Essential hypertension,  benign Blood pressure elevated at 140/70.  Please increase lisinopril to 40 mg.  Get BMP and magnesium in 2 weeks.  4. Hyperlipidemia, unspecified hyperlipidemia type Lipids on 08/21/2019 showed total cholesterol of 145, HDL 33, triglycerides 72, LDL 96.  Continue high intensity statin medication with atorvastatin 80 mg daily.  5. Obstructive sleep apnea On CPAP and compliant.  Medication Adjustments/Labs and Tests Ordered: Current medicines are reviewed at length with the patient today.  Concerns regarding medicines are outlined above.   Disposition: Follow-up with Dr. Domenic Polite or APP 3 months  Signed, Levell July, NP 10/01/2019 8:43 AM    [facility] [place]  [address], [place] [address] Phone: [phone]; Fax: [phone]

## 2019-10-01 ENCOUNTER — Encounter: Payer: Self-pay | Admitting: Family Medicine

## 2019-10-01 ENCOUNTER — Other Ambulatory Visit: Payer: Self-pay

## 2019-10-01 ENCOUNTER — Ambulatory Visit: Payer: BC Managed Care – PPO | Admitting: Family Medicine

## 2019-10-01 VITALS — BP 140/70 | HR 82 | Ht 62.0 in | Wt 273.4 lb

## 2019-10-01 DIAGNOSIS — E785 Hyperlipidemia, unspecified: Secondary | ICD-10-CM

## 2019-10-01 DIAGNOSIS — R079 Chest pain, unspecified: Secondary | ICD-10-CM

## 2019-10-01 DIAGNOSIS — I10 Essential (primary) hypertension: Secondary | ICD-10-CM | POA: Diagnosis not present

## 2019-10-01 DIAGNOSIS — I480 Paroxysmal atrial fibrillation: Secondary | ICD-10-CM | POA: Diagnosis not present

## 2019-10-01 DIAGNOSIS — G4733 Obstructive sleep apnea (adult) (pediatric): Secondary | ICD-10-CM

## 2019-10-01 MED ORDER — LISINOPRIL 40 MG PO TABS: 40.0000 mg | ORAL_TABLET | Freq: Every day | ORAL | 3 refills | Status: DC

## 2019-10-01 NOTE — Patient Instructions (Signed)
Medication Instructions:  Increase Lisinopril to 40 mg daily .  *If you need a refill on your cardiac medications before your next appointment, please call your pharmacy*   Lab Work: Your physician recommends that you return for lab work in: 2 weeks   If you have labs (blood work) drawn today and your tests are completely normal, you will receive your results only by: Marland Kitchen MyChart Message (if you have MyChart) OR . A paper copy in the mail If you have any lab test that is abnormal or we need to change your treatment, we will call you to review the results.   Testing/Procedures: None ordered    Follow-Up: At [facility], you and your health needs are our priority.  As part of our continuing mission to provide you with exceptional heart care, we have created designated Provider Care Teams.  These Care Teams include your primary Cardiologist (physician) and Advanced Practice Providers (APPs -  Physician Assistants and Nurse Practitioners) who all work together to provide you with the care you need, when you need it.  We recommend signing up for the patient portal called "MyChart".  Sign up information is provided on this After Visit Summary.  MyChart is used to connect with patients for Virtual Visits (Telemedicine).  Patients are able to view lab/test results, encounter notes, upcoming appointments, etc.  Non-urgent messages can be sent to your provider as well.   To learn more about what you can do with MyChart, go to [email or web address].    Your next appointment:   3 month(s)  The format for your next appointment:   In Person  Provider:   You may see Rozann Lesches, MD or one of the following Advanced Practice Providers on your designated Care Team:    Bernerd Pho, PA-C   Ermalinda Barrios, [place]   Levell July, NP    Other Instructions None

## 2019-10-02 ENCOUNTER — Ambulatory Visit: Payer: BC Managed Care – PPO | Admitting: Family Medicine

## 2019-10-02 ENCOUNTER — Encounter: Payer: Self-pay | Admitting: Family Medicine

## 2019-10-02 VITALS — BP 140/82 | HR 78 | Temp 98.3°F | Resp 16 | Ht 62.0 in | Wt 272.0 lb

## 2019-10-02 DIAGNOSIS — G629 Polyneuropathy, unspecified: Secondary | ICD-10-CM

## 2019-10-02 DIAGNOSIS — R590 Localized enlarged lymph nodes: Secondary | ICD-10-CM | POA: Diagnosis not present

## 2019-10-02 DIAGNOSIS — J302 Other seasonal allergic rhinitis: Secondary | ICD-10-CM | POA: Diagnosis not present

## 2019-10-02 DIAGNOSIS — J019 Acute sinusitis, unspecified: Secondary | ICD-10-CM | POA: Diagnosis not present

## 2019-10-02 MED ORDER — AMOXICILLIN 875 MG PO TABS: 875.0000 mg | ORAL_TABLET | Freq: Two times a day (BID) | ORAL | 0 refills | Status: DC

## 2019-10-02 MED ORDER — PREDNISONE 20 MG PO TABS
20.0000 mg | ORAL_TABLET | Freq: Every day | ORAL | 0 refills | Status: DC
Start: 2019-10-02 — End: 2019-10-14

## 2019-10-02 NOTE — Progress Notes (Signed)
Subjective:    Patient ID: Monique Gomez, female    DOB: Gomez 28, 1957, 64 y.o.   MRN: [id number]  Patient presents for Lymphadenopathy   Over the weekend she had severe problem with her allergies and was very stopped up.She tried using nebulizer  She took mucinex and finally zyrtec and it helped but still has pressure across cheeks, discomfort on face, swelling in same area, and sinus drainage. No fever   Seen by cardiology yesterday, lisinopril was increased to 40mg  once a day due to her BP and SOB episodes   Has history of recurrent lymph nodes in the cervical region.  She had one large cluster back in the fall that prompted ultrasound and CT of neck.  This was followed up again about a month later was found to be reactive nodes which had shrunken.  She had a repeat scan done in February because of prominent nodes still labeled as benign.  She has now been having intermittent lymph nodes popup which she has been concerned about.  Her blood work has not showed any abnormalities.  SHe also states that she gets a tingling sensation in her feet and sometimes he would just feel cold.  Is been going on for quite some time she is not sure if it was related to the Topamax that she has had other paresthesias with the Topamax.   Review Of Systems:  GEN- denies fatigue, fever, weight loss,weakness, recent illness HEENT- denies eye drainage, change in vision, nasal discharge, CVS- denies chest pain, palpitations RESP- denies SOB, cough, wheeze ABD- denies N/V, change in stools, abd pain GU- denies dysuria, hematuria, dribbling, incontinence MSK- denies joint pain, muscle aches, injury Neuro- denies headache, dizziness, syncope, seizure activity       Objective:    BP 140/82   Pulse 78   Temp 98.3 F (36.8 C) (Temporal)   Resp 16   Ht 5\' 2"  (1.575 m)   Wt 272 lb (123.4 kg)   SpO2 97%   BMI 49.75 kg/m  GEN- NAD, alert and oriented x3 HEENT- PERRL, EOMI, non injected sclera, pink  conjunctiva, MMM, oropharynx clear, TM clear bilat no effusion + maxillary sinus tenderness, R > L, nares enlarged turbinates  Neck- Supple, no thyromegaly,left supraclavicular node NT ,shotty right ant cervical nodes  CVS- RRR, no murmur RESP-CTAB EXT- No edema Pulses- Radial 2+, PT/DP 2+  Neuro-CNII-XII in tact, normal monofilament       Assessment & Plan:      Problem List Items Addressed This Visit    None    Visit Diagnoses    Acute rhinosinusitis    -  Primary   Treat for acute sinus infection with amox, prednisone for chronic allergies/inflammation, zyrtec and flonase   Relevant Medications   predniSONE (DELTASONE) 20 MG tablet   amoxicillin (AMOXIL) 875 MG tablet   Cervical lymphadenopathy       current increased nodes likley related to infection but has has intermittant prominent nodes for 6 months now, will get consult with heme/onc.None have been labeled concerning enough for biopsy   Relevant Orders   Ambulatory referral to Hematology / Oncology   Neuropathy       mild neuropathy symptoms, blood flow in tact, she can try B12 supplement, she wants to continue topamax for now, caused similar symptoms in hand/face but controls migraines  Also follows with neurology    Seasonal allergies          Note: This dictation was prepared with  Dragon dictation along with smaller Company secretary. Any transcriptional errors that result from this process are unintentional.

## 2019-10-02 NOTE — Patient Instructions (Addendum)
Zyrtec once a day flonase Take prednisone for inflammation  Take antibiotics  Referral to oncology  F/U as previous

## 2019-10-14 ENCOUNTER — Other Ambulatory Visit: Payer: Self-pay

## 2019-10-14 ENCOUNTER — Encounter (HOSPITAL_COMMUNITY): Payer: Self-pay

## 2019-10-14 NOTE — Progress Notes (Signed)
[facility] [address] [place] [address]   CLINIC:  Medical Oncology/Hematology  CONSULT NOTE  Patient Care Team: [facility], Modena Nunnery, MD as PCP - General (Family Medicine) Satira Sark, MD as PCP - Cardiology (Cardiology) Danie Binder, MD (Inactive) as Consulting Physician (Gastroenterology)  CHIEF COMPLAINTS/PURPOSE OF CONSULTATION:  Cervical lymphadenopathy  HISTORY OF PRESENTING ILLNESS:  [name] 64 y.o. female is here because of evaluation of cervical lymphadenopathy, at the request of Dr. Vic Blackbird from [place].  She is accompanied by her daughter. She has been having prominent right anterior cervical and left supraclavicular lymph nodes which have been intermittent for the past 6 months. She reports that she had 1 [place] and 3 CT scans done and the nodes have decreased slightly in size, and afterwards she completed an "aggressive antibiotic treatment" which helped the lymph nodes decrease in size slightly. She denies having any other infections except frequent sinus infections which are treated with antibiotics. She denies F/C/night sweats or unexpected weight loss.   She is a former smoker, quitting in 2013 after smoking 1 pack per week for 35 years. She works as a Education officer, museum for [employer]. She denies knowledge of cancer on either side of her family. She takes Eliquis for a-fib.   MEDICAL HISTORY:  Past Medical History:  Diagnosis Date   A-fib [facility])    Abdominal fibromatosis    Allergic rhinitis    Anemia    Asthma    Diabetes ([place])    Edema of both lower extremities    Essential hypertension    GERD (gastroesophageal reflux disease)    Headache(784.0)    History of cardiac catheterization    No significant CAD May 2015   History of migraine headaches    Hyperlipidemia    Migraines    Obesity    PAF (paroxysmal atrial fibrillation) ([place]) 08/2013   Sleep apnea     Type 2 diabetes mellitus ([place])     SURGICAL HISTORY: Past Surgical History:  Procedure Laterality Date   CATARACT EXTRACTION     CATARACT EXTRACTION W/PHACO Right 04/05/2019   Procedure: CATARACT EXTRACTION PHACO AND INTRAOCULAR LENS PLACEMENT RIGHT EYE (CDE: 3.19);  Surgeon: Baruch Goldmann, MD;  Location: [facility];  Service: Ophthalmology;  Laterality: Right;   COLONOSCOPY N/A 02/02/2015   SLF: 1. one colon polyp removed-no source for anemia identified. 2. moderate diverticulosis noted in the sigmoid colon and descending colon 3. the left colon is redundant 4. Rectal bleeding due ot small internal hemorroids 5. Moderate sized external hemorrhoids.   ESOPHAGOGASTRODUODENOSCOPY N/A 02/02/2015   SLF: 1. 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 large hiatal hernia 3. mild non-erosive gastritis 4. No source for anemia identified.    LEFT HEART CATHETERIZATION WITH CORONARY ANGIOGRAM N/A 09/03/2013   Procedure: LEFT HEART CATHETERIZATION WITH CORONARY ANGIOGRAM;  Surgeon: Jettie Booze, MD;  Location: [facility] CATH LAB;  Service: Cardiovascular;  Laterality: N/A;   Right carpal tunnel release      SOCIAL HISTORY: Social History   Socioeconomic History   Marital status: Single    Spouse name: Not on file   Number of children: 2   Years of education: Not on file   Highest education level: Not on file  Occupational History   Occupation: Social worker    Employer: [employer]  Tobacco Use   Smoking status: Former Smoker    Packs/day: 0.30    Types:  Cigarettes    Quit date: 04/01/2012    Years since quitting: 7.5   Smokeless tobacco: Never Used  Vaping Use   Vaping Use: Never used  Substance and Sexual Activity   Alcohol use: Yes    Alcohol/week: 0.0 standard drinks    Comment: very occasional   Drug use: No   Sexual activity: Yes  Other Topics Concern   Not on file  Social History Narrative   Not on file   Social Determinants of Health    Financial Resource Strain:    Difficulty of Paying Living Expenses:   Food Insecurity:    Worried About Charity fundraiser in the Last Year:    Arboriculturist in the Last Year:   Transportation Needs:    Film/video editor (Medical):    Lack of Transportation (Non-Medical):   Physical Activity:    Days of Exercise per Week:    Minutes of Exercise per Session:   Stress:    Feeling of Stress :   Social Connections:    Frequency of Communication with Friends and Family:    Frequency of Social Gatherings with Friends and Family:    Attends Religious Services:    Active Member of Clubs or Organizations:    Attends Music therapist:    Marital Status:   Intimate Partner Violence:    Fear of Current or Ex-Partner:    Emotionally Abused:    Physically Abused:    Sexually Abused:     FAMILY HISTORY: Family History  Problem Relation Age of Onset   Diabetes Mother    Hypertension Mother    Heart disease Mother    High Cholesterol Mother    Alzheimer's disease Father    Hypertension Sister    Hypertension Brother    Diabetes Brother    Arthritis Sister    Hypertension Sister    High Cholesterol Sister    Diabetes Son    Developmental delay Son    Colon cancer Neg Hx     ALLERGIES:  is allergic to venlafaxine.  MEDICATIONS:  Current Outpatient Medications  Medication Sig Dispense Refill   acetaminophen (TYLENOL) 325 MG tablet Take 650 mg by mouth every 6 (six) hours as needed for moderate pain. (Patient not taking: Reported on 10/14/2019)     AIMOVIG 70 MG/ML SOAJ Inject 70 mg into the skin every 30 (thirty) days. 1 pen 11   apixaban (ELIQUIS) 5 MG TABS tablet Take 1 tablet (5 mg total) by mouth 2 (two) times daily. 180 tablet 3   atenolol (TENORMIN) 50 MG tablet Take 1 tablet by mouth once daily 90 tablet 0   atorvastatin (LIPITOR) 80 MG tablet Take 1 tablet (80 mg total) by mouth daily. 90 tablet 3   fluticasone  (FLONASE) 50 MCG/ACT nasal spray Place 2 sprays into both nostrils daily.     Iron-FA-B Cmp-C-Biot-Probiotic (FUSION PLUS) CAPS Take 2 tablets by mouth daily. 60 capsule 3   lisinopril (ZESTRIL) 40 MG tablet Take 1 tablet (40 mg total) by mouth daily. 90 tablet 3   metFORMIN (GLUCOPHAGE) 500 MG tablet Take 1 tablet (500 mg total) by mouth 2 (two) times daily with a meal. 180 tablet 3   metoCLOPramide (REGLAN) 10 MG tablet Take 1 tablet (10 mg total) by mouth every 8 (eight) hours as needed for up to 7 days for nausea (Headache). (Patient not taking: Reported on 10/14/2019) 21 tablet 0   ondansetron (ZOFRAN ODT) 4 MG disintegrating tablet  Take 1 tablet (4 mg total) by mouth every 8 (eight) hours as needed. (Patient not taking: Reported on 10/14/2019) 20 tablet 6   pantoprazole (PROTONIX) 40 MG tablet Take 1 tablet by mouth twice daily 180 tablet 0   Semaglutide 7 MG TABS Take 7 mg by mouth daily before breakfast. 30 tablet 0   SUMAtriptan (IMITREX) 6 MG/0.5ML SOLN injection Inject 0.5 mLs (6 mg total) into the skin every 2 (two) hours as needed for migraine or headache. May repeat in 2 hours if headache persists or recurs. (Patient not taking: Reported on 10/14/2019) 6 mL 6   tiZANidine (ZANAFLEX) 4 MG tablet Take 1 tablet (4 mg total) by mouth every 6 (six) hours as needed for muscle spasms. (Patient not taking: Reported on 10/14/2019) 30 tablet 0   topiramate (TOPAMAX) 50 MG tablet Take 1 tablet at bedtime (Patient taking differently: Take 50 mg by mouth at bedtime. ) 90 tablet 3   Vitamin D, Ergocalciferol, (DRISDOL) 1.25 MG (50000 UNIT) CAPS capsule Take 1 capsule (50,000 Units total) by mouth every 7 (seven) days. 4 capsule 0   No current facility-administered medications for this visit.    REVIEW OF SYSTEMS:   Review of Systems  Constitutional: Negative for appetite change, chills, diaphoresis, fatigue, fever and unexpected weight change.  Respiratory: Positive for cough and shortness  of breath.   Cardiovascular: Positive for chest pain (chest tightness).  Gastrointestinal: Positive for constipation and diarrhea.  Neurological: Positive for headaches and numbness (hands & feet).  Hematological: Positive for adenopathy.  Psychiatric/Behavioral: Positive for depression. The patient is nervous/anxious.   All other systems reviewed and are negative.    PHYSICAL EXAMINATION: ECOG PERFORMANCE STATUS: 0 - Asymptomatic  Vitals:   10/15/19 0806  BP: (!) 142/84  Pulse: 77  Resp: 18  Temp: (!) 96.9 F (36.1 C)  SpO2: 100%   Filed Weights   10/15/19 0806  Weight: 274 lb 11.2 oz (124.6 kg)   Physical Exam Vitals reviewed.  Constitutional:      Appearance: Normal appearance. She is obese.  HENT:     Mouth/Throat:     Lips: No lesions.     Mouth: No oral lesions.     Tongue: No lesions.  Cardiovascular:     Rate and Rhythm: Normal rate and regular rhythm.     Pulses: Normal pulses.     Heart sounds: Normal heart sounds.  Pulmonary:     Effort: Pulmonary effort is normal.     Breath sounds: Normal breath sounds.  Abdominal:     Palpations: Abdomen is soft. There is no hepatomegaly, splenomegaly or mass.     Tenderness: There is no abdominal tenderness.     Hernia: No hernia is present.  Musculoskeletal:     Right lower leg: No edema.     Left lower leg: No edema.  Lymphadenopathy:     Cervical: Cervical adenopathy present.     Right cervical: No superficial cervical adenopathy.    Left cervical: No superficial cervical adenopathy.     Upper Body:     Right upper body: Supraclavicular adenopathy (sub-cm LN) present. No axillary adenopathy.     Left upper body: Supraclavicular adenopathy (sub-cm LN) present. No axillary adenopathy.     Lower Body: No right inguinal adenopathy. No left inguinal adenopathy.  Neurological:     General: No focal deficit present.     Mental Status: She is alert and oriented to person, place, and time.  Psychiatric:  Mood and Affect: Mood normal.        Behavior: Behavior normal.      LABORATORY DATA:  I have reviewed the data as listed CBC Latest Ref Rng & Units 08/21/2019 07/17/2019 02/12/2019  WBC 3.8 - 10.8 Thousand/uL 9.0 11.1(H) 8.5  Hemoglobin 11.7 - 15.5 g/dL 11.5(L) 11.2 11.5(L)  Hematocrit 35 - 45 % 38.3 37.2 38.8  Platelets 140 - 400 Thousand/uL 317 352 286   CMP Latest Ref Rng & Units 08/21/2019 06/03/2019 02/12/2019  Glucose 65 - 99 mg/dL 101(H) 103(H) 106(H)  BUN 7 - 25 mg/dL 17 21 14   Creatinine 0.50 - 0.99 mg/dL 0.92 0.84 0.82  Sodium 135 - 146 mmol/L 142 141 138  Potassium 3.5 - 5.3 mmol/L 4.0 3.9 3.9  Chloride 98 - 110 mmol/L 110 109 108  CO2 20 - 32 mmol/L 24 24 23   Calcium 8.6 - 10.4 mg/dL 9.7 9.8 8.9  Total Protein 6.1 - 8.1 g/dL 6.9 7.3 7.8  Total Bilirubin 0.2 - 1.2 mg/dL 0.3 0.2 0.3  Alkaline Phos 38 - 126 U/L - - 77  AST 10 - 35 U/L 10 9(L) 13(L)  ALT 6 - 29 U/L 8 8 13     RADIOGRAPHIC STUDIES: I have personally reviewed the radiological images as listed and agreed with the findings in the report.  ASSESSMENT:  1.  Subcentimeter left supraclavicular adenopathy: -She reported lymphadenopathy for the past 6 months.  She reported onset of sinus infections. -Ultrasound soft tissue neck on 01/25/2019 shows left supraclavicular lymph node measuring 1 cm.  A CT of the soft tissue of the neck on the same day showed 3 small supraclavicular lymph nodes, largest measuring 8-9 mm.  Small posterior triangle lymph node, none more than 9 mm.  Consistent with mild reactive adenitis. -CT soft tissue neck with contrast on 02/22/2019 showed right level 2 lymph node measuring 6 mm unchanged.  Subcentimeter posterior triangle lymph nodes on the right unchanged.  Largest measures 8 mm.  Left level 2 lymph node measures 8.6 mm unchanged.  Cluster of supraclavicular lymph nodes on the left also slightly smaller.  Largest measures 5 mm.  No oropharyngeal masses. -CT soft tissue neck on 06/17/2019  shows left level 2 lymph node measuring 1.2 cm, previously 1.1 cm.  Stable cluster of nonenlarged left supraclavicular lymph nodes. -She smoked 1 pack of cigarettes per week for 35 years.  She quit smoking in 2013.   PLAN:  1.  Waxing and waning neck and supraclavicular adenopathy: -Today's examination revealed subcentimeter lymph nodes in the bilateral supraclavicular regions.  No significant neck adenopathy. -She does not have any B symptoms including fevers, drenching night sweats or weight loss.  Minor hot flashes present.  No palpable hepatosplenomegaly. -The lymph nodes have been waxing and waning and more or less stable since September last year indicating a benign process. -We have checked her LDH which was within normal limits. -I have recommended follow-up in 3 months.  If there is any worsening, she was told to call us sooner.  We will consider doing a PET CT scan if there is any significant changes.  2.  Microcytosis: -CBC on 08/21/2019 shows hemoglobin 11.5 with MCV of 73.4. 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 -We will check hemoglobin electrophoresis.   All questions were answered. The patient knows to call the clinic with any problems, questions or concerns.   Derek Jack, MD, 10/15/19 8:24 AM  [place] [phone]   I,  Milinda Antis, am acting as a scribe for Dr. Sanda Linger.  I, Derek Jack MD, have reviewed the above documentation for accuracy and completeness, and I agree with the above.

## 2019-10-15 ENCOUNTER — Encounter (HOSPITAL_COMMUNITY): Payer: Self-pay | Admitting: Hematology

## 2019-10-15 ENCOUNTER — Other Ambulatory Visit (HOSPITAL_COMMUNITY)
Admission: RE | Admit: 2019-10-15 | Discharge: 2019-10-15 | Disposition: A | Discharge status: Home / Self Care | Payer: BC Managed Care – PPO | Source: Ambulatory Visit | Attending: Family Medicine | Admitting: Family Medicine

## 2019-10-15 ENCOUNTER — Inpatient Hospital Stay (HOSPITAL_COMMUNITY): Payer: BC Managed Care – PPO | Attending: Hematology | Admitting: Hematology

## 2019-10-15 ENCOUNTER — Inpatient Hospital Stay (HOSPITAL_COMMUNITY): Payer: BC Managed Care – PPO

## 2019-10-15 ENCOUNTER — Ambulatory Visit (HOSPITAL_COMMUNITY): Payer: BC Managed Care – PPO | Admitting: Hematology

## 2019-10-15 VITALS — BP 142/84 | HR 77 | Temp 96.9°F | Resp 18 | Ht 63.0 in | Wt 274.7 lb

## 2019-10-15 DIAGNOSIS — R7989 Other specified abnormal findings of blood chemistry: Secondary | ICD-10-CM | POA: Diagnosis not present

## 2019-10-15 DIAGNOSIS — R591 Generalized enlarged lymph nodes: Secondary | ICD-10-CM

## 2019-10-15 DIAGNOSIS — Z87891 Personal history of nicotine dependence: Secondary | ICD-10-CM

## 2019-10-15 DIAGNOSIS — I10 Essential (primary) hypertension: Secondary | ICD-10-CM | POA: Insufficient documentation

## 2019-10-15 DIAGNOSIS — I480 Paroxysmal atrial fibrillation: Secondary | ICD-10-CM | POA: Diagnosis not present

## 2019-10-15 DIAGNOSIS — R232 Flushing: Secondary | ICD-10-CM | POA: Diagnosis not present

## 2019-10-15 DIAGNOSIS — R590 Localized enlarged lymph nodes: Secondary | ICD-10-CM

## 2019-10-15 LAB — CBC WITH DIFFERENTIAL/PLATELET
Abs Immature Granulocytes: 0.04 10*3/uL (ref 0.00–0.07)
Basophils Absolute: 0.1 10*3/uL (ref 0.0–0.1)
Basophils Relative: 1 %
Eosinophils Absolute: 0.2 10*3/uL (ref 0.0–0.5)
Eosinophils Relative: 2 %
HCT: 37.5 % (ref 36.0–46.0)
Hemoglobin: 11 g/dL — ABNORMAL LOW (ref 12.0–15.0)
Immature Granulocytes: 1 %
Lymphocytes Relative: 23 %
Lymphs Abs: 2 10*3/uL (ref 0.7–4.0)
MCH: 21.6 pg — ABNORMAL LOW (ref 26.0–34.0)
MCHC: 29.3 g/dL — ABNORMAL LOW (ref 30.0–36.0)
MCV: 73.7 fL — ABNORMAL LOW (ref 80.0–100.0)
Monocytes Absolute: 0.8 10*3/uL (ref 0.1–1.0)
Monocytes Relative: 9 %
Neutro Abs: 5.8 10*3/uL (ref 1.7–7.7)
Neutrophils Relative %: 64 %
Platelets: 352 10*3/uL (ref 150–400)
RBC: 5.09 MIL/uL (ref 3.87–5.11)
RDW: 16.4 % — ABNORMAL HIGH (ref 11.5–15.5)
WBC: 8.8 10*3/uL (ref 4.0–10.5)
nRBC: 0 % (ref 0.0–0.2)

## 2019-10-15 LAB — BASIC METABOLIC PANEL
Anion gap: 9 (ref 5–15)
BUN: 15 mg/dL (ref 8–23)
CO2: 26 mmol/L (ref 22–32)
Calcium: 9.4 mg/dL (ref 8.9–10.3)
Chloride: 107 mmol/L (ref 98–111)
Creatinine, Ser: 0.85 mg/dL (ref 0.44–1.00)
GFR calc Af Amer: >60 mL/min (ref >60)
GFR calc non Af Amer: >60 mL/min (ref >60)
Glucose, Bld: 121 mg/dL — ABNORMAL HIGH (ref 70–99)
Potassium: 4.3 mmol/L (ref 3.5–5.1)
Sodium: 142 mmol/L (ref 135–145)

## 2019-10-15 LAB — MAGNESIUM: Magnesium: 1.8 mg/dL (ref 1.7–2.4)

## 2019-10-15 LAB — LACTATE DEHYDROGENASE: LDH: 115 U/L (ref 98–192)

## 2019-10-15 NOTE — Patient Instructions (Signed)
[place] at [facility] Discharge Instructions  You were seen today by Dr. Delton Coombes. He went over your recent results. You will have blood drawn for analysis. Please continue your routine care with your primary care provider. Dr. Delton Coombes will see you back in 3 months for labs and follow up.   Thank you for choosing [place] at [facility] to provide your oncology and hematology care.  To afford each patient quality time with our provider, please arrive at least 15 minutes before your scheduled appointment time.   If you have a lab appointment with the [facility] please come in thru the Main Entrance and check in at the main information desk  You need to re-schedule your appointment should you arrive 10 or more minutes late.  We strive to give you quality time with our providers, and arriving late affects you and other patients whose appointments are after yours.  Also, if you no show three or more times for appointments you may be dismissed from the clinic at the providers discretion.     Again, thank you for choosing [facility].  Our hope is that these requests will decrease the amount of time that you wait before being seen by our physicians.       _____________________________________________________________  Should you have questions after your visit to [facility], please contact our office at [phone] between the hours of 8:00 a.m. and 4:30 p.m.  Voicemails left after 4:00 p.m. will not be returned until the following business day.  For prescription refill requests, have your pharmacy contact our office and allow 72 hours.    Cancer Center Support Programs:   > Cancer Support Group  2nd Tuesday of the month 1pm-2pm, Journey Room

## 2019-10-17 ENCOUNTER — Telehealth: Payer: Self-pay | Admitting: *Deleted

## 2019-10-17 LAB — HGB FRACTIONATION CASCADE
Hgb A2: 2.5 % (ref 1.8–3.2)
Hgb A: 97.5 % (ref 96.4–98.8)
Hgb F: 0 % (ref 0.0–2.0)
Hgb S: 0 %

## 2019-10-17 NOTE — Telephone Encounter (Signed)
-----   Message from Verta Ellen., NP sent at 10/15/2019 11:41 AM EDT ----- Please call the patient and tell her her her chemistry looks good.  The only thing I see is her glucose is a little elevated.  Otherwise it looks good.  Thanks

## 2019-10-18 ENCOUNTER — Telehealth: Payer: Self-pay | Admitting: Cardiology

## 2019-10-18 NOTE — Telephone Encounter (Signed)
New message    Patient returning call to [facility]

## 2019-10-18 NOTE — Telephone Encounter (Signed)
Pt would like to know lab results   Please call [phone]    Thanks renee

## 2019-10-18 NOTE — Telephone Encounter (Signed)
Left message to return call 

## 2019-10-21 NOTE — Telephone Encounter (Signed)
Laurine Blazer, LPN  1/84/8592 7:63 PM EDT Back to Top    Notified via detailed voice message. Copy to pcp,      Verta Ellen., NP  10/15/2019 11:41 AM EDT     Please call the patient and tell her her her chemistry looks good. The only thing I see is her glucose is a little elevated. Otherwise it looks good. Thanks

## 2019-10-22 ENCOUNTER — Telehealth: Payer: Self-pay | Admitting: Orthopedic Surgery

## 2019-10-22 NOTE — Telephone Encounter (Signed)
Notified by Lovina Reach on 10/21/19 @5 :06 pm

## 2019-10-22 NOTE — Telephone Encounter (Signed)
Called back to patient as discussed earlier this afternoon to request to see Dr Aline Brochure; called back to schedule; reached patient's voice mail; left message to call back regarding next available (mid-July).

## 2019-11-07 ENCOUNTER — Other Ambulatory Visit: Payer: Self-pay | Admitting: Family Medicine

## 2019-11-13 ENCOUNTER — Encounter: Payer: Self-pay | Admitting: Orthopedic Surgery

## 2019-11-13 ENCOUNTER — Other Ambulatory Visit: Payer: Self-pay

## 2019-11-13 ENCOUNTER — Ambulatory Visit: Payer: BC Managed Care – PPO | Admitting: Orthopedic Surgery

## 2019-11-13 ENCOUNTER — Ambulatory Visit: Payer: BC Managed Care – PPO

## 2019-11-13 VITALS — BP 158/99 | HR 60 | Ht 62.0 in | Wt 280.0 lb

## 2019-11-13 DIAGNOSIS — M542 Cervicalgia: Secondary | ICD-10-CM

## 2019-11-13 DIAGNOSIS — Z6841 Body Mass Index (BMI) 40.0 and over, adult: Secondary | ICD-10-CM | POA: Diagnosis not present

## 2019-11-13 DIAGNOSIS — M7542 Impingement syndrome of left shoulder: Secondary | ICD-10-CM

## 2019-11-13 NOTE — Progress Notes (Addendum)
NEW PROBLEM//OFFICE VISIT  Chief Complaint  Patient presents with  . Neck Pain    On and off for 6 mos.    64 year old female presents with intermittent pain cervical spine for 6 months.  Patient describes pain over the left shoulder and also over the left side of her cervical spine and then also on the right side the cervical spine  Patient is on Eliquis has only tried Tylenol and heat.  She does note some pain when she elevates the left arm   Review of Systems  Constitutional: Negative for chills and fever.  Cardiovascular: Positive for palpitations.  Neurological: Positive for tingling.   Tingling is noted in the hands which she indicates is related to her Topamax  Past Medical History:  Diagnosis Date  . A-fib ([place])   . Abdominal fibromatosis   . Allergic rhinitis   . Anemia   . Asthma   . Diabetes ([place])   . Edema of both lower extremities   . Essential hypertension   . GERD (gastroesophageal reflux disease)   . Headache(784.0)   . History of cardiac catheterization    No significant CAD May 2015  . History of migraine headaches   . Hyperlipidemia   . Migraines   . Obesity   . PAF (paroxysmal atrial fibrillation) ([place]) 08/2013  . Sleep apnea   . Type 2 diabetes mellitus ([place])     Past Surgical History:  Procedure Laterality Date  . CATARACT EXTRACTION    . CATARACT EXTRACTION W/PHACO Right 04/05/2019   Procedure: CATARACT EXTRACTION PHACO AND INTRAOCULAR LENS PLACEMENT RIGHT EYE (CDE: 3.19);  Surgeon: Baruch Goldmann, MD;  Location: [facility];  Service: Ophthalmology;  Laterality: Right;  . COLONOSCOPY N/A 02/02/2015   SLF: 1. one colon polyp removed-no source for anemia identified. 2. moderate diverticulosis noted in the sigmoid colon and descending colon 3. the left colon is redundant 4. Rectal bleeding due ot small internal hemorroids 5. Moderate sized external hemorrhoids.  . ESOPHAGOGASTRODUODENOSCOPY N/A 02/02/2015   SLF: 1. Patent stricture at the  gastroesophageal junction 2. large hiatal hernia 3. mild non-erosive gastritis 4. No source for anemia identified.   Marland Kitchen LEFT HEART CATHETERIZATION WITH CORONARY ANGIOGRAM N/A 09/03/2013   Procedure: LEFT HEART CATHETERIZATION WITH CORONARY ANGIOGRAM;  Surgeon: Jettie Booze, MD;  Location: [facility] CATH LAB;  Service: Cardiovascular;  Laterality: N/A;  . Right carpal tunnel release      Family History  Problem Relation Age of Onset  . Diabetes Mother   . Hypertension Mother   . Heart disease Mother   . High Cholesterol Mother   . Alzheimer's disease Father   . Hypertension Sister   . Hypertension Brother   . Diabetes Brother   . Arthritis Sister   . Hypertension Sister   . High Cholesterol Sister   . Diabetes Son   . Developmental delay Son   . Colon cancer Neg Hx    Social History   Tobacco Use  . Smoking status: Former Smoker    Packs/day: 0.30    Types: Cigarettes    Quit date: 04/01/2012    Years since quitting: 7.6  . Smokeless tobacco: Never Used  Vaping Use  . Vaping Use: Never used  Substance Use Topics  . Alcohol use: Yes    Alcohol/week: 0.0 standard drinks    Comment: very occasional  . Drug use: No    Allergies  Allergen Reactions  . Venlafaxine Nausea And Vomiting and Other (See Comments)  Dizziness, shakiness    Current Meds  Medication Sig  . acetaminophen (TYLENOL) 325 MG tablet Take 650 mg by mouth every 6 (six) hours as needed for moderate pain.   Marland Kitchen AIMOVIG 70 MG/ML SOAJ Inject 70 mg into the skin every 30 (thirty) days.  Marland Kitchen apixaban (ELIQUIS) 5 MG TABS tablet Take 1 tablet (5 mg total) by mouth 2 (two) times daily.  Marland Kitchen atenolol (TENORMIN) 50 MG tablet Take 1 tablet by mouth once daily  . atorvastatin (LIPITOR) 80 MG tablet Take 1 tablet (80 mg total) by mouth daily.  . fluticasone (FLONASE) 50 MCG/ACT nasal spray Place 2 sprays into both nostrils daily.  . Iron-FA-B Cmp-C-Biot-Probiotic (FUSION PLUS) CAPS Take 2 tablets by mouth daily.  Marland Kitchen  lisinopril (ZESTRIL) 40 MG tablet Take 1 tablet (40 mg total) by mouth daily.  . metFORMIN (GLUCOPHAGE) 500 MG tablet Take 1 tablet (500 mg total) by mouth 2 (two) times daily with a meal.  . metoCLOPramide (REGLAN) 10 MG tablet Take 1 tablet (10 mg total) by mouth every 8 (eight) hours as needed for up to 7 days for nausea (Headache).  . ondansetron (ZOFRAN ODT) 4 MG disintegrating tablet Take 1 tablet (4 mg total) by mouth every 8 (eight) hours as needed.  . pantoprazole (PROTONIX) 40 MG tablet Take 1 tablet by mouth twice daily  . Semaglutide 7 MG TABS Take 7 mg by mouth daily before breakfast.  . SUMAtriptan (IMITREX) 6 MG/0.5ML SOLN injection Inject 0.5 mLs (6 mg total) into the skin every 2 (two) hours as needed for migraine or headache. May repeat in 2 hours if headache persists or recurs.  Marland Kitchen tiZANidine (ZANAFLEX) 4 MG tablet Take 1 tablet (4 mg total) by mouth every 6 (six) hours as needed for muscle spasms.  Marland Kitchen topiramate (TOPAMAX) 50 MG tablet Take 1 tablet at bedtime (Patient taking differently: Take 50 mg by mouth at bedtime. )  . Vitamin D, Ergocalciferol, (DRISDOL) 1.25 MG (50000 UNIT) CAPS capsule Take 1 capsule (50,000 Units total) by mouth every 7 (seven) days.    BP (!) 158/99   Pulse 60   Ht 5\' 2"  (1.575 m)   Wt 280 lb (127 kg)   BMI 51.21 kg/m   Physical Exam Constitutional:      General: She is not in acute distress.    Appearance: She is well-developed. She is obese.  Cardiovascular:     Comments: No peripheral edema Skin:    General: Skin is warm and dry.  Neurological:     Mental Status: She is alert and oriented to person, place, and time.     Sensory: No sensory deficit.     Coordination: Coordination normal.     Gait: Gait normal.     Deep Tendon Reflexes: Reflexes are normal and symmetric.     Back Exam   Tenderness  The patient is experiencing tenderness in the cervical.  Range of Motion  Extension: normal  Flexion: normal   Reflexes  Biceps:  normal  Other  Gait: normal  Erythema: no back redness Scars: absent   Left Shoulder Exam   Tenderness  Left shoulder tenderness location: posterior left shoulder   Range of Motion  Active abduction: normal  Passive abduction: normal  Extension: normal  External rotation: normal  Forward flexion: 130   Muscle Strength  Abduction: 5/5  Internal rotation: 5/5  External rotation: 5/5  Supraspinatus: 5/5  Subscapularis: 5/5  Biceps: 5/5   Tests  Apprehension: negative [place] test:  negative Cross arm: negative Impingement: positive Drop arm: negative Sulcus: absent  Other  Erythema: present Scars: absent Sensation: none Pulse: present       The patient meets the AMA guidelines for Morbid (severe) obesity with a BMI > 40.0 and I have recommended weight loss.   MEDICAL DECISION MAKING  A.  Encounter Diagnoses  Name Primary?  . Neck pain   . Shoulder impingement, left Yes  . Body mass index 50.0-59.9, adult ([place])   . Morbid obesity ([place])     B. DATA ANALYSED:    IMAGING: Independent interpretation of images: c spine c5-6 DJD   Orders: PT  Outside records reviewed: none  C. MANAGEMENT   SAI left shoulder  Procedure note the subacromial injection shoulder left   Verbal consent was obtained to inject the  Left   Shoulder  Timeout was completed to confirm the injection site is a subacromial space of the  left  shoulder  Medication used Depo-Medrol 40 mg and lidocaine 1% 3 cc  Anesthesia was provided by ethyl chloride  The injection was performed in the left  posterior subacromial space. After pinning the skin with alcohol and anesthetized the skin with ethyl chloride the subacromial space was injected using a 20-gauge needle. There were no complications  Sterile dressing was applied.  Start PT for her neck   F/u 2 months   Heat and MR HS   No orders of the defined types were placed in this encounter.     Arther Abbott,  MD  11/13/2019 11:38 AM

## 2019-11-13 NOTE — Patient Instructions (Signed)
Muscle relaxers at night   Use heat during the day  Continue tylenol 500 mg every 6 hrs as needed   Start Therapy   You have received an injection of steroids into the joint. 15% of patients will have increased pain within the 24 hours postinjection.   This is transient and will go away.   We recommend that you use ice packs on the injection site for 20 minutes every 2 hours and extra strength Tylenol 2 tablets every 8 as needed until the pain resolves.  If you continue to have pain after taking the Tylenol and using the ice please call the office for further instructions.

## 2019-11-18 ENCOUNTER — Telehealth: Payer: Self-pay | Admitting: Orthopedic Surgery

## 2019-11-18 ENCOUNTER — Other Ambulatory Visit: Payer: Self-pay | Admitting: Orthopedic Surgery

## 2019-11-18 DIAGNOSIS — M542 Cervicalgia: Secondary | ICD-10-CM

## 2019-11-18 MED ORDER — TIZANIDINE HCL 4 MG PO TABS: 4.0000 mg | ORAL_TABLET | Freq: Four times a day (QID) | ORAL | 0 refills | Status: DC | PRN

## 2019-11-18 NOTE — Progress Notes (Signed)
Meds ordered this encounter  Medications   tiZANidine (ZANAFLEX) 4 MG tablet    Sig: Take 1 tablet (4 mg total) by mouth every 6 (six) hours as needed for muscle spasms.    Dispense:  30 tablet    Refill:  0    

## 2019-11-18 NOTE — Telephone Encounter (Addendum)
Patient called the office and left voicemail.  She states she was here on 11/13/19.  At that time she was under the impression that Dr Aline Brochure was going to send in a prescription for muscle relaxer to [facility] for her but this has not been done.  Can he write her a prescription for muscle relaxers?  She uses [facility]

## 2019-11-19 MED ORDER — TIZANIDINE HCL 4 MG PO TABS: 4.0000 mg | ORAL_TABLET | Freq: Four times a day (QID) | ORAL | 0 refills | Status: DC | PRN

## 2019-11-19 NOTE — Addendum Note (Signed)
Addended byCandice Camp on: 11/19/2019 10:39 AM   Modules accepted: Orders

## 2019-11-27 ENCOUNTER — Other Ambulatory Visit: Payer: Self-pay

## 2019-11-27 ENCOUNTER — Encounter: Payer: Self-pay | Admitting: Family Medicine

## 2019-11-27 ENCOUNTER — Ambulatory Visit: Payer: BC Managed Care – PPO | Admitting: Family Medicine

## 2019-11-27 VITALS — BP 140/78 | HR 66 | Temp 97.9°F | Resp 14 | Ht 62.0 in | Wt 275.0 lb

## 2019-11-27 DIAGNOSIS — J329 Chronic sinusitis, unspecified: Secondary | ICD-10-CM

## 2019-11-27 DIAGNOSIS — J310 Chronic rhinitis: Secondary | ICD-10-CM | POA: Diagnosis not present

## 2019-11-27 MED ORDER — AMOXICILLIN 875 MG PO TABS
875.0000 mg | ORAL_TABLET | Freq: Two times a day (BID) | ORAL | 0 refills | Status: DC
Start: 2019-11-27 — End: 2019-12-18

## 2019-11-27 MED ORDER — PREDNISONE 10 MG PO TABS
ORAL_TABLET | ORAL | 0 refills | Status: DC
Start: 2019-11-27 — End: 2019-12-18

## 2019-11-27 MED ORDER — IPRATROPIUM BROMIDE 0.03 % NA SOLN: 2.0000 | Freq: Two times a day (BID) | NASAL | 3 refills | Status: DC

## 2019-11-27 NOTE — Patient Instructions (Addendum)
Start prednisone taper tomorrow  New nasal spray atrovent Amoxicillin prescribed Call the ENT  F/U as previous

## 2019-11-27 NOTE — Progress Notes (Signed)
   Subjective:    Patient ID: Monique Gomez Dec, female    DOB: 1955/06/06, 64 y.o.   MRN: [id number]  Patient presents for Sinus Issues (HA, sinus pressure, nasal congestion )  Has sinus pressure constantly, at times feels dizzy  her nose runs all the time, just drips She feels like she cant breath out her nose She feels like she cant catch breath when she is clogged up, States lymph nodes will flare up and go down Feels like face is swollen as well No dental pain  She takes flonase, mucinex, zyrtec, takes tylenol for pain/dizziness   She is getting PT for her DDD in neck/shoulder impingment  She had CT of sinus done in June 2020 that was normal   She did have improvement when on steroids   Review Of Systems:  GEN- denies fatigue, fever, weight loss,weakness, recent illness HEENT- denies eye drainage, change in vision, +nasal discharge, CVS- denies chest pain, palpitations RESP- denies SOB, cough, wheeze ABD- denies N/V, change in stools, abd pain GU- denies dysuria, hematuria, dribbling, incontinence MSK- denies joint pain, muscle aches, injury Neuro- denies headache, dizziness, syncope, seizure activity       Objective:    BP (!) 140/78   Pulse 66   Temp 97.9 F (36.6 C) (Temporal)   Resp 14   Ht 5\' 2"  (1.575 m)   Wt (!) 275 lb (124.7 kg)   SpO2 100%   BMI 50.30 kg/m  GEN- NAD, alert and oriented x3 HEENT- PERRL, EOMI, non injected sclera, pink conjunctiva, MMM, oropharynx clear, TM clear bilat no effusion, enlarged nasal turbinates + maxillary/ethmoid  sinus tenderness,  turbinates,  Nasal drainage  Neck- Supple, no LAD CVS- RRR, no murmur RESP-CTAB EXT- No edema Pulses- Radial 2+         Assessment & Plan:      Problem List Items Addressed This Visit    None    Visit Diagnoses    Chronic sinusitis, unspecified location    -  Primary   chronic sinusitis/rhinitis but normal CT within in past year, treat with longer steroid taper, change to  atrovent nasal , amoxicillin, continue oral allergy med She is going to schedule with ENT   Relevant Medications   predniSONE (DELTASONE) 10 MG tablet   ipratropium (ATROVENT) 0.03 % nasal spray   amoxicillin (AMOXIL) 875 MG tablet   Chronic rhinitis          Note: This dictation was prepared with Dragon dictation along with smaller phrase technology. Any transcriptional errors that result from this process are unintentional.

## 2019-12-03 ENCOUNTER — Other Ambulatory Visit: Payer: Self-pay

## 2019-12-03 ENCOUNTER — Encounter (HOSPITAL_COMMUNITY): Payer: Self-pay | Admitting: Physical Therapy

## 2019-12-03 ENCOUNTER — Ambulatory Visit (HOSPITAL_COMMUNITY): Payer: BC Managed Care – PPO | Attending: Orthopedic Surgery | Admitting: Physical Therapy

## 2019-12-03 DIAGNOSIS — M542 Cervicalgia: Secondary | ICD-10-CM | POA: Diagnosis not present

## 2019-12-03 NOTE — Patient Instructions (Signed)
Axial Extension (Chin Tuck)    Pull chin in and lengthen back of neck. Hold __3-5_ seconds while counting out loud. Repeat __10__ times. Do _1-2___ sessions per day.  http://[email or web address].exer.us/450   Copyright  VHI. All rights reserved.

## 2019-12-04 NOTE — Therapy (Signed)
[place], [place], [address] Phone: [phone]   Fax:  [phone]  Physical Therapy Evaluation  Patient Details  Name: Monique Gomez MRN: [id number] Date of Birth: [birth year] Referring Provider (PT): Arther Abbott, MD   Encounter Date: 12/03/2019   PT End of Session - 12/04/19 1046    Visit Number 1    Number of Visits 12    Date for PT Re-Evaluation 01/14/20    Authorization Type [employer] (No auth required; 30 visit limit)    Authorization - Visit Number 1    Authorization - Number of Visits 30    Progress Note Due on Visit 10    PT Start Time 5465    PT Stop Time 1730    PT Time Calculation (min) 40 min    Activity Tolerance Patient tolerated treatment well    Behavior During Therapy [facility] for tasks assessed/performed           Past Medical History:  Diagnosis Date  . A-fib ([place])   . Abdominal fibromatosis   . Allergic rhinitis   . Anemia   . Asthma   . Diabetes ([place])   . Edema of both lower extremities   . Essential hypertension   . GERD (gastroesophageal reflux disease)   . Headache(784.0)   . History of cardiac catheterization    No significant CAD May 2015  . History of migraine headaches   . Hyperlipidemia   . Migraines   . Obesity   . PAF (paroxysmal atrial fibrillation) ([place]) 08/2013  . Sleep apnea   . Type 2 diabetes mellitus ([place])     Past Surgical History:  Procedure Laterality Date  . CATARACT EXTRACTION    . CATARACT EXTRACTION W/PHACO Right 04/05/2019   Procedure: CATARACT EXTRACTION PHACO AND INTRAOCULAR LENS PLACEMENT RIGHT EYE (CDE: 3.19);  Surgeon: Baruch Goldmann, MD;  Location: [facility];  Service: Ophthalmology;  Laterality: Right;  . COLONOSCOPY N/A 02/02/2015   SLF: 1. one colon polyp removed-no source for anemia identified. 2. moderate diverticulosis noted in the sigmoid colon and descending colon 3. the left colon is redundant 4. Rectal bleeding due ot small internal  hemorroids 5. Moderate sized external hemorrhoids.  . ESOPHAGOGASTRODUODENOSCOPY N/A 02/02/2015   SLF: 1. 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 large hiatal hernia 3. mild non-erosive gastritis 4. No source for anemia identified.   Marland Kitchen LEFT HEART CATHETERIZATION WITH CORONARY ANGIOGRAM N/A 09/03/2013   Procedure: LEFT HEART CATHETERIZATION WITH CORONARY ANGIOGRAM;  Surgeon: Jettie Booze, MD;  Location: [facility] CATH LAB;  Service: Cardiovascular;  Laterality: N/A;  . Right carpal tunnel release      There were no vitals filed for this visit.    Subjective Assessment - 12/03/19 1654    Subjective Patient reported that she has had neck pain for a while and that she has severe sinusitis. She stated that she has been having some spasms of her neck. She said that the left side hurts more than the right, but both sides hurt. She said the pain goes into her shoulders and at times it goes down in her arm. She reported that she has tendinitis and carpal tunnel. She stated that she had an x-ray and that it showed that she has issues with the discs in her neck, but that it is not serious enough for her to need surgery.    Pertinent History Neck pain which has been ongoing for many months    Limitations  Lifting    Diagnostic tests X-ray cervical    Patient Stated Goals Make the pain go away and avoid surgery    Currently in Pain? Yes    Pain Score 4     Pain Location Neck    Pain Orientation Right;Left    Pain Descriptors / Indicators Nagging    Pain Type Chronic pain    Pain Onset More than a month ago    Pain Frequency Constant    Aggravating Factors  Sleeping wrong, tension    Pain Relieving Factors Heat                  12/03/19 0001  Assessment  Medical Diagnosis Neck pain; shoulder impingement  Referring Provider (PT) Arther Abbott, MD  Onset Date/Surgical Date  (Several months)  Hand Dominance Right  Next MD Visit September 2021  Prior Therapy None  Precautions   Precautions None  Restrictions  Weight Bearing Restrictions No  Balance Screen  Has the patient fallen in the past 6 months Yes  How many times? 1  Has the patient had a decrease in activity level because of a fear of falling?  No  Is the patient reluctant to leave their home because of a fear of falling?  No  Home Environment  Living Environment Private residence  Type of [place]  Prior Function  Level of Independence Independent  Vocation Full time employment  Vocation Requirements Social worker  Cognition  Overall Cognitive Status Within Functional Limits for tasks assessed  Observation/Other Assessments  Focus on Therapeutic Outcomes (FOTO)  57.4%  ROM / Strength  AROM / PROM / Strength AROM;Strength  AROM  AROM Assessment Site Cervical  Cervical Flexion 35  Cervical Extension 35 (slight pain)  Cervical - Right Side Bend 20 (Pain in neck and in LT shoulder)  Cervical - Left Side Bend 33 (painful in anterior muscles or the right side of neck)  Cervical - Right Rotation 45  Cervical - Left Rotation 60  Strength  Overall Strength Comments Bilateral shoulder and elbow strength WFL  Palpation  Palpation comment TTTP of RT supraspinatus. TTP of LT anterior cervical musculature.              Objective measurements completed on examination: See above findings.               PT Education - 12/03/19 1045    Education Details Patient educated on examination findings, POC, and initial HEP.    Person(s) Educated Patient    Methods Explanation;Handout    Comprehension Verbalized understanding            PT Short Term Goals - 12/03/19 1048      PT SHORT TERM GOAL #1   Title Patient will report understanding and regular compliance with HEP to improve strength, mobility and decrease pain.    Time 3    Period Weeks    Status New    Target Date 12/25/19             PT Long Term Goals - 12/04/19 1048      PT LONG TERM GOAL #1   Title Patient  will demonstrate cervical AROM WFL for improved functional mobility.    Time 6    Period Weeks    Status New    Target Date 01/15/20      PT LONG TERM GOAL #2   Title Patient will report an improvement of at least 50% in overall subjective complaint for  improved QOL.    Time 6    Period Weeks    Status New    Target Date 01/15/20      PT LONG TERM GOAL #3   Title Patient will demonstrate improvement on FOTO of at least 10% indicating improvement in overall functional mobility.    Time 6    Period Weeks    Status New    Target Date 01/15/20                  Plan - 12/03/19 1055    Clinical Impression Statement Patient is a 64 year old female who presents to outpatient physical therapy with primary complaint of neck pain with radiation into shoulders which has been ongoing for several months. Upon examination, noted decreased cervical mobility with increased pain with AROM. In addition, noted tenderness through periscapular muscles as well as anterior cervical muscles. Patient would benefit from continued skilled physical therapy in order to address the abovementioned deficits and help patient return to PLOF.    Personal Factors and Comorbidities Age;Time since onset of injury/illness/exacerbation;Comorbidity 2    Comorbidities HTN, DM    Examination-Activity Limitations Lift;Sit;Dressing;Hygiene/Grooming    Examination-Participation Restrictions Cleaning;Meal Prep;Community Activity    Stability/Clinical Decision Making Stable/Uncomplicated    Clinical Decision Making Low    Rehab Potential Good    PT Frequency 2x / week    PT Duration 6 weeks    PT Treatment/Interventions ADLs/Self Care Home Management;Aquatic Therapy;Cryotherapy;Electrical Stimulation;Moist Heat;Functional mobility training;Therapeutic activities;Therapeutic exercise;Neuromuscular re-education;Patient/family education;Manual techniques;Passive range of motion;Dry needling;Energy conservation;Taping    PT  Next Visit Plan Review evaluation and HEP. focus on gentle RTC strengthening, cervical mobility, and STM for pain reduction PRN to periscpular/ anterior cervical musculature    PT Home Exercise Plan 12/04/19: chin tuck    Consulted and Agree with Plan of Care Patient           Patient will benefit from skilled therapeutic intervention in order to improve the following deficits and impairments:  Improper body mechanics, Pain, Decreased mobility, Increased muscle spasms, Postural dysfunction, Decreased activity tolerance, Decreased endurance, Decreased range of motion, Decreased strength, Hypomobility  Visit Diagnosis: Cervicalgia     Problem List Patient Active Problem List   Diagnosis Date Noted  . Vitamin D deficiency 07/18/2019  . Empty sella syndrome ([place]) 10/24/2018  . Screening for colon cancer 01/07/2015  . Hemorrhoids 11/25/2014  . Obstructive sleep apnea 10/14/2013  . PAF (paroxysmal atrial fibrillation) ([place]) 09/06/2013  . Lumbar back pain 09/06/2013  . NSTEMI (non-ST elevated myocardial infarction) ([place]) 09/03/2013  . MDD (major depressive disorder), single episode 02/04/2013  . Anxiety state, unspecified 02/04/2013  . Diabetes mellitus ([place]) 06/06/2012  . Carpal tunnel syndrome of left wrist 12/27/2011  . De Quervain's disease (tenosynovitis) 10/09/2011  . Heartburn 02/27/2011  . Allergic rhinitis 08/29/2010  . Anemia 02/15/2010  . Hyperlipidemia 12/23/2009  . Class 3 obesity 12/23/2009  . Chronic migraine 12/23/2009   Monique Gomez PT, DPT 10:59 AM, 12/04/19 [place], [place], [address] Phone: [phone]   Fax:  [phone]  Name: Monique Gomez MRN: [id number] Date of Birth: 1955-10-07

## 2019-12-10 ENCOUNTER — Ambulatory Visit (HOSPITAL_COMMUNITY): Payer: BC Managed Care – PPO | Admitting: Physical Therapy

## 2019-12-12 ENCOUNTER — Encounter (HOSPITAL_COMMUNITY): Payer: BC Managed Care – PPO | Admitting: Physical Therapy

## 2019-12-16 ENCOUNTER — Other Ambulatory Visit: Payer: Self-pay | Admitting: Family Medicine

## 2019-12-16 DIAGNOSIS — K219 Gastro-esophageal reflux disease without esophagitis: Secondary | ICD-10-CM

## 2019-12-18 ENCOUNTER — Ambulatory Visit: Payer: BC Managed Care – PPO | Admitting: Family Medicine

## 2019-12-18 ENCOUNTER — Other Ambulatory Visit: Payer: Self-pay

## 2019-12-18 ENCOUNTER — Encounter: Payer: Self-pay | Admitting: Family Medicine

## 2019-12-18 VITALS — BP 138/76 | HR 70 | Temp 98.0°F | Resp 14 | Ht 62.0 in | Wt 275.0 lb

## 2019-12-18 DIAGNOSIS — J320 Chronic maxillary sinusitis: Secondary | ICD-10-CM

## 2019-12-18 DIAGNOSIS — I480 Paroxysmal atrial fibrillation: Secondary | ICD-10-CM

## 2019-12-18 DIAGNOSIS — E1169 Type 2 diabetes mellitus with other specified complication: Secondary | ICD-10-CM

## 2019-12-18 DIAGNOSIS — E785 Hyperlipidemia, unspecified: Secondary | ICD-10-CM | POA: Diagnosis not present

## 2019-12-18 DIAGNOSIS — E559 Vitamin D deficiency, unspecified: Secondary | ICD-10-CM | POA: Diagnosis not present

## 2019-12-18 DIAGNOSIS — E669 Obesity, unspecified: Secondary | ICD-10-CM | POA: Diagnosis not present

## 2019-12-18 DIAGNOSIS — E119 Type 2 diabetes mellitus without complications: Secondary | ICD-10-CM | POA: Diagnosis not present

## 2019-12-18 MED ORDER — SEMAGLUTIDE 7 MG PO TABS: 7.0000 mg | ORAL_TABLET | Freq: Every day | ORAL | 2 refills | Status: DC

## 2019-12-18 NOTE — Progress Notes (Signed)
   Subjective:    Patient ID: Monique Gomez, female    DOB: 12-24-1955, 64 y.o.   MRN: [id number]  Patient presents for Follow-up (is fasting) and Referral (ENT- continued sinus issues) Patient here follow-up on medical problems.  She has been seen by few times secondary to sinusitis and lymphadenopathy since her last visit. She tried to schedule her own appointment with ENT but needs a referral.  She does get some relief that she uses her nasal steroids along with Tylenol but has constant symptoms.  Sometimes there is a severe makes her dizzy.  Hyperlipidemia she is currently on Lipitor 80 mg lipids in April LDL 96   Diabetes mellitus she is currently on Metformin as well Rybelsus (some days this was prescribed by weight loss clinic.)   Her last A1c was 6.4% in March as unable to afford to go back to the weight loss clinic.     Paroxysmal atrial fibrillation she is still followed by cardiology she is on Eliquis she has not had any abnormal bleeding.  HTN- she is taking lisinopril 40mg   Review Of Systems:  GEN- denies fatigue, fever, weight loss,weakness, recent illness HEENT- denies eye drainage, change in vision, nasal discharge, CVS- denies chest pain, palpitations RESP- denies SOB, cough, wheeze ABD- denies N/V, change in stools, abd pain GU- denies dysuria, hematuria, dribbling, incontinence MSK- denies joint pain, muscle aches, injury Neuro- denies headache, dizziness, syncope, seizure activity       Objective:    BP 138/76   Pulse 70   Temp 98 F (36.7 C) (Temporal)   Resp 14   Ht 5\' 2"  (1.575 m)   Wt 275 lb (124.7 kg)   SpO2 98%   BMI 50.30 kg/m  GEN- NAD, alert and oriented x3 HEENT- PERRL, EOMI, non injected sclera, pink conjunctiva, MMM, oropharynx clear Neck- Supple, no thyromegaly CVS- RRR, no murmur RESP-CTAB ABD-NABS,soft,NT,ND EXT- No edema Pulses- Radial, DP- 2+        Assessment & Plan:      Problem List Items Addressed This Visit       Unprioritized   Class 3 obesity    Weight is stable, She has not gained since last visit I think taking oral GLP-1 with metformin will help with additional weight loss       Hyperlipidemia   Relevant Orders   Lipid panel   Hyperlipidemia associated with type 2 diabetes mellitus ([place])    Continue metformin Recommend  She take oral GLP1 therapy regularly  On statin drug       Relevant Medications   Semaglutide 7 MG TABS   Other Relevant Orders   CBC with Differential/Platelet   Comprehensive metabolic panel   Hemoglobin A1c   HM DIABETES FOOT EXAM (Completed)   Microalbumin / creatinine urine ratio   PAF (paroxysmal atrial fibrillation) (HCC) - Primary    NSR, continue atenolol and eliquis BP controlled Check renal function and CBC today       Vitamin D deficiency   Relevant Orders   Vitamin D, 25-hydroxy    Other Visit Diagnoses    Chronic maxillary sinusitis       Relevant Orders   Ambulatory referral to ENT      Note: This dictation was prepared with Dragon dictation along with smaller phrase technology. Any transcriptional errors that result from this process are unintentional.

## 2019-12-18 NOTE — Patient Instructions (Signed)
F/U 4 months  We will call with lab results Referral to ENT

## 2019-12-18 NOTE — Assessment & Plan Note (Signed)
Continue metformin Recommend  She take oral GLP1 therapy regularly  On statin drug

## 2019-12-18 NOTE — Assessment & Plan Note (Signed)
Weight is stable, She has not gained since last visit I think taking oral GLP-1 with metformin will help with additional weight loss

## 2019-12-18 NOTE — Assessment & Plan Note (Signed)
NSR, continue atenolol and eliquis BP controlled Check renal function and CBC today

## 2019-12-19 ENCOUNTER — Encounter (HOSPITAL_COMMUNITY): Payer: BC Managed Care – PPO | Admitting: Physical Therapy

## 2019-12-19 ENCOUNTER — Ambulatory Visit (HOSPITAL_COMMUNITY): Payer: BC Managed Care – PPO

## 2019-12-19 LAB — CBC WITH DIFFERENTIAL/PLATELET
Absolute Monocytes: 736 cells/uL (ref 200–950)
Basophils Absolute: 37 cells/uL (ref 0–200)
Basophils Relative: 0.4 %
Eosinophils Absolute: 184 cells/uL (ref 15–500)
Eosinophils Relative: 2 %
HCT: 36.9 % (ref 35.0–45.0)
Hemoglobin: 10.9 g/dL — ABNORMAL LOW (ref 11.7–15.5)
Lymphs Abs: 2015 cells/uL (ref 850–3900)
MCH: 21.3 pg — ABNORMAL LOW (ref 27.0–33.0)
MCHC: 29.5 g/dL — ABNORMAL LOW (ref 32.0–36.0)
MCV: 72.2 fL — ABNORMAL LOW (ref 80.0–100.0)
MPV: 10.8 fL (ref 7.5–12.5)
Monocytes Relative: 8 %
Neutro Abs: 6228 cells/uL (ref 1500–7800)
Neutrophils Relative %: 67.7 %
Platelets: 310 10*3/uL (ref 140–400)
RBC: 5.11 10*6/uL — ABNORMAL HIGH (ref 3.80–5.10)
RDW: 16.2 % — ABNORMAL HIGH (ref 11.0–15.0)
Total Lymphocyte: 21.9 %
WBC: 9.2 10*3/uL (ref 3.8–10.8)

## 2019-12-19 LAB — HEMOGLOBIN A1C
Hgb A1c MFr Bld: 6.4 % of total Hgb — ABNORMAL HIGH (ref <5.7)
Mean Plasma Glucose: 137 (calc)
eAG (mmol/L): 7.6 (calc)

## 2019-12-19 LAB — MICROALBUMIN / CREATININE URINE RATIO
Creatinine, Urine: 246 mg/dL (ref 20–275)
Microalb Creat Ratio: 4 mcg/mg creat (ref <30)
Microalb, Ur: 1 mg/dL

## 2019-12-19 LAB — COMPREHENSIVE METABOLIC PANEL
AG Ratio: 1.2 (calc) (ref 1.0–2.5)
ALT: 10 U/L (ref 6–29)
AST: 12 U/L (ref 10–35)
Albumin: 3.6 g/dL (ref 3.6–5.1)
Alkaline phosphatase (APISO): 85 U/L (ref 37–153)
BUN: 21 mg/dL (ref 7–25)
CO2: 25 mmol/L (ref 20–32)
Calcium: 9.6 mg/dL (ref 8.6–10.4)
Chloride: 109 mmol/L (ref 98–110)
Creat: 0.84 mg/dL (ref 0.50–0.99)
Globulin: 3 g/dL (calc) (ref 1.9–3.7)
Glucose, Bld: 110 mg/dL — ABNORMAL HIGH (ref 65–99)
Potassium: 4.4 mmol/L (ref 3.5–5.3)
Sodium: 142 mmol/L (ref 135–146)
Total Bilirubin: 0.2 mg/dL (ref 0.2–1.2)
Total Protein: 6.6 g/dL (ref 6.1–8.1)

## 2019-12-19 LAB — LIPID PANEL
Cholesterol: 165 mg/dL (ref <200)
HDL: 42 mg/dL — ABNORMAL LOW (ref >=50)
LDL Cholesterol (Calc): 107 mg/dL (calc) — ABNORMAL HIGH
Non-HDL Cholesterol (Calc): 123 mg/dL (calc) (ref <130)
Total CHOL/HDL Ratio: 3.9 (calc) (ref <5.0)
Triglycerides: 74 mg/dL (ref <150)

## 2019-12-19 LAB — VITAMIN D 25 HYDROXY (VIT D DEFICIENCY, FRACTURES): Vit D, 25-Hydroxy: 28 ng/mL — ABNORMAL LOW (ref 30–100)

## 2019-12-25 ENCOUNTER — Encounter (HOSPITAL_COMMUNITY): Payer: BC Managed Care – PPO | Admitting: Physical Therapy

## 2019-12-25 ENCOUNTER — Telehealth (HOSPITAL_COMMUNITY): Payer: Self-pay | Admitting: Physical Therapy

## 2019-12-25 ENCOUNTER — Ambulatory Visit (HOSPITAL_COMMUNITY): Payer: BC Managed Care – PPO | Admitting: Physical Therapy

## 2019-12-25 NOTE — Telephone Encounter (Signed)
Pt l/m -She has to wk and can not be here

## 2019-12-27 ENCOUNTER — Encounter (HOSPITAL_COMMUNITY): Payer: BC Managed Care – PPO

## 2019-12-27 ENCOUNTER — Encounter (HOSPITAL_COMMUNITY): Payer: BC Managed Care – PPO | Admitting: Physical Therapy

## 2019-12-27 ENCOUNTER — Telehealth (HOSPITAL_COMMUNITY): Payer: Self-pay

## 2019-12-27 NOTE — Telephone Encounter (Signed)
S/w she has to work today and can not come in today

## 2019-12-31 ENCOUNTER — Encounter (HOSPITAL_COMMUNITY): Payer: BC Managed Care – PPO | Admitting: Physical Therapy

## 2020-01-01 ENCOUNTER — Telehealth (HOSPITAL_COMMUNITY): Payer: Self-pay

## 2020-01-01 ENCOUNTER — Encounter (HOSPITAL_COMMUNITY): Payer: BC Managed Care – PPO | Admitting: Physical Therapy

## 2020-01-01 ENCOUNTER — Other Ambulatory Visit (HOSPITAL_COMMUNITY): Payer: Self-pay | Admitting: Family Medicine

## 2020-01-01 ENCOUNTER — Encounter (HOSPITAL_COMMUNITY): Payer: BC Managed Care – PPO

## 2020-01-01 DIAGNOSIS — Z1231 Encounter for screening mammogram for malignant neoplasm of breast: Secondary | ICD-10-CM

## 2020-01-01 NOTE — Telephone Encounter (Signed)
No show, called and left message concerning missed apt today.  Included next apt date and time with contact information included.  Mentioned the no show policy details in message as well.    Ihor Austin, LPTA/CLT; Delana Meyer [phone]

## 2020-01-02 ENCOUNTER — Encounter (HOSPITAL_COMMUNITY): Payer: BC Managed Care – PPO | Admitting: Physical Therapy

## 2020-01-03 ENCOUNTER — Ambulatory Visit (HOSPITAL_COMMUNITY): Payer: BC Managed Care – PPO | Attending: Orthopedic Surgery

## 2020-01-03 ENCOUNTER — Encounter (HOSPITAL_COMMUNITY): Payer: Self-pay

## 2020-01-03 ENCOUNTER — Other Ambulatory Visit: Payer: Self-pay

## 2020-01-03 DIAGNOSIS — M542 Cervicalgia: Secondary | ICD-10-CM | POA: Insufficient documentation

## 2020-01-03 NOTE — Patient Instructions (Signed)
Scapular Retraction (Standing)    With arms at sides, pinch shoulder blades together. Repeat 10 times per set. Do 2 sets per session.  http://orth.exer.us/945   Copyright  VHI. All rights reserved.

## 2020-01-03 NOTE — Therapy (Signed)
[place], [place], [address] Phone: [phone]   Fax:  [phone]  Physical Therapy Treatment  Patient Details  Name: Monique Gomez MRN: [id number] Date of Birth: 1955-11-19 Referring Provider (PT): Arther Abbott, MD   Encounter Date: 01/03/2020   PT End of Session - 01/03/20 1759    Visit Number 2    Number of Visits 12    Date for PT Re-Evaluation 01/14/20    Authorization Type BCBS (No auth required; 30 visit limit)    Authorization - Visit Number 2    Authorization - Number of Visits 30    Progress Note Due on Visit 10    PT Start Time 6967    PT Stop Time 1828    PT Time Calculation (min) 41 min    Activity Tolerance Patient tolerated treatment well;Patient limited by pain;No increased pain    Behavior During Therapy WFL for tasks assessed/performed           Past Medical History:  Diagnosis Date  . A-fib ([place])   . Abdominal fibromatosis   . Allergic rhinitis   . Anemia   . Asthma   . Diabetes ([place])   . Edema of both lower extremities   . Essential hypertension   . GERD (gastroesophageal reflux disease)   . Headache(784.0)   . History of cardiac catheterization    No significant CAD May 2015  . History of migraine headaches   . Hyperlipidemia   . Migraines   . Obesity   . PAF (paroxysmal atrial fibrillation) ([place]) 08/2013  . Sleep apnea   . Type 2 diabetes mellitus ([place])     Past Surgical History:  Procedure Laterality Date  . CATARACT EXTRACTION    . CATARACT EXTRACTION W/PHACO Right 04/05/2019   Procedure: CATARACT EXTRACTION PHACO AND INTRAOCULAR LENS PLACEMENT RIGHT EYE (CDE: 3.19);  Surgeon: Baruch Goldmann, MD;  Location: [facility];  Service: Ophthalmology;  Laterality: Right;  . COLONOSCOPY N/A 02/02/2015   SLF: 1. one colon polyp removed-no source for anemia identified. 2. moderate diverticulosis noted in the sigmoid colon and descending colon 3. the left colon is redundant 4.  Rectal bleeding due ot small internal hemorroids 5. Moderate sized external hemorrhoids.  . ESOPHAGOGASTRODUODENOSCOPY N/A 02/02/2015   SLF: 1. 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 large hiatal hernia 3. mild non-erosive gastritis 4. No source for anemia identified.   Marland Kitchen LEFT HEART CATHETERIZATION WITH CORONARY ANGIOGRAM N/A 09/03/2013   Procedure: LEFT HEART CATHETERIZATION WITH CORONARY ANGIOGRAM;  Surgeon: Jettie Booze, MD;  Location: [facility] CATH LAB;  Service: Cardiovascular;  Laterality: N/A;  . Right carpal tunnel release      There were no vitals filed for this visit.   Subjective Assessment - 01/03/20 1750    Subjective Pt reports she has had a stressful week with work.  Current pain scale 8/10 posterior neck with constant nagging pain.  No radicular symptoms currently, does sometimes go all the way to fingers primarly Lt UE.  Reports compliance with chin tucks daily    Pertinent History Neck pain which has been ongoing for many months    Patient Stated Goals Make the pain go away and avoid surgery    Currently in Pain? Yes    Pain Score 8     Pain Location Neck    Pain Orientation Posterior    Pain Descriptors / Indicators Constant   Stress pain   Pain Type Chronic pain  Pain Radiating Towards Lt>Rt sometimes down to fingers    Pain Onset More than a month ago    Pain Frequency Constant    Aggravating Factors  Sleeping wrong, tension    Pain Relieving Factors Heat                             [facility] Adult PT Treatment/Exercise - 01/03/20 0001      Exercises   Exercises Neck      Neck Exercises: Seated   Neck Retraction 5 reps;5 secs    Neck Retraction Limitations min cueing for form; 2 sets    Other Seated Exercise 3D cervical excursion 10x pain free range    Other Seated Exercise scapular retraction      Neck Exercises: Supine   Other Supine Exercise Decompression exercises 2-5 5x 5" holds      Manual Therapy   Manual Therapy Soft  tissue mobilization    Manual therapy comments Manual complete separate than rest of tx    Soft tissue mobilization Supine wiht LE elevated: STM to cervical mm and PROM                   PT Education - 01/03/20 1756    Education Details Reviewed goals, educated importance of HEP compliance and assessed compliance wiht current exercise program.  Pt able to recall and demonstrate    Person(s) Educated Patient    Methods Explanation    Comprehension Verbalized understanding;Returned demonstration            PT Short Term Goals - 12/03/19 1048      PT SHORT TERM GOAL #1   Title Patient will report understanding and regular compliance with HEP to improve strength, mobility and decrease pain.    Time 3    Period Weeks    Status New    Target Date 12/25/19             PT Long Term Goals - 12/04/19 1048      PT LONG TERM GOAL #1   Title Patient will demonstrate cervical AROM WFL for improved functional mobility.    Time 6    Period Weeks    Status New    Target Date 01/15/20      PT LONG TERM GOAL #2   Title Patient will report an improvement of at least 50% in overall subjective complaint for improved QOL.    Time 6    Period Weeks    Status New    Target Date 01/15/20      PT LONG TERM GOAL #3   Title Patient will demonstrate improvement on FOTO of at least 10% indicating improvement in overall functional mobility.    Time 6    Period Weeks    Status New    Target Date 01/15/20                 Plan - 01/03/20 1811    Clinical Impression Statement Reviewed goals, educated importance of HEP compliance for maximal benefits.  Pt able to recall and demonstrate exercise program with minimal cueing to reduce forward flexed and improve retraction.  Session focus wiht cervical mobility and postural strengthening.  Pt able to complete all exercises with good form and no reports of pain.  EOS STM to cervical mm to reduce spasms UT and improve mobility.  Pt  reports stress reduced and pain reduced to 6/10.  Personal Factors and Comorbidities Age;Time since onset of injury/illness/exacerbation;Comorbidity 2    Comorbidities HTN, DM    Examination-Activity Limitations Lift;Sit;Dressing;Hygiene/Grooming    Examination-Participation Restrictions Cleaning;Meal Prep;Community Activity    Stability/Clinical Decision Making Stable/Uncomplicated    Clinical Decision Making Low    Rehab Potential Good    PT Frequency 2x / week    PT Duration 6 weeks    PT Treatment/Interventions ADLs/Self Care Home Management;Aquatic Therapy;Cryotherapy;Electrical Stimulation;Moist Heat;Functional mobility training;Therapeutic activities;Therapeutic exercise;Neuromuscular re-education;Patient/family education;Manual techniques;Passive range of motion;Dry needling;Energy conservation;Taping    PT Next Visit Plan Focus on gentle RTC strengthening, cervical mobility, and STM for pain reduction PRN to periscpular/ anterior cervical musculature    PT Home Exercise Plan 12/04/19: chin tuck; 01/03/20: decompression exercises and scapular retraction           Patient will benefit from skilled therapeutic intervention in order to improve the following deficits and impairments:  Improper body mechanics, Pain, Decreased mobility, Increased muscle spasms, Postural dysfunction, Decreased activity tolerance, Decreased endurance, Decreased range of motion, Decreased strength, Hypomobility  Visit Diagnosis: Cervicalgia     Problem List Patient Active Problem List   Diagnosis Date Noted  . Vitamin D deficiency 07/18/2019  . Empty sella syndrome ([place]) 10/24/2018  . Screening for colon cancer 01/07/2015  . Hemorrhoids 11/25/2014  . Obstructive sleep apnea 10/14/2013  . PAF (paroxysmal atrial fibrillation) ([place]) 09/06/2013  . Lumbar back pain 09/06/2013  . NSTEMI (non-ST elevated myocardial infarction) ([place]) 09/03/2013  . MDD (major depressive disorder), single episode 02/04/2013    . Anxiety state, unspecified 02/04/2013  . Hyperlipidemia associated with type 2 diabetes mellitus ([place]) 06/06/2012  . Carpal tunnel syndrome of left wrist 12/27/2011  . De Quervain's disease (tenosynovitis) 10/09/2011  . Heartburn 02/27/2011  . Allergic rhinitis 08/29/2010  . Anemia 02/15/2010  . Hyperlipidemia 12/23/2009  . Class 3 obesity 12/23/2009  . Chronic migraine 12/23/2009   Ihor Austin, LPTA/CLT; CBIS [phone]  Aldona Lento 01/03/2020, 6:39 PM  [facility] [address] [place], [place], [address] Phone: [phone]   Fax:  [phone]  Name: Monique Gomez MRN: [id number] Date of Birth: 05-28-1955

## 2020-01-06 ENCOUNTER — Other Ambulatory Visit: Payer: Self-pay | Admitting: Neurology

## 2020-01-07 ENCOUNTER — Other Ambulatory Visit: Payer: Self-pay

## 2020-01-07 ENCOUNTER — Encounter (HOSPITAL_COMMUNITY): Payer: BC Managed Care – PPO | Admitting: Physical Therapy

## 2020-01-07 ENCOUNTER — Telehealth (HOSPITAL_COMMUNITY): Payer: Self-pay

## 2020-01-07 ENCOUNTER — Encounter (HOSPITAL_COMMUNITY): Payer: Self-pay | Admitting: Physical Therapy

## 2020-01-07 ENCOUNTER — Ambulatory Visit (HOSPITAL_COMMUNITY): Payer: BC Managed Care – PPO | Admitting: Physical Therapy

## 2020-01-07 DIAGNOSIS — M542 Cervicalgia: Secondary | ICD-10-CM | POA: Diagnosis not present

## 2020-01-07 NOTE — Therapy (Signed)
[place], [place], [address] Phone: [phone]   Fax:  [phone]  Physical Therapy Treatment  Patient Details  Name: Monique Gomez MRN: [id number] Date of Birth: 08-12-1955 Referring Provider (PT): Arther Abbott, MD   Encounter Date: 01/07/2020   PT End of Session - 01/07/20 1657    Visit Number 3    Number of Visits 12    Date for PT Re-Evaluation 01/14/20    Authorization Type BCBS (No auth required; 30 visit limit)    Authorization - Visit Number 3    Authorization - Number of Visits 30    Progress Note Due on Visit 10    PT Start Time 4097    PT Stop Time 1730    PT Time Calculation (min) 38 min    Activity Tolerance Patient tolerated treatment well;No increased pain    Behavior During Therapy WFL for tasks assessed/performed           Past Medical History:  Diagnosis Date  . A-fib ([place])   . Abdominal fibromatosis   . Allergic rhinitis   . Anemia   . Asthma   . Diabetes ([place])   . Edema of both lower extremities   . Essential hypertension   . GERD (gastroesophageal reflux disease)   . Headache(784.0)   . History of cardiac catheterization    No significant CAD May 2015  . History of migraine headaches   . Hyperlipidemia   . Migraines   . Obesity   . PAF (paroxysmal atrial fibrillation) ([place]) 08/2013  . Sleep apnea   . Type 2 diabetes mellitus ([place])     Past Surgical History:  Procedure Laterality Date  . CATARACT EXTRACTION    . CATARACT EXTRACTION W/PHACO Right 04/05/2019   Procedure: CATARACT EXTRACTION PHACO AND INTRAOCULAR LENS PLACEMENT RIGHT EYE (CDE: 3.19);  Surgeon: Baruch Goldmann, MD;  Location: [facility];  Service: Ophthalmology;  Laterality: Right;  . COLONOSCOPY N/A 02/02/2015   SLF: 1. one colon polyp removed-no source for anemia identified. 2. moderate diverticulosis noted in the sigmoid colon and descending colon 3. the left colon is redundant 4. Rectal bleeding due ot  small internal hemorroids 5. Moderate sized external hemorrhoids.  . ESOPHAGOGASTRODUODENOSCOPY N/A 02/02/2015   SLF: 1. 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 large hiatal hernia 3. mild non-erosive gastritis 4. No source for anemia identified.   Marland Kitchen LEFT HEART CATHETERIZATION WITH CORONARY ANGIOGRAM N/A 09/03/2013   Procedure: LEFT HEART CATHETERIZATION WITH CORONARY ANGIOGRAM;  Surgeon: Jettie Booze, MD;  Location: [facility] CATH LAB;  Service: Cardiovascular;  Laterality: N/A;  . Right carpal tunnel release      There were no vitals filed for this visit.   Subjective Assessment - 01/07/20 1656    Subjective Patient says things are going ok. Says she has been doing her exercises. Not as sore as it has been. Pain is more on LT side now, instead of both.    Pertinent History Neck pain which has been ongoing for many months    Patient Stated Goals Make the pain go away and avoid surgery    Currently in Pain? Yes    Pain Score 5     Pain Location Neck    Pain Orientation Left    Pain Descriptors / Indicators Aching;Nagging    Pain Type Chronic pain    Pain Onset More than a month ago    Pain Frequency Constant  [facility] Adult PT Treatment/Exercise - 01/07/20 0001      Neck Exercises: Seated   Neck Retraction 20 reps    Other Seated Exercise 3D cervical excursion 10x ea pain free range    Other Seated Exercise scapular retraction 10 x 5"       Neck Exercises: Supine   Cervical Isometrics Right lateral flexion;Left lateral flexion;5 secs;5 reps      Manual Therapy   Manual Therapy Soft tissue mobilization    Manual therapy comments Manual complete separate than rest of tx    Soft tissue mobilization IASTM to LT upper trap, cervical and periscapular muscles with pateitn in seated       Neck Exercises: Stretches   Upper Trapezius Stretch Right;Left;3 reps;20 seconds                    PT Short Term Goals - 12/03/19  1048      PT SHORT TERM GOAL #1   Title Patient will report understanding and regular compliance with HEP to improve strength, mobility and decrease pain.    Time 3    Period Weeks    Status New    Target Date 12/25/19             PT Long Term Goals - 12/04/19 1048      PT LONG TERM GOAL #1   Title Patient will demonstrate cervical AROM WFL for improved functional mobility.    Time 6    Period Weeks    Status New    Target Date 01/15/20      PT LONG TERM GOAL #2   Title Patient will report an improvement of at least 50% in overall subjective complaint for improved QOL.    Time 6    Period Weeks    Status New    Target Date 01/15/20      PT LONG TERM GOAL #3   Title Patient will demonstrate improvement on FOTO of at least 10% indicating improvement in overall functional mobility.    Time 6    Period Weeks    Status New    Target Date 01/15/20                 Plan - 01/07/20 1825    Clinical Impression Statement Patient tolerated session well overall today. Patient does note slight pain with LT rotation and LT side bending, but eased during treatment time. Patient educated on proper form and function of added cervical isometrics for postural strengthening, and upper trap stretching for reducing muscle tension and improving pain free cervical mobility. Added IASTM to patient in seated position, targeting area identified per patient subjective report. Patient noted improved pain free mobility and pain reduction to 3/10 post treatment. Added upper trap stretching to HEP.    Personal Factors and Comorbidities Age;Time since onset of injury/illness/exacerbation;Comorbidity 2    Comorbidities HTN, DM    Examination-Activity Limitations Lift;Sit;Dressing;Hygiene/Grooming    Examination-Participation Restrictions Cleaning;Meal Prep;Community Activity    Stability/Clinical Decision Making Stable/Uncomplicated    Rehab Potential Good    PT Frequency 2x / week    PT Duration  6 weeks    PT Treatment/Interventions ADLs/Self Care Home Management;Aquatic Therapy;Cryotherapy;Electrical Stimulation;Moist Heat;Functional mobility training;Therapeutic activities;Therapeutic exercise;Neuromuscular re-education;Patient/family education;Manual techniques;Passive range of motion;Dry needling;Energy conservation;Taping    PT Next Visit Plan Focus on gentle RTC strengthening, cervical mobility, and STM for pain reduction PRN to periscpular/ anterior cervical musculature    PT Home Exercise Plan 12/04/19: chin tuck;  01/03/20: decompression exercises and scapular retraction 01/07/20: upper trap stretch    Consulted and Agree with Plan of Care Patient           Patient will benefit from skilled therapeutic intervention in order to improve the following deficits and impairments:  Improper body mechanics, Pain, Decreased mobility, Increased muscle spasms, Postural dysfunction, Decreased activity tolerance, Decreased endurance, Decreased range of motion, Decreased strength, Hypomobility  Visit Diagnosis: Cervicalgia     Problem List Patient Active Problem List   Diagnosis Date Noted  . Vitamin D deficiency 07/18/2019  . Empty sella syndrome ([place]) 10/24/2018  . Screening for colon cancer 01/07/2015  . Hemorrhoids 11/25/2014  . Obstructive sleep apnea 10/14/2013  . PAF (paroxysmal atrial fibrillation) ([place]) 09/06/2013  . Lumbar back pain 09/06/2013  . NSTEMI (non-ST elevated myocardial infarction) ([place]) 09/03/2013  . MDD (major depressive disorder), single episode 02/04/2013  . Anxiety state, unspecified 02/04/2013  . Hyperlipidemia associated with type 2 diabetes mellitus ([place]) 06/06/2012  . Carpal tunnel syndrome of left wrist 12/27/2011  . De Quervain's disease (tenosynovitis) 10/09/2011  . Heartburn 02/27/2011  . Allergic rhinitis 08/29/2010  . Anemia 02/15/2010  . Hyperlipidemia 12/23/2009  . Class 3 obesity 12/23/2009  . Chronic migraine 12/23/2009   6:29 PM,  01/07/20 Josue Hector PT DPT  Physical Therapist with [facility]  [phone] [place] [address] [place], [place], [address] Phone: [phone]   Fax:  [phone]  Name: Monique Gomez MRN: [id number] Date of Birth: Aug 02, 1955

## 2020-01-07 NOTE — Telephone Encounter (Signed)
pt cancelled appt for 9/8 because she has a meeting

## 2020-01-08 ENCOUNTER — Ambulatory Visit (HOSPITAL_COMMUNITY): Payer: BC Managed Care – PPO

## 2020-01-09 ENCOUNTER — Encounter (HOSPITAL_COMMUNITY): Payer: BC Managed Care – PPO | Admitting: Physical Therapy

## 2020-01-10 ENCOUNTER — Ambulatory Visit (INDEPENDENT_AMBULATORY_CARE_PROVIDER_SITE_OTHER): Payer: BC Managed Care – PPO | Admitting: Cardiology

## 2020-01-10 ENCOUNTER — Other Ambulatory Visit: Payer: Self-pay

## 2020-01-10 ENCOUNTER — Encounter: Payer: Self-pay | Admitting: Cardiology

## 2020-01-10 VITALS — BP 136/86 | HR 66 | Ht 62.0 in | Wt 278.0 lb

## 2020-01-10 DIAGNOSIS — Z9989 Dependence on other enabling machines and devices: Secondary | ICD-10-CM

## 2020-01-10 DIAGNOSIS — R0789 Other chest pain: Secondary | ICD-10-CM | POA: Diagnosis not present

## 2020-01-10 DIAGNOSIS — G4733 Obstructive sleep apnea (adult) (pediatric): Secondary | ICD-10-CM

## 2020-01-10 DIAGNOSIS — I480 Paroxysmal atrial fibrillation: Secondary | ICD-10-CM

## 2020-01-10 NOTE — Progress Notes (Signed)
Cardiology Office Note  Date: 01/10/2020   ID: Caree, Wolpert 01/19/1956, MRN [id number]  PCP:  Alycia Rossetti, MD  Cardiologist:  Rozann Lesches, MD Electrophysiologist:  None   Chief Complaint  Patient presents with  . Cardiac follow-up    History of Present Illness: Monique Gomez is a 64 y.o. female last seen in June by Mr. Leonides Sake NP.  She presents for a routine visit.  Describes no recurring chest pain at this time, no significant palpitations.  She continues to work as a Psychologist, sport and exercise, has been under a lot of stress.  Also undergoing PT for treatment of cervical disc pain.  We went over her medications which are stable from a cardiac perspective and outlined below.  I reviewed her lab work from August.  She does not describe any spontaneous bleeding problems on Eliquis.  I reviewed her cardiac testing from November 2020 as noted below.   Past Medical History:  Diagnosis Date  . Abdominal fibromatosis   . Allergic rhinitis   . Anemia   . Asthma   . Essential hypertension   . GERD (gastroesophageal reflux disease)   . Headache(784.0)   . History of cardiac catheterization    No significant CAD May 2015  . History of migraine headaches   . Hyperlipidemia   . Migraines   . Obesity   . PAF (paroxysmal atrial fibrillation) ([place]) 08/2013  . Sleep apnea   . Type 2 diabetes mellitus ([place])     Past Surgical History:  Procedure Laterality Date  . CATARACT EXTRACTION    . CATARACT EXTRACTION W/PHACO Right 04/05/2019   Procedure: CATARACT EXTRACTION PHACO AND INTRAOCULAR LENS PLACEMENT RIGHT EYE (CDE: 3.19);  Surgeon: Baruch Goldmann, MD;  Location: [facility];  Service: Ophthalmology;  Laterality: Right;  . COLONOSCOPY N/A 02/02/2015   SLF: 1. one colon polyp removed-no source for anemia identified. 2. moderate diverticulosis noted in the sigmoid colon and descending colon 3. the left colon is redundant 4. Rectal bleeding due ot small internal hemorroids  5. Moderate sized external hemorrhoids.  . ESOPHAGOGASTRODUODENOSCOPY N/A 02/02/2015   SLF: 1. 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 large hiatal hernia 3. mild non-erosive gastritis 4. No source for anemia identified.   Marland Kitchen LEFT HEART CATHETERIZATION WITH CORONARY ANGIOGRAM N/A 09/03/2013   Procedure: LEFT HEART CATHETERIZATION WITH CORONARY ANGIOGRAM;  Surgeon: Jettie Booze, MD;  Location: [facility] LAB;  Service: Cardiovascular;  Laterality: N/A;  . Right carpal tunnel release      Current Outpatient Medications  Medication Sig Dispense Refill  . acetaminophen (TYLENOL) 325 MG tablet Take 650 mg by mouth every 6 (six) hours as needed for moderate pain.     Marland Kitchen AIMOVIG 70 MG/ML SOAJ Inject 70 mg into the skin every 30 (thirty) days. 1 pen 11  . apixaban (ELIQUIS) 5 MG TABS tablet Take 1 tablet (5 mg total) by mouth 2 (two) times daily. 180 tablet 3  . atenolol (TENORMIN) 50 MG tablet Take 1 tablet by mouth once daily 90 tablet 0  . atorvastatin (LIPITOR) 80 MG tablet Take 1 tablet (80 mg total) by mouth daily. 90 tablet 3  . fluticasone (FLONASE) 50 MCG/ACT nasal spray Place 2 sprays into both nostrils daily.    Marland Kitchen ipratropium (ATROVENT) 0.03 % nasal spray Place 2 sprays into both nostrils every 12 (twelve) hours. 30 mL 3  . Iron-FA-B Cmp-C-Biot-Probiotic (FUSION PLUS) CAPS Take 2 tablets by mouth daily. 60 capsule 3  .  lisinopril (ZESTRIL) 40 MG tablet Take 1 tablet (40 mg total) by mouth daily. 90 tablet 3  . metFORMIN (GLUCOPHAGE) 500 MG tablet Take 1 tablet (500 mg total) by mouth 2 (two) times daily with a meal. 180 tablet 3  . metoCLOPramide (REGLAN) 10 MG tablet Take 1 tablet (10 mg total) by mouth every 8 (eight) hours as needed for up to 7 days for nausea (Headache). 21 tablet 0  . ondansetron (ZOFRAN ODT) 4 MG disintegrating tablet Take 1 tablet (4 mg total) by mouth every 8 (eight) hours as needed. 20 tablet 6  . pantoprazole (PROTONIX) 40 MG tablet Take 1 tablet by  mouth twice daily 180 tablet 0  . Semaglutide 7 MG TABS Take 7 mg by mouth daily before breakfast. 30 tablet 2  . SUMAtriptan (IMITREX) 6 MG/0.5ML SOLN injection Inject 0.5 mLs (6 mg total) into the skin every 2 (two) hours as needed for migraine or headache. May repeat in 2 hours if headache persists or recurs. 6 mL 6  . tiZANidine (ZANAFLEX) 4 MG tablet Take 1 tablet (4 mg total) by mouth every 6 (six) hours as needed for muscle spasms. 30 tablet 0  . topiramate (TOPAMAX) 50 MG tablet Take 1 tablet (50 mg total) by mouth at bedtime. Please call [phone] to schedule appt for continued refills. 90 tablet 0   No current facility-administered medications for this visit.   Allergies:  Venlafaxine   ROS: Chronic neck pain.  Also sinus trouble, plans to see ENT specialist soon.  Physical Exam: VS:  BP 136/86   Pulse 66   Ht 5\' 2"  (1.575 m)   Wt 278 lb (126.1 kg)   SpO2 99%   BMI 50.85 kg/m , BMI Body mass index is 50.85 kg/m.  Wt Readings from Last 3 Encounters:  01/10/20 278 lb (126.1 kg)  12/18/19 275 lb (124.7 kg)  11/27/19 (!) 275 lb (124.7 kg)    General: Patient appears comfortable at rest. HEENT: Conjunctiva and lids normal, wearing a mask. Neck: Supple, no elevated JVP or carotid bruits, no thyromegaly. Lungs: Clear to auscultation, nonlabored breathing at rest. Cardiac: Regular rate and rhythm, no S3, 2/6 systolic murmur, no pericardial rub. Extremities: No pitting edema, distal pulses 2+.  ECG:  An ECG dated 02/13/2019 was personally reviewed today and demonstrated:  Sinus rhythm with diffuse inferolateral T wave abnormalities that are old.  Recent Labwork: 07/17/2019: TSH 2.210 10/15/2019: Magnesium 1.8 12/18/2019: ALT 10; AST 12; BUN 21; Creat 0.84; Hemoglobin 10.9; Platelets 310; Potassium 4.4; Sodium 142     Component Value Date/Time   CHOL 165 12/18/2019 0822   TRIG 74 12/18/2019 0822   HDL 42 (L) 12/18/2019 0822   CHOLHDL 3.9 12/18/2019 0822   VLDL 13 08/20/2015  0935   LDLCALC 107 (H) 12/18/2019 0822    Other Studies Reviewed Today:  Echocardiogram 03/14/2019: 1. Left ventricular ejection fraction, by visual estimation, is 70 to  75%. The left ventricle has hyperdynamic function. There is moderately  increased left ventricular hypertrophy with asymmetric septal hypertrophy  and mild LVOT gradient with Valsalva.  2. Left ventricular diastolic parameters are consistent with Grade I  diastolic dysfunction (impaired relaxation).  3. Global right ventricle has normal systolic function.The right  ventricular size is normal. Mildly increased right ventricular wall  thickness.  4. Left atrial size was normal.  5. Right atrial size was normal.  6. Mild to moderate aortic valve annular calcification.  7. The mitral valve is grossly normal. Mild mitral  valve regurgitation.  8. The tricuspid valve is grossly normal. Tricuspid valve regurgitation  is mild.  9. The aortic valve is tricuspid. Aortic valve regurgitation is not  visualized.  10. The pulmonic valve was grossly normal. Pulmonic valve regurgitation is  trivial.  11. Normal pulmonary artery systolic pressure.  12. The inferior vena cava is normal in size with greater than 50%  respiratory variability, suggesting right atrial pressure of 3 mmHg.   Lexiscan Myoview 03/14/2019:  Equivocal ST segment changes following Lexiscan in the setting of baseline inferolateral ST-T wave abnormalities and increased voltage.  Small, mild intensity, inferior apical and mid inferolateral defects that are partially reversible in the setting of breast tissue attenuation. Consider variable soft tissue attenuation, although cannot completely exclude small, mild ischemic territories.  This is a low risk study.  Nuclear stress EF: 65%.  Assessment and Plan:  1.  Paroxysmal atrial fibrillation.  CHA2DS2-VASc score is 3.  She remains on Eliquis for stroke prophylaxis, no bleeding problems reported.  I  reviewed her recent lab work.  She does not describe any active palpitations at this time.  Continue atenolol.  2.  Vigorous LVEF with moderate asymmetric septal hypertrophy and mild LVOT gradient, cardiac murmur consistent with this.  She is asymptomatic.  3.  History of atypical chest pain, no recent recurrences.  Myoview from November 2020 was overall low risk.  4.  OSA on CPAP.  Medication Adjustments/Labs and Tests Ordered: Current medicines are reviewed at length with the patient today.  Concerns regarding medicines are outlined above.   Tests Ordered: No orders of the defined types were placed in this encounter.   Medication Changes: No orders of the defined types were placed in this encounter.   Disposition:  Follow up 6 months in the [place] office.  Signed, Satira Sark, MD, [facility] 01/10/2020 4:10 PM    [facility] at [facility] [address], [place],  [address] Phone: [phone]; Fax: [phone]

## 2020-01-10 NOTE — Patient Instructions (Signed)
Medication Instructions:  °Your physician recommends that you continue on your current medications as directed. Please refer to the Current Medication list given to you today. ° °*If you need a refill on your cardiac medications before your next appointment, please call your pharmacy* ° ° °Lab Work: °None today °If you have labs (blood work) drawn today and your tests are completely normal, you will receive your results only by: °• MyChart Message (if you have MyChart) OR °• A paper copy in the mail °If you have any lab test that is abnormal or we need to change your treatment, we will call you to review the results. ° ° °Testing/Procedures: °None today ° ° °Follow-Up: °At [facility], you and your health needs are our priority.  As part of our continuing mission to provide you with exceptional heart care, we have created designated Provider Care Teams.  These Care Teams include your primary Cardiologist (physician) and Advanced Practice Providers (APPs -  Physician Assistants and Nurse Practitioners) who all work together to provide you with the care you need, when you need it. ° °We recommend signing up for the patient portal called "MyChart".  Sign up information is provided on this After Visit Summary.  MyChart is used to connect with patients for Virtual Visits (Telemedicine).  Patients are able to view lab/test results, encounter notes, upcoming appointments, etc.  Non-urgent messages can be sent to your provider as well.   °To learn more about what you can do with MyChart, go to [email or web address].   ° °Your next appointment:   °6 month(s) ° °The format for your next appointment:   °In Person ° °Provider:   °Samuel McDowell, MD ° ° °Other Instructions °None ° ° ° ° °Thank you for choosing [facility] ! ° ° ° ° ° ° ° ° °

## 2020-01-11 ENCOUNTER — Encounter: Payer: Self-pay | Admitting: Emergency Medicine

## 2020-01-11 ENCOUNTER — Ambulatory Visit
Admission: EM | Admit: 2020-01-11 | Discharge: 2020-01-11 | Disposition: A | Discharge status: Home / Self Care | Payer: BC Managed Care – PPO | Attending: Emergency Medicine | Admitting: Emergency Medicine

## 2020-01-11 DIAGNOSIS — M545 Low back pain, unspecified: Secondary | ICD-10-CM

## 2020-01-11 LAB — POCT URINALYSIS DIP (MANUAL ENTRY)
Blood, UA: NEGATIVE
Glucose, UA: NEGATIVE mg/dL
Ketones, POC UA: NEGATIVE mg/dL
Leukocytes, UA: NEGATIVE
Nitrite, UA: NEGATIVE
Spec Grav, UA: 1.025 (ref 1.010–1.025)
Urobilinogen, UA: 0.2 E.U./dL
pH, UA: 5.5 (ref 5.0–8.0)

## 2020-01-11 MED ORDER — CYCLOBENZAPRINE HCL 5 MG PO TABS: 5.0000 mg | ORAL_TABLET | Freq: Three times a day (TID) | ORAL | 0 refills | Status: DC | PRN

## 2020-01-11 MED ORDER — KETOROLAC TROMETHAMINE 60 MG/2ML IM SOLN
30.0000 mg | Freq: Once | INTRAMUSCULAR | Status: AC
  Administered 2020-01-11: 30 mg via INTRAMUSCULAR

## 2020-01-11 MED ORDER — ACETAMINOPHEN 500 MG PO TABS: 500.0000 mg | ORAL_TABLET | Freq: Four times a day (QID) | ORAL | 0 refills | Status: AC | PRN

## 2020-01-11 MED ORDER — METHYLPREDNISOLONE SODIUM SUCC 125 MG IJ SOLR
80.0000 mg | Freq: Once | INTRAMUSCULAR | Status: AC
  Administered 2020-01-11: 80 mg via INTRAMUSCULAR

## 2020-01-11 MED ORDER — PREDNISONE 10 MG PO TABS: 20.0000 mg | ORAL_TABLET | Freq: Every day | ORAL | 0 refills | Status: DC

## 2020-01-11 NOTE — ED Provider Notes (Addendum)
[facility]   [id number] 01/11/20 Arrival Time: 0901   Chief Complaint  Patient presents with  . Back Pain     SUBJECTIVE: History from: patient.  Monique Gomez is a 64 y.o. female who presented to the urgent care for complaint of low back pain that started this past Thursday.  Reports she has been picking up boxes at work.  She localizes the pain to the bilateral low back.  She describes the pain as constant and achy.  She has tried OTC medications without relief.  Her symptoms are made worse with ROM.  She denies similar symptoms in the past.  Directed, fever, nausea, vomiting, diarrhea   ROS: As per HPI.  All other pertinent ROS negative.      Past Medical History:  Diagnosis Date  . Abdominal fibromatosis   . Allergic rhinitis   . Anemia   . Asthma   . Essential hypertension   . GERD (gastroesophageal reflux disease)   . Headache(784.0)   . History of cardiac catheterization    No significant CAD May 2015  . History of migraine headaches   . Hyperlipidemia   . Migraines   . Obesity   . PAF (paroxysmal atrial fibrillation) ([place]) 08/2013  . Sleep apnea   . Type 2 diabetes mellitus ([place])    Past Surgical History:  Procedure Laterality Date  . CATARACT EXTRACTION    . CATARACT EXTRACTION W/PHACO Right 04/05/2019   Procedure: CATARACT EXTRACTION PHACO AND INTRAOCULAR LENS PLACEMENT RIGHT EYE (CDE: 3.19);  Surgeon: Baruch Goldmann, MD;  Location: [facility];  Service: Ophthalmology;  Laterality: Right;  . COLONOSCOPY N/A 02/02/2015   SLF: 1. one colon polyp removed-no source for anemia identified. 2. moderate diverticulosis noted in the sigmoid colon and descending colon 3. the left colon is redundant 4. Rectal bleeding due ot small internal hemorroids 5. Moderate sized external hemorrhoids.  . ESOPHAGOGASTRODUODENOSCOPY N/A 02/02/2015   SLF: 1. 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 large hiatal hernia 3. mild non-erosive gastritis 4. No source for  anemia identified.   Marland Kitchen LEFT HEART CATHETERIZATION WITH CORONARY ANGIOGRAM N/A 09/03/2013   Procedure: LEFT HEART CATHETERIZATION WITH CORONARY ANGIOGRAM;  Surgeon: Jettie Booze, MD;  Location[facility] C[facility] LAB;  Service: Cardiovascular;  Laterality: N/A;  . Right carpal tunnel release     Allergies  Allergen Reactions  . Venlafaxine Nausea And Vomiting and Other (See Comments)    Dizziness, shakiness   No current facility-administered medications on file prior to encounter.   Current Outpatient Medications on File Prior to Encounter  Medication Sig Dispense Refill  . AIMOVIG 70 MG/ML SOAJ Inject 70 mg into the skin every 30 (thirty) days. 1 pen 11  . apixaban (ELIQUIS) 5 MG TABS tablet Take 1 tablet (5 mg total) by mouth 2 (two) times daily. 180 tablet 3  . atenolol (TENORMIN) 50 MG tablet Take 1 tablet by mouth once daily 90 tablet 0  . atorvastatin (LIPITOR) 80 MG tablet Take 1 tablet (80 mg total) by mouth daily. 90 tablet 3  . fluticasone (FLONASE) 50 MCG/ACT nasal spray Place 2 sprays into both nostrils daily.    Marland Kitchen ipratropium (ATROVENT) 0.03 % nasal spray Place 2 sprays into both nostrils every 12 (twelve) hours. 30 mL 3  . Iron-FA-B Cmp-C-Biot-Probiotic (FUSION PLUS) CAPS Take 2 tablets by mouth daily. 60 capsule 3  . lisinopril (ZESTRIL) 40 MG tablet Take 1 tablet (40 mg total) by mouth daily. 90 tablet 3  . metFORMIN (  GLUCOPHAGE) 500 MG tablet Take 1 tablet (500 mg total) by mouth 2 (two) times daily with a meal. 180 tablet 3  . metoCLOPramide (REGLAN) 10 MG tablet Take 1 tablet (10 mg total) by mouth every 8 (eight) hours as needed for up to 7 days for nausea (Headache). 21 tablet 0  . ondansetron (ZOFRAN ODT) 4 MG disintegrating tablet Take 1 tablet (4 mg total) by mouth every 8 (eight) hours as needed. 20 tablet 6  . pantoprazole (PROTONIX) 40 MG tablet Take 1 tablet by mouth twice daily 180 tablet 0  . Semaglutide 7 MG TABS Take 7 mg by mouth daily before breakfast. 30 tablet 2   . SUMAtriptan (IMITREX) 6 MG/0.5ML SOLN injection Inject 0.5 mLs (6 mg total) into the skin every 2 (two) hours as needed for migraine or headache. May repeat in 2 hours if headache persists or recurs. 6 mL 6  . tiZANidine (ZANAFLEX) 4 MG tablet Take 1 tablet (4 mg total) by mouth every 6 (six) hours as needed for muscle spasms. 30 tablet 0  . topiramate (TOPAMAX) 50 MG tablet Take 1 tablet (50 mg total) by mouth at bedtime. Please call [phone] to schedule appt for continued refills. 90 tablet 0   Social History   Socioeconomic History  . Marital status: Single    Spouse name: Not on file  . Number of children: 2  . Years of education: Not on file  . Highest education level: Not on file  Occupational History  . Occupation: Training and development officer: [employer]  Tobacco Use  . Smoking status: Former Smoker    Packs/day: 0.30    Types: Cigarettes    Quit date: 04/01/2012    Years since quitting: 7.7  . Smokeless tobacco: Never Used  Vaping Use  . Vaping Use: Never used  Substance and Sexual Activity  . Alcohol use: Yes    Alcohol/week: 0.0 standard drinks    Comment: very occasional  . Drug use: No  . Sexual activity: Yes  Other Topics Concern  . Not on file  Social History Narrative  . Not on file   Social Determinants of Health   Financial Resource Strain:   . Difficulty of Paying Living Expenses: Not on file  Food Insecurity:   . Worried About Charity fundraiser in the Last Year: Not on file  . Ran Out of Food in the Last Year: Not on file  Transportation Needs:   . Lack of Transportation (Medical): Not on file  . Lack of Transportation (Non-Medical): Not on file  Physical Activity:   . Days of Exercise per Week: Not on file  . Minutes of Exercise per Session: Not on file  Stress:   . Feeling of Stress : Not on file  Social Connections:   . Frequency of Communication with Friends and Family: Not on file  . Frequency of Social Gatherings with Friends and  Family: Not on file  . Attends Religious Services: Not on file  . Active Member of Clubs or Organizations: Not on file  . Attends Archivist Meetings: Not on file  . Marital Status: Not on file  Intimate Partner Violence:   . Fear of Current or Ex-Partner: Not on file  . Emotionally Abused: Not on file  . Physically Abused: Not on file  . Sexually Abused: Not on file   Family History  Problem Relation Age of Onset  . Diabetes Mother   . Hypertension Mother   .  Heart disease Mother   . High Cholesterol Mother   . Alzheimer's disease Father   . Hypertension Sister   . Hypertension Brother   . Diabetes Brother   . Arthritis Sister   . Hypertension Sister   . High Cholesterol Sister   . Diabetes Son   . Developmental delay Son   . Colon cancer Neg Hx     OBJECTIVE:  Vitals:   01/11/20 0912 01/11/20 0914  BP: (!) 147/92   Pulse: 60   Resp: 20   Temp: 98.4 F (36.9 C)   TempSrc: Oral   SpO2: 98%   Weight:  278 lb (126.1 kg)  Height:  5\' 2"  (1.575 m)     Physical Exam Vitals and nursing note reviewed.  Constitutional:      General: She is not in acute distress.    Appearance: Normal appearance. She is normal weight. She is not ill-appearing, toxic-appearing or diaphoretic.  HENT:     Head: Normocephalic.  Cardiovascular:     Rate and Rhythm: Normal rate and regular rhythm.     Pulses: Normal pulses.     Heart sounds: Normal heart sounds. No murmur heard.  No friction rub. No gallop.   Pulmonary:     Effort: Pulmonary effort is normal. No respiratory distress.     Breath sounds: Normal breath sounds. No stridor. No wheezing, rhonchi or rales.  Chest:     Chest wall: No tenderness.  Musculoskeletal:        General: Tenderness present.     Comments: Back:  Patient ambulates from chair to exam table without difficulty.  Inspection: Skin clear and intact without obvious swelling, erythema, or ecchymosis. Warm to the touch  Palpation: Vertebral processes  nontender. Tenderness about the lower bilateral paravertebral muscles  ROM: FROM Strength: 5/5 hip flexion, 5/5 knee extension, 5/5 knee flexion, 5/5 plantar flexion, 5/5 dorsiflexion  DTR: Patellar tendon reflex intact  Special Tests: Negative Straight leg raise  Neurological:     Mental Status: She is alert and oriented to person, place, and time.     LABS:  Results for orders placed or performed during the hospital encounter of 01/11/20 (from the past 24 hour(s))  POCT urinalysis dipstick     Status: Abnormal   Collection Time: 01/11/20  9:58 AM  Result Value Ref Range   Color, UA yellow yellow   Clarity, UA clear clear   Glucose, UA negative negative mg/dL   Bilirubin, UA small (A) negative   Ketones, POC UA negative negative mg/dL   Spec Grav, UA 1.025 1.010 - 1.025   Blood, UA negative negative   pH, UA 5.5 5.0 - 8.0   Protein Ur, POC trace (A) negative mg/dL   Urobilinogen, UA 0.2 0.2 or 1.0 E.U./dL   Nitrite, UA Negative Negative   Leukocytes, UA Negative Negative     ASSESSMENT & PLAN:  1. Acute bilateral low back pain without sciatica     Meds ordered this encounter  Medications  . acetaminophen (TYLENOL) 500 MG tablet    Sig: Take 1 tablet (500 mg total) by mouth every 6 (six) hours as needed.    Dispense:  30 tablet    Refill:  0  . cyclobenzaprine (FLEXERIL) 5 MG tablet    Sig: Take 1 tablet (5 mg total) by mouth 3 (three) times daily as needed.    Dispense:  30 tablet    Refill:  0  . predniSONE (DELTASONE) 10 MG tablet  Sig: Take 2 tablets (20 mg total) by mouth daily.    Dispense:  15 tablet    Refill:  0  . methylPREDNISolone sodium succinate (SOLU-MEDROL) 125 mg/2 mL injection 80 mg  . ketorolac (TORADOL) injection 30 mg   Discharge instructions  Urine culture was sent/ we will call you if the results are abnormal  Rest, ice and heat as needed Ensure adequate ROM as tolerated. Prescribed Tylenol as needed for pain relief Prescribed  flexeril  for muscle spasm.  Do not drive or operate heavy machinery while taking this medication Prescribed prednisone for inflammation Return here or go to ER if you have any new or worsening symptoms such as numbness/tingling of the inner thighs, loss of bladder or bowel control, headache/blurry vision, nausea/vomiting, confusion/altered mental status, dizziness, weakness, passing out, imbalance, etc...   Reviewed expectations re: course of current medical issues. Questions answered. Outlined signs and symptoms indicating need for more acute intervention. Patient verbalized understanding. After Visit Summary given.      Note: This document was prepared using Dragon voice recognition software and may include unintentional dictation errors.    Emerson Monte, FNP 01/11/20 1002    Emerson Monte, FNP 01/11/20 1005

## 2020-01-11 NOTE — ED Triage Notes (Signed)
Lower back pain since Thursday.  Reports picking up boxes at work on Wednesday.  Denies burning or urinary frequency

## 2020-01-11 NOTE — Discharge Instructions (Addendum)
Urine culture was sent/ we will call you if the results are abnormal  Rest, ice and heat as needed Ensure adequate ROM as tolerated. Prescribed Tylenol as needed for pain relief Prescribed flexeril  for muscle spasm.  Do not drive or operate heavy machinery while taking this medication Prescribed prednisone for inflammation Return here or go to ER if you have any new or worsening symptoms such as numbness/tingling of the inner thighs, loss of bladder or bowel control, headache/blurry vision, nausea/vomiting, confusion/altered mental status, dizziness, weakness, passing out, imbalance, etc..Marland Kitchen

## 2020-01-12 LAB — URINE CULTURE

## 2020-01-14 ENCOUNTER — Encounter (HOSPITAL_COMMUNITY): Payer: BC Managed Care – PPO | Admitting: Physical Therapy

## 2020-01-14 ENCOUNTER — Telehealth (HOSPITAL_COMMUNITY): Payer: Self-pay

## 2020-01-14 ENCOUNTER — Ambulatory Visit (HOSPITAL_COMMUNITY): Payer: BC Managed Care – PPO

## 2020-01-14 DIAGNOSIS — R519 Headache, unspecified: Secondary | ICD-10-CM | POA: Diagnosis not present

## 2020-01-14 DIAGNOSIS — J343 Hypertrophy of nasal turbinates: Secondary | ICD-10-CM | POA: Diagnosis not present

## 2020-01-14 DIAGNOSIS — J320 Chronic maxillary sinusitis: Secondary | ICD-10-CM | POA: Diagnosis not present

## 2020-01-14 DIAGNOSIS — J310 Chronic rhinitis: Secondary | ICD-10-CM | POA: Diagnosis not present

## 2020-01-14 NOTE — Telephone Encounter (Signed)
pt cancelled appt for today because she has an appt in [place] at 4:30pm and she will not be back in time

## 2020-01-15 ENCOUNTER — Ambulatory Visit (HOSPITAL_COMMUNITY): Payer: BC Managed Care – PPO

## 2020-01-15 ENCOUNTER — Encounter (HOSPITAL_COMMUNITY): Payer: Self-pay

## 2020-01-15 ENCOUNTER — Inpatient Hospital Stay (HOSPITAL_COMMUNITY): Payer: BC Managed Care – PPO | Attending: Hematology | Admitting: Hematology

## 2020-01-15 ENCOUNTER — Encounter: Payer: Self-pay | Admitting: Orthopedic Surgery

## 2020-01-15 ENCOUNTER — Other Ambulatory Visit: Payer: Self-pay

## 2020-01-15 ENCOUNTER — Ambulatory Visit (INDEPENDENT_AMBULATORY_CARE_PROVIDER_SITE_OTHER): Payer: BC Managed Care – PPO | Admitting: Orthopedic Surgery

## 2020-01-15 ENCOUNTER — Other Ambulatory Visit: Payer: Self-pay | Admitting: Otolaryngology

## 2020-01-15 ENCOUNTER — Other Ambulatory Visit (HOSPITAL_COMMUNITY): Payer: Self-pay | Admitting: Otolaryngology

## 2020-01-15 ENCOUNTER — Telehealth: Payer: Self-pay | Admitting: Neurology

## 2020-01-15 VITALS — BP 145/99 | HR 81 | Ht 62.0 in | Wt 270.0 lb

## 2020-01-15 VITALS — BP 142/72 | HR 81 | Temp 96.8°F | Resp 19 | Wt 275.4 lb

## 2020-01-15 DIAGNOSIS — Z87891 Personal history of nicotine dependence: Secondary | ICD-10-CM | POA: Diagnosis not present

## 2020-01-15 DIAGNOSIS — E119 Type 2 diabetes mellitus without complications: Secondary | ICD-10-CM | POA: Diagnosis not present

## 2020-01-15 DIAGNOSIS — Z7952 Long term (current) use of systemic steroids: Secondary | ICD-10-CM | POA: Diagnosis not present

## 2020-01-15 DIAGNOSIS — Z7984 Long term (current) use of oral hypoglycemic drugs: Secondary | ICD-10-CM | POA: Diagnosis not present

## 2020-01-15 DIAGNOSIS — R591 Generalized enlarged lymph nodes: Secondary | ICD-10-CM | POA: Diagnosis not present

## 2020-01-15 DIAGNOSIS — E785 Hyperlipidemia, unspecified: Secondary | ICD-10-CM | POA: Insufficient documentation

## 2020-01-15 DIAGNOSIS — K219 Gastro-esophageal reflux disease without esophagitis: Secondary | ICD-10-CM | POA: Insufficient documentation

## 2020-01-15 DIAGNOSIS — M542 Cervicalgia: Secondary | ICD-10-CM

## 2020-01-15 DIAGNOSIS — Z79899 Other long term (current) drug therapy: Secondary | ICD-10-CM | POA: Insufficient documentation

## 2020-01-15 DIAGNOSIS — Z8249 Family history of ischemic heart disease and other diseases of the circulatory system: Secondary | ICD-10-CM | POA: Insufficient documentation

## 2020-01-15 DIAGNOSIS — J45909 Unspecified asthma, uncomplicated: Secondary | ICD-10-CM | POA: Insufficient documentation

## 2020-01-15 DIAGNOSIS — Z7901 Long term (current) use of anticoagulants: Secondary | ICD-10-CM | POA: Diagnosis not present

## 2020-01-15 DIAGNOSIS — I480 Paroxysmal atrial fibrillation: Secondary | ICD-10-CM | POA: Insufficient documentation

## 2020-01-15 DIAGNOSIS — Z6841 Body Mass Index (BMI) 40.0 and over, adult: Secondary | ICD-10-CM | POA: Diagnosis not present

## 2020-01-15 DIAGNOSIS — Z8261 Family history of arthritis: Secondary | ICD-10-CM | POA: Insufficient documentation

## 2020-01-15 DIAGNOSIS — Z8349 Family history of other endocrine, nutritional and metabolic diseases: Secondary | ICD-10-CM | POA: Diagnosis not present

## 2020-01-15 DIAGNOSIS — G4730 Sleep apnea, unspecified: Secondary | ICD-10-CM | POA: Diagnosis not present

## 2020-01-15 DIAGNOSIS — M549 Dorsalgia, unspecified: Secondary | ICD-10-CM | POA: Insufficient documentation

## 2020-01-15 DIAGNOSIS — I10 Essential (primary) hypertension: Secondary | ICD-10-CM | POA: Insufficient documentation

## 2020-01-15 DIAGNOSIS — Z833 Family history of diabetes mellitus: Secondary | ICD-10-CM | POA: Diagnosis not present

## 2020-01-15 DIAGNOSIS — J329 Chronic sinusitis, unspecified: Secondary | ICD-10-CM

## 2020-01-15 DIAGNOSIS — M7542 Impingement syndrome of left shoulder: Secondary | ICD-10-CM

## 2020-01-15 DIAGNOSIS — E669 Obesity, unspecified: Secondary | ICD-10-CM | POA: Diagnosis not present

## 2020-01-15 DIAGNOSIS — R718 Other abnormality of red blood cells: Secondary | ICD-10-CM | POA: Insufficient documentation

## 2020-01-15 NOTE — Telephone Encounter (Signed)
Pt called needing to speak to an RN to see if her AIMOVIG 70 MG/ML SOAJ and her topiramate (TOPAMAX) 50 MG tablet can be increased due to the headache she has been having. Please advise.

## 2020-01-15 NOTE — Progress Notes (Signed)
[place],  [id number]   CLINIC:  Medical Oncology/Hematology  PCP:  Alycia Rossetti, [place] / [place] [address]  [phone]  REASON FOR VISIT:  Follow-up for lymphadenopathy  PRIOR THERAPY: None  CURRENT THERAPY: Observation  INTERVAL HISTORY:  [name], a 64 y.o. female, returns for routine follow-up for her lymphadenopathy. Monique Gomez was last seen on 10/15/2019.  Today she reports that she has been dealing with allergies recently, which she notes has caused her lymph nodes to swell. She reports that she needed to get Benadryl, one course of antibiotics and prednisone in the past 3 months to deal with her stuffed sinuses and allergic symptoms; she is currently taking prednisone for her back pain. She is taking an iron supplement daily; she is no longer menstruating.   REVIEW OF SYSTEMS:  Review of Systems  Constitutional: Positive for fatigue (mild). Negative for appetite change.  Respiratory: Positive for cough and shortness of breath.   Gastrointestinal: Positive for nausea.  Musculoskeletal: Positive for back pain (5/10 L mid back pain).  Neurological: Positive for headaches.  All other systems reviewed and are negative.   PAST MEDICAL/SURGICAL HISTORY:  Past Medical History:  Diagnosis Date  . Abdominal fibromatosis   . Allergic rhinitis   . Anemia   . Asthma   . Essential hypertension   . GERD (gastroesophageal reflux disease)   . Headache(784.0)   . History of cardiac catheterization    No significant CAD May 2015  . History of migraine headaches   . Hyperlipidemia   . Migraines   . Obesity   . PAF (paroxysmal atrial fibrillation) ([place]) 08/2013  . Sleep apnea   . Type 2 diabetes mellitus ([place])    Past Surgical History:  Procedure Laterality Date  . CATARACT EXTRACTION    . CATARACT EXTRACTION W/PHACO Right 04/05/2019   Procedure: CATARACT EXTRACTION PHACO AND INTRAOCULAR LENS  PLACEMENT RIGHT EYE (CDE: 3.19);  Surgeon: Baruch Goldmann, MD;  Location: [facility];  Service: Ophthalmology;  Laterality: Right;  . COLONOSCOPY N/A 02/02/2015   SLF: 1. one colon polyp removed-no source for anemia identified. 2. moderate diverticulosis noted in the sigmoid colon and descending colon 3. the left colon is redundant 4. Rectal bleeding due ot small internal hemorroids 5. Moderate sized external hemorrhoids.  . ESOPHAGOGASTRODUODENOSCOPY N/A 02/02/2015   SLF: 1. 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 large hiatal hernia 3. mild non-erosive gastritis 4. No source for anemia identified.   Marland Kitchen LEFT HEART CATHETERIZATION WITH CORONARY ANGIOGRAM N/A 09/03/2013   Procedure: LEFT HEART CATHETERIZATION WITH CORONARY ANGIOGRAM;  Surgeon: Jettie Booze, MD;  Location: [facility] CATH LAB;  Service: Cardiovascular;  Laterality: N/A;  . Right carpal tunnel release      SOCIAL HISTORY:  Social History   Socioeconomic History  . Marital status: Single    Spouse name: Not on file  . Number of children: 2  . Years of education: Not on file  . Highest education level: Not on file  Occupational History  . Occupation: Training and development officer: [employer]  Tobacco Use  . Smoking status: Former Smoker    Packs/day: 0.30    Types: Cigarettes    Quit date: 04/01/2012    Years since quitting: 7.7  . Smokeless tobacco: Never Used  Vaping Use  . Vaping Use: Never used  Substance and Sexual Activity  . Alcohol use: Yes  Alcohol/week: 0.0 standard drinks    Comment: very occasional  . Drug use: No  . Sexual activity: Yes  Other Topics Concern  . Not on file  Social History Narrative  . Not on file   Social Determinants of Health   Financial Resource Strain:   . Difficulty of Paying Living Expenses: Not on file  Food Insecurity:   . Worried About Charity fundraiser in the Last Year: Not on file  . Ran Out of Food in the Last Year: Not on file  Transportation Needs:   .  Lack of Transportation (Medical): Not on file  . Lack of Transportation (Non-Medical): Not on file  Physical Activity:   . Days of Exercise per Week: Not on file  . Minutes of Exercise per Session: Not on file  Stress:   . Feeling of Stress : Not on file  Social Connections:   . Frequency of Communication with Friends and Family: Not on file  . Frequency of Social Gatherings with Friends and Family: Not on file  . Attends Religious Services: Not on file  . Active Member of Clubs or Organizations: Not on file  . Attends Archivist Meetings: Not on file  . Marital Status: Not on file  Intimate Partner Violence:   . Fear of Current or Ex-Partner: Not on file  . Emotionally Abused: Not on file  . Physically Abused: Not on file  . Sexually Abused: Not on file    FAMILY HISTORY:  Family History  Problem Relation Age of Onset  . Diabetes Mother   . Hypertension Mother   . Heart disease Mother   . High Cholesterol Mother   . Alzheimer's disease Father   . Hypertension Sister   . Hypertension Brother   . Diabetes Brother   . Arthritis Sister   . Hypertension Sister   . High Cholesterol Sister   . Diabetes Son   . Developmental delay Son   . Colon cancer Neg Hx     CURRENT MEDICATIONS:  Current Outpatient Medications  Medication Sig Dispense Refill  . acetaminophen (TYLENOL) 500 MG tablet Take 1 tablet (500 mg total) by mouth every 6 (six) hours as needed. 30 tablet 0  . AIMOVIG 70 MG/ML SOAJ Inject 70 mg into the skin every 30 (thirty) days. 1 pen 11  . apixaban (ELIQUIS) 5 MG TABS tablet Take 1 tablet (5 mg total) by mouth 2 (two) times daily. 180 tablet 3  . atenolol (TENORMIN) 50 MG tablet Take 1 tablet by mouth once daily 90 tablet 0  . atorvastatin (LIPITOR) 80 MG tablet Take 1 tablet (80 mg total) by mouth daily. 90 tablet 3  . cyclobenzaprine (FLEXERIL) 5 MG tablet Take 1 tablet (5 mg total) by mouth 3 (three) times daily as needed. 30 tablet 0  . fluticasone  (FLONASE) 50 MCG/ACT nasal spray Place 2 sprays into both nostrils daily.    Marland Kitchen ipratropium (ATROVENT) 0.03 % nasal spray Place 2 sprays into both nostrils every 12 (twelve) hours. 30 mL 3  . Iron-FA-B Cmp-C-Biot-Probiotic (FUSION PLUS) CAPS Take 2 tablets by mouth daily. 60 capsule 3  . lisinopril (ZESTRIL) 40 MG tablet Take 1 tablet (40 mg total) by mouth daily. 90 tablet 3  . metFORMIN (GLUCOPHAGE) 500 MG tablet Take 1 tablet (500 mg total) by mouth 2 (two) times daily with a meal. 180 tablet 3  . metoCLOPramide (REGLAN) 10 MG tablet Take 1 tablet (10 mg total) by mouth every 8 (eight)  hours as needed for up to 7 days for nausea (Headache). 21 tablet 0  . ondansetron (ZOFRAN ODT) 4 MG disintegrating tablet Take 1 tablet (4 mg total) by mouth every 8 (eight) hours as needed. 20 tablet 6  . pantoprazole (PROTONIX) 40 MG tablet Take 1 tablet by mouth twice daily 180 tablet 0  . predniSONE (DELTASONE) 10 MG tablet Take 2 tablets (20 mg total) by mouth daily. 15 tablet 0  . Semaglutide 7 MG TABS Take 7 mg by mouth daily before breakfast. 30 tablet 2  . SUMAtriptan (IMITREX) 6 MG/0.5ML SOLN injection Inject 0.5 mLs (6 mg total) into the skin every 2 (two) hours as needed for migraine or headache. May repeat in 2 hours if headache persists or recurs. 6 mL 6  . tiZANidine (ZANAFLEX) 4 MG tablet Take 1 tablet (4 mg total) by mouth every 6 (six) hours as needed for muscle spasms. 30 tablet 0  . topiramate (TOPAMAX) 50 MG tablet Take 1 tablet (50 mg total) by mouth at bedtime. Please call [phone] to schedule appt for continued refills. 90 tablet 0   No current facility-administered medications for this visit.    ALLERGIES:  Allergies  Allergen Reactions  . Venlafaxine Nausea And Vomiting and Other (See Comments)    Dizziness, shakiness    PHYSICAL EXAM:  Performance status (ECOG): 0 - Asymptomatic  Vitals:   01/15/20 0758  BP: (!) 142/72  Pulse: 81  Resp: 19  Temp: (!) 96.8 F (36 C)  SpO2:  100%   Wt Readings from Last 3 Encounters:  01/15/20 275 lb 6.4 oz (124.9 kg)  01/11/20 278 lb (126.1 kg)  01/10/20 278 lb (126.1 kg)   Physical Exam Vitals reviewed.  Constitutional:      Appearance: Normal appearance. She is obese.  Cardiovascular:     Rate and Rhythm: Normal rate and regular rhythm.     Pulses: Normal pulses.     Heart sounds: Normal heart sounds.  Pulmonary:     Effort: Pulmonary effort is normal.     Breath sounds: Normal breath sounds.  Lymphadenopathy:     Cervical: No cervical adenopathy.     Upper Body:     Right upper body: No supraclavicular, axillary or pectoral adenopathy.     Left upper body: No supraclavicular, axillary or pectoral adenopathy.  Neurological:     General: No focal deficit present.     Mental Status: She is alert and oriented to person, place, and time.  Psychiatric:        Mood and Affect: Mood normal.        Behavior: Behavior normal.     LABORATORY DATA:  I have reviewed the labs as listed.  CBC Latest Ref Rng & Units 12/18/2019 10/15/2019 08/21/2019  WBC 3.8 - 10.8 Thousand/uL 9.2 8.8 9.0  Hemoglobin 11.7 - 15.5 g/dL 10.9(L) 11.0(L) 11.5(L)  Hematocrit 35 - 45 % 36.9 37.5 38.3  Platelets 140 - 400 Thousand/uL 310 352 317   CMP Latest Ref Rng & Units 12/18/2019 10/15/2019 08/21/2019  Glucose 65 - 99 mg/dL 110(H) 121(H) 101(H)  BUN 7 - 25 mg/dL 21 15 17   Creatinine 0.50 - 0.99 mg/dL 0.84 0.85 0.92  Sodium 135 - 146 mmol/L 142 142 142  Potassium 3.5 - 5.3 mmol/L 4.4 4.3 4.0  Chloride 98 - 110 mmol/L 109 107 110  CO2 20 - 32 mmol/L 25 26 24   Calcium 8.6 - 10.4 mg/dL 9.6 9.4 9.7  Total Protein 6.1 - 8.1  g/dL 6.6 - 6.9  Total Bilirubin 0.2 - 1.2 mg/dL 0.2 - 0.3  Alkaline Phos 38 - 126 U/L - - -  AST 10 - 35 U/L 12 - 10  ALT 6 - 29 U/L 10 - 8      Component Value Date/Time   RBC 5.11 (H) 12/18/2019 0822   MCV 72.2 (L) 12/18/2019 0822   MCV 75 (L) 07/17/2019 1148   MCH 21.3 (L) 12/18/2019 0822   MCHC 29.5 (L) 12/18/2019  0822   RDW 16.2 (H) 12/18/2019 0822   RDW 14.7 07/17/2019 1148   LYMPHSABS 2,015 12/18/2019 0822   LYMPHSABS 1.8 07/17/2019 1148   MONOABS 0.8 10/15/2019 0850   EOSABS 184 12/18/2019 0822   EOSABS 0.2 07/17/2019 1148   BASOSABS 37 12/18/2019 0822   BASOSABS 0.0 07/17/2019 1148   Lab Results  Component Value Date   VD25OH 28 (L) 12/18/2019   VD25OH 10.0 (L) 07/17/2019    DIAGNOSTIC IMAGING:  I have independently reviewed the scans and discussed with the patient. No results found.   ASSESSMENT:  1.  Subcentimeter left supraclavicular adenopathy: -She reported lymphadenopathy for the past 6 months.  She reported onset of sinus infections. -Ultrasound soft tissue neck on 01/25/2019 shows left supraclavicular lymph node measuring 1 cm.  A CT of the soft tissue of the neck on the same day showed 3 small supraclavicular lymph nodes, largest measuring 8-9 mm.  Small posterior triangle lymph node, none more than 9 mm.  Consistent with mild reactive adenitis. -CT soft tissue neck with contrast on 02/22/2019 showed right level 2 lymph node measuring 6 mm unchanged.  Subcentimeter posterior triangle lymph nodes on the right unchanged.  Largest measures 8 mm.  Left level 2 lymph node measures 8.6 mm unchanged.  Cluster of supraclavicular lymph nodes on the left also slightly smaller.  Largest measures 5 mm.  No oropharyngeal masses. -CT soft tissue neck on 06/17/2019 shows left level 2 lymph node measuring 1.2 cm, previously 1.1 cm.  Stable cluster of nonenlarged left supraclavicular lymph nodes. -She smoked 1 pack of cigarettes per week for 35 years.  She quit smoking in 2013.   PLAN:  1.  Waxing and waning neck and supraclavicular adenopathy: -She did not experience any fevers, night sweats or weight loss in the last 3 months. -She reportedly had a lot of sinus infections and allergies in the last 3 months.  She was treated with antibiotics once. -She is currently taking steroids for back  pain. -Physical examination today did not reveal any palpable adenopathy in the bilateral supraclavicular regions or both sides of the neck.  No axillary adenopathy. -I plan to see her back in 6 months for follow-up.  She was told to come back sooner should she develop any palpable or painful adenopathy.  Will consider PET scan at that time.  2.  Microcytosis: -Her hemoglobin is around 11.  MCV is 72-73. -I have checked hemoglobin electrophoresis which was normal. -She is taking iron tablet daily.  I plan to repeat CBC and iron panel at next visit.  Orders placed this encounter:  No orders of the defined types were placed in this encounter.    Derek Jack, MD [place] [phone]   I, Milinda Antis, am acting as a scribe for Dr. Sanda Linger.  I, Derek Jack MD, have reviewed the above documentation for accuracy and completeness, and I agree with the above.

## 2020-01-15 NOTE — Patient Instructions (Signed)
[facility] at [facility] Discharge Instructions  You were seen today by Dr. Katragadda. He went over your recent results. Dr. Katragadda will see you back in 6 months for labs and follow up.   Thank you for choosing [facility] at [facility] to provide your oncology and hematology care.  To afford each patient quality time with our provider, please arrive at least 15 minutes before your scheduled appointment time.   If you have a lab appointment with the Cancer Center please come in thru the Main Entrance and check in at the main information desk  You need to re-schedule your appointment should you arrive 10 or more minutes late.  We strive to give you quality time with our providers, and arriving late affects you and other patients whose appointments are after yours.  Also, if you no show three or more times for appointments you may be dismissed from the clinic at the providers discretion.     Again, thank you for choosing [facility].  Our hope is that these requests will decrease the amount of time that you wait before being seen by our physicians.       _____________________________________________________________  Should you have questions after your visit to [facility], please contact our office at [phone] between the hours of 8:00 a.m. and 4:30 p.m.  Voicemails left after 4:00 p.m. will not be returned until the following business day.  For prescription refill requests, have your pharmacy contact our office and allow 72 hours.    Cancer Center Support Programs:   > Cancer Support Group  2nd Tuesday of the month 1pm-2pm, Journey Room    

## 2020-01-15 NOTE — Progress Notes (Addendum)
Follow up   Chief Complaint  Patient presents with  . Shoulder Pain    left/ better with therapy / really helps    64 year old female followed for neck pain and shoulder pain status post left subacromial injection and a course of physical therapy neck and shoulder  She says her neck and shoulder a bit improved significantly with therapy and are pretty much back to normal  However, she had an episode of acute lower back pain started last Wednesday when she bent over to pick something up by Saturday she had to go to urgent care she was given an injection for pain some medication to take at home including a muscle relaxer and steroids and says that the pain is significantly improved  She denies any numbness tingling bowel or bladder dysfunction leg pain or radiation  The patient meets the AMA guidelines for Morbid (severe) obesity with a BMI > 40.0 and I have recommended weight loss.  Encounter Diagnoses  Name Primary?  . Neck pain Yes  . Shoulder impingement, left   . Body mass index 50.0-59.9, adult ([place])   . Morbid obesity ([place])     Neck and shoulder continue therapy  Back pain continue medications prescribed by the urgent care I advised her to call us back if she gets any numbness or tingling in her legs or any radicular symptoms

## 2020-01-15 NOTE — Telephone Encounter (Signed)
I called pt she has been having daily headaches (even on awakening).  She is takes tylenol on a daily basis.  I relayed that she could be having rebound headaches.  She saw ENT for she was having pressure (sinus) and some vertigo sx.  ENT stated it was her migraines.  I mad appt for her 01-20-20 at 1315.  Arrive 30 min prior to appt.  She verbalized understanding.  She takes aimovig 70mg  /ml and topamax 50mg  po qhs.

## 2020-01-15 NOTE — Therapy (Addendum)
[place], [place], [address] Phone: [phone]   Fax:  [phone]  Physical Therapy Treatment Progress Note  As a licensed physical therapist I have read and agree with the following note.   Clarene Critchley PT, DPT 8:44 AM, 01/16/20 [phone]    Reporting Period 8/3 to 01/15/20  See note below for Objective Data and Assessment of Progress/Goals.      Patient Details  Name: Monique Gomez MRN: [id number] Date of Birth: 28-Aug-1955 Referring Provider (PT): Arther Abbott, MD   Encounter Date: 01/15/2020   PT End of Session - 01/15/20 1800    Visit Number 4    Number of Visits 12    Date for PT Re-Evaluation 02/05/20   Authorization Type BCBS (No auth required; 30 visit limit)    Authorization - Visit Number 4    Authorization - Number of Visits 30    Progress Note Due on Visit 10    PT Start Time 4854    PT Stop Time 6270    PT Time Calculation (min) 41 min    Activity Tolerance Patient tolerated treatment well;No increased pain    Behavior During Therapy WFL for tasks assessed/performed           Past Medical History:  Diagnosis Date  . Abdominal fibromatosis   . Allergic rhinitis   . Anemia   . Asthma   . Essential hypertension   . GERD (gastroesophageal reflux disease)   . Headache(784.0)   . History of cardiac catheterization    No significant CAD May 2015  . History of migraine headaches   . Hyperlipidemia   . Migraines   . Obesity   . PAF (paroxysmal atrial fibrillation) ([place]) 08/2013  . Sleep apnea   . Type 2 diabetes mellitus ([place])     Past Surgical History:  Procedure Laterality Date  . CATARACT EXTRACTION    . CATARACT EXTRACTION W/PHACO Right 04/05/2019   Procedure: CATARACT EXTRACTION PHACO AND INTRAOCULAR LENS PLACEMENT RIGHT EYE (CDE: 3.19);  Surgeon: Baruch Goldmann, MD;  Location: [facility];  Service: Ophthalmology;  Laterality: Right;  . COLONOSCOPY N/A 02/02/2015    SLF: 1. one colon polyp removed-no source for anemia identified. 2. moderate diverticulosis noted in the sigmoid colon and descending colon 3. the left colon is redundant 4. Rectal bleeding due ot small internal hemorroids 5. Moderate sized external hemorrhoids.  . ESOPHAGOGASTRODUODENOSCOPY N/A 02/02/2015   SLF: 1. 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 large hiatal hernia 3. mild non-erosive gastritis 4. No source for anemia identified.   [facility] LEFT HEART CATHETERIZATION WITH CORONARY ANGIOGRAM N/A 09/03/2013   Procedure: LEFT HEART CATHETERIZATION WITH CORONARY ANGIOGRAM;  Surgeon: Jettie Booze, MD;  Location: [facility] CATH LAB;  Service: Cardiovascular;  Laterality: N/A;  . Right carpal tunnel release      There were no vitals filed for this visit.   Subjective Assessment - 01/15/20 1755    Subjective Pt went to urgent care Saturday following bending forward and pulled muscle in lower back.  Reports exercises have been helping, able to turn head while turning.  Current pain scale 4/10 neck and back.  Saw Aline Brochure today and reports happy with progress, no further apts scheduled.    Pertinent History Neck pain which has been ongoing for many months    Patient Stated Goals Make the pain go away and avoid surgery    Currently in Pain? Yes    Pain Score 4  Pain Location Neck   Neck and LBP   Pain Orientation Left    Pain Descriptors / Indicators Nagging    Pain Type Chronic pain    Pain Radiating Towards Reports 2 weeks ago down to fingers    Pain Onset More than a month ago    Pain Frequency Intermittent    Aggravating Factors  Sleeping wrong, tension    Pain Relieving Factors muscle relaxor/Prednisone              [facility] PT Assessment - 01/15/20 0001      Assessment   Medical Diagnosis Neck pain; shoulder impingement    Referring Provider (PT) Arther Abbott, MD    Onset Date/Surgical Date --   Several months   Hand Dominance Right    Next MD Visit PRN    Prior  Therapy None      Observation/Other Assessments   Focus on Therapeutic Outcomes (FOTO)  67.67      AROM   Cervical Flexion 35    Cervical Extension 38    Cervical - Right Side Bend 30    Cervical - Left Side Bend 35    Cervical - Right Rotation 53    Cervical - Left Rotation 60                         [facility] Adult PT Treatment/Exercise - 01/15/20 0001      Exercises   Exercises Neck      Neck Exercises: Seated   Neck Retraction 20 reps    Other Seated Exercise 3D cervical excursion 10x ea pain free range    Other Seated Exercise GTB row and shoulder extension 2x 5      Manual Therapy   Manual Therapy Soft tissue mobilization    Manual therapy comments Manual complete separate than rest of tx    Soft tissue mobilization Seated to Lt UT, levator scapula      Neck Exercises: Stretches   Upper Trapezius Stretch Right;Left;3 reps;20 seconds                    PT Short Term Goals - 01/15/20 1800      PT SHORT TERM GOAL #1   Title Patient will report understanding and regular compliance with HEP to improve strength, mobility and decrease pain.    Baseline 01/15/20:  Reports complaince wiht HEP daily, reports increased mobility and ease with driving and pain reduced    Status Achieved             PT Long Term Goals - 01/15/20 1804      PT LONG TERM GOAL #1   Title Patient will demonstrate cervical AROM WFL for improved functional mobility.    Baseline 01/15/20:  see ROM    Status Partially Met      PT LONG TERM GOAL #2   Title Patient will report an improvement of at least 50% in overall subjective complaint for improved QOL.    Baseline 01/15/20:  Reports 45%, increased ability to turn head while driving    Status Partially Met      PT LONG TERM GOAL #3   Title Patient will demonstrate improvement on FOTO of at least 10% indicating improvement in overall functional mobility.    Baseline FOTO: 67.67% was 57%    Status Achieved                  Plan - 01/15/20 1837  Clinical Impression Statement Reviewed goals per out of cert with the following findings: 1/1 STGs met and 1/3 LTGs met.  Pt presents with compliance wiht HEP, improved cervical mobility and FOTO score increased by 10% indicating improved self perceived functional ability.  Pt reports she feels improvements by 45% and would like to continue therapy for a little longer for postural strengthening and mobility for pain control.  Added postural strenghtening with therabands in seated position due to LBP.  Pt given printout and theraband to progress at home, need to assess form and mechanics next session.  EOS with STM to address restricitons to improve cervical mobility and pain control. Patient would benefit from an additional 3 weeks of therapy to continue addressing deficits and progressing towards achieving goals.    Personal Factors and Comorbidities Age;Time since onset of injury/illness/exacerbation;Comorbidity 2    Comorbidities HTN, DM    Examination-Activity Limitations Lift;Sit;Dressing;Hygiene/Grooming    Examination-Participation Restrictions Cleaning;Meal Prep;Community Activity    Stability/Clinical Decision Making Stable/Uncomplicated    Clinical Decision Making Low    Rehab Potential Good    PT Frequency 2x / week    PT Duration 3 weeks    PT Treatment/Interventions ADLs/Self Care Home Management;Aquatic Therapy;Cryotherapy;Electrical Stimulation;Moist Heat;Functional mobility training;Therapeutic activities;Therapeutic exercise;Neuromuscular re-education;Patient/family education;Manual techniques;Passive range of motion;Dry needling;Energy conservation;Taping    PT Next Visit Plan Focus on gentle RTC strengthening, cervical mobility, and STM for pain reduction PRN to periscpular/ anterior cervical musculature    PT Home Exercise Plan 12/04/19: chin tuck; 01/03/20: decompression exercises and scapular retraction 01/07/20: upper trap stretch: 9/15: GTB row  and extension           Patient will benefit from skilled therapeutic intervention in order to improve the following deficits and impairments:  Improper body mechanics, Pain, Decreased mobility, Increased muscle spasms, Postural dysfunction, Decreased activity tolerance, Decreased endurance, Decreased range of motion, Decreased strength, Hypomobility  Visit Diagnosis: Cervicalgia     Problem List Patient Active Problem List   Diagnosis Date Noted  . Vitamin D deficiency 07/18/2019  . Empty sella syndrome ([place]) 10/24/2018  . Screening for colon cancer 01/07/2015  . Hemorrhoids 11/25/2014  . Obstructive sleep apnea 10/14/2013  . PAF (paroxysmal atrial fibrillation) ([place]) 09/06/2013  . Lumbar back pain 09/06/2013  . NSTEMI (non-ST elevated myocardial infarction) ([place]) 09/03/2013  . MDD (major depressive disorder), single episode 02/04/2013  . Anxiety state, unspecified 02/04/2013  . Hyperlipidemia associated with type 2 diabetes mellitus ([place]) 06/06/2012  . Carpal tunnel syndrome of left wrist 12/27/2011  . De Quervain's disease (tenosynovitis) 10/09/2011  . Heartburn 02/27/2011  . Allergic rhinitis 08/29/2010  . Anemia 02/15/2010  . Hyperlipidemia 12/23/2009  . Class 3 obesity 12/23/2009  . Chronic migraine 12/23/2009   Ihor Austin, LPTA/CLT; CBIS [phone]  Aldona Lento 01/15/2020, 6:46 PM  [facility] [address] [place], [place], [address] Phone: [phone]   Fax:  [phone]  Name: Monique Gomez MRN: [id number] Date of Birth: 1956/02/20

## 2020-01-16 ENCOUNTER — Encounter (HOSPITAL_COMMUNITY): Payer: BC Managed Care – PPO | Admitting: Physical Therapy

## 2020-01-16 NOTE — Addendum Note (Signed)
Addended by: Clarene Critchley on: 01/16/2020 08:53 AM   Modules accepted: Orders

## 2020-01-17 ENCOUNTER — Encounter (HOSPITAL_COMMUNITY): Payer: BC Managed Care – PPO | Admitting: Physical Therapy

## 2020-01-20 ENCOUNTER — Telehealth: Payer: Self-pay | Admitting: Neurology

## 2020-01-20 ENCOUNTER — Ambulatory Visit: Payer: Self-pay | Admitting: Neurology

## 2020-01-20 NOTE — Telephone Encounter (Signed)
..   Pt understands that although there may be some limitations with this type of visit, we will take all precautions to reduce any security or privacy concerns.  Pt understands that this will be treated like an in office visit and we will file with pt's insurance, and there may be a patient responsible charge related to this service. ? ?

## 2020-01-20 NOTE — Progress Notes (Deleted)
PATIENT: Monique Gomez DOB: 29-Aug-1955  REASON FOR VISIT: follow up HISTORY FROM: patient  HISTORY OF PRESENT ILLNESS: Today 01/20/20  HISTORY   Monique Gomez is a 64 year old female, seen in request by her primary care physician Dr. Buelah Manis, Lonell Grandchild F for evaluation of frequent headaches, initial evaluation through virtual visit on October 30, 2018.  I have reviewed and summarized the referring note from the referring physician. She had past medical history of hypertension, hyperlipidemia, diabetes since 2018, atrial fibrillation, taking Eliquis, denies history of stroke or coronary artery disease  She reported history of migraine headache when she was 20s and 30s, she had severe frequent headaches at that time, but much improved, over the past couple months, she complains of gradual worsening headaches, especially since June 2020, she had a persistent headache for 3 weeks, she complains of daily retro-orbital area, occipital area pressure headaches, movement made it worse, with associated light noise sound sensitivity,  She does complains of anxiety especially with COVID 19, she works as a Education officer, museum  She was started on Topamax 25 mg 2 tablets every night as preventive medication, which helps her, was given prescription of Imitrex 100 mg as needed, she tried few times, did not help her headache much, complains of worsening nausea  Previously tried Fioricet with limited help  She was seen by her primary care physician, had MRI and CT head, personally reviewed MRI of the brain on October 23, 2018, no acute abnormality, mild small vessel disease, partially empty sella CT of maxillary sinus, there was no evidence of acute abnormality  Laboratory evaluations in June 2020: BMP showed mild elevated glucose 121, CBC showed hemoglobin of 12.7, WBC of 11.4, normal TSH, A1c of 6.6,  UPDATE November 22 2018: She was started on Effexor, could not tolerated due to side effect, was  later switched to Topamax, 25 mg titrating to 2 tablets every night, also add on Aimovig 70 mg every month, she reported moderate improvement of her headaches, Maxalt along with Zofran works for her headaches, but caused drowsiness, she has to use Imitrex injection 2-3 times each week for rescue therapy, which works for her migraine, she office for IV infusion for abortive treatment, which works well for her,  Update February 25, 2019 SS: She presents today for follow-up for migraine headaches.  She is taking Aimovig 70 mg monthly injection, Topamax 50 mg at bedtime.  She indicates her migraines are under excellent control.  She has not had a migraine in 3 months.  She recently decreased her dose of Topamax due to shortness of breath.  She is tolerating the lower dose of Topamax.  She does have chronic sinus issues, well managed with Mucinex, Tylenol, Flonase.  She has not had to use her rescue medications in 3 months.  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 She has AFIB.   Update January 20, 2020 SS: Coming in today, to discuss daily headaches, currently taking Aimovig 70 mg monthly injection, Topamax 50 mg at bedtime, Imitrex injection as needed.  Also on atenolol, Eliquis, history A. fib.  Was recently in the ER for back pain.    REVIEW OF SYSTEMS: Out of a complete 14 system review of symptoms, the patient complains only of the following symptoms, and all other reviewed systems are negative.  ALLERGIES: Allergies  Allergen Reactions   Venlafaxine Nausea And Vomiting and Other (See Comments)    Dizziness, shakiness    HOME MEDICATIONS: Outpatient Medications  Prior to Visit  Medication Sig Dispense Refill   acetaminophen (TYLENOL) 500 MG tablet Take 1 tablet (500 mg total) by mouth every 6 (six) hours as needed. 30 tablet 0   AIMOVIG 70 MG/ML SOAJ Inject 70 mg into the skin every 30 (thirty) days. 1 pen 11   apixaban (ELIQUIS) 5 MG TABS tablet Take 1 tablet (5 mg  total) by mouth 2 (two) times daily. 180 tablet 3   atenolol (TENORMIN) 50 MG tablet Take 1 tablet by mouth once daily 90 tablet 0   atorvastatin (LIPITOR) 80 MG tablet Take 1 tablet (80 mg total) by mouth daily. 90 tablet 3   cyclobenzaprine (FLEXERIL) 5 MG tablet Take 1 tablet (5 mg total) by mouth 3 (three) times daily as needed. 30 tablet 0   fluticasone (FLONASE) 50 MCG/ACT nasal spray Place 2 sprays into both nostrils daily.     ipratropium (ATROVENT) 0.03 % nasal spray Place 2 sprays into both nostrils every 12 (twelve) hours. 30 mL 3   Iron-FA-B Cmp-C-Biot-Probiotic (FUSION PLUS) CAPS Take 2 tablets by mouth daily. 60 capsule 3   lisinopril (ZESTRIL) 40 MG tablet Take 1 tablet (40 mg total) by mouth daily. 90 tablet 3   metFORMIN (GLUCOPHAGE) 500 MG tablet Take 1 tablet (500 mg total) by mouth 2 (two) times daily with a meal. 180 tablet 3   metoCLOPramide (REGLAN) 10 MG tablet Take 1 tablet (10 mg total) by mouth every 8 (eight) hours as needed for up to 7 days for nausea (Headache). 21 tablet 0   ondansetron (ZOFRAN ODT) 4 MG disintegrating tablet Take 1 tablet (4 mg total) by mouth every 8 (eight) hours as needed. 20 tablet 6   pantoprazole (PROTONIX) 40 MG tablet Take 1 tablet by mouth twice daily 180 tablet 0   predniSONE (DELTASONE) 10 MG tablet Take 2 tablets (20 mg total) by mouth daily. 15 tablet 0   Semaglutide 7 MG TABS Take 7 mg by mouth daily before breakfast. 30 tablet 2   SUMAtriptan (IMITREX) 6 MG/0.5ML SOLN injection Inject 0.5 mLs (6 mg total) into the skin every 2 (two) hours as needed for migraine or headache. May repeat in 2 hours if headache persists or recurs. 6 mL 6   tiZANidine (ZANAFLEX) 4 MG tablet Take 1 tablet (4 mg total) by mouth every 6 (six) hours as needed for muscle spasms. 30 tablet 0   topiramate (TOPAMAX) 50 MG tablet Take 1 tablet (50 mg total) by mouth at bedtime. Please call [phone] to schedule appt for continued refills. 90 tablet 0    No facility-administered medications prior to visit.    PAST MEDICAL HISTORY: Past Medical History:  Diagnosis Date   Abdominal fibromatosis    Allergic rhinitis    Anemia    Asthma    Essential hypertension    GERD (gastroesophageal reflux disease)    Headache(784.0)    History of cardiac catheterization    No significant CAD May 2015   History of migraine headaches    Hyperlipidemia    Migraines    Obesity    PAF (paroxysmal atrial fibrillation) ([place]) 08/2013   Sleep apnea    Type 2 diabetes mellitus ([place])     PAST SURGICAL HISTORY: Past Surgical History:  Procedure Laterality Date   CATARACT EXTRACTION     CATARACT EXTRACTION W/PHACO Right 04/05/2019   Procedure: CATARACT EXTRACTION PHACO AND INTRAOCULAR LENS PLACEMENT RIGHT EYE (CDE: 3.19);  Surgeon: Baruch Goldmann, MD;  Location: [facility];  Service:  Ophthalmology;  Laterality: Right;   COLONOSCOPY N/A 02/02/2015   SLF: 1. one colon polyp removed-no source for anemia identified. 2. moderate diverticulosis noted in the sigmoid colon and descending colon 3. the left colon is redundant 4. Rectal bleeding due ot small internal hemorroids 5. Moderate sized external hemorrhoids.   ESOPHAGOGASTRODUODENOSCOPY N/A 02/02/2015   SLF: 1. 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 large hiatal hernia 3. mild non-erosive gastritis 4. No source for anemia identified.    LEFT HEART CATHETERIZATION WITH CORONARY ANGIOGRAM N/A 09/03/2013   Procedure: LEFT HEART CATHETERIZATION WITH CORONARY ANGIOGRAM;  Surgeon: Jettie Booze, MD;  Location: [facility] CATH LAB;  Service: Cardiovascular;  Laterality: N/A;   Right carpal tunnel release      FAMILY HISTORY: Family History  Problem Relation Age of Onset   Diabetes Mother    Hypertension Mother    Heart disease Mother    High Cholesterol Mother    Alzheimer's disease Father    Hypertension Sister    Hypertension Brother    Diabetes Brother    Arthritis  Sister    Hypertension Sister    High Cholesterol Sister    Diabetes Son    Developmental delay Son    Colon cancer Neg Hx     SOCIAL HISTORY: Social History   Socioeconomic History   Marital status: Single    Spouse name: Not on file   Number of children: 2   Years of education: Not on file   Highest education level: Not on file  Occupational History   Occupation: Education officer, museum    Employer: [employer]  Tobacco Use   Smoking status: Former Smoker    Packs/day: 0.30    Types: Cigarettes    Quit date: 04/01/2012    Years since quitting: 7.8   Smokeless tobacco: Never Used  Vaping Use   Vaping Use: Never used  Substance and Sexual Activity   Alcohol use: Yes    Alcohol/week: 0.0 standard drinks    Comment: very occasional   Drug use: No   Sexual activity: Yes  Other Topics Concern   Not on file  Social History Narrative   Not on file   Social Determinants of Health   Financial Resource Strain:    Difficulty of Paying Living Expenses: Not on file  Food Insecurity:    Worried About [place] in the Last Year: Not on file   Ran Out of Food in the Last Year: Not on file  Transportation Needs:    Lack of Transportation (Medical): Not on file   Lack of Transportation (Non-Medical): Not on file  Physical Activity:    Days of Exercise per Week: Not on file   Minutes of Exercise per Session: Not on file  Stress:    Feeling of Stress : Not on file  Social Connections:    Frequency of Communication with Friends and Family: Not on file   Frequency of Social Gatherings with Friends and Family: Not on file   Attends Religious Services: Not on file   Active Member of Clubs or Organizations: Not on file   Attends Archivist Meetings: Not on file   Marital Status: Not on file  Intimate Partner Violence:    Fear of Current or Ex-Partner: Not on file   Emotionally Abused: Not on file   Physically Abused: Not on  file   Sexually Abused: Not on file      PHYSICAL EXAM  There were no  vitals filed for this visit. There is no height or weight on file to calculate BMI.  Generalized: Well developed, in no acute distress   Neurological examination  Mentation: Alert oriented to time, place, history taking. Follows all commands speech and language fluent Cranial nerve II-XII: Pupils were equal round reactive to light. Extraocular movements were full, visual field were full on confrontational test. Facial sensation and strength were normal. Uvula tongue midline. Head turning and shoulder shrug  were normal and symmetric. Motor: The motor testing reveals 5 over 5 strength of all 4 extremities. Good symmetric motor tone is noted throughout.  Sensory: Sensory testing is intact to soft touch on all 4 extremities. No evidence of extinction is noted.  Coordination: Cerebellar testing reveals good finger-nose-finger and heel-to-shin bilaterally.  Gait and station: Gait is normal. Tandem gait is normal. Romberg is negative. No drift is seen.  Reflexes: Deep tendon reflexes are symmetric and normal bilaterally.   DIAGNOSTIC DATA (LABS, IMAGING, TESTING) - I reviewed patient records, labs, notes, testing and imaging myself where available.  Lab Results  Component Value Date   WBC 9.2 12/18/2019   HGB 10.9 (L) 12/18/2019   HCT 36.9 12/18/2019   MCV 72.2 (L) 12/18/2019   PLT 310 12/18/2019      Component Value Date/Time   NA 142 12/18/2019 0822   K 4.4 12/18/2019 0822   CL 109 12/18/2019 0822   CO2 25 12/18/2019 0822   GLUCOSE 110 (H) 12/18/2019 0822   BUN 21 12/18/2019 0822   CREATININE 0.84 12/18/2019 0822   CALCIUM 9.6 12/18/2019 0822   PROT 6.6 12/18/2019 0822   ALBUMIN 3.9 02/12/2019 1838   AST 12 12/18/2019 0822   ALT 10 12/18/2019 0822   ALKPHOS 77 02/12/2019 1838   BILITOT 0.2 12/18/2019 0822   GFRNONAA >60 10/15/2019 0850   GFRNONAA 83 10/23/2017 0811   GFRAA >60 10/15/2019 0850    GFRAA 97 10/23/2017 0811   Lab Results  Component Value Date   CHOL 165 12/18/2019   HDL 42 (L) 12/18/2019   LDLCALC 107 (H) 12/18/2019   TRIG 74 12/18/2019   CHOLHDL 3.9 12/18/2019   Lab Results  Component Value Date   HGBA1C 6.4 (H) 12/18/2019   Lab Results  Component Value Date   VITAMINB12 428 07/17/2019   Lab Results  Component Value Date   TSH 2.210 07/17/2019      ASSESSMENT AND PLAN 64 y.o. year old female  has a past medical history of Abdominal fibromatosis, Allergic rhinitis, Anemia, Asthma, Essential hypertension, GERD (gastroesophageal reflux disease), Headache(784.0), History of cardiac catheterization, History of migraine headaches, Hyperlipidemia, Migraines, Obesity, PAF (paroxysmal atrial fibrillation) ([place]) (08/2013), Sleep apnea, and Type 2 diabetes mellitus ([place]). here with ***   I spent 15 minutes with the patient. 50% of this time was spent   Butler Denmark, [place],  01/20/2020, 5:33 AM [facility] [address], [place], [place] [address] [phone]

## 2020-01-21 ENCOUNTER — Telehealth: Payer: Self-pay | Admitting: Adult Health

## 2020-01-21 ENCOUNTER — Encounter (HOSPITAL_COMMUNITY): Payer: BC Managed Care – PPO | Admitting: Physical Therapy

## 2020-01-21 ENCOUNTER — Telehealth (INDEPENDENT_AMBULATORY_CARE_PROVIDER_SITE_OTHER): Payer: BC Managed Care – PPO | Admitting: Adult Health

## 2020-01-21 DIAGNOSIS — G43719 Chronic migraine without aura, intractable, without status migrainosus: Secondary | ICD-10-CM

## 2020-01-21 MED ORDER — NURTEC 75 MG PO TBDP: ORAL_TABLET | ORAL | 5 refills | Status: DC

## 2020-01-21 MED ORDER — TOPIRAMATE 50 MG PO TABS: 75.0000 mg | ORAL_TABLET | Freq: Every day | ORAL | 3 refills | Status: DC

## 2020-01-21 NOTE — Progress Notes (Signed)
PATIENT: Monique Gomez DOB: Oct 02, 1955  REASON FOR VISIT: follow up HISTORY FROM: patient  Virtual Visit via Video Note  I connected with Monique Gomez on 01/21/20 at 10:30 AM EDT by a video enabled telemedicine application located remotely at [facility] and verified that I am speaking with the correct person using two identifiers who was located at their own home.   I discussed the limitations of evaluation and management by telemedicine and the availability of in person appointments. The patient expressed understanding and agreed to proceed.   PATIENT: Monique Gomez DOB: 08/15/1955  REASON FOR VISIT: follow up HISTORY FROM: patient  HISTORY OF PRESENT ILLNESS: Today 01/21/20 :  Ms. [name] is a 64 year old female with a history of migraine headaches.  She returns today for follow-up.  She is currently taking 70 mg of Aimovig, Topamax 50 mg daily.  She uses sumatriptan for her migraine headaches.  She reports that starting several months back she began to have daily headaches.  She describes it as a pressure sensation associated with some vertigo as well.  She did go see her ENT as she thought it was sinus related.  She reports that her exam with them was relatively unremarkable.  They did give her some nasal spray to use however they felt that her symptoms was related to her headaches.  The patient states that the location of her headache varies.  With some headache she does have photophobia.  Reports that she has been taking Tylenol daily until she spoke to the nurse at our office last week.  She states that she has tried to decrease her usage since then.  Reports that she does use sumatriptan on occasion and reports that it does not always offer her good benefit.  She returns today for an evaluation.  She has tried sumatriptan, Maxalt, diclofenac and Fioricet as abortive therapies  HISTORY February 25, 2019 SS: She presents today for follow-up for  migraine headaches.  She is taking Aimovig 70 mg monthly injection, Topamax 50 mg at bedtime.  She indicates her migraines are under excellent control.  She has not had a migraine in 3 months.  She recently decreased her dose of Topamax due to shortness of breath.  She is tolerating the lower dose of Topamax.  She does have chronic sinus issues, well managed with Mucinex, Tylenol, Flonase.  She has not had to use her rescue medications in 3 months.  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 She has AFIB.   REVIEW OF SYSTEMS: Out of a complete 14 system review of symptoms, the patient complains only of the following symptoms, and all other reviewed systems are negative.  See HPI  ALLERGIES: Allergies  Allergen Reactions  . Venlafaxine Nausea And Vomiting and Other (See Comments)    Dizziness, shakiness    HOME MEDICATIONS: Outpatient Medications Prior to Visit  Medication Sig Dispense Refill  . acetaminophen (TYLENOL) 500 MG tablet Take 1 tablet (500 mg total) by mouth every 6 (six) hours as needed. 30 tablet 0  . AIMOVIG 70 MG/ML SOAJ Inject 70 mg into the skin every 30 (thirty) days. 1 pen 11  . apixaban (ELIQUIS) 5 MG TABS tablet Take 1 tablet (5 mg total) by mouth 2 (two) times daily. 180 tablet 3  . atenolol (TENORMIN) 50 MG tablet Take 1 tablet by mouth once daily 90 tablet 0  . atorvastatin (LIPITOR) 80 MG tablet Take 1 tablet (80 mg total)  by mouth daily. 90 tablet 3  . cyclobenzaprine (FLEXERIL) 5 MG tablet Take 1 tablet (5 mg total) by mouth 3 (three) times daily as needed. 30 tablet 0  . fluticasone (FLONASE) 50 MCG/ACT nasal spray Place 2 sprays into both nostrils daily.    Marland Kitchen ipratropium (ATROVENT) 0.03 % nasal spray Place 2 sprays into both nostrils every 12 (twelve) hours. 30 mL 3  . Iron-FA-B Cmp-C-Biot-Probiotic (FUSION PLUS) CAPS Take 2 tablets by mouth daily. 60 capsule 3  . lisinopril (ZESTRIL) 40 MG tablet Take 1 tablet (40 mg total) by mouth daily. 90  tablet 3  . metFORMIN (GLUCOPHAGE) 500 MG tablet Take 1 tablet (500 mg total) by mouth 2 (two) times daily with a meal. 180 tablet 3  . metoCLOPramide (REGLAN) 10 MG tablet Take 1 tablet (10 mg total) by mouth every 8 (eight) hours as needed for up to 7 days for nausea (Headache). 21 tablet 0  . ondansetron (ZOFRAN ODT) 4 MG disintegrating tablet Take 1 tablet (4 mg total) by mouth every 8 (eight) hours as needed. 20 tablet 6  . pantoprazole (PROTONIX) 40 MG tablet Take 1 tablet by mouth twice daily 180 tablet 0  . predniSONE (DELTASONE) 10 MG tablet Take 2 tablets (20 mg total) by mouth daily. 15 tablet 0  . Semaglutide 7 MG TABS Take 7 mg by mouth daily before breakfast. 30 tablet 2  . SUMAtriptan (IMITREX) 6 MG/0.5ML SOLN injection Inject 0.5 mLs (6 mg total) into the skin every 2 (two) hours as needed for migraine or headache. May repeat in 2 hours if headache persists or recurs. 6 mL 6  . tiZANidine (ZANAFLEX) 4 MG tablet Take 1 tablet (4 mg total) by mouth every 6 (six) hours as needed for muscle spasms. 30 tablet 0  . topiramate (TOPAMAX) 50 MG tablet Take 1 tablet (50 mg total) by mouth at bedtime. Please call [phone] to schedule appt for continued refills. 90 tablet 0   No facility-administered medications prior to visit.    PAST MEDICAL HISTORY: Past Medical History:  Diagnosis Date  . Abdominal fibromatosis   . Allergic rhinitis   . Anemia   . Asthma   . Essential hypertension   . GERD (gastroesophageal reflux disease)   . Headache(784.0)   . History of cardiac catheterization    No significant CAD May 2015  . History of migraine headaches   . Hyperlipidemia   . Migraines   . Obesity   . PAF (paroxysmal atrial fibrillation) ([place]) 08/2013  . Sleep apnea   . Type 2 diabetes mellitus ([place])     PAST SURGICAL HISTORY: Past Surgical History:  Procedure Laterality Date  . CATARACT EXTRACTION    . CATARACT EXTRACTION W/PHACO Right 04/05/2019   Procedure: CATARACT EXTRACTION  PHACO AND INTRAOCULAR LENS PLACEMENT RIGHT EYE (CDE: 3.19);  Surgeon: Baruch Goldmann, MD;  Location: [facility];  Service: Ophthalmology;  Laterality: Right;  . COLONOSCOPY N/A 02/02/2015   SLF: 1. one colon polyp removed-no source for anemia identified. 2. moderate diverticulosis noted in the sigmoid colon and descending colon 3. the left colon is redundant 4. Rectal bleeding due ot small internal hemorroids 5. Moderate sized external hemorrhoids.  . ESOPHAGOGASTRODUODENOSCOPY N/A 02/02/2015   SLF: 1. 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 large hiatal hernia 3. mild non-erosive gastritis 4. No source for anemia identified.   Marland Kitchen LEFT HEART CATHETERIZATION WITH CORONARY ANGIOGRAM N/A 09/03/2013   Procedure: LEFT HEART CATHETERIZATION WITH CORONARY ANGIOGRAM;  Surgeon: Charlann Lange  Irish Lack, MD;  Location: [facility] CATH LAB;  Service: Cardiovascular;  Laterality: N/A;  . Right carpal tunnel release      FAMILY HISTORY: Family History  Problem Relation Age of Onset  . Diabetes Mother   . Hypertension Mother   . Heart disease Mother   . High Cholesterol Mother   . Alzheimer's disease Father   . Hypertension Sister   . Hypertension Brother   . Diabetes Brother   . Arthritis Sister   . Hypertension Sister   . High Cholesterol Sister   . Diabetes Son   . Developmental delay Son   . Colon cancer Neg Hx     SOCIAL HISTORY: Social History   Socioeconomic History  . Marital status: Single    Spouse name: Not on file  . Number of children: 2  . Years of education: Not on file  . Highest education level: Not on file  Occupational History  . Occupation: Training and development officer: [employer]  Tobacco Use  . Smoking status: Former Smoker    Packs/day: 0.30    Types: Cigarettes    Quit date: 04/01/2012    Years since quitting: 7.8  . Smokeless tobacco: Never Used  Vaping Use  . Vaping Use: Never used  Substance and Sexual Activity  . Alcohol use: Yes    Alcohol/week: 0.0  standard drinks    Comment: very occasional  . Drug use: No  . Sexual activity: Yes  Other Topics Concern  . Not on file  Social History Narrative  . Not on file   Social Determinants of Health   Financial Resource Strain:   . Difficulty of Paying Living Expenses: Not on file  Food Insecurity:   . Worried About Charity fundraiser in the Last Year: Not on file  . Ran Out of Food in the Last Year: Not on file  Transportation Needs:   . Lack of Transportation (Medical): Not on file  . Lack of Transportation (Non-Medical): Not on file  Physical Activity:   . Days of Exercise per Week: Not on file  . Minutes of Exercise per Session: Not on file  Stress:   . Feeling of Stress : Not on file  Social Connections:   . Frequency of Communication with Friends and Family: Not on file  . Frequency of Social Gatherings with Friends and Family: Not on file  . Attends Religious Services: Not on file  . Active Member of Clubs or Organizations: Not on file  . Attends Archivist Meetings: Not on file  . Marital Status: Not on file  Intimate Partner Violence:   . Fear of Current or Ex-Partner: Not on file  . Emotionally Abused: Not on file  . Physically Abused: Not on file  . Sexually Abused: Not on file      PHYSICAL EXAM Generalized: Well developed, in no acute distress   Neurological examination  Mentation: Alert oriented to time, place, history taking. Follows all commands speech and language fluent Cranial nerve II-XII:Extraocular movements were full. Facial symmetry noted. uvula tongue midline. Head turning and shoulder shrug  were normal and symmetric. Motor: Good strength throughout subjectively per patient Sensory: Sensory testing is intact to soft touch on all 4 extremities subjectively per patient Coordination: Cerebellar testing reveals good finger-nose-finger  Gait and station: Patient is able to stand from a seated position. gait is normal.  Reflexes:  UTA  DIAGNOSTIC DATA (LABS, IMAGING, TESTING) - I reviewed patient records, labs,  notes, testing and imaging myself where available.  Lab Results  Component Value Date   WBC 9.2 12/18/2019   HGB 10.9 (L) 12/18/2019   HCT 36.9 12/18/2019   MCV 72.2 (L) 12/18/2019   PLT 310 12/18/2019      Component Value Date/Time   NA 142 12/18/2019 0822   K 4.4 12/18/2019 0822   CL 109 12/18/2019 0822   CO2 25 12/18/2019 0822   GLUCOSE 110 (H) 12/18/2019 0822   BUN 21 12/18/2019 0822   CREATININE 0.84 12/18/2019 0822   CALCIUM 9.6 12/18/2019 0822   PROT 6.6 12/18/2019 0822   ALBUMIN 3.9 02/12/2019 1838   AST 12 12/18/2019 0822   ALT 10 12/18/2019 0822   ALKPHOS 77 02/12/2019 1838   BILITOT 0.2 12/18/2019 0822   GFRNONAA >60 10/15/2019 0850   GFRNONAA 83 10/23/2017 0811   GFRAA >60 10/15/2019 0850   GFRAA 97 10/23/2017 0811   Lab Results  Component Value Date   CHOL 165 12/18/2019   HDL 42 (L) 12/18/2019   LDLCALC 107 (H) 12/18/2019   TRIG 74 12/18/2019   CHOLHDL 3.9 12/18/2019   Lab Results  Component Value Date   HGBA1C 6.4 (H) 12/18/2019   Lab Results  Component Value Date   VITAMINB12 428 07/17/2019   Lab Results  Component Value Date   TSH 2.210 07/17/2019      ASSESSMENT AND PLAN 64 y.o. year old female  has a past medical history of Abdominal fibromatosis, Allergic rhinitis, Anemia, Asthma, Essential hypertension, GERD (gastroesophageal reflux disease), Headache(784.0), History of cardiac catheterization, History of migraine headaches, Hyperlipidemia, Migraines, Obesity, PAF (paroxysmal atrial fibrillation) ([place]) (08/2013), Sleep apnea, and Type 2 diabetes mellitus ([place]). here with:  Migraine headaches   Continue Aimovig 70 mg monthly  Increase Topamax to 75 mg nightly  Start Nurtec 75 mg to use at the onset of a migraine as abortive therapy.  Advised that she can only take 1 tablet in 24 hours  Advised patient to stop using any over-the-counter medication  such as Tylenol.  If headache frequency does not improve she should let us know she will follow-up in 6 months with Judson Roch   I spent 30 minutes of face-to-face and non-face-to-face time with patient.  This included previsit chart review, lab review, study review, order entry, electronic health record documentation, patient education.    Ward Givens, MSN, NP-C 01/21/2020, 10:43 AM [facility] [address], [place], [place] [address] [phone]

## 2020-01-21 NOTE — Telephone Encounter (Signed)
Received PA request for Nurtec from [email or web address]. Key is [id number]. Information has been submitted. Per [employer], determination will be received within 72 hours. Will check back later for a response.

## 2020-01-21 NOTE — Patient Instructions (Signed)
Continue Aimovig 70 mg monthly Increase Topamax to 75 mg nightly Stop using any over-the-counter medications such as Tylenol Start Nurtec as an abortive therapy.  Take 1 tablet at the onset of a migraine.  Only 1 tablet in 24 hours.

## 2020-01-22 NOTE — Telephone Encounter (Signed)
Received form from [facility] asking additional questions in regards to the Nurtec PA. Form has been completed. Will fax back to [facility] tomorrow once the fax machines are working again.

## 2020-01-23 ENCOUNTER — Encounter (HOSPITAL_COMMUNITY): Payer: BC Managed Care – PPO | Admitting: Physical Therapy

## 2020-01-25 ENCOUNTER — Other Ambulatory Visit: Payer: Self-pay | Admitting: Family Medicine

## 2020-01-26 ENCOUNTER — Other Ambulatory Visit: Payer: Self-pay | Admitting: Family Medicine

## 2020-01-26 DIAGNOSIS — K219 Gastro-esophageal reflux disease without esophagitis: Secondary | ICD-10-CM

## 2020-01-27 MED ORDER — UBRELVY 50 MG PO TABS: ORAL_TABLET | ORAL | 5 refills | Status: DC

## 2020-01-27 NOTE — Telephone Encounter (Signed)
Pt has called and the message from [place] was read to her.  Pt will wait for a call back from [place] to discuss if she will be able to get her medication once she gets her savings card

## 2020-01-27 NOTE — Telephone Encounter (Signed)
Completed PA for Ubrelvy. PA was completed on [email or web address]. Key is [id number]. PA has been approved. Will await fax from insurance. Called and spoke with patient. She is aware of the approval. I advised her to call us back if she has any issues with getting her medication.

## 2020-01-27 NOTE — Telephone Encounter (Signed)
Spoke with patient. She confirmed that she does have Pharmacist, community. Offered to call [employer] with the savings card info to see if they could process it for her, she agreed to this.    Called [facility] in [place] and spoke with Tamika. I provided her with the savings card information. She stated that the savings card did not work because the savings plan is wanting the insurance to pay a certain amount.   Spoke with patient again. She stated that she is willing to try the [name] to see if insurance will pay.   Jinny Blossom, would you be ok with switching her to [place]?

## 2020-01-27 NOTE — Telephone Encounter (Signed)
PA for Nurtec has been denied. Nurtec will be approved once the patient has tried and failed [place].   Will call patient to see if she has access to Nurtec savings card since she has Pharmacist, community.

## 2020-01-27 NOTE — Telephone Encounter (Signed)
Orders placed.

## 2020-01-27 NOTE — Addendum Note (Signed)
Addended by: Trudie Buckler on: 01/27/2020 02:31 PM   Modules accepted: Orders

## 2020-01-29 ENCOUNTER — Telehealth (HOSPITAL_COMMUNITY): Payer: Self-pay

## 2020-01-29 ENCOUNTER — Ambulatory Visit (HOSPITAL_COMMUNITY): Payer: BC Managed Care – PPO

## 2020-01-29 NOTE — Telephone Encounter (Signed)
Pt will have to cx today due to work schedule

## 2020-02-03 ENCOUNTER — Ambulatory Visit (HOSPITAL_COMMUNITY): Payer: BC Managed Care – PPO

## 2020-02-04 ENCOUNTER — Encounter (HOSPITAL_COMMUNITY): Payer: Self-pay

## 2020-02-04 ENCOUNTER — Ambulatory Visit (HOSPITAL_COMMUNITY): Payer: BC Managed Care – PPO | Attending: Orthopedic Surgery

## 2020-02-04 ENCOUNTER — Other Ambulatory Visit: Payer: Self-pay

## 2020-02-04 ENCOUNTER — Other Ambulatory Visit: Payer: Self-pay | Admitting: Family Medicine

## 2020-02-04 DIAGNOSIS — M542 Cervicalgia: Secondary | ICD-10-CM | POA: Diagnosis not present

## 2020-02-04 NOTE — Therapy (Addendum)
[facility] [address] [place], [place], [address] Phone: [phone]   Fax:  [phone]  Physical Therapy Treatment / Discharge Summary  Patient Details  Name: Monique Gomez MRN: [id number] Date of Birth: 1955/06/16 Referring Provider (PT): Arther Abbott, MD   Encounter Date: 69/10/2950   As a licensed physical therapist I have read and agree with the following note.  Clarene Critchley PT, DPT 10:48 AM, 02/05/20 [phone]   PHYSICAL THERAPY DISCHARGE SUMMARY  Visits from Start of Care: 5  Current functional level related to goals / functional outcomes: See below   Remaining deficits: See below   Education / Equipment: HEP Plan: Patient agrees to discharge.  Patient goals were met. Patient is being discharged due to meeting the stated rehab goals.  ?????        PT End of Session - 02/04/20 1721    Visit Number 5    Number of Visits 12    Date for PT Re-Evaluation 02/05/20    Authorization Type BCBS (No auth required; 30 visit limit)    Authorization - Visit Number 5    Authorization - Number of Visits 30    Progress Note Due on Visit 10    PT Start Time 1702    PT Stop Time 1717    PT Time Calculation (min) 15 min    Activity Tolerance Patient tolerated treatment well;No increased pain    Behavior During Therapy WFL for tasks assessed/performed           Past Medical History:  Diagnosis Date  . Abdominal fibromatosis   . Allergic rhinitis   . Anemia   . Asthma   . Essential hypertension   . GERD (gastroesophageal reflux disease)   . Headache(784.0)   . History of cardiac catheterization    No significant CAD May 2015  . History of migraine headaches   . Hyperlipidemia   . Migraines   . Obesity   . PAF (paroxysmal atrial fibrillation) ([place]) 08/2013  . Sleep apnea   . Type 2 diabetes mellitus ([place])     Past Surgical History:  Procedure Laterality Date  . CATARACT EXTRACTION    . CATARACT  EXTRACTION W/PHACO Right 04/05/2019   Procedure: CATARACT EXTRACTION PHACO AND INTRAOCULAR LENS PLACEMENT RIGHT EYE (CDE: 3.19);  Surgeon: Baruch Goldmann, MD;  Location: [facility];  Service: Ophthalmology;  Laterality: Right;  . COLONOSCOPY N/A 02/02/2015   SLF: 1. one colon polyp removed-no source for anemia identified. 2. moderate diverticulosis noted in the sigmoid colon and descending colon 3. the left colon is redundant 4. Rectal bleeding due ot small internal hemorroids 5. Moderate sized external hemorrhoids.  . ESOPHAGOGASTRODUODENOSCOPY N/A 02/02/2015   SLF: 1. 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 large hiatal hernia 3. mild non-erosive gastritis 4. No source for anemia identified.   Marland Kitchen LEFT HEART CATHETERIZATION WITH CORONARY ANGIOGRAM N/A 09/03/2013   Procedure: LEFT HEART CATHETERIZATION WITH CORONARY ANGIOGRAM;  Surgeon: Jettie Booze, MD;  Location: [facility] CATH LAB;  Service: Cardiovascular;  Laterality: N/A;  . Right carpal tunnel release      There were no vitals filed for this visit.   Subjective Assessment - 02/04/20 1705    Subjective Pt stated she had a family loss and is sad at entrance.  Reports she has some soreness in her neck, no real pain today.   Has been compliant wiht HEP.    Pertinent History Neck pain which has been ongoing for many  months    Patient Stated Goals Make the pain go away and avoid surgery    Currently in Pain? Yes    Pain Score 0-No pain    Pain Location Neck    Pain Descriptors / Indicators Sore              [facility] PT Assessment - 02/04/20 0001      Assessment   Medical Diagnosis Neck pain; shoulder impingement    Referring Provider (PT) Arther Abbott, MD    Onset Date/Surgical Date --   several months   Hand Dominance Right    Next MD Visit 6 months from September 2021    Prior Therapy none      Observation/Other Assessments   Focus on Therapeutic Outcomes (FOTO)  67.67   on 9/15 was 57.4     AROM   Cervical Flexion 37    was 35   Cervical Extension 45   was 38   Cervical - Right Side Bend 38   was 30   Cervical - Left Side Bend 38   was 35   Cervical - Right Rotation 60   was 53   Cervical - Left Rotation 60                         [facility] Adult PT Treatment/Exercise - 02/04/20 0001      Exercises   Exercises Neck (P)                     PT Short Term Goals - 01/15/20 1800      PT SHORT TERM GOAL #1   Title Patient will report understanding and regular compliance with HEP to improve strength, mobility and decrease pain.    Baseline 01/15/20:  Reports complaince wiht HEP daily, reports increased mobility and ease with driving and pain reduced    Status Achieved             PT Long Term Goals - 02/04/20 1707      PT LONG TERM GOAL #1   Title Patient will demonstrate cervical AROM WFL for improved functional mobility.    Baseline 02/04/20: see ROM measurements    Status Achieved      PT LONG TERM GOAL #2   Title Patient will report an improvement of at least 50% in overall subjective complaint for improved QOL.    Baseline 02/04/20:  Reports 75-80% improvements, able to turn head while driving.  4/76/54:  Reports 45%, increased ability to turn head while driving    Status Achieved      PT LONG TERM GOAL #3   Title Patient will demonstrate improvement on FOTO of at least 10% indicating improvement in overall functional mobility.    Baseline FOTO: 67.67% was 57%    Status Achieved                 Plan - 02/04/20 1715    Clinical Impression Statement Pt has not attended therapy since last reassessment in September.  Reports she has been compliant wiht HEP and feels she has improved 75-80%.  Reviewed goals wiht AROM WNL and equal in range.  Discussed exercises to continue following DC, verbalized understanding.    Personal Factors and Comorbidities Age;Time since onset of injury/illness/exacerbation;Comorbidity 2    Comorbidities HTN, DM     Examination-Activity Limitations Lift;Sit;Dressing;Hygiene/Grooming    Examination-Participation Restrictions Cleaning;Meal Prep;Community Activity    Stability/Clinical Decision Making Stable/Uncomplicated  Clinical Decision Making Low    Rehab Potential Good    PT Frequency 2x / week    PT Duration 3 weeks    PT Treatment/Interventions ADLs/Self Care Home Management;Aquatic Therapy;Cryotherapy;Electrical Stimulation;Moist Heat;Functional mobility training;Therapeutic activities;Therapeutic exercise;Neuromuscular re-education;Patient/family education;Manual techniques;Passive range of motion;Dry needling;Energy conservation;Taping    PT Next Visit Plan DC to HEP.    PT Home Exercise Plan 12/04/19: chin tuck; 01/03/20: decompression exercises and scapular retraction 01/07/20: upper trap stretch: 9/15: GTB row and extension           Patient will benefit from skilled therapeutic intervention in order to improve the following deficits and impairments:  Improper body mechanics, Pain, Decreased mobility, Increased muscle spasms, Postural dysfunction, Decreased activity tolerance, Decreased endurance, Decreased range of motion, Decreased strength, Hypomobility  Visit Diagnosis: Cervicalgia     Problem List Patient Active Problem List   Diagnosis Date Noted  . Vitamin D deficiency 07/18/2019  . Empty sella syndrome ([place]) 10/24/2018  . Screening for colon cancer 01/07/2015  . Hemorrhoids 11/25/2014  . Obstructive sleep apnea 10/14/2013  . PAF (paroxysmal atrial fibrillation) ([place]) 09/06/2013  . Lumbar back pain 09/06/2013  . NSTEMI (non-ST elevated myocardial infarction) ([place]) 09/03/2013  . MDD (major depressive disorder), single episode 02/04/2013  . Anxiety state, unspecified 02/04/2013  . Hyperlipidemia associated with type 2 diabetes mellitus ([place]) 06/06/2012  . Carpal tunnel syndrome of left wrist 12/27/2011  . De Quervain's disease (tenosynovitis) 10/09/2011  . Heartburn 02/27/2011    . Allergic rhinitis 08/29/2010  . Anemia 02/15/2010  . Hyperlipidemia 12/23/2009  . Class 3 obesity 12/23/2009  . Chronic migraine 12/23/2009   Ihor Austin, LPTA/CLT; CBIS [phone]  Aldona Lento 02/04/2020, 5:25 PM  [place], [place], [address] Phone: [phone]   Fax:  [phone]  Name: Monique Gomez MRN: [id number] Date of Birth: [birth year]

## 2020-02-06 ENCOUNTER — Ambulatory Visit (HOSPITAL_COMMUNITY): Payer: BC Managed Care – PPO

## 2020-02-07 ENCOUNTER — Ambulatory Visit (HOSPITAL_COMMUNITY): Payer: BC Managed Care – PPO

## 2020-02-07 ENCOUNTER — Encounter (HOSPITAL_COMMUNITY): Payer: Self-pay

## 2020-02-11 ENCOUNTER — Ambulatory Visit (HOSPITAL_COMMUNITY): Payer: BC Managed Care – PPO

## 2020-02-13 ENCOUNTER — Encounter (HOSPITAL_COMMUNITY): Payer: BC Managed Care – PPO

## 2020-02-20 ENCOUNTER — Other Ambulatory Visit: Payer: Self-pay | Admitting: Family Medicine

## 2020-02-24 ENCOUNTER — Telehealth: Payer: Self-pay | Admitting: Neurology

## 2020-02-24 ENCOUNTER — Other Ambulatory Visit: Payer: Self-pay | Admitting: Family Medicine

## 2020-02-24 NOTE — Telephone Encounter (Signed)
Received a PA renewal for Aimovig 70mg . PA was started on [email or web address]. Key is [id number]. Per [employer], a determination will be made within the next 72 hours. Will check back later for a response.

## 2020-02-27 NOTE — Telephone Encounter (Signed)
Received fax from insurance stating Aimovig has been approved. Has been approved 02/24/20 to 02/22/21. Will fax a copy of the determination to patient's pharmacy.

## 2020-03-04 ENCOUNTER — Other Ambulatory Visit: Payer: Self-pay

## 2020-03-04 ENCOUNTER — Ambulatory Visit (HOSPITAL_COMMUNITY)
Admission: RE | Admit: 2020-03-04 | Discharge: 2020-03-04 | Disposition: A | Discharge status: Home / Self Care | Payer: BC Managed Care – PPO | Source: Ambulatory Visit | Attending: Family Medicine | Admitting: Family Medicine

## 2020-03-04 DIAGNOSIS — Z1231 Encounter for screening mammogram for malignant neoplasm of breast: Secondary | ICD-10-CM | POA: Insufficient documentation

## 2020-03-16 ENCOUNTER — Other Ambulatory Visit: Payer: Self-pay

## 2020-03-16 ENCOUNTER — Ambulatory Visit (HOSPITAL_COMMUNITY)
Admission: RE | Admit: 2020-03-16 | Discharge: 2020-03-16 | Disposition: A | Discharge status: Home / Self Care | Payer: BC Managed Care – PPO | Source: Ambulatory Visit | Attending: Otolaryngology | Admitting: Otolaryngology

## 2020-03-16 DIAGNOSIS — J321 Chronic frontal sinusitis: Secondary | ICD-10-CM | POA: Diagnosis not present

## 2020-03-16 DIAGNOSIS — J329 Chronic sinusitis, unspecified: Secondary | ICD-10-CM | POA: Diagnosis not present

## 2020-03-18 ENCOUNTER — Telehealth: Payer: Self-pay | Admitting: Orthopedic Surgery

## 2020-03-18 ENCOUNTER — Other Ambulatory Visit: Payer: Self-pay | Admitting: Orthopedic Surgery

## 2020-03-18 MED ORDER — TRAMADOL-ACETAMINOPHEN 37.5-325 MG PO TABS: 1.0000 | ORAL_TABLET | Freq: Four times a day (QID) | ORAL | 0 refills | Status: AC | PRN

## 2020-03-18 NOTE — Telephone Encounter (Signed)
Yes   Make sure she is taking muscle relaxer as well

## 2020-03-18 NOTE — Progress Notes (Signed)
Meds ordered this encounter  Medications  . traMADol-acetaminophen (ULTRACET) 37.5-325 MG tablet    Sig: Take 1 tablet by mouth every 6 (six) hours as needed for up to 7 days for severe pain.    Dispense:  28 tablet    Refill:  0

## 2020-03-18 NOTE — Telephone Encounter (Signed)
Left message for her to call me back so I can see if she is using either the Tizanidine or the Cyclobenzoprene.

## 2020-03-18 NOTE — Telephone Encounter (Signed)
Patient left shoulder is hurting really bad and she doesn't have an appt till 04/15/20.  She wants to know if you can call in something for her, tylenol is not helping.  Pharmacy [facility].

## 2020-03-19 ENCOUNTER — Other Ambulatory Visit: Payer: Self-pay | Admitting: Orthopedic Surgery

## 2020-03-19 DIAGNOSIS — M542 Cervicalgia: Secondary | ICD-10-CM

## 2020-03-19 MED ORDER — CYCLOBENZAPRINE HCL 5 MG PO TABS: 5.0000 mg | ORAL_TABLET | Freq: Three times a day (TID) | ORAL | 0 refills | Status: DC | PRN

## 2020-03-19 NOTE — Telephone Encounter (Signed)
Not taking muscle relaxer, do you want to send one ?

## 2020-03-19 NOTE — Progress Notes (Signed)
Meds ordered this encounter  Medications  . cyclobenzaprine (FLEXERIL) 5 MG tablet    Sig: Take 1 tablet (5 mg total) by mouth 3 (three) times daily as needed.    Dispense:  30 tablet    Refill:  0

## 2020-03-19 NOTE — Telephone Encounter (Signed)
Yes sent in a rx for Musc Relax

## 2020-03-19 NOTE — Telephone Encounter (Signed)
Patient called 8:16am this morning to try again to reach Amy. Per Amy's note, asked patient if she is taking the muscle relaxer, and she states she is not taking any muscle relaxer. Aware Dr Aline Brochure had sent in something for her. Please further advise as she said she'd still like to speak with Amy.

## 2020-03-19 NOTE — Telephone Encounter (Signed)
Thanks. I called her, we discussed

## 2020-03-31 ENCOUNTER — Other Ambulatory Visit: Payer: Self-pay | Admitting: Neurology

## 2020-04-13 ENCOUNTER — Telehealth: Payer: Self-pay | Admitting: Adult Health

## 2020-04-13 NOTE — Telephone Encounter (Signed)
Received a PA renewal for [employer]. PA was approved 04/09/20 to 04/08/21. Will fax a copy of the determination to patient's pharmacy.

## 2020-04-15 ENCOUNTER — Ambulatory Visit: Payer: BC Managed Care – PPO | Admitting: Family Medicine

## 2020-04-15 ENCOUNTER — Ambulatory Visit (INDEPENDENT_AMBULATORY_CARE_PROVIDER_SITE_OTHER): Payer: BC Managed Care – PPO | Admitting: Orthopedic Surgery

## 2020-04-15 ENCOUNTER — Other Ambulatory Visit: Payer: Self-pay

## 2020-04-15 ENCOUNTER — Encounter: Payer: Self-pay | Admitting: Orthopedic Surgery

## 2020-04-15 VITALS — BP 170/92 | HR 78 | Ht 62.0 in | Wt 270.0 lb

## 2020-04-15 DIAGNOSIS — M7542 Impingement syndrome of left shoulder: Secondary | ICD-10-CM | POA: Diagnosis not present

## 2020-04-15 NOTE — Patient Instructions (Signed)
Continue tizanidine and tramadol as needed

## 2020-04-15 NOTE — Progress Notes (Signed)
Chief Complaint  Patient presents with  . Shoulder Pain    Better, Tramadol and Tizanidine helped a lot    Encounter Diagnosis  Name Primary?  . Shoulder impingement, left Yes    Monique Gomez is on tizanidine and tramadol on an as-needed basis she says she really got relief by taking 1 of each and her pain essentially resolved she can now move her arm and shoulder without any problems  She does not want a refill on any other medication  Her exam shows she has full range of motion no instability no tenderness normal strength in the left shoulder  She is released to see on an as-needed basis she can take the tramadol of tizanidine as needed

## 2020-04-16 ENCOUNTER — Ambulatory Visit (INDEPENDENT_AMBULATORY_CARE_PROVIDER_SITE_OTHER): Payer: BC Managed Care – PPO | Admitting: Nurse Practitioner

## 2020-04-16 ENCOUNTER — Other Ambulatory Visit: Payer: Self-pay | Admitting: Family Medicine

## 2020-04-16 VITALS — BP 132/72 | HR 50 | Temp 98.7°F | Ht 62.0 in | Wt 280.0 lb

## 2020-04-16 DIAGNOSIS — R300 Dysuria: Secondary | ICD-10-CM | POA: Diagnosis not present

## 2020-04-16 DIAGNOSIS — N898 Other specified noninflammatory disorders of vagina: Secondary | ICD-10-CM | POA: Diagnosis not present

## 2020-04-16 LAB — URINALYSIS, ROUTINE W REFLEX MICROSCOPIC
Bilirubin Urine: NEGATIVE
Glucose, UA: NEGATIVE
Hgb urine dipstick: NEGATIVE
Ketones, ur: NEGATIVE
Leukocytes,Ua: NEGATIVE
Nitrite: NEGATIVE
Protein, ur: NEGATIVE
Specific Gravity, Urine: 1.02 (ref 1.001–1.03)
pH: 6 (ref 5.0–8.0)

## 2020-04-16 LAB — WET PREP FOR TRICH, YEAST, CLUE

## 2020-04-16 NOTE — Progress Notes (Signed)
Subjective:    Patient ID: Monique Gomez, female    DOB: 06/08/1955, 64 y.o.   MRN: [id number]  HPI: Monique Gomez is a 64 y.o. female presenting for urinary tract infection symptoms.  Chief Complaint  Patient presents with  . Dysuria    Having pressure in back and when urinating for 1 wk, says she has not had a uti in 30 yrs   URINARY SYMPTOMS Duration: ~2 weeks Dysuria: no Urinary frequency: yes Urgency: no Small volume voids: yes Symptom severity: mild Urinary incontinence: no Foul odor: no Hematuria: no Abdominal pain: no Back pain: yes Suprapubic pain/pressure: yes Flank pain: no Fever:  no  Nausea: no Vomiting: no Relief with cranberry juice: yes Relief with pyridium: has not tried Status: stable Previous urinary tract infection: yes; 30 years ago Recurrent urinary tract infection: no Sexual activity: No sexually active History of sexually transmitted disease: no Vaginal discharge: yes; clear thick when she wipes at times Treatments attempted: cranberry juice   Allergies  Allergen Reactions  . Venlafaxine Nausea And Vomiting and Other (See Comments)    Dizziness, shakiness    Outpatient Encounter Medications as of 04/16/2020  Medication Sig  . acetaminophen (TYLENOL) 500 MG tablet Take 1 tablet (500 mg total) by mouth every 6 (six) hours as needed.  Marland Kitchen AIMOVIG 70 MG/ML SOAJ INJECT 70 MG INTO THE SKIN EVERY 30 DAYS  . atenolol (TENORMIN) 50 MG tablet Take 1 tablet by mouth once daily  . atorvastatin (LIPITOR) 80 MG tablet Take 1 tablet by mouth once daily  . cyclobenzaprine (FLEXERIL) 5 MG tablet Take 1 tablet (5 mg total) by mouth 3 (three) times daily as needed.  Marland Kitchen ELIQUIS 5 MG TABS tablet Take 1 tablet by mouth twice daily  . fluticasone (FLONASE) 50 MCG/ACT nasal spray Place 2 sprays into both nostrils daily.  Marland Kitchen ipratropium (ATROVENT) 0.03 % nasal spray Place 2 sprays into both nostrils every 12 (twelve) hours.  . Iron-FA-B  Cmp-C-Biot-Probiotic (FUSION PLUS) CAPS Take 2 tablets by mouth daily.  Marland Kitchen lisinopril (ZESTRIL) 40 MG tablet Take 1 tablet (40 mg total) by mouth daily.  . metFORMIN (GLUCOPHAGE) 500 MG tablet Take 1 tablet (500 mg total) by mouth 2 (two) times daily with a meal.  . metoCLOPramide (REGLAN) 10 MG tablet Take 1 tablet (10 mg total) by mouth every 8 (eight) hours as needed for up to 7 days for nausea (Headache).  . ondansetron (ZOFRAN ODT) 4 MG disintegrating tablet Take 1 tablet (4 mg total) by mouth every 8 (eight) hours as needed.  . pantoprazole (PROTONIX) 40 MG tablet Take 1 tablet by mouth twice daily  . predniSONE (DELTASONE) 10 MG tablet Take 2 tablets (20 mg total) by mouth daily.  . Semaglutide 7 MG TABS Take 7 mg by mouth daily before breakfast.  . SUMAtriptan (IMITREX) 6 MG/0.5ML SOLN injection Inject 0.5 mLs (6 mg total) into the skin every 2 (two) hours as needed for migraine or headache. May repeat in 2 hours if headache persists or recurs.  Marland Kitchen tiZANidine (ZANAFLEX) 4 MG tablet Take 1 tablet (4 mg total) by mouth every 6 (six) hours as needed for muscle spasms.  Marland Kitchen topiramate (TOPAMAX) 50 MG tablet Take 1.5 tablets (75 mg total) by mouth at bedtime.  . traMADol (ULTRAM) 50 MG tablet Take by mouth every 6 (six) hours as needed.  Marland Kitchen Ubrogepant (UBRELVY) 50 MG TABS Take 1 tablet at the onset of a migraine.  Can repeat in 2  hours if needed.   No facility-administered encounter medications on file as of 04/16/2020.    Patient Active Problem List   Diagnosis Date Noted  . Vitamin D deficiency 07/18/2019  . Empty sella syndrome ([place]) 10/24/2018  . Screening for colon cancer 01/07/2015  . Hemorrhoids 11/25/2014  . Obstructive sleep apnea 10/14/2013  . PAF (paroxysmal atrial fibrillation) ([place]) 09/06/2013  . Lumbar back pain 09/06/2013  . NSTEMI (non-ST elevated myocardial infarction) ([place]) 09/03/2013  . MDD (major depressive disorder), single episode 02/04/2013  . Anxiety state,  unspecified 02/04/2013  . Hyperlipidemia associated with type 2 diabetes mellitus ([place]) 06/06/2012  . Carpal tunnel syndrome of left wrist 12/27/2011  . De Quervain's disease (tenosynovitis) 10/09/2011  . Heartburn 02/27/2011  . Allergic rhinitis 08/29/2010  . Anemia 02/15/2010  . Hyperlipidemia 12/23/2009  . Class 3 obesity 12/23/2009  . Chronic migraine 12/23/2009    Past Medical History:  Diagnosis Date  . Abdominal fibromatosis   . Allergic rhinitis   . Anemia   . Asthma   . Essential hypertension   . GERD (gastroesophageal reflux disease)   . Headache(784.0)   . History of cardiac catheterization    No significant CAD May 2015  . History of migraine headaches   . Hyperlipidemia   . Migraines   . Obesity   . PAF (paroxysmal atrial fibrillation) ([place]) 08/2013  . Sleep apnea   . Type 2 diabetes mellitus (HCC)     Relevant past medical, surgical, family and social history reviewed and updated as indicated. Interim medical history since our last visit reviewed.  Review of Systems  Constitutional: Negative.  Negative for activity change, appetite change, fatigue and fever.  Gastrointestinal:       + Suprapubic pain  Genitourinary: Positive for decreased urine volume, frequency and vaginal discharge. Negative for difficulty urinating, dyspareunia, dysuria, flank pain, hematuria, pelvic pain, urgency, vaginal bleeding and vaginal pain.       Pressure with urinating  Musculoskeletal: Positive for back pain (midline).  Skin: Negative.  Negative for rash.  Neurological: Negative.   Psychiatric/Behavioral: Negative.     Per HPI unless specifically indicated above     Objective:    BP 132/72   Pulse (!) 50   Temp 98.7 F (37.1 C)   Ht 5\' 2"  (1.575 m)   Wt 280 lb (127 kg)   SpO2 99%   BMI 51.21 kg/m   Wt Readings from Last 3 Encounters:  04/16/20 280 lb (127 kg)  04/15/20 270 lb (122.5 kg)  01/15/20 270 lb (122.5 kg)    Physical Exam Vitals and nursing note  reviewed.  Constitutional:      General: She is not in acute distress.    Appearance: Normal appearance. She is not toxic-appearing.  HENT:     Head: Normocephalic and atraumatic.  Abdominal:     General: Abdomen is flat. Bowel sounds are normal. There is no distension.     Palpations: Abdomen is soft.     Tenderness: There is abdominal tenderness (suprapubic area). There is no right CVA tenderness or left CVA tenderness.  Skin:    General: Skin is warm and dry.     Capillary Refill: Capillary refill takes less than 2 seconds.     Coloration: Skin is not jaundiced or pale.     Findings: No erythema.  Neurological:     Mental Status: She is alert and oriented to person, place, and time.     Motor: No weakness.  Gait: Gait normal.  Psychiatric:        Mood and Affect: Mood normal.        Behavior: Behavior normal.        Thought Content: Thought content normal.        Judgment: Judgment normal.       Assessment & Plan:   Problem List Items Addressed This Visit   None   Visit Diagnoses    Dysuria    -  Primary   Urine dipstick negative for abnormalities today.  Will obtain wet prep.  In meantime, drink plenty of water. Discussed return and ER precautions.   Relevant Orders   Urinalysis, Routine w reflex microscopic (Completed)   Urine Culture   Vaginal discharge       Acute, ongoing.  will obtain wet prep.  Discussed vaginal hygiene and not to use perfumes/fragerences.    Relevant Orders   WET PREP FOR [place], YEAST, CLUE       Follow up plan: Return if symptoms worsen or fail to improve.

## 2020-04-16 NOTE — Patient Instructions (Signed)

## 2020-04-17 LAB — URINE CULTURE
MICRO NUMBER:: 11326534
SPECIMEN QUALITY:: ADEQUATE

## 2020-05-04 ENCOUNTER — Other Ambulatory Visit: Payer: Self-pay | Admitting: Neurology

## 2020-05-13 ENCOUNTER — Emergency Department (HOSPITAL_COMMUNITY): Payer: BC Managed Care – PPO

## 2020-05-13 ENCOUNTER — Inpatient Hospital Stay (HOSPITAL_COMMUNITY)
Admission: EM | Admit: 2020-05-13 | Discharge: 2020-05-19 | DRG: 286 | Disposition: A | Discharge status: Home / Self Care | Payer: BC Managed Care – PPO | Attending: Interventional Cardiology | Admitting: Interventional Cardiology

## 2020-05-13 ENCOUNTER — Encounter (HOSPITAL_COMMUNITY): Payer: Self-pay

## 2020-05-13 ENCOUNTER — Encounter (HOSPITAL_COMMUNITY)
Admission: EM | Disposition: A | Discharge status: Home / Self Care | Payer: Self-pay | Source: Home / Self Care | Attending: Interventional Cardiology

## 2020-05-13 ENCOUNTER — Other Ambulatory Visit: Payer: Self-pay

## 2020-05-13 DIAGNOSIS — I214 Non-ST elevation (NSTEMI) myocardial infarction: Secondary | ICD-10-CM | POA: Diagnosis present

## 2020-05-13 DIAGNOSIS — G4733 Obstructive sleep apnea (adult) (pediatric): Secondary | ICD-10-CM | POA: Diagnosis not present

## 2020-05-13 DIAGNOSIS — J45909 Unspecified asthma, uncomplicated: Secondary | ICD-10-CM | POA: Diagnosis present

## 2020-05-13 DIAGNOSIS — I400 Infective myocarditis: Principal | ICD-10-CM | POA: Diagnosis present

## 2020-05-13 DIAGNOSIS — Z23 Encounter for immunization: Secondary | ICD-10-CM

## 2020-05-13 DIAGNOSIS — E785 Hyperlipidemia, unspecified: Secondary | ICD-10-CM | POA: Diagnosis present

## 2020-05-13 DIAGNOSIS — I249 Acute ischemic heart disease, unspecified: Secondary | ICD-10-CM | POA: Diagnosis not present

## 2020-05-13 DIAGNOSIS — Z888 Allergy status to other drugs, medicaments and biological substances status: Secondary | ICD-10-CM

## 2020-05-13 DIAGNOSIS — I5041 Acute combined systolic (congestive) and diastolic (congestive) heart failure: Secondary | ICD-10-CM | POA: Diagnosis not present

## 2020-05-13 DIAGNOSIS — D72829 Elevated white blood cell count, unspecified: Secondary | ICD-10-CM | POA: Diagnosis present

## 2020-05-13 DIAGNOSIS — I4891 Unspecified atrial fibrillation: Secondary | ICD-10-CM

## 2020-05-13 DIAGNOSIS — R0789 Other chest pain: Secondary | ICD-10-CM | POA: Diagnosis not present

## 2020-05-13 DIAGNOSIS — I248 Other forms of acute ischemic heart disease: Secondary | ICD-10-CM | POA: Diagnosis present

## 2020-05-13 DIAGNOSIS — E669 Obesity, unspecified: Secondary | ICD-10-CM | POA: Diagnosis not present

## 2020-05-13 DIAGNOSIS — I110 Hypertensive heart disease with heart failure: Secondary | ICD-10-CM | POA: Diagnosis present

## 2020-05-13 DIAGNOSIS — Z87891 Personal history of nicotine dependence: Secondary | ICD-10-CM

## 2020-05-13 DIAGNOSIS — I480 Paroxysmal atrial fibrillation: Secondary | ICD-10-CM | POA: Diagnosis not present

## 2020-05-13 DIAGNOSIS — E8770 Fluid overload, unspecified: Secondary | ICD-10-CM | POA: Diagnosis not present

## 2020-05-13 DIAGNOSIS — I959 Hypotension, unspecified: Secondary | ICD-10-CM | POA: Diagnosis present

## 2020-05-13 DIAGNOSIS — D509 Iron deficiency anemia, unspecified: Secondary | ICD-10-CM | POA: Diagnosis not present

## 2020-05-13 DIAGNOSIS — E876 Hypokalemia: Secondary | ICD-10-CM | POA: Diagnosis present

## 2020-05-13 DIAGNOSIS — K219 Gastro-esophageal reflux disease without esophagitis: Secondary | ICD-10-CM | POA: Diagnosis present

## 2020-05-13 DIAGNOSIS — I219 Acute myocardial infarction, unspecified: Secondary | ICD-10-CM

## 2020-05-13 DIAGNOSIS — R5383 Other fatigue: Secondary | ICD-10-CM | POA: Diagnosis not present

## 2020-05-13 DIAGNOSIS — Z820 Family history of epilepsy and other diseases of the nervous system: Secondary | ICD-10-CM

## 2020-05-13 DIAGNOSIS — R509 Fever, unspecified: Secondary | ICD-10-CM | POA: Diagnosis not present

## 2020-05-13 DIAGNOSIS — I313 Pericardial effusion (noninflammatory): Secondary | ICD-10-CM

## 2020-05-13 DIAGNOSIS — I9589 Other hypotension: Secondary | ICD-10-CM

## 2020-05-13 DIAGNOSIS — Z833 Family history of diabetes mellitus: Secondary | ICD-10-CM

## 2020-05-13 DIAGNOSIS — I4510 Unspecified right bundle-branch block: Secondary | ICD-10-CM | POA: Diagnosis not present

## 2020-05-13 DIAGNOSIS — Z83438 Family history of other disorder of lipoprotein metabolism and other lipidemia: Secondary | ICD-10-CM

## 2020-05-13 DIAGNOSIS — N179 Acute kidney failure, unspecified: Secondary | ICD-10-CM | POA: Diagnosis present

## 2020-05-13 DIAGNOSIS — D649 Anemia, unspecified: Secondary | ICD-10-CM | POA: Diagnosis present

## 2020-05-13 DIAGNOSIS — E119 Type 2 diabetes mellitus without complications: Secondary | ICD-10-CM | POA: Diagnosis present

## 2020-05-13 DIAGNOSIS — Z6841 Body Mass Index (BMI) 40.0 and over, adult: Secondary | ICD-10-CM | POA: Diagnosis not present

## 2020-05-13 DIAGNOSIS — I401 Isolated myocarditis: Secondary | ICD-10-CM | POA: Diagnosis not present

## 2020-05-13 DIAGNOSIS — I514 Myocarditis, unspecified: Secondary | ICD-10-CM

## 2020-05-13 DIAGNOSIS — Z7901 Long term (current) use of anticoagulants: Secondary | ICD-10-CM

## 2020-05-13 DIAGNOSIS — R7982 Elevated C-reactive protein (CRP): Secondary | ICD-10-CM | POA: Diagnosis present

## 2020-05-13 DIAGNOSIS — R0600 Dyspnea, unspecified: Secondary | ICD-10-CM | POA: Diagnosis not present

## 2020-05-13 DIAGNOSIS — J9811 Atelectasis: Secondary | ICD-10-CM | POA: Diagnosis not present

## 2020-05-13 DIAGNOSIS — I408 Other acute myocarditis: Secondary | ICD-10-CM | POA: Diagnosis not present

## 2020-05-13 DIAGNOSIS — I409 Acute myocarditis, unspecified: Secondary | ICD-10-CM | POA: Diagnosis not present

## 2020-05-13 DIAGNOSIS — Z20822 Contact with and (suspected) exposure to covid-19: Secondary | ICD-10-CM | POA: Diagnosis present

## 2020-05-13 DIAGNOSIS — Z8261 Family history of arthritis: Secondary | ICD-10-CM

## 2020-05-13 DIAGNOSIS — Z8249 Family history of ischemic heart disease and other diseases of the circulatory system: Secondary | ICD-10-CM

## 2020-05-13 LAB — CBC
HCT: 34.4 % — ABNORMAL LOW (ref 36.0–46.0)
Hemoglobin: 10.3 g/dL — ABNORMAL LOW (ref 12.0–15.0)
MCH: 19.6 pg — ABNORMAL LOW (ref 26.0–34.0)
MCHC: 29.9 g/dL — ABNORMAL LOW (ref 30.0–36.0)
MCV: 65.5 fL — ABNORMAL LOW (ref 80.0–100.0)
Platelets: 312 10*3/uL (ref 150–400)
RBC: 5.25 MIL/uL — ABNORMAL HIGH (ref 3.87–5.11)
RDW: 19.3 % — ABNORMAL HIGH (ref 11.5–15.5)
WBC: 14.5 10*3/uL — ABNORMAL HIGH (ref 4.0–10.5)
nRBC: 0 % (ref 0.0–0.2)

## 2020-05-13 LAB — URINALYSIS, ROUTINE W REFLEX MICROSCOPIC
Bacteria, UA: NONE SEEN
Bilirubin Urine: NEGATIVE
Glucose, UA: NEGATIVE mg/dL
Hgb urine dipstick: NEGATIVE
Ketones, ur: NEGATIVE mg/dL
Leukocytes,Ua: NEGATIVE
Nitrite: NEGATIVE
Protein, ur: 30 mg/dL — AB
Specific Gravity, Urine: >1.046 — ABNORMAL HIGH (ref 1.005–1.030)
pH: 5 (ref 5.0–8.0)

## 2020-05-13 LAB — APTT
aPTT: 32 seconds (ref 24–36)
aPTT: 42 seconds — ABNORMAL HIGH (ref 24–36)

## 2020-05-13 LAB — PROTIME-INR
INR: 1.4 — ABNORMAL HIGH (ref 0.8–1.2)
Prothrombin Time: 16.3 seconds — ABNORMAL HIGH (ref 11.4–15.2)

## 2020-05-13 LAB — MRSA PCR SCREENING: MRSA by PCR: NEGATIVE

## 2020-05-13 LAB — COMPREHENSIVE METABOLIC PANEL
ALT: 41 U/L (ref 0–44)
AST: 166 U/L — ABNORMAL HIGH (ref 15–41)
Albumin: 3.4 g/dL — ABNORMAL LOW (ref 3.5–5.0)
Alkaline Phosphatase: 59 U/L (ref 38–126)
Anion gap: 13 (ref 5–15)
BUN: 24 mg/dL — ABNORMAL HIGH (ref 8–23)
CO2: 23 mmol/L (ref 22–32)
Calcium: 9 mg/dL (ref 8.9–10.3)
Chloride: 98 mmol/L (ref 98–111)
Creatinine, Ser: 1.39 mg/dL — ABNORMAL HIGH (ref 0.44–1.00)
GFR, Estimated: 43 mL/min — ABNORMAL LOW (ref >60)
Glucose, Bld: 143 mg/dL — ABNORMAL HIGH (ref 70–99)
Potassium: 3 mmol/L — ABNORMAL LOW (ref 3.5–5.1)
Sodium: 134 mmol/L — ABNORMAL LOW (ref 135–145)
Total Bilirubin: 0.6 mg/dL (ref 0.3–1.2)
Total Protein: 7.9 g/dL (ref 6.5–8.1)

## 2020-05-13 LAB — TSH: TSH: 3.035 u[IU]/mL (ref 0.350–4.500)

## 2020-05-13 LAB — ECHOCARDIOGRAM COMPLETE
AV Mean grad: 6.6 mmHg
AV Peak grad: 12.5 mmHg
Ao pk vel: 1.76 m/s
Area-P 1/2: 7.82 cm2
Height: 62 in
S' Lateral: 1.97 cm
Weight: 4400 oz

## 2020-05-13 LAB — GLUCOSE, CAPILLARY
Glucose-Capillary: 104 mg/dL — ABNORMAL HIGH (ref 70–99)
Glucose-Capillary: 100 mg/dL — ABNORMAL HIGH (ref 70–99)

## 2020-05-13 LAB — HEMOGLOBIN A1C
Hgb A1c MFr Bld: 6.4 % — ABNORMAL HIGH (ref 4.8–5.6)
Hgb A1c MFr Bld: 6.4 % — ABNORMAL HIGH (ref 4.8–5.6)
Mean Plasma Glucose: 136.98 mg/dL
Mean Plasma Glucose: 136.98 mg/dL

## 2020-05-13 LAB — LIPID PANEL
Cholesterol: 162 mg/dL (ref 0–200)
HDL: 24 mg/dL — ABNORMAL LOW (ref >40)
LDL Cholesterol: 107 mg/dL — ABNORMAL HIGH (ref 0–99)
Total CHOL/HDL Ratio: 6.8 RATIO
Triglycerides: 156 mg/dL — ABNORMAL HIGH (ref <150)
VLDL: 31 mg/dL (ref 0–40)

## 2020-05-13 LAB — TROPONIN I (HIGH SENSITIVITY)
Troponin I (High Sensitivity): >27000 ng/L (ref <18)
Troponin I (High Sensitivity): >27000 ng/L (ref <18)

## 2020-05-13 LAB — HIV ANTIBODY (ROUTINE TESTING W REFLEX): HIV Screen 4th Generation wRfx: NONREACTIVE

## 2020-05-13 LAB — SEDIMENTATION RATE: Sed Rate: 103 mm/hr — ABNORMAL HIGH (ref 0–22)

## 2020-05-13 LAB — LIPASE, BLOOD: Lipase: 17 U/L (ref 11–51)

## 2020-05-13 LAB — RESP PANEL BY RT-PCR (FLU A&B, COVID) ARPGX2
Influenza A by PCR: NEGATIVE
Influenza B by PCR: NEGATIVE
SARS Coronavirus 2 by RT PCR: NEGATIVE

## 2020-05-13 LAB — PROCALCITONIN: Procalcitonin: 0.33 ng/mL

## 2020-05-13 LAB — HEPARIN LEVEL (UNFRACTIONATED): Heparin Unfractionated: >2.2 IU/mL — ABNORMAL HIGH (ref 0.30–0.70)

## 2020-05-13 LAB — C-REACTIVE PROTEIN: CRP: 8.1 mg/dL — ABNORMAL HIGH (ref <1.0)

## 2020-05-13 LAB — LACTIC ACID, PLASMA: Lactic Acid, Venous: 1.9 mmol/L (ref 0.5–1.9)

## 2020-05-13 LAB — BRAIN NATRIURETIC PEPTIDE: B Natriuretic Peptide: 1175 pg/mL — ABNORMAL HIGH (ref 0.0–100.0)

## 2020-05-13 SURGERY — LEFT HEART CATH AND CORONARY ANGIOGRAPHY
Anesthesia: LOCAL

## 2020-05-13 MED ORDER — COLCHICINE 0.6 MG PO TABS
0.6000 mg | ORAL_TABLET | Freq: Every day | ORAL | Status: DC
  Administered 2020-05-14 – 2020-05-19 (×6): 0.6 mg via ORAL
  Filled 2020-05-13 (×6): qty 1

## 2020-05-13 MED ORDER — HEPARIN (PORCINE) 25000 UT/250ML-% IV SOLN: 16.0000 [IU]/kg/h | INTRAVENOUS | Status: DC

## 2020-05-13 MED ORDER — HEPARIN SODIUM (PORCINE) 5000 UNIT/ML IJ SOLN
4000.0000 [IU] | Freq: Once | INTRAMUSCULAR | Status: AC
  Administered 2020-05-13: 4000 [IU] via INTRAVENOUS
  Filled 2020-05-13: qty 1

## 2020-05-13 MED ORDER — IOHEXOL 350 MG/ML SOLN
80.0000 mL | Freq: Once | INTRAVENOUS | Status: AC | PRN
  Administered 2020-05-13: 80 mL via INTRAVENOUS

## 2020-05-13 MED ORDER — POTASSIUM CHLORIDE 20 MEQ PO PACK
40.0000 meq | PACK | Freq: Once | ORAL | Status: AC
  Administered 2020-05-13: 40 meq via ORAL
  Filled 2020-05-13: qty 2

## 2020-05-13 MED ORDER — ASPIRIN 81 MG PO CHEW
324.0000 mg | CHEWABLE_TABLET | Freq: Once | ORAL | Status: AC
  Administered 2020-05-13: 324 mg via ORAL
  Filled 2020-05-13: qty 4

## 2020-05-13 MED ORDER — FLUTICASONE PROPIONATE 50 MCG/ACT NA SUSP
2.0000 | Freq: Every day | NASAL | Status: DC
  Filled 2020-05-13: qty 16

## 2020-05-13 MED ORDER — IPRATROPIUM BROMIDE 0.06 % NA SOLN
2.0000 | Freq: Two times a day (BID) | NASAL | Status: DC
  Filled 2020-05-13: qty 15
  Filled 2020-05-13: qty 30

## 2020-05-13 MED ORDER — SODIUM CHLORIDE 0.9 % IV SOLN: INTRAVENOUS | Status: DC

## 2020-05-13 MED ORDER — NITROGLYCERIN 0.4 MG SL SUBL: 0.4000 mg | SUBLINGUAL_TABLET | SUBLINGUAL | Status: DC | PRN

## 2020-05-13 MED ORDER — PANTOPRAZOLE SODIUM 40 MG PO TBEC
40.0000 mg | DELAYED_RELEASE_TABLET | Freq: Two times a day (BID) | ORAL | Status: DC
  Administered 2020-05-13 – 2020-05-19 (×12): 40 mg via ORAL
  Filled 2020-05-13 (×12): qty 1

## 2020-05-13 MED ORDER — SODIUM CHLORIDE 0.9 % WEIGHT BASED INFUSION
1.0000 mL/kg/h | INTRAVENOUS | Status: DC
Start: 2020-05-14 — End: 2020-05-14
  Administered 2020-05-14 (×2): 1 mL/kg/h via INTRAVENOUS

## 2020-05-13 MED ORDER — SODIUM CHLORIDE 0.9% FLUSH: 3.0000 mL | INTRAVENOUS | Status: DC | PRN

## 2020-05-13 MED ORDER — SODIUM CHLORIDE 0.9 % WEIGHT BASED INFUSION
3.0000 mL/kg/h | INTRAVENOUS | Status: DC
Start: 2020-05-14 — End: 2020-05-14
  Administered 2020-05-14: 3 mL/kg/h via INTRAVENOUS

## 2020-05-13 MED ORDER — HEPARIN (PORCINE) 25000 UT/250ML-% IV SOLN
1400.0000 [IU]/h | INTRAVENOUS | Status: DC
  Administered 2020-05-13: 11:00:00 1000 [IU]/h via INTRAVENOUS
  Filled 2020-05-13 (×2): qty 250

## 2020-05-13 MED ORDER — SODIUM CHLORIDE 0.9% FLUSH
3.0000 mL | Freq: Two times a day (BID) | INTRAVENOUS | Status: DC
  Administered 2020-05-13 – 2020-05-15 (×3): 3 mL via INTRAVENOUS

## 2020-05-13 MED ORDER — SODIUM CHLORIDE 0.9 % IV SOLN: INTRAVENOUS | Status: AC

## 2020-05-13 MED ORDER — POTASSIUM CHLORIDE CRYS ER 20 MEQ PO TBCR
40.0000 meq | EXTENDED_RELEASE_TABLET | Freq: Once | ORAL | Status: AC
  Administered 2020-05-13: 40 meq via ORAL
  Filled 2020-05-13: qty 2

## 2020-05-13 MED ORDER — ONDANSETRON HCL 4 MG/2ML IJ SOLN: 4.0000 mg | Freq: Four times a day (QID) | INTRAMUSCULAR | Status: DC | PRN

## 2020-05-13 MED ORDER — POTASSIUM CHLORIDE 10 MEQ/100ML IV SOLN
10.0000 meq | Freq: Once | INTRAVENOUS | Status: AC
  Administered 2020-05-13: 10 meq via INTRAVENOUS
  Filled 2020-05-13: qty 100

## 2020-05-13 MED ORDER — SODIUM CHLORIDE 0.9 % IV SOLN: 250.0000 mL | INTRAVENOUS | Status: DC | PRN

## 2020-05-13 MED ORDER — ACETAMINOPHEN 325 MG PO TABS
650.0000 mg | ORAL_TABLET | ORAL | Status: DC | PRN
  Administered 2020-05-13: 650 mg via ORAL
  Filled 2020-05-13: qty 2

## 2020-05-13 MED ORDER — SODIUM CHLORIDE 0.9 % IV BOLUS
1000.0000 mL | Freq: Once | INTRAVENOUS | Status: AC
  Administered 2020-05-13: 1000 mL via INTRAVENOUS

## 2020-05-13 MED ORDER — ATORVASTATIN CALCIUM 80 MG PO TABS
80.0000 mg | ORAL_TABLET | Freq: Every day | ORAL | Status: DC
  Administered 2020-05-13 – 2020-05-18 (×6): 80 mg via ORAL
  Filled 2020-05-13 (×6): qty 1

## 2020-05-13 MED ORDER — ASPIRIN EC 81 MG PO TBEC
81.0000 mg | DELAYED_RELEASE_TABLET | Freq: Every day | ORAL | Status: DC
  Administered 2020-05-15 – 2020-05-18 (×4): 81 mg via ORAL
  Filled 2020-05-13 (×4): qty 1

## 2020-05-13 MED ORDER — TOPIRAMATE 25 MG PO TABS
75.0000 mg | ORAL_TABLET | Freq: Every day | ORAL | Status: DC
  Administered 2020-05-13 – 2020-05-14 (×2): 75 mg via ORAL
  Administered 2020-05-15: 50 mg via ORAL
  Administered 2020-05-16 – 2020-05-18 (×3): 75 mg via ORAL
  Filled 2020-05-13 (×8): qty 3

## 2020-05-13 MED ORDER — INSULIN ASPART 100 UNIT/ML ~~LOC~~ SOLN
0.0000 [IU] | Freq: Three times a day (TID) | SUBCUTANEOUS | Status: DC
  Administered 2020-05-16: 3 [IU] via SUBCUTANEOUS
  Administered 2020-05-17 – 2020-05-19 (×3): 2 [IU] via SUBCUTANEOUS

## 2020-05-13 MED ORDER — ASPIRIN 81 MG PO CHEW
81.0000 mg | CHEWABLE_TABLET | ORAL | Status: AC
  Administered 2020-05-14: 81 mg via ORAL
  Filled 2020-05-13: qty 1

## 2020-05-13 NOTE — Progress Notes (Signed)
*  PRELIMINARY RESULTS* Echocardiogram 2D Echocardiogram has been performed.  Leavy Cella 05/13/2020, 12:46 PM

## 2020-05-13 NOTE — Progress Notes (Signed)
[place] for heparin Indication: chest pain/ACS  Allergies  Allergen Reactions  . Venlafaxine Nausea And Vomiting and Other (See Comments)    Dizziness, shakiness    Patient Measurements: Height: 5\' 2"  (157.5 cm) Weight: 124.7 kg (275 lb) IBW/kg (Calculated) : 50.1 HEPARIN DW (KG): 81.3  Vital Signs: Temp: 98.3 F (36.8 C) (01/12 1631) Temp Source: Oral (01/12 1631) BP: 99/67 (01/12 1800) Pulse Rate: 107 (01/12 1800)  Labs: Recent Labs    05/13/20 1005 05/13/20 1221 05/13/20 1805  HGB 10.3*  --   --   HCT 34.4*  --   --   PLT 312  --   --   APTT 32  --  42*  LABPROT 16.3*  --   --   INR 1.4*  --   --   HEPARINUNFRC >2.20*  --   --   CREATININE 1.39*  --   --   TROPONINIHS >27,000* >27,000*  --     Estimated Creatinine Clearance: 52.3 mL/min (A) (by C-G formula based on SCr of 1.39 mg/dL (H)).   Medical History: Past Medical History:  Diagnosis Date  . Abdominal fibromatosis   . Allergic rhinitis   . Anemia   . Asthma   . Essential hypertension   . GERD (gastroesophageal reflux disease)   . Headache(784.0)   . History of cardiac catheterization    No significant CAD May 2015  . History of migraine headaches   . Hyperlipidemia   . Migraines   . Obesity   . PAF (paroxysmal atrial fibrillation) ([place]) 08/2013  . Sleep apnea   . Type 2 diabetes mellitus (HCC)     Medications:  See med rec  Assessment: 65 yo female presented with chest pain. On chronic anticoagulation with eliquis and last dose was 1/11 at 2130.   Will start heparin and follow APTT as well as HL until they correlated.   Plan for cardiac cath in AM. APTT tonight came back subtherapeutic at 42, on 1000 units/hr. No s/sx of bleeding or infusion issues. Trop >27k.   Goal of Therapy:  Heparin level 0.3-0.7 units/ml aPTT 66-102 seconds Monitor platelets by anticoagulation protocol: Yes   Plan:  Increase heparin infusion to 1250 units/hr Check  anti-Xa level in ~6-8 hours and daily while on heparin Continue to monitor H&H and platelets  Antonietta Jewel, PharmD, [facility] Pharmacist  Phone: [phone] 05/13/2020 7:02 PM  Please check AMION for all [facility] phone numbers After 10:00 PM, call [facility] [phone]

## 2020-05-13 NOTE — ED Notes (Signed)
Pt to CT at this time.

## 2020-05-13 NOTE — ED Notes (Signed)
Report given to [facility] 

## 2020-05-13 NOTE — ED Notes (Signed)
[facility] at bedside.  ?

## 2020-05-13 NOTE — ED Provider Notes (Signed)
[facility] EMERGENCY DEPARTMENT Provider Note   CSN: [id number] Arrival date & time: 05/13/20  5701     History Chief Complaint  Patient presents with  . Emesis    Monique Gomez is a 65 y.o. female.  Presents to ER chief complaint of generalized malaise vomiting diarrhea and chest and back pain.  She states symptoms started 3 weeks ago with diarrhea and generalized weakness and fatigue.  Last night she started develop chest pain and back pain described as sharp and aching and persistent.  However when she woke up this morning and got up she no longer had any chest pain but continued to have other symptoms and presented to the ER.  She states she only has chest pain when she lies flat.  Currently denies any chest pressure or discomfort.  No shortness of breath.  No fever no cough.        Past Medical History:  Diagnosis Date  . Abdominal fibromatosis   . Allergic rhinitis   . Anemia   . Asthma   . Essential hypertension   . GERD (gastroesophageal reflux disease)   . Headache(784.0)   . History of cardiac catheterization    No significant CAD May 2015  . History of migraine headaches   . Hyperlipidemia   . Migraines   . Obesity   . PAF (paroxysmal atrial fibrillation) (HCC) 08/2013  . Sleep apnea   . Type 2 diabetes mellitus [facility])     Patient Active Problem List   Diagnosis Date Noted  . Vitamin D deficiency 07/18/2019  . Empty sella syndrome (HCC) 10/24/2018  . Screening for colon cancer 01/07/2015  . Hemorrhoids 11/25/2014  . Obstructive sleep apnea 10/14/2013  . PAF (paroxysmal atrial fibrillation) (HCC) 09/06/2013  . Lumbar back pain 09/06/2013  . NSTEMI (non-ST elevated myocardial infarction) (HCC) 09/03/2013  . MDD (major depressive disorder), single episode 02/04/2013  . Anxiety state, unspecified 02/04/2013  . Hyperlipidemia associated with type 2 diabetes mellitus (HCC) 06/06/2012  . Carpal tunnel syndrome of left wrist 12/27/2011  . De Quervain's  disease (tenosynovitis) 10/09/2011  . Heartburn 02/27/2011  . Allergic rhinitis 08/29/2010  . Anemia 02/15/2010  . Hyperlipidemia 12/23/2009  . Class 3 obesity 12/23/2009  . Chronic migraine 12/23/2009    Past Surgical History:  Procedure Laterality Date  . CATARACT EXTRACTION    . CATARACT EXTRACTION W/PHACO Right 04/05/2019   Procedure: CATARACT EXTRACTION PHACO AND INTRAOCULAR LENS PLACEMENT RIGHT EYE (CDE: 3.19);  Surgeon: Fabio Pierce, MD;  Location: [facility];  Service: Ophthalmology;  Laterality: Right;  . COLONOSCOPY N/A 02/02/2015   SLF: 1. one colon polyp removed-no source for anemia identified. 2. moderate diverticulosis noted in the sigmoid colon and descending colon 3. the left colon is redundant 4. Rectal bleeding due ot small internal hemorroids 5. Moderate sized external hemorrhoids.  . ESOPHAGOGASTRODUODENOSCOPY N/A 02/02/2015   SLF: 1. 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 large hiatal hernia 3. mild non-erosive gastritis 4. No source for anemia identified.   Marland Kitchen LEFT HEART CATHETERIZATION WITH CORONARY ANGIOGRAM N/A 09/03/2013   Procedure: LEFT HEART CATHETERIZATION WITH CORONARY ANGIOGRAM;  Surgeon: Corky Crafts, MD;  Location: [facility] CATH LAB;  Service: Cardiovascular;  Laterality: N/A;  . Right carpal tunnel release       OB History    Gravida  4   Para  2   Term      Preterm      AB      Living  SAB      IAB      Ectopic      Multiple      Live Births              Family History  Problem Relation Age of Onset  . Diabetes Mother   . Hypertension Mother   . Heart disease Mother   . High Cholesterol Mother   . Alzheimer's disease Father   . Hypertension Sister   . Hypertension Brother   . Diabetes Brother   . Arthritis Sister   . Hypertension Sister   . High Cholesterol Sister   . Diabetes Son   . Developmental delay Son   . Colon cancer Neg Hx     Social History   Tobacco Use  . Smoking status: Former Smoker     Packs/day: 0.30    Types: Cigarettes    Quit date: 04/01/2012    Years since quitting: 8.1  . Smokeless tobacco: Never Used  Vaping Use  . Vaping Use: Never used  Substance Use Topics  . Alcohol use: Yes    Alcohol/week: 0.0 standard drinks    Comment: very occasional  . Drug use: No    Home Medications Prior to Admission medications   Medication Sig Start Date End Date Taking? Authorizing Provider  acetaminophen (TYLENOL) 500 MG tablet Take 1 tablet (500 mg total) by mouth every 6 (six) hours as needed. 01/11/20  Yes Avegno, Darrelyn Hillock, FNP  AIMOVIG 70 MG/ML SOAJ INJECT 70 MG INTO THE SKIN EVERY 30 DAYS 05/05/20  Yes Ward Givens, NP  atenolol (TENORMIN) 50 MG tablet Take 1 tablet by mouth once daily 02/05/20  Yes Geddes, Modena Nunnery, MD  atorvastatin (LIPITOR) 80 MG tablet Take 1 tablet by mouth once daily 02/21/20  Yes Pickard, Cammie Mcgee, MD  cyclobenzaprine (FLEXERIL) 5 MG tablet Take 1 tablet (5 mg total) by mouth 3 (three) times daily as needed. 03/19/20  Yes Carole Civil, MD  ELIQUIS 5 MG TABS tablet Take 1 tablet by mouth twice daily 02/25/20  Yes Daggett, Modena Nunnery, MD  fluticasone [facility]) 50 MCG/ACT nasal spray Place 2 sprays into both nostrils daily.   Yes [provider]  ipratropium (ATROVENT) 0.03 % nasal spray Place 2 sprays into both nostrils every 12 (twelve) hours. 11/27/19  Yes Tremonton, Modena Nunnery, MD  Iron-FA-B Cmp-C-Biot-Probiotic (FUSION PLUS) CAPS Take 2 tablets by mouth daily. 10/11/18  Yes Sedgwick, Modena Nunnery, MD  lisinopril (ZESTRIL) 40 MG tablet Take 1 tablet (40 mg total) by mouth daily. 10/01/19  Yes Verta Ellen., NP  metFORMIN (GLUCOPHAGE) 500 MG tablet TAKE 1 TABLET BY MOUTH TWICE DAILY WITH A MEAL 04/17/20  Yes Biola, Modena Nunnery, MD  metoCLOPramide (REGLAN) 10 MG tablet Take 1 tablet (10 mg total) by mouth every 8 (eight) hours as needed for up to 7 days for nausea (Headache). 10/13/18  Yes Noemi Chapel, MD  ondansetron (ZOFRAN ODT) 4 MG  disintegrating tablet Take 1 tablet (4 mg total) by mouth every 8 (eight) hours as needed. 10/30/18  Yes Marcial Pacas, MD  pantoprazole (PROTONIX) 40 MG tablet Take 1 tablet by mouth twice daily 01/27/20  Yes Upton, Modena Nunnery, MD  predniSONE (DELTASONE) 10 MG tablet Take 2 tablets (20 mg total) by mouth daily. 01/11/20  Yes Avegno, Darrelyn Hillock, FNP  Semaglutide 7 MG TABS Take 7 mg by mouth daily before breakfast. 12/18/19  Yes Penrose, Modena Nunnery, MD  SUMAtriptan [facility]) 6 MG/0.5ML SOLN injection  Inject 0.5 mLs (6 mg total) into the skin every 2 (two) hours as needed for migraine or headache. May repeat in 2 hours if headache persists or recurs. 12/03/18  Yes Marcial Pacas, MD  topiramate (TOPAMAX) 50 MG tablet Take 1.5 tablets (75 mg total) by mouth at bedtime. 01/21/20  Yes Ward Givens, NP  traMADol (ULTRAM) 50 MG tablet Take by mouth every 6 (six) hours as needed.   Yes [provider]  Ubrogepant (UBRELVY) 50 MG TABS Take 1 tablet at the onset of a migraine.  Can repeat in 2 hours if needed. 01/27/20  Yes Ward Givens, NP  tiZANidine (ZANAFLEX) 4 MG tablet Take 1 tablet (4 mg total) by mouth every 6 (six) hours as needed for muscle spasms. Patient not taking: Reported on 05/13/2020 11/19/19   Carole Civil, MD    Allergies    Venlafaxine  Review of Systems   Review of Systems  Constitutional: Negative for fever.  HENT: Negative for ear pain.   Eyes: Negative for pain.  Respiratory: Negative for cough.   Cardiovascular: Positive for chest pain.  Gastrointestinal: Negative for abdominal pain.  Genitourinary: Negative for flank pain.  Musculoskeletal: Negative for back pain.  Skin: Negative for rash.  Neurological: Negative for headaches.    Physical Exam Updated Vital Signs BP 113/90   Pulse (!) 109   Temp 98.1 F (36.7 C) (Oral)   Resp (!) 23   Ht 5\' 2"  (1.575 m)   Wt 124.7 kg   SpO2 99%   BMI 50.30 kg/m   Physical Exam Constitutional:      General: She is not in  acute distress.    Appearance: Normal appearance.  HENT:     Head: Normocephalic.     Nose: Nose normal.  Eyes:     Extraocular Movements: Extraocular movements intact.  Cardiovascular:     Rate and Rhythm: Normal rate.  Pulmonary:     Effort: Pulmonary effort is normal.  Musculoskeletal:        General: Normal range of motion.     Cervical back: Normal range of motion.  Neurological:     General: No focal deficit present.     Mental Status: She is alert. Mental status is at baseline.     ED Results / Procedures / Treatments   Labs (all labs ordered are listed, but only abnormal results are displayed) Labs Reviewed  COMPREHENSIVE METABOLIC PANEL - Abnormal; Notable for the following components:      Result Value   Sodium 134 (*)    Potassium 3.0 (*)    Glucose, Bld 143 (*)    BUN 24 (*)    Creatinine, Ser 1.39 (*)    Albumin 3.4 (*)    AST 166 (*)    GFR, Estimated 43 (*)    All other components within normal limits  CBC - Abnormal; Notable for the following components:   WBC 14.5 (*)    RBC 5.25 (*)    Hemoglobin 10.3 (*)    HCT 34.4 (*)    MCV 65.5 (*)    MCH 19.6 (*)    MCHC 29.9 (*)    RDW 19.3 (*)    All other components within normal limits  PROTIME-INR - Abnormal; Notable for the following components:   Prothrombin Time 16.3 (*)    INR 1.4 (*)    All other components within normal limits  LIPID PANEL - Abnormal; Notable for the following components:   Triglycerides 156 (*)  HDL 24 (*)    LDL Cholesterol 107 (*)    All other components within normal limits  TROPONIN I (HIGH SENSITIVITY) - Abnormal; Notable for the following components:   Troponin I (High Sensitivity) >27,000 (*)    All other components within normal limits  RESP PANEL BY RT-PCR (FLU A&B, COVID) ARPGX2  LIPASE, BLOOD  APTT  URINALYSIS, ROUTINE W REFLEX MICROSCOPIC  HEMOGLOBIN A1C  HEPARIN LEVEL (UNFRACTIONATED)  APTT  BRAIN NATRIURETIC PEPTIDE  TROPONIN I (HIGH SENSITIVITY)     EKG Concerning for ST elevation MI in the inferior leads, ST elevation in the lateral leads, ST depression in aVL, tachycardic rate 105 bpm.  Radiology CT Angio Chest PE W/Cm &/Or Wo Cm  Result Date: 05/13/2020 CLINICAL DATA:  Nausea, vomiting, weakness, and fatigue since Thursday, chest pressure, back pain and diarrhea since last night, fever to 100.5 degrees Thursday, history hypertension, GERD, type II diabetes mellitus, abdominal fibromatosis, atrial fibrillation, former smoker EXAM: CT ANGIOGRAPHY CHEST WITH CONTRAST TECHNIQUE: Multidetector CT imaging of the chest was performed using the standard protocol during bolus administration of intravenous contrast. Multiplanar CT image reconstructions and MIPs were obtained to evaluate the vascular anatomy. CONTRAST:  87mL OMNIPAQUE IOHEXOL 350 MG/ML SOLN IV COMPARISON:  None FINDINGS: Cardiovascular: Atherosclerotic calcifications aorta and coronary arteries. Minimal enlargement of heart with thickening of LV wall and septum consistent with LEFT ventricular hypertrophy. Minimal pericardial fluid. Pulmonary arteries adequately opacified and patent. No evidence of pulmonary embolism. Mediastinum/Nodes: Small hiatal hernia. Esophagus unremarkable. Base of cervical region normal appearance. No thoracic adenopathy. Few normal sized mediastinal and axillary lymph nodes noted. Lungs/Pleura: Minimal subsegmental atelectasis in the lungs. Lungs otherwise clear. No pulmonary infiltrate, pleural effusion, or pneumothorax. No mass/nodule. Upper Abdomen: Visualized upper abdomen unremarkable Musculoskeletal: Unremarkable Review of the MIP images confirms the above findings. IMPRESSION: No evidence of pulmonary embolism. Minimal subsegmental atelectasis in the lungs. Small hiatal hernia. Aortic Atherosclerosis (ICD10-I70.0). Electronically Signed   By: Lavonia Dana M.D.   On: 05/13/2020 11:50   DG Chest Port 1 View  Result Date: 05/13/2020 CLINICAL DATA:  Pain and  fatigue.  Recent fever EXAM: PORTABLE CHEST 1 VIEW COMPARISON:  February 12, 2019 FINDINGS: There is no edema or airspace opacity. Heart is mildly enlarged with pulmonary vascular normal. There is prominent epicardial fat along the left heart border. No adenopathy. No pneumothorax. No bone lesions. IMPRESSION: Stable cardiac prominence.  No edema or airspace opacity. Electronically Signed   By: Lowella Grip III M.D.   On: 05/13/2020 10:45    Procedures .Critical Care Performed by: Luna Fuse, MD Authorized by: Luna Fuse, MD   Critical care provider statement:    Critical care time (minutes):  40   Critical care time was exclusive of:  Separately billable procedures and treating other patients   Critical care was necessary to treat or prevent imminent or life-threatening deterioration of the following conditions:  Cardiac failure Comments:     STEMI   (including critical care time)  Medications Ordered in ED Medications  0.9 %  sodium chloride infusion (has no administration in time range)  heparin ADULT infusion 100 units/mL (25000 units/262mL) (1,000 Units/hr Intravenous New Bag/Given 05/13/20 1107)  aspirin chewable tablet 324 mg (324 mg Oral Given 05/13/20 1010)  heparin injection 4,000 Units (4,000 Units Intravenous Given 05/13/20 1011)  potassium chloride 10 mEq in 100 mL IVPB (10 mEq Intravenous New Bag/Given 05/13/20 1057)  potassium chloride (KLOR-CON) packet 40 mEq (40 mEq Oral Given  05/13/20 1058)  sodium chloride 0.9 % bolus 1,000 mL (1,000 mLs Intravenous New Bag/Given 05/13/20 1057)  iohexol (OMNIPAQUE) 350 MG/ML injection 80 mL (80 mLs Intravenous Contrast Given 05/13/20 1130)    ED Course  I have reviewed the triage vital signs and the nursing notes.  Pertinent labs & imaging results that were available during my care of the patient were reviewed by me and considered in my medical decision making (see chart for details).    MDM Rules/Calculators/A&P                           Patient EKG brought to me, consistent with ST elevation MI in the inferior and lateral leads.  Rate 105 bpm.  STEMI alert was activated immediately, case discussed with cardiologist Sophronia Simas, advised continuing heparin at this time but to manage medically as the patient has no chest pain currently.  Patient given heparin bolus and drip as well as aspirin 324 mg orally.  Labs returned troponin greater than 27,000.  Cardiology reconsulted.  Patient to be transferred to [facility] to cardiac CCU for further care.   Final Clinical Impression(s) / ED Diagnoses Final diagnoses:  ACS (acute coronary syndrome) ([place])  Acute myocardial infarction, unspecified MI type, unspecified artery [facility])    Rx / DC Orders ED Discharge Orders    None       Luna Fuse, MD 05/13/20 1226

## 2020-05-13 NOTE — ED Triage Notes (Signed)
Pt presents to ED with complaints of nausea, vomiting, weakness, fatigue since Thursday. Pt states she started with chest pressure, back pain and diarrhea since last night. Pt states she has had fever up to 100.5 since Thursday.

## 2020-05-13 NOTE — ED Notes (Signed)
Report given to 2H -  

## 2020-05-13 NOTE — Progress Notes (Signed)
ANTICOAGULATION CONSULT NOTE - Initial Consult  Pharmacy Consult for heparin Indication: chest pain/ACS  Allergies  Allergen Reactions  . Venlafaxine Nausea And Vomiting and Other (See Comments)    Dizziness, shakiness    Patient Measurements: Height: 5\' 2"  (157.5 cm) Weight: 124.7 kg (275 lb) IBW/kg (Calculated) : 50.1 HEPARIN DW (KG): 81.3  Vital Signs: Temp: 98.1 F (36.7 C) (01/12 0942) Temp Source: Oral (01/12 0942) BP: 119/84 (01/12 1012) Pulse Rate: 107 (01/12 0942)  Labs: Recent Labs    05/13/20 1005  CREATININE 1.39*    Estimated Creatinine Clearance: 52.3 mL/min (A) (by C-G formula based on SCr of 1.39 mg/dL (H)).   Medical History: Past Medical History:  Diagnosis Date  . Abdominal fibromatosis   . Allergic rhinitis   . Anemia   . Asthma   . Essential hypertension   . GERD (gastroesophageal reflux disease)   . Headache(784.0)   . History of cardiac catheterization    No significant CAD May 2015  . History of migraine headaches   . Hyperlipidemia   . Migraines   . Obesity   . PAF (paroxysmal atrial fibrillation) ([place]) 08/2013  . Sleep apnea   . Type 2 diabetes mellitus (HCC)     Medications:  See med rec  Assessment: 65 yo female presented with chest pain. On chronic anticoagulation with eliquis and last dose was 1/11 at 2130. Will start heparin and follow APTT as well as HL until they correlated.   Goal of Therapy:  Heparin level 0.3-0.7 units/ml aPTT 66-102 seconds Monitor platelets by anticoagulation protocol: Yes   Plan:  Start heparin infusion at 1000 units/hr Check anti-Xa level in ~6-8 hours and daily while on heparin Continue to monitor H&H and platelets  Isac Sarna, BS Vena Austria, BCPS Clinical Pharmacist Pager [phone] 05/13/2020,10:43 AM

## 2020-05-13 NOTE — ED Notes (Signed)
CRITICAL VALUE ALERT  Critical Value:  Trop >27,000  Date & Time Notied:  05/13/2018 1114  Provider Notified: Dr. Almyra Free  Orders Received/Actions taken: see chart

## 2020-05-13 NOTE — Consult Note (Addendum)
Cardiology Consultation:   Patient ID: [name] MRN: [id number]; DOB: 09/11/1955  Admit date: 05/13/2020 Date of Consult: 05/13/2020  Primary Care Provider: Alycia Rossetti, MD [facility] Cardiologist: Monique Lesches, MD  [place] Electrophysiologist:  None    Patient Profile:   Monique Gomez is a 65 y.o. female with a hx of PAF who is being seen today for the evaluation of chest pain at the request of Dr Monique Gomez.   History of Present Illness:   Monique Gomez 65 yo female history of PAF, asthma, HTN, atypical chest pain history without significant CAD by cath 2015, OSA, DM2 presents with nausea, vomiting, fatigue, and chest pain.   Nausea, vomiting, diarrhea, fatigue, chills, fevers since Thursday. Overall poor oral intake over that time. She is vaccinated against covid x 2, COVID test here was negative. Starting Monday mild sharp chest pain midchest lasting just a few seconds, some SOB. On Tuesay 9/10 fullness pain midchest into shoulder blades constant x 6-7 hours, not positional.   Eliquis dose last dose 1/11 at 2130   ER vitals: p 107 bp 109/94 100% RA K 3 Cr 1.39 BUN 24 WBC 14.5 Hgb 10.3 Plt 312 LDL 107 BNP 1175 hstrop >27,000 COVID neg EKG SR, RBBB, ST elevation inferior leads and lateral precordial with reciprocal changes CXR no acute process CT PE: no PE, no acute process 03/2019 echo: LVEF 70-75%, moderate asymmetric septal hypertrophy, grad I diastolic dysfunction. Normal RV function.     Past Medical History:  Diagnosis Date  . Abdominal fibromatosis   . Allergic rhinitis   . Anemia   . Asthma   . Essential hypertension   . GERD (gastroesophageal reflux disease)   . Headache(784.0)   . History of cardiac catheterization    No significant CAD May 2015  . History of migraine headaches   . Hyperlipidemia   . Migraines   . Obesity   . PAF (paroxysmal atrial fibrillation) ([place]) 08/2013  . Sleep apnea   . Type 2 diabetes mellitus ([place])      Past Surgical History:  Procedure Laterality Date  . CATARACT EXTRACTION    . CATARACT EXTRACTION W/PHACO Right 04/05/2019   Procedure: CATARACT EXTRACTION PHACO AND INTRAOCULAR LENS PLACEMENT RIGHT EYE (CDE: 3.19);  Surgeon: Monique Goldmann, MD;  Location: [facility];  Service: Ophthalmology;  Laterality: Right;  . COLONOSCOPY N/A 02/02/2015   SLF: 1. one colon polyp removed-no source for anemia identified. 2. moderate diverticulosis noted in the sigmoid colon and descending colon 3. the left colon is redundant 4. Rectal bleeding due ot small internal hemorroids 5. Moderate sized external hemorrhoids.  . ESOPHAGOGASTRODUODENOSCOPY N/A 02/02/2015   SLF: 1. 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 large hiatal hernia 3. mild non-erosive gastritis 4. No source for anemia identified.   Marland Kitchen LEFT HEART CATHETERIZATION WITH CORONARY ANGIOGRAM N/A 09/03/2013   Procedure: LEFT HEART CATHETERIZATION WITH CORONARY ANGIOGRAM;  Surgeon: Monique Booze, MD;  Location: [facility] CATH LAB;  Service: Cardiovascular;  Laterality: N/A;  . Right carpal tunnel release       Inpatient Medications: Scheduled Meds:  Continuous Infusions: . sodium chloride    . heparin 1,000 Units/hr (05/13/20 1107)  . potassium chloride 10 mEq (05/13/20 1057)   PRN Meds:   Allergies:    Allergies  Allergen Reactions  . Venlafaxine Nausea And Vomiting and Other (See Comments)    Dizziness, shakiness    Social History:   Social History   Socioeconomic History  . Marital  status: Single    Spouse name: Not on file  . Number of children: 2  . Years of education: Not on file  . Highest education level: Not on file  Occupational History  . Occupation: Training and development officer: [employer]  Tobacco Use  . Smoking status: Former Smoker    Packs/day: 0.30    Types: Cigarettes    Quit date: 04/01/2012    Years since quitting: 8.1  . Smokeless tobacco: Never Used  Vaping Use  . Vaping Use: Never used   Substance and Sexual Activity  . Alcohol use: Yes    Alcohol/week: 0.0 standard drinks    Comment: very occasional  . Drug use: No  . Sexual activity: Yes  Other Topics Concern  . Not on file  Social History Narrative  . Not on file   Social Determinants of Health   Financial Resource Strain: Not on file  Food Insecurity: Not on file  Transportation Needs: Not on file  Physical Activity: Not on file  Stress: Not on file  Social Connections: Not on file  Intimate Partner Violence: Not on file    Family History:    Family History  Problem Relation Age of Onset  . Diabetes Mother   . Hypertension Mother   . Heart disease Mother   . High Cholesterol Mother   . Alzheimer's disease Father   . Hypertension Sister   . Hypertension Brother   . Diabetes Brother   . Arthritis Sister   . Hypertension Sister   . High Cholesterol Sister   . Diabetes Son   . Developmental delay Son   . Colon cancer Neg Hx      ROS:  Please see the history of present illness.  All other ROS reviewed and negative.     Physical Exam/Data:   Vitals:   05/13/20 1030 05/13/20 1100 05/13/20 1110 05/13/20 1115  BP: (!) 67/38 92/76 113/90   Pulse:    (!) 109  Resp: (!) 24 18 (!) 27 (!) 23  Temp:      TempSrc:      SpO2:    99%  Weight:      Height:       No intake or output data in the 24 hours ending 05/13/20 1154 Last 3 Weights 05/13/2020 04/16/2020 04/15/2020  Weight (lbs) 275 lb 280 lb 270 lb  Weight (kg) 124.739 kg 127.007 kg 122.471 kg     Body mass index is 50.3 kg/m.  General:  Well nourished, well developed, in no acute distress HEENT: normal Lymph: no adenopathy Neck: no JVD Endocrine:  No thryomegaly Vascular: No carotid bruits; FA pulses 2+ bilaterally without bruits  Cardiac:  normal S1, S2; RRR; no murmur  Lungs:  clear to auscultation bilaterally, no wheezing, rhonchi or rales  Abd: soft, nontender, no hepatomegaly  Ext: no edema Musculoskeletal:  No deformities, BUE  and BLE strength normal and equal Skin: warm and dry  Neuro:  CNs 2-12 intact, no focal abnormalities noted Psych:  Normal affect   EKG:  The EKG was personally reviewed and demonstrates:   Telemetry:  Telemetry was personally reviewed and demonstrates:     Laboratory Data:  High Sensitivity Troponin:   Recent Labs  Lab 05/13/20 1005  TROPONINIHS >27,000*     Chemistry Recent Labs  Lab 05/13/20 1005  NA 134*  K 3.0*  CL 98  CO2 23  GLUCOSE 143*  BUN 24*  CREATININE 1.39*  CALCIUM 9.0  GFRNONAA 43*  ANIONGAP 13    Recent Labs  Lab 05/13/20 1005  PROT 7.9  ALBUMIN 3.4*  AST 166*  ALT 41  ALKPHOS 59  BILITOT 0.6   Hematology Recent Labs  Lab 05/13/20 1005  WBC 14.5*  RBC 5.25*  HGB 10.3*  HCT 34.4*  MCV 65.5*  MCH 19.6*  MCHC 29.9*  RDW 19.3*  PLT 312   BNPNo results for input(s): BNP, PROBNP in the last 168 hours.  DDimer No results for input(s): DDIMER in the last 168 hours.   Radiology/Studies:  CT Angio Chest PE W/Cm &/Or Wo Cm  Result Date: 05/13/2020 CLINICAL DATA:  Nausea, vomiting, weakness, and fatigue since Thursday, chest pressure, back pain and diarrhea since last night, fever to 100.5 degrees Thursday, history hypertension, GERD, type II diabetes mellitus, abdominal fibromatosis, atrial fibrillation, former smoker EXAM: CT ANGIOGRAPHY CHEST WITH CONTRAST TECHNIQUE: Multidetector CT imaging of the chest was performed using the standard protocol during bolus administration of intravenous contrast. Multiplanar CT image reconstructions and MIPs were obtained to evaluate the vascular anatomy. CONTRAST:  37m OMNIPAQUE IOHEXOL 350 MG/ML SOLN IV COMPARISON:  None FINDINGS: Cardiovascular: Atherosclerotic calcifications aorta and coronary arteries. Minimal enlargement of heart with thickening of LV wall and septum consistent with LEFT ventricular hypertrophy. Minimal pericardial fluid. Pulmonary arteries adequately opacified and patent. No evidence of  pulmonary embolism. Mediastinum/Nodes: Small hiatal hernia. Esophagus unremarkable. Base of cervical region normal appearance. No thoracic adenopathy. Few normal sized mediastinal and axillary lymph nodes noted. Lungs/Pleura: Minimal subsegmental atelectasis in the lungs. Lungs otherwise clear. No pulmonary infiltrate, pleural effusion, or pneumothorax. No mass/nodule. Upper Abdomen: Visualized upper abdomen unremarkable Musculoskeletal: Unremarkable Review of the MIP images confirms the above findings. IMPRESSION: No evidence of pulmonary embolism. Minimal subsegmental atelectasis in the lungs. Small hiatal hernia. Aortic Atherosclerosis (ICD10-I70.0). Electronically Signed   By: MLavonia DanaM.D.   On: 05/13/2020 11:50   DG Chest Port 1 View  Result Date: 05/13/2020 CLINICAL DATA:  Pain and fatigue.  Recent fever EXAM: PORTABLE CHEST 1 VIEW COMPARISON:  February 12, 2019 FINDINGS: There is no edema or airspace opacity. Heart is mildly enlarged with pulmonary vascular normal. There is prominent epicardial fat along the left heart border. No adenopathy. No pneumothorax. No bone lesions. IMPRESSION: Stable cardiac prominence.  No edema or airspace opacity. Electronically Signed   By: WLowella GripIII M.D.   On: 05/13/2020 10:45     Assessment and Plan:   1. Elevated troponin/MI - sever elevation >27,000 on admission.  - EKG with new RBBB, inferior and lateral precordial leads without reciprocal changes - we were contacted by cath lab coordinator to evaluate patient after possible code stemi was cancelled by on call interventional Dr AFletcher Anon  - somewhat late presentation given symptoms yesterday evening at 6pm, pain Gomez on ER presentation and currently  - With her degree of trop elevation and CAD risk factors will require a cath - if by chance normal cath with her recent viral illness, character and duration of pain, pericardial effusion would think possible perimyocarditis, would need to consider  MRI at that point. I have added an ESR and CRP to labs and started colchcine - w/ soft bp's avoid beta blocker/ACE/ARB at this time  Eliquis dose last dose 1/11 at 2130  2. PAF - on eliquis at home, will hold in anticipation for cath.  - hold beta blocker due to soft bp's  3. AKI - N/V/D last several days with poor oral intake,  likely prerenal - received 1 L NS in ER, will continue IVFs at 151m/hr for additional 501m Prelim review of echo shows normal LVEF.   4. Leukocytosis - likely related to recent gastroenteritis, likely viral - COVID negative here, afebrile here  5. Hypotension - transient, resolved with IVFs. History of N/V/D, poor oral intake would suggest hypovolemia. Prelim review of echo shows normal LVEF and echo. Resolved easily with limited IVFs, do not suspect septic shock but add on lactic acid and procalcitonin. If trending up WBC or febrile during admit would pan culture.  - elevated BNP but CXR clear sats 100% on RA, does not appear fluid overloaded. Cautious IVFs.   6. Pericardial effusion - no evidence of tamponade - would repeat echo 48 hours - likely inflammatory from recent viral illness   [facility] Code STEMI previously cancelled, appears already set up for cath tomorrow.      For questions or updates, please contact [facility] consult [email or web address] for contact info under    Signed, BrCarlyle DollyMD  05/13/2020 11:54 AM

## 2020-05-13 NOTE — Progress Notes (Signed)
Progress Note  Patient Name: Monique Gomez Date of Encounter: 05/13/2020  [facility] Cardiologist: Rozann Lesches, MD   Subjective   Mild shortness of breath. Decreased appetite.  No chest pain.   Inpatient Medications    Scheduled Meds: . [START ON 05/14/2020] aspirin EC  81 mg Oral Daily  . atorvastatin  80 mg Oral q1800  . colchicine  0.6 mg Oral Daily  . fluticasone  2 spray Each Nare Daily  . insulin aspart  0-15 Units Subcutaneous TID WC  . ipratropium  2 spray Each Nare Q12H  . pantoprazole  40 mg Oral BID  . topiramate  75 mg Oral QHS   Continuous Infusions: . sodium chloride    . heparin 1,000 Units/hr (05/13/20 1445)   PRN Meds: acetaminophen, nitroGLYCERIN, ondansetron (ZOFRAN) IV   Vital Signs    Vitals:   05/13/20 1300 05/13/20 1441 05/13/20 1445 05/13/20 1631  BP: (!) 82/68 98/83 103/84   Pulse: (!) 102     Resp: 18 17 15    Temp:    98.3 F (36.8 C)  TempSrc:    Oral  SpO2: 100%  100%   Weight:      Height:        Intake/Output Summary (Last 24 hours) at 05/13/2020 1801 Last data filed at 05/13/2020 1445 Gross per 24 hour  Intake 1310.23 ml  Output 225 ml  Net 1085.23 ml   Last 3 Weights 05/13/2020 04/16/2020 04/15/2020  Weight (lbs) 275 lb 280 lb 270 lb  Weight (kg) 124.739 kg 127.007 kg 122.471 kg      Telemetry    Sinus tach - Personally Reviewed  ECG    NSR, inferolateral ST elevation - Personally Reviewed  Physical Exam   GEN: No acute distress.   Neck: No JVD Cardiac: RRR, no murmurs, rubs, or gallops.  Respiratory: Clear to auscultation bilaterally. GI: Soft, nontender, obese MS: No edema; No deformity. Neuro:  Nonfocal  Psych: Normal affect   Labs    High Sensitivity Troponin:   Recent Labs  Lab 05/13/20 1005 05/13/20 1221  TROPONINIHS >27,000* >27,000*      Chemistry Recent Labs  Lab 05/13/20 1005  NA 134*  K 3.0*  CL 98  CO2 23  GLUCOSE 143*  BUN 24*  CREATININE 1.39*  CALCIUM 9.0  PROT  7.9  ALBUMIN 3.4*  AST 166*  ALT 41  ALKPHOS 59  BILITOT 0.6  GFRNONAA 43*  ANIONGAP 13     Hematology Recent Labs  Lab 05/13/20 1005  WBC 14.5*  RBC 5.25*  HGB 10.3*  HCT 34.4*  MCV 65.5*  MCH 19.6*  MCHC 29.9*  RDW 19.3*  PLT 312    BNP Recent Labs  Lab 05/13/20 1005  BNP 1,175.0*     DDimer No results for input(s): DDIMER in the last 168 hours.   Radiology    CT Angio Chest PE W/Cm &/Or Wo Cm  Result Date: 05/13/2020 CLINICAL DATA:  Nausea, vomiting, weakness, and fatigue since Thursday, chest pressure, back pain and diarrhea since last night, fever to 100.5 degrees Thursday, history hypertension, GERD, type II diabetes mellitus, abdominal fibromatosis, atrial fibrillation, former smoker EXAM: CT ANGIOGRAPHY CHEST WITH CONTRAST TECHNIQUE: Multidetector CT imaging of the chest was performed using the standard protocol during bolus administration of intravenous contrast. Multiplanar CT image reconstructions and MIPs were obtained to evaluate the vascular anatomy. CONTRAST:  32mL OMNIPAQUE IOHEXOL 350 MG/ML SOLN IV COMPARISON:  None FINDINGS: Cardiovascular: Atherosclerotic calcifications aorta and  coronary arteries. Minimal enlargement of heart with thickening of LV wall and septum consistent with LEFT ventricular hypertrophy. Minimal pericardial fluid. Pulmonary arteries adequately opacified and patent. No evidence of pulmonary embolism. Mediastinum/Nodes: Small hiatal hernia. Esophagus unremarkable. Base of cervical region normal appearance. No thoracic adenopathy. Few normal sized mediastinal and axillary lymph nodes noted. Lungs/Pleura: Minimal subsegmental atelectasis in the lungs. Lungs otherwise clear. No pulmonary infiltrate, pleural effusion, or pneumothorax. No mass/nodule. Upper Abdomen: Visualized upper abdomen unremarkable Musculoskeletal: Unremarkable Review of the MIP images confirms the above findings. IMPRESSION: No evidence of pulmonary embolism. Minimal  subsegmental atelectasis in the lungs. Small hiatal hernia. Aortic Atherosclerosis (ICD10-I70.0). Electronically Signed   By: Lavonia Dana M.D.   On: 05/13/2020 11:50   DG Chest Port 1 View  Result Date: 05/13/2020 CLINICAL DATA:  Pain and fatigue.  Recent fever EXAM: PORTABLE CHEST 1 VIEW COMPARISON:  February 12, 2019 FINDINGS: There is no edema or airspace opacity. Heart is mildly enlarged with pulmonary vascular normal. There is prominent epicardial fat along the left heart border. No adenopathy. No pneumothorax. No bone lesions. IMPRESSION: Stable cardiac prominence.  No edema or airspace opacity. Electronically Signed   By: Lowella Grip III M.D.   On: 05/13/2020 10:45   ECHOCARDIOGRAM COMPLETE  Result Date: 05/13/2020    ECHOCARDIOGRAM REPORT   Patient Name:   Monique Gomez [facility] Date of Exam: 05/13/2020 Medical Rec #:  [id number]         Height:       62.0 in Accession #:    [id number]        Weight:       275.0 lb Date of Birth:  [birth year]         BSA:          2.189 m Patient Age:    65 years          BP:           113/90 mmHg Patient Gender: F                 HR:           111 bpm. Exam Location:  [facility] Procedure: 2D Echo STAT ECHO Indications:    Dyspnea R06.00  History:        Patient has prior history of Echocardiogram examinations, most                 recent 03/14/2019. Previous Myocardial Infarction,                 Arrythmias:Atrial Fibrillation; Risk Factors:Dyslipidemia and                 Former Smoker.  Sonographer:    Leavy Cella RDCS (AE) Referring Phys: [id number] [place]  1. Left ventricular ejection fraction, by estimation, is 50%. The left ventricle has normal function. Left ventricular endocardial border not optimally defined to evaluate regional wall motion. There is moderate left ventricular hypertrophy. Left ventricular diastolic parameters are indeterminate.  2. Right ventricular systolic function is normal. The right ventricular size is normal.  3.  There is indentation of the RA without collapse, no RV collapse. Borderline MV respiratory variation of 20%. Normal IVC. Overall findings are not consistent with tamponade physiology. Would repeat study 48 hours. Marland Kitchen a small pericardial effusion is present. The pericardial effusion is circumferential. There is no evidence of cardiac tamponade.  4. The mitral valve is normal in structure. No evidence of mitral valve  regurgitation. No evidence of mitral stenosis.  5. The aortic valve has an indeterminant number of cusps. Aortic valve regurgitation is not visualized. No aortic stenosis is present.  6. The inferior vena cava is normal in size with greater than 50% respiratory variability, suggesting right atrial pressure of 3 mmHg. FINDINGS  Left Ventricle: Left ventricular ejection fraction, by estimation, is 50%. The left ventricle has normal function. Left ventricular endocardial border not optimally defined to evaluate regional wall motion. The left ventricular internal cavity size was normal in size. There is moderate left ventricular hypertrophy. Left ventricular diastolic parameters are indeterminate. Right Ventricle: The right ventricular size is normal. No increase in right ventricular wall thickness. Right ventricular systolic function is normal. Left Atrium: Left atrial size was normal in size. Right Atrium: Right atrial size was normal in size. Pericardium: There is indentation of the RA without collapse, no RV collapse. Borderline MV respiratory variation of 20%. Normal IVC. Overall findings are not consistent with tamponade physiology. Would repeat study 48 hours. A small pericardial effusion  is present. The pericardial effusion is circumferential. There is no evidence of cardiac tamponade. Mitral Valve: The mitral valve is normal in structure. No evidence of mitral valve regurgitation. No evidence of mitral valve stenosis. Tricuspid Valve: The tricuspid valve is normal in structure. Tricuspid valve  regurgitation is trivial. No evidence of tricuspid stenosis. Aortic Valve: The aortic valve has an indeterminant number of cusps. Aortic valve regurgitation is not visualized. No aortic stenosis is present. Aortic valve mean gradient measures 6.6 mmHg. Aortic valve peak gradient measures 12.5 mmHg. Pulmonic Valve: The pulmonic valve was not well visualized. Pulmonic valve regurgitation is not visualized. No evidence of pulmonic stenosis. Aorta: The aortic root is normal in size and structure. Venous: The inferior vena cava is normal in size with greater than 50% respiratory variability, suggesting right atrial pressure of 3 mmHg. IAS/Shunts: No atrial level shunt detected by color flow Doppler.  LEFT VENTRICLE PLAX 2D LVIDd:         2.78 cm Diastology LVIDs:         1.97 cm LV e' medial:   4.82 cm/s LV PW:         1.94 cm LV E/e' medial: 29.0 LV IVS:        1.63 cm  RIGHT VENTRICLE RV S prime:     17.00 cm/s TAPSE (M-mode): 2.1 cm LEFT ATRIUM             Index       RIGHT ATRIUM           Index LA diam:        3.90 cm 1.78 cm/m  RA Area:     12.80 cm LA Vol (A2C):   52.0 ml 23.76 ml/m RA Volume:   34.60 ml  15.81 ml/m LA Vol (A4C):   54.5 ml 24.90 ml/m LA Biplane Vol: 53.0 ml 24.21 ml/m  AORTIC VALVE AV Vmax:           176.46 cm/s AV Vmean:          122.540 cm/s AV VTI:            0.233 m AV Peak Grad:      12.5 mmHg AV Mean Grad:      6.6 mmHg LVOT Vmax:         168.45 cm/s LVOT Vmean:        95.985 cm/s LVOT VTI:  0.183 m LVOT/AV VTI ratio: 0.78  AORTA Ao Root diam: 2.50 cm MITRAL VALVE                TRICUSPID VALVE MV Area (PHT): 7.82 cm     TR Peak grad:   26.4 mmHg MV Decel Time: 97 msec      TR Vmax:        257.00 cm/s MV E velocity: 140.00 cm/s MV A velocity: 52.90 cm/s   SHUNTS MV E/A ratio:  2.65         Systemic VTI: 0.18 m Carlyle Dolly MD Electronically signed by Carlyle Dolly MD Signature Date/Time: 05/13/2020/1:04:08 PM    Final     Cardiac Studies   Echo- low normal EF,  LVH  Patient Profile     65 y.o. 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  Troponin elevated > 27K on initial check.  Discussed with Dr. Harl Bowie and Dr. Fletcher Anon.   Assessment & Plan    Eliquis on hold.  Plan is for cath in AM.  Last dose was 1/11 evening.    Had viral illness sx over the past week.  She thought she had COVID but test was negative.  No known exposure but she works as a Education officer, museum so sees many people.   Hydration precath.  No airspace disease noted by CXR.    Hypokalemia: will give potassium 40 mEq x 1 to help get it to > 4.0  For questions or updates, please contact [place] Please consult [email or web address] for contact info under        Signed, Larae Grooms, MD  05/13/2020, 6:01 PM

## 2020-05-13 NOTE — H&P (View-Only) (Signed)
Progress Note  Patient Name: Monique Gomez Date of Encounter: 05/13/2020  [facility] Cardiologist: Rozann Lesches, MD   Subjective   Mild shortness of breath. Decreased appetite.  No chest pain.   Inpatient Medications    Scheduled Meds: . [START ON 05/14/2020] aspirin EC  81 mg Oral Daily  . atorvastatin  80 mg Oral q1800  . colchicine  0.6 mg Oral Daily  . fluticasone  2 spray Each Nare Daily  . insulin aspart  0-15 Units Subcutaneous TID WC  . ipratropium  2 spray Each Nare Q12H  . pantoprazole  40 mg Oral BID  . topiramate  75 mg Oral QHS   Continuous Infusions: . sodium chloride    . heparin 1,000 Units/hr (05/13/20 1445)   PRN Meds: acetaminophen, nitroGLYCERIN, ondansetron (ZOFRAN) IV   Vital Signs    Vitals:   05/13/20 1300 05/13/20 1441 05/13/20 1445 05/13/20 1631  BP: (!) 82/68 98/83 103/84   Pulse: (!) 102     Resp: 18 17 15    Temp:    98.3 F (36.8 C)  TempSrc:    Oral  SpO2: 100%  100%   Weight:      Height:        Intake/Output Summary (Last 24 hours) at 05/13/2020 1801 Last data filed at 05/13/2020 1445 Gross per 24 hour  Intake 1310.23 ml  Output 225 ml  Net 1085.23 ml   Last 3 Weights 05/13/2020 04/16/2020 04/15/2020  Weight (lbs) 275 lb 280 lb 270 lb  Weight (kg) 124.739 kg 127.007 kg 122.471 kg      Telemetry    Sinus tach - Personally Reviewed  ECG    NSR, inferolateral ST elevation - Personally Reviewed  Physical Exam   GEN: No acute distress.   Neck: No JVD Cardiac: RRR, no murmurs, rubs, or gallops.  Respiratory: Clear to auscultation bilaterally. GI: Soft, nontender, obese MS: No edema; No deformity. Neuro:  Nonfocal  Psych: Normal affect   Labs    High Sensitivity Troponin:   Recent Labs  Lab 05/13/20 1005 05/13/20 1221  TROPONINIHS >27,000* >27,000*      Chemistry Recent Labs  Lab 05/13/20 1005  NA 134*  K 3.0*  CL 98  CO2 23  GLUCOSE 143*  BUN 24*  CREATININE 1.39*  CALCIUM 9.0  PROT  7.9  ALBUMIN 3.4*  AST 166*  ALT 41  ALKPHOS 59  BILITOT 0.6  GFRNONAA 43*  ANIONGAP 13     Hematology Recent Labs  Lab 05/13/20 1005  WBC 14.5*  RBC 5.25*  HGB 10.3*  HCT 34.4*  MCV 65.5*  MCH 19.6*  MCHC 29.9*  RDW 19.3*  PLT 312    BNP Recent Labs  Lab 05/13/20 1005  BNP 1,175.0*     DDimer No results for input(s): DDIMER in the last 168 hours.   Radiology    CT Angio Chest PE W/Cm &/Or Wo Cm  Result Date: 05/13/2020 CLINICAL DATA:  Nausea, vomiting, weakness, and fatigue since Thursday, chest pressure, back pain and diarrhea since last night, fever to 100.5 degrees Thursday, history hypertension, GERD, type II diabetes mellitus, abdominal fibromatosis, atrial fibrillation, former smoker EXAM: CT ANGIOGRAPHY CHEST WITH CONTRAST TECHNIQUE: Multidetector CT imaging of the chest was performed using the standard protocol during bolus administration of intravenous contrast. Multiplanar CT image reconstructions and MIPs were obtained to evaluate the vascular anatomy. CONTRAST:  61mL OMNIPAQUE IOHEXOL 350 MG/ML SOLN IV COMPARISON:  None FINDINGS: Cardiovascular: Atherosclerotic calcifications aorta and  coronary arteries. Minimal enlargement of heart with thickening of LV wall and septum consistent with LEFT ventricular hypertrophy. Minimal pericardial fluid. Pulmonary arteries adequately opacified and patent. No evidence of pulmonary embolism. Mediastinum/Nodes: Small hiatal hernia. Esophagus unremarkable. Base of cervical region normal appearance. No thoracic adenopathy. Few normal sized mediastinal and axillary lymph nodes noted. Lungs/Pleura: Minimal subsegmental atelectasis in the lungs. Lungs otherwise clear. No pulmonary infiltrate, pleural effusion, or pneumothorax. No mass/nodule. Upper Abdomen: Visualized upper abdomen unremarkable Musculoskeletal: Unremarkable Review of the MIP images confirms the above findings. IMPRESSION: No evidence of pulmonary embolism. Minimal  subsegmental atelectasis in the lungs. Small hiatal hernia. Aortic Atherosclerosis (ICD10-I70.0). Electronically Signed   By: Lavonia Dana M.D.   On: 05/13/2020 11:50   DG Chest Port 1 View  Result Date: 05/13/2020 CLINICAL DATA:  Pain and fatigue.  Recent fever EXAM: PORTABLE CHEST 1 VIEW COMPARISON:  February 12, 2019 FINDINGS: There is no edema or airspace opacity. Heart is mildly enlarged with pulmonary vascular normal. There is prominent epicardial fat along the left heart border. No adenopathy. No pneumothorax. No bone lesions. IMPRESSION: Stable cardiac prominence.  No edema or airspace opacity. Electronically Signed   By: Lowella Grip III M.D.   On: 05/13/2020 10:45   ECHOCARDIOGRAM COMPLETE  Result Date: 05/13/2020    ECHOCARDIOGRAM REPORT   Patient Name:   Monique Gomez [facility] Date of Exam: 05/13/2020 Medical Rec #:  [id number]         Height:       62.0 in Accession #:    [id number]        Weight:       275.0 lb Date of Birth:  [birth year]         BSA:          2.189 m Patient Age:    65 years          BP:           113/90 mmHg Patient Gender: F                 HR:           111 bpm. Exam Location:  [facility] Procedure: 2D Echo STAT ECHO Indications:    Dyspnea R06.00  History:        Patient has prior history of Echocardiogram examinations, most                 recent 03/14/2019. Previous Myocardial Infarction,                 Arrythmias:Atrial Fibrillation; Risk Factors:Dyslipidemia and                 Former Smoker.  Sonographer:    Leavy Cella RDCS (AE) Referring Phys: [id number] [place]  1. Left ventricular ejection fraction, by estimation, is 50%. The left ventricle has normal function. Left ventricular endocardial border not optimally defined to evaluate regional wall motion. There is moderate left ventricular hypertrophy. Left ventricular diastolic parameters are indeterminate.  2. Right ventricular systolic function is normal. The right ventricular size is normal.  3.  There is indentation of the RA without collapse, no RV collapse. Borderline MV respiratory variation of 20%. Normal IVC. Overall findings are not consistent with tamponade physiology. Would repeat study 48 hours. Marland Kitchen a small pericardial effusion is present. The pericardial effusion is circumferential. There is no evidence of cardiac tamponade.  4. The mitral valve is normal in structure. No evidence of mitral valve  regurgitation. No evidence of mitral stenosis.  5. The aortic valve has an indeterminant number of cusps. Aortic valve regurgitation is not visualized. No aortic stenosis is present.  6. The inferior vena cava is normal in size with greater than 50% respiratory variability, suggesting right atrial pressure of 3 mmHg. FINDINGS  Left Ventricle: Left ventricular ejection fraction, by estimation, is 50%. The left ventricle has normal function. Left ventricular endocardial border not optimally defined to evaluate regional wall motion. The left ventricular internal cavity size was normal in size. There is moderate left ventricular hypertrophy. Left ventricular diastolic parameters are indeterminate. Right Ventricle: The right ventricular size is normal. No increase in right ventricular wall thickness. Right ventricular systolic function is normal. Left Atrium: Left atrial size was normal in size. Right Atrium: Right atrial size was normal in size. Pericardium: There is indentation of the RA without collapse, no RV collapse. Borderline MV respiratory variation of 20%. Normal IVC. Overall findings are not consistent with tamponade physiology. Would repeat study 48 hours. A small pericardial effusion  is present. The pericardial effusion is circumferential. There is no evidence of cardiac tamponade. Mitral Valve: The mitral valve is normal in structure. No evidence of mitral valve regurgitation. No evidence of mitral valve stenosis. Tricuspid Valve: The tricuspid valve is normal in structure. Tricuspid valve  regurgitation is trivial. No evidence of tricuspid stenosis. Aortic Valve: The aortic valve has an indeterminant number of cusps. Aortic valve regurgitation is not visualized. No aortic stenosis is present. Aortic valve mean gradient measures 6.6 mmHg. Aortic valve peak gradient measures 12.5 mmHg. Pulmonic Valve: The pulmonic valve was not well visualized. Pulmonic valve regurgitation is not visualized. No evidence of pulmonic stenosis. Aorta: The aortic root is normal in size and structure. Venous: The inferior vena cava is normal in size with greater than 50% respiratory variability, suggesting right atrial pressure of 3 mmHg. IAS/Shunts: No atrial level shunt detected by color flow Doppler.  LEFT VENTRICLE PLAX 2D LVIDd:         2.78 cm Diastology LVIDs:         1.97 cm LV e' medial:   4.82 cm/s LV PW:         1.94 cm LV E/e' medial: 29.0 LV IVS:        1.63 cm  RIGHT VENTRICLE RV S prime:     17.00 cm/s TAPSE (M-mode): 2.1 cm LEFT ATRIUM             Index       RIGHT ATRIUM           Index LA diam:        3.90 cm 1.78 cm/m  RA Area:     12.80 cm LA Vol (A2C):   52.0 ml 23.76 ml/m RA Volume:   34.60 ml  15.81 ml/m LA Vol (A4C):   54.5 ml 24.90 ml/m LA Biplane Vol: 53.0 ml 24.21 ml/m  AORTIC VALVE AV Vmax:           176.46 cm/s AV Vmean:          122.540 cm/s AV VTI:            0.233 m AV Peak Grad:      12.5 mmHg AV Mean Grad:      6.6 mmHg LVOT Vmax:         168.45 cm/s LVOT Vmean:        95.985 cm/s LVOT VTI:  0.183 m LVOT/AV VTI ratio: 0.78  AORTA Ao Root diam: 2.50 cm MITRAL VALVE                TRICUSPID VALVE MV Area (PHT): 7.82 cm     TR Peak grad:   26.4 mmHg MV Decel Time: 97 msec      TR Vmax:        257.00 cm/s MV E velocity: 140.00 cm/s MV A velocity: 52.90 cm/s   SHUNTS MV E/A ratio:  2.65         Systemic VTI: 0.18 m Carlyle Dolly MD Electronically signed by Carlyle Dolly MD Signature Date/Time: 05/13/2020/1:04:08 PM    Final     Cardiac Studies   Echo- low normal EF,  LVH  Patient Profile     65 y.o. 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  Troponin elevated > 27K on initial check.  Discussed with Dr. Harl Bowie and Dr. Fletcher Anon.   Assessment & Plan    Eliquis on hold.  Plan is for cath in AM.  Last dose was 1/11 evening.    Had viral illness sx over the past week.  She thought she had COVID but test was negative.  No known exposure but she works as a Education officer, museum so sees many people.   Hydration precath.  No airspace disease noted by CXR.    Hypokalemia: will give potassium 40 mEq x 1 to help get it to > 4.0  For questions or updates, please contact [place] Please consult [email or web address] for contact info under        Signed, Larae Grooms, MD  05/13/2020, 6:01 PM

## 2020-05-14 ENCOUNTER — Encounter (HOSPITAL_COMMUNITY): Payer: Self-pay | Admitting: Interventional Cardiology

## 2020-05-14 ENCOUNTER — Inpatient Hospital Stay (HOSPITAL_COMMUNITY)
Admission: EM | Disposition: A | Discharge status: Home / Self Care | Payer: Self-pay | Source: Home / Self Care | Attending: Interventional Cardiology

## 2020-05-14 DIAGNOSIS — I408 Other acute myocarditis: Secondary | ICD-10-CM | POA: Diagnosis not present

## 2020-05-14 DIAGNOSIS — I249 Acute ischemic heart disease, unspecified: Secondary | ICD-10-CM | POA: Diagnosis not present

## 2020-05-14 DIAGNOSIS — Z7901 Long term (current) use of anticoagulants: Secondary | ICD-10-CM

## 2020-05-14 DIAGNOSIS — I214 Non-ST elevation (NSTEMI) myocardial infarction: Secondary | ICD-10-CM | POA: Diagnosis not present

## 2020-05-14 HISTORY — PX: LEFT HEART CATH AND CORONARY ANGIOGRAPHY: CATH118249

## 2020-05-14 LAB — BASIC METABOLIC PANEL
Anion gap: 11 (ref 5–15)
Anion gap: 11 (ref 5–15)
BUN: 21 mg/dL (ref 8–23)
BUN: 20 mg/dL (ref 8–23)
CO2: 21 mmol/L — ABNORMAL LOW (ref 22–32)
CO2: 19 mmol/L — ABNORMAL LOW (ref 22–32)
Calcium: 8.8 mg/dL — ABNORMAL LOW (ref 8.9–10.3)
Calcium: 8.7 mg/dL — ABNORMAL LOW (ref 8.9–10.3)
Chloride: 104 mmol/L (ref 98–111)
Chloride: 107 mmol/L (ref 98–111)
Creatinine, Ser: 1.14 mg/dL — ABNORMAL HIGH (ref 0.44–1.00)
Creatinine, Ser: 0.99 mg/dL (ref 0.44–1.00)
GFR, Estimated: 54 mL/min — ABNORMAL LOW (ref >60)
GFR, Estimated: >60 mL/min (ref >60)
Glucose, Bld: 103 mg/dL — ABNORMAL HIGH (ref 70–99)
Glucose, Bld: 99 mg/dL (ref 70–99)
Potassium: 3.3 mmol/L — ABNORMAL LOW (ref 3.5–5.1)
Potassium: 4.4 mmol/L (ref 3.5–5.1)
Sodium: 136 mmol/L (ref 135–145)
Sodium: 137 mmol/L (ref 135–145)

## 2020-05-14 LAB — CBC
HCT: 29.3 % — ABNORMAL LOW (ref 36.0–46.0)
HCT: 35.5 % — ABNORMAL LOW (ref 36.0–46.0)
Hemoglobin: 9 g/dL — ABNORMAL LOW (ref 12.0–15.0)
Hemoglobin: 11.2 g/dL — ABNORMAL LOW (ref 12.0–15.0)
MCH: 20.5 pg — ABNORMAL LOW (ref 26.0–34.0)
MCH: 20.6 pg — ABNORMAL LOW (ref 26.0–34.0)
MCHC: 30.7 g/dL (ref 30.0–36.0)
MCHC: 31.5 g/dL (ref 30.0–36.0)
MCV: 66.7 fL — ABNORMAL LOW (ref 80.0–100.0)
MCV: 65.4 fL — ABNORMAL LOW (ref 80.0–100.0)
Platelets: 268 10*3/uL (ref 150–400)
Platelets: 235 10*3/uL (ref 150–400)
RBC: 4.39 MIL/uL (ref 3.87–5.11)
RBC: 5.43 MIL/uL — ABNORMAL HIGH (ref 3.87–5.11)
RDW: 19.5 % — ABNORMAL HIGH (ref 11.5–15.5)
RDW: 20.7 % — ABNORMAL HIGH (ref 11.5–15.5)
WBC: 7.9 10*3/uL (ref 4.0–10.5)
WBC: 6 10*3/uL (ref 4.0–10.5)
nRBC: 0 % (ref 0.0–0.2)
nRBC: 0 % (ref 0.0–0.2)

## 2020-05-14 LAB — HEPARIN LEVEL (UNFRACTIONATED): Heparin Unfractionated: 1.36 IU/mL — ABNORMAL HIGH (ref 0.30–0.70)

## 2020-05-14 LAB — GLUCOSE, CAPILLARY
Glucose-Capillary: 92 mg/dL (ref 70–99)
Glucose-Capillary: 73 mg/dL (ref 70–99)
Glucose-Capillary: 87 mg/dL (ref 70–99)
Glucose-Capillary: 102 mg/dL — ABNORMAL HIGH (ref 70–99)

## 2020-05-14 LAB — POCT ACTIVATED CLOTTING TIME: Activated Clotting Time: 154 seconds

## 2020-05-14 LAB — APTT: aPTT: 54 seconds — ABNORMAL HIGH (ref 24–36)

## 2020-05-14 SURGERY — LEFT HEART CATH AND CORONARY ANGIOGRAPHY
Anesthesia: LOCAL

## 2020-05-14 MED ORDER — POTASSIUM CHLORIDE 10 MEQ/100ML IV SOLN
10.0000 meq | INTRAVENOUS | Status: AC
  Administered 2020-05-14 (×4): 10 meq via INTRAVENOUS
  Filled 2020-05-14 (×4): qty 100

## 2020-05-14 MED ORDER — ONDANSETRON HCL 4 MG/2ML IJ SOLN
4.0000 mg | Freq: Four times a day (QID) | INTRAMUSCULAR | Status: DC | PRN
  Administered 2020-05-15: 4 mg via INTRAVENOUS
  Filled 2020-05-14 (×2): qty 2

## 2020-05-14 MED ORDER — SODIUM CHLORIDE 0.9 % IV SOLN: 250.0000 mL | INTRAVENOUS | Status: DC | PRN

## 2020-05-14 MED ORDER — HEPARIN (PORCINE) IN NACL 1000-0.9 UT/500ML-% IV SOLN
INTRAVENOUS | Status: AC
  Filled 2020-05-14: qty 1000

## 2020-05-14 MED ORDER — ENOXAPARIN SODIUM 40 MG/0.4ML ~~LOC~~ SOLN
40.0000 mg | SUBCUTANEOUS | Status: DC
  Administered 2020-05-15 – 2020-05-19 (×5): 40 mg via SUBCUTANEOUS
  Filled 2020-05-14 (×5): qty 0.4

## 2020-05-14 MED ORDER — SODIUM CHLORIDE 0.9 % IV SOLN: INTRAVENOUS | Status: AC

## 2020-05-14 MED ORDER — HEPARIN (PORCINE) IN NACL 1000-0.9 UT/500ML-% IV SOLN
INTRAVENOUS | Status: DC | PRN
  Administered 2020-05-14 (×2): 500 mL

## 2020-05-14 MED ORDER — FENTANYL CITRATE (PF) 100 MCG/2ML IJ SOLN
INTRAMUSCULAR | Status: AC
  Filled 2020-05-14: qty 2

## 2020-05-14 MED ORDER — IOHEXOL 350 MG/ML SOLN
INTRAVENOUS | Status: DC | PRN
  Administered 2020-05-14: 85 mL

## 2020-05-14 MED ORDER — LIDOCAINE HCL (PF) 1 % IJ SOLN
INTRAMUSCULAR | Status: AC
  Filled 2020-05-14: qty 30

## 2020-05-14 MED ORDER — CHLORHEXIDINE GLUCONATE CLOTH 2 % EX PADS
6.0000 | MEDICATED_PAD | Freq: Every day | CUTANEOUS | Status: DC
  Administered 2020-05-14 – 2020-05-16 (×2): 6 via TOPICAL

## 2020-05-14 MED ORDER — FENTANYL CITRATE (PF) 100 MCG/2ML IJ SOLN
INTRAMUSCULAR | Status: DC | PRN
  Administered 2020-05-14: 50 ug via INTRAVENOUS

## 2020-05-14 MED ORDER — SODIUM CHLORIDE 0.9% FLUSH: 3.0000 mL | INTRAVENOUS | Status: DC | PRN

## 2020-05-14 MED ORDER — HEPARIN SODIUM (PORCINE) 1000 UNIT/ML IJ SOLN
INTRAMUSCULAR | Status: AC
  Filled 2020-05-14: qty 1

## 2020-05-14 MED ORDER — ACETAMINOPHEN 325 MG PO TABS
650.0000 mg | ORAL_TABLET | ORAL | Status: DC | PRN
  Administered 2020-05-14 – 2020-05-19 (×6): 650 mg via ORAL
  Filled 2020-05-14 (×6): qty 2

## 2020-05-14 MED ORDER — LIDOCAINE HCL (PF) 1 % IJ SOLN
INTRAMUSCULAR | Status: DC | PRN
  Administered 2020-05-14: 20 mL

## 2020-05-14 MED ORDER — LABETALOL HCL 5 MG/ML IV SOLN: 10.0000 mg | INTRAVENOUS | Status: AC | PRN

## 2020-05-14 MED ORDER — OXYCODONE HCL 5 MG PO TABS: 5.0000 mg | ORAL_TABLET | ORAL | Status: DC | PRN

## 2020-05-14 MED ORDER — SODIUM CHLORIDE 0.9% FLUSH
3.0000 mL | Freq: Two times a day (BID) | INTRAVENOUS | Status: DC
  Administered 2020-05-14 – 2020-05-19 (×11): 3 mL via INTRAVENOUS

## 2020-05-14 MED ORDER — VERAPAMIL HCL 2.5 MG/ML IV SOLN
INTRAVENOUS | Status: AC
  Filled 2020-05-14: qty 2

## 2020-05-14 MED ORDER — MIDAZOLAM HCL 2 MG/2ML IJ SOLN
INTRAMUSCULAR | Status: DC | PRN
  Administered 2020-05-14: 1 mg via INTRAVENOUS

## 2020-05-14 MED ORDER — HYDRALAZINE HCL 20 MG/ML IJ SOLN: 10.0000 mg | INTRAMUSCULAR | Status: AC | PRN

## 2020-05-14 MED ORDER — MIDAZOLAM HCL 2 MG/2ML IJ SOLN
INTRAMUSCULAR | Status: AC
  Filled 2020-05-14: qty 2

## 2020-05-14 SURGICAL SUPPLY — 13 items
BAG SNAP BAND KOVER 36X36 (MISCELLANEOUS) ×2 IMPLANT
CATH INFINITI 5FR MULTPACK ANG (CATHETERS) ×2 IMPLANT
COVER DOME SNAP 22 D (MISCELLANEOUS) ×2 IMPLANT
GLIDESHEATH SLEND A-KIT 6F 22G (SHEATH) ×2 IMPLANT
GUIDEWIRE INQWIRE 1.5J.035X260 (WIRE) ×1 IMPLANT
INQWIRE 1.5J .035X260CM (WIRE) ×2
KIT HEART LEFT (KITS) ×2 IMPLANT
PACK CARDIAC CATHETERIZATION (CUSTOM PROCEDURE TRAY) ×2 IMPLANT
SHEATH PINNACLE 5F 10CM (SHEATH) ×2 IMPLANT
SHEATH PROBE COVER 6X72 (BAG) ×2 IMPLANT
TRANSDUCER W/STOPCOCK (MISCELLANEOUS) ×2 IMPLANT
TUBING CIL FLEX 10 FLL-RA (TUBING) ×2 IMPLANT
WIRE EMERALD 3MM-J .035X150CM (WIRE) ×2 IMPLANT

## 2020-05-14 NOTE — Interval H&P Note (Signed)
Cath Lab Visit (complete for each Cath Lab visit)  Clinical Evaluation Leading to the Procedure:   ACS: Yes.    Non-ACS:    Anginal Classification: CCS III  Anti-ischemic medical therapy: No Therapy  Non-Invasive Test Results: No non-invasive testing performed  Prior CABG: No previous CABG      History and Physical Interval Note:  05/14/2020 9:23 AM  Monique Gomez  has presented today for surgery, with the diagnosis of nstemi.  The various methods of treatment have been discussed with the patient and family. After consideration of risks, benefits and other options for treatment, the patient has consented to  Procedure(s): LEFT HEART CATH AND CORONARY ANGIOGRAPHY (N/A) as a surgical intervention.  The patient's history has been reviewed, patient examined, no change in status, stable for surgery.  I have reviewed the patient's chart and labs.  Questions were answered to the patient's satisfaction.     Belva Crome III

## 2020-05-14 NOTE — CV Procedure (Addendum)
   Normal coronary arteries  Anterolateral wall motion abnormality.  EF 40 to 50%.  LVEDP 23 mmHg.  Suspect myopericarditis.  Her anticoagulants are on hold.  We will need to figure out management strategy for Eliquis and resume when clinically appropriate.

## 2020-05-14 NOTE — Progress Notes (Signed)
[place] for Heparin Indication: chest pain/ACS, afib  Allergies  Allergen Reactions  . Venlafaxine Nausea And Vomiting and Other (See Comments)    Dizziness, shakiness    Patient Measurements: Height: 5\' 2"  (157.5 cm) Weight: 124.7 kg (275 lb) IBW/kg (Calculated) : 50.1 HEPARIN DW (KG): 81.3  Vital Signs: Temp: 98.8 F (37.1 C) (01/12 1946) Temp Source: Oral (01/12 1946) BP: 96/78 (01/13 0100) Pulse Rate: 103 (01/13 0100)  Labs: Recent Labs    05/13/20 1005 05/13/20 1221 05/13/20 1805 05/14/20 0052  HGB 10.3*  --   --  9.0*  HCT 34.4*  --   --  29.3*  PLT 312  --   --  268  APTT 32  --  42* 54*  LABPROT 16.3*  --   --   --   INR 1.4*  --   --   --   HEPARINUNFRC >2.20*  --   --  1.36*  CREATININE 1.39*  --   --  1.14*  TROPONINIHS >27,000* >27,000*  --   --     Estimated Creatinine Clearance: 63.7 mL/min (A) (by C-G formula based on SCr of 1.14 mg/dL (H)).   Medical History: Past Medical History:  Diagnosis Date  . Abdominal fibromatosis   . Allergic rhinitis   . Anemia   . Asthma   . Essential hypertension   . GERD (gastroesophageal reflux disease)   . Headache(784.0)   . History of cardiac catheterization    No significant CAD May 2015  . History of migraine headaches   . Hyperlipidemia   . Migraines   . Obesity   . PAF (paroxysmal atrial fibrillation) ([place]) 08/2013  . Sleep apnea   . Type 2 diabetes mellitus (HCC)     Medications:  See med rec  Assessment: 65 yo female presented with chest pain. On chronic anticoagulation with eliquis and last dose was 1/11 at 2130.   Will start heparin and follow APTT as well as HL until they correlated.   Plan for cardiac cath in AM. APTT tonight came back subtherapeutic at 42, on 1000 units/hr. No s/sx of bleeding or infusion issues. Trop >27k.   1/13 AM update:  APTT remains low  Goal of Therapy:  Heparin level 0.3-0.7 units/ml aPTT 66-102 seconds Monitor  platelets by anticoagulation protocol: Yes   Plan:  Increase heparin infusion to 1400 units/hr Check aPTT and anti-Xa level in ~6-8 hours and daily while on heparin Continue to monitor H&H and platelets  Narda Bonds, PharmD, BCPS Clinical Pharmacist Phone: [phone]

## 2020-05-14 NOTE — Progress Notes (Signed)
Removal of Right femoral arterial sheath without complication for total hold time of 25 miunites. Bp: 110 / 73, HR: 105, SP02: 98%. Patient given post recovery instruction. Right groin stable, no oozing , brusing or hematoma present.

## 2020-05-14 NOTE — Progress Notes (Signed)
Progress Note  Patient Name: Monique Gomez Date of Encounter: 05/14/2020  [facility] Cardiologist: Rozann Lesches, MD   Subjective   Feels ok this AM. No chest pain  Inpatient Medications    Scheduled Meds: . aspirin EC  81 mg Oral Daily  . atorvastatin  80 mg Oral q1800  . Chlorhexidine Gluconate Cloth  6 each Topical Daily  . colchicine  0.6 mg Oral Daily  . [START ON 05/15/2020] enoxaparin (LOVENOX) injection  40 mg Subcutaneous Q24H  . fluticasone  2 spray Each Nare Daily  . insulin aspart  0-15 Units Subcutaneous TID WC  . ipratropium  2 spray Each Nare Q12H  . pantoprazole  40 mg Oral BID  . sodium chloride flush  3 mL Intravenous Q12H  . sodium chloride flush  3 mL Intravenous Q12H  . topiramate  75 mg Oral QHS   Continuous Infusions: . sodium chloride    . sodium chloride     PRN Meds: sodium chloride, acetaminophen, nitroGLYCERIN, ondansetron (ZOFRAN) IV, oxyCODONE, sodium chloride flush   Vital Signs    Vitals:   05/14/20 1600 05/14/20 1630 05/14/20 1700 05/14/20 2000  BP: 104/82  (!) 111/92 (!) 88/73  Pulse: (!) 116 (!) 111 (!) 115 (!) 107  Resp: 17 (!) 25 19 20   Temp:    98.1 F (36.7 C)  TempSrc:    Oral  SpO2: 98% 97% 99% 99%  Weight:      Height:        Intake/Output Summary (Last 24 hours) at 05/14/2020 2049 Last data filed at 05/14/2020 2000 Gross per 24 hour  Intake 1516.05 ml  Output 1150 ml  Net 366.05 ml   Last 3 Weights 05/14/2020 05/13/2020 04/16/2020  Weight (lbs) 276 lb 0.3 oz 275 lb 280 lb  Weight (kg) 125.2 kg 124.739 kg 127.007 kg      Telemetry    Sinus tach, NSR - Personally Reviewed  ECG      Physical Exam   GEN: No acute distress.   Neck: No JVD Cardiac: RRR, no murmurs, rubs, or gallops.  Respiratory: Clear to auscultation bilaterally. GI: Soft, nontender, non-distended  MS: No edema; No deformity. Neuro:  Nonfocal  Psych: Normal affect   Labs    High Sensitivity Troponin:   Recent Labs  Lab  05/13/20 1005 05/13/20 1221  TROPONINIHS >27,000* >27,000*      Chemistry Recent Labs  Lab 05/13/20 1005 05/14/20 0052 05/14/20 0741  NA 134* 136 137  K 3.0* 3.3* 4.4  CL 98 104 107  CO2 23 21* 19*  GLUCOSE 143* 103* 99  BUN 24* 21 20  CREATININE 1.39* 1.14* 0.99  CALCIUM 9.0 8.8* 8.7*  PROT 7.9  --   --   ALBUMIN 3.4*  --   --   AST 166*  --   --   ALT 41  --   --   ALKPHOS 59  --   --   BILITOT 0.6  --   --   GFRNONAA 43* 54* >60  ANIONGAP 13 11 11      Hematology Recent Labs  Lab 05/13/20 1005 05/14/20 0052 05/14/20 0741  WBC 14.5* 7.9 6.0  RBC 5.25* 4.39 5.43*  HGB 10.3* 9.0* 11.2*  HCT 34.4* 29.3* 35.5*  MCV 65.5* 66.7* 65.4*  MCH 19.6* 20.5* 20.6*  MCHC 29.9* 30.7 31.5  RDW 19.3* 19.5* 20.7*  PLT 312 268 235    BNP Recent Labs  Lab 05/13/20 1005  BNP 1,175.0*  DDimer No results for input(s): DDIMER in the last 168 hours.   Radiology    CT Angio Chest PE W/Cm &/Or Wo Cm  Result Date: 05/13/2020 CLINICAL DATA:  Nausea, vomiting, weakness, and fatigue since Thursday, chest pressure, back pain and diarrhea since last night, fever to 100.5 degrees Thursday, history hypertension, GERD, type II diabetes mellitus, abdominal fibromatosis, atrial fibrillation, former smoker EXAM: CT ANGIOGRAPHY CHEST WITH CONTRAST TECHNIQUE: Multidetector CT imaging of the chest was performed using the standard protocol during bolus administration of intravenous contrast. Multiplanar CT image reconstructions and MIPs were obtained to evaluate the vascular anatomy. CONTRAST:  38mL OMNIPAQUE IOHEXOL 350 MG/ML SOLN IV COMPARISON:  None FINDINGS: Cardiovascular: Atherosclerotic calcifications aorta and coronary arteries. Minimal enlargement of heart with thickening of LV wall and septum consistent with LEFT ventricular hypertrophy. Minimal pericardial fluid. Pulmonary arteries adequately opacified and patent. No evidence of pulmonary embolism. Mediastinum/Nodes: Small hiatal  hernia. Esophagus unremarkable. Base of cervical region normal appearance. No thoracic adenopathy. Few normal sized mediastinal and axillary lymph nodes noted. Lungs/Pleura: Minimal subsegmental atelectasis in the lungs. Lungs otherwise clear. No pulmonary infiltrate, pleural effusion, or pneumothorax. No mass/nodule. Upper Abdomen: Visualized upper abdomen unremarkable Musculoskeletal: Unremarkable Review of the MIP images confirms the above findings. IMPRESSION: No evidence of pulmonary embolism. Minimal subsegmental atelectasis in the lungs. Small hiatal hernia. Aortic Atherosclerosis (ICD10-I70.0). Electronically Signed   By: Lavonia Dana M.D.   On: 05/13/2020 11:50   CARDIAC CATHETERIZATION  Result Date: 05/14/2020  Widely patent/normal widely patent coronary arteries.  Right dominant anatomy  Regional wall motion abnormality noted on left ventriculography. EF 40 to 50%. RECOMMENDATIONS:  Given context of clinical presentation, myopericarditis is now the leading diagnosis in the differential. This presentation is not atypical stress cardiomyopathy. Recommend MRI/cardiac.  DG Chest Port 1 View  Result Date: 05/13/2020 CLINICAL DATA:  Pain and fatigue.  Recent fever EXAM: PORTABLE CHEST 1 VIEW COMPARISON:  February 12, 2019 FINDINGS: There is no edema or airspace opacity. Heart is mildly enlarged with pulmonary vascular normal. There is prominent epicardial fat along the left heart border. No adenopathy. No pneumothorax. No bone lesions. IMPRESSION: Stable cardiac prominence.  No edema or airspace opacity. Electronically Signed   By: Lowella Grip III M.D.   On: 05/13/2020 10:45   ECHOCARDIOGRAM COMPLETE  Result Date: 05/13/2020    ECHOCARDIOGRAM REPORT   Patient Name:   Monique Gomez [facility] Date of Exam: 05/13/2020 Medical Rec #:  [id number]         Height:       62.0 in Accession #:    [id number]        Weight:       275.0 lb Date of Birth:  1956-02-28         BSA:          2.189 m Patient Age:     65 years          BP:           113/90 mmHg Patient Gender: F                 HR:           111 bpm. Exam Location:  [facility] Procedure: 2D Echo STAT ECHO Indications:    Dyspnea R06.00  History:        Patient has prior history of Echocardiogram examinations, most                 recent 03/14/2019. Previous  Myocardial Infarction,                 Arrythmias:Atrial Fibrillation; Risk Factors:Dyslipidemia and                 Former Smoker.  Sonographer:    Leavy Cella RDCS (AE) Referring Phys: [id number] [place]  1. Left ventricular ejection fraction, by estimation, is 50%. The left ventricle has normal function. Left ventricular endocardial border not optimally defined to evaluate regional wall motion. There is moderate left ventricular hypertrophy. Left ventricular diastolic parameters are indeterminate.  2. Right ventricular systolic function is normal. The right ventricular size is normal.  3. There is indentation of the RA without collapse, no RV collapse. Borderline MV respiratory variation of 20%. Normal IVC. Overall findings are not consistent with tamponade physiology. Would repeat study 48 hours. Marland Kitchen a small pericardial effusion is present. The pericardial effusion is circumferential. There is no evidence of cardiac tamponade.  4. The mitral valve is normal in structure. No evidence of mitral valve regurgitation. No evidence of mitral stenosis.  5. The aortic valve has an indeterminant number of cusps. Aortic valve regurgitation is not visualized. No aortic stenosis is present.  6. The inferior vena cava is normal in size with greater than 50% respiratory variability, suggesting right atrial pressure of 3 mmHg. FINDINGS  Left Ventricle: Left ventricular ejection fraction, by estimation, is 50%. The left ventricle has normal function. Left ventricular endocardial border not optimally defined to evaluate regional wall motion. The left ventricular internal cavity size was normal in size.  There is moderate left ventricular hypertrophy. Left ventricular diastolic parameters are indeterminate. Right Ventricle: The right ventricular size is normal. No increase in right ventricular wall thickness. Right ventricular systolic function is normal. Left Atrium: Left atrial size was normal in size. Right Atrium: Right atrial size was normal in size. Pericardium: There is indentation of the RA without collapse, no RV collapse. Borderline MV respiratory variation of 20%. Normal IVC. Overall findings are not consistent with tamponade physiology. Would repeat study 48 hours. A small pericardial effusion  is present. The pericardial effusion is circumferential. There is no evidence of cardiac tamponade. Mitral Valve: The mitral valve is normal in structure. No evidence of mitral valve regurgitation. No evidence of mitral valve stenosis. Tricuspid Valve: The tricuspid valve is normal in structure. Tricuspid valve regurgitation is trivial. No evidence of tricuspid stenosis. Aortic Valve: The aortic valve has an indeterminant number of cusps. Aortic valve regurgitation is not visualized. No aortic stenosis is present. Aortic valve mean gradient measures 6.6 mmHg. Aortic valve peak gradient measures 12.5 mmHg. Pulmonic Valve: The pulmonic valve was not well visualized. Pulmonic valve regurgitation is not visualized. No evidence of pulmonic stenosis. Aorta: The aortic root is normal in size and structure. Venous: The inferior vena cava is normal in size with greater than 50% respiratory variability, suggesting right atrial pressure of 3 mmHg. IAS/Shunts: No atrial level shunt detected by color flow Doppler.  LEFT VENTRICLE PLAX 2D LVIDd:         2.78 cm Diastology LVIDs:         1.97 cm LV e' medial:   4.82 cm/s LV PW:         1.94 cm LV E/e' medial: 29.0 LV IVS:        1.63 cm  RIGHT VENTRICLE RV S prime:     17.00 cm/s TAPSE (M-mode): 2.1 cm LEFT ATRIUM  Index       RIGHT ATRIUM           Index LA diam:         3.90 cm 1.78 cm/m  RA Area:     12.80 cm LA Vol (A2C):   52.0 ml 23.76 ml/m RA Volume:   34.60 ml  15.81 ml/m LA Vol (A4C):   54.5 ml 24.90 ml/m LA Biplane Vol: 53.0 ml 24.21 ml/m  AORTIC VALVE AV Vmax:           176.46 cm/s AV Vmean:          122.540 cm/s AV VTI:            0.233 m AV Peak Grad:      12.5 mmHg AV Mean Grad:      6.6 mmHg LVOT Vmax:         168.45 cm/s LVOT Vmean:        95.985 cm/s LVOT VTI:          0.183 m LVOT/AV VTI ratio: 0.78  AORTA Ao Root diam: 2.50 cm MITRAL VALVE                TRICUSPID VALVE MV Area (PHT): 7.82 cm     TR Peak grad:   26.4 mmHg MV Decel Time: 97 msec      TR Vmax:        257.00 cm/s MV E velocity: 140.00 cm/s MV A velocity: 52.90 cm/s   SHUNTS MV E/A ratio:  2.65         Systemic VTI: 0.18 m Carlyle Dolly MD Electronically signed by Carlyle Dolly MD Signature Date/Time: 05/13/2020/1:04:08 PM    Final     Cardiac Studies   I personally reviewed the cath films  Patient Profile     65 y.o. female with no CAD.  Elevated troponin likely due to myopericarditis.  Assessment & Plan    Plan cardiac MRI.    Resume Eliquis tomorrow.  Start beta blocker given likely myocarditis, as BP allows.  She has had soe borderline BPs.  HR will tolerate.  Moderately elevated LVEDP, so will have to be careful about giving too much fluid.   OK to transfer to tele when BP stabilizes.   For questions or updates, please contact [place] Please consult [email or web address] for contact info under        Signed, Larae Grooms, MD  05/14/2020, 8:49 PM

## 2020-05-15 ENCOUNTER — Inpatient Hospital Stay (HOSPITAL_COMMUNITY): Payer: BC Managed Care – PPO

## 2020-05-15 DIAGNOSIS — I409 Acute myocarditis, unspecified: Secondary | ICD-10-CM | POA: Diagnosis not present

## 2020-05-15 DIAGNOSIS — I313 Pericardial effusion (noninflammatory): Secondary | ICD-10-CM

## 2020-05-15 DIAGNOSIS — D649 Anemia, unspecified: Secondary | ICD-10-CM | POA: Diagnosis not present

## 2020-05-15 DIAGNOSIS — E8770 Fluid overload, unspecified: Secondary | ICD-10-CM

## 2020-05-15 LAB — GLUCOSE, CAPILLARY
Glucose-Capillary: 106 mg/dL — ABNORMAL HIGH (ref 70–99)
Glucose-Capillary: 106 mg/dL — ABNORMAL HIGH (ref 70–99)
Glucose-Capillary: 98 mg/dL (ref 70–99)
Glucose-Capillary: 132 mg/dL — ABNORMAL HIGH (ref 70–99)

## 2020-05-15 LAB — BASIC METABOLIC PANEL
Anion gap: 10 (ref 5–15)
BUN: 15 mg/dL (ref 8–23)
CO2: 19 mmol/L — ABNORMAL LOW (ref 22–32)
Calcium: 8.5 mg/dL — ABNORMAL LOW (ref 8.9–10.3)
Chloride: 105 mmol/L (ref 98–111)
Creatinine, Ser: 0.92 mg/dL (ref 0.44–1.00)
GFR, Estimated: >60 mL/min (ref >60)
Glucose, Bld: 100 mg/dL — ABNORMAL HIGH (ref 70–99)
Potassium: 3.6 mmol/L (ref 3.5–5.1)
Sodium: 134 mmol/L — ABNORMAL LOW (ref 135–145)

## 2020-05-15 LAB — CBC
HCT: 26 % — ABNORMAL LOW (ref 36.0–46.0)
Hemoglobin: 8.1 g/dL — ABNORMAL LOW (ref 12.0–15.0)
MCH: 21 pg — ABNORMAL LOW (ref 26.0–34.0)
MCHC: 31.2 g/dL (ref 30.0–36.0)
MCV: 67.5 fL — ABNORMAL LOW (ref 80.0–100.0)
Platelets: 279 10*3/uL (ref 150–400)
RBC: 3.85 MIL/uL — ABNORMAL LOW (ref 3.87–5.11)
RDW: 19.9 % — ABNORMAL HIGH (ref 11.5–15.5)
WBC: 7.3 10*3/uL (ref 4.0–10.5)
nRBC: 0 % (ref 0.0–0.2)

## 2020-05-15 LAB — TRANSFERRIN: Transferrin: 243 mg/dL (ref 192–382)

## 2020-05-15 LAB — RETICULOCYTES
Immature Retic Fract: 25.3 % — ABNORMAL HIGH (ref 2.3–15.9)
RBC.: 4.5 MIL/uL (ref 3.87–5.11)
Retic Count, Absolute: 47.3 10*3/uL (ref 19.0–186.0)
Retic Ct Pct: 1.1 % (ref 0.4–3.1)

## 2020-05-15 LAB — IRON AND TIBC
Iron: 20 ug/dL — ABNORMAL LOW (ref 28–170)
Saturation Ratios: 6 % — ABNORMAL LOW (ref 10.4–31.8)
TIBC: 340 ug/dL (ref 250–450)
UIBC: 320 ug/dL

## 2020-05-15 LAB — FERRITIN: Ferritin: 54 ng/mL (ref 11–307)

## 2020-05-15 LAB — FOLATE: Folate: 17 ng/mL (ref >5.9)

## 2020-05-15 LAB — VITAMIN B12: Vitamin B-12: 401 pg/mL (ref 180–914)

## 2020-05-15 MED ORDER — GADOBUTROL 1 MMOL/ML IV SOLN
10.0000 mL | Freq: Once | INTRAVENOUS | Status: AC | PRN
  Administered 2020-05-15: 10 mL via INTRAVENOUS

## 2020-05-15 MED ORDER — METOPROLOL TARTRATE 12.5 MG HALF TABLET
12.5000 mg | ORAL_TABLET | Freq: Two times a day (BID) | ORAL | Status: DC
  Administered 2020-05-15 – 2020-05-18 (×6): 12.5 mg via ORAL
  Filled 2020-05-15 (×7): qty 1

## 2020-05-15 MED ORDER — FUROSEMIDE 10 MG/ML IJ SOLN
20.0000 mg | Freq: Once | INTRAMUSCULAR | Status: AC
  Administered 2020-05-15: 20 mg via INTRAVENOUS
  Filled 2020-05-15: qty 2

## 2020-05-15 MED FILL — Heparin Sodium (Porcine) Inj 1000 Unit/ML: INTRAMUSCULAR | Qty: 10 | Status: AC

## 2020-05-15 MED FILL — Verapamil HCl IV Soln 2.5 MG/ML: INTRAVENOUS | Qty: 2 | Status: AC

## 2020-05-15 NOTE — Progress Notes (Signed)
Progress Note  Patient Name: Monique Gomez Date of Encounter: 05/15/2020  [facility] Cardiologist: Rozann Lesches, MD   Subjective   No CP.  Feels very weak.  SHOB with any activity.    Inpatient Medications    Scheduled Meds: . aspirin EC  81 mg Oral Daily  . atorvastatin  80 mg Oral q1800  . Chlorhexidine Gluconate Cloth  6 each Topical Daily  . colchicine  0.6 mg Oral Daily  . enoxaparin (LOVENOX) injection  40 mg Subcutaneous Q24H  . fluticasone  2 spray Each Nare Daily  . insulin aspart  0-15 Units Subcutaneous TID WC  . ipratropium  2 spray Each Nare Q12H  . metoprolol tartrate  12.5 mg Oral BID  . pantoprazole  40 mg Oral BID  . sodium chloride flush  3 mL Intravenous Q12H  . sodium chloride flush  3 mL Intravenous Q12H  . topiramate  75 mg Oral QHS   Continuous Infusions: . sodium chloride    . sodium chloride     PRN Meds: sodium chloride, acetaminophen, nitroGLYCERIN, ondansetron (ZOFRAN) IV, oxyCODONE, sodium chloride flush   Vital Signs    Vitals:   05/15/20 0500 05/15/20 0600 05/15/20 0700 05/15/20 0818  BP: 94/68 104/69 93/74   Pulse: (!) 119 (!) 103 95   Resp: (!) 26 16 17    Temp:    98.7 F (37.1 C)  TempSrc:    Oral  SpO2: 97% 93% 93%   Weight:      Height:        Intake/Output Summary (Last 24 hours) at 05/15/2020 0916 Last data filed at 05/15/2020 0600 Gross per 24 hour  Intake 1198.92 ml  Output 1000 ml  Net 198.92 ml   Last 3 Weights 05/15/2020 05/14/2020 05/13/2020  Weight (lbs) 288 lb 12.8 oz 276 lb 0.3 oz 275 lb  Weight (kg) 131 kg 125.2 kg 124.739 kg      Telemetry    Sinus tach - Personally Reviewed  ECG      Physical Exam   GEN: No acute distress.   Neck: No JVD Cardiac: RRR, no murmurs, rubs, or gallops.  Respiratory: Clear to auscultation bilaterally. GI: Soft, nontender, non-distended  MS: No edema; No deformity.  Obese, no right groin hematoma Neuro:  Nonfocal  Psych: Normal affect   Labs    High  Sensitivity Troponin:   Recent Labs  Lab 05/13/20 1005 05/13/20 1221  TROPONINIHS >27,000* >27,000*      Chemistry Recent Labs  Lab 05/13/20 1005 05/14/20 0052 05/14/20 0741  NA 134* 136 137  K 3.0* 3.3* 4.4  CL 98 104 107  CO2 23 21* 19*  GLUCOSE 143* 103* 99  BUN 24* 21 20  CREATININE 1.39* 1.14* 0.99  CALCIUM 9.0 8.8* 8.7*  PROT 7.9  --   --   ALBUMIN 3.4*  --   --   AST 166*  --   --   ALT 41  --   --   ALKPHOS 59  --   --   BILITOT 0.6  --   --   GFRNONAA 43* 54* >60  ANIONGAP 13 11 11      Hematology Recent Labs  Lab 05/14/20 0052 05/14/20 0741 05/15/20 0456  WBC 7.9 6.0 7.3  RBC 4.39 5.43* 3.85*  HGB 9.0* 11.2* 8.1*  HCT 29.3* 35.5* 26.0*  MCV 66.7* 65.4* 67.5*  MCH 20.5* 20.6* 21.0*  MCHC 30.7 31.5 31.2  RDW 19.5* 20.7* 19.9*  PLT 268  235 279    BNP Recent Labs  Lab 05/13/20 1005  BNP 1,175.0*     DDimer No results for input(s): DDIMER in the last 168 hours.   Radiology    CT Angio Chest PE W/Cm &/Or Wo Cm  Result Date: 05/13/2020 CLINICAL DATA:  Nausea, vomiting, weakness, and fatigue since Thursday, chest pressure, back pain and diarrhea since last night, fever to 100.5 degrees Thursday, history hypertension, GERD, type II diabetes mellitus, abdominal fibromatosis, atrial fibrillation, former smoker EXAM: CT ANGIOGRAPHY CHEST WITH CONTRAST TECHNIQUE: Multidetector CT imaging of the chest was performed using the standard protocol during bolus administration of intravenous contrast. Multiplanar CT image reconstructions and MIPs were obtained to evaluate the vascular anatomy. CONTRAST:  65mL OMNIPAQUE IOHEXOL 350 MG/ML SOLN IV COMPARISON:  None FINDINGS: Cardiovascular: Atherosclerotic calcifications aorta and coronary arteries. Minimal enlargement of heart with thickening of LV wall and septum consistent with LEFT ventricular hypertrophy. Minimal pericardial fluid. Pulmonary arteries adequately opacified and patent. No evidence of pulmonary  embolism. Mediastinum/Nodes: Small hiatal hernia. Esophagus unremarkable. Base of cervical region normal appearance. No thoracic adenopathy. Few normal sized mediastinal and axillary lymph nodes noted. Lungs/Pleura: Minimal subsegmental atelectasis in the lungs. Lungs otherwise clear. No pulmonary infiltrate, pleural effusion, or pneumothorax. No mass/nodule. Upper Abdomen: Visualized upper abdomen unremarkable Musculoskeletal: Unremarkable Review of the MIP images confirms the above findings. IMPRESSION: No evidence of pulmonary embolism. Minimal subsegmental atelectasis in the lungs. Small hiatal hernia. Aortic Atherosclerosis (ICD10-I70.0). Electronically Signed   By: Lavonia Dana M.D.   On: 05/13/2020 11:50   CARDIAC CATHETERIZATION  Result Date: 05/14/2020  Widely patent/normal widely patent coronary arteries.  Right dominant anatomy  Regional wall motion abnormality noted on left ventriculography. EF 40 to 50%. RECOMMENDATIONS:  Given context of clinical presentation, myopericarditis is now the leading diagnosis in the differential. This presentation is not atypical stress cardiomyopathy. Recommend MRI/cardiac.  DG Chest Port 1 View  Result Date: 05/13/2020 CLINICAL DATA:  Pain and fatigue.  Recent fever EXAM: PORTABLE CHEST 1 VIEW COMPARISON:  February 12, 2019 FINDINGS: There is no edema or airspace opacity. Heart is mildly enlarged with pulmonary vascular normal. There is prominent epicardial fat along the left heart border. No adenopathy. No pneumothorax. No bone lesions. IMPRESSION: Stable cardiac prominence.  No edema or airspace opacity. Electronically Signed   By: Lowella Grip III M.D.   On: 05/13/2020 10:45   ECHOCARDIOGRAM COMPLETE  Result Date: 05/13/2020    ECHOCARDIOGRAM REPORT   Patient Name:   Monique Gomez [facility] Date of Exam: 05/13/2020 Medical Rec #:  [id number]         Height:       62.0 in Accession #:    [id number]        Weight:       275.0 lb Date of Birth:  05-Aug-1955          BSA:          2.189 m Patient Age:    65 years          BP:           113/90 mmHg Patient Gender: F                 HR:           111 bpm. Exam Location:  [facility] Procedure: 2D Echo STAT ECHO Indications:    Dyspnea R06.00  History:        Patient has prior history of Echocardiogram examinations, most  recent 03/14/2019. Previous Myocardial Infarction,                 Arrythmias:Atrial Fibrillation; Risk Factors:Dyslipidemia and                 Former Smoker.  Sonographer:    Leavy Cella RDCS (AE) Referring Phys: [id number] [place]  1. Left ventricular ejection fraction, by estimation, is 50%. The left ventricle has normal function. Left ventricular endocardial border not optimally defined to evaluate regional wall motion. There is moderate left ventricular hypertrophy. Left ventricular diastolic parameters are indeterminate.  2. Right ventricular systolic function is normal. The right ventricular size is normal.  3. There is indentation of the RA without collapse, no RV collapse. Borderline MV respiratory variation of 20%. Normal IVC. Overall findings are not consistent with tamponade physiology. Would repeat study 48 hours. Marland Kitchen a small pericardial effusion is present. The pericardial effusion is circumferential. There is no evidence of cardiac tamponade.  4. The mitral valve is normal in structure. No evidence of mitral valve regurgitation. No evidence of mitral stenosis.  5. The aortic valve has an indeterminant number of cusps. Aortic valve regurgitation is not visualized. No aortic stenosis is present.  6. The inferior vena cava is normal in size with greater than 50% respiratory variability, suggesting right atrial pressure of 3 mmHg. FINDINGS  Left Ventricle: Left ventricular ejection fraction, by estimation, is 50%. The left ventricle has normal function. Left ventricular endocardial border not optimally defined to evaluate regional wall motion. The left ventricular  internal cavity size was normal in size. There is moderate left ventricular hypertrophy. Left ventricular diastolic parameters are indeterminate. Right Ventricle: The right ventricular size is normal. No increase in right ventricular wall thickness. Right ventricular systolic function is normal. Left Atrium: Left atrial size was normal in size. Right Atrium: Right atrial size was normal in size. Pericardium: There is indentation of the RA without collapse, no RV collapse. Borderline MV respiratory variation of 20%. Normal IVC. Overall findings are not consistent with tamponade physiology. Would repeat study 48 hours. A small pericardial effusion  is present. The pericardial effusion is circumferential. There is no evidence of cardiac tamponade. Mitral Valve: The mitral valve is normal in structure. No evidence of mitral valve regurgitation. No evidence of mitral valve stenosis. Tricuspid Valve: The tricuspid valve is normal in structure. Tricuspid valve regurgitation is trivial. No evidence of tricuspid stenosis. Aortic Valve: The aortic valve has an indeterminant number of cusps. Aortic valve regurgitation is not visualized. No aortic stenosis is present. Aortic valve mean gradient measures 6.6 mmHg. Aortic valve peak gradient measures 12.5 mmHg. Pulmonic Valve: The pulmonic valve was not well visualized. Pulmonic valve regurgitation is not visualized. No evidence of pulmonic stenosis. Aorta: The aortic root is normal in size and structure. Venous: The inferior vena cava is normal in size with greater than 50% respiratory variability, suggesting right atrial pressure of 3 mmHg. IAS/Shunts: No atrial level shunt detected by color flow Doppler.  LEFT VENTRICLE PLAX 2D LVIDd:         2.78 cm Diastology LVIDs:         1.97 cm LV e' medial:   4.82 cm/s LV PW:         1.94 cm LV E/e' medial: 29.0 LV IVS:        1.63 cm  RIGHT VENTRICLE RV S prime:     17.00 cm/s TAPSE (M-mode): 2.1 cm LEFT ATRIUM  Index        RIGHT ATRIUM           Index LA diam:        3.90 cm 1.78 cm/m  RA Area:     12.80 cm LA Vol (A2C):   52.0 ml 23.76 ml/m RA Volume:   34.60 ml  15.81 ml/m LA Vol (A4C):   54.5 ml 24.90 ml/m LA Biplane Vol: 53.0 ml 24.21 ml/m  AORTIC VALVE AV Vmax:           176.46 cm/s AV Vmean:          122.540 cm/s AV VTI:            0.233 m AV Peak Grad:      12.5 mmHg AV Mean Grad:      6.6 mmHg LVOT Vmax:         168.45 cm/s LVOT Vmean:        95.985 cm/s LVOT VTI:          0.183 m LVOT/AV VTI ratio: 0.78  AORTA Ao Root diam: 2.50 cm MITRAL VALVE                TRICUSPID VALVE MV Area (PHT): 7.82 cm     TR Peak grad:   26.4 mmHg MV Decel Time: 97 msec      TR Vmax:        257.00 cm/s MV E velocity: 140.00 cm/s MV A velocity: 52.90 cm/s   SHUNTS MV E/A ratio:  2.65         Systemic VTI: 0.18 m Carlyle Dolly MD Electronically signed by Carlyle Dolly MD Signature Date/Time: 05/13/2020/1:04:08 PM    Final     Cardiac Studies   Cath films personally reviewed  Patient Profile     65 y.o. female with likely myocarditis  Assessment & Plan    1) Start beta blocker.  BP better.  OK to transfer to telemetry.   2) Anemia: Follow Hbg.  May be in part dilutional.  She has had anemia for years.  Negative GI w/u.  Mentions iron deficiency.  WIll check iron studies and check with internal medicine if IV iron would be helpful to boost her Hgb.  THis is likely contributing to her DOE.   Volume overload.  Increased LVEDP.  WIll give 1 dose of IV lasix to see if this helps with breathing.   For questions or updates, please contact [place] Please consult [email or web address] for contact info under        Signed, Larae Grooms, MD  05/15/2020, 9:16 AM  \

## 2020-05-15 NOTE — Consult Note (Signed)
Medical Consultation   Monique Gomez  NFA:[id number]  DOB: 21-Jul-1955  DOA: 05/13/2020  PCP: Alycia Rossetti, MD   Outpatient Specialists: Aline Brochure - orthopedics; Delton Coombes - oncology; Domenic Polite - cardiology   Requesting physician: Irish Lack, cardiology  Reason for consultation: Presented with probable myopericarditis.  Hgb down to 8, ?related to anemia.  Iron studies are pending.  ?IV iron, SOB with minimal exertion.    History of Present Illness: Monique Gomez is an 65 y.o. female with h/o OSA; DM; afib on Eliquis; morbid obesity; HLD; and HTN who presented on 1/12 for NSTEMI.  She was admitted to CCU by the cardiology team.  She reports initially coming in for evaluation due to concern for COVID - lots of sick contacts plus fatigue, SOB, n/v.  She developed CP.  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  In further questioning, she has had progressive SOB for about the last 6-7 months.  She has also had chronic fatigue in that period, although it was worse starting a week ago.  Her evaluation showed a troponin <27k initially, but this is now thought to be related to a myopericarditis.  She denies personal or FH of thyroid disease or autoimmune disease.  She lost maybe 30 pounds in the last year and has not noticed improvement in the fatigue or SOB.  She is SOB with even minimal exertion.      Review of Systems:  ROS As per HPI otherwise 10 point review of systems negative.    Past Medical History: Past Medical History:  Diagnosis Date  . Abdominal fibromatosis   . Allergic rhinitis   . Anemia   . Asthma   . Essential hypertension   . GERD (gastroesophageal reflux disease)   . Headache(784.0)   . History of cardiac catheterization    No significant CAD May 2015  . History of migraine headaches   . Hyperlipidemia   . Migraines   . Obesity   . PAF (paroxysmal atrial fibrillation) ([place]) 08/2013  . Sleep apnea   . Type 2 diabetes mellitus  ([place])     Past Surgical History: Past Surgical History:  Procedure Laterality Date  . CATARACT EXTRACTION    . CATARACT EXTRACTION W/PHACO Right 04/05/2019   Procedure: CATARACT EXTRACTION PHACO AND INTRAOCULAR LENS PLACEMENT RIGHT EYE (CDE: 3.19);  Surgeon: Baruch Goldmann, MD;  Location: [facility];  Service: Ophthalmology;  Laterality: Right;  . COLONOSCOPY N/A 02/02/2015   SLF: 1. one colon polyp removed-no source for anemia identified. 2. moderate diverticulosis noted in the sigmoid colon and descending colon 3. the left colon is redundant 4. Rectal bleeding due ot small internal hemorroids 5. Moderate sized external hemorrhoids.  . ESOPHAGOGASTRODUODENOSCOPY N/A 02/02/2015   SLF: 1. 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 large hiatal hernia 3. mild non-erosive gastritis 4. No source for anemia identified.   Marland Kitchen LEFT HEART CATH AND CORONARY ANGIOGRAPHY N/A 05/14/2020   Procedure: LEFT HEART CATH AND CORONARY ANGIOGRAPHY;  Surgeon: Belva Crome, MD;  Location: [place] [facility];  Service: Cardiovascular;  Laterality: N/A;  . LEFT HEART CATHETERIZATION WITH CORONARY ANGIOGRAM N/A 09/03/2013   Procedure: LEFT HEART CATHETERIZATION WITH CORONARY ANGIOGRAM;  Surgeon: Jettie Booze, MD;  Location[facility] CATH LAB;  Service: Cardiovascular;  Laterality: N/A;  . Right carpal tunnel release       Allergies:   Allergies  Allergen  Reactions  . Venlafaxine Nausea And Vomiting and Other (See Comments)    Dizziness, shakiness     Social History:  reports that she quit smoking about 8 years ago. Her smoking use included cigarettes. She smoked 0.30 packs per day. She has never used smokeless tobacco. She reports current alcohol use. She reports that she does not use drugs.   Family History: Family History  Problem Relation Age of Onset  . Diabetes Mother   . Hypertension Mother   . Heart disease Mother   . High Cholesterol Mother   . Alzheimer's disease Father   . Hypertension  Sister   . Hypertension Brother   . Diabetes Brother   . Arthritis Sister   . Hypertension Sister   . High Cholesterol Sister   . Diabetes Son   . Developmental delay Son   . Colon cancer Neg Hx       Physical Exam: Vitals:   05/15/20 1000 05/15/20 1142 05/15/20 1200 05/15/20 1300  BP: 91/69  107/70   Pulse: (!) 109  (!) 108 (!) 116  Resp: (!) '22  17 16  ' Temp:  99.3 F (37.4 C)    TempSrc:  Oral    SpO2: 99%  (!) 89% 97%  Weight:      Height:        Constitutional: Alert and awake, oriented x3, not in any acute distress. Eyes:  EOMI, irises appear normal, anicteric sclera ENMT: external ears and nose appear normal, normal hearing, Lips appear normal, oropharynx mucosa, tongue appear normal  Neck: neck appears normal, no masses CVS: S1-S2 clear, mild tachycardia, no murmur rubs or gallops, no LE edema, normal pedal pulses  Respiratory:  clear to auscultation bilaterally, no wheezing, rales or rhonchi. Respiratory effort mildly increased. No accessory muscle use.  Abdomen: soft nontender, nondistended Musculoskeletal: : no cyanosis, clubbing or edema noted bilaterally Neuro: Cranial nerves II-XII grossly intact Psych: judgement and insight appear normal, stable mood and affect, mental status Skin: no rashes or lesions or ulcers, no induration or nodules; hemangioma along R face    Data reviewed:  I have personally reviewed the recent labs and imaging studies  Pertinent Labs:   Unremarkable CBC WBC 7.3 Hgb 8.1; 10.3 on 1/12 Troponin >27,000 on 1/12 BNP 1175 on 1/12 A1c 6.4 Normal TSH ESR 103 UA 30 protein Iron 20 Ferritin 54 Normal transferrin   Inpatient Medications:   Scheduled Meds: . aspirin EC  81 mg Oral Daily  . atorvastatin  80 mg Oral q1800  . Chlorhexidine Gluconate Cloth  6 each Topical Daily  . colchicine  0.6 mg Oral Daily  . enoxaparin (LOVENOX) injection  40 mg Subcutaneous Q24H  . fluticasone  2 spray Each Nare Daily  . insulin aspart   0-15 Units Subcutaneous TID WC  . ipratropium  2 spray Each Nare Q12H  . metoprolol tartrate  12.5 mg Oral BID  . pantoprazole  40 mg Oral BID  . sodium chloride flush  3 mL Intravenous Q12H  . sodium chloride flush  3 mL Intravenous Q12H  . topiramate  75 mg Oral QHS   Continuous Infusions: . sodium chloride    . sodium chloride       Radiological Exams on Admission: CARDIAC CATHETERIZATION  Result Date: 05/14/2020  Widely patent/normal widely patent coronary arteries.  Right dominant anatomy  Regional wall motion abnormality noted on left ventriculography. EF 40 to 50%. RECOMMENDATIONS:  Given context of clinical presentation, myopericarditis is now the leading diagnosis  in the differential. This presentation is not atypical stress cardiomyopathy. Recommend MRI/cardiac.   Impression/Recommendations Principal Problem:   NSTEMI (non-ST elevated myocardial infarction) ([place]) Active Problems:   Hyperlipidemia   Class 3 obesity   Anemia   PAF (paroxysmal atrial fibrillation) ([facility])   Obstructive sleep apnea  NSTEMI -Patient presented with acute onset of CP and was found to have troponin >27k -LHC was performed on 1/13 and had open coronaries -EF 40-50% -Patient is currently thought to have had myopericarditis as cause -Cardiac MRI is pending -Cardiology is continuing to manage this issue  SOB and fatigue -TRH was consulted regarding possible symptomatic anemia, specifically iron deficiency -Certainly this is a consideration - but in speaking with the patient she described having had these symptoms for months (and was actually seen in the ER for this in 01/2019 and reported several months of it then too) -Additional considerations include thyroid disease (normal TSH); autoimmune disease (sarcoidosis, lupus, etc); CHF (EF 40-50% on cath); and OHS -ESR and CRP are mildly elevated -Will order ANA and lupus testing for now -Consider pulm consult if further cardiology evaluation  is negative  Anemia -MCV is <80 and so this is microcytic anemia -MCV is < 70 indicating probable iron deficiency -With an MCV <70, elevated RDW, and reason for iron deficiency - iron deficiency can be presumed -With ongoing question, will check ferritin; if <15, diagnostic of iron deficiency; if >100, very unlikely iron deficiency -If ferritin is non-diagnostic, will check transferrin saturation - the lower it is, the more likely this is iron deficiency anemia -Based on this evaluation, iron deficiency appears unlikely to be the cause of her anemia and additional evaluation is reasonable -Would not recommend giving IV iron at this time -Would recheck CBC in AM - it is possible that today's sample was dilutional (at least in part)  HTN -It is possible that beta blocker therapy is contributing to her fatigue; will defer to cardiology about this medication -Continue Lisinopril  DM -Good control by A1c (6.4) -hold Glucophage, Semaglutide -Cover with moderate-scale SSI  Afib -Rate controlled with Atenolol -Continue Eliquis  OSA -may also have a component of OHS -Consider nocturnal CPAP -Consider outpatient referral for sleep study  HLD -Continue Lipitor  Migraines -Patient is on chronic suppression therapy with Aimovig, Topamax  Obesity -Body mass index is 52.82 kg/m..  -Weight loss should be encouraged -Outpatient PCP/bariatric medicine/bariatric surgery f/u encouraged    Thank you for this consultation.  Our [facility] hospitalist team will follow the patient with you for at least one additional day.   Time Spent: 50 minutes  Karmen Bongo M.D. [facility] 05/15/2020, 1:27 PM

## 2020-05-15 NOTE — Plan of Care (Signed)

## 2020-05-16 DIAGNOSIS — I214 Non-ST elevation (NSTEMI) myocardial infarction: Secondary | ICD-10-CM | POA: Diagnosis not present

## 2020-05-16 DIAGNOSIS — I401 Isolated myocarditis: Secondary | ICD-10-CM

## 2020-05-16 LAB — ENA+DNA/DS+ANTICH+CENTRO+JO...
Anti JO-1: <0.2 AI (ref 0.0–0.9)
Centromere Ab Screen: <0.2 AI (ref 0.0–0.9)
Chromatin Ab SerPl-aCnc: <0.2 AI (ref 0.0–0.9)
ENA SM Ab Ser-aCnc: <0.2 AI (ref 0.0–0.9)
Ribonucleic Protein: 1.3 AI — ABNORMAL HIGH (ref 0.0–0.9)
SSA (Ro) (ENA) Antibody, IgG: <0.2 AI (ref 0.0–0.9)
SSB (La) (ENA) Antibody, IgG: 0.3 AI (ref 0.0–0.9)
Scleroderma (Scl-70) (ENA) Antibody, IgG: <0.2 AI (ref 0.0–0.9)
ds DNA Ab: <1 IU/mL (ref 0–9)

## 2020-05-16 LAB — CBC
HCT: 24.7 % — ABNORMAL LOW (ref 36.0–46.0)
Hemoglobin: 7.9 g/dL — ABNORMAL LOW (ref 12.0–15.0)
MCH: 21.1 pg — ABNORMAL LOW (ref 26.0–34.0)
MCHC: 32 g/dL (ref 30.0–36.0)
MCV: 65.9 fL — ABNORMAL LOW (ref 80.0–100.0)
Platelets: 305 10*3/uL (ref 150–400)
RBC: 3.75 MIL/uL — ABNORMAL LOW (ref 3.87–5.11)
RDW: 19.6 % — ABNORMAL HIGH (ref 11.5–15.5)
WBC: 7.1 10*3/uL (ref 4.0–10.5)
nRBC: 0 % (ref 0.0–0.2)

## 2020-05-16 LAB — LUPUS ANTICOAGULANT PANEL
DRVVT: 34.6 s (ref 0.0–47.0)
PTT Lupus Anticoagulant: 33 s (ref 0.0–51.9)

## 2020-05-16 LAB — GLUCOSE, CAPILLARY
Glucose-Capillary: 104 mg/dL — ABNORMAL HIGH (ref 70–99)
Glucose-Capillary: 110 mg/dL — ABNORMAL HIGH (ref 70–99)
Glucose-Capillary: 155 mg/dL — ABNORMAL HIGH (ref 70–99)
Glucose-Capillary: 137 mg/dL — ABNORMAL HIGH (ref 70–99)

## 2020-05-16 LAB — ABO/RH: ABO/RH(D): B POS

## 2020-05-16 LAB — PREPARE RBC (CROSSMATCH)

## 2020-05-16 LAB — ANA W/REFLEX IF POSITIVE: Anti Nuclear Antibody (ANA): POSITIVE — AB

## 2020-05-16 MED ORDER — SODIUM CHLORIDE 0.9% IV SOLUTION: Freq: Once | INTRAVENOUS | Status: DC

## 2020-05-16 MED ORDER — FUROSEMIDE 10 MG/ML IJ SOLN
20.0000 mg | Freq: Once | INTRAMUSCULAR | Status: AC
  Administered 2020-05-16: 20 mg via INTRAVENOUS

## 2020-05-16 NOTE — Progress Notes (Signed)
Progress Note    Monique Gomez  FGH:[id number] DOB: 03-14-1956  DOA: 05/13/2020 PCP: Alycia Rossetti, MD    Brief Narrative:    Medical records reviewed and are as summarized below:  Monique Gomez is an 65 y.o. female h/o OSA; DM; afib on Eliquis; morbid obesity; HLD; and HTN who presented on 1/12 for NSTEMI.  She was admitted to CCU by the cardiology team.  She reports initially coming in for evaluation due to concern for COVID - lots of sick contacts plus fatigue, SOB, n/v.  She developed CP.  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  In further questioning, she has had progressive SOB for about the last 6-7 months.   Assessment/Plan:   Principal Problem:   NSTEMI (non-ST elevated myocardial infarction) ([place]) Active Problems:   Hyperlipidemia   Class 3 obesity   Anemia   PAF (paroxysmal atrial fibrillation) (H[facility])   Obstructive sleep apnea    Anemia -MCV is <80 and so this is microcytic anemia -has had thalassemia work up in 10/2019 with hematology in [place] -Patients with acute inflammation present altered iron status indexes that resemble those observed in the anemia of chronic disease. Fe, TIBC and SF lose diagnostic value. The concomitant presence of microcytosis and low % TS, and to a lesser extent the presence of one of these alterations, is suggestive of iron deficiency associated with inflammation.   -while acutely ill will use PRBCs for her anemia for more immediate improvement -prior to d/c would give IV FE, she will probably needs periodic transfusions as an outpatient (has hematology follow up)   Obesity -Body mass index is 52.82 kg/m..  -Weight loss should be encouraged -Outpatient PCP/bariatric medicine/bariatric surgery f/u encouraged   Myocarditits/OSA/HLD/PAF -defer to primary    Subjective:   Says her parents were also anemia but not sure what kind  Objective:    Vitals:   05/16/20 0000 05/16/20 0304 05/16/20  0400 05/16/20 0737  BP: (!) 87/69 (!) 89/64    Pulse:      Resp: (!) 22 (!) 22    Temp: 98.4 F (36.9 C) 98.8 F (37.1 C)  97.8 F (36.6 C)  TempSrc: Oral Oral  Oral  SpO2: 95% 95% 95%   Weight:      Height:        Intake/Output Summary (Last 24 hours) at 05/16/2020 0804 Last data filed at 05/15/2020 2100 Gross per 24 hour  Intake 720 ml  Output 1450 ml  Net -730 ml   Filed Weights   05/13/20 0943 05/14/20 0500 05/15/20 0214  Weight: 124.7 kg 125.2 kg 131 kg    Exam:  General: Appearance:    Severely obese female in no acute distress     Lungs:     respirations unlabored  Heart:    Normal heart rate. +LE edema  MS:   All extremities are intact.   Neurologic:   Awake, alert, oriented x 3. No apparent focal neurological           defect.     Data Reviewed:   I have personally reviewed following labs and imaging studies:  Labs: Labs show the following:   Basic Metabolic Panel: Recent Labs  Lab 05/13/20 1005 05/14/20 0052 05/14/20 0741 05/15/20 0456  NA 134* 136 137 134*  K 3.0* 3.3* 4.4 3.6  CL 98 104 107 105  CO2 23 21* 19* 19*  GLUCOSE 143* 103* 99 100*  BUN  24* 21 20 15   CREATININE 1.39* 1.14* 0.99 0.92  CALCIUM 9.0 8.8* 8.7* 8.5*   GFR Estimated Creatinine Clearance: 81.5 mL/min (by C-G formula based on SCr of 0.92 mg/dL). Liver Function Tests: Recent Labs  Lab 05/13/20 1005  AST 166*  ALT 41  ALKPHOS 59  BILITOT 0.6  PROT 7.9  ALBUMIN 3.4*   Recent Labs  Lab 05/13/20 1005  LIPASE 17   No results for input(s): AMMONIA in the last 168 hours. Coagulation profile Recent Labs  Lab 05/13/20 1005  INR 1.4*    CBC: Recent Labs  Lab 05/13/20 1005 05/14/20 0052 05/14/20 0741 05/15/20 0456 05/16/20 0107  WBC 14.5* 7.9 6.0 7.3 7.1  HGB 10.3* 9.0* 11.2* 8.1* 7.9*  HCT 34.4* 29.3* 35.5* 26.0* 24.7*  MCV 65.5* 66.7* 65.4* 67.5* 65.9*  PLT 312 268 235 279 305   Cardiac Enzymes: No results for input(s): CKTOTAL, CKMB, CKMBINDEX,  TROPONINI in the last 168 hours. BNP (last 3 results) No results for input(s): PROBNP in the last 8760 hours. CBG: Recent Labs  Lab 05/15/20 0643 05/15/20 0816 05/15/20 1140 05/15/20 2119 05/16/20 0653  GLUCAP 106* 106* 98 132* 104*   D-Dimer: No results for input(s): DDIMER in the last 72 hours. Hgb A1c: Recent Labs    05/13/20 1005 05/13/20 1635  HGBA1C 6.4* 6.4*   Lipid Profile: Recent Labs    05/13/20 1005  CHOL 162  HDL 24*  LDLCALC 107*  TRIG 156*  CHOLHDL 6.8   Thyroid function studies: Recent Labs    05/13/20 1005  TSH 3.035   Anemia work up: Recent Labs    05/15/20 1124  VITAMINB12 401  FOLATE 17.0  FERRITIN 54  TIBC 340  IRON 20*  RETICCTPCT 1.1   Sepsis Labs: Recent Labs  Lab 05/13/20 1635 05/14/20 0052 05/14/20 0741 05/15/20 0456 05/16/20 0107  PROCALCITON 0.33  --   --   --   --   WBC  --  7.9 6.0 7.3 7.1  LATICACIDVEN 1.9  --   --   --   --     Microbiology Recent Results (from the past 240 hour(s))  Resp Panel by RT-PCR (Flu A&B, Covid) Nasopharyngeal Swab     Status: None   Collection Time: 05/13/20  9:58 AM   Specimen: Nasopharyngeal Swab; Nasopharyngeal(NP) swabs in vial transport medium  Result Value Ref Range Status   SARS Coronavirus 2 by RT PCR NEGATIVE NEGATIVE Final    Comment: (NOTE) SARS-CoV-2 target nucleic acids are NOT DETECTED.  The SARS-CoV-2 RNA is generally detectable in upper respiratory specimens during the acute phase of infection. The lowest concentration of SARS-CoV-2 viral copies this assay can detect is 138 copies/mL. A negative result does not preclude SARS-Cov-2 infection and should not be used as the sole basis for treatment or other patient management decisions. A negative result may occur with  improper specimen collection/handling, submission of specimen other than nasopharyngeal swab, presence of viral mutation(s) within the areas targeted by this assay, and inadequate number of  viral copies(<138 copies/mL). A negative result must be combined with clinical observations, patient history, and epidemiological information. The expected result is Negative.  Fact Sheet for Patients:  EntrepreneurPulse.com.au  Fact Sheet for Healthcare Providers:  IncredibleEmployment.be  This test is no t yet approved or cleared by the [place] FDA and  has been authorized for detection and/or diagnosis of SARS-CoV-2 by FDA under an Emergency Use Authorization (EUA). This EUA will remain  in effect (meaning this  test can be used) for the duration of the COVID-19 declaration under Section 564(b)(1) of the Act, 21 U.S.C.section 360bbb-3(b)(1), unless the authorization is terminated  or revoked sooner.       Influenza A by PCR NEGATIVE NEGATIVE Final   Influenza B by PCR NEGATIVE NEGATIVE Final    Comment: (NOTE) The Xpert Xpress SARS-CoV-2/FLU/RSV plus assay is intended as an aid in the diagnosis of influenza from Nasopharyngeal swab specimens and should not be used as a sole basis for treatment. Nasal washings and aspirates are unacceptable for Xpert Xpress SARS-CoV-2/FLU/RSV testing.  Fact Sheet for Patients: EntrepreneurPulse.com.au  Fact Sheet for Healthcare Providers: IncredibleEmployment.be  This test is not yet approved or cleared by the [place] FDA and has been authorized for detection and/or diagnosis of SARS-CoV-2 by FDA under an Emergency Use Authorization (EUA). This EUA will remain in effect (meaning this test can be used) for the duration of the COVID-19 declaration under Section 564(b)(1) of the Act, 21 U.S.C. section 360bbb-3(b)(1), unless the authorization is terminated or revoked.  Performed at [facility] [address], [place], [place] [address]   MRSA PCR Screening     Status: None   Collection Time: 05/13/20  2:45 PM   Specimen: Nasal Mucosa; Nasopharyngeal   Result Value Ref Range Status   MRSA by PCR NEGATIVE NEGATIVE Final    Comment:        The GeneXpert MRSA Assay (FDA approved for NASAL specimens only), is one component of a comprehensive MRSA colonization surveillance program. It is not intended to diagnose MRSA infection nor to guide or monitor treatment for MRSA infections. Performed at [facility], [place] [address], [place], [place] [address]     Procedures and diagnostic studies:  CARDIAC CATHETERIZATION  Result Date: 05/14/2020  Widely patent/normal widely patent coronary arteries.  Right dominant anatomy  Regional wall motion abnormality noted on left ventriculography. EF 40 to 50%. RECOMMENDATIONS:  Given context of clinical presentation, myopericarditis is now the leading diagnosis in the differential. This presentation is not atypical stress cardiomyopathy. Recommend MRI/cardiac.  MR CARDIAC MORPHOLOGY W WO CONTRAST  Result Date: 05/15/2020 CLINICAL DATA:  37F with OSA, DM, Afib, morbid obesity, HLD, HTN presented with NSTEMI, hsTn >27K. Echo with EF 50%. Cath with normal coronary arteries, EF 40-50%. Evaluate for myocarditis EXAM: CARDIAC MRI TECHNIQUE: The patient was scanned on a 1.5 Tesla Siemens magnet. A dedicated cardiac coil was used. Functional imaging was done using Fiesta sequences. 2,3, and 4 chamber views were done to assess for RWMA's. Modified Simpson's rule using a short axis stack was used to calculate an ejection fraction on a dedicated work Conservation officer, nature. The patient received 10 cc of Gadavist. After 10 minutes inversion recovery sequences were used to assess for infiltration and scar tissue. CONTRAST:  10 cc  of Gadavist FINDINGS: Left ventricle: -Severe asymmetric hypertrophy measuring up to 71mm in basal septum (80mm in posterior wall) -Normal size -Mildly reduced systolic function -Markedly elevated native T1 (up to 1452ms), T2 (up to 76 ms), and ECV (up to 64%) -Extensive diffuse  LGE throughout myocardium LV EF:  41% (Normal 56-78%) Absolute volumes: LV EDV: 15mL (Normal 52-141 mL) LV ESV: 75mL (Normal 13-51 mL) LV SV: 23mL (Normal 33-97 mL) CO: 5.0L/min (Normal 2.7-6.0 L/min) Indexed volumes: LV EDV: 21mL/sq-m (Normal 41-81 mL/sq-m) LV ESV: 88mL/sq-m (Normal 12-21 mL/sq-m) LV SV: 22mL/sq-m (Normal 26-56 mL/sq-m) CI: 2.1L/min/sq-m (Normal 1.8-3.8 L/min/sq-m) Right ventricle: RV volumes were not able to be calculated  as several basal short axis slices were missing from the data set. Visually RV appears normal size with mild systolic dysfunction (apical akinesis) Left atrium: Mild enlargement Right atrium: Normal size Mitral valve: Mild regurgitation Aortic valve: No regurgitation Tricuspid valve: No regurgitation Pulmonic valve: No regurgitation Aorta: Normal proximal ascending aorta Pericardium: Moderate effusion, measures up to 57mm adjacent to LV inferior wall IMPRESSION: 1. Findings consistent with acute myocarditis including markedly elevated native T1 (up to 1453ms), T2 (up to 76 ms), and ECV (up to 64%). In addition there is extensive/diffuse late gadolinium enhancement. 2. While clearly has acute myocarditis, ECV is higher and LGE more extensive than typically seen in myocarditis. This could just be due to severe inflammation from myocarditis, or could have an underlying cardiomyopathy (does have severe asymmetric LVH that was present on prior echo 03/2019, and given ECV/LGE pattern, underlying amyloid is a consideration) with myocarditis in addition to this. If having ventricular arrhythmias, high degree AV block, or hemodynamic instability, would consider endomyocardial biopsy for further evaluation. 3. Normal LV size, severe asymmetric hypertrophy measuring up to 71mm in basal septum (47mm in posterior wall), and mild systolic dysfunction (EF 123XX123). Unfortunately basal most LV short axis slice was not included in the data set to calculate EF, which introduces a small amount of  error in the EF calculation, though visually EF appears 40-45% 4. RV volumes were not able to be calculated as several basal short axis slices were missing from the data set. Visually RV appears normal size with mild systolic dysfunction (apical akinesis) 5. Moderate pericardial effusion, measures up to 5mm adjacent to LV inferior wall Electronically Signed   By: Oswaldo Milian MD   On: 05/15/2020 21:14    Medications:   . aspirin EC  81 mg Oral Daily  . atorvastatin  80 mg Oral q1800  . Chlorhexidine Gluconate Cloth  6 each Topical Daily  . colchicine  0.6 mg Oral Daily  . enoxaparin (LOVENOX) injection  40 mg Subcutaneous Q24H  . fluticasone  2 spray Each Nare Daily  . insulin aspart  0-15 Units Subcutaneous TID WC  . ipratropium  2 spray Each Nare Q12H  . metoprolol tartrate  12.5 mg Oral BID  . pantoprazole  40 mg Oral BID  . sodium chloride flush  3 mL Intravenous Q12H  . sodium chloride flush  3 mL Intravenous Q12H  . topiramate  75 mg Oral QHS   Continuous Infusions: . sodium chloride    . sodium chloride       LOS: 3 days   Geradine Girt  [facility]   How to contact the [facility] Attending or Consulting provider [place] or covering provider during after hours [place], for this patient?  1. Check the care team in [facility] and look for a) attending/consulting TRH provider listed and b) the [facility] team listed 2. Log into [email or web address] and use [place]'s universal password to access. If you do not have the password, please contact the hospital operator. 3. Locate the [facility] provider you are looking for under [facility] and page to a number that you can be directly reached. 4. If you still have difficulty reaching the provider, please page the [facility] (Director on Call) for the Hospitalists listed on amion for assistance.  05/16/2020, 8:04 AM

## 2020-05-16 NOTE — Plan of Care (Signed)

## 2020-05-16 NOTE — Progress Notes (Addendum)
Progress Note  Patient Name: Monique Gomez Date of Encounter: 05/16/2020  [facility] Cardiologist: Rozann Lesches, MD   Subjective   Less dyspnea no chest pain   Inpatient Medications    Scheduled Meds: . aspirin EC  81 mg Oral Daily  . atorvastatin  80 mg Oral q1800  . Chlorhexidine Gluconate Cloth  6 each Topical Daily  . colchicine  0.6 mg Oral Daily  . enoxaparin (LOVENOX) injection  40 mg Subcutaneous Q24H  . fluticasone  2 spray Each Nare Daily  . insulin aspart  0-15 Units Subcutaneous TID WC  . ipratropium  2 spray Each Nare Q12H  . metoprolol tartrate  12.5 mg Oral BID  . pantoprazole  40 mg Oral BID  . sodium chloride flush  3 mL Intravenous Q12H  . sodium chloride flush  3 mL Intravenous Q12H  . topiramate  75 mg Oral QHS   Continuous Infusions: . sodium chloride    . sodium chloride     PRN Meds: sodium chloride, acetaminophen, nitroGLYCERIN, ondansetron (ZOFRAN) IV, oxyCODONE, sodium chloride flush   Vital Signs    Vitals:   05/16/20 0000 05/16/20 0304 05/16/20 0400 05/16/20 0737  BP: (!) 87/69 (!) 89/64    Pulse:      Resp: (!) 22 (!) 22    Temp: 98.4 F (36.9 C) 98.8 F (37.1 C)  97.8 F (36.6 C)  TempSrc: Oral Oral  Oral  SpO2: 95% 95% 95%   Weight:      Height:        Intake/Output Summary (Last 24 hours) at 05/16/2020 0816 Last data filed at 05/15/2020 2100 Gross per 24 hour  Intake 720 ml  Output 1450 ml  Net -730 ml   Last 3 Weights 05/15/2020 05/14/2020 05/13/2020  Weight (lbs) 288 lb 12.8 oz 276 lb 0.3 oz 275 lb  Weight (kg) 131 kg 125.2 kg 124.739 kg      Telemetry    Sinus tach - Personally Reviewed 05/16/2020   ECG     SR RBBB inferior lateral ST elevation   Physical Exam   Obese black female  Lungs clear No murmur / rub Abdomen soft Plus one edema Palpable peripheral pulses    Labs    High Sensitivity Troponin:   Recent Labs  Lab 05/13/20 1005 05/13/20 1221  TROPONINIHS >27,000* >27,000*       Chemistry Recent Labs  Lab 05/13/20 1005 05/14/20 0052 05/14/20 0741 05/15/20 0456  NA 134* 136 137 134*  K 3.0* 3.3* 4.4 3.6  CL 98 104 107 105  CO2 23 21* 19* 19*  GLUCOSE 143* 103* 99 100*  BUN 24* '21 20 15  ' CREATININE 1.39* 1.14* 0.99 0.92  CALCIUM 9.0 8.8* 8.7* 8.5*  PROT 7.9  --   --   --   ALBUMIN 3.4*  --   --   --   AST 166*  --   --   --   ALT 41  --   --   --   ALKPHOS 59  --   --   --   BILITOT 0.6  --   --   --   GFRNONAA 43* 54* >60 >60  ANIONGAP '13 11 11 10     ' Hematology Recent Labs  Lab 05/14/20 0741 05/15/20 0456 05/15/20 1124 05/16/20 0107  WBC 6.0 7.3  --  7.1  RBC 5.43* 3.85* 4.50 3.75*  HGB 11.2* 8.1*  --  7.9*  HCT 35.5* 26.0*  --  24.7*  MCV 65.4* 67.5*  --  65.9*  MCH 20.6* 21.0*  --  21.1*  MCHC 31.5 31.2  --  32.0  RDW 20.7* 19.9*  --  19.6*  PLT 235 279  --  305    BNP Recent Labs  Lab 05/13/20 1005  BNP 1,175.0*     DDimer No results for input(s): DDIMER in the last 168 hours.   Radiology    CARDIAC CATHETERIZATION  Result Date: 05/14/2020  Widely patent/normal widely patent coronary arteries.  Right dominant anatomy  Regional wall motion abnormality noted on left ventriculography. EF 40 to 50%. RECOMMENDATIONS:  Given context of clinical presentation, myopericarditis is now the leading diagnosis in the differential. This presentation is not atypical stress cardiomyopathy. Recommend MRI/cardiac.  MR CARDIAC MORPHOLOGY W WO CONTRAST  Result Date: 05/15/2020 CLINICAL DATA:  22F with OSA, DM, Afib, morbid obesity, HLD, HTN presented with NSTEMI, hsTn >27K. Echo with EF 50%. Cath with normal coronary arteries, EF 40-50%. Evaluate for myocarditis EXAM: CARDIAC MRI TECHNIQUE: The patient was scanned on a 1.5 Tesla Siemens magnet. A dedicated cardiac coil was used. Functional imaging was done using Fiesta sequences. 2,3, and 4 chamber views were done to assess for RWMA's. Modified Simpson's rule using a short axis stack was  used to calculate an ejection fraction on a dedicated work Conservation officer, nature. The patient received 10 cc of Gadavist. After 10 minutes inversion recovery sequences were used to assess for infiltration and scar tissue. CONTRAST:  10 cc  of Gadavist FINDINGS: Left ventricle: -Severe asymmetric hypertrophy measuring up to 79m in basal septum (119min posterior wall) -Normal size -Mildly reduced systolic function -Markedly elevated native T1 (up to 1447109m T2 (up to 76 ms), and ECV (up to 64%) -Extensive diffuse LGE throughout myocardium LV EF:  41% (Normal 56-78%) Absolute volumes: LV EDV: 120m27mormal 52-141 mL) LV ESV: 70mL69mrmal 13-51 mL) LV SV: 50mL 25mmal 33-97 mL) CO: 5.0L/min (Normal 2.7-6.0 L/min) Indexed volumes: LV EDV: 50mL/s50m(Normal 41-81 mL/sq-m) LV ESV: 29mL/sq43mNormal 12-21 mL/sq-m) LV SV: 21mL/sq-57mormal 26-56 mL/sq-m) CI: 2.1L/min/sq-m (Normal 1.8-3.8 L/min/sq-m) Right ventricle: RV volumes were not able to be calculated as several basal short axis slices were missing from the data set. Visually RV appears normal size with mild systolic dysfunction (apical akinesis) Left atrium: Mild enlargement Right atrium: Normal size Mitral valve: Mild regurgitation Aortic valve: No regurgitation Tricuspid valve: No regurgitation Pulmonic valve: No regurgitation Aorta: Normal proximal ascending aorta Pericardium: Moderate effusion, measures up to 12mm adja31m to LV inferior wall IMPRESSION: 1. Findings consistent with acute myocarditis including markedly elevated native T1 (up to 1447ms), T2 85mto 76 ms), and ECV (up to 64%). In addition there is extensive/diffuse late gadolinium enhancement. 2. While clearly has acute myocarditis, ECV is higher and LGE more extensive than typically seen in myocarditis. This could just be due to severe inflammation from myocarditis, or could have an underlying cardiomyopathy (does have severe asymmetric LVH that was present on prior echo 03/2019, and  given ECV/LGE pattern, underlying amyloid is a consideration) with myocarditis in addition to this. If having ventricular arrhythmias, high degree AV block, or hemodynamic instability, would consider endomyocardial biopsy for further evaluation. 3. Normal LV size, severe asymmetric hypertrophy measuring up to 22mm in bas45meptum (15mm in post53mr wall), and mild systolic dysfunction (EF 41%). Unfortu00%ely basal most LV short axis slice was not included in the data set to calculate EF, which introduces a small amount of error in  the EF calculation, though visually EF appears 40-45% 4. RV volumes were not able to be calculated as several basal short axis slices were missing from the data set. Visually RV appears normal size with mild systolic dysfunction (apical akinesis) 5. Moderate pericardial effusion, measures up to 42m adjacent to LV inferior wall Electronically Signed   By: COswaldo MilianMD   On: 05/15/2020 21:14    Cardiac Studies   Cath films personally reviewed 05/16/2020 normal coronary arteries no obstruction   Patient Profile     65y.o. female admitted with dyspnea, elevated troponins, abnormal ECG and anemia Cath with no CAD  Assessment & Plan    1. Myocarditis.:  Reviewed MRI and marked gadolinium uptake in diffuse pattern She is on colchicine NSAI's relatively contraindicated with myocardial involvement ESR. > 103 , CRP 8.1, Troponin > 27,000 No arrhythmias EF 41% by MRI continue beta blocker for relative tachycardia BP too soft For ACE/ARB at this time Lasix as needed to keep I/O's Will check viral panel. Discussed with DB and reviewed literature and no role for steroids. Giant cell arteritis less likely with no arrhythmia   2. Anemia:  With inflammation ferritin falls in normal range Spoke with hospitalist and will give iv iron and transfuse one unit Give lasix 20 mg iv one dose an hour before as EDP was high at cath   For questions or updates, please contact CVanderbiltPlease consult [email or web address] for contact info under        Signed, PJenkins Rouge MD  05/16/2020, 8:16 AM  \

## 2020-05-17 DIAGNOSIS — I401 Isolated myocarditis: Secondary | ICD-10-CM | POA: Diagnosis not present

## 2020-05-17 LAB — CBC
HCT: 31.7 % — ABNORMAL LOW (ref 36.0–46.0)
Hemoglobin: 9.9 g/dL — ABNORMAL LOW (ref 12.0–15.0)
MCH: 21 pg — ABNORMAL LOW (ref 26.0–34.0)
MCHC: 31.2 g/dL (ref 30.0–36.0)
MCV: 67.3 fL — ABNORMAL LOW (ref 80.0–100.0)
Platelets: 332 10*3/uL (ref 150–400)
RBC: 4.71 MIL/uL (ref 3.87–5.11)
RDW: 21.8 % — ABNORMAL HIGH (ref 11.5–15.5)
WBC: 5.8 10*3/uL (ref 4.0–10.5)
nRBC: 0 % (ref 0.0–0.2)

## 2020-05-17 LAB — BASIC METABOLIC PANEL
Anion gap: 11 (ref 5–15)
BUN: 15 mg/dL (ref 8–23)
CO2: 23 mmol/L (ref 22–32)
Calcium: 8.9 mg/dL (ref 8.9–10.3)
Chloride: 108 mmol/L (ref 98–111)
Creatinine, Ser: 1.05 mg/dL — ABNORMAL HIGH (ref 0.44–1.00)
GFR, Estimated: 60 mL/min — ABNORMAL LOW (ref >60)
Glucose, Bld: 161 mg/dL — ABNORMAL HIGH (ref 70–99)
Potassium: 3.7 mmol/L (ref 3.5–5.1)
Sodium: 142 mmol/L (ref 135–145)

## 2020-05-17 LAB — TYPE AND SCREEN
ABO/RH(D): B POS
Antibody Screen: NEGATIVE
Unit division: 0

## 2020-05-17 LAB — GLUCOSE, CAPILLARY
Glucose-Capillary: 136 mg/dL — ABNORMAL HIGH (ref 70–99)
Glucose-Capillary: 111 mg/dL — ABNORMAL HIGH (ref 70–99)
Glucose-Capillary: 130 mg/dL — ABNORMAL HIGH (ref 70–99)
Glucose-Capillary: 114 mg/dL — ABNORMAL HIGH (ref 70–99)

## 2020-05-17 LAB — BPAM RBC
Blood Product Expiration Date: 202202012359
ISSUE DATE / TIME: 202201151328
Unit Type and Rh: 7300

## 2020-05-17 MED ORDER — FUROSEMIDE 10 MG/ML IJ SOLN
20.0000 mg | Freq: Once | INTRAMUSCULAR | Status: AC
  Administered 2020-05-17: 20 mg via INTRAVENOUS
  Filled 2020-05-17: qty 2

## 2020-05-17 MED ORDER — SODIUM CHLORIDE 0.9 % IV SOLN
510.0000 mg | Freq: Once | INTRAVENOUS | Status: AC
  Administered 2020-05-17: 510 mg via INTRAVENOUS
  Filled 2020-05-17: qty 17

## 2020-05-17 MED ORDER — LOSARTAN POTASSIUM 25 MG PO TABS
25.0000 mg | ORAL_TABLET | Freq: Every day | ORAL | Status: DC
  Administered 2020-05-17 – 2020-05-19 (×3): 25 mg via ORAL
  Filled 2020-05-17 (×3): qty 1

## 2020-05-17 MED ORDER — INFLUENZA VAC SPLIT QUAD 0.5 ML IM SUSY
0.5000 mL | PREFILLED_SYRINGE | INTRAMUSCULAR | Status: AC
  Administered 2020-05-19: 0.5 mL via INTRAMUSCULAR
  Filled 2020-05-17: qty 0.5

## 2020-05-17 NOTE — Progress Notes (Signed)
Progress Note    Monique Gomez  OZD:[id number] DOB: Sep 21, 1955  DOA: 05/13/2020 PCP: Alycia Rossetti, MD    Brief Narrative:    Medical records reviewed and are as summarized below:  Monique Gomez is an 65 y.o. female h/o OSA; DM; afib on Eliquis; morbid obesity; HLD; and HTN who presented on 1/12 for NSTEMI.  She was admitted to CCU by the cardiology team.  She reports initially coming in for evaluation due to concern for COVID - lots of sick contacts plus fatigue, SOB, n/v.  She developed CP.  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  In further questioning, she has had progressive SOB for about the last 6-7 months.   Assessment/Plan:   Principal Problem:   NSTEMI (non-ST elevated myocardial infarction) ([place]) Active Problems:   Hyperlipidemia   Class 3 obesity   Anemia   PAF (paroxysmal atrial fibrillation) (HCC)   Obstructive sleep apnea    Anemia -MCV is <80 and so this is microcytic anemia -has had thalassemia work up in 10/2019 with hematology in [place] -Patients with acute inflammation present altered iron status indexes that resemble those observed in the anemia of chronic disease. Fe, TIBC and SF lose diagnostic value. The concomitant presence of microcytosis and low % TS, and to a lesser extent the presence of one of these alterations, is suggestive of iron deficiency associated with inflammation.   -while acutely ill will use PRBCs for her anemia for more immediate improvement (s/p 1 unit with good results) -will give IV FE today as possible d/c in the AM, she will probably needs periodic transfusions as an outpatient (has hematology follow up)   Obesity -Body mass index is 52.82 kg/m..  -Weight loss should be encouraged -Outpatient PCP/bariatric medicine/bariatric surgery f/u encouraged   Myocarditits/OSA/HLD/PAF -defer to primary    Subjective:   Breathing better today  Objective:    Vitals:   05/16/20 2157 05/16/20  2213 05/17/20 0645 05/17/20 0800  BP:  106/74 106/80 114/72  Pulse:  (!) 105 98 (!) 102  Resp: 18  20 20   Temp: 97.9 F (36.6 C)  98 F (36.7 C) 98.6 F (37 C)  TempSrc: Oral  Oral Oral  SpO2:   97% 100%  Weight:   125 kg   Height:        Intake/Output Summary (Last 24 hours) at 05/17/2020 2595 Last data filed at 05/17/2020 0804 Gross per 24 hour  Intake 918 ml  Output 200 ml  Net 718 ml   Filed Weights   05/14/20 0500 05/15/20 0214 05/17/20 0645  Weight: 125.2 kg 131 kg 125 kg    Exam:  General: Appearance:    Severely obese female in no acute distress     Lungs:     respirations unlabored  Heart:    Tachycardic.    MS:   All extremities are intact.   Neurologic:   Awake, alert, oriented x 3. No apparent focal neurological           defect.      Data Reviewed:   I have personally reviewed following labs and imaging studies:  Labs: Labs show the following:   Basic Metabolic Panel: Recent Labs  Lab 05/13/20 1005 05/14/20 0052 05/14/20 0741 05/15/20 0456 05/17/20 0223  NA 134* 136 137 134* 142  K 3.0* 3.3* 4.4 3.6 3.7  CL 98 104 107 105 108  CO2 23 21* 19* 19* 23  GLUCOSE  143* 103* 99 100* 161*  BUN 24* 21 20 15 15   CREATININE 1.39* 1.14* 0.99 0.92 1.05*  CALCIUM 9.0 8.8* 8.7* 8.5* 8.9   GFR Estimated Creatinine Clearance: 69.3 mL/min (A) (by C-G formula based on SCr of 1.05 mg/dL (H)). Liver Function Tests: Recent Labs  Lab 05/13/20 1005  AST 166*  ALT 41  ALKPHOS 59  BILITOT 0.6  PROT 7.9  ALBUMIN 3.4*   Recent Labs  Lab 05/13/20 1005  LIPASE 17   No results for input(s): AMMONIA in the last 168 hours. Coagulation profile Recent Labs  Lab 05/13/20 1005  INR 1.4*    CBC: Recent Labs  Lab 05/14/20 0052 05/14/20 0741 05/15/20 0456 05/16/20 0107 05/17/20 0223  WBC 7.9 6.0 7.3 7.1 5.8  HGB 9.0* 11.2* 8.1* 7.9* 9.9*  HCT 29.3* 35.5* 26.0* 24.7* 31.7*  MCV 66.7* 65.4* 67.5* 65.9* 67.3*  PLT 268 235 279 305 332   Cardiac  Enzymes: No results for input(s): CKTOTAL, CKMB, CKMBINDEX, TROPONINI in the last 168 hours. BNP (last 3 results) No results for input(s): PROBNP in the last 8760 hours. CBG: Recent Labs  Lab 05/15/20 2119 05/16/20 0653 05/16/20 1125 05/16/20 1706 05/16/20 2221  GLUCAP 132* 104* 110* 155* 137*   D-Dimer: No results for input(s): DDIMER in the last 72 hours. Hgb A1c: No results for input(s): HGBA1C in the last 72 hours. Lipid Profile: No results for input(s): CHOL, HDL, LDLCALC, TRIG, CHOLHDL, LDLDIRECT in the last 72 hours. Thyroid function studies: No results for input(s): TSH, T4TOTAL, T3FREE, THYROIDAB in the last 72 hours.  Invalid input(s): FREET3 Anemia work up: Recent Labs    05/15/20 1124  VITAMINB12 401  FOLATE 17.0  FERRITIN 54  TIBC 340  IRON 20*  RETICCTPCT 1.1   Sepsis Labs: Recent Labs  Lab 05/13/20 1635 05/14/20 0052 05/14/20 0741 05/15/20 0456 05/16/20 0107 05/17/20 0223  PROCALCITON 0.33  --   --   --   --   --   WBC  --    < > 6.0 7.3 7.1 5.8  LATICACIDVEN 1.9  --   --   --   --   --    < > = values in this interval not displayed.    Microbiology Recent Results (from the past 240 hour(s))  Resp Panel by RT-PCR (Flu A&B, Covid) Nasopharyngeal Swab     Status: None   Collection Time: 05/13/20  9:58 AM   Specimen: Nasopharyngeal Swab; Nasopharyngeal(NP) swabs in vial transport medium  Result Value Ref Range Status   SARS Coronavirus 2 by RT PCR NEGATIVE NEGATIVE Final    Comment: (NOTE) SARS-CoV-2 target nucleic acids are NOT DETECTED.  The SARS-CoV-2 RNA is generally detectable in upper respiratory specimens during the acute phase of infection. The lowest concentration of SARS-CoV-2 viral copies this assay can detect is 138 copies/mL. A negative result does not preclude SARS-Cov-2 infection and should not be used as the sole basis for treatment or other patient management decisions. A negative result may occur with  improper specimen  collection/handling, submission of specimen other than nasopharyngeal swab, presence of viral mutation(s) within the areas targeted by this assay, and inadequate number of viral copies(<138 copies/mL). A negative result must be combined with clinical observations, patient history, and epidemiological information. The expected result is Negative.  Fact Sheet for Patients:  EntrepreneurPulse.com.au  Fact Sheet for Healthcare Providers:  IncredibleEmployment.be  This test is no t yet approved or cleared by the [place] and  has been authorized for detection and/or diagnosis of SARS-CoV-2 by FDA under an Emergency Use Authorization (EUA). This EUA will remain  in effect (meaning this test can be used) for the duration of the COVID-19 declaration under Section 564(b)(1) of the Act, 21 U.S.C.section 360bbb-3(b)(1), unless the authorization is terminated  or revoked sooner.       Influenza A by PCR NEGATIVE NEGATIVE Final   Influenza B by PCR NEGATIVE NEGATIVE Final    Comment: (NOTE) The Xpert Xpress SARS-CoV-2/FLU/RSV plus assay is intended as an aid in the diagnosis of influenza from Nasopharyngeal swab specimens and should not be used as a sole basis for treatment. Nasal washings and aspirates are unacceptable for Xpert Xpress SARS-CoV-2/FLU/RSV testing.  Fact Sheet for Patients: EntrepreneurPulse.com.au  Fact Sheet for Healthcare Providers: IncredibleEmployment.be  This test is not yet approved or cleared by the [place] FDA and has been authorized for detection and/or diagnosis of SARS-CoV-2 by FDA under an Emergency Use Authorization (EUA). This EUA will remain in effect (meaning this test can be used) for the duration of the COVID-19 declaration under Section 564(b)(1) of the Act, 21 U.S.C. section 360bbb-3(b)(1), unless the authorization is terminated or revoked.  Performed at [facility], [address], [place], [place] [address]   MRSA PCR Screening     Status: None   Collection Time: 05/13/20  2:45 PM   Specimen: Nasal Mucosa; Nasopharyngeal  Result Value Ref Range Status   MRSA by PCR NEGATIVE NEGATIVE Final    Comment:        The GeneXpert MRSA Assay (FDA approved for NASAL specimens only), is one component of a comprehensive MRSA colonization surveillance program. It is not intended to diagnose MRSA infection nor to guide or monitor treatment for MRSA infections. Performed at [facility], [place] [address] [place], [place] [address]     Procedures and diagnostic studies:  MR CARDIAC MORPHOLOGY W WO CONTRAST  Result Date: 05/15/2020 CLINICAL DATA:  33F with OSA, DM, Afib, morbid obesity, HLD, HTN presented with NSTEMI, hsTn >27K. Echo with EF 50%. Cath with normal coronary arteries, EF 40-50%. Evaluate for myocarditis EXAM: CARDIAC MRI TECHNIQUE: The patient was scanned on a 1.5 Tesla Siemens magnet. A dedicated cardiac coil was used. Functional imaging was done using Fiesta sequences. 2,3, and 4 chamber views were done to assess for RWMA's. Modified Simpson's rule using a short axis stack was used to calculate an ejection fraction on a dedicated work Conservation officer, nature. The patient received 10 cc of Gadavist. After 10 minutes inversion recovery sequences were used to assess for infiltration and scar tissue. CONTRAST:  10 cc  of Gadavist FINDINGS: Left ventricle: -Severe asymmetric hypertrophy measuring up to 78mm in basal septum (68mm in posterior wall) -Normal size -Mildly reduced systolic function -Markedly elevated native T1 (up to 1424ms), T2 (up to 76 ms), and ECV (up to 64%) -Extensive diffuse LGE throughout myocardium LV EF:  41% (Normal 56-78%) Absolute volumes: LV EDV: 170mL (Normal 52-141 mL) LV ESV: 16mL (Normal 13-51 mL) LV SV: 34mL (Normal 33-97 mL) CO: 5.0L/min (Normal 2.7-6.0 L/min) Indexed volumes: LV EDV: 15mL/sq-m (Normal 41-81  mL/sq-m) LV ESV: 42mL/sq-m (Normal 12-21 mL/sq-m) LV SV: 35mL/sq-m (Normal 26-56 mL/sq-m) CI: 2.1L/min/sq-m (Normal 1.8-3.8 L/min/sq-m) Right ventricle: RV volumes were not able to be calculated as several basal short axis slices were missing from the data set. Visually RV appears normal size with mild systolic dysfunction (apical akinesis) Left atrium: Mild enlargement Right atrium: Normal  size Mitral valve: Mild regurgitation Aortic valve: No regurgitation Tricuspid valve: No regurgitation Pulmonic valve: No regurgitation Aorta: Normal proximal ascending aorta Pericardium: Moderate effusion, measures up to 73mm adjacent to LV inferior wall IMPRESSION: 1. Findings consistent with acute myocarditis including markedly elevated native T1 (up to 1422ms), T2 (up to 76 ms), and ECV (up to 64%). In addition there is extensive/diffuse late gadolinium enhancement. 2. While clearly has acute myocarditis, ECV is higher and LGE more extensive than typically seen in myocarditis. This could just be due to severe inflammation from myocarditis, or could have an underlying cardiomyopathy (does have severe asymmetric LVH that was present on prior echo 03/2019, and given ECV/LGE pattern, underlying amyloid is a consideration) with myocarditis in addition to this. If having ventricular arrhythmias, high degree AV block, or hemodynamic instability, would consider endomyocardial biopsy for further evaluation. 3. Normal LV size, severe asymmetric hypertrophy measuring up to 61mm in basal septum (24mm in posterior wall), and mild systolic dysfunction (EF 123XX123). Unfortunately basal most LV short axis slice was not included in the data set to calculate EF, which introduces a small amount of error in the EF calculation, though visually EF appears 40-45% 4. RV volumes were not able to be calculated as several basal short axis slices were missing from the data set. Visually RV appears normal size with mild systolic dysfunction (apical akinesis)  5. Moderate pericardial effusion, measures up to 13mm adjacent to LV inferior wall Electronically Signed   By: Oswaldo Milian MD   On: 05/15/2020 21:14    Medications:   . sodium chloride   Intravenous Once  . aspirin EC  81 mg Oral Daily  . atorvastatin  80 mg Oral q1800  . colchicine  0.6 mg Oral Daily  . enoxaparin (LOVENOX) injection  40 mg Subcutaneous Q24H  . fluticasone  2 spray Each Nare Daily  . insulin aspart  0-15 Units Subcutaneous TID WC  . ipratropium  2 spray Each Nare Q12H  . losartan  25 mg Oral Daily  . metoprolol tartrate  12.5 mg Oral BID  . pantoprazole  40 mg Oral BID  . sodium chloride flush  3 mL Intravenous Q12H  . sodium chloride flush  3 mL Intravenous Q12H  . topiramate  75 mg Oral QHS   Continuous Infusions: . sodium chloride    . sodium chloride    . ferumoxytol       LOS: 4 days   Geradine Girt  [facility]   How to contact the [facility] Attending or Consulting provider [place] or covering provider during after hours [place], for this patient?  1. Check the care team in [facility] and look for a) attending/consulting TR[facility] provider listed and b) the [facility] team listed 2. Log into [email or web address] and use [place]'s universal password to access. If you do not have the password, please contact the hospital operator. 3. Locate the [facility] provider you are looking for under [facility] and page to a number that you can be directly reached. 4. If you still have difficulty reaching the provider, please page the [facility] (Director on Call) for the Hospitalists listed on amion for assistance.  05/17/2020, 9:22 AM

## 2020-05-17 NOTE — Progress Notes (Signed)
Progress Note  Patient Name: Monique Gomez Date of Encounter: 05/17/2020  [facility] Cardiologist: Rozann Lesches, MD   Subjective   Still with some dyspnea transferred to tele floor   Inpatient Medications    Scheduled Meds: . sodium chloride   Intravenous Once  . aspirin EC  81 mg Oral Daily  . atorvastatin  80 mg Oral q1800  . colchicine  0.6 mg Oral Daily  . enoxaparin (LOVENOX) injection  40 mg Subcutaneous Q24H  . fluticasone  2 spray Each Nare Daily  . insulin aspart  0-15 Units Subcutaneous TID WC  . ipratropium  2 spray Each Nare Q12H  . metoprolol tartrate  12.5 mg Oral BID  . pantoprazole  40 mg Oral BID  . sodium chloride flush  3 mL Intravenous Q12H  . sodium chloride flush  3 mL Intravenous Q12H  . topiramate  75 mg Oral QHS   Continuous Infusions: . sodium chloride    . sodium chloride     PRN Meds: sodium chloride, acetaminophen, nitroGLYCERIN, ondansetron (ZOFRAN) IV, oxyCODONE, sodium chloride flush   Vital Signs    Vitals:   05/16/20 2157 05/16/20 2213 05/17/20 0645 05/17/20 0800  BP:  106/74 106/80 114/72  Pulse:  (!) 105 98 (!) 102  Resp: '18  20 20  ' Temp: 97.9 F (36.6 C)  98 F (36.7 C) 98.6 F (37 C)  TempSrc: Oral  Oral Oral  SpO2:   97% 100%  Weight:   125 kg   Height:        Intake/Output Summary (Last 24 hours) at 05/17/2020 0823 Last data filed at 05/16/2020 2222 Gross per 24 hour  Intake 678 ml  Output --  Net 678 ml   Last 3 Weights 05/17/2020 05/15/2020 05/14/2020  Weight (lbs) 275 lb 9.2 oz 288 lb 12.8 oz 276 lb 0.3 oz  Weight (kg) 125 kg 131 kg 125.2 kg      Telemetry    Sinus tach - Personally Reviewed 05/17/2020   ECG     SR RBBB inferior lateral ST elevation   Physical Exam   Obese black female  Lungs clear No murmur / rub Abdomen soft Plus one edema Palpable peripheral pulses   Lupus type rash on face   Labs    High Sensitivity Troponin:   Recent Labs  Lab 05/13/20 1005 05/13/20 1221   TROPONINIHS >27,000* >27,000*      Chemistry Recent Labs  Lab 05/13/20 1005 05/14/20 0052 05/14/20 0741 05/15/20 0456 05/17/20 0223  NA 134*   < > 137 134* 142  K 3.0*   < > 4.4 3.6 3.7  CL 98   < > 107 105 108  CO2 23   < > 19* 19* 23  GLUCOSE 143*   < > 99 100* 161*  BUN 24*   < > '20 15 15  ' CREATININE 1.39*   < > 0.99 0.92 1.05*  CALCIUM 9.0   < > 8.7* 8.5* 8.9  PROT 7.9  --   --   --   --   ALBUMIN 3.4*  --   --   --   --   AST 166*  --   --   --   --   ALT 41  --   --   --   --   ALKPHOS 59  --   --   --   --   BILITOT 0.6  --   --   --   --  GFRNONAA 43*   < > >60 >60 60*  ANIONGAP 13   < > '11 10 11   ' < > = values in this interval not displayed.     Hematology Recent Labs  Lab 05/15/20 0456 05/15/20 1124 05/16/20 0107 05/17/20 0223  WBC 7.3  --  7.1 5.8  RBC 3.85* 4.50 3.75* 4.71  HGB 8.1*  --  7.9* 9.9*  HCT 26.0*  --  24.7* 31.7*  MCV 67.5*  --  65.9* 67.3*  MCH 21.0*  --  21.1* 21.0*  MCHC 31.2  --  32.0 31.2  RDW 19.9*  --  19.6* 21.8*  PLT 279  --  305 332    BNP Recent Labs  Lab 05/13/20 1005  BNP 1,175.0*     DDimer No results for input(s): DDIMER in the last 168 hours.   Radiology    MR CARDIAC MORPHOLOGY W WO CONTRAST  Result Date: 05/15/2020 CLINICAL DATA:  11F with OSA, DM, Afib, morbid obesity, HLD, HTN presented with NSTEMI, hsTn >27K. Echo with EF 50%. Cath with normal coronary arteries, EF 40-50%. Evaluate for myocarditis EXAM: CARDIAC MRI TECHNIQUE: The patient was scanned on a 1.5 Tesla Siemens magnet. A dedicated cardiac coil was used. Functional imaging was done using Fiesta sequences. 2,3, and 4 chamber views were done to assess for RWMA's. Modified Simpson's rule using a short axis stack was used to calculate an ejection fraction on a dedicated work Conservation officer, nature. The patient received 10 cc of Gadavist. After 10 minutes inversion recovery sequences were used to assess for infiltration and scar tissue. CONTRAST:   10 cc  of Gadavist FINDINGS: Left ventricle: -Severe asymmetric hypertrophy measuring up to 50m in basal septum (135min posterior wall) -Normal size -Mildly reduced systolic function -Markedly elevated native T1 (up to 144753m T2 (up to 76 ms), and ECV (up to 64%) -Extensive diffuse LGE throughout myocardium LV EF:  41% (Normal 56-78%) Absolute volumes: LV EDV: 120m57mormal 52-141 mL) LV ESV: 70mL79mrmal 13-51 mL) LV SV: 50mL 41mmal 33-97 mL) CO: 5.0L/min (Normal 2.7-6.0 L/min) Indexed volumes: LV EDV: 50mL/s86m(Normal 41-81 mL/sq-m) LV ESV: 29mL/sq33mNormal 12-21 mL/sq-m) LV SV: 21mL/sq-12mormal 26-56 mL/sq-m) CI: 2.1L/min/sq-m (Normal 1.8-3.8 L/min/sq-m) Right ventricle: RV volumes were not able to be calculated as several basal short axis slices were missing from the data set. Visually RV appears normal size with mild systolic dysfunction (apical akinesis) Left atrium: Mild enlargement Right atrium: Normal size Mitral valve: Mild regurgitation Aortic valve: No regurgitation Tricuspid valve: No regurgitation Pulmonic valve: No regurgitation Aorta: Normal proximal ascending aorta Pericardium: Moderate effusion, measures up to 12mm adja96m to LV inferior wall IMPRESSION: 1. Findings consistent with acute myocarditis including markedly elevated native T1 (up to 1447ms), T2 70mto 76 ms), and ECV (up to 64%). In addition there is extensive/diffuse late gadolinium enhancement. 2. While clearly has acute myocarditis, ECV is higher and LGE more extensive than typically seen in myocarditis. This could just be due to severe inflammation from myocarditis, or could have an underlying cardiomyopathy (does have severe asymmetric LVH that was present on prior echo 03/2019, and given ECV/LGE pattern, underlying amyloid is a consideration) with myocarditis in addition to this. If having ventricular arrhythmias, high degree AV block, or hemodynamic instability, would consider endomyocardial biopsy for further evaluation.  3. Normal LV size, severe asymmetric hypertrophy measuring up to 22mm in bas30meptum (15mm in post60mr wall), and mild systolic dysfunction (EF 41%). Unfortu67%ely basal most LV short  axis slice was not included in the data set to calculate EF, which introduces a small amount of error in the EF calculation, though visually EF appears 40-45% 4. RV volumes were not able to be calculated as several basal short axis slices were missing from the data set. Visually RV appears normal size with mild systolic dysfunction (apical akinesis) 5. Moderate pericardial effusion, measures up to 76m adjacent to LV inferior wall Electronically Signed   By: COswaldo MilianMD   On: 05/15/2020 21:14    Cardiac Studies   Cath films personally reviewed 05/17/2020 normal coronary arteries no obstruction   Patient Profile     65y.o. female admitted with dyspnea, elevated troponins, abnormal ECG and anemia Cath with no CAD  Assessment & Plan    1. Myocarditis.:  Reviewed MRI and marked gadolinium uptake in diffuse pattern She is on colchicine NSAI's relatively contraindicated with myocardial involvement ESR. > 103 , CRP 8.1, Troponin > 27,000 No arrhythmias EF 41% by MRI continue beta blocker for relative tachycardia Add low dose ARB since her BP is better  Lasix x1 today as I/O's positive  Viral panel is pending  Discussed with DB and reviewed literature and no role for steroids . Giant cell arteritis less likely with no arrhythmia   2. Anemia:  With inflammation ferritin falls in normal range Per hospitalist IV iron given and one unit blood yesterday With good bump in Hb to over 9   Possible d/c in am   For questions or updates, please contact [facility] Please consult [email or web address] for contact info under        Signed, PJenkins Rouge MD  05/17/2020, 8:23 AM  \

## 2020-05-18 DIAGNOSIS — I400 Infective myocarditis: Principal | ICD-10-CM

## 2020-05-18 DIAGNOSIS — I214 Non-ST elevation (NSTEMI) myocardial infarction: Secondary | ICD-10-CM | POA: Diagnosis not present

## 2020-05-18 DIAGNOSIS — D649 Anemia, unspecified: Secondary | ICD-10-CM | POA: Diagnosis not present

## 2020-05-18 DIAGNOSIS — I514 Myocarditis, unspecified: Secondary | ICD-10-CM

## 2020-05-18 LAB — CBC
HCT: 31.4 % — ABNORMAL LOW (ref 36.0–46.0)
Hemoglobin: 10.1 g/dL — ABNORMAL LOW (ref 12.0–15.0)
MCH: 21.3 pg — ABNORMAL LOW (ref 26.0–34.0)
MCHC: 32.2 g/dL (ref 30.0–36.0)
MCV: 66.1 fL — ABNORMAL LOW (ref 80.0–100.0)
Platelets: 390 10*3/uL (ref 150–400)
RBC: 4.75 MIL/uL (ref 3.87–5.11)
RDW: 22.1 % — ABNORMAL HIGH (ref 11.5–15.5)
WBC: 7.5 10*3/uL (ref 4.0–10.5)
nRBC: 0 % (ref 0.0–0.2)

## 2020-05-18 LAB — EPSTEIN-BARR VIRUS VCA, IGM: EBV VCA IgM: <36 U/mL (ref 0.0–35.9)

## 2020-05-18 LAB — GLUCOSE, CAPILLARY
Glucose-Capillary: 98 mg/dL (ref 70–99)
Glucose-Capillary: 91 mg/dL (ref 70–99)
Glucose-Capillary: 99 mg/dL (ref 70–99)
Glucose-Capillary: 102 mg/dL — ABNORMAL HIGH (ref 70–99)

## 2020-05-18 LAB — EPSTEIN-BARR VIRUS VCA, IGG: EBV VCA IgG: >600 U/mL — ABNORMAL HIGH (ref 0.0–17.9)

## 2020-05-18 LAB — CMV IGM: CMV IgM: <30 AU/mL (ref 0.0–29.9)

## 2020-05-18 MED ORDER — METOPROLOL SUCCINATE ER 25 MG PO TB24
25.0000 mg | ORAL_TABLET | Freq: Every day | ORAL | Status: DC
  Administered 2020-05-18: 25 mg via ORAL
  Filled 2020-05-18: qty 1

## 2020-05-18 NOTE — TOC Benefit Eligibility Note (Signed)
Transition of Care [facility]) Benefit Eligibility Note    Patient Details  Name: Monique Gomez MRN: [id number] Date of Birth: [birth year]   Medication/Dose: colchicine 0.6mg  qd        Prescription Coverage Preferred Pharmacy: most major retail pharmacies  Spoke with Person/Company/Phone Number:: [email or web address]  Co-Pay: $35 for 30 day retail  Prior Approval: No          Delorse Lek Phone Number: 05/18/2020, 1:21 PM

## 2020-05-18 NOTE — Progress Notes (Addendum)
Hemoglobin is stable s/p IV Fe x 1 and 1 unit PRBC.  Patient has hematologist at [facility].  May need periodic IV Fe infusions and she has been taking PO w/o improvement.  Will sign off- please contact with questions. -has already been evaluated for thalassemia Monique Bear DO

## 2020-05-18 NOTE — Plan of Care (Signed)
?  Problem: Education: ?Goal: Knowledge of General Education information will improve ?Description: Including pain rating scale, medication(s)/side effects and non-pharmacologic comfort measures ?Outcome: Progressing ?  ?Problem: Health Behavior/Discharge Planning: ?Goal: Ability to manage health-related needs will improve ?Outcome: Progressing ?  ?Problem: Clinical Measurements: ?Goal: Ability to maintain clinical measurements within normal limits will improve ?Outcome: Progressing ?Goal: Will remain free from infection ?Outcome: Progressing ?Goal: Cardiovascular complication will be avoided ?Outcome: Progressing ?  ?Problem: Nutrition: ?Goal: Adequate nutrition will be maintained ?Outcome: Progressing ?  ?Problem: Coping: ?Goal: Level of anxiety will decrease ?Outcome: Progressing ?  ?Problem: Elimination: ?Goal: Will not experience complications related to bowel motility ?Outcome: Progressing ?Goal: Will not experience complications related to urinary retention ?Outcome: Progressing ?  ?Problem: Pain Managment: ?Goal: General experience of comfort will improve ?Outcome: Progressing ?  ?Problem: Safety: ?Goal: Ability to remain free from injury will improve ?Outcome: Progressing ?  ?Problem: Skin Integrity: ?Goal: Risk for impaired skin integrity will decrease ?Outcome: Progressing ?  ?

## 2020-05-18 NOTE — Progress Notes (Addendum)
Progress Note  Patient Name: Monique Gomez Date of Encounter: 05/18/2020  [facility] Cardiologist: Rozann Lesches, MD   Subjective   Breathing improving significantly. No chest pain.   Inpatient Medications    Scheduled Meds: . sodium chloride   Intravenous Once  . aspirin EC  81 mg Oral Daily  . atorvastatin  80 mg Oral q1800  . colchicine  0.6 mg Oral Daily  . enoxaparin (LOVENOX) injection  40 mg Subcutaneous Q24H  . fluticasone  2 spray Each Nare Daily  . influenza vac split quadrivalent PF  0.5 mL Intramuscular Tomorrow-1000  . insulin aspart  0-15 Units Subcutaneous TID WC  . ipratropium  2 spray Each Nare Q12H  . losartan  25 mg Oral Daily  . metoprolol tartrate  12.5 mg Oral BID  . pantoprazole  40 mg Oral BID  . sodium chloride flush  3 mL Intravenous Q12H  . sodium chloride flush  3 mL Intravenous Q12H  . topiramate  75 mg Oral QHS   Continuous Infusions: . sodium chloride    . sodium chloride     PRN Meds: sodium chloride, acetaminophen, nitroGLYCERIN, ondansetron (ZOFRAN) IV, oxyCODONE, sodium chloride flush   Vital Signs    Vitals:   05/17/20 1508 05/17/20 2154 05/17/20 2155 05/18/20 0612  BP: 93/68 101/79 101/79 122/90  Pulse: 93 82 82 95  Resp: 20  18 18   Temp: 98 F (36.7 C)  98.1 F (36.7 C) 98.1 F (36.7 C)  TempSrc: Oral  Oral Oral  SpO2: 99%  99% 100%  Weight:    124.5 kg  Height:        Intake/Output Summary (Last 24 hours) at 05/18/2020 0901 Last data filed at 05/17/2020 2200 Gross per 24 hour  Intake 960 ml  Output 1000 ml  Net -40 ml   Last 3 Weights 05/18/2020 05/17/2020 05/15/2020  Weight (lbs) 274 lb 8 oz 275 lb 9.2 oz 288 lb 12.8 oz  Weight (kg) 124.512 kg 125 kg 131 kg      Telemetry    SR at 90s, intermittent tachycardia at 120s - Personally Reviewed  ECG    N/A  Physical Exam   GEN: Obese female in no acute distress.   Neck: No JVD Cardiac: RRR, no murmurs, rubs, or gallops.  Respiratory: Clear to  auscultation bilaterally. GI: Soft, nontender, non-distended  MS: No edema; No deformity. Neuro:  Nonfocal  Psych: Normal affect   Labs    High Sensitivity Troponin:   Recent Labs  Lab 05/13/20 1005 05/13/20 1221  TROPONINIHS >27,000* >27,000*      Chemistry Recent Labs  Lab 05/13/20 1005 05/14/20 0052 05/14/20 0741 05/15/20 0456 05/17/20 0223  NA 134*   < > 137 134* 142  K 3.0*   < > 4.4 3.6 3.7  CL 98   < > 107 105 108  CO2 23   < > 19* 19* 23  GLUCOSE 143*   < > 99 100* 161*  BUN 24*   < > 20 15 15   CREATININE 1.39*   < > 0.99 0.92 1.05*  CALCIUM 9.0   < > 8.7* 8.5* 8.9  PROT 7.9  --   --   --   --   ALBUMIN 3.4*  --   --   --   --   AST 166*  --   --   --   --   ALT 41  --   --   --   --  ALKPHOS 59  --   --   --   --   BILITOT 0.6  --   --   --   --   GFRNONAA 43*   < > >60 >60 60*  ANIONGAP 13   < > 11 10 11    < > = values in this interval not displayed.     Hematology Recent Labs  Lab 05/16/20 0107 05/17/20 0223 05/18/20 0236  WBC 7.1 5.8 7.5  RBC 3.75* 4.71 4.75  HGB 7.9* 9.9* 10.1*  HCT 24.7* 31.7* 31.4*  MCV 65.9* 67.3* 66.1*  MCH 21.1* 21.0* 21.3*  MCHC 32.0 31.2 32.2  RDW 19.6* 21.8* 22.1*  PLT 305 332 390    BNP Recent Labs  Lab 05/13/20 1005  BNP 1,175.0*     Radiology    No results found.  Cardiac Studies   Cardiac MRI  05/15/20 IMPRESSION: 1. Findings consistent with acute myocarditis including markedly elevated native T1 (up to 1463ms), T2 (up to 76 ms), and ECV (up to 64%). In addition there is extensive/diffuse late gadolinium enhancement.  2. While clearly has acute myocarditis, ECV is higher and LGE more extensive than typically seen in myocarditis. This could just be due to severe inflammation from myocarditis, or could have an underlying cardiomyopathy (does have severe asymmetric LVH that was present on prior echo 03/2019, and given ECV/LGE pattern, underlying amyloid is a consideration) with myocarditis in  addition to this. If having ventricular arrhythmias, high degree AV block, or hemodynamic instability, would consider endomyocardial biopsy for further evaluation.  3. Normal LV size, severe asymmetric hypertrophy measuring up to 60mm in basal septum (40mm in posterior wall), and mild systolic dysfunction (EF 82%). Unfortunately basal most LV short axis slice was not included in the data set to calculate EF, which introduces a small amount of error in the EF calculation, though visually EF appears 40-45%  4. RV volumes were not able to be calculated as several basal short axis slices were missing from the data set. Visually RV appears normal size with mild systolic dysfunction (apical akinesis)  5. Moderate pericardial effusion, measures up to 25mm adjacent to LV inferior wall    LEFT HEART CATH AND CORONARY ANGIOGRAPHY  05/14/20    Conclusion   Widely patent/normal widely patent coronary arteries.  Right dominant anatomy  Regional wall motion abnormality noted on left ventriculography. EF 40 to 50%.  RECOMMENDATIONS:   Given context of clinical presentation, myopericarditis is now the leading diagnosis in the differential. This presentation is not atypical stress cardiomyopathy. Recommend MRI/cardiac.    Echo 05/13/2020 1. Left ventricular ejection fraction, by estimation, is 50%. The left  ventricle has normal function. Left ventricular endocardial border not  optimally defined to evaluate regional wall motion. There is moderate left  ventricular hypertrophy. Left  ventricular diastolic parameters are indeterminate.  2. Right ventricular systolic function is normal. The right ventricular  size is normal.  3. There is indentation of the RA without collapse, no RV collapse.  Borderline MV respiratory variation of 20%. Normal IVC. Overall findings  are not consistent with tamponade physiology. Would repeat study 48 hours.  Marland Kitchen a small pericardial effusion is   present. The pericardial effusion is circumferential. There is no evidence  of cardiac tamponade.  4. The mitral valve is normal in structure. No evidence of mitral valve  regurgitation. No evidence of mitral stenosis.  5. The aortic valve has an indeterminant number of cusps. Aortic valve  regurgitation is not visualized.  No aortic stenosis is present.  6. The inferior vena cava is normal in size with greater than 50%  respiratory variability, suggesting right atrial pressure of 3 mmHg.   Patient Profile     65 y.o. female with history of PAF, asthma, HTN, atypical chest pain history without significant CAD by cath 2015, OSA, DM2 transferred from [facility] for elevated troponin. Recent viral illness.   Assessment & Plan    1. NSTEMI/ Acute myocarditis - EKG with new RBBB, inferior and lateral precordial leads without reciprocal changes. Troponin > 27,000. Cancelled initial code STEMI. Treated with heparin. Cath with normal coronaries. Elevated CRP and sed rate. Cardiac MRI consistent with acute myocarditis with ? Amyloid. Treated with Colchicine. NSAIDS contra indicated with myocardial involvement.   2. Acuate systolic and diastolic CHF - Echo with LVEF of 50% and grade II DD. Cardiac MRI with LVEF of 41%. - She is intermittently tachycardic - Given Low EF, change metoprolol to Toprol XL - Continue ARB - Titrate heart failure medication as BP allows  3. Anemia - Consistent with IDA - Appreciate IM recommendations - S/p Iron infusion and 1 unit or PRBCs - ANA was positive. Previously seen by hemotology  4. OSA - On CPAP at home  5. PAF - Maintaining sinus rhythm. Intermittent tachycardia yesterday, rhythm looks sinus - Eliquis on hold >> treated with Lovenox  - Plan to resume Eliquis per MD  For questions or updates, please contact [facility] Please consult [email or web address] for contact info under      SignedLeanor Kail, PA  05/18/2020, 9:01 AM     Patient seen  and examined. Agree with assessment and plan. Cardiac MRI confirms suspicion for myocarditis with  extensive/diffuse late gadolinium enhancement. Breathing has improved. Patient remains tachycardic with HR 101, sinus rhythm.  Agree with change to metoprolol succinate 25 mg today and titrate.  Will increase losartan to 25 mg bid. Renal function improved from 1.39. With MCV 66 consider also an evaluation for possible thallasemia or sickle trait if remains low despite Fe supplementation. Will keep ion hospital today for med titration with possible DC tomorrow if stable.   Troy Sine, MD, [facility] 05/18/2020 10:22 AM

## 2020-05-18 NOTE — Care Management (Signed)
0918 05-18-20 Benefits check submitted for colchicine. Case Manager will follow for cost. Graves-Bigelow, Ocie Cornfield, RN, BSN Case Manager

## 2020-05-19 ENCOUNTER — Other Ambulatory Visit (HOSPITAL_COMMUNITY): Payer: Self-pay | Admitting: Interventional Cardiology

## 2020-05-19 DIAGNOSIS — E669 Obesity, unspecified: Secondary | ICD-10-CM

## 2020-05-19 LAB — CBC
HCT: 30.9 % — ABNORMAL LOW (ref 36.0–46.0)
Hemoglobin: 9.6 g/dL — ABNORMAL LOW (ref 12.0–15.0)
MCH: 21.3 pg — ABNORMAL LOW (ref 26.0–34.0)
MCHC: 31.1 g/dL (ref 30.0–36.0)
MCV: 68.7 fL — ABNORMAL LOW (ref 80.0–100.0)
Platelets: 373 10*3/uL (ref 150–400)
RBC: 4.5 MIL/uL (ref 3.87–5.11)
RDW: 22.8 % — ABNORMAL HIGH (ref 11.5–15.5)
WBC: 6.6 10*3/uL (ref 4.0–10.5)
nRBC: 0 % (ref 0.0–0.2)

## 2020-05-19 LAB — GLUCOSE, CAPILLARY
Glucose-Capillary: 121 mg/dL — ABNORMAL HIGH (ref 70–99)
Glucose-Capillary: 97 mg/dL (ref 70–99)

## 2020-05-19 MED ORDER — COLCHICINE 0.6 MG PO TABS: 0.6000 mg | ORAL_TABLET | Freq: Every day | ORAL | 0 refills | Status: DC

## 2020-05-19 MED ORDER — METOPROLOL SUCCINATE ER 25 MG PO TB24: 37.5000 mg | ORAL_TABLET | Freq: Every day | ORAL | 1 refills | Status: DC

## 2020-05-19 MED ORDER — LOSARTAN POTASSIUM 25 MG PO TABS: 25.0000 mg | ORAL_TABLET | Freq: Two times a day (BID) | ORAL | 6 refills | Status: DC

## 2020-05-19 MED ORDER — APIXABAN 5 MG PO TABS: 5.0000 mg | ORAL_TABLET | Freq: Two times a day (BID) | ORAL | Status: DC

## 2020-05-19 MED ORDER — METOPROLOL SUCCINATE ER 25 MG PO TB24
37.5000 mg | ORAL_TABLET | Freq: Every day | ORAL | Status: DC
  Administered 2020-05-19: 37.5 mg via ORAL
  Filled 2020-05-19: qty 2

## 2020-05-19 MED FILL — LOSARTAN POTASSIUM 25 MG TA: 25 | 30 days supply | Qty: 60 | Fill #0

## 2020-05-19 MED FILL — COLCHICINE 0.6 MG TABS: 0.6 | 30 days supply | Qty: 30 | Fill #0

## 2020-05-19 MED FILL — METOPROLOL SUCCINATE ER 25: 25 | 30 days supply | Qty: 45 | Fill #0

## 2020-05-19 NOTE — Progress Notes (Signed)
Cardiology Office Note    Date:  05/26/2020   ID:  Monique Gomez, Monique Gomez 02/14/1956, MRN [id number]  PCP:  Alycia Rossetti, MD  Cardiologist: Rozann Lesches, MD EPS: None  Chief Complaint  Patient presents with  . Follow-up  . Hospitalization Follow-up    History of Present Illness:  Monique Gomez is a 65 y.o. female with PMH of PAF, asthma, HTN, atypical chest pain history without significant CAD by cath 2015, OSA, DM2 who presented to hosptital 05/15/19 with nausea, vomiting, fatigue, and chest pain. Nausea, vomiting, diarrhea, fatigue, chills, fevers. Covid negative. EKG ST elevation inflat. Trop >27,000. Cath normal coronary arteries, elevated CRP and sed rate. Cardiac MRI consistent with acute myocarditis with ? Amyloid. Treated with colchicine. NSAIDS contraindicated with myocardial involvement.LVEF 50% on echo 41% on MRI.  Patient comes in for f/u. Very fatigued. Just getting here was such a chore. HR goes fast with little activity. BP also up today.      Past Medical History:  Diagnosis Date  . Abdominal fibromatosis   . Allergic rhinitis   . Anemia   . Asthma   . Essential hypertension   . GERD (gastroesophageal reflux disease)   . Headache(784.0)   . History of cardiac catheterization    No significant CAD May 2015  . History of migraine headaches   . Hyperlipidemia   . Migraines   . Obesity   . PAF (paroxysmal atrial fibrillation) ([place]) 08/2013  . Sleep apnea   . Type 2 diabetes mellitus ([place])     Past Surgical History:  Procedure Laterality Date  . CATARACT EXTRACTION    . CATARACT EXTRACTION W/PHACO Right 04/05/2019   Procedure: CATARACT EXTRACTION PHACO AND INTRAOCULAR LENS PLACEMENT RIGHT EYE (CDE: 3.19);  Surgeon: Baruch Goldmann, MD;  Location: [facility];  Service: Ophthalmology;  Laterality: Right;  . COLONOSCOPY N/A 02/02/2015   SLF: 1. one colon polyp removed-no source for anemia identified. 2. moderate diverticulosis noted in the sigmoid colon  and descending colon 3. the left colon is redundant 4. Rectal bleeding due ot small internal hemorroids 5. Moderate sized external hemorrhoids.  . ESOPHAGOGASTRODUODENOSCOPY N/A 02/02/2015   SLF: 1. 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 large hiatal hernia 3. mild non-erosive gastritis 4. No source for anemia identified.   Marland Kitchen LEFT HEART CATH AND CORONARY ANGIOGRAPHY N/A 05/14/2020   Procedure: LEFT HEART CATH AND CORONARY ANGIOGRAPHY;  Surgeon: Belva Crome, MD;  Location: [facility];  Service: Cardiovascular;  Laterality: N/A;  . LEFT HEART CATHETERIZATION WITH CORONARY ANGIOGRAM N/A 09/03/2013   Procedure: LEFT HEART CATHETERIZATION WITH CORONARY ANGIOGRAM;  Surgeon: Jettie Booze, MD;  Location: [facility];  Service: Cardiovascular;  Laterality: N/A;  . Right carpal tunnel release      Current Medications: Current Meds  Medication Sig  . acetaminophen (TYLENOL) 500 MG tablet Take 1 tablet (500 mg total) by mouth every 6 (six) hours as needed.  Marland Kitchen AIMOVIG 70 MG/ML SOAJ INJECT 70 MG INTO THE SKIN EVERY 30 DAYS  . atorvastatin (LIPITOR) 80 MG tablet Take 1 tablet by mouth once daily  . colchicine 0.6 MG tablet Take 1 tablet (0.6 mg total) by mouth daily.  . cyclobenzaprine (FLEXERIL) 5 MG tablet Take 1 tablet (5 mg total) by mouth 3 (three) times daily as needed.  Marland Kitchen ELIQUIS 5 MG TABS tablet Take 1 tablet by mouth twice daily  . fluticasone (FLONASE) 50 MCG/ACT nasal spray Place 2 sprays into both nostrils  daily.  . ipratropium (ATROVENT) 0.03 % nasal spray Place 2 sprays into both nostrils every 12 (twelve) hours.  . Iron-FA-B Cmp-C-Biot-Probiotic (FUSION PLUS) CAPS Take 2 tablets by mouth daily.  Marland Kitchen losartan (COZAAR) 25 MG tablet Take 1 tablet (25 mg total) by mouth in the morning and at bedtime.  . metFORMIN (GLUCOPHAGE) 500 MG tablet TAKE 1 TABLET BY MOUTH TWICE DAILY WITH A MEAL  . metoCLOPramide (REGLAN) 10 MG tablet Take 1 tablet (10 mg total) by mouth every 8  (eight) hours as needed for up to 7 days for nausea (Headache).  . metoprolol succinate (TOPROL-XL) 25 MG 24 hr tablet Take 1 tablet (25 mg total) by mouth in the morning and at bedtime. Take with or immediately following a meal.  . ondansetron (ZOFRAN ODT) 4 MG disintegrating tablet Take 1 tablet (4 mg total) by mouth every 8 (eight) hours as needed.  . pantoprazole (PROTONIX) 40 MG tablet Take 1 tablet by mouth twice daily  . Semaglutide 7 MG TABS Take 7 mg by mouth daily before breakfast.  . topiramate (TOPAMAX) 50 MG tablet Take 1.5 tablets (75 mg total) by mouth at bedtime.  Marland Kitchen Ubrogepant (UBRELVY) 50 MG TABS Take 1 tablet at the onset of a migraine.  Can repeat in 2 hours if needed.  . [DISCONTINUED] metoprolol succinate (TOPROL-XL) 25 MG 24 hr tablet Take 1.5 tablets (37.5 mg total) by mouth daily.  . [DISCONTINUED] SUMAtriptan (IMITREX) 6 MG/0.5ML SOLN injection Inject 0.5 mLs (6 mg total) into the skin every 2 (two) hours as needed for migraine or headache. May repeat in 2 hours if headache persists or recurs.     Allergies:   Venlafaxine   Social History   Socioeconomic History  . Marital status: Single    Spouse name: Not on file  . Number of children: 2  . Years of education: Not on file  . Highest education level: Not on file  Occupational History  . Occupation: Training and development officer: [employer]  Tobacco Use  . Smoking status: Former Smoker    Packs/day: 0.30    Types: Cigarettes    Quit date: 04/01/2012    Years since quitting: 8.1  . Smokeless tobacco: Never Used  Vaping Use  . Vaping Use: Never used  Substance and Sexual Activity  . Alcohol use: Yes    Alcohol/week: 0.0 standard drinks    Comment: very occasional  . Drug use: No  . Sexual activity: Yes  Other Topics Concern  . Not on file  Social History Narrative  . Not on file   Social Determinants of Health   Financial Resource Strain: Not on file  Food Insecurity: Not on file  Transportation  Needs: Not on file  Physical Activity: Not on file  Stress: Not on file  Social Connections: Not on file     Family History:  The patient's family history includes Alzheimer's disease in her father; Arthritis in her sister; Developmental delay in her son; Diabetes in her brother, mother, and son; Heart disease in her mother; High Cholesterol in her mother and sister; Hypertension in her brother, mother, sister, and sister.   ROS:   Please see the history of present illness.    ROS All other systems reviewed and are negative.   PHYSICAL EXAM:   VS:  BP (!) 150/92   Pulse (!) 110   Ht 5\' 2"  (1.575 m)   Wt 274 lb (124.3 kg)   SpO2 97%  BMI 50.12 kg/m   Physical Exam  GEN: Obese, in no acute distress  Neck: no JVD, carotid bruits, or masses Cardiac:RRR; no murmurs, rubs, or gallops  Respiratory:  clear to auscultation bilaterally, normal work of breathing GI: soft, nontender, nondistended, + BS Ext: without cyanosis, clubbing, or edema, Good distal pulses bilaterally Neuro:  Alert and Oriented x 3 Psych: euthymic mood, full affect  Wt Readings from Last 3 Encounters:  05/26/20 274 lb (124.3 kg)  05/19/20 275 lb 3.2 oz (124.8 kg)  04/16/20 280 lb (127 kg)      Studies/Labs Reviewed:   EKG:  EKG is not  ordered today.   Recent Labs: 10/15/2019: Magnesium 1.8 05/13/2020: ALT 41; B Natriuretic Peptide 1,175.0; TSH 3.035 05/17/2020: BUN 15; Creatinine, Ser 1.05; Potassium 3.7; Sodium 142 05/19/2020: Hemoglobin 9.6; Platelets 373   Lipid Panel    Component Value Date/Time   CHOL 162 05/13/2020 1005   TRIG 156 (H) 05/13/2020 1005   HDL 24 (L) 05/13/2020 1005   CHOLHDL 6.8 05/13/2020 1005   VLDL 31 05/13/2020 1005   LDLCALC 107 (H) 05/13/2020 1005   LDLCALC 107 (H) 12/18/2019 [id number]    Additional studies/ records that were reviewed today include:  Cath: 05/14/20    Widely patent/normal widely patent coronary arteries.  Right dominant anatomy  Regional wall motion  abnormality noted on left ventriculography. EF 40 to 50%.   RECOMMENDATIONS:    Given context of clinical presentation, myopericarditis is now the leading diagnosis in the differential. This presentation is not atypical stress cardiomyopathy. Recommend MRI/cardiac.    Echo 05/13/20 IMPRESSIONS     1. Left ventricular ejection fraction, by estimation, is 50%. The left  ventricle has normal function. Left ventricular endocardial border not  optimally defined to evaluate regional wall motion. There is moderate left  ventricular hypertrophy. Left  ventricular diastolic parameters are indeterminate.   2. Right ventricular systolic function is normal. The right ventricular  size is normal.   3. There is indentation of the RA without collapse, no RV collapse.  Borderline MV respiratory variation of 20%. Normal IVC. Overall findings  are not consistent with tamponade physiology. Would repeat study 48 hours.  Marland Kitchen a small pericardial effusion is  present. The pericardial effusion is circumferential. There is no evidence  of cardiac tamponade.   4. The mitral valve is normal in structure. No evidence of mitral valve  regurgitation. No evidence of mitral stenosis.   5. The aortic valve has an indeterminant number of cusps. Aortic valve  regurgitation is not visualized. No aortic stenosis is present.   6. The inferior vena cava is normal in size with greater than 50%  respiratory variability, suggesting right atrial pressure of 3 mmHg.   Cardiac MRI 05/15/20 IMPRESSION: 1. Findings consistent with acute myocarditis including markedly elevated native T1 (up to 1479ms), T2 (up to 76 ms), and ECV (up to 64%). In addition there is extensive/diffuse late gadolinium enhancement.   2. While clearly has acute myocarditis, ECV is higher and LGE more extensive than typically seen in myocarditis. This could just be due to severe inflammation from myocarditis, or could have an underlying cardiomyopathy  (does have severe asymmetric LVH that was present on prior echo 03/2019, and given ECV/LGE pattern, underlying amyloid is a consideration) with myocarditis in addition to this. If having ventricular arrhythmias, high degree AV block, or hemodynamic instability, would consider endomyocardial biopsy for further evaluation.   3. Normal LV size, severe asymmetric hypertrophy measuring up to 40mm  in basal septum (43mm in posterior wall), and mild systolic dysfunction (EF 30%). Unfortunately basal most LV short axis slice was not included in the data set to calculate EF, which introduces a small amount of error in the EF calculation, though visually EF appears 40-45%   4. RV volumes were not able to be calculated as several basal short axis slices were missing from the data set. Visually RV appears normal size with mild systolic dysfunction (apical akinesis)   5. Moderate pericardial effusion, measures up to 31mm adjacent to LV inferior wall     Electronically Signed   By: Oswaldo Milian MD   On: 05/15/2020 21:14    Risk Assessment/Calculations:    CHA2DS2-VASc Score = 3  This indicates a 3.2% annual risk of stroke. The patient's score is based upon: CHF History: Yes HTN History: Yes Diabetes History: No Stroke History: No Vascular Disease History: No Age Score: 0 Gender Score: 1        ASSESSMENT:    1. Acute viral myocarditis   2. Chronic combined systolic and diastolic CHF (congestive heart failure) (HCC)   3. Paroxysmal atrial fibrillation ([place])   4. OSA on CPAP   5. Other iron deficiency anemia   6. Essential hypertension      PLAN:  In order of problems listed above:    NSTEMI/ Acute myocarditis: EKG with new RBBB, inferior and lateral precordial leads without reciprocal changes. Troponin > 27,000. Cancelled initial code STEMI. Treated with heparin. Cath with normal coronaries. Elevated CRP and sed rate. Cardiac MRI consistent with acute myocarditis  with ? Amyloid. Treated with Colchicine with significant improvement. NSAIDS contra indicated with myocardial involvement.  No further chest pain but very weak and fatigued. Continue colchicine   Chronic systolic and diastolic CHF: Echo with LVEF of 50% and grade II DD. Cardiac MRI with LVEF of 41%.  BB therapy. increase Toprol XL  25mg  BID, losartan 25mg  daily  PAF: Maintaining sinus rhythm. but tachycardic with little activity. Increase toprol xl 25 mg bid Eliquis resumed at discharge. no bleeding problems   Anemia: Consistent with IDA-- S/p Iron infusion and 1 unit or PRBCs-- ANA was positive. Previously seen by hematology, she needs to call today to schedule today to schedule    OSA:  On CPAP at home  HTN-BP up today. Increase toprol today but may need to increase losartan. Close f/u     Shared Decision Making/Informed Consent        Medication Adjustments/Labs and Tests Ordered: Current medicines are reviewed at length with the patient today.  Concerns regarding medicines are outlined above.  Medication changes, Labs and Tests ordered today are listed in the Patient Instructions below. Patient Instructions  Medication Instructions:  Your physician has recommended you make the following change in your medication:   Metoprolol Succinate 25 mg Two Times Daily   *If you need a refill on your cardiac medications before your next appointment, please call your pharmacy*   Lab Work: NONE  If you have labs (blood work) drawn today and your tests are completely normal, you will receive your results only by: Marland Kitchen MyChart Message (if you have MyChart) OR . A paper copy in the mail If you have any lab test that is abnormal or we need to change your treatment, we will call you to review the results.   Testing/Procedures: NONE     Follow-Up: At [facility], you and your health needs are our priority.  As part of our  continuing mission to provide you with exceptional heart care, we  have created designated Provider Care Teams.  These Care Teams include your primary Cardiologist (physician) and Advanced Practice Providers (APPs -  Physician Assistants and Nurse Practitioners) who all work together to provide you with the care you need, when you need it.  We recommend signing up for the patient portal called "MyChart".  Sign up information is provided on this After Visit Summary.  MyChart is used to connect with patients for Virtual Visits (Telemedicine).  Patients are able to view lab/test results, encounter notes, upcoming appointments, etc.  Non-urgent messages can be sent to your provider as well.   To learn more about what you can do with MyChart, go to [email or web address].    Your next appointment:   2-3 week(s)  The format for your next appointment:   In Person  Provider:   You may see Rozann Lesches, MD or one of the following Advanced Practice Providers on your designated Care Team:    Bernerd Pho, PA-C   Ermalinda Barrios, PA-C     Other Instructions Thank you for choosing [facility]!       Sumner Boast, PA-C  05/26/2020 11:36 AM    [facility] [address], [place], [place]  [address] Phone: [phone]; Fax: [phone]

## 2020-05-19 NOTE — Discharge Summary (Signed)
Discharge Summary    Patient ID: Monique Gomez MRN: [id number]; DOB: [birth year]  Admit date: 05/13/2020 Discharge date: 05/19/2020  Primary Care Provider: Alycia Rossetti, MD  Primary Cardiologist: Rozann Lesches, MD  Primary Electrophysiologist:  None   Discharge Diagnoses    Principal Problem:   NSTEMI (non-ST elevated myocardial infarction) [facility]) Active Problems:   Hyperlipidemia   Class 3 obesity   Anemia   PAF (paroxysmal atrial fibrillation) ([place])   Obstructive sleep apnea   Acute viral myocarditis    Diagnostic Studies/Procedures    Cath: 05/14/20   Widely patent/normal widely patent coronary arteries.  Right dominant anatomy  Regional wall motion abnormality noted on left ventriculography. EF 40 to 50%.  RECOMMENDATIONS:   Given context of clinical presentation, myopericarditis is now the leading diagnosis in the differential. This presentation is not atypical stress cardiomyopathy. Recommend MRI/cardiac.   _____________   History of Present Illness     Monique Gomez is a 65 y.o. female with PMH of PAF, asthma, HTN, atypical chest pain history without significant CAD by cath 2015, OSA, DM2 who presented with nausea, vomiting, fatigue, and chest pain. Nausea, vomiting, diarrhea, fatigue, chills, fevers since Thursday. Overall poor oral intake over that time. She is vaccinated against covid x 2, COVID test here was negative. Starting Monday mild sharp chest pain midchest lasting just a few seconds, some SOB. On Tuesay prior to admission  9/10 fullness pain midchest into shoulder blades constant x 6-7 hours, not positional. Last dose of Eliquis was 1/11.   ER vitals: p 107 bp 109/94 100% RA K 3 Cr 1.39 BUN 24 WBC 14.5 Hgb 10.3 Plt 312 LDL 107 BNP 1175 hstrop >27,000 COVID neg EKG SR, RBBB, ST elevation inferior leads and lateral precordial with reciprocal changes CXR no acute process CT PE: no PE, no acute process 03/2019 echo: LVEF 70-75%,  moderate asymmetric septal hypertrophy, grad I diastolic dysfunction. Normal RV function.  There was initial concern for possible STEMI but this was cancelled and the patient evaluated by Dr. Harl Bowie at [facility]. Given degree of troponin elevation she was transferred to [facility] for further management.   Hospital Course     Consultants: TRH  1. NSTEMI/ Acute myocarditis: EKG with new RBBB, inferior and lateral precordial leads without reciprocal changes. Troponin > 27,000. Cancelled initial code STEMI. Treated with heparin. Cath with normal coronaries. Elevated CRP and sed rate. Cardiac MRI consistent with acute myocarditis with ? Amyloid. Treated with Colchicine with significant improvement. NSAIDS contra indicated with myocardial involvement.   2. Acuate systolic and diastolic CHF: Echo with LVEF of 50% and grade II DD. Cardiac MRI with LVEF of 41%. She was intermittently tachycardic but improved with the addition of BB therapy.  -- continue Toprol XL 37.5mg  daily, losartan 25mg  daily  3. Anemia: Consistent with IDA -- S/p Iron infusion and 1 unit or PRBCs -- ANA was positive. Previously seen by hematology, follow up arranged as an outpatient  4. OSA:  -- On CPAP at home  5. PAF: Maintaining sinus rhythm. Eliquis resumed at discharge.   Did the patient have an acute coronary syndrome (MI, NSTEMI, STEMI, etc) this admission?:  No.   The elevated Troponin was due to the acute medical illness (demand ischemia).      _____________  Discharge Vitals Blood pressure 110/81, pulse 90, temperature 98.1 F (36.7 C), temperature source Oral, resp. rate 18, height 5\' 2"  (1.575 m), weight 124.8 kg, SpO2 97 %.  Filed Weights  05/17/20 0645 05/18/20 0612 05/19/20 0641  Weight: 125 kg 124.5 kg 124.8 kg    Labs & Radiologic Studies    CBC Recent Labs    05/18/20 0236 05/19/20 0305  WBC 7.5 6.6  HGB 10.1* 9.6*  HCT 31.4* 30.9*  MCV 66.1* 68.7*  PLT 390 [id number]   Basic Metabolic  Panel Recent Labs    05/17/20 0223  NA 142  K 3.7  CL 108  CO2 23  GLUCOSE 161*  BUN 15  CREATININE 1.05*  CALCIUM 8.9   Liver Function Tests No results for input(s): AST, ALT, ALKPHOS, BILITOT, PROT, ALBUMIN in the last 72 hours. No results for input(s): LIPASE, AMYLASE in the last 72 hours. High Sensitivity Troponin:   Recent Labs  Lab 05/13/20 1005 05/13/20 1221  TROPONINIHS >27,000* >27,000*    BNP Invalid input(s): POCBNP D-Dimer No results for input(s): DDIMER in the last 72 hours. Hemoglobin A1C No results for input(s): HGBA1C in the last 72 hours. Fasting Lipid Panel No results for input(s): CHOL, HDL, LDLCALC, TRIG, CHOLHDL, LDLDIRECT in the last 72 hours. Thyroid Function Tests No results for input(s): TSH, T4TOTAL, T3FREE, THYROIDAB in the last 72 hours.  Invalid input(s): FREET3 _____________  CT Angio Chest PE W/Cm &/Or Wo Cm  Result Date: 05/13/2020 CLINICAL DATA:  Nausea, vomiting, weakness, and fatigue since Thursday, chest pressure, back pain and diarrhea since last night, fever to 100.5 degrees Thursday, history hypertension, GERD, type II diabetes mellitus, abdominal fibromatosis, atrial fibrillation, former smoker EXAM: CT ANGIOGRAPHY CHEST WITH CONTRAST TECHNIQUE: Multidetector CT imaging of the chest was performed using the standard protocol during bolus administration of intravenous contrast. Multiplanar CT image reconstructions and MIPs were obtained to evaluate the vascular anatomy. CONTRAST:  47mL OMNIPAQUE IOHEXOL 350 MG/ML SOLN IV COMPARISON:  None FINDINGS: Cardiovascular: Atherosclerotic calcifications aorta and coronary arteries. Minimal enlargement of heart with thickening of LV wall and septum consistent with LEFT ventricular hypertrophy. Minimal pericardial fluid. Pulmonary arteries adequately opacified and patent. No evidence of pulmonary embolism. Mediastinum/Nodes: Small hiatal hernia. Esophagus unremarkable. Base of cervical region normal  appearance. No thoracic adenopathy. Few normal sized mediastinal and axillary lymph nodes noted. Lungs/Pleura: Minimal subsegmental atelectasis in the lungs. Lungs otherwise clear. No pulmonary infiltrate, pleural effusion, or pneumothorax. No mass/nodule. Upper Abdomen: Visualized upper abdomen unremarkable Musculoskeletal: Unremarkable Review of the MIP images confirms the above findings. IMPRESSION: No evidence of pulmonary embolism. Minimal subsegmental atelectasis in the lungs. Small hiatal hernia. Aortic Atherosclerosis (ICD10-I70.0). Electronically Signed   By: Lavonia Dana M.D.   On: 05/13/2020 11:50   CARDIAC CATHETERIZATION  Result Date: 05/14/2020  Widely patent/normal widely patent coronary arteries.  Right dominant anatomy  Regional wall motion abnormality noted on left ventriculography. EF 40 to 50%. RECOMMENDATIONS:  Given context of clinical presentation, myopericarditis is now the leading diagnosis in the differential. This presentation is not atypical stress cardiomyopathy. Recommend MRI/cardiac.  DG Chest Port 1 View  Result Date: 05/13/2020 CLINICAL DATA:  Pain and fatigue.  Recent fever EXAM: PORTABLE CHEST 1 VIEW COMPARISON:  February 12, 2019 FINDINGS: There is no edema or airspace opacity. Heart is mildly enlarged with pulmonary vascular normal. There is prominent epicardial fat along the left heart border. No adenopathy. No pneumothorax. No bone lesions. IMPRESSION: Stable cardiac prominence.  No edema or airspace opacity. Electronically Signed   By: Lowella Grip III M.D.   On: 05/13/2020 10:45   MR CARDIAC MORPHOLOGY W WO CONTRAST  Result Date: 05/15/2020 CLINICAL DATA:  67F with  OSA, DM, Afib, morbid obesity, HLD, HTN presented with NSTEMI, hsTn >27K. Echo with EF 50%. Cath with normal coronary arteries, EF 40-50%. Evaluate for myocarditis EXAM: CARDIAC MRI TECHNIQUE: The patient was scanned on a 1.5 Tesla Siemens magnet. A dedicated cardiac coil was used. Functional  imaging was done using Fiesta sequences. 2,3, and 4 chamber views were done to assess for RWMA's. Modified Simpson's rule using a short axis stack was used to calculate an ejection fraction on a dedicated work Conservation officer, nature. The patient received 10 cc of Gadavist. After 10 minutes inversion recovery sequences were used to assess for infiltration and scar tissue. CONTRAST:  10 cc  of Gadavist FINDINGS: Left ventricle: -Severe asymmetric hypertrophy measuring up to 20mm in basal septum (61mm in posterior wall) -Normal size -Mildly reduced systolic function -Markedly elevated native T1 (up to 142ms), T2 (up to 76 ms), and ECV (up to 64%) -Extensive diffuse LGE throughout myocardium LV EF:  41% (Normal 56-78%) Absolute volumes: LV EDV: 151mL (Normal 52-141 mL) LV ESV: 31mL (Normal 13-51 mL) LV SV: 77mL (Normal 33-97 mL) CO: 5.0L/min (Normal 2.7-6.0 L/min) Indexed volumes: LV EDV: 7mL/sq-m (Normal 41-81 mL/sq-m) LV ESV: 57mL/sq-m (Normal 12-21 mL/sq-m) LV SV: 83mL/sq-m (Normal 26-56 mL/sq-m) CI: 2.1L/min/sq-m (Normal 1.8-3.8 L/min/sq-m) Right ventricle: RV volumes were not able to be calculated as several basal short axis slices were missing from the data set. Visually RV appears normal size with mild systolic dysfunction (apical akinesis) Left atrium: Mild enlargement Right atrium: Normal size Mitral valve: Mild regurgitation Aortic valve: No regurgitation Tricuspid valve: No regurgitation Pulmonic valve: No regurgitation Aorta: Normal proximal ascending aorta Pericardium: Moderate effusion, measures up to 22mm adjacent to LV inferior wall IMPRESSION: 1. Findings consistent with acute myocarditis including markedly elevated native T1 (up to 1475ms), T2 (up to 76 ms), and ECV (up to 64%). In addition there is extensive/diffuse late gadolinium enhancement. 2. While clearly has acute myocarditis, ECV is higher and LGE more extensive than typically seen in myocarditis. This could just be due to severe  inflammation from myocarditis, or could have an underlying cardiomyopathy (does have severe asymmetric LVH that was present on prior echo 03/2019, and given ECV/LGE pattern, underlying amyloid is a consideration) with myocarditis in addition to this. If having ventricular arrhythmias, high degree AV block, or hemodynamic instability, would consider endomyocardial biopsy for further evaluation. 3. Normal LV size, severe asymmetric hypertrophy measuring up to 77mm in basal septum (66mm in posterior wall), and mild systolic dysfunction (EF [id number]). Unfortunately basal most LV short axis slice was not included in the data set to calculate EF, which introduces a small amount of error in the EF calculation, though visually EF appears 40-45% 4. RV volumes were not able to be calculated as several basal short axis slices were missing from the data set. Visually RV appears normal size with mild systolic dysfunction (apical akinesis) 5. Moderate pericardial effusion, measures up to 27mm adjacent to LV inferior wall Electronically Signed   By: Oswaldo Milian MD   On: 05/15/2020 21:14   ECHOCARDIOGRAM COMPLETE  Result Date: 05/13/2020    ECHOCARDIOGRAM REPORT   Patient Name:   Monique Gomez [facility] Date of Exam: 05/13/2020 Medical Rec #:  [id number]         Height:       62.0 in Accession #:    [id number]        Weight:       275.0 lb Date of Birth:  1955/05/31  BSA:          2.189 m Patient Age:    13 years          BP:           113/90 mmHg Patient Gender: F                 HR:           111 bpm. Exam Location:  [facility] Procedure: 2D Echo STAT ECHO Indications:    Dyspnea R06.00  History:        Patient has prior history of Echocardiogram examinations, most                 recent 03/14/2019. Previous Myocardial Infarction,                 Arrythmias:Atrial Fibrillation; Risk Factors:Dyslipidemia and                 Former Smoker.  Sonographer:    Leavy Cella RDCS (AE) Referring Phys: [id number] [place]  1. Left ventricular ejection fraction, by estimation, is 50%. The left ventricle has normal function. Left ventricular endocardial border not optimally defined to evaluate regional wall motion. There is moderate left ventricular hypertrophy. Left ventricular diastolic parameters are indeterminate.  2. Right ventricular systolic function is normal. The right ventricular size is normal.  3. There is indentation of the RA without collapse, no RV collapse. Borderline MV respiratory variation of 20%. Normal IVC. Overall findings are not consistent with tamponade physiology. Would repeat study 48 hours. Marland Kitchen a small pericardial effusion is present. The pericardial effusion is circumferential. There is no evidence of cardiac tamponade.  4. The mitral valve is normal in structure. No evidence of mitral valve regurgitation. No evidence of mitral stenosis.  5. The aortic valve has an indeterminant number of cusps. Aortic valve regurgitation is not visualized. No aortic stenosis is present.  6. The inferior vena cava is normal in size with greater than 50% respiratory variability, suggesting right atrial pressure of 3 mmHg. FINDINGS  Left Ventricle: Left ventricular ejection fraction, by estimation, is 50%. The left ventricle has normal function. Left ventricular endocardial border not optimally defined to evaluate regional wall motion. The left ventricular internal cavity size was normal in size. There is moderate left ventricular hypertrophy. Left ventricular diastolic parameters are indeterminate. Right Ventricle: The right ventricular size is normal. No increase in right ventricular wall thickness. Right ventricular systolic function is normal. Left Atrium: Left atrial size was normal in size. Right Atrium: Right atrial size was normal in size. Pericardium: There is indentation of the RA without collapse, no RV collapse. Borderline MV respiratory variation of 20%. Normal IVC. Overall findings are not consistent  with tamponade physiology. Would repeat study 48 hours. A small pericardial effusion  is present. The pericardial effusion is circumferential. There is no evidence of cardiac tamponade. Mitral Valve: The mitral valve is normal in structure. No evidence of mitral valve regurgitation. No evidence of mitral valve stenosis. Tricuspid Valve: The tricuspid valve is normal in structure. Tricuspid valve regurgitation is trivial. No evidence of tricuspid stenosis. Aortic Valve: The aortic valve has an indeterminant number of cusps. Aortic valve regurgitation is not visualized. No aortic stenosis is present. Aortic valve mean gradient measures 6.6 mmHg. Aortic valve peak gradient measures 12.5 mmHg. Pulmonic Valve: The pulmonic valve was not well visualized. Pulmonic valve regurgitation is not visualized. No evidence of pulmonic stenosis. Aorta: The aortic root is  normal in size and structure. Venous: The inferior vena cava is normal in size with greater than 50% respiratory variability, suggesting right atrial pressure of 3 mmHg. IAS/Shunts: No atrial level shunt detected by color flow Doppler.  LEFT VENTRICLE PLAX 2D LVIDd:         2.78 cm Diastology LVIDs:         1.97 cm LV e' medial:   4.82 cm/s LV PW:         1.94 cm LV E/e' medial: 29.0 LV IVS:        1.63 cm  RIGHT VENTRICLE RV S prime:     17.00 cm/s TAPSE (M-mode): 2.1 cm LEFT ATRIUM             Index       RIGHT ATRIUM           Index LA diam:        3.90 cm 1.78 cm/m  RA Area:     12.80 cm LA Vol (A2C):   52.0 ml 23.76 ml/m RA Volume:   34.60 ml  15.81 ml/m LA Vol (A4C):   54.5 ml 24.90 ml/m LA Biplane Vol: 53.0 ml 24.21 ml/m  AORTIC VALVE AV Vmax:           176.46 cm/s AV Vmean:          122.540 cm/s AV VTI:            0.233 m AV Peak Grad:      12.5 mmHg AV Mean Grad:      6.6 mmHg LVOT Vmax:         168.45 cm/s LVOT Vmean:        95.985 cm/s LVOT VTI:          0.183 m LVOT/AV VTI ratio: 0.78  AORTA Ao Root diam: 2.50 cm MITRAL VALVE                 TRICUSPID VALVE MV Area (PHT): 7.82 cm     TR Peak grad:   26.4 mmHg MV Decel Time: 97 msec      TR Vmax:        257.00 cm/s MV E velocity: 140.00 cm/s MV A velocity: 52.90 cm/s   SHUNTS MV E/A ratio:  2.65         Systemic VTI: 0.18 m Carlyle Dolly MD Electronically signed by Carlyle Dolly MD Signature Date/Time: 05/13/2020/1:04:08 PM    Final    Disposition   Pt is being discharged home today in good condition.  Follow-up Plans & Appointments     Follow-up Information    Derek Jack, MD. Schedule an appointment as soon as possible for a visit.   Specialty: Hematology Why: for possible IV fe and further work up Contact information: [place] [address] [phone]        Imogene Burn, PA-C Follow up on 05/26/2020.   Specialty: Cardiology Why: at 11:15am for your follow up appt Contact information: [place] [address] [phone]              Discharge Instructions    Call MD for:  difficulty breathing, headache or visual disturbances   Complete by: As directed    Call MD for:  persistant dizziness or light-headedness   Complete by: As directed    Diet - low sodium heart healthy   Complete by: As directed    Increase activity slowly   Complete by: As  directed       Discharge Medications   Allergies as of 05/19/2020      Reactions   Venlafaxine Nausea And Vomiting, Other (See Comments)   Dizziness, shakiness      Medication List    STOP taking these medications   atenolol 50 MG tablet Commonly known as: TENORMIN   lisinopril 40 MG tablet Commonly known as: ZESTRIL   predniSONE 10 MG tablet Commonly known as: DELTASONE     TAKE these medications   acetaminophen 500 MG tablet Commonly known as: TYLENOL Take 1 tablet (500 mg total) by mouth every 6 (six) hours as needed.   Aimovig 70 MG/ML Soaj Generic drug: Erenumab-aooe INJECT 70 MG INTO THE SKIN EVERY 30 DAYS   atorvastatin 80 MG tablet Commonly  known as: LIPITOR Take 1 tablet by mouth once daily   colchicine 0.6 MG tablet Take 1 tablet (0.6 mg total) by mouth daily.   cyclobenzaprine 5 MG tablet Commonly known as: FLEXERIL Take 1 tablet (5 mg total) by mouth 3 (three) times daily as needed.   Eliquis 5 MG Tabs tablet Generic drug: apixaban Take 1 tablet by mouth twice daily   fluticasone 50 MCG/ACT nasal spray Commonly known as: FLONASE Place 2 sprays into both nostrils daily.   Fusion Plus Caps Take 2 tablets by mouth daily.   ipratropium 0.03 % nasal spray Commonly known as: ATROVENT Place 2 sprays into both nostrils every 12 (twelve) hours.   losartan 25 MG tablet Commonly known as: COZAAR Take 1 tablet (25 mg total) by mouth in the morning and at bedtime. Start taking on: May 20, 2020   metFORMIN 500 MG tablet Commonly known as: GLUCOPHAGE TAKE 1 TABLET BY MOUTH TWICE DAILY WITH A MEAL   metoCLOPramide 10 MG tablet Commonly known as: REGLAN Take 1 tablet (10 mg total) by mouth every 8 (eight) hours as needed for up to 7 days for nausea (Headache).   metoprolol succinate 25 MG 24 hr tablet Commonly known as: TOPROL-XL Take 1.5 tablets (37.5 mg total) by mouth daily.   ondansetron 4 MG disintegrating tablet Commonly known as: Zofran ODT Take 1 tablet (4 mg total) by mouth every 8 (eight) hours as needed.   pantoprazole 40 MG tablet Commonly known as: PROTONIX Take 1 tablet by mouth twice daily   Semaglutide 7 MG Tabs Take 7 mg by mouth daily before breakfast.   SUMAtriptan 6 MG/0.5ML Soln injection Commonly known as: Imitrex Inject 0.5 mLs (6 mg total) into the skin every 2 (two) hours as needed for migraine or headache. May repeat in 2 hours if headache persists or recurs.   tiZANidine 4 MG tablet Commonly known as: Zanaflex Take 1 tablet (4 mg total) by mouth every 6 (six) hours as needed for muscle spasms.   topiramate 50 MG tablet Commonly known as: TOPAMAX Take 1.5 tablets (75 mg  total) by mouth at bedtime.   traMADol 50 MG tablet Commonly known as: ULTRAM Take by mouth every 6 (six) hours as needed.   Ubrelvy 50 MG Tabs Generic drug: Ubrogepant Take 1 tablet at the onset of a migraine.  Can repeat in 2 hours if needed.       Outstanding Labs/Studies   N/a  Duration of Discharge Encounter   Greater than 30 minutes including physician time.  Signed, Reino Bellis, NP 05/19/2020, 2:04 PM   Patient seen and examined. Agree with assessment and plan.  Patient feels better today.  No chest pain.  Heart  rate improved now in the upper 80s.  She is now on metoprolol succinate, with dose increased today to 37.5 mg.  He is stable for discharge.  Will change from previous home lisinopril to losartan which she has been on in the hospital and has tolerated well without cough.  Continue continue colchicine for her myocarditis with extensive and diffuse late gadolinium enhancement.  She will have follow-up appointment next week with Gevena Cotton.  Also will need hematologic evaluation as outpatient.  She ultimately will follow-up with Dr. Domenic Polite who is her primary cardiologist.   Troy Sine, MD, [facility] 05/19/2020 2:04 PM

## 2020-05-22 LAB — RSV(RESPIRATORY SYNCYTIAL VIRUS) AB, BLOOD: RSV Ab: 1:32 {titer} — ABNORMAL HIGH

## 2020-05-26 ENCOUNTER — Ambulatory Visit: Payer: BC Managed Care – PPO | Admitting: Physician Assistant

## 2020-05-26 ENCOUNTER — Other Ambulatory Visit: Payer: Self-pay

## 2020-05-26 ENCOUNTER — Encounter: Payer: Self-pay | Admitting: *Deleted

## 2020-05-26 ENCOUNTER — Encounter: Payer: Self-pay | Admitting: Physician Assistant

## 2020-05-26 VITALS — BP 150/92 | HR 110 | Ht 62.0 in | Wt 274.0 lb

## 2020-05-26 DIAGNOSIS — G4733 Obstructive sleep apnea (adult) (pediatric): Secondary | ICD-10-CM | POA: Diagnosis not present

## 2020-05-26 DIAGNOSIS — I480 Paroxysmal atrial fibrillation: Secondary | ICD-10-CM

## 2020-05-26 DIAGNOSIS — I5042 Chronic combined systolic (congestive) and diastolic (congestive) heart failure: Secondary | ICD-10-CM | POA: Diagnosis not present

## 2020-05-26 DIAGNOSIS — I400 Infective myocarditis: Secondary | ICD-10-CM | POA: Diagnosis not present

## 2020-05-26 DIAGNOSIS — D508 Other iron deficiency anemias: Secondary | ICD-10-CM

## 2020-05-26 DIAGNOSIS — Z9989 Dependence on other enabling machines and devices: Secondary | ICD-10-CM

## 2020-05-26 DIAGNOSIS — I10 Essential (primary) hypertension: Secondary | ICD-10-CM

## 2020-05-26 MED ORDER — METOPROLOL SUCCINATE ER 25 MG PO TB24: 25.0000 mg | ORAL_TABLET | Freq: Two times a day (BID) | ORAL | 3 refills | Status: DC

## 2020-05-26 NOTE — Patient Instructions (Signed)
Medication Instructions:  Your physician has recommended you make the following change in your medication:   Metoprolol Succinate 25 mg Two Times Daily   *If you need a refill on your cardiac medications before your next appointment, please call your pharmacy*   Lab Work: NONE  If you have labs (blood work) drawn today and your tests are completely normal, you will receive your results only by: Marland Kitchen MyChart Message (if you have MyChart) OR . A paper copy in the mail If you have any lab test that is abnormal or we need to change your treatment, we will call you to review the results.   Testing/Procedures: NONE     Follow-Up: At [facility], you and your health needs are our priority.  As part of our continuing mission to provide you with exceptional heart care, we have created designated Provider Care Teams.  These Care Teams include your primary Cardiologist (physician) and Advanced Practice Providers (APPs -  Physician Assistants and Nurse Practitioners) who all work together to provide you with the care you need, when you need it.  We recommend signing up for the patient portal called "MyChart".  Sign up information is provided on this After Visit Summary.  MyChart is used to connect with patients for Virtual Visits (Telemedicine).  Patients are able to view lab/test results, encounter notes, upcoming appointments, etc.  Non-urgent messages can be sent to your provider as well.   To learn more about what you can do with MyChart, go to [email or web address].    Your next appointment:   2-3 week(s)  The format for your next appointment:   In Person  Provider:   You may see Rozann Lesches, MD or one of the following Advanced Practice Providers on your designated Care Team:    Bernerd Pho, PA-C   Ermalinda Barrios, PA-C     Other Instructions Thank you for choosing [facility]!

## 2020-06-01 ENCOUNTER — Other Ambulatory Visit: Payer: Self-pay | Admitting: *Deleted

## 2020-06-01 MED ORDER — APIXABAN 5 MG PO TABS: 5.0000 mg | ORAL_TABLET | Freq: Two times a day (BID) | ORAL | 3 refills | Status: DC

## 2020-06-02 ENCOUNTER — Other Ambulatory Visit: Payer: Self-pay | Admitting: Family Medicine

## 2020-06-09 ENCOUNTER — Other Ambulatory Visit: Payer: Self-pay | Admitting: Family Medicine

## 2020-06-09 DIAGNOSIS — K219 Gastro-esophageal reflux disease without esophagitis: Secondary | ICD-10-CM

## 2020-06-11 ENCOUNTER — Ambulatory Visit: Payer: Self-pay | Admitting: Nurse Practitioner

## 2020-06-12 ENCOUNTER — Ambulatory Visit: Payer: BC Managed Care – PPO | Admitting: Student

## 2020-06-12 ENCOUNTER — Other Ambulatory Visit: Payer: Self-pay

## 2020-06-12 ENCOUNTER — Encounter: Payer: Self-pay | Admitting: Student

## 2020-06-12 VITALS — BP 126/92 | HR 96 | Ht 62.0 in | Wt 274.0 lb

## 2020-06-12 DIAGNOSIS — I10 Essential (primary) hypertension: Secondary | ICD-10-CM

## 2020-06-12 DIAGNOSIS — I480 Paroxysmal atrial fibrillation: Secondary | ICD-10-CM | POA: Diagnosis not present

## 2020-06-12 DIAGNOSIS — I400 Infective myocarditis: Secondary | ICD-10-CM

## 2020-06-12 DIAGNOSIS — D508 Other iron deficiency anemias: Secondary | ICD-10-CM

## 2020-06-12 DIAGNOSIS — E785 Hyperlipidemia, unspecified: Secondary | ICD-10-CM | POA: Diagnosis not present

## 2020-06-12 MED ORDER — METOPROLOL SUCCINATE ER 25 MG PO TB24: ORAL_TABLET | ORAL | 3 refills | Status: DC

## 2020-06-12 MED ORDER — COLCHICINE 0.6 MG PO TABS: 0.6000 mg | ORAL_TABLET | Freq: Every day | ORAL | 0 refills | Status: DC

## 2020-06-12 MED ORDER — LOSARTAN POTASSIUM 25 MG PO TABS: 25.0000 mg | ORAL_TABLET | Freq: Two times a day (BID) | ORAL | 6 refills | Status: DC

## 2020-06-12 NOTE — Progress Notes (Signed)
Cardiology Office Note    Date:  06/13/2020   ID:  Carry, Weesner 10-12-1955, MRN [id number]  PCP:  Alycia Rossetti, MD  Cardiologist: Rozann Lesches, MD    Chief Complaint  Patient presents with  . Follow-up    2 week visit    History of Present Illness:    Monique Gomez is a 65 y.o. female with past medical history of paroxysmal atrial fibrillation, history of acute myocarditis (occurring in 05/2020 with cath showing normal cors), HTN, HLD, OSA (on CPAP), Type II DM and GERD who presents to the office today for 2-week follow-up.   The patient was admitted to [facility] in 05/2020 for evaluation of an NSTEMI with elevated troponin values peaking at greater than 27,000. She had recently experienced a viral illness and there was concern for myocarditis. Given her enzyme elevation and EKG abnormalities, she underwent a cardiac catheterization on 05/14/2020 which showed normal coronary arteries.  Cardiac MRI was consistent with acute myocarditis. Viral panel was positive for RSV. The cardiac MRI mentioned that she did have severe asymmetric LVH and given ECV/LGE pattern, amyloid could be a consideration as well and if she had ventricular arrhythmias, high degree AV block or hemodynamic instability and would recommend further evaluation. Also noted to have a moderate pericardial effusion. She was started on colchicine and NSAIDs were contraindicated given myocardial involvement. She was also treated for anemia during admission and received 1 unit pRBCs. At follow-up, she reported feeling very fatigued but denied any chest pain by review of notes. Her heart rate was elevated and Toprol-XL was increased to 25 mg twice daily.  In talking with the patient today, she reports her recovery has been slow as she was previously very active prior to her admission but now gets fatigued with minimal activity. Says she tried going to the grocery store yesterday and developed worsening dyspnea,  fatigue and her hands were shaking by the time she left. She has been trying to gradually increase her activity around her home but gets fatigued and has dyspnea if walking for more than a minute. No recurrent chest pain resembling her initial symptoms. She does experience palpitations during the day. No recent orthopnea, PND or edema. Uses a CPAP at night.   Past Medical History:  Diagnosis Date  . Abdominal fibromatosis   . Allergic rhinitis   . Anemia   . Asthma   . Essential hypertension   . GERD (gastroesophageal reflux disease)   . Headache(784.0)   . History of cardiac catheterization    No significant CAD May 2015  . History of migraine headaches   . Hyperlipidemia   . Migraines   . Myocarditis ([place])    a. diagnosed in 05/2020 with cath showing normal cors  . Obesity   . PAF (paroxysmal atrial fibrillation) ([place]) 08/2013  . Sleep apnea   . Type 2 diabetes mellitus ([place])     Past Surgical History:  Procedure Laterality Date  . CATARACT EXTRACTION    . CATARACT EXTRACTION W/PHACO Right 04/05/2019   Procedure: CATARACT EXTRACTION PHACO AND INTRAOCULAR LENS PLACEMENT RIGHT EYE (CDE: 3.19);  Surgeon: Baruch Goldmann, MD;  Location: [facility];  Service: Ophthalmology;  Laterality: Right;  . COLONOSCOPY N/A 02/02/2015   SLF: 1. one colon polyp removed-no source for anemia identified. 2. moderate diverticulosis noted in the sigmoid colon and descending colon 3. the left colon is redundant 4. Rectal bleeding due ot small internal hemorroids 5. Moderate sized external hemorrhoids.  Marland Kitchen  ESOPHAGOGASTRODUODENOSCOPY N/A 02/02/2015   SLF: 1. 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 large hiatal hernia 3. mild non-erosive gastritis 4. No source for anemia identified.   Marland Kitchen LEFT HEART CATH AND CORONARY ANGIOGRAPHY N/A 05/14/2020   Procedure: LEFT HEART CATH AND CORONARY ANGIOGRAPHY;  Surgeon: Belva Crome, MD;  Location: [facility];  Service: Cardiovascular;  Laterality: N/A;  .  LEFT HEART CATHETERIZATION WITH CORONARY ANGIOGRAM N/A 09/03/2013   Procedure: LEFT HEART CATHETERIZATION WITH CORONARY ANGIOGRAM;  Surgeon: Jettie Booze, MD;  Location: [facility] CATH LAB;  Service: Cardiovascular;  Laterality: N/A;  . Right carpal tunnel release      Current Medications: Outpatient Medications Prior to Visit  Medication Sig Dispense Refill  . acetaminophen (TYLENOL) 500 MG tablet Take 1 tablet (500 mg total) by mouth every 6 (six) hours as needed. 30 tablet 0  . AIMOVIG 70 MG/ML SOAJ INJECT 70 MG INTO THE SKIN EVERY 30 DAYS 1 mL 2  . apixaban (ELIQUIS) 5 MG TABS tablet Take 1 tablet (5 mg total) by mouth 2 (two) times daily. 60 tablet 3  . atorvastatin (LIPITOR) 80 MG tablet Take 1 tablet by mouth once daily 90 tablet 0  . cyclobenzaprine (FLEXERIL) 5 MG tablet Take 1 tablet (5 mg total) by mouth 3 (three) times daily as needed. 30 tablet 0  . fluticasone (FLONASE) 50 MCG/ACT nasal spray Place 2 sprays into both nostrils daily.    Marland Kitchen ipratropium (ATROVENT) 0.03 % nasal spray Place 2 sprays into both nostrils every 12 (twelve) hours. 30 mL 3  . Iron-FA-B Cmp-C-Biot-Probiotic (FUSION PLUS) CAPS Take 2 tablets by mouth daily. 60 capsule 3  . metFORMIN (GLUCOPHAGE) 500 MG tablet TAKE 1 TABLET BY MOUTH TWICE DAILY WITH A MEAL 180 tablet 0  . metoCLOPramide (REGLAN) 10 MG tablet Take 1 tablet (10 mg total) by mouth every 8 (eight) hours as needed for up to 7 days for nausea (Headache). 21 tablet 0  . ondansetron (ZOFRAN ODT) 4 MG disintegrating tablet Take 1 tablet (4 mg total) by mouth every 8 (eight) hours as needed. 20 tablet 6  . pantoprazole (PROTONIX) 40 MG tablet Take 1 tablet by mouth twice daily 180 tablet 0  . Semaglutide 7 MG TABS Take 7 mg by mouth daily before breakfast. 30 tablet 2  . topiramate (TOPAMAX) 50 MG tablet Take 1.5 tablets (75 mg total) by mouth at bedtime. 135 tablet 3  . Ubrogepant (UBRELVY) 50 MG TABS Take 1 tablet at the onset of a migraine.  Can repeat  in 2 hours if needed. 15 tablet 5  . colchicine 0.6 MG tablet Take 1 tablet (0.6 mg total) by mouth daily. 90 tablet 0  . losartan (COZAAR) 25 MG tablet Take 1 tablet (25 mg total) by mouth in the morning and at bedtime. 60 tablet 6  . metoprolol succinate (TOPROL-XL) 25 MG 24 hr tablet Take 1 tablet (25 mg total) by mouth in the morning and at bedtime. Take with or immediately following a meal. 180 tablet 3   No facility-administered medications prior to visit.     Allergies:   Venlafaxine   Social History   Socioeconomic History  . Marital status: Single    Spouse name: Not on file  . Number of children: 2  . Years of education: Not on file  . Highest education level: Not on file  Occupational History  . Occupation: Training and development officer: [employer]  Tobacco Use  .  Smoking status: Former Smoker    Packs/day: 0.30    Types: Cigarettes    Quit date: 04/01/2012    Years since quitting: 8.2  . Smokeless tobacco: Never Used  Vaping Use  . Vaping Use: Never used  Substance and Sexual Activity  . Alcohol use: Yes    Alcohol/week: 0.0 standard drinks    Comment: very occasional  . Drug use: No  . Sexual activity: Yes  Other Topics Concern  . Not on file  Social History Narrative  . Not on file   Social Determinants of Health   Financial Resource Strain: Not on file  Food Insecurity: Not on file  Transportation Needs: Not on file  Physical Activity: Not on file  Stress: Not on file  Social Connections: Not on file     Family History:  The patient's family history includes Alzheimer's disease in her father; Arthritis in her sister; Developmental delay in her son; Diabetes in her brother, mother, and son; Heart disease in her mother; High Cholesterol in her mother and sister; Hypertension in her brother, mother, sister, and sister.   Review of Systems:   Please see the history of present illness.     General:  No chills, fever, night sweats or weight changes.  Positive for fatigue.  Cardiovascular:  No chest pain, edema, orthopnea, paroxysmal nocturnal dyspnea. Positive for dyspnea on exertion and palpitations.  Dermatological: No rash, lesions/masses Respiratory: No cough, dyspnea Urologic: No hematuria, dysuria Abdominal:   No nausea, vomiting, diarrhea, bright red blood per rectum, melena, or hematemesis Neurologic:  No visual changes, wkns, changes in mental status. All other systems reviewed and are otherwise negative except as noted above.   Physical Exam:    VS:  BP (!) 126/92   Pulse 96   Ht 5\' 2"  (1.575 m)   Wt 274 lb (124.3 kg)   SpO2 98%   BMI 50.12 kg/m    General: Well developed, obese female appearing in no acute distress. Head: Normocephalic, atraumatic. Neck: No carotid bruits. JVD not elevated.  Lungs: Respirations regular and unlabored, without wheezes or rales.  Heart: Regular rate and rhythm. No S3 or S4.  No murmur, no rubs, or gallops appreciated. Abdomen: Appears non-distended. No obvious abdominal masses. Msk:  Strength and tone appear normal for age. No obvious joint deformities or effusions. Extremities: No clubbing or cyanosis. No lower extremity edema.  Distal pedal pulses are 2+ bilaterally. Neuro: Alert and oriented X 3. Moves all extremities spontaneously. No focal deficits noted. Psych:  Responds to questions appropriately with a normal affect. Skin: No rashes or lesions noted  Wt Readings from Last 3 Encounters:  06/12/20 274 lb (124.3 kg)  05/26/20 274 lb (124.3 kg)  05/19/20 275 lb 3.2 oz (124.8 kg)     Studies/Labs Reviewed:   EKG:  EKG is not ordered today.    Recent Labs: 10/15/2019: Magnesium 1.8 05/13/2020: ALT 41; B Natriuretic Peptide 1,175.0; TSH 3.035 05/17/2020: BUN 15; Creatinine, Ser 1.05; Potassium 3.7; Sodium 142 05/19/2020: Hemoglobin 9.6; Platelets 373   Lipid Panel    Component Value Date/Time   CHOL 162 05/13/2020 1005   TRIG 156 (H) 05/13/2020 1005   HDL 24 (L) 05/13/2020  1005   CHOLHDL 6.8 05/13/2020 1005   VLDL 31 05/13/2020 1005   LDLCALC 107 (H) 05/13/2020 1005   LDLCALC 107 (H) 12/18/2019 7741    Additional studies/ records that were reviewed today include:   Echocardiogram: 05/13/2020 IMPRESSIONS    1. Left ventricular  ejection fraction, by estimation, is 50%. The left  ventricle has normal function. Left ventricular endocardial border not  optimally defined to evaluate regional wall motion. There is moderate left  ventricular hypertrophy. Left  ventricular diastolic parameters are indeterminate.  2. Right ventricular systolic function is normal. The right ventricular  size is normal.  3. There is indentation of the RA without collapse, no RV collapse.  Borderline MV respiratory variation of 20%. Normal IVC. Overall findings  are not consistent with tamponade physiology. Would repeat study 48 hours.  Marland Kitchen a small pericardial effusion is  present. The pericardial effusion is circumferential. There is no evidence  of cardiac tamponade.  4. The mitral valve is normal in structure. No evidence of mitral valve  regurgitation. No evidence of mitral stenosis.  5. The aortic valve has an indeterminant number of cusps. Aortic valve  regurgitation is not visualized. No aortic stenosis is present.  6. The inferior vena cava is normal in size with greater than 50%  respiratory variability, suggesting right atrial pressure of 3 mmHg.    Cardiac Catheterization: 05/14/2020  Widely patent/normal widely patent coronary arteries.  Right dominant anatomy  Regional wall motion abnormality noted on left ventriculography. EF 40 to 50%.  RECOMMENDATIONS:   Given context of clinical presentation, myopericarditis is now the leading diagnosis in the differential. This presentation is not atypical stress cardiomyopathy. Recommend MRI/cardiac.    Assessment:    1. Acute viral myocarditis   2. Paroxysmal atrial fibrillation (HCC)   3. Essential  hypertension   4. Hyperlipidemia, unspecified hyperlipidemia type   5. Other iron deficiency anemia      Plan:   In order of problems listed above:  1. Acute Viral Myocarditis - While she says her symptoms have improved over the past few weeks, she is still experiencing significant fatigue and dyspnea on exertion. Her resting HR is in the 90's today and she reports palpitations with minimal activity. Will try increasing her Toprol-XL to 50mg  in AM/25mg  in PM to see if this helps with her symptoms. Remains on Colchicine 0.6mg  daily.  - Will plan for a repeat limited echocardiogram to reassess LV function and her pericardial effusion. The Cardiac MRI also mentioned severe asymmetric LVH so will allow for reassessment. If concern for amyloid in the future as noted on her cMRI report, could arrange for a PYP scan once further out from her acute event.   - I did provide a work-note today to remain out until her repeat echocardiogram and follow-up visit in 4 weeks. We reviewed ways for her to safely try to increase her activity at home.   2. Paroxysmal Atrial Fibrillation - Her rhythm is regular by examination today but she has experienced palpitations as outlined above. Will titrate Toprol-XL to 50mg  in AM/25mg  in PM.  - She denies any evidence of active bleeding. Remains on Eliquis 5mg  BID for anticoagulation.   3. HTN - BP is at 126/92 during today's visit. Will titrate Toprol-XL and continue Losartan 25mg  daily (listed as BID dosing but she has only been taking once daily). Will see if a BP cuff can be provided by Social Work and have her follow her readings at home. If BP remains above goal, can titrate Losartan to 50mg  daily.   4. HLD - Followed by PCP. She remains on Atorvastatin 80mg  dialy.   5. Anemia - Hgb was at 9.6 on the day of hospital discharge. Will recheck CBC when she comes back for her echocardiogram given her  fatigue and dyspnea. Will also add a BMET given the use of  Colchicine.     Medication Adjustments/Labs and Tests Ordered: Current medicines are reviewed at length with the patient today.  Concerns regarding medicines are outlined above.  Medication changes, Labs and Tests ordered today are listed in the Patient Instructions below. Patient Instructions  Medication Instructions:  TOPROL XL increased to 50 mg in the morning and 25 mg in the evening.   *If you need a refill on your cardiac medications before your next appointment, please call your pharmacy*   Lab Work: None Today If you have labs (blood work) drawn today and your tests are completely normal, you will receive your results only by: Marland Kitchen MyChart Message (if you have MyChart) OR . A paper copy in the mail If you have any lab test that is abnormal or we need to change your treatment, we will call you to review the results.   Testing/Procedures: Your physician has requested that you have an echocardiogram. Echocardiography is a painless test that uses sound waves to create images of your heart. It provides your doctor with information about the size and shape of your heart and how well your heart's chambers and valves are working. This procedure takes approximately one hour. There are no restrictions for this procedure.     Follow-Up: At [facility], you and your health needs are our priority.  As part of our continuing mission to provide you with exceptional heart care, we have created designated Provider Care Teams.  These Care Teams include your primary Cardiologist (physician) and Advanced Practice Providers (APPs -  Physician Assistants and Nurse Practitioners) who all work together to provide you with the care you need, when you need it.  We recommend signing up for the patient portal called "MyChart".  Sign up information is provided on this After Visit Summary.  MyChart is used to connect with patients for Virtual Visits (Telemedicine).  Patients are able to view lab/test results,  encounter notes, upcoming appointments, etc.  Non-urgent messages can be sent to your provider as well.   To learn more about what you can do with MyChart, go to [email or web address].    Your next appointment:   July 10, 2020 at 1 pm with Dr. Myles Gip   Other Instructions None Today        Signed, Erma Heritage, PA-C  06/13/2020 9:44 AM    [facility]. [address] [place], [place] [address] Phone: [phone] Fax: [phone]

## 2020-06-12 NOTE — Patient Instructions (Signed)
Medication Instructions:  TOPROL XL increased to 50 mg in the morning and 25 mg in the evening.   *If you need a refill on your cardiac medications before your next appointment, please call your pharmacy*   Lab Work: None Today If you have labs (blood work) drawn today and your tests are completely normal, you will receive your results only by: Marland Kitchen MyChart Message (if you have MyChart) OR . A paper copy in the mail If you have any lab test that is abnormal or we need to change your treatment, we will call you to review the results.   Testing/Procedures: Your physician has requested that you have an echocardiogram. Echocardiography is a painless test that uses sound waves to create images of your heart. It provides your doctor with information about the size and shape of your heart and how well your heart's chambers and valves are working. This procedure takes approximately one hour. There are no restrictions for this procedure.     Follow-Up: At [facility], you and your health needs are our priority.  As part of our continuing mission to provide you with exceptional heart care, we have created designated Provider Care Teams.  These Care Teams include your primary Cardiologist (physician) and Advanced Practice Providers (APPs -  Physician Assistants and Nurse Practitioners) who all work together to provide you with the care you need, when you need it.  We recommend signing up for the patient portal called "MyChart".  Sign up information is provided on this After Visit Summary.  MyChart is used to connect with patients for Virtual Visits (Telemedicine).  Patients are able to view lab/test results, encounter notes, upcoming appointments, etc.  Non-urgent messages can be sent to your provider as well.   To learn more about what you can do with MyChart, go to [email or web address].    Your next appointment:   July 10, 2020 at 1 pm with Dr. Myles Gip   Other Instructions None  Today

## 2020-06-13 ENCOUNTER — Encounter: Payer: Self-pay | Admitting: Student

## 2020-06-15 ENCOUNTER — Other Ambulatory Visit: Payer: Self-pay

## 2020-06-15 ENCOUNTER — Telehealth: Payer: Self-pay | Admitting: Licensed Clinical Social Worker

## 2020-06-15 DIAGNOSIS — Z79899 Other long term (current) drug therapy: Secondary | ICD-10-CM

## 2020-06-15 NOTE — Telephone Encounter (Signed)
CSW referred to assist patient with obtaining a BP cuff. CSW contacted patient to inform cuff will be delivered to home. Patient grateful for support and assistance. CSW available as needed. Jackie Likisha Alles, LCSW, CCSW-MCS [phone]  

## 2020-06-23 ENCOUNTER — Ambulatory Visit (HOSPITAL_COMMUNITY)
Admission: RE | Admit: 2020-06-23 | Discharge: 2020-06-23 | Disposition: A | Discharge status: Home / Self Care | Payer: BC Managed Care – PPO | Source: Ambulatory Visit | Attending: Student | Admitting: Student

## 2020-06-23 ENCOUNTER — Other Ambulatory Visit: Payer: Self-pay

## 2020-06-23 DIAGNOSIS — I400 Infective myocarditis: Secondary | ICD-10-CM | POA: Diagnosis not present

## 2020-06-23 LAB — ECHOCARDIOGRAM LIMITED
Area-P 1/2: 2.09 cm2
Calc EF: 50.9 %
S' Lateral: 2.5 cm
Single Plane A2C EF: 56.6 %
Single Plane A4C EF: 51.5 %

## 2020-06-23 NOTE — Progress Notes (Signed)
*  PRELIMINARY RESULTS* Echocardiogram 2D Echocardiogram has been performed.  Monique Gomez 06/23/2020, 11:06 AM

## 2020-06-24 ENCOUNTER — Ambulatory Visit: Payer: BC Managed Care – PPO | Admitting: Cardiology

## 2020-07-09 ENCOUNTER — Inpatient Hospital Stay (HOSPITAL_COMMUNITY): Payer: BC Managed Care – PPO | Attending: Hematology

## 2020-07-09 ENCOUNTER — Other Ambulatory Visit: Payer: Self-pay

## 2020-07-09 DIAGNOSIS — Z8349 Family history of other endocrine, nutritional and metabolic diseases: Secondary | ICD-10-CM | POA: Insufficient documentation

## 2020-07-09 DIAGNOSIS — Z833 Family history of diabetes mellitus: Secondary | ICD-10-CM | POA: Diagnosis not present

## 2020-07-09 DIAGNOSIS — Z8261 Family history of arthritis: Secondary | ICD-10-CM | POA: Diagnosis not present

## 2020-07-09 DIAGNOSIS — Z87891 Personal history of nicotine dependence: Secondary | ICD-10-CM | POA: Insufficient documentation

## 2020-07-09 DIAGNOSIS — Z7901 Long term (current) use of anticoagulants: Secondary | ICD-10-CM | POA: Diagnosis not present

## 2020-07-09 DIAGNOSIS — J45909 Unspecified asthma, uncomplicated: Secondary | ICD-10-CM | POA: Insufficient documentation

## 2020-07-09 DIAGNOSIS — Z8249 Family history of ischemic heart disease and other diseases of the circulatory system: Secondary | ICD-10-CM | POA: Diagnosis not present

## 2020-07-09 DIAGNOSIS — D509 Iron deficiency anemia, unspecified: Secondary | ICD-10-CM | POA: Diagnosis not present

## 2020-07-09 DIAGNOSIS — E119 Type 2 diabetes mellitus without complications: Secondary | ICD-10-CM | POA: Diagnosis not present

## 2020-07-09 DIAGNOSIS — I480 Paroxysmal atrial fibrillation: Secondary | ICD-10-CM | POA: Diagnosis not present

## 2020-07-09 DIAGNOSIS — G4730 Sleep apnea, unspecified: Secondary | ICD-10-CM | POA: Insufficient documentation

## 2020-07-09 DIAGNOSIS — Z79899 Other long term (current) drug therapy: Secondary | ICD-10-CM | POA: Insufficient documentation

## 2020-07-09 DIAGNOSIS — Z7984 Long term (current) use of oral hypoglycemic drugs: Secondary | ICD-10-CM | POA: Diagnosis not present

## 2020-07-09 DIAGNOSIS — I10 Essential (primary) hypertension: Secondary | ICD-10-CM | POA: Insufficient documentation

## 2020-07-09 DIAGNOSIS — R591 Generalized enlarged lymph nodes: Secondary | ICD-10-CM | POA: Diagnosis not present

## 2020-07-09 LAB — IRON AND TIBC
Iron: 79 ug/dL (ref 28–170)
Saturation Ratios: 22 % (ref 10.4–31.8)
TIBC: 361 ug/dL (ref 250–450)
UIBC: 282 ug/dL

## 2020-07-09 LAB — CBC WITH DIFFERENTIAL/PLATELET
Abs Immature Granulocytes: 0.01 10*3/uL (ref 0.00–0.07)
Basophils Absolute: 0 10*3/uL (ref 0.0–0.1)
Basophils Relative: 1 %
Eosinophils Absolute: 0.3 10*3/uL (ref 0.0–0.5)
Eosinophils Relative: 3 %
HCT: 43.1 % (ref 36.0–46.0)
Hemoglobin: 12.8 g/dL (ref 12.0–15.0)
Immature Granulocytes: 0 %
Lymphocytes Relative: 31 %
Lymphs Abs: 2.4 10*3/uL (ref 0.7–4.0)
MCH: 21.5 pg — ABNORMAL LOW (ref 26.0–34.0)
MCHC: 29.7 g/dL — ABNORMAL LOW (ref 30.0–36.0)
MCV: 72.4 fL — ABNORMAL LOW (ref 80.0–100.0)
Monocytes Absolute: 0.7 10*3/uL (ref 0.1–1.0)
Monocytes Relative: 9 %
Neutro Abs: 4.4 10*3/uL (ref 1.7–7.7)
Neutrophils Relative %: 56 %
Platelets: 224 10*3/uL (ref 150–400)
RBC: 5.95 MIL/uL — ABNORMAL HIGH (ref 3.87–5.11)
RDW: 22.9 % — ABNORMAL HIGH (ref 11.5–15.5)
WBC: 7.7 10*3/uL (ref 4.0–10.5)
nRBC: 0 % (ref 0.0–0.2)

## 2020-07-09 LAB — COMPREHENSIVE METABOLIC PANEL
ALT: 39 U/L (ref 0–44)
AST: 24 U/L (ref 15–41)
Albumin: 3.8 g/dL (ref 3.5–5.0)
Alkaline Phosphatase: 70 U/L (ref 38–126)
Anion gap: 9 (ref 5–15)
BUN: 17 mg/dL (ref 8–23)
CO2: 24 mmol/L (ref 22–32)
Calcium: 9.6 mg/dL (ref 8.9–10.3)
Chloride: 106 mmol/L (ref 98–111)
Creatinine, Ser: 0.76 mg/dL (ref 0.44–1.00)
GFR, Estimated: >60 mL/min (ref >60)
Glucose, Bld: 90 mg/dL (ref 70–99)
Potassium: 3.9 mmol/L (ref 3.5–5.1)
Sodium: 139 mmol/L (ref 135–145)
Total Bilirubin: 0.4 mg/dL (ref 0.3–1.2)
Total Protein: 7.4 g/dL (ref 6.5–8.1)

## 2020-07-09 LAB — LACTATE DEHYDROGENASE: LDH: 126 U/L (ref 98–192)

## 2020-07-09 LAB — FERRITIN: Ferritin: 35 ng/mL (ref 11–307)

## 2020-07-09 NOTE — Progress Notes (Signed)
Cardiology Office Note  Date: 07/10/2020   ID: Gomez, Monique January 29, 1956, MRN [id number]  PCP:  Lindell Spar, MD  Cardiologist:  Rozann Lesches, MD Electrophysiologist:  None   Chief Complaint  Patient presents with  . Cardiac follow-up    History of Present Illness: Monique Gomez is a 65 y.o. female last seen in February by Ms. Strader PA-C.  She has a history of acute viral myocarditis (RSV) in January of this year, normal coronary arteries at that time, and cardiac MRI consistent with myocarditis, also concerning for severe asymmetric LVH with possible infiltrative cardiomyopathy.  She feels better relatively speaking in comparison with January, still short of breath and fatigue however, NYHA class II-III.  Intermittent sense of palpitations as well, no sudden dizziness or syncope.  No chest pain.  She was continued on colchicine at the last visit with up titration of Toprol-XL.  Follow-up limited echocardiogram revealed normal LVEF at 55%, severe basal septal hypertrophy, and only a trivial residual pericardial effusion.  We reviewed her medications today and discussed up titration of beta-blocker, also addition of low-dose Aldactone for better control of blood pressure in combination with losartan.  We also discussed referral to the heart failure clinic.  She works for [employer], still out of work at this point.  Past Medical History:  Diagnosis Date  . Abdominal fibromatosis   . Allergic rhinitis   . Anemia   . Asthma   . Essential hypertension   . GERD (gastroesophageal reflux disease)   . Headache(784.0)   . History of cardiac catheterization    No significant CAD May 2015  . History of migraine headaches   . Hyperlipidemia   . Migraines   . Myocarditis ([place])    a. diagnosed in 05/2020 with cath showing normal cors  . Obesity   . PAF (paroxysmal atrial fibrillation) ([place]) 08/2013  . Sleep apnea   . Type 2 diabetes mellitus  ([place])     Past Surgical History:  Procedure Laterality Date  . CATARACT EXTRACTION    . CATARACT EXTRACTION W/PHACO Right 04/05/2019   Procedure: CATARACT EXTRACTION PHACO AND INTRAOCULAR LENS PLACEMENT RIGHT EYE (CDE: 3.19);  Surgeon: Baruch Goldmann, MD;  Location: [facility];  Service: Ophthalmology;  Laterality: Right;  . COLONOSCOPY N/A 02/02/2015   SLF: 1. one colon polyp removed-no source for anemia identified. 2. moderate diverticulosis noted in the sigmoid colon and descending colon 3. the left colon is redundant 4. Rectal bleeding due ot small internal hemorroids 5. Moderate sized external hemorrhoids.  . ESOPHAGOGASTRODUODENOSCOPY N/A 02/02/2015   SLF: 1. 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 large hiatal hernia 3. mild non-erosive gastritis 4. No source for anemia identified.   Marland Kitchen LEFT HEART CATH AND CORONARY ANGIOGRAPHY N/A 05/14/2020   Procedure: LEFT HEART CATH AND CORONARY ANGIOGRAPHY;  Surgeon: Belva Crome, MD;  Location: [facility];  Service: Cardiovascular;  Laterality: N/A;  . LEFT HEART CATHETERIZATION WITH CORONARY ANGIOGRAM N/A 09/03/2013   Procedure: LEFT HEART CATHETERIZATION WITH CORONARY ANGIOGRAM;  Surgeon: Jettie Booze, MD;  Location[facility] C[facility];  Service: Cardiovascular;  Laterality: N/A;  . Right carpal tunnel release      Current Outpatient Medications  Medication Sig Dispense Refill  . acetaminophen (TYLENOL) 500 MG tablet Take 1 tablet (500 mg total) by mouth every 6 (six) hours as needed. 30 tablet 0  . AIMOVIG 70 MG/ML SOAJ INJECT 70 MG INTO THE SKIN EVERY 30 DAYS  1 mL 2  . apixaban (ELIQUIS) 5 MG TABS tablet Take 1 tablet (5 mg total) by mouth 2 (two) times daily. 60 tablet 3  . atorvastatin (LIPITOR) 80 MG tablet Take 1 tablet by mouth once daily 90 tablet 0  . colchicine 0.6 MG tablet Take 1 tablet (0.6 mg total) by mouth daily. 90 tablet 0  . cyclobenzaprine (FLEXERIL) 5 MG tablet Take 1 tablet (5 mg total) by mouth 3 (three)  times daily as needed. 30 tablet 0  . fluticasone (FLONASE) 50 MCG/ACT nasal spray Place 2 sprays into both nostrils daily.    Marland Kitchen ipratropium (ATROVENT) 0.03 % nasal spray Place 2 sprays into both nostrils every 12 (twelve) hours. 30 mL 3  . Iron-FA-B Cmp-C-Biot-Probiotic (FUSION PLUS) CAPS Take 2 tablets by mouth daily. 60 capsule 3  . losartan (COZAAR) 25 MG tablet Take 1 tablet (25 mg total) by mouth in the morning and at bedtime. 60 tablet 6  . metFORMIN (GLUCOPHAGE) 500 MG tablet TAKE 1 TABLET BY MOUTH TWICE DAILY WITH A MEAL 180 tablet 0  . metoCLOPramide (REGLAN) 10 MG tablet Take 1 tablet (10 mg total) by mouth every 8 (eight) hours as needed for up to 7 days for nausea (Headache). 21 tablet 0  . metoprolol succinate (TOPROL-XL) 50 MG 24 hr tablet Take 1 tablet (50 mg total) by mouth in the morning and at bedtime. Take with or immediately following a meal. 180 tablet 3  . ondansetron (ZOFRAN ODT) 4 MG disintegrating tablet Take 1 tablet (4 mg total) by mouth every 8 (eight) hours as needed. 20 tablet 6  . pantoprazole (PROTONIX) 40 MG tablet Take 1 tablet by mouth twice daily 180 tablet 0  . spironolactone (ALDACTONE) 25 MG tablet Take 0.5 tablets (12.5 mg total) by mouth daily. 45 tablet 3  . topiramate (TOPAMAX) 50 MG tablet Take 1.5 tablets (75 mg total) by mouth at bedtime. 135 tablet 3  . Ubrogepant (UBRELVY) 50 MG TABS Take 1 tablet at the onset of a migraine.  Can repeat in 2 hours if needed. 15 tablet 5   No current facility-administered medications for this visit.   Allergies:  Venlafaxine   ROS: No sudden unexplained syncope.  Physical Exam: VS:  BP (!) 150/90   Pulse 99   Ht 5\' 2"  (1.575 m)   Wt 278 lb (126.1 kg)   SpO2 97%   BMI 50.85 kg/m , BMI Body mass index is 50.85 kg/m.  Wt Readings from Last 3 Encounters:  07/10/20 278 lb (126.1 kg)  06/12/20 274 lb (124.3 kg)  05/26/20 274 lb (124.3 kg)    General: Patient appears comfortable at rest. HEENT: Conjunctiva  and lids normal, wearing a mask. Neck: Supple, no elevated JVP or carotid bruits, no thyromegaly. Lungs: Clear to auscultation, nonlabored breathing at rest. Cardiac: Regular rate and rhythm, no S3, 2/6 systolic murmur, no pericardial rub. Abdomen: Soft, nontender, bowel sounds present. Extremities: No pitting edema, distal pulses 2+.  ECG:  An ECG dated 05/13/2020 was personally reviewed today and demonstrated:  Sinus rhythm with right bundle branch block, inferolateral ST segment elevation.  Recent Labwork: 10/15/2019: Magnesium 1.8 05/13/2020: B Natriuretic Peptide 1,175.0; TSH 3.035 07/09/2020: ALT 39; AST 24; BUN 17; Creatinine, Ser 0.76; Hemoglobin 12.8; Platelets 224; Potassium 3.9; Sodium 139     Component Value Date/Time   CHOL 162 05/13/2020 1005   TRIG 156 (H) 05/13/2020 1005   HDL 24 (L) 05/13/2020 1005   CHOLHDL 6.8 05/13/2020 1005  VLDL 31 05/13/2020 1005   LDLCALC 107 (H) 05/13/2020 1005   LDLCALC 107 (H) 12/18/2019 0822    Other Studies Reviewed Today:  Cardiac MRI 05/15/2020: IMPRESSION: 1. Findings consistent with acute myocarditis including markedly elevated native T1 (up to 1447ms), T2 (up to 76 ms), and ECV (up to 64%). In addition there is extensive/diffuse late gadolinium enhancement.  2. While clearly has acute myocarditis, ECV is higher and LGE more extensive than typically seen in myocarditis. This could just be due to severe inflammation from myocarditis, or could have an underlying cardiomyopathy (does have severe asymmetric LVH that was present on prior echo 03/2019, and given ECV/LGE pattern, underlying amyloid is a consideration) with myocarditis in addition to this. If having ventricular arrhythmias, high degree AV block, or hemodynamic instability, would consider endomyocardial biopsy for further evaluation.  3. Normal LV size, severe asymmetric hypertrophy measuring up to 2mm in basal septum (46mm in posterior wall), and mild  systolic dysfunction (EF 10%). Unfortunately basal most LV short axis slice was not included in the data set to calculate EF, which introduces a small amount of error in the EF calculation, though visually EF appears 40-45%  4. RV volumes were not able to be calculated as several basal short axis slices were missing from the data set. Visually RV appears normal size with mild systolic dysfunction (apical akinesis)  5. Moderate pericardial effusion, measures up to 77mm adjacent to LV inferior wall  Echocardiogram 06/23/2020: 1. Limited study.  2. Left ventricular ejection fraction, by estimation, is approximately  55%. The left ventricle has normal function. The left ventricle has no  regional wall motion abnormalities. There is severe asymmetric left  ventricular hypertrophy of the basal and  septal segments, otherwise moderate.  3. Right ventricular systolic function is normal. The right ventricular  size is normal.  4. There is a trivial pericardial effusion anterior to the right  ventricle. This has decreased in size in comparison to the prior study in  January 2022.  5. The mitral valve is grossly normal. Mild mitral valve regurgitation.  6. The inferior vena cava is normal in size with greater than 50%  respiratory variability, suggesting right atrial pressure of 3 mmHg.   Assessment and Plan:  1.  History of acute viral myocarditis (RSV) in January of this year with normalization of LVEF by follow-up echocardiogram in February.  RV function also normal.  No active chest pain.  She has been on colchicine since January, likely 71-month course.  Previously documented moderate pericardial effusion improved significantly, only trivial by most recent assessment.  2.  Severe asymmetric septal hypertrophy.  Cardiac MRI discussed above with concern for potential infiltrative process, presumably unassociated with her myocarditis.  No family history of sudden cardiac death or HOCM.   I would like to have her evaluated in the heart failure clinic.  Increase Toprol-XL to 50 mg twice daily, continue losartan.  3.  Essential hypertension, blood pressure remains elevated.  Adding Aldactone 12.5 mg daily.  Check BMET in 7 to 10 days.  4.  Paroxysmal atrial fibrillation.  Heart rate regular today.  She remains on Eliquis for stroke prophylaxis with CHA2DS2-VASc score of 5.  Medication Adjustments/Labs and Tests Ordered: Current medicines are reviewed at length with the patient today.  Concerns regarding medicines are outlined above.   Tests Ordered: Orders Placed This Encounter  Procedures  . Basic metabolic panel  . AMB referral to CHF clinic    Medication Changes: Meds ordered this encounter  Medications  . metoprolol succinate (TOPROL-XL) 50 MG 24 hr tablet    Sig: Take 1 tablet (50 mg total) by mouth in the morning and at bedtime. Take with or immediately following a meal.    Dispense:  180 tablet    Refill:  3  . spironolactone (ALDACTONE) 25 MG tablet    Sig: Take 0.5 tablets (12.5 mg total) by mouth daily.    Dispense:  45 tablet    Refill:  3    Disposition:  Follow up 1 month in the [place] office.  Signed, Satira Sark, MD, [facility] 07/10/2020 1:26 PM    [facility] [address], [place], [place] [address] Phone: [phone]; Fax: [phone]

## 2020-07-10 ENCOUNTER — Encounter: Payer: Self-pay | Admitting: *Deleted

## 2020-07-10 ENCOUNTER — Encounter: Payer: Self-pay | Admitting: Cardiology

## 2020-07-10 ENCOUNTER — Other Ambulatory Visit: Payer: Self-pay

## 2020-07-10 ENCOUNTER — Ambulatory Visit: Payer: BC Managed Care – PPO | Admitting: Cardiology

## 2020-07-10 VITALS — BP 150/90 | HR 99 | Ht 62.0 in | Wt 278.0 lb

## 2020-07-10 DIAGNOSIS — I422 Other hypertrophic cardiomyopathy: Secondary | ICD-10-CM | POA: Diagnosis not present

## 2020-07-10 DIAGNOSIS — Z79899 Other long term (current) drug therapy: Secondary | ICD-10-CM | POA: Diagnosis not present

## 2020-07-10 DIAGNOSIS — I10 Essential (primary) hypertension: Secondary | ICD-10-CM | POA: Diagnosis not present

## 2020-07-10 DIAGNOSIS — Z8679 Personal history of other diseases of the circulatory system: Secondary | ICD-10-CM

## 2020-07-10 DIAGNOSIS — I313 Pericardial effusion (noninflammatory): Secondary | ICD-10-CM

## 2020-07-10 DIAGNOSIS — I3139 Other pericardial effusion (noninflammatory): Secondary | ICD-10-CM

## 2020-07-10 MED ORDER — SPIRONOLACTONE 25 MG PO TABS: 12.5000 mg | ORAL_TABLET | Freq: Every day | ORAL | 3 refills | Status: DC

## 2020-07-10 MED ORDER — METOPROLOL SUCCINATE ER 50 MG PO TB24: 50.0000 mg | ORAL_TABLET | Freq: Two times a day (BID) | ORAL | 3 refills | Status: DC

## 2020-07-10 NOTE — Patient Instructions (Signed)
Medication Instructions:  Your physician has recommended you make the following change in your medication:   Increase Toprol XL to 50 mg Two Times Daily  Start Spironolactone 12.5 mg Daily   *If you need a refill on your cardiac medications before your next appointment, please call your pharmacy*   Lab Work: Your physician recommends that you return for lab work in: 7-10 Days   If you have labs (blood work) drawn today and your tests are completely normal, you will receive your results only by: Marland Kitchen MyChart Message (if you have MyChart) OR . A paper copy in the mail If you have any lab test that is abnormal or we need to change your treatment, we will call you to review the results.   Testing/Procedures: NONE     Follow-Up: At [facility], you and your health needs are our priority.  As part of our continuing mission to provide you with exceptional heart care, we have created designated Provider Care Teams.  These Care Teams include your primary Cardiologist (physician) and Advanced Practice Providers (APPs -  Physician Assistants and Nurse Practitioners) who all work together to provide you with the care you need, when you need it.  We recommend signing up for the patient portal called "MyChart".  Sign up information is provided on this After Visit Summary.  MyChart is used to connect with patients for Virtual Visits (Telemedicine).  Patients are able to view lab/test results, encounter notes, upcoming appointments, etc.  Non-urgent messages can be sent to your provider as well.   To learn more about what you can do with MyChart, go to [email or web address].    Your next appointment:   1 month(s)  The format for your next appointment:   In Person  Provider:   Rozann Lesches, MD or Bernerd Pho, PA-C   Other Instructions Thank you for choosing [facility]!

## 2020-07-14 ENCOUNTER — Ambulatory Visit: Payer: BC Managed Care – PPO | Admitting: Internal Medicine

## 2020-07-14 ENCOUNTER — Other Ambulatory Visit: Payer: Self-pay

## 2020-07-14 ENCOUNTER — Encounter: Payer: Self-pay | Admitting: Internal Medicine

## 2020-07-14 VITALS — BP 156/99 | HR 96 | Resp 18 | Ht 62.0 in | Wt 274.1 lb

## 2020-07-14 DIAGNOSIS — I480 Paroxysmal atrial fibrillation: Secondary | ICD-10-CM

## 2020-07-14 DIAGNOSIS — G4733 Obstructive sleep apnea (adult) (pediatric): Secondary | ICD-10-CM

## 2020-07-14 DIAGNOSIS — K219 Gastro-esophageal reflux disease without esophagitis: Secondary | ICD-10-CM

## 2020-07-14 DIAGNOSIS — I409 Acute myocarditis, unspecified: Secondary | ICD-10-CM

## 2020-07-14 DIAGNOSIS — E119 Type 2 diabetes mellitus without complications: Secondary | ICD-10-CM

## 2020-07-14 DIAGNOSIS — Z7689 Persons encountering health services in other specified circumstances: Secondary | ICD-10-CM | POA: Diagnosis not present

## 2020-07-14 DIAGNOSIS — E785 Hyperlipidemia, unspecified: Secondary | ICD-10-CM | POA: Diagnosis not present

## 2020-07-14 DIAGNOSIS — I10 Essential (primary) hypertension: Secondary | ICD-10-CM

## 2020-07-14 DIAGNOSIS — G43709 Chronic migraine without aura, not intractable, without status migrainosus: Secondary | ICD-10-CM

## 2020-07-14 NOTE — Assessment & Plan Note (Signed)
Diet modification advised Mobility limited due to exertional dyspnea 

## 2020-07-14 NOTE — Assessment & Plan Note (Signed)
Lab Results  Component Value Date   HGBA1C 6.4 (H) 05/13/2020   On Metformin On statin Has an Ophthalmologist

## 2020-07-14 NOTE — Assessment & Plan Note (Signed)
On Atorvastatin 

## 2020-07-14 NOTE — Assessment & Plan Note (Signed)
On Aimovig and Topamax Ubrelvy PRN Follows up with Neurology

## 2020-07-14 NOTE — Patient Instructions (Signed)
Please continue to take medications as prescribed.  Continue to follow low carbohydrate and low sodium diet.  Please follow up with your Cardiologist as scheduled.

## 2020-07-14 NOTE — Assessment & Plan Note (Signed)
Currently in sinus rhythm On Toprol and Eliquis Follows up with Cardiologist 

## 2020-07-14 NOTE — Assessment & Plan Note (Signed)
Uses CPAP 

## 2020-07-14 NOTE — Assessment & Plan Note (Signed)
In 05/2020, thought to be likely viral On Colchicine Going to see heart failure specialist

## 2020-07-14 NOTE — Progress Notes (Signed)
New Patient Office Visit  Subjective:  Patient ID: Monique Gomez, female    DOB: 1955/09/12  Age: 65 y.o. MRN: [id number]  CC:  Chief Complaint  Patient presents with  . New Patient (Initial Visit)    New patient has been out of work due to heart issues has been seeing dr Domenic Polite     HPI Monique Gomez is a 65 year old female with past medical history of paroxysmal atrial fibrillation, hypertension, myocarditis, OSA on CPAP, GERD, DM, HLD and morbid obesity who presents for establishing care.  She is a former patient of Dr. Buelah Manis.  She reports recent history of acute myocarditis, most likely viral.  She follows up with cardiologist and is scheduled to see a heart failure specialist.  She states that her fatigue and exertional dyspnea has been improving slowly.  She is still out of work due to persistent symptoms.  Her BP was elevated in the office today.  She is on losartan, and metoprolol and was recently on Spironolactone.  She denies any chest pain or palpitations.  She has a history of DM, for which she takes Metformin.  She denies any polyurea or polydipsia.  She has h/o migraine, for which she takes Aimovig, Topamax and PRN Ubrelvy. She follows up with Neurology.  She uses CPAP for OSA.  She has had 2 doses of COVID vaccine.  Past Medical History:  Diagnosis Date  . Abdominal fibromatosis   . ACS (acute coronary syndrome) ([place])   . Allergic rhinitis   . Anemia   . Asthma   . Essential hypertension   . GERD (gastroesophageal reflux disease)   . Headache(784.0)   . History of cardiac catheterization    No significant CAD May 2015  . History of migraine headaches   . Hyperlipidemia   . Migraines   . Myocarditis ([place])    a. diagnosed in 05/2020 with cath showing normal cors  . Obesity   . PAF (paroxysmal atrial fibrillation) ([place]) 08/2013  . Sleep apnea   . Type 2 diabetes mellitus ([place])     Past Surgical History:  Procedure Laterality Date  . CATARACT  EXTRACTION    . CATARACT EXTRACTION W/PHACO Right 04/05/2019   Procedure: CATARACT EXTRACTION PHACO AND INTRAOCULAR LENS PLACEMENT RIGHT EYE (CDE: 3.19);  Surgeon: Baruch Goldmann, MD;  Location: [facility];  Service: Ophthalmology;  Laterality: Right;  . COLONOSCOPY N/A 02/02/2015   SLF: 1. one colon polyp removed-no source for anemia identified. 2. moderate diverticulosis noted in the sigmoid colon and descending colon 3. the left colon is redundant 4. Rectal bleeding due ot small internal hemorroids 5. Moderate sized external hemorrhoids.  . ESOPHAGOGASTRODUODENOSCOPY N/A 02/02/2015   SLF: 1. 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 large hiatal hernia 3. mild non-erosive gastritis 4. No source for anemia identified.   Marland Kitchen LEFT HEART CATH AND CORONARY ANGIOGRAPHY N/A 05/14/2020   Procedure: LEFT HEART CATH AND CORONARY ANGIOGRAPHY;  Surgeon: Belva Crome, MD;  Location: [facility];  Service: Cardiovascular;  Laterality: N/A;  . LEFT HEART CATHETERIZATION WITH CORONARY ANGIOGRAM N/A 09/03/2013   Procedure: LEFT HEART CATHETERIZATION WITH CORONARY ANGIOGRAM;  Surgeon: Jettie Booze, MD;  Location: [facility];  Service: Cardiovascular;  Laterality: N/A;  . Right carpal tunnel release      Family History  Problem Relation Age of Onset  . Diabetes Mother   . Hypertension Mother   . Heart disease Mother   . High Cholesterol Mother   .  Alzheimer's disease Father   . Hypertension Sister   . Hypertension Brother   . Diabetes Brother   . Arthritis Sister   . Hypertension Sister   . High Cholesterol Sister   . Diabetes Son   . Developmental delay Son   . Colon cancer Neg Hx     Social History   Socioeconomic History  . Marital status: Single    Spouse name: Not on file  . Number of children: 2  . Years of education: Not on file  . Highest education level: Not on file  Occupational History  . Occupation: Training and development officer: [employer]  Tobacco Use  .  Smoking status: Former Smoker    Packs/day: 0.30    Types: Cigarettes    Quit date: 04/01/2012    Years since quitting: 8.2  . Smokeless tobacco: Never Used  Vaping Use  . Vaping Use: Never used  Substance and Sexual Activity  . Alcohol use: Not Currently    Alcohol/week: 0.0 standard drinks    Comment: very occasional  . Drug use: No  . Sexual activity: Yes  Other Topics Concern  . Not on file  Social History Narrative  . Not on file   Social Determinants of Health   Financial Resource Strain: Not on file  Food Insecurity: Not on file  Transportation Needs: Not on file  Physical Activity: Not on file  Stress: Not on file  Social Connections: Not on file  Intimate Partner Violence: Not on file    ROS Review of Systems  Constitutional: Positive for activity change and fatigue. Negative for chills and fever.  HENT: Negative for congestion, sinus pressure, sinus pain and sore throat.   Eyes: Negative for pain and discharge.  Respiratory: Positive for chest tightness and shortness of breath. Negative for cough.   Cardiovascular: Negative for chest pain and palpitations.  Gastrointestinal: Negative for abdominal pain, constipation, diarrhea, nausea and vomiting.  Endocrine: Negative for polydipsia and polyuria.  Genitourinary: Negative for dysuria and hematuria.  Musculoskeletal: Negative for neck pain and neck stiffness.  Skin: Negative for rash.  Neurological: Negative for dizziness and weakness.  Psychiatric/Behavioral: Negative for agitation and behavioral problems.    Objective:   Today's Vitals: BP (!) 156/99 (BP Location: Right Arm, Patient Position: Sitting, Cuff Size: Normal)   Pulse 96   Resp 18   Ht 5\' 2"  (1.575 m)   Wt 274 lb 1.9 oz (124.3 kg)   SpO2 98%   BMI 50.14 kg/m   Physical Exam Vitals reviewed.  Constitutional:      General: She is not in acute distress.    Appearance: She is not diaphoretic.  HENT:     Head: Normocephalic and atraumatic.      Nose: Nose normal.     Mouth/Throat:     Mouth: Mucous membranes are moist.  Eyes:     General: No scleral icterus.    Extraocular Movements: Extraocular movements intact.     Pupils: Pupils are equal, round, and reactive to light.  Cardiovascular:     Rate and Rhythm: Normal rate and regular rhythm.     Pulses: Normal pulses.     Heart sounds: Normal heart sounds. No murmur heard.   Pulmonary:     Breath sounds: Normal breath sounds. No wheezing or rales.  Abdominal:     Palpations: Abdomen is soft.     Tenderness: There is no abdominal tenderness.  Musculoskeletal:     Cervical back:  Neck supple. No tenderness.     Right lower leg: No edema.     Left lower leg: No edema.  Skin:    General: Skin is warm.     Findings: No rash.  Neurological:     General: No focal deficit present.     Mental Status: She is alert and oriented to person, place, and time.  Psychiatric:        Mood and Affect: Mood normal.        Behavior: Behavior normal.     Assessment & Plan:   Problem List Items Addressed This Visit      Encounter to establish care    -  Primary Care established History and medications reviewed with the patient    Cardiovascular and Mediastinum   Migraine    On Aimovig and Topamax Ubrelvy PRN Follows up with Neurology      HTN (hypertension)    BP Readings from Last 1 Encounters:  07/14/20 (!) 156/99   Uncontrolled On Losartan and Metoprolol, recently started Spironolactone Counseled for compliance with the medications Advised DASH diet and moderate exercise/walking, at least 150 mins/week       PAF (paroxysmal atrial fibrillation) ([place])    Currently in sinus rhythm On Toprol and Eliquis Follows up with Cardiologist      Myocarditis ([place])    In 05/2020, thought to be likely viral On Colchicine Going to see heart failure specialist        Respiratory   Obstructive sleep apnea    Uses CPAP        Digestive   GERD (gastroesophageal  reflux disease)    On Pantoprazole        Endocrine   DM (diabetes mellitus) ([place])    Lab Results  Component Value Date   HGBA1C 6.4 (H) 05/13/2020   On Metformin On statin Has an Ophthalmologist        Other   Hyperlipidemia    On Atorvastatin      Morbid obesity ([place])    Diet modification advised Mobility limited due to exertional dyspnea       Outpatient Encounter Medications as of 07/14/2020  Medication Sig  . acetaminophen (TYLENOL) 500 MG tablet Take 1 tablet (500 mg total) by mouth every 6 (six) hours as needed.  Marland Kitchen AIMOVIG 70 MG/ML SOAJ INJECT 70 MG INTO THE SKIN EVERY 30 DAYS  . apixaban (ELIQUIS) 5 MG TABS tablet Take 1 tablet (5 mg total) by mouth 2 (two) times daily.  Marland Kitchen atorvastatin (LIPITOR) 80 MG tablet Take 1 tablet by mouth once daily  . colchicine 0.6 MG tablet Take 1 tablet (0.6 mg total) by mouth daily.  . cyclobenzaprine (FLEXERIL) 5 MG tablet Take 1 tablet (5 mg total) by mouth 3 (three) times daily as needed.  . fluticasone (FLONASE) 50 MCG/ACT nasal spray Place 2 sprays into both nostrils daily.  Marland Kitchen ipratropium (ATROVENT) 0.03 % nasal spray Place 2 sprays into both nostrils every 12 (twelve) hours.  . Iron-FA-B Cmp-C-Biot-Probiotic (FUSION PLUS) CAPS Take 2 tablets by mouth daily.  Marland Kitchen losartan (COZAAR) 25 MG tablet Take 1 tablet (25 mg total) by mouth in the morning and at bedtime.  . metFORMIN (GLUCOPHAGE) 500 MG tablet TAKE 1 TABLET BY MOUTH TWICE DAILY WITH A MEAL  . metoprolol succinate (TOPROL-XL) 50 MG 24 hr tablet Take 1 tablet (50 mg total) by mouth in the morning and at bedtime. Take with or immediately following a meal.  . ondansetron (ZOFRAN ODT)  4 MG disintegrating tablet Take 1 tablet (4 mg total) by mouth every 8 (eight) hours as needed.  . pantoprazole (PROTONIX) 40 MG tablet Take 1 tablet by mouth twice daily  . spironolactone (ALDACTONE) 25 MG tablet Take 0.5 tablets (12.5 mg total) by mouth daily.  Marland Kitchen topiramate (TOPAMAX) 50 MG tablet  Take 1.5 tablets (75 mg total) by mouth at bedtime.  Marland Kitchen Ubrogepant (UBRELVY) 50 MG TABS Take 1 tablet at the onset of a migraine.  Can repeat in 2 hours if needed.  . metoCLOPramide (REGLAN) 10 MG tablet Take 1 tablet (10 mg total) by mouth every 8 (eight) hours as needed for up to 7 days for nausea (Headache). (Patient not taking: Reported on 07/14/2020)   No facility-administered encounter medications on file as of 07/14/2020.    Follow-up: Return in about 4 months (around 11/13/2020) for Annual physical.   Lindell Spar, MD

## 2020-07-14 NOTE — Assessment & Plan Note (Signed)
On Pantoprazole 

## 2020-07-14 NOTE — Assessment & Plan Note (Signed)
BP Readings from Last 1 Encounters:  07/14/20 (!) 156/99   Uncontrolled On Losartan and Metoprolol, recently started Spironolactone Counseled for compliance with the medications Advised DASH diet and moderate exercise/walking, at least 150 mins/week

## 2020-07-16 ENCOUNTER — Inpatient Hospital Stay (HOSPITAL_BASED_OUTPATIENT_CLINIC_OR_DEPARTMENT_OTHER): Payer: BC Managed Care – PPO | Admitting: Hematology

## 2020-07-16 ENCOUNTER — Other Ambulatory Visit: Payer: Self-pay

## 2020-07-16 VITALS — BP 124/93 | HR 92 | Temp 96.8°F | Resp 18 | Wt 274.4 lb

## 2020-07-16 DIAGNOSIS — Z7984 Long term (current) use of oral hypoglycemic drugs: Secondary | ICD-10-CM | POA: Diagnosis not present

## 2020-07-16 DIAGNOSIS — R591 Generalized enlarged lymph nodes: Secondary | ICD-10-CM

## 2020-07-16 DIAGNOSIS — D509 Iron deficiency anemia, unspecified: Secondary | ICD-10-CM | POA: Diagnosis not present

## 2020-07-16 DIAGNOSIS — Z7901 Long term (current) use of anticoagulants: Secondary | ICD-10-CM | POA: Diagnosis not present

## 2020-07-16 DIAGNOSIS — Z8249 Family history of ischemic heart disease and other diseases of the circulatory system: Secondary | ICD-10-CM | POA: Diagnosis not present

## 2020-07-16 DIAGNOSIS — Z833 Family history of diabetes mellitus: Secondary | ICD-10-CM | POA: Diagnosis not present

## 2020-07-16 DIAGNOSIS — E119 Type 2 diabetes mellitus without complications: Secondary | ICD-10-CM | POA: Diagnosis not present

## 2020-07-16 DIAGNOSIS — Z79899 Other long term (current) drug therapy: Secondary | ICD-10-CM | POA: Diagnosis not present

## 2020-07-16 DIAGNOSIS — Z87891 Personal history of nicotine dependence: Secondary | ICD-10-CM | POA: Diagnosis not present

## 2020-07-16 DIAGNOSIS — I10 Essential (primary) hypertension: Secondary | ICD-10-CM | POA: Diagnosis not present

## 2020-07-16 DIAGNOSIS — J45909 Unspecified asthma, uncomplicated: Secondary | ICD-10-CM | POA: Diagnosis not present

## 2020-07-16 DIAGNOSIS — Z8349 Family history of other endocrine, nutritional and metabolic diseases: Secondary | ICD-10-CM | POA: Diagnosis not present

## 2020-07-16 DIAGNOSIS — Z8261 Family history of arthritis: Secondary | ICD-10-CM | POA: Diagnosis not present

## 2020-07-16 DIAGNOSIS — G4730 Sleep apnea, unspecified: Secondary | ICD-10-CM | POA: Diagnosis not present

## 2020-07-16 DIAGNOSIS — I480 Paroxysmal atrial fibrillation: Secondary | ICD-10-CM | POA: Diagnosis not present

## 2020-07-16 NOTE — Progress Notes (Signed)
[place], [place] [address]   CLINIC:  Medical Oncology/Hematology  PCP:  Monique Spar, MD [address] / [place] [address]  [phone]  REASON FOR VISIT:  Follow-up for lymphadenopathy and IDA  PRIOR THERAPY: None  CURRENT THERAPY: Intermittent Feraheme last on 05/17/2020; iron tablet daily  INTERVAL HISTORY:  Ms. ELLIOTT Gomez, a 65 y.o. female, returns for routine follow-up for her lymphadenopathy. Monique Gomez was last seen on 01/15/2020.  Today she reports feeling okay. She reports having SOB and sweating with minimal activity, but denies having F/C, night sweats, unexplained weight loss or new adenopathy. She is taking iron tablets which she has been taking for years and she denies having melena or hematochezia.   REVIEW OF SYSTEMS:  Review of Systems  Constitutional: Positive for diaphoresis (w/ activity) and fatigue (50%). Negative for appetite change, chills, fever and unexpected weight change.  Respiratory: Positive for shortness of breath (w/ minimal exertion).   Gastrointestinal: Negative for blood in stool.  Musculoskeletal: Positive for neck pain (4/10 neck pain).  Neurological: Positive for headaches.  Hematological: Negative for adenopathy.  All other systems reviewed and are negative.   PAST MEDICAL/SURGICAL HISTORY:  Past Medical History:  Diagnosis Date  . Abdominal fibromatosis   . ACS (acute coronary syndrome) ([place])   . Allergic rhinitis   . Anemia   . Asthma   . Essential hypertension   . GERD (gastroesophageal reflux disease)   . Headache(784.0)   . History of cardiac catheterization    No significant CAD May 2015  . History of migraine headaches   . Hyperlipidemia   . Migraines   . Myocarditis ([place])    a. diagnosed in 05/2020 with cath showing normal cors  . Obesity   . PAF (paroxysmal atrial fibrillation) ([place]) 08/2013  . Sleep apnea   . Type 2 diabetes mellitus ([place])    Past Surgical  History:  Procedure Laterality Date  . CATARACT EXTRACTION    . CATARACT EXTRACTION W/PHACO Right 04/05/2019   Procedure: CATARACT EXTRACTION PHACO AND INTRAOCULAR LENS PLACEMENT RIGHT EYE (CDE: 3.19);  Surgeon: Baruch Goldmann, MD;  Location: [facility];  Service: Ophthalmology;  Laterality: Right;  . COLONOSCOPY N/A 02/02/2015   SLF: 1. one colon polyp removed-no source for anemia identified. 2. moderate diverticulosis noted in the sigmoid colon and descending colon 3. the left colon is redundant 4. Rectal bleeding due ot small internal hemorroids 5. Moderate sized external hemorrhoids.  . ESOPHAGOGASTRODUODENOSCOPY N/A 02/02/2015   SLF: 1. 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 large hiatal hernia 3. mild non-erosive gastritis 4. No source for anemia identified.   Marland Kitchen LEFT HEART CATH AND CORONARY ANGIOGRAPHY N/A 05/14/2020   Procedure: LEFT HEART CATH AND CORONARY ANGIOGRAPHY;  Surgeon: Belva Crome, MD;  Location: [facility];  Service: Cardiovascular;  Laterality: N/A;  . LEFT HEART CATHETERIZATION WITH CORONARY ANGIOGRAM N/A 09/03/2013   Procedure: LEFT HEART CATHETERIZATION WITH CORONARY ANGIOGRAM;  Surgeon: Jettie Booze, MD;  Location: [facility] CATH LAB;  Service: Cardiovascular;  Laterality: N/A;  . Right carpal tunnel release      SOCIAL HISTORY:  Social History   Socioeconomic History  . Marital status: Single    Spouse name: Not on file  . Number of children: 2  . Years of education: Not on file  . Highest education level: Not on file  Occupational History  . Occupation: Training and development officer: [employer]  Tobacco Use  . Smoking status: Former Smoker    Packs/day: 0.30    Types: Cigarettes    Quit date: 04/01/2012    Years since quitting: 8.2  . Smokeless tobacco: Never Used  Vaping Use  . Vaping Use: Never used  Substance and Sexual Activity  . Alcohol use: Not Currently    Alcohol/week: 0.0 standard drinks    Comment: very occasional  . Drug  use: No  . Sexual activity: Yes  Other Topics Concern  . Not on file  Social History Narrative  . Not on file   Social Determinants of Health   Financial Resource Strain: Not on file  Food Insecurity: Not on file  Transportation Needs: Not on file  Physical Activity: Not on file  Stress: Not on file  Social Connections: Not on file  Intimate Partner Violence: Not on file    FAMILY HISTORY:  Family History  Problem Relation Age of Onset  . Diabetes Mother   . Hypertension Mother   . Heart disease Mother   . High Cholesterol Mother   . Alzheimer's disease Father   . Hypertension Sister   . Hypertension Brother   . Diabetes Brother   . Arthritis Sister   . Hypertension Sister   . High Cholesterol Sister   . Diabetes Son   . Developmental delay Son   . Colon cancer Neg Hx     CURRENT MEDICATIONS:  Current Outpatient Medications  Medication Sig Dispense Refill  . acetaminophen (TYLENOL) 500 MG tablet Take 1 tablet (500 mg total) by mouth every 6 (six) hours as needed. 30 tablet 0  . AIMOVIG 70 MG/ML SOAJ INJECT 70 MG INTO THE SKIN EVERY 30 DAYS 1 mL 2  . apixaban (ELIQUIS) 5 MG TABS tablet Take 1 tablet (5 mg total) by mouth 2 (two) times daily. 60 tablet 3  . atorvastatin (LIPITOR) 80 MG tablet Take 1 tablet by mouth once daily 90 tablet 0  . colchicine 0.6 MG tablet Take 1 tablet (0.6 mg total) by mouth daily. 90 tablet 0  . cyclobenzaprine (FLEXERIL) 5 MG tablet Take 1 tablet (5 mg total) by mouth 3 (three) times daily as needed. 30 tablet 0  . fluticasone (FLONASE) 50 MCG/ACT nasal spray Place 2 sprays into both nostrils daily.    Marland Kitchen ipratropium (ATROVENT) 0.03 % nasal spray Place 2 sprays into both nostrils every 12 (twelve) hours. 30 mL 3  . Iron-FA-B Cmp-C-Biot-Probiotic (FUSION PLUS) CAPS Take 2 tablets by mouth daily. 60 capsule 3  . losartan (COZAAR) 25 MG tablet Take 1 tablet (25 mg total) by mouth in the morning and at bedtime. 60 tablet 6  . metFORMIN  (GLUCOPHAGE) 500 MG tablet TAKE 1 TABLET BY MOUTH TWICE DAILY WITH A MEAL 180 tablet 0  . metoCLOPramide (REGLAN) 10 MG tablet Take 1 tablet (10 mg total) by mouth every 8 (eight) hours as needed for up to 7 days for nausea (Headache). 21 tablet 0  . metoprolol succinate (TOPROL-XL) 50 MG 24 hr tablet Take 1 tablet (50 mg total) by mouth in the morning and at bedtime. Take with or immediately following a meal. 180 tablet 3  . ondansetron (ZOFRAN ODT) 4 MG disintegrating tablet Take 1 tablet (4 mg total) by mouth every 8 (eight) hours as needed. 20 tablet 6  . pantoprazole (PROTONIX) 40 MG tablet Take 1 tablet by mouth twice daily 180 tablet 0  . spironolactone (ALDACTONE) 25 MG tablet Take 0.5 tablets (12.5 mg  total) by mouth daily. 45 tablet 3  . topiramate (TOPAMAX) 50 MG tablet Take 1.5 tablets (75 mg total) by mouth at bedtime. 135 tablet 3  . Ubrogepant (UBRELVY) 50 MG TABS Take 1 tablet at the onset of a migraine.  Can repeat in 2 hours if needed. 15 tablet 5   No current facility-administered medications for this visit.    ALLERGIES:  Allergies  Allergen Reactions  . Venlafaxine Nausea And Vomiting and Other (See Comments)    Dizziness, shakiness    PHYSICAL EXAM:  Performance status (ECOG): 0 - Asymptomatic  Vitals:   07/16/20 0810  BP: (!) 124/93  Pulse: 92  Resp: 18  Temp: (!) 96.8 F (36 C)  SpO2: 99%   Wt Readings from Last 3 Encounters:  07/16/20 274 lb 6.4 oz (124.5 kg)  07/14/20 274 lb 1.9 oz (124.3 kg)  07/10/20 278 lb (126.1 kg)   Physical Exam Vitals reviewed.  Constitutional:      Appearance: Normal appearance. She is obese.  Cardiovascular:     Rate and Rhythm: Normal rate and regular rhythm.     Pulses: Normal pulses.     Heart sounds: Normal heart sounds.  Pulmonary:     Effort: Pulmonary effort is normal.     Breath sounds: Normal breath sounds.  Chest:  Breasts:     Right: No axillary adenopathy or supraclavicular adenopathy.     Left: No  axillary adenopathy or supraclavicular adenopathy.    Abdominal:     Palpations: Abdomen is soft. There is no hepatomegaly, splenomegaly or mass.     Tenderness: There is no abdominal tenderness.     Hernia: No hernia is present.  Musculoskeletal:     Right lower leg: No edema.     Left lower leg: No edema.  Lymphadenopathy:     Head:     Right side of head: No submandibular adenopathy.     Left side of head: No submandibular adenopathy.     Cervical: No cervical adenopathy.     Upper Body:     Right upper body: No supraclavicular, axillary or pectoral adenopathy.     Left upper body: No supraclavicular, axillary or pectoral adenopathy.     Lower Body: No right inguinal adenopathy. No left inguinal adenopathy.  Neurological:     General: No focal deficit present.     Mental Status: She is alert and oriented to person, place, and time.  Psychiatric:        Mood and Affect: Mood normal.        Behavior: Behavior normal.     LABORATORY DATA:  I have reviewed the labs as listed.  CBC Latest Ref Rng & Units 07/09/2020 05/19/2020 05/18/2020  WBC 4.0 - 10.5 K/uL 7.7 6.6 7.5  Hemoglobin 12.0 - 15.0 g/dL 12.8 9.6(L) 10.1(L)  Hematocrit 36.0 - 46.0 % 43.1 30.9(L) 31.4(L)  Platelets 150 - 400 K/uL 224 373 390   CMP Latest Ref Rng & Units 07/09/2020 05/17/2020 05/15/2020  Glucose 70 - 99 mg/dL 90 161(H) 100(H)  BUN 8 - 23 mg/dL 17 15 15   Creatinine 0.44 - 1.00 mg/dL 0.76 1.05(H) 0.92  Sodium 135 - 145 mmol/L 139 142 134(L)  Potassium 3.5 - 5.1 mmol/L 3.9 3.7 3.6  Chloride 98 - 111 mmol/L 106 108 105  CO2 22 - 32 mmol/L 24 23 19(L)  Calcium 8.9 - 10.3 mg/dL 9.6 8.9 8.5(L)  Total Protein 6.5 - 8.1 g/dL 7.4 - -  Total Bilirubin 0.3 -  1.2 mg/dL 0.4 - -  Alkaline Phos 38 - 126 U/L 70 - -  AST 15 - 41 U/L 24 - -  ALT 0 - 44 U/L 39 - -      Component Value Date/Time   RBC 5.95 (H) 07/09/2020 1152   MCV 72.4 (L) 07/09/2020 1152   MCV 75 (L) 07/17/2019 1148   MCH 21.5 (L) 07/09/2020 1152    MCHC 29.7 (L) 07/09/2020 1152   RDW 22.9 (H) 07/09/2020 1152   RDW 14.7 07/17/2019 1148   LYMPHSABS 2.4 07/09/2020 1152   LYMPHSABS 1.8 07/17/2019 1148   MONOABS 0.7 07/09/2020 1152   EOSABS 0.3 07/09/2020 1152   EOSABS 0.2 07/17/2019 1148   BASOSABS 0.0 07/09/2020 1152   BASOSABS 0.0 07/17/2019 1148   Lab Results  Component Value Date   LDH 126 07/09/2020   LDH 115 10/15/2019   Lab Results  Component Value Date   TIBC 361 07/09/2020   TIBC 340 05/15/2020   TIBC 346 10/09/2018   FERRITIN 35 07/09/2020   FERRITIN 54 05/15/2020   FERRITIN 75 10/09/2018   IRONPCTSAT 22 07/09/2020   IRONPCTSAT 6 (L) 05/15/2020   IRONPCTSAT 13 (L) 10/09/2018    DIAGNOSTIC IMAGING:  I have independently reviewed the scans and discussed with the patient. ECHOCARDIOGRAM LIMITED  Result Date: 06/23/2020    ECHOCARDIOGRAM LIMITED REPORT   Patient Name:   Monique Gomez [facility] Date of Exam: 06/23/2020 Medical Rec #:  [id number]         Height:       62.0 in Accession #:    [id number]        Weight:       274.0 lb Date of Birth:  1955-06-19         BSA:          2.185 m Patient Age:    66 years          BP:           139/97 mmHg Patient Gender: F                 HR:           86 bpm. Exam Location:  [facility] Procedure: Limited Echo, Cardiac Doppler and Limited Color Doppler Indications:    I40.0 (ICD-10-CM) - Acute viral myocarditis, Pericardial                 effusion [203045], assess LV Function  History:        Patient has prior history of Echocardiogram examinations, most                 recent 05/13/2020. Previous Myocardial Infarction,                 Arrythmias:Atrial Fibrillation; Risk Factors:Hypertension,                 Dyslipidemia and Diabetes. Class 3 obesity, Myocarditis ([place])                 (From Hx).  Sonographer:    Alvino Chapel RCS Referring Phys: [id number] [place]  1. Limited study.  2. Left ventricular ejection fraction, by estimation, is approximately 55%. The left  ventricle has normal function. The left ventricle has no regional wall motion abnormalities. There is severe asymmetric left ventricular hypertrophy of the basal and septal segments, otherwise moderate.  3. Right ventricular systolic function is normal. The right ventricular size is normal.  4.  There is a trivial pericardial effusion anterior to the right ventricle. This has decreased in size in comparison to the prior study in January 2022.  5. The mitral valve is grossly normal. Mild mitral valve regurgitation.  6. The inferior vena cava is normal in size with greater than 50% respiratory variability, suggesting right atrial pressure of 3 mmHg. FINDINGS  Left Ventricle: Left ventricular ejection fraction, by estimation, is 55%. The left ventricle has normal function. The left ventricle has no regional wall motion abnormalities. The left ventricular internal cavity size was normal in size. There is severe asymmetric left ventricular hypertrophy of the basal and septal segments. Right Ventricle: The right ventricular size is normal. No increase in right ventricular wall thickness. Right ventricular systolic function is normal. Pericardium: Trivial pericardial effusion is present. The pericardial effusion is anterior to the right ventricle. Mitral Valve: The mitral valve is grossly normal. Mild mitral valve regurgitation. Aorta: The aortic root is normal in size and structure. Venous: The inferior vena cava is normal in size with greater than 50% respiratory variability, suggesting right atrial pressure of 3 mmHg. IAS/Shunts: No atrial level shunt detected by color flow Doppler. LEFT VENTRICLE PLAX 2D LVIDd:         4.00 cm LVIDs:         2.50 cm LV PW:         1.80 cm LV IVS:        1.60 cm  LV Volumes (MOD) LV vol d, MOD A2C: 71.5 ml LV vol d, MOD A4C: 94.6 ml LV vol s, MOD A2C: 31.0 ml LV vol s, MOD A4C: 45.9 ml LV SV MOD A2C:     40.5 ml LV SV MOD A4C:     94.6 ml LV SV MOD BP:      41.9 ml LEFT ATRIUM          Index LA diam:    3.70 cm 1.69 cm/m   AORTA Ao Root diam: 3.20 cm MITRAL VALVE MV Area (PHT): 2.09 cm MV Decel Time: 363 msec MV E velocity: 57.40 cm/s MV A velocity: 94.60 cm/s MV E/A ratio:  0.61 Rozann Lesches MD Electronically signed by Rozann Lesches MD Signature Date/Time: 06/23/2020/12:12:21 PM    Final      ASSESSMENT:  1. Subcentimeter left supraclavicular adenopathy: -She reported lymphadenopathy for the past 6 months. She reported onset of sinus infections. -Ultrasound soft tissue neck on 01/25/2019 shows left supraclavicular lymph node measuring 1 cm. A CT of the soft tissue of the neck on the same day showed 3 small supraclavicular lymph nodes, largest measuring 8-9 mm. Small posterior triangle lymph node, none more than 9 mm. Consistent with mild reactive adenitis. -CT soft tissue neck with contrast on 02/22/2019 showed right level 2 lymph node measuring 6 mm unchanged. Subcentimeter posterior triangle lymph nodes on the right unchanged. Largest measures 8 mm. Left level 2 lymph node measures 8.6 mm unchanged. Cluster of supraclavicular lymph nodes on the left also slightly smaller. Largest measures 5 mm.No oropharyngeal masses. -CT soft tissue neck on 06/17/2019 shows left level 2 lymph node measuring 1.2 cm, previously 1.1 cm. Stable cluster of nonenlarged left supraclavicular lymph nodes. -She smoked 1 pack of cigarettes per week for 35 years. She quit smoking in 2013.   PLAN:  1. Waxing and waning neck and supraclavicular adenopathy: -She does not report any fevers, night sweats or weight loss in the last 6 months. -Physical examination today did not reveal any palpable adenopathy in  the neck region or axillary region. -We also reviewed CT angiogram from 05/13/2020 which showed no lymphadenopathy in the base of the neck.  No thoracic adenopathy.  Diffuse normal-sized mediastinal and axillary lymph nodes noted. -LDH is normal.  Recommend follow-up in 6 months.  2.   Iron deficiency anemia: -She received Feraheme on 05/17/2020 while she was hospitalized. -She reports taking iron tablet daily for many years. -Reviewed labs from 07/09/2020.  Ferritin is 35.  Hemoglobin 12.8 and MCV 72.4. 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 -She will continue oral iron therapy at this time.  We plan to repeat her labs in 3 months.  If there is no improvement or further worsening of ferritin will consider parenteral iron therapy.  Orders placed this encounter:  Orders Placed This Encounter  Procedures  . Ferritin  . Iron and TIBC  . CBC with Differential/Platelet     Derek Jack, MD [place] [phone]   I, Milinda Antis, am acting as a scribe for Dr. Sanda Linger.  I, Derek Jack MD, have reviewed the above documentation for accuracy and completeness, and I agree with the above.

## 2020-07-16 NOTE — Patient Instructions (Signed)
[facility] at [facility] Discharge Instructions  You were seen today by Dr. Katragadda. He went over your recent results and scans. Dr. Katragadda will see you back in 6 months for labs and follow up.   Thank you for choosing [facility] at [facility] to provide your oncology and hematology care.  To afford each patient quality time with our provider, please arrive at least 15 minutes before your scheduled appointment time.   If you have a lab appointment with the Cancer Center please come in thru the Main Entrance and check in at the main information desk  You need to re-schedule your appointment should you arrive 10 or more minutes late.  We strive to give you quality time with our providers, and arriving late affects you and other patients whose appointments are after yours.  Also, if you no show three or more times for appointments you may be dismissed from the clinic at the providers discretion.     Again, thank you for choosing [facility].  Our hope is that these requests will decrease the amount of time that you wait before being seen by our physicians.       _____________________________________________________________  Should you have questions after your visit to [facility], please contact our office at [phone] between the hours of 8:00 a.m. and 4:30 p.m.  Voicemails left after 4:00 p.m. will not be returned until the following business day.  For prescription refill requests, have your pharmacy contact our office and allow 72 hours.    Cancer Center Support Programs:   > Cancer Support Group  2nd Tuesday of the month 1pm-2pm, Journey Room   

## 2020-07-20 ENCOUNTER — Telehealth (INDEPENDENT_AMBULATORY_CARE_PROVIDER_SITE_OTHER): Payer: BC Managed Care – PPO | Admitting: Neurology

## 2020-07-20 ENCOUNTER — Encounter: Payer: Self-pay | Admitting: Neurology

## 2020-07-20 DIAGNOSIS — G43709 Chronic migraine without aura, not intractable, without status migrainosus: Secondary | ICD-10-CM

## 2020-07-20 MED ORDER — UBRELVY 50 MG PO TABS: ORAL_TABLET | ORAL | 5 refills | Status: DC

## 2020-07-20 MED ORDER — EMGALITY 120 MG/ML ~~LOC~~ SOAJ: 120.0000 mg | SUBCUTANEOUS | 11 refills | Status: DC

## 2020-07-20 MED ORDER — TOPIRAMATE 50 MG PO TABS: 75.0000 mg | ORAL_TABLET | Freq: Every day | ORAL | 3 refills | Status: DC

## 2020-07-20 NOTE — Progress Notes (Signed)
Virtual Visit via Video Note  I connected with Monique Gomez on 07/20/20 at  3:15 PM EDT by a video enabled telemedicine application and verified that I am speaking with the correct person using two identifiers.  Location: Patient: at her home Provider: in the office    I discussed the limitations of evaluation and management by telemedicine and the availability of in person appointments. The patient expressed understanding and agreed to proceed.  History of Present Illness: Update 07/20/20 SS: Here today for follow-up via virtual visit, currently taking Aimovig 70 mg monthly injection, Topamax 50 mg at bedtime, Ubrelvy as needed.  In January, she was diagnosed with viral myocarditis, has had significant fatigue, shortness of breath.  She has been out of work since January.  Her headaches were well controlled up until about a month ago, started having recurrence of her typical migraine headaches.  Taking daily Tylenol or Ubrelvy.  The headaches will initially improved, then will return a few hours later.  Feels benefit of Aimovig has worn off. Migraines rotate frontal area, occipital, can localize behind the left eye.  Has migraine features with significant headaches.  Update 01/21/2020 MM: [name] is a 65 year old female with a history of migraine headaches.  She returns today for follow-up.  She is currently taking 70 mg of Aimovig, Topamax 50 mg daily.  She uses sumatriptan for her migraine headaches.  She reports that starting several months back she began to have daily headaches.  She describes it as a pressure sensation associated with some vertigo as well.  She did go see her ENT as she thought it was sinus related.  She reports that her exam with them was relatively unremarkable.  They did give her some nasal spray to use however they felt that her symptoms was related to her headaches.  The patient states that the location of her headache varies.  With some headache she does have  photophobia.  Reports that she has been taking Tylenol daily until she spoke to the nurse at our office last week.  She states that she has tried to decrease her usage since then.  Reports that she does use sumatriptan on occasion and reports that it does not always offer her good benefit.  She returns today for an evaluation.  She has tried sumatriptan, Maxalt, diclofenac and Fioricet as abortive therapies   Observations/Objective: Via virtual, is alert and oriented, speech is clear and concise, facial symmetry noted, follows commands, moves extremities well, gait appears steady and intact  Assessment and Plan: 1.  Chronic migraine headaches  -Increase in migraines the last 1 month, feels Aimovig benefit has worn off -Stop Aimovig, switch to Emgality 120 mg monthly injection for migraine prevention, did not consider increase in Aimovig dosage due to risk of HTN, last BP was in the 150's, dealing with viral myocarditis -Continue Topamax, actually taking 50 mg at bedtime -Continue Ubrelvy as needed for acute headache -Recommend avoiding daily Tylenol use due to potential for rebound headache -Follow-up in 6 months or sooner if needed  Follow Up Instructions: 6 months 01/21/21 1:00 MM   I discussed the assessment and treatment plan with the patient. The patient was provided an opportunity to ask questions and all were answered. The patient agreed with the plan and demonstrated an understanding of the instructions.   The patient was advised to call back or seek an in-person evaluation if the symptoms worsen or if the condition fails to improve as anticipated.  I spent 20 minutes of face-to-face and non-face-to-face time with patient.  This included previsit chart review, lab review, study review, order entry, electronic health record documentation, patient education.  Evangeline Dakin, DNP  [facility] [address], [place], [place] [address] [phone]

## 2020-07-21 ENCOUNTER — Telehealth: Payer: Self-pay | Admitting: *Deleted

## 2020-07-21 NOTE — Telephone Encounter (Signed)
PA for Emgality started on covemymeds (key: [id number]). Pt has coverage through [facility] [phone]). PA requested for starter and maintenance dose. Decision pending.

## 2020-07-22 ENCOUNTER — Encounter: Payer: Self-pay | Admitting: *Deleted

## 2020-07-22 NOTE — Telephone Encounter (Signed)
Emgality approved Effective from 07/21/2020 through 10/12/2020. Sent her my chart to  inform.

## 2020-07-29 DIAGNOSIS — H353131 Nonexudative age-related macular degeneration, bilateral, early dry stage: Secondary | ICD-10-CM | POA: Diagnosis not present

## 2020-07-29 DIAGNOSIS — H18513 Endothelial corneal dystrophy, bilateral: Secondary | ICD-10-CM | POA: Diagnosis not present

## 2020-07-29 DIAGNOSIS — H3550 Unspecified hereditary retinal dystrophy: Secondary | ICD-10-CM | POA: Diagnosis not present

## 2020-07-29 DIAGNOSIS — H52223 Regular astigmatism, bilateral: Secondary | ICD-10-CM | POA: Diagnosis not present

## 2020-07-29 DIAGNOSIS — E119 Type 2 diabetes mellitus without complications: Secondary | ICD-10-CM | POA: Diagnosis not present

## 2020-07-29 LAB — HM DIABETES EYE EXAM

## 2020-08-07 ENCOUNTER — Other Ambulatory Visit: Payer: Self-pay | Admitting: Family Medicine

## 2020-08-12 ENCOUNTER — Telehealth: Payer: Self-pay

## 2020-08-12 ENCOUNTER — Other Ambulatory Visit (HOSPITAL_COMMUNITY)
Admission: RE | Admit: 2020-08-12 | Discharge: 2020-08-12 | Disposition: A | Discharge status: Home / Self Care | Payer: BC Managed Care – PPO | Source: Ambulatory Visit | Attending: Cardiology | Admitting: Cardiology

## 2020-08-12 DIAGNOSIS — Z79899 Other long term (current) drug therapy: Secondary | ICD-10-CM | POA: Diagnosis not present

## 2020-08-12 LAB — BASIC METABOLIC PANEL
Anion gap: 9 (ref 5–15)
BUN: 18 mg/dL (ref 8–23)
CO2: 25 mmol/L (ref 22–32)
Calcium: 9.6 mg/dL (ref 8.9–10.3)
Chloride: 108 mmol/L (ref 98–111)
Creatinine, Ser: 0.91 mg/dL (ref 0.44–1.00)
GFR, Estimated: >60 mL/min (ref >60)
Glucose, Bld: 119 mg/dL — ABNORMAL HIGH (ref 70–99)
Potassium: 4.1 mmol/L (ref 3.5–5.1)
Sodium: 142 mmol/L (ref 135–145)

## 2020-08-12 NOTE — Telephone Encounter (Signed)
Contacted patient who verbalized understanding. Patient had no questions or concerns at this time.

## 2020-08-12 NOTE — Telephone Encounter (Signed)
-----   Message from Satira Sark, MD sent at 08/12/2020  4:44 PM EDT ----- Renal function and potassium normal. Continue current therapy.

## 2020-08-13 ENCOUNTER — Encounter: Payer: Self-pay | Admitting: Student

## 2020-08-13 ENCOUNTER — Ambulatory Visit: Payer: BC Managed Care – PPO | Admitting: Student

## 2020-08-13 ENCOUNTER — Other Ambulatory Visit: Payer: Self-pay

## 2020-08-13 VITALS — BP 120/84 | HR 92 | Ht 62.0 in | Wt 278.0 lb

## 2020-08-13 DIAGNOSIS — I480 Paroxysmal atrial fibrillation: Secondary | ICD-10-CM

## 2020-08-13 DIAGNOSIS — Z8679 Personal history of other diseases of the circulatory system: Secondary | ICD-10-CM | POA: Diagnosis not present

## 2020-08-13 DIAGNOSIS — I10 Essential (primary) hypertension: Secondary | ICD-10-CM | POA: Diagnosis not present

## 2020-08-13 DIAGNOSIS — I422 Other hypertrophic cardiomyopathy: Secondary | ICD-10-CM

## 2020-08-13 NOTE — Patient Instructions (Signed)
Medication Instructions:  STOP Colchicine    *If you need a refill on your cardiac medications before your next appointment, please call your pharmacy*   Lab Work: None today   If you have labs (blood work) drawn today and your tests are completely normal, you will receive your results only by: Marland Kitchen MyChart Message (if you have MyChart) OR . A paper copy in the mail If you have any lab test that is abnormal or we need to change your treatment, we will call you to review the results.   Testing/Procedures: None today    Follow-Up: At [facility], you and your health needs are our priority.  As part of our continuing mission to provide you with exceptional heart care, we have created designated Provider Care Teams.  These Care Teams include your primary Cardiologist (physician) and Advanced Practice Providers (APPs -  Physician Assistants and Nurse Practitioners) who all work together to provide you with the care you need, when you need it.  We recommend signing up for the patient portal called "MyChart".  Sign up information is provided on this After Visit Summary.  MyChart is used to connect with patients for Virtual Visits (Telemedicine).  Patients are able to view lab/test results, encounter notes, upcoming appointments, etc.  Non-urgent messages can be sent to your provider as well.   To learn more about what you can do with MyChart, go to [email or web address].    Your next appointment:  09/22/20 at 3:30 pm with B.Strader, PA-C  Other Instructions We have left a message for [facility], [phone]  You should hear from them shortly.

## 2020-08-13 NOTE — Progress Notes (Signed)
Cardiology Office Note    Date:  08/13/2020   ID:  MERIDIAN SCHERGER, DOB 02/12/1956, MRN [id number]  PCP:  Monique Spar, MD  Cardiologist: Monique Lesches, MD    Chief Complaint  Patient presents with  . Follow-up    1 month visit    History of Present Illness:    Monique Gomez is a 65 y.o. female with past medical history of paroxysmal atrial fibrillation, acute viral myocarditis (occurring in 05/2020 with cath showing normal cors but MRI did show severe asymmetric LVH and concern for possible infiltrative cardiomyopathy), HTN, HLD,OSA (on CPAP), Type II DM and GERD who presents to the office today for 1 month follow-up.  She was examined by Dr. Domenic Gomez in 06/2020 reported overall feeling better as compared to the prior month but was still having shortness of breath and fatigue. Recent limited echocardiogram had shown a preserved EF with only trivial residual pericardial effusion. She was continued on Colchicine at that time for a likely 48-month course and Spironolactone 12.5 mg daily was added to her medication regimen for additional blood pressure control. She was referred to the [facility] for further evaluation of her abnormal cardiac MRI and concern for infiltrative process.  In talking with the patient today, her main symptoms continue to be fatigue and dyspnea on exertion. She reports walking from her car into the hospital yesterday for lab work and developed worsening dyspnea with associated shaking along her upper extremities. Denies any associated chest pain at that time.  She does experience some palpitations but reports symptoms have overall improved over the past few months. She does have an occasional shooting pain across her chest but this feels different from her prior symptoms in 05/2020. No recent orthopnea, PND or lower extremity edema. She reports tingling along her upper extremities which started about 2 weeks ago but reports being started on a  new medication for her migraines at that same time.   Past Medical History:  Diagnosis Date  . Abdominal fibromatosis   . ACS (acute coronary syndrome) ([place])   . Allergic rhinitis   . Anemia   . Asthma   . Essential hypertension   . GERD (gastroesophageal reflux disease)   . Headache(784.0)   . History of cardiac catheterization    No significant CAD May 2015  . History of migraine headaches   . Hyperlipidemia   . Migraines   . Myocarditis ([place])    a. diagnosed in 05/2020 with cath showing normal cors  . Obesity   . PAF (paroxysmal atrial fibrillation) ([place]) 08/2013  . Sleep apnea   . Type 2 diabetes mellitus ([place])     Past Surgical History:  Procedure Laterality Date  . CATARACT EXTRACTION    . CATARACT EXTRACTION W/PHACO Right 04/05/2019   Procedure: CATARACT EXTRACTION PHACO AND INTRAOCULAR LENS PLACEMENT RIGHT EYE (CDE: 3.19);  Surgeon: Monique Goldmann, MD;  Location: [facility];  Service: Ophthalmology;  Laterality: Right;  . COLONOSCOPY N/A 02/02/2015   SLF: 1. one colon polyp removed-no source for anemia identified. 2. moderate diverticulosis noted in the sigmoid colon and descending colon 3. the left colon is redundant 4. Rectal bleeding due ot small internal hemorroids 5. Moderate sized external hemorrhoids.  . ESOPHAGOGASTRODUODENOSCOPY N/A 02/02/2015   SLF: 1. 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 large hiatal hernia 3. mild non-erosive gastritis 4. No source for anemia identified.   Marland Kitchen LEFT HEART CATH AND CORONARY ANGIOGRAPHY N/A 05/14/2020   Procedure: LEFT  HEART CATH AND CORONARY ANGIOGRAPHY;  Surgeon: Monique Crome, MD;  Location: [facility] CV LAB;  Service: Cardiovascular;  Laterality: N/A;  . LEFT HEART CATHETERIZATION WITH CORONARY ANGIOGRAM N/A 09/03/2013   Procedure: LEFT HEART CATHETERIZATION WITH CORONARY ANGIOGRAM;  Surgeon: Monique Booze, MD;  Location: [facility] CATH LAB;  Service: Cardiovascular;  Laterality: N/A;  . Right carpal tunnel release       Current Medications: Outpatient Medications Prior to Visit  Medication Sig Dispense Refill  . acetaminophen (TYLENOL) 500 MG tablet Take 1 tablet (500 mg total) by mouth every 6 (six) hours as needed. 30 tablet 0  . apixaban (ELIQUIS) 5 MG TABS tablet Take 1 tablet (5 mg total) by mouth 2 (two) times daily. 60 tablet 3  . atorvastatin (LIPITOR) 80 MG tablet Take 1 tablet by mouth once daily 90 tablet 0  . cyclobenzaprine (FLEXERIL) 5 MG tablet Take 1 tablet (5 mg total) by mouth 3 (three) times daily as needed. 30 tablet 0  . fluticasone (FLONASE) 50 MCG/ACT nasal spray Place 2 sprays into both nostrils daily.    . Galcanezumab-gnlm (EMGALITY) 120 MG/ML SOAJ Inject 120 mg into the skin every 30 (thirty) days. 1.12 mL 11  . ipratropium (ATROVENT) 0.03 % nasal spray Place 2 sprays into both nostrils every 12 (twelve) hours. 30 mL 3  . Iron-FA-B Cmp-C-Biot-Probiotic (FUSION PLUS) CAPS Take 2 tablets by mouth daily. 60 capsule 3  . losartan (COZAAR) 25 MG tablet Take 1 tablet (25 mg total) by mouth in the morning and at bedtime. 60 tablet 6  . metFORMIN (GLUCOPHAGE) 500 MG tablet TAKE 1 TABLET BY MOUTH TWICE DAILY WITH A MEAL 180 tablet 0  . metoCLOPramide (REGLAN) 10 MG tablet Take 1 tablet (10 mg total) by mouth every 8 (eight) hours as needed for up to 7 days for nausea (Headache). 21 tablet 0  . metoprolol succinate (TOPROL-XL) 50 MG 24 hr tablet Take 1 tablet (50 mg total) by mouth in the morning and at bedtime. Take with or immediately following a meal. 180 tablet 3  . ondansetron (ZOFRAN ODT) 4 MG disintegrating tablet Take 1 tablet (4 mg total) by mouth every 8 (eight) hours as needed. 20 tablet 6  . pantoprazole (PROTONIX) 40 MG tablet Take 1 tablet by mouth twice daily 180 tablet 0  . spironolactone (ALDACTONE) 25 MG tablet Take 0.5 tablets (12.5 mg total) by mouth daily. 45 tablet 3  . topiramate (TOPAMAX) 50 MG tablet Take 1.5 tablets (75 mg total) by mouth at bedtime. 135 tablet 3   . Ubrogepant (UBRELVY) 50 MG TABS Take 1 tablet at the onset of a migraine.  Can repeat in 2 hours if needed. 15 tablet 5  . colchicine 0.6 MG tablet Take 1 tablet (0.6 mg total) by mouth daily. 90 tablet 0   No facility-administered medications prior to visit.     Allergies:   Venlafaxine   Social History   Socioeconomic History  . Marital status: Single    Spouse name: Not on file  . Number of children: 2  . Years of education: Not on file  . Highest education level: Not on file  Occupational History  . Occupation: Training and development officer: [employer]  Tobacco Use  . Smoking status: Former Smoker    Packs/day: 0.30    Types: Cigarettes    Quit date: 04/01/2012    Years since quitting: 8.3  . Smokeless tobacco: Never Used  Vaping Use  .  Vaping Use: Never used  Substance and Sexual Activity  . Alcohol use: Not Currently    Alcohol/week: 0.0 standard drinks    Comment: very occasional  . Drug use: No  . Sexual activity: Yes  Other Topics Concern  . Not on file  Social History Narrative  . Not on file   Social Determinants of Health   Financial Resource Strain: Not on file  Food Insecurity: Not on file  Transportation Needs: Not on file  Physical Activity: Not on file  Stress: Not on file  Social Connections: Not on file     Family History:  The patient's family history includes Alzheimer's disease in her father; Arthritis in her sister; Developmental delay in her son; Diabetes in her brother, mother, and son; Heart disease in her mother; High Cholesterol in her mother and sister; Hypertension in her brother, mother, sister, and sister.   Review of Systems:   Please see the history of present illness.     General:  No chills, fever, night sweats or weight changes. Positive for fatigue.  Cardiovascular:  No edema, orthopnea, paroxysmal nocturnal dyspnea. Positive for chest pain and dyspnea on exertion.  Dermatological: No rash, lesions/masses Respiratory:  No cough, dyspnea Urologic: No hematuria, dysuria Abdominal:   No nausea, vomiting, diarrhea, bright red blood per rectum, melena, or hematemesis Neurologic:  No visual changes, wkns, changes in mental status. All other systems reviewed and are otherwise negative except as noted above.   Physical Exam:    VS:  BP 120/84   Pulse 92   Ht 5\' 2"  (1.575 m)   Wt 278 lb (126.1 kg)   SpO2 96%   BMI 50.85 kg/m    General: Well developed, obese female appearing in no acute distress. Head: Normocephalic, atraumatic. Neck: No carotid bruits. JVD not elevated.  Lungs: Respirations regular and unlabored, without wheezes or rales.  Heart: Regular rate and rhythm. No S3 or S4.  No murmur, no rubs, or gallops appreciated. Abdomen: Appears non-distended. No obvious abdominal masses. Msk:  Strength and tone appear normal for age. No obvious joint deformities or effusions. Extremities: No clubbing or cyanosis. No lower extremity edema.  Distal pedal pulses are 2+ bilaterally. Neuro: Alert and oriented X 3. Moves all extremities spontaneously. No focal deficits noted. Psych:  Responds to questions appropriately with a normal affect. Skin: No rashes or lesions noted  Wt Readings from Last 3 Encounters:  08/13/20 278 lb (126.1 kg)  07/16/20 274 lb 6.4 oz (124.5 kg)  07/14/20 274 lb 1.9 oz (124.3 kg)     Studies/Labs Reviewed:   EKG:  EKG is not ordered today.   Recent Labs: 10/15/2019: Magnesium 1.8 05/13/2020: B Natriuretic Peptide 1,175.0; TSH 3.035 07/09/2020: ALT 39; Hemoglobin 12.8; Platelets 224 08/12/2020: BUN 18; Creatinine, Ser 0.91; Potassium 4.1; Sodium 142   Lipid Panel    Component Value Date/Time   CHOL 162 05/13/2020 1005   TRIG 156 (H) 05/13/2020 1005   HDL 24 (L) 05/13/2020 1005   CHOLHDL 6.8 05/13/2020 1005   VLDL 31 05/13/2020 1005   LDLCALC 107 (H) 05/13/2020 1005   LDLCALC 107 (H) 12/18/2019 4098    Additional studies/ records that were reviewed today include:    Cardiac Catheterization: 05/2020  Widely patent/normal widely patent coronary arteries.  Right dominant anatomy  Regional wall motion abnormality noted on left ventriculography. EF 40 to 50%.  RECOMMENDATIONS:   Given context of clinical presentation, myopericarditis is now the leading diagnosis in the differential. This presentation  is not atypical stress cardiomyopathy. Recommend MRI/cardiac.   Cardiac MRI: 05/2020 IMPRESSION: 1. Findings consistent with acute myocarditis including markedly elevated native T1 (up to 1440ms), T2 (up to 76 ms), and ECV (up to 64%). In addition there is extensive/diffuse late gadolinium enhancement.  2. While clearly has acute myocarditis, ECV is higher and LGE more extensive than typically seen in myocarditis. This could just be due to severe inflammation from myocarditis, or could have an underlying cardiomyopathy (does have severe asymmetric LVH that was present on prior echo 03/2019, and given ECV/LGE pattern, underlying amyloid is a consideration) with myocarditis in addition to this. If having ventricular arrhythmias, high degree AV block, or hemodynamic instability, would consider endomyocardial biopsy for further evaluation.  3. Normal LV size, severe asymmetric hypertrophy measuring up to 74mm in basal septum (36mm in posterior wall), and mild systolic dysfunction (EF 60%). Unfortunately basal most LV short axis slice was not included in the data set to calculate EF, which introduces a small amount of error in the EF calculation, though visually EF appears 40-45%  4. RV volumes were not able to be calculated as several basal short axis slices were missing from the data set. Visually RV appears normal size with mild systolic dysfunction (apical akinesis)  5. Moderate pericardial effusion, measures up to 30mm adjacent to LV inferior wall   Limited Echocardiogram: 06/2020 IMPRESSIONS    1. Limited study.  2. Left  ventricular ejection fraction, by estimation, is approximately  55%. The left ventricle has normal function. The left ventricle has no  regional wall motion abnormalities. There is severe asymmetric left  ventricular hypertrophy of the basal and  septal segments, otherwise moderate.  3. Right ventricular systolic function is normal. The right ventricular  size is normal.  4. There is a trivial pericardial effusion anterior to the right  ventricle. This has decreased in size in comparison to the prior study in  January 2022.  5. The mitral valve is grossly normal. Mild mitral valve regurgitation.  6. The inferior vena cava is normal in size with greater than 50%  respiratory variability, suggesting right atrial pressure of 3 mmHg.   Assessment:    1. History of viral myocarditis   2. Asymmetric septal hypertrophy ([facility])   3. Paroxysmal atrial fibrillation ([place])   4. Essential hypertension      Plan:   In order of problems listed above:  1. History of Viral Myocarditis - This occurred in 05/2020 and catheterization at that time showed normal cors with Cardiac MRI confirming myocarditis but was also noted to have severe asymmetric LVH and concern for possible infiltrative cardiomyopathy.  - She has been on Colchicine for 4 months, therefore will discontinue this as previously recommended at her last visit.   2. Concern for Infiltrative Cardiomyopathy - This was noted on her prior Cardiac MRI as outlined above. She was previously referred to the [facility] last month but has not heard back. Nursing staff did follow-up on this and a referral has been entered and approved but initial appointment has not yet been scheduled. We have called the clinic as well and left a message to check on the status of her appointment. I will also send a message to providers in the clinic to see if they have any initial recommendations for work-up (repeat Cardiac MRI, labs and PYP Scan  for Amyloid, etc.?) to see if this will help expedite her workup as she continues to have significant dyspnea and fatigue which  seem out of proportion to almost being 4 months out from myocarditis. She remains out of work at this time.   3. Paroxymal Atrial Fibrillation - She does experience occasional palpitations but symptoms have overall improved. In NSR by examination today. Continue Toprol-XL 50mg  BID for rate-control.  - No reports of active bleeding. Continue Eliquis 5mg  BID for anticoagulation.   4. HTN - BP is well-controlled at 120/84 during today's visit. Continue Losartan 25mg  BID, Toprol-XL 50mg  BID and Spironolactone 12.5mg  daily.    Medication Adjustments/Labs and Tests Ordered: Current medicines are reviewed at length with the patient today.  Concerns regarding medicines are outlined above.  Medication changes, Labs and Tests ordered today are listed in the Patient Instructions below. Patient Instructions  Medication Instructions:  STOP Colchicine    *If you need a refill on your cardiac medications before your next appointment, please call your pharmacy*   Lab Work: None today   If you have labs (blood work) drawn today and your tests are completely normal, you will receive your results only by: Marland Kitchen MyChart Message (if you have MyChart) OR . A paper copy in the mail If you have any lab test that is abnormal or we need to change your treatment, we will call you to review the results.   Testing/Procedures: None today    Follow-Up: At [facility], you and your health needs are our priority.  As part of our continuing mission to provide you with exceptional heart care, we have created designated Provider Care Teams.  These Care Teams include your primary Cardiologist (physician) and Advanced Practice Providers (APPs -  Physician Assistants and Nurse Practitioners) who all work together to provide you with the care you need, when you need it.  We recommend signing  up for the patient portal called "MyChart".  Sign up information is provided on this After Visit Summary.  MyChart is used to connect with patients for Virtual Visits (Telemedicine).  Patients are able to view lab/test results, encounter notes, upcoming appointments, etc.  Non-urgent messages can be sent to your provider as well.   To learn more about what you can do with MyChart, go to [email or web address].    Your next appointment:  09/22/20 at 3:30 pm with B.Raaga Maeder, PA-C  Other Instructions We have left a message for [facility], [phone]  You should hear from them shortly.    Signed, Erma Heritage, PA-C  08/13/2020 7:59 PM    [facility] [address] [place], [place] [address] Phone: [phone] Fax: [phone]

## 2020-08-17 DIAGNOSIS — J310 Chronic rhinitis: Secondary | ICD-10-CM | POA: Diagnosis not present

## 2020-08-17 DIAGNOSIS — J343 Hypertrophy of nasal turbinates: Secondary | ICD-10-CM | POA: Diagnosis not present

## 2020-08-19 ENCOUNTER — Other Ambulatory Visit: Payer: Self-pay | Admitting: Family Medicine

## 2020-08-20 ENCOUNTER — Telehealth: Payer: Self-pay | Admitting: Student

## 2020-08-20 NOTE — Telephone Encounter (Signed)
Received call back from [place] at the [employer].  She requested for you to call the following numbers # [phone] or her personal cell # [phone].

## 2020-08-20 NOTE — Telephone Encounter (Signed)
Received telephone call from the [place] regarding some paper work on Mrs. Aultman.  She was requesting to speak with Kisha. [phone] .

## 2020-08-21 ENCOUNTER — Other Ambulatory Visit: Payer: Self-pay | Admitting: Family Medicine

## 2020-08-21 ENCOUNTER — Telehealth (HOSPITAL_COMMUNITY): Payer: Self-pay | Admitting: Vascular Surgery

## 2020-08-21 ENCOUNTER — Telehealth: Payer: Self-pay | Admitting: Cardiology

## 2020-08-21 DIAGNOSIS — Z0279 Encounter for issue of other medical certificate: Secondary | ICD-10-CM

## 2020-08-21 NOTE — Telephone Encounter (Signed)
Left pt message giving ASAP APPT w/ Dr. Haroldine Laws 4/29 @ 11:20 AM, Asked pt to call back to confirm appt

## 2020-08-21 NOTE — Telephone Encounter (Signed)
Forms from [employer] received on 08/21/20 Completed patient auth attached. Took form to MD Box for completion. [facility] 08/21/20

## 2020-08-21 NOTE — Telephone Encounter (Signed)
Spoke with Monique Gomez who states that Monique Gomez has forms to be completed. Monique Gomez states that forms were given in March to be completed. She is requesting that forms be completed and faxed or sent back ASAP. She has to fill Monique Gomez position but Monique Gomez can apply for long term disability.

## 2020-08-24 ENCOUNTER — Other Ambulatory Visit: Payer: Self-pay | Admitting: *Deleted

## 2020-08-24 ENCOUNTER — Other Ambulatory Visit: Payer: Self-pay | Admitting: Family Medicine

## 2020-08-24 ENCOUNTER — Encounter: Payer: Self-pay | Admitting: Internal Medicine

## 2020-08-24 MED ORDER — METFORMIN HCL 500 MG PO TABS: 1.0000 | ORAL_TABLET | Freq: Two times a day (BID) | ORAL | 0 refills | Status: DC

## 2020-08-25 ENCOUNTER — Encounter: Payer: Self-pay | Admitting: *Deleted

## 2020-08-25 NOTE — Telephone Encounter (Signed)
Form completed 08/25/20 patient to pay and pick up either today or tomorrow

## 2020-08-26 NOTE — Telephone Encounter (Signed)
Patient paid /and forms picked up

## 2020-08-28 ENCOUNTER — Encounter (HOSPITAL_COMMUNITY): Payer: Self-pay | Admitting: Internal Medicine

## 2020-08-28 ENCOUNTER — Ambulatory Visit (HOSPITAL_COMMUNITY)
Admission: RE | Admit: 2020-08-28 | Discharge: 2020-08-28 | Disposition: A | Discharge status: Home / Self Care | Payer: BC Managed Care – PPO | Source: Ambulatory Visit | Attending: Internal Medicine | Admitting: Internal Medicine

## 2020-08-28 ENCOUNTER — Encounter (HOSPITAL_COMMUNITY): Payer: Self-pay | Admitting: Cardiology

## 2020-08-28 ENCOUNTER — Other Ambulatory Visit: Payer: Self-pay

## 2020-08-28 VITALS — BP 126/98 | HR 81 | Ht 62.0 in | Wt 282.0 lb

## 2020-08-28 DIAGNOSIS — I4510 Unspecified right bundle-branch block: Secondary | ICD-10-CM | POA: Insufficient documentation

## 2020-08-28 DIAGNOSIS — I5022 Chronic systolic (congestive) heart failure: Secondary | ICD-10-CM

## 2020-08-28 DIAGNOSIS — I491 Atrial premature depolarization: Secondary | ICD-10-CM | POA: Insufficient documentation

## 2020-08-28 DIAGNOSIS — I400 Infective myocarditis: Secondary | ICD-10-CM

## 2020-08-28 DIAGNOSIS — I10 Essential (primary) hypertension: Secondary | ICD-10-CM

## 2020-08-28 DIAGNOSIS — I409 Acute myocarditis, unspecified: Secondary | ICD-10-CM | POA: Diagnosis not present

## 2020-08-28 LAB — BASIC METABOLIC PANEL
Anion gap: 8 (ref 5–15)
BUN: 17 mg/dL (ref 8–23)
CO2: 28 mmol/L (ref 22–32)
Calcium: 9.7 mg/dL (ref 8.9–10.3)
Chloride: 104 mmol/L (ref 98–111)
Creatinine, Ser: 0.84 mg/dL (ref 0.44–1.00)
GFR, Estimated: >60 mL/min (ref >60)
Glucose, Bld: 109 mg/dL — ABNORMAL HIGH (ref 70–99)
Potassium: 4.3 mmol/L (ref 3.5–5.1)
Sodium: 140 mmol/L (ref 135–145)

## 2020-08-28 LAB — BRAIN NATRIURETIC PEPTIDE: B Natriuretic Peptide: 100.9 pg/mL — ABNORMAL HIGH (ref 0.0–100.0)

## 2020-08-28 LAB — CBC
HCT: 39.8 % (ref 36.0–46.0)
Hemoglobin: 12 g/dL (ref 12.0–15.0)
MCH: 22.2 pg — ABNORMAL LOW (ref 26.0–34.0)
MCHC: 30.2 g/dL (ref 30.0–36.0)
MCV: 73.6 fL — ABNORMAL LOW (ref 80.0–100.0)
Platelets: 309 10*3/uL (ref 150–400)
RBC: 5.41 MIL/uL — ABNORMAL HIGH (ref 3.87–5.11)
RDW: 17.5 % — ABNORMAL HIGH (ref 11.5–15.5)
WBC: 9.2 10*3/uL (ref 4.0–10.5)
nRBC: 0 % (ref 0.0–0.2)

## 2020-08-28 MED ORDER — ENTRESTO 49-51 MG PO TABS: 1.0000 | ORAL_TABLET | Freq: Two times a day (BID) | ORAL | 6 refills | Status: DC

## 2020-08-28 NOTE — Progress Notes (Signed)
ReDS Vest / Clip - 08/28/20 1136      ReDS Vest / Clip   Station Marker B    Ruler Value 35    ReDS Value Range Moderate volume overload    ReDS Actual Value 39

## 2020-08-28 NOTE — Progress Notes (Signed)
ADVANCED HF CLINIC CONSULT NOTE  Referring Physician: Rozann Lesches, MD Primary Care: Lindell Spar, MD Primary Cardiologist: Rozann Lesches, MD   HPI:  Monique Gomez is a morbidly obese 65 y.o. female with PAF, viral myocarditis (occurring in 05/2020 with cath showing normal cors but MRI did show severe asymmetric LVH and concern for possible infiltrative cardiomyopathy),HTN, HLD,OSA (on CPAP) and DM2 referred by Dr. Domenic Polite for further evaluation of her cardiomyopathy.  Says several years ago BP was very high when she had severe migraine HAs. 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  Developed viral illness in 1/22. Felt she had COVID but tested negative (was vaccinated). Then developed progressive SOB, diarrhea, n/v, chest pain and SOB.   Admitted to [facility] in 05/2020 for evaluation of an NSTEMI with elevated troponin values peaking at greater than 27,000 Given her enzyme elevation and EKG abnormalities (inferolateral ST elevation), she underwent a cardiac catheterization on 05/14/2020 which showed normal coronary arteries.  Cardiac MRI was consistent with acute myocarditis but also raised the potential of HCM +/- infiltrative process. Viral panel was positive for RSV.   cMRI 05/15/20 1 Findings consistent with acute myocarditis including markedly elevated native T1 (up to 1424ms), T2 (up to 76 ms), and ECV (up to 64%). In addition there is extensive/diffuse late gadolinium enhancement.While clearly has acute myocarditis, ECV is higher and LGE more extensive than typically seen in myocarditis. This could just be due to severe inflammation from myocarditis, or could have an underlying infiltrative  cardiomyopathy  2. Normal LV size, severe asymmetric hypertrophy measuring up to 61mm in basal septum (70mm in posterior wall), and mild systolic dysfunction (EF 25%).  3. RV with mild systolic dysfunction (apical akinesis) 4.  Moderate pericardial effusion  Echo 2/22: LVEF 55% Severe asymmetric left  ventricular hypertrophy of the basal and  septal segments, otherwise moderate. Trivial pericardial effusion   Says she is feeling a little better but still very fatigued and SOB with minimal activities. No edema, orthopnea or PND.    Mother had CAD and had a stent. Died at 8 from Alzheimer's Dad died at 19 from Alzheimer's No hart disease 2S and 1 B  1B died from DM2 complications No heart issues 2S no heart issues No relatives with weak hearts, thick hearts or SCD   Review of Systems: [y] = yes, [ ]  = no   General: Weight gain [ ] ; Weight loss [ ] ; Anorexia [ ] ; Fatigue [name] ]; Fever [ ] ; Chills [ ] ; Weakness [ ]   Cardiac: Chest pain/pressure [name] ]; Resting SOB [ y]; Exertional SOB [name] ]; Orthopnea [ ] ; Pedal Edema [ ] ; Palpitations [ ] ; Syncope [ ] ; Presyncope [ ] ; Paroxysmal nocturnal dyspnea[ ]   Pulmonary: Cough [ ] ; Wheezing[ ] ; Hemoptysis[ ] ; Sputum [ ] ; Snoring [ y]  GI: Vomiting[ ] ; Dysphagia[ ] ; Melena[ ] ; Hematochezia [ ] ; Heartburn[ ] ; Abdominal pain [ ] ; Constipation [ ] ; Diarrhea [ ] ; BRBPR [ ]   GU: Hematuria[ ] ; Dysuria [ ] ; Nocturia[ ]   Vascular: Pain in legs with walking [ ] ; Pain in feet with lying flat [ ] ; Non-healing sores [ ] ; Stroke [ ] ; TIA [ ] ; Slurred speech [ ] ;  Neuro: Headaches[ ] ; Vertigo[ ] ; Seizures[ ] ; Paresthesias[ ] ;Blurred vision [ ] ; Diplopia [ ] ; Vision changes [ ]   Ortho/Skin: Arthritis [ y]; Joint pain [name] ]; Muscle pain [ ] ; Joint swelling [ ] ; Back  Pain [ ] ; Rash [ ]   Psych: Depression[ ] ; Anxiety[ ]   Heme: Bleeding problems [ ] ; Clotting disorders [ ] ; Anemia [ ]   Endocrine: Diabetes [ y]; Thyroid dysfunction[ ]    Past Medical History:  Diagnosis Date  . Abdominal fibromatosis   . ACS (acute coronary syndrome) ([place])   . Allergic rhinitis   . Anemia   . Asthma   . Essential hypertension   . GERD (gastroesophageal reflux disease)   . Headache(784.0)   . History of  cardiac catheterization    No significant CAD May 2015  . History of migraine headaches   . Hyperlipidemia   . Migraines   . Myocarditis ([place])    a. diagnosed in 05/2020 with cath showing normal cors  . Obesity   . PAF (paroxysmal atrial fibrillation) ([place]) 08/2013  . Sleep apnea   . Type 2 diabetes mellitus ([place])     Current Outpatient Medications  Medication Sig Dispense Refill  . acetaminophen (TYLENOL) 500 MG tablet Take 1 tablet (500 mg total) by mouth every 6 (six) hours as needed. 30 tablet 0  . apixaban (ELIQUIS) 5 MG TABS tablet Take 1 tablet (5 mg total) by mouth 2 (two) times daily. 60 tablet 3  . atorvastatin (LIPITOR) 80 MG tablet Take 1 tablet by mouth once daily 90 tablet 0  . cyclobenzaprine (FLEXERIL) 5 MG tablet Take 1 tablet (5 mg total) by mouth 3 (three) times daily as needed. 30 tablet 0  . fluticasone (FLONASE) 50 MCG/ACT nasal spray Place 2 sprays into both nostrils daily.    . Galcanezumab-gnlm (EMGALITY) 120 MG/ML SOAJ Inject 120 mg into the skin every 30 (thirty) days. 1.12 mL 11  . ipratropium (ATROVENT) 0.03 % nasal spray Place 2 sprays into both nostrils every 12 (twelve) hours. 30 mL 3  . Iron-FA-B Cmp-C-Biot-Probiotic (FUSION PLUS) CAPS Take 2 tablets by mouth daily. 60 capsule 3  . losartan (COZAAR) 25 MG tablet Take 1 tablet (25 mg total) by mouth in the morning and at bedtime. 60 tablet 6  . metFORMIN (GLUCOPHAGE) 500 MG tablet Take 1 tablet (500 mg total) by mouth 2 (two) times daily with a meal. 180 tablet 0  . metoCLOPramide (REGLAN) 10 MG tablet Take 1 tablet (10 mg total) by mouth every 8 (eight) hours as needed for up to 7 days for nausea (Headache). 21 tablet 0  . metoprolol succinate (TOPROL-XL) 50 MG 24 hr tablet Take 1 tablet (50 mg total) by mouth in the morning and at bedtime. Take with or immediately following a meal. 180 tablet 3  . ondansetron (ZOFRAN ODT) 4 MG disintegrating tablet Take 1 tablet (4 mg total) by mouth every 8 (eight) hours  as needed. 20 tablet 6  . pantoprazole (PROTONIX) 40 MG tablet Take 1 tablet by mouth twice daily 180 tablet 0  . spironolactone (ALDACTONE) 25 MG tablet Take 0.5 tablets (12.5 mg total) by mouth daily. 45 tablet 3  . topiramate (TOPAMAX) 50 MG tablet Take 1.5 tablets (75 mg total) by mouth at bedtime. 135 tablet 3  . Ubrogepant (UBRELVY) 50 MG TABS Take 1 tablet at the onset of a migraine.  Can repeat in 2 hours if needed. 15 tablet 5   No current facility-administered medications for this encounter.    Allergies  Allergen Reactions  . Venlafaxine Nausea And Vomiting and Other (See Comments)    Dizziness, shakiness      Social History   Socioeconomic History  . Marital status: Single  Spouse name: Not on file  . Number of children: 2  . Years of education: Not on file  . Highest education level: Not on file  Occupational History  . Occupation: Training and development officer: [employer]  Tobacco Use  . Smoking status: Former Smoker    Packs/day: 0.30    Types: Cigarettes    Quit date: 04/01/2012    Years since quitting: 8.4  . Smokeless tobacco: Never Used  Vaping Use  . Vaping Use: Never used  Substance and Sexual Activity  . Alcohol use: Not Currently    Alcohol/week: 0.0 standard drinks    Comment: very occasional  . Drug use: No  . Sexual activity: Yes  Other Topics Concern  . Not on file  Social History Narrative  . Not on file   Social Determinants of Health   Financial Resource Strain: Not on file  Food Insecurity: Not on file  Transportation Needs: Not on file  Physical Activity: Not on file  Stress: Not on file  Social Connections: Not on file  Intimate Partner Violence: Not on file      Family History  Problem Relation Age of Onset  . Diabetes Mother   . Hypertension Mother   . Heart disease Mother   . High Cholesterol Mother   . Alzheimer's disease Father   . Hypertension Sister   . Hypertension Brother   . Diabetes Brother   .  Arthritis Sister   . Hypertension Sister   . High Cholesterol Sister   . Diabetes Son   . Developmental delay Son   . Colon cancer Neg Hx     Vitals:   08/28/20 1136  BP: (!) 126/98  Pulse: 81  SpO2: 98%  Weight: 127.9 kg (282 lb)  Height: 5\' 2"  (1.575 m)    PHYSICAL EXAM: General:  Obese woman Well appearing. No respiratory difficulty HEENT: normal Neck: supple. no JVD. Carotids 2+ bilat; no bruits. No lymphadenopathy or thryomegaly appreciated. Cor: PMI nondisplaced. Regular rate & rhythm. No rubs, gallops or murmurs. Lungs: clear Abdomen: soft, nontender, nondistended. No hepatosplenomegaly. No bruits or masses. Good bowel sounds. Extremities: no cyanosis, clubbing, rash, edema Neuro: alert & oriented x 3, cranial nerves grossly intact. moves all 4 extremities w/o difficulty. Affect pleasant.  ECG: NSR 78 RBBB Personally reviewed   ASSESSMENT & PLAN:  1. Viral Myocarditis with concern for possible underlying infiltrative CM - This occurred in 05/2020 and catheterization at that time showed normal cors with Cardiac MRI confirming severe  myocarditis but was also noted to have severe asymmetric LVH and concern for possible infiltrative cardiomyopathy with very high ECV.  - Now off colchicine - EF normalized on echo 2/22 - Will repeat cMRI in 1-2 months to reassess infiltrative pattern. May need w/u for TTR as well depending on results of f/u MRI (see below)  2. Chronic systolic HF with recovered EF - Due to acute myocarditis in 1/22. EF 41% - Echo 2/22 EF 55% - NYHA III - Volume status mildly elevated. REDS 39% - Switch losartan 25 to Entresto 49/51 bid - Continue spiro 12.5 - Continue Toprol 50 bid - Add Jardiance soon  3. 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 Infiltrative CM also a possibility - No evidence of SAM or LVOT obstruction on echo  - refer to Dr. Broadus John for genetic w/u   - repeat MRI  as above. May need PYP. Check  myeloma panel  - Once further out from myocarditis will need CPX testing and Zio monior to risk stratify need for ICD - Continue Toprol   4. Paroxymal Atrial Fibrillation - She does experience occasional palpitations but symptoms have overall improved. In NSR by examination today. Continue Toprol-XL 50mg  BID for rate-control.  - No reports of active bleeding. Continue Eliquis 5mg  BID for anticoagulation.   5. HTN - this has been longstanding - BP now improved  6. Morbid obesity Body mass index is 51.58 kg/m.   Glori Bickers, MD  11:59 AM

## 2020-08-28 NOTE — Patient Instructions (Signed)
STOP Losartan START Entresto 49/51 mg, one tab twice a day  Labs today We will only contact you if something comes back abnormal or we need to make some changes. Otherwise no news is good news!  You have been referred to Cardiac Rehab -they will be in contact to arrange orientation  You have been referred to [facility] Dr. [name] -they will be in touch with an appointment  Your physician has requested that you have a cardiac MRI. Cardiac MRI uses a computer to create images of your heart as its beating, producing both still and moving pictures of your heart and major blood vessels. For further information please visit [email or web address] Please follow the instruction sheet given to you today for more information. -Once approved with insurance, schedulers will be in contact   Your physician recommends that you schedule a follow-up appointment in: 6-8 weeks with Dr Haroldine Laws   At the [facility], you and your health needs are our priority. As part of our continuing mission to provide you with exceptional heart care, we have created designated Provider Care Teams. These Care Teams include your primary Cardiologist (physician) and Advanced Practice Providers (APPs- Physician Assistants and Nurse Practitioners) who all work together to provide you with the care you need, when you need it.   You may see any of the following providers on your designated Care Team at your next follow up: Marland Kitchen Dr Glori Bickers . Dr Loralie Champagne . Dr Vickki Muff . Darrick Grinder, NP . Lyda Jester, [place] . Audry Riles, PharmD   Please be sure to bring in all your medications bottles to every appointment.   If you have any questions or concerns before your next appointment please send [place] a message through [place] or call our office at [phone].    TO LEAVE A MESSAGE FOR THE NURSE SELECT OPTION 2, PLEASE LEAVE A MESSAGE INCLUDING: . YOUR NAME . DATE OF  BIRTH . CALL BACK NUMBER . REASON FOR CALL**this is important as we prioritize the call backs  YOU WILL RECEIVE A CALL BACK THE SAME DAY AS LONG AS YOU CALL BEFORE 4:00 PM

## 2020-09-01 LAB — MULTIPLE MYELOMA PANEL, SERUM
Albumin SerPl Elph-Mcnc: 3.3 g/dL (ref 2.9–4.4)
Albumin/Glob SerPl: 1 (ref 0.7–1.7)
Alpha 1: 0.3 g/dL (ref 0.0–0.4)
Alpha2 Glob SerPl Elph-Mcnc: 0.8 g/dL (ref 0.4–1.0)
B-Globulin SerPl Elph-Mcnc: 1.2 g/dL (ref 0.7–1.3)
Gamma Glob SerPl Elph-Mcnc: 1.1 g/dL (ref 0.4–1.8)
Globulin, Total: 3.5 g/dL (ref 2.2–3.9)
IgA: 229 mg/dL (ref 87–352)
IgG (Immunoglobin G), Serum: 1285 mg/dL (ref 586–1602)
IgM (Immunoglobulin M), Srm: 110 mg/dL (ref 26–217)
Total Protein ELP: 6.8 g/dL (ref 6.0–8.5)

## 2020-09-07 ENCOUNTER — Other Ambulatory Visit: Payer: Self-pay | Admitting: Family Medicine

## 2020-09-07 DIAGNOSIS — K219 Gastro-esophageal reflux disease without esophagitis: Secondary | ICD-10-CM

## 2020-09-09 ENCOUNTER — Other Ambulatory Visit: Payer: Self-pay | Admitting: Family Medicine

## 2020-09-09 DIAGNOSIS — K219 Gastro-esophageal reflux disease without esophagitis: Secondary | ICD-10-CM

## 2020-09-10 ENCOUNTER — Other Ambulatory Visit: Payer: Self-pay | Admitting: *Deleted

## 2020-09-10 DIAGNOSIS — K219 Gastro-esophageal reflux disease without esophagitis: Secondary | ICD-10-CM

## 2020-09-10 MED ORDER — ATORVASTATIN CALCIUM 80 MG PO TABS: 1.0000 | ORAL_TABLET | Freq: Every day | ORAL | 0 refills | Status: DC

## 2020-09-10 MED ORDER — PANTOPRAZOLE SODIUM 40 MG PO TBEC: 1.0000 | DELAYED_RELEASE_TABLET | Freq: Two times a day (BID) | ORAL | 0 refills | Status: DC

## 2020-09-14 NOTE — Addendum Note (Signed)
Encounter addended by: Jolaine Artist, MD on: 09/14/2020 9:11 AM  Actions taken: Level of Service modified, Visit diagnoses modified, Clinical Note Signed

## 2020-09-22 ENCOUNTER — Ambulatory Visit: Payer: BC Managed Care – PPO | Admitting: Student

## 2020-09-22 ENCOUNTER — Ambulatory Visit (INDEPENDENT_AMBULATORY_CARE_PROVIDER_SITE_OTHER): Payer: BC Managed Care – PPO

## 2020-09-22 ENCOUNTER — Other Ambulatory Visit: Payer: Self-pay | Admitting: Student

## 2020-09-22 ENCOUNTER — Encounter: Payer: Self-pay | Admitting: *Deleted

## 2020-09-22 ENCOUNTER — Encounter: Payer: Self-pay | Admitting: Student

## 2020-09-22 ENCOUNTER — Other Ambulatory Visit: Payer: Self-pay

## 2020-09-22 VITALS — BP 130/84 | HR 105 | Ht 62.0 in | Wt 280.0 lb

## 2020-09-22 DIAGNOSIS — I10 Essential (primary) hypertension: Secondary | ICD-10-CM

## 2020-09-22 DIAGNOSIS — R002 Palpitations: Secondary | ICD-10-CM

## 2020-09-22 DIAGNOSIS — I400 Infective myocarditis: Secondary | ICD-10-CM | POA: Diagnosis not present

## 2020-09-22 DIAGNOSIS — I480 Paroxysmal atrial fibrillation: Secondary | ICD-10-CM

## 2020-09-22 DIAGNOSIS — Z8679 Personal history of other diseases of the circulatory system: Secondary | ICD-10-CM | POA: Diagnosis not present

## 2020-09-22 DIAGNOSIS — Z79899 Other long term (current) drug therapy: Secondary | ICD-10-CM

## 2020-09-22 HISTORY — DX: Infective myocarditis: I40.0

## 2020-09-22 MED ORDER — ENTRESTO 49-51 MG PO TABS: 1.0000 | ORAL_TABLET | Freq: Two times a day (BID) | ORAL | 3 refills | Status: DC

## 2020-09-22 NOTE — Patient Instructions (Signed)
Medication Instructions:  Your physician recommends that you continue on your current medications as directed. Please refer to the Current Medication list given to you today.  *If you need a refill on your cardiac medications before your next appointment, please call your pharmacy*   Lab Work: Your physician recommends that you return for lab work in: Tomorrow   If you have labs (blood work) drawn today and your tests are completely normal, you will receive your results only by: Marland Kitchen MyChart Message (if you have MyChart) OR . A paper copy in the mail If you have any lab test that is abnormal or we need to change your treatment, we will call you to review the results.   Testing/Procedures: [place] [place]- Long Term Monitor Instructions   Your physician has requested you wear your ZIO patch monitor___14___days.   This is a single patch monitor.  [employer] supplies one patch monitor per enrollment.  Additional stickers are not available.   Please do not apply patch if you will be having a Nuclear Stress Test, Echocardiogram, Cardiac CT, MRI, or Chest Xray during the time frame you would be wearing the monitor. The patch cannot be worn during these tests.  You cannot remove and re-apply the ZIO XT patch monitor.   Your ZIO patch monitor will be sent USPS Priority mail from [facility] directly to your home address. The monitor may also be mailed to a PO BOX if home delivery is not available.   It may take 3-5 days to receive your monitor after you have been enrolled.   Once you have received you monitor, please review enclosed instructions.  Your monitor has already been registered assigning a specific monitor serial # to you.   Applying the monitor   Shave hair from upper left chest.   Hold abrader disc by orange tab.  Rub abrader in 40 strokes over left upper chest as indicated in your monitor instructions.   Clean area with 4 enclosed alcohol pads .  Use all pads to assure are is cleaned  thoroughly.  Let dry.   Apply patch as indicated in monitor instructions.  Patch will be place under collarbone on left side of chest with arrow pointing upward.   Rub patch adhesive wings for 2 minutes.Remove white label marked "1".  Remove white label marked "2".  Rub patch adhesive wings for 2 additional minutes.   While looking in a mirror, press and release button in center of patch.  A small green light will flash 3-4 times .  This will be your only indicator the monitor has been turned on.     Do not shower for the first 24 hours.  You may shower after the first 24 hours.   Press button if you feel a symptom. You will hear a small click.  Record Date, Time and Symptom in the Patient Log Book.   When you are ready to remove patch, follow instructions on last 2 pages of Patient Log Book.  Stick patch monitor onto last page of Patient Log Book.   Place Patient Log Book in [place] box.  Use locking tab on box and tape box closed securely.  The [employer] and [employer] has [employer] on it.  Please place in mailbox as soon as possible.  Your physician should have your test results approximately 7 days after the monitor has been mailed back to [facility].   Call [place] at [phone] if you have questions regarding your ZIO  XT patch monitor.  Call them immediately if you see an orange light blinking on your monitor.   If your monitor falls off in less than 4 days contact our Monitor department at [phone].  If your monitor becomes loose or falls off after 4 days call [employer] at [phone] for suggestions on securing your monitor.   Follow-Up: At [facility], you and your health needs are our priority.  As part of our continuing mission to provide you with exceptional heart care, we have created designated Provider Care Teams.  These Care Teams include your primary Cardiologist (physician) and Advanced Practice Providers (APPs -  Physician Assistants and  Nurse Practitioners) who all work together to provide you with the care you need, when you need it.  We recommend signing up for the patient portal called "MyChart".  Sign up information is provided on this After Visit Summary.  MyChart is used to connect with patients for Virtual Visits (Telemedicine).  Patients are able to view lab/test results, encounter notes, upcoming appointments, etc.  Non-urgent messages can be sent to your provider as well.   To learn more about what you can do with MyChart, go to [email or web address].    Your next appointment:   2 month(s)  The format for your next appointment:   In Person  Provider:   Bernerd Pho, PA-C   Other Instructions Thank you for choosing [facility]!

## 2020-09-22 NOTE — Progress Notes (Signed)
Cardiology Office Note    Date:  09/22/2020   ID:  Monique, Gomez 1956-02-24, MRN [id number]  PCP:  Lindell Spar, MD  Cardiologist: Rozann Lesches, MD   AHF: Dr. Haroldine Laws  Chief Complaint  Patient presents with  . Follow-up    1 month visit    History of Present Illness:    Monique Gomez is a 65 y.o. female with past medical history of paroxysmal atrial fibrillation,acute viral myocarditis (occurring in 05/2020 with cath showing normal cors but MRI did show severe asymmetric LVH and concern for possible infiltrative cardiomyopathy),HTN, HLD,OSA (on CPAP),Type II DM and GERD who presents for 36-month follow-up.   She was last examined by myself in 07/2020 and reported still having significant fatigue and dyspnea on exertion but denied any associated exertional chest pain.  Colchicine was discontinued as she had completed a 53-month course following her recent viral myocarditis. She had previously been referred to the Advanced Heart Failure clinic and did follow-up with Dr. Haroldine Laws on 08/28/2020 and it was recommended she had a repeat cMRI in 1-2 months for reassessment and consider workup for TTR pending cMRI results. Losartan was switched to Entresto 49-51mg  BID and she was continued on Spironolactone and Toprol-XL.   In talking with the patient today, she reports still having very significant fatigue and dyspnea on exertion. She reports trying to go out to eat dinner with family for her birthday and developed significant fatigue and had to go home early. She says that she sometimes has good days but at other times she is unable to walk from her bedroom to the bathroom without having significant symptoms. Reports associated palpitations at times along with episodes of shaking. No specific exertional chest pain. No reported orthopnea, PND or pitting edema. She did not notice any change in symptoms following the initiation of Entresto but has experienced more frequent  urination.   Past Medical History:  Diagnosis Date  . Abdominal fibromatosis   . ACS (acute coronary syndrome) ([place])   . Allergic rhinitis   . Anemia   . Asthma   . Essential hypertension   . GERD (gastroesophageal reflux disease)   . Headache(784.0)   . History of cardiac catheterization    No significant CAD May 2015  . History of migraine headaches   . Hyperlipidemia   . Migraines   . Myocarditis ([place])    a. diagnosed in 05/2020 with cath showing normal cors  . Obesity   . PAF (paroxysmal atrial fibrillation) ([place]) 08/2013  . Sleep apnea   . Type 2 diabetes mellitus ([place])     Past Surgical History:  Procedure Laterality Date  . CATARACT EXTRACTION    . CATARACT EXTRACTION W/PHACO Right 04/05/2019   Procedure: CATARACT EXTRACTION PHACO AND INTRAOCULAR LENS PLACEMENT RIGHT EYE (CDE: 3.19);  Surgeon: Baruch Goldmann, MD;  Location: [facility];  Service: Ophthalmology;  Laterality: Right;  . COLONOSCOPY N/A 02/02/2015   SLF: 1. one colon polyp removed-no source for anemia identified. 2. moderate diverticulosis noted in the sigmoid colon and descending colon 3. the left colon is redundant 4. Rectal bleeding due ot small internal hemorroids 5. Moderate sized external hemorrhoids.  . ESOPHAGOGASTRODUODENOSCOPY N/A 02/02/2015   SLF: 1. 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 large hiatal hernia 3. mild non-erosive gastritis 4. No source for anemia identified.   Marland Kitchen LEFT HEART CATH AND CORONARY ANGIOGRAPHY N/A 05/14/2020   Procedure: LEFT HEART CATH AND CORONARY ANGIOGRAPHY;  Surgeon: Belva Crome, MD;  Location: [facility];  Service: Cardiovascular;  Laterality: N/A;  . LEFT HEART CATHETERIZATION WITH CORONARY ANGIOGRAM N/A 09/03/2013   Procedure: LEFT HEART CATHETERIZATION WITH CORONARY ANGIOGRAM;  Surgeon: Jettie Booze, MD;  Location: [facility] CATH LAB;  Service: Cardiovascular;  Laterality: N/A;  . Right carpal tunnel release      Current Medications: Outpatient  Medications Prior to Visit  Medication Sig Dispense Refill  . acetaminophen (TYLENOL) 500 MG tablet Take 1 tablet (500 mg total) by mouth every 6 (six) hours as needed. 30 tablet 0  . apixaban (ELIQUIS) 5 MG TABS tablet Take 1 tablet (5 mg total) by mouth 2 (two) times daily. 60 tablet 3  . atorvastatin (LIPITOR) 80 MG tablet Take 1 tablet (80 mg total) by mouth daily. 90 tablet 0  . fluticasone (FLONASE) 50 MCG/ACT nasal spray Place 2 sprays into both nostrils daily.    . Galcanezumab-gnlm (EMGALITY) 120 MG/ML SOAJ Inject 120 mg into the skin every 30 (thirty) days. 1.12 mL 11  . ipratropium (ATROVENT) 0.03 % nasal spray Place 2 sprays into both nostrils every 12 (twelve) hours. 30 mL 3  . Iron-FA-B Cmp-C-Biot-Probiotic (FUSION PLUS) CAPS Take 2 tablets by mouth daily. 60 capsule 3  . metFORMIN (GLUCOPHAGE) 500 MG tablet Take 1 tablet (500 mg total) by mouth 2 (two) times daily with a meal. 180 tablet 0  . metoprolol succinate (TOPROL-XL) 50 MG 24 hr tablet Take 1 tablet (50 mg total) by mouth in the morning and at bedtime. Take with or immediately following a meal. 180 tablet 3  . pantoprazole (PROTONIX) 40 MG tablet Take 1 tablet (40 mg total) by mouth 2 (two) times daily. 180 tablet 0  . spironolactone (ALDACTONE) 25 MG tablet Take 0.5 tablets (12.5 mg total) by mouth daily. 45 tablet 3  . topiramate (TOPAMAX) 50 MG tablet Take 1.5 tablets (75 mg total) by mouth at bedtime. (Patient taking differently: Take 50 mg by mouth at bedtime.) 135 tablet 3  . Ubrogepant (UBRELVY) 50 MG TABS Take 1 tablet at the onset of a migraine.  Can repeat in 2 hours if needed. 15 tablet 5  . sacubitril-valsartan (ENTRESTO) 49-51 MG Take 1 tablet by mouth 2 (two) times daily. 60 tablet 6  . cyclobenzaprine (FLEXERIL) 5 MG tablet Take 1 tablet (5 mg total) by mouth 3 (three) times daily as needed. (Patient not taking: Reported on 09/22/2020) 30 tablet 0  . metoCLOPramide (REGLAN) 10 MG tablet Take 1 tablet (10 mg  total) by mouth every 8 (eight) hours as needed for up to 7 days for nausea (Headache). (Patient not taking: Reported on 09/22/2020) 21 tablet 0  . ondansetron (ZOFRAN ODT) 4 MG disintegrating tablet Take 1 tablet (4 mg total) by mouth every 8 (eight) hours as needed. (Patient not taking: Reported on 09/22/2020) 20 tablet 6   No facility-administered medications prior to visit.     Allergies:   Venlafaxine   Social History   Socioeconomic History  . Marital status: Single    Spouse name: Not on file  . Number of children: 2  . Years of education: Not on file  . Highest education level: Not on file  Occupational History  . Occupation: Training and development officer: [employer]  Tobacco Use  . Smoking status: Former Smoker    Packs/day: 0.30    Types: Cigarettes    Quit date: 04/01/2012    Years since quitting: 8.4  . Smokeless tobacco: Never Used  Vaping Use  . Vaping Use: Never used  Substance and Sexual Activity  . Alcohol use: Not Currently    Alcohol/week: 0.0 standard drinks    Comment: very occasional  . Drug use: No  . Sexual activity: Yes  Other Topics Concern  . Not on file  Social History Narrative  . Not on file   Social Determinants of Health   Financial Resource Strain: Not on file  Food Insecurity: Not on file  Transportation Needs: Not on file  Physical Activity: Not on file  Stress: Not on file  Social Connections: Not on file     Family History:  The patient's family history includes Alzheimer's disease in her father; Arthritis in her sister; Developmental delay in her son; Diabetes in her brother, mother, and son; Heart disease in her mother; High Cholesterol in her mother and sister; Hypertension in her brother, mother, sister, and sister.   Review of Systems:    Please see the history of present illness.     All other systems reviewed and are otherwise negative except as noted above.   Physical Exam:    VS:  BP 130/84   Pulse (!) 105   Ht  5\' 2"  (1.575 m)   Wt 280 lb (127 kg)   SpO2 99%   BMI 51.21 kg/m    General: Well developed, well nourished,female appearing in no acute distress. Head: Normocephalic, atraumatic. Neck: No carotid bruits. JVD not elevated.  Lungs: Respirations regular and unlabored, without wheezes or rales.  Heart: Regular rate and rhythm. No S3 or S4.  No murmur, no rubs, or gallops appreciated. Abdomen: Appears non-distended. No obvious abdominal masses. Msk:  Strength and tone appear normal for age. No obvious joint deformities or effusions. Extremities: No clubbing or cyanosis. No pitting edema.  Distal pedal pulses are 2+ bilaterally. Neuro: Alert and oriented X 3. Moves all extremities spontaneously. No focal deficits noted. Psych:  Responds to questions appropriately with a normal affect. Skin: No rashes or lesions noted  Wt Readings from Last 3 Encounters:  09/22/20 280 lb (127 kg)  08/28/20 282 lb (127.9 kg)  08/13/20 278 lb (126.1 kg)     Studies/Labs Reviewed:   EKG:  EKG is not ordered today.   Recent Labs: 10/15/2019: Magnesium 1.8 05/13/2020: TSH 3.035 07/09/2020: ALT 39 08/28/2020: B Natriuretic Peptide 100.9; BUN 17; Creatinine, Ser 0.84; Hemoglobin 12.0; Platelets 309; Potassium 4.3; Sodium 140   Lipid Panel    Component Value Date/Time   CHOL 162 05/13/2020 1005   TRIG 156 (H) 05/13/2020 1005   HDL 24 (L) 05/13/2020 1005   CHOLHDL 6.8 05/13/2020 1005   VLDL 31 05/13/2020 1005   LDLCALC 107 (H) 05/13/2020 1005   LDLCALC 107 (H) 12/18/2019 2952    Additional studies/ records that were reviewed today include:   Cardiac Catheterization: 05/2020  Widely patent/normal widely patent coronary arteries.  Right dominant anatomy  Regional wall motion abnormality noted on left ventriculography. EF 40 to 50%.  RECOMMENDATIONS:   Given context of clinical presentation, myopericarditis is now the leading diagnosis in the differential. This presentation is not atypical  stress cardiomyopathy. Recommend MRI/cardiac.   Cardiac MRI: 05/2020 IMPRESSION: 1. Findings consistent with acute myocarditis including markedly elevated native T1 (up to 1459ms), T2 (up to 76 ms), and ECV (up to 64%). In addition there is extensive/diffuse late gadolinium enhancement.  2. While clearly has acute myocarditis, ECV is higher and LGE more extensive than typically seen in myocarditis. This could just  be due to severe inflammation from myocarditis, or could have an underlying cardiomyopathy (does have severe asymmetric LVH that was present on prior echo 03/2019, and given ECV/LGE pattern, underlying amyloid is a consideration) with myocarditis in addition to this. If having ventricular arrhythmias, high degree AV block, or hemodynamic instability, would consider endomyocardial biopsy for further evaluation.  3. Normal LV size, severe asymmetric hypertrophy measuring up to 36mm in basal septum (65mm in posterior wall), and mild systolic dysfunction (EF 35%). Unfortunately basal most LV short axis slice was not included in the data set to calculate EF, which introduces a small amount of error in the EF calculation, though visually EF appears 40-45%  4. RV volumes were not able to be calculated as several basal short axis slices were missing from the data set. Visually RV appears normal size with mild systolic dysfunction (apical akinesis)  5. Moderate pericardial effusion, measures up to 40mm adjacent to LV inferior wall    Limited Echo: 06/2020 IMPRESSIONS    1. Limited study.  2. Left ventricular ejection fraction, by estimation, is approximately  55%. The left ventricle has normal function. The left ventricle has no  regional wall motion abnormalities. There is severe asymmetric left  ventricular hypertrophy of the basal and  septal segments, otherwise moderate.  3. Right ventricular systolic function is normal. The right ventricular  size is  normal.  4. There is a trivial pericardial effusion anterior to the right  ventricle. This has decreased in size in comparison to the prior study in  January 2022.  5. The mitral valve is grossly normal. Mild mitral valve regurgitation.  6. The inferior vena cava is normal in size with greater than 50%  respiratory variability, suggesting right atrial pressure of 3 mmHg.   Assessment:    1. Subacute viral myocarditis   2. History of acute heart failure   3. PAF (paroxysmal atrial fibrillation) ([facility])   4. Palpitations   5. Essential hypertension   6. Medication management      Plan:   In order of problems listed above:  1. Viral Myocarditis - Occurred in 05/2020 with cath showing normal cors but cMRI did show severe asymmetric LVH and concern for possible infiltrative cardiomyopathy. - She has complicated her course of Colchicine and repeat echo showed her pericardial effusion was trivial. She is being followed by [facility] and they are planning for a repeat cMRI in 1-2 months and possible work-up for Amyloid afterwards pending repeat imaging.   2. HFimpEF - EF previously at 41% by cardiac MRI but normalized by repeat echocardiogram. She continues to have significant fatigue and dyspnea on exertion. Does not appear volume overloaded on examination today.  - Continue Toprol-XL 50mg  BID, Spironolactone 12.5mg  daily and Entresto 49-51mg  BID. Will recheck BMET today as this has not been checked since initiation of Entresto. Consider SGLT2-inhibitor pending reassessment of renal function.   3. Paroxysmal Atrial Fibrillation - She does experience intermittent palpitations which could be due to known PAF but she is also at risk for ventricular arrhythmias given possible HCM by recent cardiac MRI. Will plan for 2-week Zio monitor to assess for arrhythmias. Continue Toprol-XL 50mg  BID for rate-control and Eliquis 5mg  BID for anticoagulation.   4. HTN - BP is at 130/84 during today's visit.  Continue Entresto, Toprol-XL and Spironolactone at current dosing for now.    Medication Adjustments/Labs and Tests Ordered: Current medicines are reviewed at length with the patient today.  Concerns regarding medicines are outlined above.  Medication changes, Labs and Tests ordered today are listed in the Patient Instructions below. Patient Instructions  Medication Instructions:  Your physician recommends that you continue on your current medications as directed. Please refer to the Current Medication list given to you today.  *If you need a refill on your cardiac medications before your next appointment, please call your pharmacy*   Lab Work: Your physician recommends that you return for lab work in: Tomorrow   If you have labs (blood work) drawn today and your tests are completely normal, you will receive your results only by: Marland Kitchen MyChart Message (if you have MyChart) OR . A paper copy in the mail If you have any lab test that is abnormal or we need to change your treatment, we will call you to review the results.   Testing/Procedures: [facility] [place]- Long Term Monitor Instructions   Your physician has requested you wear your ZIO patch monitor___14___days.   This is a single patch monitor.  [employer] supplies one patch monitor per enrollment.  Additional stickers are not available.   Please do not apply patch if you will be having a Nuclear Stress Test, Echocardiogram, Cardiac CT, MRI, or Chest Xray during the time frame you would be wearing the monitor. The patch cannot be worn during these tests.  You cannot remove and re-apply the ZIO XT patch monitor.   Your ZIO patch monitor will be sent USPS Priority mail from [facility] directly to your home address. The monitor may also be mailed to a PO BOX if home delivery is not available.   It may take 3-5 days to receive your monitor after you have been enrolled.   Once you have received you monitor, please review enclosed instructions.   Your monitor has already been registered assigning a specific monitor serial # to you.   Applying the monitor   Shave hair from upper left chest.   Hold abrader disc by orange tab.  Rub abrader in 40 strokes over left upper chest as indicated in your monitor instructions.   Clean area with 4 enclosed alcohol pads .  Use all pads to assure are is cleaned thoroughly.  Let dry.   Apply patch as indicated in monitor instructions.  Patch will be place under collarbone on left side of chest with arrow pointing upward.   Rub patch adhesive wings for 2 minutes.Remove white label marked "1".  Remove white label marked "2".  Rub patch adhesive wings for 2 additional minutes.   While looking in a mirror, press and release button in center of patch.  A small green light will flash 3-4 times .  This will be your only indicator the monitor has been turned on.     Do not shower for the first 24 hours.  You may shower after the first 24 hours.   Press button if you feel a symptom. You will hear a small click.  Record Date, Time and Symptom in the Patient Log Book.   When you are ready to remove patch, follow instructions on last 2 pages of Patient Log Book.  Stick patch monitor onto last page of Patient Log Book.   Place Patient Log Book in [place] box.  Use locking tab on box and tape box closed securely.  The [employer] has [employer] on it.  Please place in mailbox as soon as possible.  Your physician should have your test results approximately 7 days after the monitor has been mailed back to  [employer].   Call [facility] at [phone] if you have questions regarding your ZIO XT patch monitor.  Call them immediately if you see an orange light blinking on your monitor.   If your monitor falls off in less than 4 days contact our Monitor department at [phone].  If your monitor becomes loose or falls off after 4 days call [employer] at [phone] for suggestions on  securing your monitor.   Follow-Up: At [facility], you and your health needs are our priority.  As part of our continuing mission to provide you with exceptional heart care, we have created designated Provider Care Teams.  These Care Teams include your primary Cardiologist (physician) and Advanced Practice Providers (APPs -  Physician Assistants and Nurse Practitioners) who all work together to provide you with the care you need, when you need it.  We recommend signing up for the patient portal called "MyChart".  Sign up information is provided on this After Visit Summary.  MyChart is used to connect with patients for Virtual Visits (Telemedicine).  Patients are able to view lab/test results, encounter notes, upcoming appointments, etc.  Non-urgent messages can be sent to your provider as well.   To learn more about what you can do with MyChart, go to [email or web address].    Your next appointment:   2 month(s)  The format for your next appointment:   In Person  Provider:   Bernerd Pho, PA-C   Other Instructions Thank you for choosing [facility]!       Signed, Erma Heritage, PA-C  09/22/2020 8:25 PM    [place] [address] [place],  [address] Phone: [phone] Fax: [phone]

## 2020-09-23 ENCOUNTER — Encounter: Payer: Self-pay | Admitting: Student

## 2020-09-24 ENCOUNTER — Telehealth: Payer: Self-pay

## 2020-09-24 ENCOUNTER — Other Ambulatory Visit (HOSPITAL_COMMUNITY)
Admission: RE | Admit: 2020-09-24 | Discharge: 2020-09-24 | Disposition: A | Discharge status: Home / Self Care | Payer: BC Managed Care – PPO | Source: Ambulatory Visit | Attending: Student | Admitting: Student

## 2020-09-24 DIAGNOSIS — Z79899 Other long term (current) drug therapy: Secondary | ICD-10-CM | POA: Insufficient documentation

## 2020-09-24 LAB — BASIC METABOLIC PANEL
Anion gap: 9 (ref 5–15)
BUN: 18 mg/dL (ref 8–23)
CO2: 25 mmol/L (ref 22–32)
Calcium: 9.4 mg/dL (ref 8.9–10.3)
Chloride: 105 mmol/L (ref 98–111)
Creatinine, Ser: 0.93 mg/dL (ref 0.44–1.00)
GFR, Estimated: >60 mL/min (ref >60)
Glucose, Bld: 137 mg/dL — ABNORMAL HIGH (ref 70–99)
Potassium: 3.8 mmol/L (ref 3.5–5.1)
Sodium: 139 mmol/L (ref 135–145)

## 2020-09-24 MED ORDER — EMPAGLIFLOZIN 10 MG PO TABS: 10.0000 mg | ORAL_TABLET | Freq: Every day | ORAL | 6 refills | Status: DC

## 2020-09-24 NOTE — Telephone Encounter (Signed)
Erma Heritage, PA-C  09/24/2020 11:34 AM EDT      Please let the patient know her electrolytes and kidney function remain within a normal range following recent medication changes. As previously recommended by Advanced Heart Failure, would recommend adding Jardiance 10mg  daily to help with the pumping function of her heart (will need to provide co-pay card and please have her reach out if this is not covered by her insurance). She may experience increased urination with the medication. The most common side effects are UTI's or yeast infections so good hygiene is key and if she starts to experience these effects, please let us know.     Patient agrees to start Jardiance 10 mg daily ,e-scribed to [facility] Patient will self enroll for assistance at [email or web address] and follow prompts.

## 2020-10-07 ENCOUNTER — Telehealth: Payer: Self-pay | Admitting: Student

## 2020-10-07 NOTE — Telephone Encounter (Signed)
Disability Forms from Patient received by Patient on 09/30/20. Completed patient authorization attached. Took to [facility] box for completion.

## 2020-10-09 NOTE — Telephone Encounter (Signed)
Forms completed. Patient has been notified they are ready for pick up. NO CHARGE per BS.

## 2020-10-13 ENCOUNTER — Ambulatory Visit: Payer: BC Managed Care – PPO | Admitting: Genetic Counselor

## 2020-10-13 ENCOUNTER — Other Ambulatory Visit: Payer: Self-pay

## 2020-10-15 DIAGNOSIS — R002 Palpitations: Secondary | ICD-10-CM | POA: Diagnosis not present

## 2020-10-15 NOTE — Progress Notes (Addendum)
Pre Test GC  Referring Provider: Glori Bickers, MD   Referral Reason  Monique Gomez was referred for genetic consult and testing for Non-ischemic cardiomyopathy (NICM) after a recent cardiac MRI detected severe asymmetric hypertrophy with concerns of infiltrative cardiomyopathy.   Personal Medical Information Monique Gomez (III.1 on pedigree), a pleasant 65 year-old [place] American woman with a past history of Afib, is here today with her grandson, Monique Gomez. 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 She reports going to the ER complaining of vomiting and diarrhea, shortness of breath and chest heaviness. She was suspected to have [place] but tested negative. She was found to have elevated troponins. An abnormal EKG led to referral to a cardiologist and she was transferred to the ICU. Her heart cath was normal and she was told of having myocarditis and severe asymmetric left ventricular hypertrophy with EF of 41% and signs of infiltrative cardiomyopathy. She says that her troponins normalized in about 6-7 months but she continues to have heart palpitations, flutters, fatigue and dyspnea upon exertion.     Traditional Risk Factors Karle denies having other cardiac or systemic conditions that can cause non-ischemic cardiomyopathy, namely, ischemic heart disease, infiltrative myocardial disease (amyloidosis, sarcoidosis, hemochromatosis), infection with HIV virus, connective tissue disease (such as systemic lupus erythematosus etc.). She also denies substance abuse (chronic alcohol abuse, cocaine abuse), doxorubicin therapy and of having other cardiovascular diseases (valvular heart disease, HCM). She was diagnosed with hypertension in 2010 and states that it is well controlled. She is noted to have acute myocarditis   Family history [name] (III.1) has a 51 y.o. daughter (IV.1) and a 25 y.o. son with special needs- has developmental delay, mild autism and scoliosis that has since  been repaired. Her three grandsons (V.1-V.3), ages 76, 73 and 67 are in good health.   [name] has two younger sisters, ages 7 and 5 (III.2, III.3) and a brother (III.4) who died of diabetes complications at age 13. Her sisters have arthritis related health issues. There is no history of heart disease in her siblings or their children (IV.3-IV.11).  Both parents (II.6, II.7) died at 63 from Alzheimer's disease. Maternal uncle (II.8) died in his 54s from walking pneumonia. Maternal grandmother (I.4) died in her 67s from breast cancer. There are no reports of heart disease amongst her maternal and paternal relatives (I.1, I.2, II.1-II.5) all of whom died in their 36s from natural causes.   Pre-test Genetic Consult notes  I reviewed the different genetic cardiomyopathies that are considered non-ischemic cardiomyopathy, namely ARVC (now called ACM), DCM, HCM and LVNC. I discussed the genetics of HCM, DCM, LVNC and ACM, namely inheritance, incomplete penetrance, variable expression and digenic/compound mutations that can be seen in some patients. We walked through the process of genetic testing. I explained to her that there are three possible outcomes of genetic testing; namely positive, negative and finding a variant of unknown significance. A positive outcome can be expected in cases that do not have risk factors for a cardiomyopathy, present early in life with increased severity and have a family history of sudden cardiac death and/or a relative that has been diagnosed with cardiomyopathy. Limitations in current genetic testing methodology can produce a negative result. Variants of unknown significance (VUS) are also observed. I explained to her that typically a VUS is so classified if the variant is not well understood as very few individuals have been reported to harbor this variant or its role in gene function has not  been elucidated. The potential outcomes of genetic testing and subsequent management of  at-risk family members were discussed so as to manage expectations.   Impression  In summary, [name]'s presentation is atypical with myocarditis further confounding her diagnosis. There is no history of heart disease amongst her siblings, parents or other family members, indicating that she may have a de novo mutation.  In light of her recent finding of myocarditis it is important to consider desmoplakin cardiomyopathy that is a distinct form of arrhythmogenic cardiomyopathy that is characterized by episodic myocardial injury, LV fibrosis that precedes systolic dysfunction and high incidence of ventricular arrhythmias. Genetic testing for genes implicated in nonischemic cardiomyopathy is highly recommended to confirm her diagnosis. The test will determine the underlying genetic basis of her condition and stratify risk of NICM in his family.   In addition, we discussed the protections afforded by the Genetic Information Non-Discrimination Act (GINA). I explained to her that GINA protects her from losing her employment or health insurance based on her genotype. However, these protections do not cover life insurance and disability. She verbalized understanding of this and notes that her children have life insurance.  Please note that the patient has not been counseled in this visit on personal, cultural or ethical issues that she may face due to her heart condition.   Plan After a thorough discussion of the risk and benefits of genetic testing for NICM, [name] states her intent to pursue genetic testing for NICM and signed the informed consent form. Blood was drawn today for testing.    [name], Ph.D, [facility] Clinical Molecular Geneticist

## 2020-10-19 ENCOUNTER — Telehealth: Payer: Self-pay | Admitting: *Deleted

## 2020-10-19 NOTE — Telephone Encounter (Signed)
PA for Emgality started on covemymeds (key: [id number]). Pt has coverage through [facility] [phone]). Decision pending.

## 2020-10-19 NOTE — Telephone Encounter (Signed)
PA approved through 10/18/2021.

## 2020-10-20 ENCOUNTER — Ambulatory Visit: Payer: BC Managed Care – PPO | Admitting: Nurse Practitioner

## 2020-10-21 ENCOUNTER — Encounter (HOSPITAL_COMMUNITY): Payer: Self-pay | Admitting: Internal Medicine

## 2020-10-21 ENCOUNTER — Ambulatory Visit (HOSPITAL_COMMUNITY)
Admission: RE | Admit: 2020-10-21 | Discharge: 2020-10-21 | Disposition: A | Discharge status: Home / Self Care | Payer: BC Managed Care – PPO | Source: Ambulatory Visit | Attending: Internal Medicine | Admitting: Internal Medicine

## 2020-10-21 ENCOUNTER — Other Ambulatory Visit: Payer: Self-pay

## 2020-10-21 ENCOUNTER — Other Ambulatory Visit (HOSPITAL_COMMUNITY): Payer: Self-pay | Admitting: *Deleted

## 2020-10-21 VITALS — BP 122/90 | HR 97 | Wt 284.0 lb

## 2020-10-21 DIAGNOSIS — I400 Infective myocarditis: Secondary | ICD-10-CM | POA: Insufficient documentation

## 2020-10-21 DIAGNOSIS — I110 Hypertensive heart disease with heart failure: Secondary | ICD-10-CM | POA: Insufficient documentation

## 2020-10-21 DIAGNOSIS — Z8249 Family history of ischemic heart disease and other diseases of the circulatory system: Secondary | ICD-10-CM | POA: Insufficient documentation

## 2020-10-21 DIAGNOSIS — Z79899 Other long term (current) drug therapy: Secondary | ICD-10-CM | POA: Insufficient documentation

## 2020-10-21 DIAGNOSIS — I10 Essential (primary) hypertension: Secondary | ICD-10-CM | POA: Diagnosis not present

## 2020-10-21 DIAGNOSIS — E785 Hyperlipidemia, unspecified: Secondary | ICD-10-CM | POA: Diagnosis not present

## 2020-10-21 DIAGNOSIS — Z7984 Long term (current) use of oral hypoglycemic drugs: Secondary | ICD-10-CM | POA: Insufficient documentation

## 2020-10-21 DIAGNOSIS — I5022 Chronic systolic (congestive) heart failure: Secondary | ICD-10-CM | POA: Insufficient documentation

## 2020-10-21 DIAGNOSIS — I480 Paroxysmal atrial fibrillation: Secondary | ICD-10-CM | POA: Diagnosis not present

## 2020-10-21 DIAGNOSIS — G4733 Obstructive sleep apnea (adult) (pediatric): Secondary | ICD-10-CM | POA: Diagnosis not present

## 2020-10-21 DIAGNOSIS — E119 Type 2 diabetes mellitus without complications: Secondary | ICD-10-CM | POA: Insufficient documentation

## 2020-10-21 DIAGNOSIS — Z7901 Long term (current) use of anticoagulants: Secondary | ICD-10-CM | POA: Diagnosis not present

## 2020-10-21 DIAGNOSIS — Z6841 Body Mass Index (BMI) 40.0 and over, adult: Secondary | ICD-10-CM | POA: Diagnosis not present

## 2020-10-21 DIAGNOSIS — I252 Old myocardial infarction: Secondary | ICD-10-CM | POA: Insufficient documentation

## 2020-10-21 MED ORDER — SACUBITRIL-VALSARTAN 97-103 MG PO TABS: 1.0000 | ORAL_TABLET | Freq: Two times a day (BID) | ORAL | 3 refills | Status: DC

## 2020-10-21 NOTE — Patient Instructions (Addendum)
Increase Entresto to 97/103 twice daily (Rx sent). Schedule CPX (cardiac exercise test). Will check on your ordered cardiac MRI and call you with info. Return to Heart Failure clinic in 4 months.

## 2020-10-21 NOTE — Progress Notes (Addendum)
ADVANCED HF CLINIC NOTE  Referring Physician: Rozann Lesches, MD Primary Care: Lindell Spar, MD Primary Cardiologist: Rozann Lesches, MD   HPI:  Monique Gomez is a morbidly obese 65 y.o. female with PAF, viral myocarditis (occurring in 05/2020 with cath showing normal cors but MRI did show severe asymmetric LVH and concern for possible infiltrative cardiomyopathy), HTN, HLD, OSA (on CPAP) and DM2 referred by Dr. Domenic Polite for further evaluation of her cardiomyopathy.  Says several years ago BP was very high when she had severe migraine HAs. 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  Developed viral illness in 1/22. Felt she had COVID but tested negative (was vaccinated). Then developed progressive SOB, diarrhea, n/v, chest pain and SOB.    Admitted to [facility] in 05/2020 for evaluation of an NSTEMI with elevated troponin values peaking at greater than 27,000 Given her enzyme elevation and EKG abnormalities (inferolateral ST elevation), she underwent a cardiac catheterization on 05/14/2020 which showed normal coronary arteries.  Cardiac MRI was consistent with acute myocarditis but also raised the potential of HCM +/- infiltrative process. Viral panel was positive for RSV.   cMRI 05/15/20 1 Findings consistent with acute myocarditis including markedly elevated native T1 (up to 1432ms), T2 (up to 76 ms), and ECV (up to 64%). In addition there is extensive/diffuse late gadolinium enhancement.While clearly has acute myocarditis, ECV is higher and LGE more extensive than typically seen in myocarditis. This could just be due to severe inflammation from myocarditis, or could have an underlying infiltrative  cardiomyopathy   2. Normal LV size, severe asymmetric hypertrophy measuring up to 56mm in basal septum (1mm in posterior wall), and mild systolic dysfunction (EF 63%).  3. RV with mild systolic dysfunction (apical akinesis) 4. Moderate  pericardial effusion  Echo 2/22: LVEF 55% Severe asymmetric left  ventricular hypertrophy of the basal and  septal segments, otherwise moderate. Trivial pericardial effusion   Says she is feeling a little better but still very fatigued and SOB with minimal activities. No edema, orthopnea or PND.   Mother had CAD and had a stent. Died at 70 from Alzheimer's Dad died at 28 from Alzheimer's No hart disease 2S and 1 B  1B died from DM2 complications No heart issues 2S no heart issues No relatives with weak hearts, thick hearts or SCD   14 day cardiac monitor results 10/16/20   Monitor 1 predominant rhythm NSR, intermittent LBBB  Monitor 2 predominant NSR, 2 NSVT runs longest 10 beats, 16 SVT runs longest 7 beats at rate of 197.  Wenckebach episode present,   Here for FU visit, has not completed repeat Cardiac MRI, Myeloma panel was unremarkable.  Seen by Dr. Broadus John 10/13/20 felt she could have a de novo mutation, he ordered genetic testing and to consider desmoplakin cardiomyopathy.  Seen by [facility] one month ago jardiance added.  Still having fatigue, HR goes up with movement.  Occasional dyspnea with overexerting herself but has improved significantly.  No side effects from jardiance.      Past Medical History:  Diagnosis Date   Abdominal fibromatosis    ACS (acute coronary syndrome) (HCC)    Allergic rhinitis    Anemia    Asthma    Essential hypertension    GERD (gastroesophageal reflux disease)    Headache(784.0)    History of cardiac catheterization    No significant CAD May 2015   History of migraine headaches  Hyperlipidemia    Migraines    Myocarditis ([place])    a. diagnosed in 05/2020 with cath showing normal cors   Obesity    PAF (paroxysmal atrial fibrillation) ([place]) 08/2013   Sleep apnea    Type 2 diabetes mellitus (HCC)     Current Outpatient Medications  Medication Sig Dispense Refill   acetaminophen (TYLENOL) 500 MG tablet Take 1 tablet (500 mg total) by mouth  every 6 (six) hours as needed. 30 tablet 0   apixaban (ELIQUIS) 5 MG TABS tablet Take 1 tablet (5 mg total) by mouth 2 (two) times daily. 60 tablet 3   atorvastatin (LIPITOR) 80 MG tablet Take 1 tablet (80 mg total) by mouth daily. 90 tablet 0   empagliflozin (JARDIANCE) 10 MG TABS tablet Take 1 tablet (10 mg total) by mouth daily before breakfast. 30 tablet 6   fluticasone (FLONASE) 50 MCG/ACT nasal spray Place 2 sprays into both nostrils daily.     Galcanezumab-gnlm (EMGALITY) 120 MG/ML SOAJ Inject 120 mg into the skin every 30 (thirty) days. 1.12 mL 11   ipratropium (ATROVENT) 0.03 % nasal spray Place 2 sprays into both nostrils every 12 (twelve) hours. 30 mL 3   Iron-FA-B Cmp-C-Biot-Probiotic (FUSION PLUS) CAPS Take 2 tablets by mouth daily. 60 capsule 3   metFORMIN (GLUCOPHAGE) 500 MG tablet Take 1 tablet (500 mg total) by mouth 2 (two) times daily with a meal. 180 tablet 0   metoCLOPramide (REGLAN) 10 MG tablet Take 1 tablet (10 mg total) by mouth every 8 (eight) hours as needed for up to 7 days for nausea (Headache). 21 tablet 0   metoprolol succinate (TOPROL-XL) 50 MG 24 hr tablet Take 1 tablet (50 mg total) by mouth in the morning and at bedtime. Take with or immediately following a meal. 180 tablet 3   pantoprazole (PROTONIX) 40 MG tablet Take 1 tablet (40 mg total) by mouth 2 (two) times daily. 180 tablet 0   sacubitril-valsartan (ENTRESTO) 49-51 MG Take 1 tablet by mouth 2 (two) times daily. 180 tablet 3   topiramate (TOPAMAX) 50 MG tablet Take 50 mg by mouth once. daily     Ubrogepant (UBRELVY) 50 MG TABS Take 1 tablet at the onset of a migraine.  Can repeat in 2 hours if needed. 15 tablet 5   No current facility-administered medications for this encounter.    Allergies  Allergen Reactions   Venlafaxine Nausea And Vomiting and Other (See Comments)    Dizziness, shakiness      Social History   Socioeconomic History   Marital status: Single    Spouse name: Not on file    Number of children: 2   Years of education: Not on file   Highest education level: Not on file  Occupational History   Occupation: Education officer, museum    Employer: [employer]  Tobacco Use   Smoking status: Former    Packs/day: 0.30    Pack years: 0.00    Types: Cigarettes    Quit date: 04/01/2012    Years since quitting: 8.5   Smokeless tobacco: Never  Vaping Use   Vaping Use: Never used  Substance and Sexual Activity   Alcohol use: Not Currently    Alcohol/week: 0.0 standard drinks    Comment: very occasional   Drug use: No   Sexual activity: Yes  Other Topics Concern   Not on file  Social History Narrative   Not on file   Social Determinants of Health   Financial  Resource Strain: Not on file  Food Insecurity: Not on file  Transportation Needs: Not on file  Physical Activity: Not on file  Stress: Not on file  Social Connections: Not on file  Intimate Partner Violence: Not on file      Family History  Problem Relation Age of Onset   Diabetes Mother    Hypertension Mother    Heart disease Mother    High Cholesterol Mother    Alzheimer's disease Father    Hypertension Sister    Hypertension Brother    Diabetes Brother    Arthritis Sister    Hypertension Sister    High Cholesterol Sister    Diabetes Son    Developmental delay Son    Colon cancer Neg Hx     Vitals:   10/21/20 1010  BP: 122/90  Pulse: 97  SpO2: 98%  Weight: 128.8 kg (284 lb)    PHYSICAL EXAM: General:  Obese woman Well appearing. No respiratory difficulty HEENT: normal Neck: supple. no JVD. Carotids 2+ bilat; no bruits. No lymphadenopathy or thryomegaly appreciated. Cor: PMI nondisplaced. Regular rate & rhythm. No rubs, gallops or murmurs. Lungs: clear Abdomen: soft, nontender, nondistended. No hepatosplenomegaly. No bruits or masses. Good bowel sounds. Extremities: no cyanosis, clubbing, rash, edema Neuro: alert & oriented x 3, cranial nerves grossly intact. moves all 4 extremities  w/o difficulty. Affect pleasant.  ECG: NSR 78 RBBB Personally reviewed   ASSESSMENT & PLAN:  1. Viral Myocarditis with concern for possible underlying infiltrative CM - This occurred in 05/2020 and catheterization at that time showed normal cors with Cardiac MRI confirming severe  myocarditis but was also noted to have severe asymmetric LVH and concern for possible infiltrative cardiomyopathy with very high ECV.  - Now off colchicine - EF normalized on echo 2/22 - Will repeat cMRI. Myeloma panel was negative    2. Chronic systolic HF with recovered EF - Due to acute myocarditis in 1/22. EF 41% - Echo 2/22 EF 55% - NYHA III - Volume status looks ok. ReDS 33% (normal lung water)  - Increase Entresto to 97/103 bid - Continue spiro 12.5 - Continue Toprol 50 bid - Add Jardiance soon  3. 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 Infiltrative CM also a possibility - No evidence of SAM or LVOT obstruction on echo  - Has seen Dr. Broadus John. Genetic w/u underway - repeat MRI as above. May need PYP. Check myeloma panel  - Once further out from myocarditis will need CPX testing and Zio monior to risk stratify need for ICD - Continue Toprol    4. Paroxymal Atrial Fibrillation - She does experience occasional palpitations but symptoms have overall improved. In NSR by examination today. Continue Toprol-XL 50mg  BID for rate-control.  - No reports of active bleeding. Continue Eliquis 5mg  BID for anticoagulation.   5. HTN - this has been longstanding - BP now improved  6. Morbid obesity Body mass index is 51.94 kg/m.    Katherine Roan, MD  10:37 AM  Patient seen and examined with the above-signed Advanced Practice Provider and/or Housestaff. I personally reviewed laboratory data, imaging studies and relevant notes. I independently examined the patient and formulated the important aspects of the plan. I have edited the note to  reflect any of my changes or salient points. I have personally discussed the plan with the patient and/or family.  Continues to c/o of NYHA III dyspnea and fatigue. Echo in  2/22 showed normalization of LV function. Has not had repeat cMRI. Says she was completely asymptomatic prior to the beginning of this year despite her weight.  General:  Obese woman No resp difficulty HEENT: normal Neck: supple. no JVD. Carotids 2+ bilat; no bruits. No lymphadenopathy or thryomegaly appreciated. Cor: PMI nondisplaced. Regular rate & rhythm. No rubs, gallops or murmurs. Lungs: clear Abdomen: obese soft, nontender, nondistended. No hepatosplenomegaly. No bruits or masses. Good bowel sounds. Extremities: no cyanosis, clubbing, rash, edema Neuro: alert & orientedx3, cranial nerves grossly intact. moves all 4 extremities w/o difficulty. Affect pleasant  Has had little improvement in her symptoms despite normalization of LV function and titration of GDMT. At this point, I am not convinced that HF is the main driver of her symptoms. ReDS normal at 33%. Will check cMRI and CPX testing. Needs sleep study.   Glori Bickers, MD  11:31 AM

## 2020-10-28 DIAGNOSIS — I5022 Chronic systolic (congestive) heart failure: Secondary | ICD-10-CM | POA: Diagnosis not present

## 2020-10-28 DIAGNOSIS — I10 Essential (primary) hypertension: Secondary | ICD-10-CM | POA: Diagnosis not present

## 2020-10-28 DIAGNOSIS — I409 Acute myocarditis, unspecified: Secondary | ICD-10-CM | POA: Diagnosis not present

## 2020-10-29 DIAGNOSIS — I10 Essential (primary) hypertension: Secondary | ICD-10-CM | POA: Diagnosis not present

## 2020-10-29 DIAGNOSIS — I5022 Chronic systolic (congestive) heart failure: Secondary | ICD-10-CM | POA: Diagnosis not present

## 2020-10-29 DIAGNOSIS — I409 Acute myocarditis, unspecified: Secondary | ICD-10-CM | POA: Diagnosis not present

## 2020-11-04 DIAGNOSIS — I10 Essential (primary) hypertension: Secondary | ICD-10-CM | POA: Diagnosis not present

## 2020-11-04 DIAGNOSIS — I5022 Chronic systolic (congestive) heart failure: Secondary | ICD-10-CM | POA: Diagnosis not present

## 2020-11-04 DIAGNOSIS — I409 Acute myocarditis, unspecified: Secondary | ICD-10-CM | POA: Diagnosis not present

## 2020-11-05 ENCOUNTER — Encounter: Payer: Self-pay | Admitting: *Deleted

## 2020-11-05 ENCOUNTER — Telehealth: Payer: Self-pay | Admitting: Student

## 2020-11-05 DIAGNOSIS — I10 Essential (primary) hypertension: Secondary | ICD-10-CM | POA: Diagnosis not present

## 2020-11-05 DIAGNOSIS — I5022 Chronic systolic (congestive) heart failure: Secondary | ICD-10-CM | POA: Diagnosis not present

## 2020-11-05 DIAGNOSIS — I409 Acute myocarditis, unspecified: Secondary | ICD-10-CM | POA: Diagnosis not present

## 2020-11-05 NOTE — Telephone Encounter (Signed)
New Message    Fax [phone]   Patient employers called they need specific restrictions for this employee, they are trying to accommodate this employee by letting her work from home but they need to know exactly what her restrictions are.  If patient is being taken out on disability they need to know this as well, patient has been out of work for 7 months.

## 2020-11-05 NOTE — Telephone Encounter (Signed)
   I called and spoke to the patient about her current symptoms and what she thinks she would be able to do while working from home. Please copy and paste the below information into a letter which can be faxed to the requesting party.   DEMIA VIERA continues to remain under the care of [place] and [facility] since her hospitalization in 05/2020. She continues to have significant fatigue along with palpitations and dyspnea and is still undergoing cardiac work-up for this. In speaking with the patient, she reports that she could work from home for intermittent periods of time but feels like she would need frequent breaks due to intermittent symptoms. We discussed possibly working for 35 to 45-minute intervals up to 3-4 occurrences per day. Pending how she feels with this along with reassessment of symptoms at follow-up visits, this could possibly be increased over time. She is still not driving for long distances given her intermittent symptoms, therefore it would be recommended that she be able to work from home if able as she relies on family members for longer periods of transportation.   Signed, Erma Heritage, PA-C 11/05/2020, 11:49 AM

## 2020-11-05 NOTE — Telephone Encounter (Signed)
Letter completed.

## 2020-11-06 DIAGNOSIS — I10 Essential (primary) hypertension: Secondary | ICD-10-CM | POA: Diagnosis not present

## 2020-11-06 DIAGNOSIS — I5022 Chronic systolic (congestive) heart failure: Secondary | ICD-10-CM | POA: Diagnosis not present

## 2020-11-06 DIAGNOSIS — I409 Acute myocarditis, unspecified: Secondary | ICD-10-CM | POA: Diagnosis not present

## 2020-11-09 DIAGNOSIS — I409 Acute myocarditis, unspecified: Secondary | ICD-10-CM | POA: Diagnosis not present

## 2020-11-09 DIAGNOSIS — I10 Essential (primary) hypertension: Secondary | ICD-10-CM | POA: Diagnosis not present

## 2020-11-09 DIAGNOSIS — I5022 Chronic systolic (congestive) heart failure: Secondary | ICD-10-CM | POA: Diagnosis not present

## 2020-11-10 DIAGNOSIS — I409 Acute myocarditis, unspecified: Secondary | ICD-10-CM | POA: Diagnosis not present

## 2020-11-10 DIAGNOSIS — I5022 Chronic systolic (congestive) heart failure: Secondary | ICD-10-CM | POA: Diagnosis not present

## 2020-11-10 DIAGNOSIS — I10 Essential (primary) hypertension: Secondary | ICD-10-CM | POA: Diagnosis not present

## 2020-11-11 DIAGNOSIS — I10 Essential (primary) hypertension: Secondary | ICD-10-CM | POA: Diagnosis not present

## 2020-11-11 DIAGNOSIS — I5022 Chronic systolic (congestive) heart failure: Secondary | ICD-10-CM | POA: Diagnosis not present

## 2020-11-11 DIAGNOSIS — I409 Acute myocarditis, unspecified: Secondary | ICD-10-CM | POA: Diagnosis not present

## 2020-11-13 ENCOUNTER — Encounter: Payer: BC Managed Care – PPO | Admitting: Internal Medicine

## 2020-11-13 ENCOUNTER — Telehealth (HOSPITAL_COMMUNITY): Payer: Self-pay | Admitting: *Deleted

## 2020-11-13 NOTE — Telephone Encounter (Signed)
Submitted 09/07/20

## 2020-11-22 ENCOUNTER — Other Ambulatory Visit: Payer: Self-pay | Admitting: Internal Medicine

## 2020-11-25 ENCOUNTER — Telehealth (HOSPITAL_COMMUNITY): Payer: Self-pay | Admitting: *Deleted

## 2020-11-25 NOTE — Telephone Encounter (Signed)
[employer] auth approved. Message sent to Bernestine Amass to schedule.

## 2020-11-30 NOTE — Progress Notes (Signed)
Cardiology Office Note    Date:  12/01/2020   ID:  Monique Gomez, DOB 10-Apr-1956, MRN [id number]  PCP:  Lindell Spar, MD  Cardiologist: Rozann Lesches, MD    Chief Complaint  Patient presents with   Follow-up    2 month visit    History of Present Illness:    Monique Gomez is a 65 y.o. female with past medical history of paroxysmal atrial fibrillation, acute viral myocarditis (occurring in 05/2020 with cath showing normal cors but MRI did show severe asymmetric LVH and concern for possible infiltrative cardiomyopathy), HTN, HLD, OSA (on CPAP), Type II DM and GERD who presents to the office today for 25-monthfollow-up.   She was last examined by Dr. BHaroldine Lawsin 09/2020 and reported still having fatigue and dyspnea on exertion. Was recently started on Jardiance and overall tolerating well. Recent myeloma panel was negative for further work-up of possible infiltrative cardiomyopathy and it was recommended she have a repeat cMRI along with CPX testing. Entresto was titrated to 97-'103mg'$  BID.   In talking with the patient today, she reports that her fatigue has improved since her last office visit and she is now able to stay awake for a longer period of time. Has also noticed improvement in her energy level has been able to go to the grocery store independently but still uses the electronic wheelchair due to her dyspnea. Working part-time remotely from home. Still reports having dyspnea on exertion but no associated chest pain. Reports intermittent palpitations which typically occur with activity and resolve with rest. No specific orthopnea, PND or pitting edema.    She does experience paresthesias along her arms and legs but says this has occurred since being started on Topamax for migraines per Neurology. No progression of symptoms with recent cardiac medication adjustments.  Past Medical History:  Diagnosis Date   Abdominal fibromatosis    ACS (acute coronary syndrome) (HCC)     Allergic rhinitis    Anemia    Asthma    Essential hypertension    GERD (gastroesophageal reflux disease)    Headache(784.0)    History of cardiac catheterization    No significant CAD May 2015   History of migraine headaches    Hyperlipidemia    Migraines    [facility]    a. diagnosed in 05/2020 with cath showing normal cors   Obesity    PAF [place]mal [place] fibrillation) [place] [place]   Sleep apnea    Type 2 diabetes mellitus ([facility]     Past Surgical History:  Procedure Laterality Date   CATARACT EXTRACTION     CATARACT EXTRACTION W/PHACO Right 04/05/2019   Procedure: CATARACT EXTRACTION PHACO AND INTRAOCULAR LENS PLACEMENT RIGHT EYE (CDE: 3.19);  Surgeon: WBaruch Goldmann MD;  Location: [facility];  Service: Ophthalmology;  Laterality: Right;   COLONOSCOPY N/A 02/02/2015   SLF: 1. one colon polyp removed-no source for anemia identified. 2. moderate diverticulosis noted in the sigmoid colon and descending colon 3. the left colon is redundant 4. Rectal bleeding due ot small internal hemorroids 5. Moderate sized external hemorrhoids.   ESOPHAGOGASTRODUODENOSCOPY N/A 02/02/2015   SLF: 1. 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 large hiatal hernia 3. mild non-erosive gastritis 4. No source for anemia identified.    LEFT HEART CATH AND CORONARY ANGIOGRAPHY N/A 05/14/2020   Procedure: LEFT HEART CATH AND CORONARY ANGIOGRAPHY;  Surgeon: SBelva Crome MD;  [facility];  Service: Cardiovascular;  Laterality: N/A;  LEFT HEART CATHETERIZATION WITH CORONARY ANGIOGRAM N/A 09/03/2013   Procedure: LEFT HEART CATHETERIZATION WITH CORONARY ANGIOGRAM;  Surgeon: Jettie Booze, MD;  Location: [facility] CATH LAB;  Service: Cardiovascular;  Laterality: N/A;   Right carpal tunnel release      Current Medications: Outpatient Medications Prior to Visit  Medication Sig Dispense Refill   acetaminophen (TYLENOL) 500 MG tablet Take 1 tablet (500 mg total) by mouth every 6  (six) hours as needed. 30 tablet 0   apixaban (ELIQUIS) 5 MG TABS tablet Take 1 tablet (5 mg total) by mouth 2 (two) times daily. 60 tablet 3   atorvastatin (LIPITOR) 80 MG tablet Take 1 tablet by mouth once daily 90 tablet 0   empagliflozin (JARDIANCE) 10 MG TABS tablet Take 1 tablet (10 mg total) by mouth daily before breakfast. 30 tablet 6   fluticasone (FLONASE) 50 MCG/ACT nasal spray Place 2 sprays into both nostrils daily.     Galcanezumab-gnlm (EMGALITY) 120 MG/ML SOAJ Inject 120 mg into the skin every 30 (thirty) days. 1.12 mL 11   ipratropium (ATROVENT) 0.03 % nasal spray Place 2 sprays into both nostrils every 12 (twelve) hours. 30 mL 3   Iron-FA-B Cmp-C-Biot-Probiotic (FUSION PLUS) CAPS Take 2 tablets by mouth daily. 60 capsule 3   metFORMIN (GLUCOPHAGE) 500 MG tablet TAKE 1 TABLET BY MOUTH TWICE DAILY WITH A MEAL 180 tablet 0   metoCLOPramide (REGLAN) 10 MG tablet Take 1 tablet (10 mg total) by mouth every 8 (eight) hours as needed for up to 7 days for nausea (Headache). 21 tablet 0   metoprolol succinate (TOPROL-XL) 50 MG 24 hr tablet Take 1 tablet (50 mg total) by mouth in the morning and at bedtime. Take with or immediately following a meal. 180 tablet 3   pantoprazole (PROTONIX) 40 MG tablet Take 1 tablet (40 mg total) by mouth 2 (two) times daily. 180 tablet 0   sacubitril-valsartan (ENTRESTO) 97-103 MG Take 1 tablet by mouth 2 (two) times daily. 180 tablet 3   topiramate (TOPAMAX) 50 MG tablet Take 50 mg by mouth once. daily     Ubrogepant (UBRELVY) 50 MG TABS Take 1 tablet at the onset of a migraine.  Can repeat in 2 hours if needed. 15 tablet 5   No facility-administered medications prior to visit.     Allergies:   Venlafaxine   Social History   Socioeconomic History   Marital status: Single    Spouse name: Not on file   Number of children: 2   Years of education: Not on file   Highest education level: Not on file  Occupational History   Occupation: Education officer, museum     Employer: [employer]  Tobacco Use   Smoking status: Former    Packs/day: 0.30    Types: Cigarettes    Quit date: 04/01/2012    Years since quitting: 8.6   Smokeless tobacco: Never  Vaping Use   Vaping Use: Never used  Substance and Sexual Activity   Alcohol use: Not Currently    Alcohol/week: 0.0 standard drinks    Comment: very occasional   Drug use: No   Sexual activity: Yes  Other Topics Concern   Not on file  Social History Narrative   Not on file   Social Determinants of Health   Financial Resource Strain: Not on file  Food Insecurity: Not on file  Transportation Needs: Not on file  Physical Activity: Not on file  Stress: Not on file  Social Connections: Not  on file     Family History:  The patient's family history includes Alzheimer's disease in her father; Arthritis in her sister; Developmental delay in her son; Diabetes in her brother, mother, and son; Heart disease in her mother; High Cholesterol in her mother and sister; Hypertension in her brother, mother, sister, and sister.   Review of Systems:    Please see the history of present illness.     All other systems reviewed and are otherwise negative except as noted above.   Physical Exam:    VS:  BP 130/84   Pulse 74   Ht '5\' 2"'$  (1.575 m)   Wt 284 lb (128.8 kg)   SpO2 99%   BMI 51.94 kg/m    General: Well developed, well nourished,female appearing in no acute distress. Head: Normocephalic, atraumatic. Neck: No carotid bruits. JVD not elevated.  Lungs: Respirations regular and unlabored, without wheezes or rales.  Heart: Regular rate and rhythm. No S3 or S4.  No murmur, no rubs, or gallops appreciated. Abdomen: Appears non-distended. No obvious abdominal masses. Msk:  Strength and tone appear normal for age. No obvious joint deformities or effusions. Extremities: No clubbing or cyanosis. No pitting edema.  Distal pedal pulses are 2+ bilaterally. Neuro: Alert and oriented X 3. Moves all extremities  spontaneously. No focal deficits noted. Psych:  Responds to questions appropriately with a normal affect. Skin: No rashes or lesions noted  Wt Readings from Last 3 Encounters:  12/01/20 284 lb (128.8 kg)  10/21/20 284 lb (128.8 kg)  09/22/20 280 lb (127 kg)     Studies/Labs Reviewed:   EKG:  EKG is not ordered today.   Recent Labs: 05/13/2020: TSH 3.035 07/09/2020: ALT 39 08/28/2020: B Natriuretic Peptide 100.9; Hemoglobin 12.0; Platelets 309 09/24/2020: BUN 18; Creatinine, Ser 0.93; Potassium 3.8; Sodium 139   Lipid Panel    Component Value Date/Time   CHOL 162 05/13/2020 1005   TRIG 156 (H) 05/13/2020 1005   HDL 24 (L) 05/13/2020 1005   CHOLHDL 6.8 05/13/2020 1005   VLDL 31 05/13/2020 1005   LDLCALC 107 (H) 05/13/2020 1005   LDLCALC 107 (H) 12/18/2019 [id number]    Additional studies/ records that were reviewed today include:   Cardiac Catheterization: 05/2020 Widely patent/normal widely patent coronary arteries. Right dominant anatomy Regional wall motion abnormality noted on left ventriculography. EF 40 to 50%.   RECOMMENDATIONS:   Given context of clinical presentation, myopericarditis is now the leading diagnosis in the differential. This presentation is not atypical stress cardiomyopathy. Recommend MRI/cardiac.   Limited Echo: 06/2020 IMPRESSIONS     1. Limited study.   2. Left ventricular ejection fraction, by estimation, is approximately  55%. The left ventricle has normal function. The left ventricle has no  regional wall motion abnormalities. There is severe asymmetric left  ventricular hypertrophy of the basal and  septal segments, otherwise moderate.   3. Right ventricular systolic function is normal. The right ventricular  size is normal.   4. There is a trivial pericardial effusion anterior to the right  ventricle. This has decreased in size in comparison to the prior study in  January 2022.   5. The mitral valve is grossly normal. Mild mitral valve  regurgitation.   6. The inferior vena cava is normal in size with greater than 50%  respiratory variability, suggesting right atrial pressure of 3 mmHg.   Event Monitor: 08/2020 Monitor #1 includes analysis of 1 day and 13 hours.  Predominant rhythm is sinus with heart rate  ranging from 47 bpm up to 143 bpm with average heart rate 77 bpm.  Occasional PACs noted at approximately 1.1% total beats with otherwise rare atrial couplets.  There were also rare PVCs representing less than 1% total beats.   Monitor #2 includes analysis of 13 days and 21 hours.  Predominant rhythm is sinus with heart rate ranging from 39 bpm up to 146 bpm and average heart rate 74 bpm.  Brief episode of Wenckebach noted, also intermittent IVCD.  There were no pauses.  There were occasional PACs representing 1.8% total beats with otherwise rare atrial couplets and triplets.  There were also rare PVCs including couplets and triplets representing less than 1% total beats.  There were 2 episodes of NSVT, the longest of which lasted 10 beats.  There were also 16 episodes of SVT, the longest of which lasted for 18 beats with average heart rate 165 bpm.  Assessment:    1. History of viral myocarditis   2. History of cardiomyopathy   3. PAF (paroxysmal atrial fibrillation) ([place])   4. Essential hypertension      Plan:   In order of problems listed above:  1. Viral Myocarditis - This was initially diagnosed in 05/2020 with catheterization at that time showing normal cors and cardiac MRI showed severe asymmetric LVH and possible infiltrative cardiomyopathy. She is being followed by [facility] with plans for a repeat cMRI and CPX testing within the next month.  2. HFimpEF - Her EF was previously at 41% by cardiac MRI in 05/2020 but has normalized by repeat imaging. Her fatigue and dyspnea continue to improve but are not yet back to baseline. - Continue current medical therapy with Jardiance 10 mg daily, Toprol-XL 50  mg twice daily and Entresto 97-'103mg'$  BID.   3. Paroxysmal Atrial Fibrillation - Recent monitor showed predominantly normal sinus rhythm with occasional PAC's, PVC's, brief NSVT and brief SVT. She does experience occasional palpitations still but symptoms have improved. Remains on Toprol-XL 50 mg twice daily for rate control. She is also on Eliquis 5 mg twice daily for anticoagulation.  4. HTN - Her BP is well-controlled at 130/84 during today's visit. Continue current medication regimen.   Medication Adjustments/Labs and Tests Ordered: Current medicines are reviewed at length with the patient today.  Concerns regarding medicines are outlined above.  Medication changes, Labs and Tests ordered today are listed in the Patient Instructions below. Patient Instructions  Medication Instructions:  Your physician recommends that you continue on your current medications as directed. Please refer to the Current Medication list given to you today.  *If you need a refill on your cardiac medications before your next appointment, please call your pharmacy*   Lab Work: None If you have labs (blood work) drawn today and your tests are completely normal, you will receive your results only by: [place] (if you have MyChart) OR A paper copy in the mail If you have any lab test that is abnormal or we need to change your treatment, we will call you to review the results.   Testing/Procedures: None   Follow-Up: At [facility], you and your health needs are our priority.  As part of our continuing mission to provide you with exceptional heart care, we have created designated Provider Care Teams.  These Care Teams include your primary Cardiologist (physician) and Advanced Practice Providers (APPs -  Physician Assistants and Nurse Practitioners) who all work together to provide you with the care you need, when you need  it.  We recommend signing up for the patient portal called "MyChart".  Sign up  information is provided on this After Visit Summary.  MyChart is used to connect with patients for Virtual Visits (Telemedicine).  Patients are able to view lab/test results, encounter notes, upcoming appointments, etc.  Non-urgent messages can be sent to your provider as well.   To learn more about what you can do with MyChart, go to [email or web address].    Your next appointment:   3 month(s)  The format for your next appointment:   In Person  Provider:   You may see Rozann Lesches, MD or one of the following Advanced Practice Providers on your designated Care Team:   Bernerd Pho, PA-C     Other Instructions     Signed, Waynetta Pean  12/01/2020 3:23 PM    [place]. [address] [place], [place] [address] Phone: [phone] Fax: [phone]

## 2020-12-01 ENCOUNTER — Encounter: Payer: Self-pay | Admitting: Student

## 2020-12-01 ENCOUNTER — Other Ambulatory Visit: Payer: Self-pay

## 2020-12-01 ENCOUNTER — Ambulatory Visit (INDEPENDENT_AMBULATORY_CARE_PROVIDER_SITE_OTHER): Payer: BC Managed Care – PPO | Admitting: Student

## 2020-12-01 ENCOUNTER — Other Ambulatory Visit: Payer: Self-pay | Admitting: Internal Medicine

## 2020-12-01 VITALS — BP 130/84 | HR 74 | Ht 62.0 in | Wt 284.0 lb

## 2020-12-01 DIAGNOSIS — Z8679 Personal history of other diseases of the circulatory system: Secondary | ICD-10-CM

## 2020-12-01 DIAGNOSIS — I10 Essential (primary) hypertension: Secondary | ICD-10-CM

## 2020-12-01 DIAGNOSIS — I480 Paroxysmal atrial fibrillation: Secondary | ICD-10-CM | POA: Diagnosis not present

## 2020-12-01 NOTE — Patient Instructions (Signed)
Medication Instructions:  Your physician recommends that you continue on your current medications as directed. Please refer to the Current Medication list given to you today.  *If you need a refill on your cardiac medications before your next appointment, please call your pharmacy*   Lab Work: None If you have labs (blood work) drawn today and your tests are completely normal, you will receive your results only by: [place] (if you have MyChart) OR A paper copy in the mail If you have any lab test that is abnormal or we need to change your treatment, we will call you to review the results.   Testing/Procedures: None   Follow-Up: At [facility], you and your health needs are our priority.  As part of our continuing mission to provide you with exceptional heart care, we have created designated Provider Care Teams.  These Care Teams include your primary Cardiologist (physician) and Advanced Practice Providers (APPs -  Physician Assistants and Nurse Practitioners) who all work together to provide you with the care you need, when you need it.  We recommend signing up for the patient portal called "MyChart".  Sign up information is provided on this After Visit Summary.  MyChart is used to connect with patients for Virtual Visits (Telemedicine).  Patients are able to view lab/test results, encounter notes, upcoming appointments, etc.  Non-urgent messages can be sent to your provider as well.   To learn more about what you can do with MyChart, go to [email or web address].    Your next appointment:   3 month(s)  The format for your next appointment:   In Person  Provider:   You may see Rozann Lesches, MD or one of the following Advanced Practice Providers on your designated Care Team:   Bernerd Pho, [place]     Other Instructions

## 2020-12-07 ENCOUNTER — Other Ambulatory Visit: Payer: Self-pay | Admitting: *Deleted

## 2020-12-07 ENCOUNTER — Telehealth: Payer: Self-pay

## 2020-12-07 DIAGNOSIS — K219 Gastro-esophageal reflux disease without esophagitis: Secondary | ICD-10-CM

## 2020-12-07 MED ORDER — APIXABAN 5 MG PO TABS: 5.0000 mg | ORAL_TABLET | Freq: Two times a day (BID) | ORAL | 3 refills | Status: DC

## 2020-12-07 MED ORDER — PANTOPRAZOLE SODIUM 40 MG PO TBEC: 40.0000 mg | DELAYED_RELEASE_TABLET | Freq: Two times a day (BID) | ORAL | 0 refills | Status: DC

## 2020-12-07 NOTE — Telephone Encounter (Signed)
Patient needs refills on protonix and eliquis ph# [phone]

## 2020-12-07 NOTE — Telephone Encounter (Signed)
Medication has been sent to pharmacy.  °

## 2020-12-14 ENCOUNTER — Encounter (HOSPITAL_COMMUNITY): Payer: BC Managed Care – PPO

## 2020-12-17 ENCOUNTER — Encounter: Payer: BC Managed Care – PPO | Admitting: Internal Medicine

## 2020-12-20 ENCOUNTER — Other Ambulatory Visit: Payer: Self-pay | Admitting: Internal Medicine

## 2020-12-20 DIAGNOSIS — K219 Gastro-esophageal reflux disease without esophagitis: Secondary | ICD-10-CM

## 2020-12-22 ENCOUNTER — Other Ambulatory Visit: Payer: Self-pay | Admitting: *Deleted

## 2020-12-22 MED ORDER — TOPIRAMATE 50 MG PO TABS: 50.0000 mg | ORAL_TABLET | Freq: Every day | ORAL | 0 refills | Status: DC

## 2020-12-23 ENCOUNTER — Other Ambulatory Visit: Payer: Self-pay | Admitting: Internal Medicine

## 2020-12-29 ENCOUNTER — Encounter (HOSPITAL_COMMUNITY): Payer: BC Managed Care – PPO

## 2021-01-07 ENCOUNTER — Other Ambulatory Visit (HOSPITAL_COMMUNITY): Payer: Self-pay | Admitting: Emergency Medicine

## 2021-01-07 DIAGNOSIS — Z01812 Encounter for preprocedural laboratory examination: Secondary | ICD-10-CM

## 2021-01-07 NOTE — Progress Notes (Signed)
CBC ordered for HCT value needed for CMR ECV calculation  Marchia Bond RN Navigator Cardiac Imaging [facility] [phone] Office  [phone] Cell

## 2021-01-08 ENCOUNTER — Telehealth (HOSPITAL_COMMUNITY): Payer: Self-pay | Admitting: Emergency Medicine

## 2021-01-08 NOTE — Telephone Encounter (Signed)
Attempted to call patient regarding upcoming cardiac MR appointment. Left message on voicemail with name and callback number Marchia Bond RN Navigator Cardiac Imaging [facility] [phone] Office [phone] Cell  Requested she have CBC drawn for HCT value at [facility]

## 2021-01-11 ENCOUNTER — Ambulatory Visit (HOSPITAL_COMMUNITY)
Admission: RE | Admit: 2021-01-11 | Discharge: 2021-01-11 | Disposition: A | Discharge status: Home / Self Care | Payer: BC Managed Care – PPO | Source: Ambulatory Visit | Attending: Internal Medicine | Admitting: Internal Medicine

## 2021-01-11 ENCOUNTER — Ambulatory Visit (INDEPENDENT_AMBULATORY_CARE_PROVIDER_SITE_OTHER): Payer: BC Managed Care – PPO | Admitting: Internal Medicine

## 2021-01-11 ENCOUNTER — Other Ambulatory Visit: Payer: Self-pay

## 2021-01-11 ENCOUNTER — Other Ambulatory Visit (HOSPITAL_COMMUNITY)
Admission: RE | Admit: 2021-01-11 | Discharge: 2021-01-11 | Disposition: A | Discharge status: Home / Self Care | Payer: BC Managed Care – PPO | Source: Ambulatory Visit | Attending: Student | Admitting: Student

## 2021-01-11 ENCOUNTER — Encounter: Payer: Self-pay | Admitting: Internal Medicine

## 2021-01-11 ENCOUNTER — Other Ambulatory Visit (HOSPITAL_COMMUNITY)
Admission: RE | Admit: 2021-01-11 | Discharge: 2021-01-11 | Disposition: A | Discharge status: Home / Self Care | Payer: BC Managed Care – PPO | Source: Ambulatory Visit | Attending: Internal Medicine | Admitting: Internal Medicine

## 2021-01-11 VITALS — BP 134/83 | HR 100 | Temp 98.2°F | Resp 18 | Ht 62.0 in | Wt 282.1 lb

## 2021-01-11 DIAGNOSIS — I480 Paroxysmal atrial fibrillation: Secondary | ICD-10-CM

## 2021-01-11 DIAGNOSIS — Z79899 Other long term (current) drug therapy: Secondary | ICD-10-CM | POA: Insufficient documentation

## 2021-01-11 DIAGNOSIS — R42 Dizziness and giddiness: Secondary | ICD-10-CM

## 2021-01-11 DIAGNOSIS — I10 Essential (primary) hypertension: Secondary | ICD-10-CM

## 2021-01-11 DIAGNOSIS — R200 Anesthesia of skin: Secondary | ICD-10-CM

## 2021-01-11 DIAGNOSIS — M5416 Radiculopathy, lumbar region: Secondary | ICD-10-CM | POA: Insufficient documentation

## 2021-01-11 DIAGNOSIS — I409 Acute myocarditis, unspecified: Secondary | ICD-10-CM | POA: Diagnosis not present

## 2021-01-11 DIAGNOSIS — Z01812 Encounter for preprocedural laboratory examination: Secondary | ICD-10-CM | POA: Insufficient documentation

## 2021-01-11 DIAGNOSIS — G43709 Chronic migraine without aura, not intractable, without status migrainosus: Secondary | ICD-10-CM

## 2021-01-11 DIAGNOSIS — M545 Low back pain, unspecified: Secondary | ICD-10-CM | POA: Diagnosis not present

## 2021-01-11 LAB — BASIC METABOLIC PANEL
Anion gap: 8 (ref 5–15)
BUN: 22 mg/dL (ref 8–23)
CO2: 25 mmol/L (ref 22–32)
Calcium: 9.6 mg/dL (ref 8.9–10.3)
Chloride: 104 mmol/L (ref 98–111)
Creatinine, Ser: 1.04 mg/dL — ABNORMAL HIGH (ref 0.44–1.00)
GFR, Estimated: >60 mL/min (ref >60)
Glucose, Bld: 141 mg/dL — ABNORMAL HIGH (ref 70–99)
Potassium: 4.3 mmol/L (ref 3.5–5.1)
Sodium: 137 mmol/L (ref 135–145)

## 2021-01-11 LAB — CBC
HCT: 43.7 % (ref 36.0–46.0)
Hemoglobin: 12.8 g/dL (ref 12.0–15.0)
MCH: 21.9 pg — ABNORMAL LOW (ref 26.0–34.0)
MCHC: 29.3 g/dL — ABNORMAL LOW (ref 30.0–36.0)
MCV: 74.8 fL — ABNORMAL LOW (ref 80.0–100.0)
Platelets: 382 10*3/uL (ref 150–400)
RBC: 5.84 MIL/uL — ABNORMAL HIGH (ref 3.87–5.11)
RDW: 15.4 % (ref 11.5–15.5)
WBC: 10.6 10*3/uL — ABNORMAL HIGH (ref 4.0–10.5)
nRBC: 0 % (ref 0.0–0.2)

## 2021-01-11 MED ORDER — MECLIZINE HCL 25 MG PO TABS: 25.0000 mg | ORAL_TABLET | Freq: Three times a day (TID) | ORAL | 0 refills | Status: AC | PRN

## 2021-01-11 NOTE — Patient Instructions (Addendum)
Please continue to take medications as prescribed.  Please avoid sudden positional changes to avoid vertigo. Maintain adequate hydration and avoid skipping any meals.  Okay to take Tylenol 650 mg for back pain, up to 3 times in a day.  Avoid frequent bending and prolonged standing. Okay to use back brace to help with low back pain.  Please get X-ray of lumbar spine done at [facility].  Please start taking Caltrate 600 plus Vitamin D for bone health and numbness around mouth.

## 2021-01-11 NOTE — Assessment & Plan Note (Signed)
In 05/2020, thought to be likely viral On Entresto and Jardiance now Followed by Cardiology

## 2021-01-11 NOTE — Assessment & Plan Note (Signed)
Currently in sinus rhythm On Toprol and Eliquis Follows up with Cardiologist 

## 2021-01-11 NOTE — Progress Notes (Signed)
Established Patient Office Visit  Subjective:  Patient ID: Monique Gomez, female    DOB: [birth year]  Age: 65 y.o. MRN: [id number]  CC:  Chief Complaint  Patient presents with   Follow-up    Follow up pt is having back pain for about 2 months this has gotten worse its in her lower back hurts when she walks she also has been having tongue swelling and facial swelling she does see ENT and her nasal passages have been swollen for awhile     HPI Monique Gomez is a 65 year old female with past medical history of paroxysmal atrial fibrillation, hypertension, myocarditis, OSA on CPAP, GERD, DM, HLD and morbid obesity who presents for follow up of her chronic medical conditions.  HTN: BP is well-controlled. Takes medications regularly. Patient denies headache, dizziness, chest pain, dyspnea or palpitations.  Myocarditis: She is followed by cardiology, currently takes [place], Jardiance and Metoprolol.  She denies any dyspnea currently.  Chronic low back pain: She complains of low back pain, chronic, sharp, radiating to her left LE and is worse with movement.  She denies any tingling or weakness of the LE.  Denies any recent fall or injury.  Denies any heavy lifting recently.  Denies any spine surgery in the past.  She complains of tingling around the mouth, which is intermittent.  She also reports vertigo at times.  She has history of chronic rhinitis and DNS, for which she follows up with Dr. Benjamine Mola.  Denies any fever, chills or sore throat currently.    Past Medical History:  Diagnosis Date   Abdominal fibromatosis    ACS (acute coronary syndrome) ([place])    Acute viral myocarditis 09/22/2020   Allergic rhinitis    Anemia    Asthma    Essential hypertension    GERD (gastroesophageal reflux disease)    Headache(784.0)    History of cardiac catheterization    No significant CAD May 2015   History of migraine headaches    Hyperlipidemia    Migraines    Myocarditis ([place])    a.  diagnosed in 05/2020 with cath showing normal cors   Obesity    PAF (paroxysmal atrial fibrillation) ([place]) 08/2013   Sleep apnea    Type 2 diabetes mellitus [facility])     Past Surgical History:  Procedure Laterality Date   CATARACT EXTRACTION     CATARACT EXTRACTION W/PHACO Right 04/05/2019   Procedure: CATARACT EXTRACTION PHACO AND INTRAOCULAR LENS PLACEMENT RIGHT EYE (CDE: 3.19);  Surgeon: Baruch Goldmann, MD;  Location: [facility];  Service: Ophthalmology;  Laterality: Right;   COLONOSCOPY N/A 02/02/2015   SLF: 1. one colon polyp removed-no source for anemia identified. 2. moderate diverticulosis noted in the sigmoid colon and descending colon 3. the left colon is redundant 4. Rectal bleeding due ot small internal hemorroids 5. Moderate sized external hemorrhoids.   ESOPHAGOGASTRODUODENOSCOPY N/A 02/02/2015   SLF: 1. 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 large hiatal hernia 3. mild non-erosive gastritis 4. No source for anemia identified.    LEFT HEART CATH AND CORONARY ANGIOGRAPHY N/A 05/14/2020   Procedure: LEFT HEART CATH AND CORONARY ANGIOGRAPHY;  Surgeon: Belva Crome, MD;  Location: [place] [facility];  Service: Cardiovascular;  Laterality: N/A;   LEFT HEART CATHETERIZATION WITH CORONARY ANGIOGRAM N/A 09/03/2013   Procedure: LEFT HEART CATHETERIZATION WITH CORONARY ANGIOGRAM;  Surgeon: Jettie Booze, MD;  Location: [facility] C[facility];  Service: Cardiovascular;  Laterality: N/A;   Right carpal tunnel release  Family History  Problem Relation Age of Onset   Diabetes Mother    Hypertension Mother    Heart disease Mother    High Cholesterol Mother    Alzheimer's disease Father    Hypertension Sister    Hypertension Brother    Diabetes Brother    Arthritis Sister    Hypertension Sister    High Cholesterol Sister    Diabetes Son    Developmental delay Son    Colon cancer Neg Hx     Social History   Socioeconomic History   Marital status: Single    Spouse name: Not  on file   Number of children: 2   Years of education: Not on file   Highest education level: Not on file  Occupational History   Occupation: Education officer, museum    Employer: [employer]  Tobacco Use   Smoking status: Former    Packs/day: 0.30    Types: Cigarettes    Quit date: 04/01/2012    Years since quitting: 8.7   Smokeless tobacco: Never  Vaping Use   Vaping Use: Never used  Substance and Sexual Activity   Alcohol use: Not Currently    Alcohol/week: 0.0 standard drinks    Comment: very occasional   Drug use: No   Sexual activity: Yes  Other Topics Concern   Not on file  Social History Narrative   Not on file   Social Determinants of Health   Financial Resource Strain: Not on file  Food Insecurity: Not on file  Transportation Needs: Not on file  Physical Activity: Not on file  Stress: Not on file  Social Connections: Not on file  Intimate Partner Violence: Not on file    Outpatient Medications Prior to Visit  Medication Sig Dispense Refill   acetaminophen (TYLENOL) 500 MG tablet Take 1 tablet (500 mg total) by mouth every 6 (six) hours as needed. 30 tablet 0   apixaban (ELIQUIS) 5 MG TABS tablet Take 1 tablet (5 mg total) by mouth 2 (two) times daily. 60 tablet 3   atorvastatin (LIPITOR) 80 MG tablet Take 1 tablet by mouth once daily 90 tablet 0   empagliflozin (JARDIANCE) 10 MG TABS tablet Take 1 tablet (10 mg total) by mouth daily before breakfast. 30 tablet 6   fluticasone (FLONASE) 50 MCG/ACT nasal spray Place 2 sprays into both nostrils daily.     Galcanezumab-gnlm (EMGALITY) 120 MG/ML SOAJ Inject 120 mg into the skin every 30 (thirty) days. 1.12 mL 11   ipratropium (ATROVENT) 0.03 % nasal spray Place 2 sprays into both nostrils every 12 (twelve) hours. 30 mL 3   Iron-FA-B Cmp-C-Biot-Probiotic (FUSION PLUS) CAPS Take 2 tablets by mouth daily. 60 capsule 3   metFORMIN (GLUCOPHAGE) 500 MG tablet TAKE 1 TABLET BY MOUTH TWICE DAILY WITH A MEAL 180 tablet 0    metoCLOPramide (REGLAN) 10 MG tablet Take 1 tablet (10 mg total) by mouth every 8 (eight) hours as needed for up to 7 days for nausea (Headache). 21 tablet 0   pantoprazole (PROTONIX) 40 MG tablet Take 1 tablet by mouth twice daily 180 tablet 0   sacubitril-valsartan (ENTRESTO) 97-103 MG Take 1 tablet by mouth 2 (two) times daily. 180 tablet 3   topiramate (TOPAMAX) 50 MG tablet Take 1 tablet (50 mg total) by mouth at bedtime. 90 tablet 0   Ubrogepant (UBRELVY) 50 MG TABS Take 1 tablet at the onset of a migraine.  Can repeat in 2 hours if needed. 15 tablet 5  metoprolol succinate (TOPROL-XL) 50 MG 24 hr tablet Take 1 tablet (50 mg total) by mouth in the morning and at bedtime. Take with or immediately following a meal. 180 tablet 3   No facility-administered medications prior to visit.    Allergies  Allergen Reactions   Venlafaxine Nausea And Vomiting and Other (See Comments)    Dizziness, shakiness    ROS Review of Systems  Constitutional:  Negative for chills and fever.  HENT:  Positive for congestion and sinus pain. Negative for sore throat.   Eyes:  Negative for pain and discharge.  Respiratory:  Negative for cough and shortness of breath.   Cardiovascular:  Negative for chest pain and palpitations.  Gastrointestinal:  Negative for abdominal pain, diarrhea, nausea and vomiting.  Endocrine: Negative for polydipsia and polyuria.  Genitourinary:  Negative for dysuria and hematuria.  Musculoskeletal:  Positive for back pain. Negative for neck pain and neck stiffness.  Skin:  Negative for rash.  Neurological:  Positive for dizziness. Negative for weakness.  Psychiatric/Behavioral:  Negative for agitation and behavioral problems.      Objective:    Physical Exam Vitals reviewed.  Constitutional:      General: She is not in acute distress.    Appearance: She is not diaphoretic.  HENT:     Head: Normocephalic and atraumatic.     Nose: Nose normal.     Mouth/Throat:     Mouth:  Mucous membranes are moist.  Eyes:     General: No scleral icterus.    Extraocular Movements: Extraocular movements intact.  Cardiovascular:     Rate and Rhythm: Normal rate and regular rhythm.     Pulses: Normal pulses.     Heart sounds: Normal heart sounds. No murmur heard. Pulmonary:     Breath sounds: Normal breath sounds. No wheezing or rales.  Abdominal:     Palpations: Abdomen is soft.     Tenderness: There is no abdominal tenderness.  Musculoskeletal:     Cervical back: Neck supple. No tenderness.     Right lower leg: No edema.     Left lower leg: No edema.  Skin:    General: Skin is warm.     Findings: No rash.  Neurological:     General: No focal deficit present.     Mental Status: She is alert and oriented to person, place, and time.  Psychiatric:        Mood and Affect: Mood normal.        Behavior: Behavior normal.    BP 134/83 (BP Location: Left Arm, Patient Position: Sitting, Cuff Size: Normal)   Pulse 100   Temp 98.2 F (36.8 C) (Oral)   Resp 18   Ht '5\' 2"'$  (1.575 m)   Wt 282 lb 1.9 oz (128 kg)   SpO2 97%   BMI 51.60 kg/m  Wt Readings from Last 3 Encounters:  01/11/21 282 lb 1.9 oz (128 kg)  12/01/20 284 lb (128.8 kg)  10/21/20 284 lb (128.8 kg)     Health Maintenance Due  Topic Date Due   Zoster Vaccines- Shingrix (1 of 2) Never done   Pneumococcal Vaccine 26-41 Years old (2 - PCV) 12/07/2014   COVID-19 Vaccine (3 - Moderna risk series) 08/20/2019   HEMOGLOBIN A1C  11/10/2020   INFLUENZA VACCINE  11/30/2020   FOOT EXAM  12/17/2020    There are no preventive care reminders to display for this patient.  Lab Results  Component Value Date   TSH 3.035  05/13/2020   Lab Results  Component Value Date   WBC 9.2 08/28/2020   HGB 12.0 08/28/2020   HCT 39.8 08/28/2020   MCV 73.6 (L) 08/28/2020   PLT 309 08/28/2020   Lab Results  Component Value Date   NA 139 09/24/2020   K 3.8 09/24/2020   CO2 25 09/24/2020   GLUCOSE 137 (H) 09/24/2020    BUN 18 09/24/2020   CREATININE 0.93 09/24/2020   BILITOT 0.4 07/09/2020   ALKPHOS 70 07/09/2020   AST 24 07/09/2020   ALT 39 07/09/2020   PROT 7.4 07/09/2020   ALBUMIN 3.8 07/09/2020   CALCIUM 9.4 09/24/2020   ANIONGAP 9 09/24/2020   Lab Results  Component Value Date   CHOL 162 05/13/2020   Lab Results  Component Value Date   HDL 24 (L) 05/13/2020   Lab Results  Component Value Date   LDLCALC 107 (H) 05/13/2020   Lab Results  Component Value Date   TRIG 156 (H) 05/13/2020   Lab Results  Component Value Date   CHOLHDL 6.8 05/13/2020   Lab Results  Component Value Date   HGBA1C 6.4 (H) 05/13/2020      Assessment & Plan:   Problem List Items Addressed This Visit       Cardiovascular and Mediastinum   Migraine    On Emgality and Topamax Ubrelvy PRN Follows up with Neurology      HTN (hypertension)    BP Readings from Last 1 Encounters:  01/11/21 134/83  Well-controlled with Entresto and Metoprolol Counseled for compliance with the medications Advised DASH diet and moderate exercise/walking, at least 150 mins/week       PAF (paroxysmal atrial fibrillation) ([place])    Currently in sinus rhythm On Toprol and Eliquis Follows up with Cardiologist      Myocarditis ([place])    In 05/2020, thought to be likely viral On Entresto and Jardiance now Followed by Cardiology        Nervous and Auditory   Lumbar radiculopathy - Primary    Chronic low back pain Weight and activities could be contributing Check X-ray lumbar spine Offered PT, wants wait for now      Relevant Orders   DG Lumbar Spine Complete   Other Visit Diagnoses     Vertigo     Maintain adequate hydration and avoid skipping any meals. Meclizine PRN   Relevant Medications   meclizine (ANTIVERT) 25 MG tablet   Perioral numbness     Unclear etiology Trial of calcium supplement as hypocalcemia can cause perioral numbness/weakness, last BMP reviewed F/u ENT       Meds ordered this  encounter  Medications   meclizine (ANTIVERT) 25 MG tablet    Sig: Take 1 tablet (25 mg total) by mouth 3 (three) times daily as needed for dizziness.    Dispense:  30 tablet    Refill:  0    Follow-up: Return in about 4 months (around 05/13/2021) for Annual physical.    Lindell Spar, MD

## 2021-01-11 NOTE — Assessment & Plan Note (Signed)
BP Readings from Last 1 Encounters:  01/11/21 134/83   Well-controlled with Entresto and Metoprolol Counseled for compliance with the medications Advised DASH diet and moderate exercise/walking, at least 150 mins/week

## 2021-01-11 NOTE — Assessment & Plan Note (Signed)
Chronic low back pain Weight and activities could be contributing Check X-ray lumbar spine Offered PT, wants wait for now

## 2021-01-11 NOTE — Assessment & Plan Note (Signed)
On Emgality and Topamax Ubrelvy PRN Follows up with Neurology 

## 2021-01-12 ENCOUNTER — Ambulatory Visit (HOSPITAL_COMMUNITY)
Admission: RE | Admit: 2021-01-12 | Discharge: 2021-01-12 | Disposition: A | Discharge status: Home / Self Care | Payer: BC Managed Care – PPO | Source: Ambulatory Visit | Attending: Internal Medicine | Admitting: Internal Medicine

## 2021-01-12 DIAGNOSIS — I409 Acute myocarditis, unspecified: Secondary | ICD-10-CM | POA: Insufficient documentation

## 2021-01-12 MED ORDER — GADOBUTROL 1 MMOL/ML IV SOLN
10.0000 mL | Freq: Once | INTRAVENOUS | Status: AC | PRN
  Administered 2021-01-12: 10 mL via INTRAVENOUS

## 2021-01-13 ENCOUNTER — Inpatient Hospital Stay (HOSPITAL_COMMUNITY): Payer: BC Managed Care – PPO | Attending: Hematology

## 2021-01-13 ENCOUNTER — Other Ambulatory Visit: Payer: Self-pay

## 2021-01-13 DIAGNOSIS — D509 Iron deficiency anemia, unspecified: Secondary | ICD-10-CM | POA: Insufficient documentation

## 2021-01-13 DIAGNOSIS — R591 Generalized enlarged lymph nodes: Secondary | ICD-10-CM

## 2021-01-13 LAB — CBC WITH DIFFERENTIAL/PLATELET
Abs Immature Granulocytes: 0.03 10*3/uL (ref 0.00–0.07)
Basophils Absolute: 0.1 10*3/uL (ref 0.0–0.1)
Basophils Relative: 1 %
Eosinophils Absolute: 0.2 10*3/uL (ref 0.0–0.5)
Eosinophils Relative: 2 %
HCT: 42.7 % (ref 36.0–46.0)
Hemoglobin: 12.6 g/dL (ref 12.0–15.0)
Immature Granulocytes: 0 %
Lymphocytes Relative: 29 %
Lymphs Abs: 2.9 10*3/uL (ref 0.7–4.0)
MCH: 22.2 pg — ABNORMAL LOW (ref 26.0–34.0)
MCHC: 29.5 g/dL — ABNORMAL LOW (ref 30.0–36.0)
MCV: 75.2 fL — ABNORMAL LOW (ref 80.0–100.0)
Monocytes Absolute: 0.8 10*3/uL (ref 0.1–1.0)
Monocytes Relative: 8 %
Neutro Abs: 6 10*3/uL (ref 1.7–7.7)
Neutrophils Relative %: 60 %
Platelets: 352 10*3/uL (ref 150–400)
RBC: 5.68 MIL/uL — ABNORMAL HIGH (ref 3.87–5.11)
RDW: 15.3 % (ref 11.5–15.5)
WBC: 10 10*3/uL (ref 4.0–10.5)
nRBC: 0 % (ref 0.0–0.2)

## 2021-01-13 LAB — FERRITIN: Ferritin: 8 ng/mL — ABNORMAL LOW (ref 11–307)

## 2021-01-13 LAB — IRON AND TIBC
Iron: 44 ug/dL (ref 28–170)
Saturation Ratios: 11 % (ref 10.4–31.8)
TIBC: 406 ug/dL (ref 250–450)
UIBC: 362 ug/dL

## 2021-01-14 ENCOUNTER — Other Ambulatory Visit: Payer: Self-pay | Admitting: *Deleted

## 2021-01-14 DIAGNOSIS — M5416 Radiculopathy, lumbar region: Secondary | ICD-10-CM

## 2021-01-19 NOTE — Progress Notes (Deleted)
[place], [place] [address]   CLINIC:  Medical Oncology/Hematology  PCP:  Lindell Spar, MD [address] [place] [address] [phone]   REASON FOR VISIT:  Follow-up for lymphadenopathy and iron deficiency anemia  PRIOR THERAPY: None  CURRENT THERAPY: Intermittent Feraheme (last on 05/17/2020), iron tablet daily  INTERVAL HISTORY:  Monique Gomez 65 y.o. female returns for routine follow-up of lymphadenopathy and iron deficiency anemia.  She was last seen by Dr. Delton Coombes on 07/16/2020.  At today's visit, she reports feeling ***.  No recent hospitalizations, surgeries, or changes in baseline health status.  IRON DEFICIENCY ANEMIA *** Taking iron tablet daily? *** Bleeding? *** Fatigue *** Pica *** Chest pain, DOE, syncope/dizziness  LYMPHADENOPATHY *** Swelling or bumps on neck?  Any other lumps or bumps?  (New adenopathy) *** Recent infections? *** Fever/chills, night sweats, unexplained weight loss  She has ***% energy and ***% appetite. She endorses that she is maintaining a stable weight.    REVIEW OF SYSTEMS:  Review of Systems - Oncology    PAST MEDICAL/SURGICAL HISTORY:  Past Medical History:  Diagnosis Date   Abdominal fibromatosis    ACS (acute coronary syndrome) ([place])    Acute viral myocarditis 09/22/2020   Allergic rhinitis    Anemia    Asthma    Essential hypertension    GERD (gastroesophageal reflux disease)    Headache(784.0)    History of cardiac catheterization    No significant CAD May 2015   History of migraine headaches    Hyperlipidemia    Migraines    Myocarditis ([place])    a. diagnosed in 05/2020 with cath showing normal cors   Obesity    PAF (paroxysmal atrial fibrillation) ([place]) 08/2013   Sleep apnea    Type 2 diabetes mellitus [facility])    Past Surgical History:  Procedure Laterality Date   CATARACT EXTRACTION     CATARACT EXTRACTION W/PHACO Right 04/05/2019   Procedure: CATARACT  EXTRACTION PHACO AND INTRAOCULAR LENS PLACEMENT RIGHT EYE (CDE: 3.19);  Surgeon: Baruch Goldmann, MD;  Location: [facility];  Service: Ophthalmology;  Laterality: Right;   COLONOSCOPY N/A 02/02/2015   SLF: 1. one colon polyp removed-no source for anemia identified. 2. moderate diverticulosis noted in the sigmoid colon and descending colon 3. the left colon is redundant 4. Rectal bleeding due ot small internal hemorroids 5. Moderate sized external hemorrhoids.   ESOPHAGOGASTRODUODENOSCOPY N/A 02/02/2015   SLF: 1. 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 large hiatal hernia 3. mild non-erosive gastritis 4. No source for anemia identified.    LEFT HEART CATH AND CORONARY ANGIOGRAPHY N/A 05/14/2020   Procedure: LEFT HEART CATH AND CORONARY ANGIOGRAPHY;  Surgeon: Belva Crome, MD;  Location: [place] [facility];  Service: Cardiovascular;  Laterality: N/A;   LEFT HEART CATHETERIZATION WITH CORONARY ANGIOGRAM N/A 09/03/2013   Procedure: LEFT HEART CATHETERIZATION WITH CORONARY ANGIOGRAM;  Surgeon: Jettie Booze, MD;  Location: [facility] CATH LAB;  Service: Cardiovascular;  Laterality: N/A;   Right carpal tunnel release       SOCIAL HISTORY:  Social History   Socioeconomic History   Marital status: Single    Spouse name: Not on file   Number of children: 2   Years of education: Not on file   Highest education level: Not on file  Occupational History   Occupation: Social worker    Employer: [employer]  Tobacco Use   Smoking status: Former  Packs/day: 0.30    Types: Cigarettes    Quit date: 04/01/2012    Years since quitting: 8.8   Smokeless tobacco: Never  Vaping Use   Vaping Use: Never used  Substance and Sexual Activity   Alcohol use: Not Currently    Alcohol/week: 0.0 standard drinks    Comment: very occasional   Drug use: No   Sexual activity: Yes  Other Topics Concern   Not on file  Social History Narrative   Not on file   Social Determinants of Health   Financial  Resource Strain: Not on file  Food Insecurity: Not on file  Transportation Needs: Not on file  Physical Activity: Not on file  Stress: Not on file  Social Connections: Not on file  Intimate Partner Violence: Not on file    FAMILY HISTORY:  Family History  Problem Relation Age of Onset   Diabetes Mother    Hypertension Mother    Heart disease Mother    High Cholesterol Mother    Alzheimer's disease Father    Hypertension Sister    Hypertension Brother    Diabetes Brother    Arthritis Sister    Hypertension Sister    High Cholesterol Sister    Diabetes Son    Developmental delay Son    Colon cancer Neg Hx     CURRENT MEDICATIONS:  Outpatient Encounter Medications as of 01/20/2021  Medication Sig Note   acetaminophen (TYLENOL) 500 MG tablet Take 1 tablet (500 mg total) by mouth every 6 (six) hours as needed.    apixaban (ELIQUIS) 5 MG TABS tablet Take 1 tablet (5 mg total) by mouth 2 (two) times daily.    atorvastatin (LIPITOR) 80 MG tablet Take 1 tablet by mouth once daily    empagliflozin (JARDIANCE) 10 MG TABS tablet Take 1 tablet (10 mg total) by mouth daily before breakfast.    fluticasone (FLONASE) 50 MCG/ACT nasal spray Place 2 sprays into both nostrils daily.    Galcanezumab-gnlm (EMGALITY) 120 MG/ML SOAJ Inject 120 mg into the skin every 30 (thirty) days. 07/22/2020: Approved Effective from 07/21/2020 through 10/12/2020.   ipratropium (ATROVENT) 0.03 % nasal spray Place 2 sprays into both nostrils every 12 (twelve) hours.    Iron-FA-B Cmp-C-Biot-Probiotic (FUSION PLUS) CAPS Take 2 tablets by mouth daily.    meclizine (ANTIVERT) 25 MG tablet Take 1 tablet (25 mg total) by mouth 3 (three) times daily as needed for dizziness.    metFORMIN (GLUCOPHAGE) 500 MG tablet TAKE 1 TABLET BY MOUTH TWICE DAILY WITH A MEAL    metoCLOPramide (REGLAN) 10 MG tablet Take 1 tablet (10 mg total) by mouth every 8 (eight) hours as needed for up to 7 days for nausea (Headache).    metoprolol  succinate (TOPROL-XL) 50 MG 24 hr tablet Take 1 tablet (50 mg total) by mouth in the morning and at bedtime. Take with or immediately following a meal.    pantoprazole (PROTONIX) 40 MG tablet Take 1 tablet by mouth twice daily    sacubitril-valsartan (ENTRESTO) 97-103 MG Take 1 tablet by mouth 2 (two) times daily.    topiramate (TOPAMAX) 50 MG tablet Take 1 tablet (50 mg total) by mouth at bedtime.    Ubrogepant (UBRELVY) 50 MG TABS Take 1 tablet at the onset of a migraine.  Can repeat in 2 hours if needed.    No facility-administered encounter medications on file as of 01/20/2021.    ALLERGIES:  Allergies  Allergen Reactions   Venlafaxine Nausea And Vomiting  and Other (See Comments)    Dizziness, shakiness     PHYSICAL EXAM:  ECOG PERFORMANCE STATUS: {CHL ONC ECOG PS:[id number]}  There were no vitals filed for this visit. There were no vitals filed for this visit. Physical Exam   LABORATORY DATA:  I have reviewed the labs as listed.  CBC    Component Value Date/Time   WBC 10.0 01/13/2021 0852   RBC 5.68 (H) 01/13/2021 0852   HGB 12.6 01/13/2021 0852   HGB 11.2 07/17/2019 1148   HCT 42.7 01/13/2021 0852   HCT 37.2 07/17/2019 1148   PLT 352 01/13/2021 0852   PLT 352 07/17/2019 1148   MCV 75.2 (L) 01/13/2021 0852   MCV 75 (L) 07/17/2019 1148   MCH 22.2 (L) 01/13/2021 0852   MCHC 29.5 (L) 01/13/2021 0852   RDW 15.3 01/13/2021 0852   RDW 14.7 07/17/2019 1148   LYMPHSABS 2.9 01/13/2021 0852   LYMPHSABS 1.8 07/17/2019 1148   MONOABS 0.8 01/13/2021 0852   EOSABS 0.2 01/13/2021 0852   EOSABS 0.2 07/17/2019 1148   BASOSABS 0.1 01/13/2021 0852   BASOSABS 0.0 07/17/2019 1148   CMP Latest Ref Rng & Units 01/11/2021 09/24/2020 08/28/2020  Glucose 70 - 99 mg/dL 141(H) 137(H) 109(H)  BUN 8 - 23 mg/dL 22 18 17   Creatinine 0.44 - 1.00 mg/dL 1.04(H) 0.93 0.84  Sodium 135 - 145 mmol/L 137 139 140  Potassium 3.5 - 5.1 mmol/L 4.3 3.8 4.3  Chloride 98 - 111 mmol/L 104 105 104  CO2 22  - 32 mmol/L 25 25 28   Calcium 8.9 - 10.3 mg/dL 9.6 9.4 9.7  Total Protein 6.5 - 8.1 g/dL - - -  Total Bilirubin 0.3 - 1.2 mg/dL - - -  Alkaline Phos 38 - 126 U/L - - -  AST 15 - 41 U/L - - -  ALT 0 - 44 U/L - - -    DIAGNOSTIC IMAGING:  I have independently reviewed the relevant imaging and discussed with the patient.  ASSESSMENT & PLAN: 1.  Subcentimeter left supraclavicular adenopathy, waxing and waning - She reported lymphadenopathy since ~September 2020 - Ultrasound soft tissue neck on 01/25/2019 shows left supraclavicular lymph node measuring 1 cm.  A CT of the soft tissue of the neck on the same day showed 3 small supraclavicular lymph nodes, largest measuring 8-9 mm.  Small posterior triangle lymph node, none more than 9 mm.  Consistent with mild reactive adenitis. - CT soft tissue neck with contrast on 02/22/2019 showed right level 2 lymph node measuring 6 mm unchanged.  Subcentimeter posterior triangle lymph nodes on the right unchanged.  Largest measures 8 mm.  Left level 2 lymph node measures 8.6 mm unchanged.  Cluster of supraclavicular lymph nodes on the left also slightly smaller.  Largest measures 5 mm.  No oropharyngeal masses. - CT soft tissue neck on 06/17/2019 shows left level 2 lymph node measuring 1.2 cm, previously 1.1 cm.  Stable cluster of nonenlarged left supraclavicular lymph nodes. - CT angiogram from 05/13/2020 which showed no lymphadenopathy in the base of the neck.  No thoracic adenopathy.  Diffuse normal-sized mediastinal and axillary lymph nodes noted. - Former smoker.  She smoked 1 pack of cigarettes per week for 35 years.  She quit smoking in 2013. - She sees Dr. Benjamine Mola for chronic sinusitis *** - Recent infections or antibiotics *** - She does not report any fevers, night sweats or weight loss in the last 6 months.  *** - Physical examination today did  not reveal any palpable adenopathy in the neck region or axillary region.  *** - Most recent labs *** - Suspect  reactive lymphadenitis/lymphadenopathy, possibly related to chronic sinusitis - PLAN: *** Follow-up in 6 months?  ***  2.  Iron deficiency anemia: - She received Feraheme on 05/17/2020 while she was hospitalized. - She reports taking iron tablet daily for many years.  *** - EGD/colonoscopy (02/02/2015): Nonerosive gastritis, no source for anemia identified on EGD.  Polyp and diverticulosis, rectal bleeding from small internal hemorrhoids. 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 - Bleeding *** - Symptoms *** - Most recent labs (01/13/2021): Normal Hgb 12.6 with MCV 75.2 and elevated RBC 5.60.  Ferritin is low at 8 with iron saturation 11% and TIBC 406 - PLAN: Due to worsening ferritin and symptoms of ***, despite oral iron supplementation, we recommend IV iron therapy with Venofer x3.  Repeat CBC & iron panel and follow-up in 6 months.  We will consider hemoglobin cascade at that time as well.   PLAN SUMMARY & DISPOSITION: ***  All questions were answered. The patient knows to call the clinic with any problems, questions or concerns.  Medical decision making: ***  Time spent on visit: I spent {CHL ONC TIME VISIT - QASTM:[id number]} counseling the patient face to face. The total time spent in the appointment was {CHL ONC TIME VISIT - XQJJH:[id number]} and more than 50% was on counseling.   Harriett Rush, PA-C  ***

## 2021-01-20 ENCOUNTER — Inpatient Hospital Stay (HOSPITAL_COMMUNITY): Payer: BC Managed Care – PPO | Admitting: Physician Assistant

## 2021-01-21 ENCOUNTER — Telehealth (INDEPENDENT_AMBULATORY_CARE_PROVIDER_SITE_OTHER): Payer: BC Managed Care – PPO | Admitting: Adult Health

## 2021-01-21 ENCOUNTER — Telehealth: Payer: Self-pay | Admitting: Neurology

## 2021-01-21 DIAGNOSIS — G43709 Chronic migraine without aura, not intractable, without status migrainosus: Secondary | ICD-10-CM | POA: Diagnosis not present

## 2021-01-21 NOTE — Progress Notes (Signed)
Virtual Visit via Video Note  I connected with Monique Gomez on 01/21/21 at  1:00 PM EDT by a video enabled telemedicine application and verified that I am speaking with the correct person using two identifiers.  Location: Patient: at her home Provider: in the office    I discussed the limitations of evaluation and management by telemedicine and the availability of in person appointments. The patient expressed understanding and agreed to proceed.  History of Present Illness: Monique Gomez is a 65 year old female with a history of migraine headaches.  At the last visit with Butler Denmark she was changed to [facility].  She reports that this is working better.  She states that her migraines have improved drastically.  When she does get a migraine [name] is helpful.  Her blood pressure is also improved since she switched to [employer].  She has been diagnosed with arthritis and her provider advised her to take Tylenol if needed.  Update 07/20/20 SS: Here today for follow-up via virtual visit, currently taking Aimovig 70 mg monthly injection, Topamax 50 mg at bedtime, Ubrelvy as needed.  In January, she was diagnosed with viral myocarditis, has had significant fatigue, shortness of breath.  She has been out of work since January.  Her headaches were well controlled up until about a month ago, started having recurrence of her typical migraine headaches.  Taking daily Tylenol or Ubrelvy.  The headaches will initially improved, then will return a few hours later.  Feels benefit of Aimovig has worn off. Migraines rotate frontal area, occipital, can localize behind the left eye.  Has migraine features with significant headaches.  Update 01/21/2020 MM: Monique Gomez is a 65 year old female with a history of migraine headaches.  She returns today for follow-up.  She is currently taking 70 mg of Aimovig, Topamax 50 mg daily.  She uses sumatriptan for her migraine headaches.  She reports that starting several  months back she began to have daily headaches.  She describes it as a pressure sensation associated with some vertigo as well.  She did go see her ENT as she thought it was sinus related.  She reports that her exam with them was relatively unremarkable.  They did give her some nasal spray to use however they felt that her symptoms was related to her headaches.  The patient states that the location of her headache varies.  With some headache she does have photophobia.  Reports that she has been taking Tylenol daily until she spoke to the nurse at our office last week.  She states that she has tried to decrease her usage since then.  Reports that she does use sumatriptan on occasion and reports that it does not always offer her good benefit.  She returns today for an evaluation.   She has tried sumatriptan, Maxalt, diclofenac and Fioricet as abortive therapies   Observations/Objective: Via virtual, is alert and oriented, speech is clear and concise, facial symmetry noted, follows commands, moves extremities well, gait appears steady and intact  Assessment and Plan: 1.  Chronic migraine headaches  -Increase in migraines the last 1 month, feels Aimovig benefit has worn off -Continue Emgality monthly injections -Continue Topamax50 mg at bedtime -Continue Ubrelvy as needed for acute headache -Follow-up in 6 months or sooner if needed  Follow Up Instructions: FU 1 year or sooner if needed   I discussed the assessment and treatment plan with the patient. The patient was provided an opportunity to ask questions and all were answered.  The patient agreed with the plan and demonstrated an understanding of the instructions.    I spent 15 minutes of face-to-face and non-face-to-face time with patient.  This included previsit chart review, lab review, study review, order entry, electronic health record documentation, patient education.  Ward Givens NP-C [facility] [address],  [place], [place] [address] [phone]

## 2021-01-21 NOTE — Telephone Encounter (Signed)
..   Pt understands that although there may be some limitations with this type of visit, we will take all precautions to reduce any security or privacy concerns.  Pt understands that this will be treated like an in office visit and we will file with pt's insurance, and there may be a patient responsible charge related to this service. ? ?

## 2021-02-01 ENCOUNTER — Ambulatory Visit (HOSPITAL_COMMUNITY): Payer: BC Managed Care – PPO

## 2021-02-03 ENCOUNTER — Telehealth: Payer: Self-pay | Admitting: Student

## 2021-02-03 NOTE — Telephone Encounter (Signed)
Calling for MRI results.

## 2021-02-03 NOTE — Telephone Encounter (Signed)
Returned pt call. No answer. Left msg to call back.  

## 2021-02-04 ENCOUNTER — Encounter: Payer: Self-pay | Admitting: Orthopedic Surgery

## 2021-02-04 ENCOUNTER — Other Ambulatory Visit (HOSPITAL_COMMUNITY): Payer: Self-pay | Admitting: Internal Medicine

## 2021-02-04 ENCOUNTER — Telehealth (HOSPITAL_COMMUNITY): Payer: Self-pay | Admitting: Cardiology

## 2021-02-04 ENCOUNTER — Ambulatory Visit (INDEPENDENT_AMBULATORY_CARE_PROVIDER_SITE_OTHER): Payer: BC Managed Care – PPO | Admitting: Orthopedic Surgery

## 2021-02-04 ENCOUNTER — Other Ambulatory Visit: Payer: Self-pay

## 2021-02-04 VITALS — BP 143/93 | HR 80 | Ht 62.0 in | Wt 280.0 lb

## 2021-02-04 DIAGNOSIS — M1812 Unilateral primary osteoarthritis of first carpometacarpal joint, left hand: Secondary | ICD-10-CM | POA: Diagnosis not present

## 2021-02-04 DIAGNOSIS — M180 Bilateral primary osteoarthritis of first carpometacarpal joints: Secondary | ICD-10-CM | POA: Diagnosis not present

## 2021-02-04 DIAGNOSIS — M1811 Unilateral primary osteoarthritis of first carpometacarpal joint, right hand: Secondary | ICD-10-CM | POA: Diagnosis not present

## 2021-02-04 DIAGNOSIS — Z1231 Encounter for screening mammogram for malignant neoplasm of breast: Secondary | ICD-10-CM

## 2021-02-04 NOTE — Progress Notes (Signed)
Chief Complaint  Patient presents with   Hand Pain    Bilateral    65 year old female with bilateral thumb pain no history of trauma.  History of prior injections for de Quervain's tenosynovitis.  Pain seems to be more global around the thumb and the [facility] joint this time  Problem list medications surgical history medical history of been reviewed  Patient has diabetes and is on a blood thinner Eliquis    BP (!) 143/93   Pulse 80   Ht 5\' 2"  (1.575 m)   Wt 280 lb (127 kg)   BMI 51.21 kg/m   Physical Exam Constitutional:      General: She is not in acute distress.    Appearance: She is well-developed.     Comments: Well developed, well nourished Normal grooming and hygiene     Cardiovascular:     Comments: No peripheral edema Skin:    General: Skin is warm and dry.  Neurological:     Mental Status: She is alert and oriented to person, place, and time.     Sensory: No sensory deficit.     Coordination: Coordination normal.     Gait: Gait normal.     Deep Tendon Reflexes: Reflexes are normal and symmetric.  Psychiatric:        Mood and Affect: Mood normal.        Behavior: Behavior normal.        Thought Content: Thought content normal.        Judgment: Judgment normal.     Comments: Affect normal     She is tender over the [facility] joint with positive grind test bilaterally she has full range of motion weak pinch.  Tenderness over the first extensor compartment  Joint injection [facility] joints right and left  Consent was given for injections right and left [facility] joint thumb   The [facility] joint and was injected with 6 mg of Celestone 4 cc 1% lidocaine this was well tolerated without complication on the right  The [facility] joint was injected with 6 mg of Celestone 4 cc 1% lidocaine this was well tolerated on the left

## 2021-02-04 NOTE — Telephone Encounter (Signed)
Pt notified to call ordering provider for MRI results.

## 2021-02-04 NOTE — Progress Notes (Signed)
[place], [place] [address]   CLINIC:  Medical Oncology/Hematology  PCP:  Lindell Spar, MD [address] [place] [address] [phone]   REASON FOR VISIT:  Follow-up for lymphadenopathy and iron deficiency anemia   PRIOR THERAPY: None   CURRENT THERAPY: Intermittent Feraheme (last on 05/17/2020), iron tablet daily   INTERVAL HISTORY:  Monique Gomez 65 y.o. female returns for routine follow-up of lymphadenopathy and iron deficiency anemia.  She was last seen by Dr. Delton Coombes on 07/16/2020.   At today's visit, she reports feeling fairly well.    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  She is still having some residual fatigue and shortness of breath.  She continues to take her daily iron tablet.  She complains of fatigue, dyspnea on exertion, and racing heartbeat.  She denies any pica, chest pain, syncope, or dizziness.  No signs or symptoms of bleeding such as epistaxis, hematemesis, hematochezia, or melena.   She denies any current lymphadenopathy in her neck or collarbone region.  She reports that she has intermittent swollen lymph nodes on her collarbone that are associated with flareups and her chronic sinusitis.  She reports that she continues to see Dr. Benjamine Mola due to chronic inflammation and swelling of her nasal passages related to narrow nasal passages.  She denies any recent infections, fever, chills, night sweats, or unintentional weight loss.   She has 50% energy and 50% appetite. She endorses that she is maintaining a stable weight.    REVIEW OF SYSTEMS:  Review of Systems  Constitutional:  Positive for fatigue. Negative for appetite change, chills, diaphoresis, fever and unexpected weight change.  HENT:   Negative for lump/mass and nosebleeds.   Eyes:  Negative for eye problems.  Respiratory:  Positive for shortness of breath (With exertion). Negative  for cough, hemoptysis and wheezing.   Cardiovascular:  Negative for chest pain, leg swelling and palpitations.  Gastrointestinal:  Negative for abdominal pain, blood in stool, constipation, diarrhea, nausea and vomiting.  Genitourinary:  Negative for hematuria.   Musculoskeletal:  Positive for back pain.  Skin: Negative.   Neurological:  Negative for dizziness, headaches and light-headedness.  Hematological:  Does not bruise/bleed easily.     PAST MEDICAL/SURGICAL HISTORY:  Past Medical History:  Diagnosis Date   Abdominal fibromatosis    ACS (acute coronary syndrome) ([place])    Acute viral myocarditis 09/22/2020   Allergic rhinitis    Anemia    Asthma    Essential hypertension    GERD (gastroesophageal reflux disease)    Headache(784.0)    History of cardiac catheterization    No significant CAD May 2015   History of migraine headaches    Hyperlipidemia    Migraines    Myocarditis ([place])    a. diagnosed in 05/2020 with cath showing normal cors   Obesity    PAF (paroxysmal atrial fibrillation) ([place]) 08/2013   Sleep apnea    Type 2 diabetes mellitus [facility])    Past Surgical History:  Procedure Laterality Date   CATARACT EXTRACTION     CATARACT EXTRACTION W/PHACO Right 04/05/2019   Procedure: CATARACT EXTRACTION PHACO AND INTRAOCULAR LENS PLACEMENT RIGHT EYE (CDE: 3.19);  Surgeon: Baruch Goldmann, MD;  Location: [facility];  Service: Ophthalmology;  Laterality: Right;   COLONOSCOPY N/A 02/02/2015   SLF: 1. one colon polyp removed-no source for anemia identified. 2. moderate diverticulosis noted in the sigmoid  colon and descending colon 3. the left colon is redundant 4. Rectal bleeding due ot small internal hemorroids 5. Moderate sized external hemorrhoids.   ESOPHAGOGASTRODUODENOSCOPY N/A 02/02/2015   SLF: 1. 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 large hiatal hernia 3. mild non-erosive gastritis 4. No source for anemia identified.    LEFT HEART CATH AND CORONARY ANGIOGRAPHY  N/A 05/14/2020   Procedure: LEFT HEART CATH AND CORONARY ANGIOGRAPHY;  Surgeon: Belva Crome, MD;  Location: [place] CV LAB;  Service: Cardiovascular;  Laterality: N/A;   LEFT HEART CATHETERIZATION WITH CORONARY ANGIOGRAM N/A 09/03/2013   Procedure: LEFT HEART CATHETERIZATION WITH CORONARY ANGIOGRAM;  Surgeon: Jettie Booze, MD;  Location: [facility] CATH LAB;  Service: Cardiovascular;  Laterality: N/A;   Right carpal tunnel release       SOCIAL HISTORY:  Social History   Socioeconomic History   Marital status: Single    Spouse name: Not on file   Number of children: 2   Years of education: Not on file   Highest education level: Not on file  Occupational History   Occupation: Education officer, museum    Employer: [employer]  Tobacco Use   Smoking status: Former    Packs/day: 0.30    Types: Cigarettes    Quit date: 04/01/2012    Years since quitting: 8.8   Smokeless tobacco: Never  Vaping Use   Vaping Use: Never used  Substance and Sexual Activity   Alcohol use: Not Currently    Alcohol/week: 0.0 standard drinks    Comment: very occasional   Drug use: No   Sexual activity: Yes  Other Topics Concern   Not on file  Social History Narrative   Not on file   Social Determinants of Health   Financial Resource Strain: Not on file  Food Insecurity: Not on file  Transportation Needs: Not on file  Physical Activity: Not on file  Stress: Not on file  Social Connections: Not on file  Intimate Partner Violence: Not on file    FAMILY HISTORY:  Family History  Problem Relation Age of Onset   Diabetes Mother    Hypertension Mother    Heart disease Mother    High Cholesterol Mother    Alzheimer's disease Father    Hypertension Sister    Hypertension Brother    Diabetes Brother    Arthritis Sister    Hypertension Sister    High Cholesterol Sister    Diabetes Son    Developmental delay Son    Colon cancer Neg Hx     CURRENT MEDICATIONS:  Outpatient Encounter Medications  as of 02/05/2021  Medication Sig Note   acetaminophen (TYLENOL) 500 MG tablet Take 1 tablet (500 mg total) by mouth every 6 (six) hours as needed.    apixaban (ELIQUIS) 5 MG TABS tablet Take 1 tablet (5 mg total) by mouth 2 (two) times daily.    atorvastatin (LIPITOR) 80 MG tablet Take 1 tablet by mouth once daily    empagliflozin (JARDIANCE) 10 MG TABS tablet Take 1 tablet (10 mg total) by mouth daily before breakfast.    fluticasone (FLONASE) 50 MCG/ACT nasal spray Place 2 sprays into both nostrils daily.    Galcanezumab-gnlm (EMGALITY) 120 MG/ML SOAJ Inject 120 mg into the skin every 30 (thirty) days. 07/22/2020: Approved Effective from 07/21/2020 through 10/12/2020.   ipratropium (ATROVENT) 0.03 % nasal spray Place 2 sprays into both nostrils every 12 (twelve) hours.    Iron-FA-B Cmp-C-Biot-Probiotic (FUSION PLUS) CAPS Take 2 tablets  by mouth daily.    meclizine (ANTIVERT) 25 MG tablet Take 1 tablet (25 mg total) by mouth 3 (three) times daily as needed for dizziness.    metFORMIN (GLUCOPHAGE) 500 MG tablet TAKE 1 TABLET BY MOUTH TWICE DAILY WITH A MEAL    metoCLOPramide (REGLAN) 10 MG tablet Take 1 tablet (10 mg total) by mouth every 8 (eight) hours as needed for up to 7 days for nausea (Headache).    metoprolol succinate (TOPROL-XL) 50 MG 24 hr tablet Take 1 tablet (50 mg total) by mouth in the morning and at bedtime. Take with or immediately following a meal.    pantoprazole (PROTONIX) 40 MG tablet Take 1 tablet by mouth twice daily    sacubitril-valsartan (ENTRESTO) 97-103 MG Take 1 tablet by mouth 2 (two) times daily.    topiramate (TOPAMAX) 50 MG tablet Take 1 tablet (50 mg total) by mouth at bedtime.    Ubrogepant (UBRELVY) 50 MG TABS Take 1 tablet at the onset of a migraine.  Can repeat in 2 hours if needed.    No facility-administered encounter medications on file as of 02/05/2021.    ALLERGIES:  Allergies  Allergen Reactions   Venlafaxine Nausea And Vomiting and Other (See  Comments)    Dizziness, shakiness     PHYSICAL EXAM:  ECOG PERFORMANCE STATUS: 1 - Symptomatic but completely ambulatory  There were no vitals filed for this visit. There were no vitals filed for this visit. Physical Exam Constitutional:      Appearance: Normal appearance. She is obese.  HENT:     Head: Normocephalic and atraumatic.     Mouth/Throat:     Mouth: Mucous membranes are moist.  Eyes:     Extraocular Movements: Extraocular movements intact.     Pupils: Pupils are equal, round, and reactive to light.  Cardiovascular:     Rate and Rhythm: Normal rate and regular rhythm.     Pulses: Normal pulses.     Heart sounds: Normal heart sounds.  Pulmonary:     Effort: Pulmonary effort is normal.     Breath sounds: Normal breath sounds.     Comments: Lung sounds obscured due to body habitus Abdominal:     General: Bowel sounds are normal.     Palpations: Abdomen is soft.     Tenderness: There is no abdominal tenderness.  Musculoskeletal:        General: No swelling.     Right lower leg: No edema.     Left lower leg: No edema.  Lymphadenopathy:     Cervical: No cervical adenopathy.  Skin:    General: Skin is warm and dry.  Neurological:     General: No focal deficit present.     Mental Status: She is alert and oriented to person, place, and time.  Psychiatric:        Mood and Affect: Mood normal.        Behavior: Behavior normal.     LABORATORY DATA:  I have reviewed the labs as listed.  CBC    Component Value Date/Time   WBC 10.0 01/13/2021 0852   RBC 5.68 (H) 01/13/2021 0852   HGB 12.6 01/13/2021 0852   HGB 11.2 07/17/2019 1148   HCT 42.7 01/13/2021 0852   HCT 37.2 07/17/2019 1148   PLT 352 01/13/2021 0852   PLT 352 07/17/2019 1148   MCV 75.2 (L) 01/13/2021 0852   MCV 75 (L) 07/17/2019 1148   MCH 22.2 (L) 01/13/2021 0852   MCHC  29.5 (L) 01/13/2021 0852   RDW 15.3 01/13/2021 0852   RDW 14.7 07/17/2019 1148   LYMPHSABS 2.9 01/13/2021 0852   LYMPHSABS  1.8 07/17/2019 1148   MONOABS 0.8 01/13/2021 0852   EOSABS 0.2 01/13/2021 0852   EOSABS 0.2 07/17/2019 1148   BASOSABS 0.1 01/13/2021 0852   BASOSABS 0.0 07/17/2019 1148   CMP Latest Ref Rng & Units 01/11/2021 09/24/2020 08/28/2020  Glucose 70 - 99 mg/dL 141(H) 137(H) 109(H)  BUN 8 - 23 mg/dL 22 18 17   Creatinine 0.44 - 1.00 mg/dL 1.04(H) 0.93 0.84  Sodium 135 - 145 mmol/L 137 139 140  Potassium 3.5 - 5.1 mmol/L 4.3 3.8 4.3  Chloride 98 - 111 mmol/L 104 105 104  CO2 22 - 32 mmol/L 25 25 28   Calcium 8.9 - 10.3 mg/dL 9.6 9.4 9.7  Total Protein 6.5 - 8.1 g/dL - - -  Total Bilirubin 0.3 - 1.2 mg/dL - - -  Alkaline Phos 38 - 126 U/L - - -  AST 15 - 41 U/L - - -  ALT 0 - 44 U/L - - -    DIAGNOSTIC IMAGING:  I have independently reviewed the relevant imaging and discussed with the patient.   ASSESSMENT & PLAN: 1.  Subcentimeter left supraclavicular adenopathy, waxing and waning - She reported lymphadenopathy since ~September 2020 - Ultrasound soft tissue neck on 01/25/2019 shows left supraclavicular lymph node measuring 1 cm.  A CT of the soft tissue of the neck on the same day showed 3 small supraclavicular lymph nodes, largest measuring 8-9 mm.  Small posterior triangle lymph node, none more than 9 mm.  Consistent with mild reactive adenitis. - CT soft tissue neck with contrast on 02/22/2019 showed right level 2 lymph node measuring 6 mm unchanged.  Subcentimeter posterior triangle lymph nodes on the right unchanged.  Largest measures 8 mm.  Left level 2 lymph node measures 8.6 mm unchanged.  Cluster of supraclavicular lymph nodes on the left also slightly smaller.  Largest measures 5 mm.  No oropharyngeal masses. - CT soft tissue neck on 06/17/2019 shows left level 2 lymph node measuring 1.2 cm, previously 1.1 cm.  Stable cluster of nonenlarged left supraclavicular lymph nodes. - CT angiogram from 05/13/2020 which showed no lymphadenopathy in the base of the neck.  No thoracic adenopathy.   Diffuse normal-sized mediastinal and axillary lymph nodes noted. - Former smoker.  She smoked 1 pack of cigarettes per week for 35 years.  She quit smoking in 2013. - She sees Dr. Benjamine Mola for chronic sinusitis  - No recent infections or antibiotics  - She does not report any fevers, night sweats or weight loss in the last 6 months.   - Physical examination today did not reveal any palpable adenopathy in the neck region or axillary region.   - Most recent labs are negative for leukocytosis or lymphocytosis - Suspect intermittent reactive lymphadenitis/lymphadenopathy, likely related to flareups of her chronic sinusitis - PLAN: No further work-up indicated at this time.  We will reassess with physical exam and follow-up visit in 6 months.   2.  Iron deficiency anemia: - She received Feraheme on 05/17/2020 while she was hospitalized. - She reports taking iron tablet daily for many years.   - EGD/colonoscopy (02/02/2015): Nonerosive gastritis, no source for anemia identified on EGD.  Polyp and diverticulosis, rectal bleeding from small internal hemorrhoids. 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  Family history positive for anemia, unspecified type.  Patient denies any known  history of familial sickle cell disease. - No major bleeding episodes such as epistaxis, hematemesis, hematochezia, or melena - Symptomatic with fatigue and dyspnea on exertion - Most recent labs (01/13/2021): Normal Hgb 12.6 with MCV 75.2 and elevated RBC 5.60.  Ferritin is low at 8 with iron saturation 11% and TIBC 406 - PLAN: Due to worsening ferritin and symptoms of fatigue, despite oral iron supplementation, we recommend IV iron therapy with Venofer x3.  Repeat CBC & iron panel and follow-up in 6 months.  We will consider hemoglobin cascade at that time as well.    PLAN SUMMARY & DISPOSITION: -IV Venofer x3 ([phone]) - Labs in 6 months - RTC after labs  All questions were answered. The patient  knows to call the clinic with any problems, questions or concerns.  Medical decision making: Moderate  Time spent on visit: I spent 20 minutes counseling the patient face to face. The total time spent in the appointment was 30 minutes and more than 50% was on counseling.   Harriett Rush, PA-C  02/05/2021 8:48 AM

## 2021-02-04 NOTE — Telephone Encounter (Signed)
Pt returned your call.  

## 2021-02-04 NOTE — Telephone Encounter (Signed)
Pt aware of results/results signed cardiac mri    If anything abnormal was seen we would have called however at this time-results have been reviewed and signed off   Nothing further needed at this time  Pt appreciative of return call and will review in person at follow up

## 2021-02-05 ENCOUNTER — Inpatient Hospital Stay (HOSPITAL_COMMUNITY): Payer: BC Managed Care – PPO | Attending: Physician Assistant | Admitting: Physician Assistant

## 2021-02-05 ENCOUNTER — Encounter (HOSPITAL_COMMUNITY): Payer: Self-pay | Admitting: Physician Assistant

## 2021-02-05 VITALS — BP 126/83 | HR 99 | Temp 98.1°F | Resp 20 | Wt 281.4 lb

## 2021-02-05 DIAGNOSIS — R591 Generalized enlarged lymph nodes: Secondary | ICD-10-CM | POA: Diagnosis not present

## 2021-02-05 DIAGNOSIS — Z79899 Other long term (current) drug therapy: Secondary | ICD-10-CM | POA: Diagnosis not present

## 2021-02-05 DIAGNOSIS — I10 Essential (primary) hypertension: Secondary | ICD-10-CM | POA: Insufficient documentation

## 2021-02-05 DIAGNOSIS — Z87891 Personal history of nicotine dependence: Secondary | ICD-10-CM | POA: Insufficient documentation

## 2021-02-05 DIAGNOSIS — Z7984 Long term (current) use of oral hypoglycemic drugs: Secondary | ICD-10-CM | POA: Insufficient documentation

## 2021-02-05 DIAGNOSIS — Z7901 Long term (current) use of anticoagulants: Secondary | ICD-10-CM | POA: Diagnosis not present

## 2021-02-05 DIAGNOSIS — R590 Localized enlarged lymph nodes: Secondary | ICD-10-CM | POA: Diagnosis not present

## 2021-02-05 DIAGNOSIS — I480 Paroxysmal atrial fibrillation: Secondary | ICD-10-CM | POA: Insufficient documentation

## 2021-02-05 DIAGNOSIS — D509 Iron deficiency anemia, unspecified: Secondary | ICD-10-CM

## 2021-02-05 DIAGNOSIS — E119 Type 2 diabetes mellitus without complications: Secondary | ICD-10-CM | POA: Insufficient documentation

## 2021-02-05 DIAGNOSIS — D508 Other iron deficiency anemias: Secondary | ICD-10-CM | POA: Diagnosis not present

## 2021-02-05 HISTORY — DX: Iron deficiency anemia, unspecified: D50.9

## 2021-02-05 NOTE — Patient Instructions (Signed)
[place] at [facility] Discharge Instructions  You were seen today by Tarri Abernethy PA-C for your iron deficiency anemia and your lymph node swelling.  IRON DEFICIENCY ANEMIA: Your blood count is normal at this visit, but your iron level remains low despite taking iron pills at home.  This may be causing some of your fatigue.  We will schedule you for IV iron x3 doses.  We will recheck your labs and see you again in 6 months.  LYMPH NODE SWELLING: No abnormal lymph nodes were found on exam today.  I suspect that the on-and-off lymph node swelling you experience is secondary to inflammation in your nasal cavity.  If you have any lymph node swelling that does not go away, please let us know.  LABS: Return in 6 months for repeat labs  OTHER TESTS: No other tests at this time  MEDICATIONS: No changes to home medications  FOLLOW-UP APPOINTMENT: Office visit in 6 months, after labs   Thank you for choosing [place] at [facility] to provide your oncology and hematology care.  To afford each patient quality time with our provider, please arrive at least 15 minutes before your scheduled appointment time.   If you have a lab appointment with the [facility] please come in thru the Main Entrance and check in at the main information desk.  You need to re-schedule your appointment should you arrive 10 or more minutes late.  We strive to give you quality time with our providers, and arriving late affects you and other patients whose appointments are after yours.  Also, if you no show three or more times for appointments you may be dismissed from the clinic at the providers discretion.     Again, thank you for choosing [facility].  Our hope is that these requests will decrease the amount of time that you wait before being seen by our physicians.       _____________________________________________________________  Should you have  questions after your visit to [facility], please contact our office at [phone] and follow the prompts.  Our office hours are 8:00 a.m. and 4:30 p.m. Monday - Friday.  Please note that voicemails left after 4:00 p.m. may not be returned until the following business day.  We are closed weekends and major holidays.  You do have access to a nurse 24-7, just call the main number to the clinic [phone] and do not press any options, hold on the line and a nurse will answer the phone.    For prescription refill requests, have your pharmacy contact our office and allow 72 hours.    Due to Covid, you will need to wear a mask upon entering the hospital. If you do not have a mask, a mask will be given to you at the Main Entrance upon arrival. For doctor visits, patients may have 1 support person age 44 or older with them. For treatment visits, patients can not have anyone with them due to social distancing guidelines and our immunocompromised population.

## 2021-02-09 ENCOUNTER — Other Ambulatory Visit: Payer: Self-pay

## 2021-02-09 ENCOUNTER — Ambulatory Visit: Payer: BC Managed Care – PPO | Admitting: Genetic Counselor

## 2021-02-09 ENCOUNTER — Telehealth: Payer: Self-pay | Admitting: *Deleted

## 2021-02-09 NOTE — Telephone Encounter (Signed)
Spoke with Patient and she made me aware that she no longer took aimovig due to not lasting the whole month and had Elevated BP. Currently om Emgality and patient stated works better for her .

## 2021-02-12 ENCOUNTER — Inpatient Hospital Stay (HOSPITAL_COMMUNITY): Payer: BC Managed Care – PPO

## 2021-02-12 ENCOUNTER — Encounter (HOSPITAL_COMMUNITY): Payer: Self-pay

## 2021-02-12 ENCOUNTER — Other Ambulatory Visit: Payer: Self-pay

## 2021-02-12 VITALS — BP 128/85 | HR 65 | Temp 97.1°F | Resp 20

## 2021-02-12 DIAGNOSIS — D509 Iron deficiency anemia, unspecified: Secondary | ICD-10-CM | POA: Diagnosis not present

## 2021-02-12 DIAGNOSIS — D508 Other iron deficiency anemias: Secondary | ICD-10-CM

## 2021-02-12 MED ORDER — SODIUM CHLORIDE 0.9 % IV SOLN: Freq: Once | INTRAVENOUS | Status: AC

## 2021-02-12 MED ORDER — SODIUM CHLORIDE 0.9 % IV SOLN
300.0000 mg | Freq: Once | INTRAVENOUS | Status: AC
  Administered 2021-02-12: 300 mg via INTRAVENOUS
  Filled 2021-02-12: qty 300

## 2021-02-12 MED ORDER — ACETAMINOPHEN 325 MG PO TABS: 650.0000 mg | ORAL_TABLET | Freq: Once | ORAL | Status: DC

## 2021-02-12 NOTE — Patient Instructions (Signed)
[facility]  Discharge Instructions: Thank you for choosing [facility] to provide your oncology and hematology care.  If you have a lab appointment with the Cancer Center, please come in thru the Main Entrance and check in at the main information desk.     We strive to give you quality time with your provider. You may need to reschedule your appointment if you arrive late (15 or more minutes).  Arriving late affects you and other patients whose appointments are after yours.  Also, if you miss three or more appointments without notifying the office, you may be dismissed from the clinic at the provider's discretion.      For prescription refill requests, have your pharmacy contact our office and allow 72 hours for refills to be completed.     Should you have questions after your visit or need to cancel or reschedule your appointment, please contact [facility] [phone]  and follow the prompts.  Office hours are 8:00 a.m. to 4:30 p.m. Monday - Friday. Please note that voicemails left after 4:00 p.m. may not be returned until the following business day.  We are closed weekends and major holidays. You have access to a nurse at all times for urgent questions. Please call the main number to the clinic [phone] and follow the prompts.  For any non-urgent questions, you may also contact your provider using MyChart. We now offer e-Visits for anyone 18 and older to request care online for non-urgent symptoms. For details visit [email or web address].   Also download the MyChart app! Go to the app store, search "MyChart", open the app, select [place], and log in with your MyChart username and password.  Due to Covid, a mask is required upon entering the hospital/clinic. If you do not have a mask, one will be given to you upon arrival. For doctor visits, patients may have 1 support person aged 18 or older with them. For treatment visits, patients cannot have  anyone with them due to current Covid guidelines and our immunocompromised population.  

## 2021-02-12 NOTE — Progress Notes (Signed)
Iron infusion given per orders. Patient tolerated it well without problems. Vitals stable and discharged home from clinic ambulatory. Follow up as scheduled.  

## 2021-02-12 NOTE — Progress Notes (Signed)
Chaplain engaged in an initial visit with [place].  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  She has worked with the [employer] in [place].  Drinda shared about what made her become Education officer, museum and the passion she holds for her work.  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  She believes in helping others the same way she received help when she needed it most.  Though she faced some difficulties along the way, Zakiyyah was able to obtain the resources and education she has needed to thrive in Social Work.  Chanique received her masters in Social Work from [employer] on a full ride scholarship.    Cashay also shared about her healthcare journey and what it has meant for her to take a step back within this year.  She was used to being the "energizer bunny" and always being able to do and go for others.  Through her changes in her health, she has had to cut down on the work that she does.  Aiysha is looking forward to continuing to help others on a part-time basis so that she can also take care of herself.    Chaplain and Nayzeth spent time talking about life, family and work.  Zaeda is well supported and lives by a number of family members.  Chaplain offered listening and presence.     02/12/21 1100  Clinical Encounter Type  Visited With Patient  Visit Type Initial

## 2021-02-19 ENCOUNTER — Inpatient Hospital Stay (HOSPITAL_COMMUNITY): Payer: BC Managed Care – PPO

## 2021-02-19 ENCOUNTER — Other Ambulatory Visit: Payer: Self-pay

## 2021-02-19 ENCOUNTER — Encounter (HOSPITAL_COMMUNITY): Payer: Self-pay

## 2021-02-19 VITALS — BP 124/81 | HR 75 | Temp 96.6°F | Resp 18

## 2021-02-19 DIAGNOSIS — D508 Other iron deficiency anemias: Secondary | ICD-10-CM

## 2021-02-19 DIAGNOSIS — D509 Iron deficiency anemia, unspecified: Secondary | ICD-10-CM | POA: Diagnosis not present

## 2021-02-19 MED ORDER — SODIUM CHLORIDE 0.9 % IV SOLN
300.0000 mg | Freq: Once | INTRAVENOUS | Status: AC
  Administered 2021-02-19: 300 mg via INTRAVENOUS
  Filled 2021-02-19: qty 300

## 2021-02-19 MED ORDER — SODIUM CHLORIDE 0.9 % IV SOLN: Freq: Once | INTRAVENOUS | Status: AC

## 2021-02-19 NOTE — Progress Notes (Signed)
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  Johnnye reports feeling food and having no changes to her health since we met. She tells me that she has some heart palpitations and that her EF is now much better. We then go over her pedigree and she notes no changes in her family tree. Does point out an error where it shows that her youngest brother (III.4) had three daughter while in fact he had three sons.   I informed her that she does not have a pathogenic variant in the genes implicated in inherited cardiovascular conditions that can lead to nonischemic cardiomyopathy. She was very relieved to hear this. 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   Even though she has a negative genetic test, I emphasized that it is important for her children to stay vigilant and seek medical care if they have symptoms related to a heart condition. She verbalized understanding of this.  [name], Ph.D, [facility] Clinical Molecular Geneticist

## 2021-02-19 NOTE — Patient Instructions (Signed)
[facility]  Discharge Instructions: Thank you for choosing [facility] to provide your oncology and hematology care.  If you have a lab appointment with the Cancer Center, please come in thru the Main Entrance and check in at the main information desk.  Wear comfortable clothing and clothing appropriate for easy access to any Portacath or PICC line.   We strive to give you quality time with your provider. You may need to reschedule your appointment if you arrive late (15 or more minutes).  Arriving late affects you and other patients whose appointments are after yours.  Also, if you miss three or more appointments without notifying the office, you may be dismissed from the clinic at the provider's discretion.      For prescription refill requests, have your pharmacy contact our office and allow 72 hours for refills to be completed.    Today you received the following: Venofer, return as scheduled.   To help prevent nausea and vomiting after your treatment, we encourage you to take your nausea medication as directed.  BELOW ARE SYMPTOMS THAT SHOULD BE REPORTED IMMEDIATELY: *FEVER GREATER THAN 100.4 F (38 C) OR HIGHER *CHILLS OR SWEATING *NAUSEA AND VOMITING THAT IS NOT CONTROLLED WITH YOUR NAUSEA MEDICATION *UNUSUAL SHORTNESS OF BREATH *UNUSUAL BRUISING OR BLEEDING *URINARY PROBLEMS (pain or burning when urinating, or frequent urination) *BOWEL PROBLEMS (unusual diarrhea, constipation, pain near the anus) TENDERNESS IN MOUTH AND THROAT WITH OR WITHOUT PRESENCE OF ULCERS (sore throat, sores in mouth, or a toothache) UNUSUAL RASH, SWELLING OR PAIN  UNUSUAL VAGINAL DISCHARGE OR ITCHING   Items with * indicate a potential emergency and should be followed up as soon as possible or go to the Emergency Department if any problems should occur.  Please show the CHEMOTHERAPY ALERT CARD or IMMUNOTHERAPY ALERT CARD at check-in to the Emergency Department and triage  nurse.  Should you have questions after your visit or need to cancel or reschedule your appointment, please contact  CANCER CENTER [phone]  and follow the prompts.  Office hours are 8:00 a.m. to 4:30 p.m. Monday - Friday. Please note that voicemails left after 4:00 p.m. may not be returned until the following business day.  We are closed weekends and major holidays. You have access to a nurse at all times for urgent questions. Please call the main number to the clinic [phone] and follow the prompts.  For any non-urgent questions, you may also contact your provider using MyChart. We now offer e-Visits for anyone 18 and older to request care online for non-urgent symptoms. For details visit [email or web address].   Also download the MyChart app! Go to the app store, search "MyChart", open the app, select [place], and log in with your MyChart username and password.  Due to Covid, a mask is required upon entering the hospital/clinic. If you do not have a mask, one will be given to you upon arrival. For doctor visits, patients may have 1 support person aged 18 or older with them. For treatment visits, patients cannot have anyone with them due to current Covid guidelines and our immunocompromised population.  

## 2021-02-19 NOTE — Progress Notes (Signed)
Chaplain engaged in a follow-up visit with Monique Gomez.  She shared that she has felt better since her first infusion despite experiencing some nausea.  She is looking forward to retiring on the 31st and then preparing to travel.  Monique Gomez was in good spirits and is excited about what is to come.  Chaplain offered support, presence, and listening.  Chaplain is available to follow-up.    02/19/21 1200  Clinical Encounter Type  Visited With Patient  Visit Type Follow-up

## 2021-02-19 NOTE — Progress Notes (Signed)
Patient presents today for iron. Patient states she took tylenol at home this morning at 0730, pharmacy aware. Patient tolerated iron infusion with no complaints voiced. Peripheral IV site clean and dry with good blood return noted before and after infusion. Band aid applied. VSS with discharge and left in satisfactory condition with no s/s of distress noted.

## 2021-02-25 MED FILL — Iron Sucrose Inj 20 MG/ML (Fe Equiv): INTRAVENOUS | Qty: 20 | Status: AC

## 2021-02-26 ENCOUNTER — Other Ambulatory Visit: Payer: Self-pay

## 2021-02-26 ENCOUNTER — Encounter (HOSPITAL_COMMUNITY): Payer: Self-pay

## 2021-02-26 ENCOUNTER — Inpatient Hospital Stay (HOSPITAL_COMMUNITY): Payer: BC Managed Care – PPO

## 2021-02-26 VITALS — BP 108/77 | HR 61 | Temp 96.7°F | Resp 20

## 2021-02-26 DIAGNOSIS — D508 Other iron deficiency anemias: Secondary | ICD-10-CM

## 2021-02-26 DIAGNOSIS — D509 Iron deficiency anemia, unspecified: Secondary | ICD-10-CM | POA: Diagnosis not present

## 2021-02-26 MED ORDER — ACETAMINOPHEN 325 MG PO TABS: 650.0000 mg | ORAL_TABLET | Freq: Once | ORAL | Status: DC

## 2021-02-26 MED ORDER — SODIUM CHLORIDE 0.9 % IV SOLN: Freq: Once | INTRAVENOUS | Status: AC

## 2021-02-26 MED ORDER — SODIUM CHLORIDE 0.9 % IV SOLN
400.0000 mg | Freq: Once | INTRAVENOUS | Status: AC
  Administered 2021-02-26: 400 mg via INTRAVENOUS
  Filled 2021-02-26: qty 20

## 2021-02-26 NOTE — Patient Instructions (Signed)
[facility]  Discharge Instructions: Thank you for choosing [facility] to provide your oncology and hematology care.  If you have a lab appointment with the Cancer Center, please come in thru the Main Entrance and check in at the main information desk.     We strive to give you quality time with your provider. You may need to reschedule your appointment if you arrive late (15 or more minutes).  Arriving late affects you and other patients whose appointments are after yours.  Also, if you miss three or more appointments without notifying the office, you may be dismissed from the clinic at the provider's discretion.      For prescription refill requests, have your pharmacy contact our office and allow 72 hours for refills to be completed.     Should you have questions after your visit or need to cancel or reschedule your appointment, please contact [facility] [phone]  and follow the prompts.  Office hours are 8:00 a.m. to 4:30 p.m. Monday - Friday. Please note that voicemails left after 4:00 p.m. may not be returned until the following business day.  We are closed weekends and major holidays. You have access to a nurse at all times for urgent questions. Please call the main number to the clinic [phone] and follow the prompts.  For any non-urgent questions, you may also contact your provider using MyChart. We now offer e-Visits for anyone 18 and older to request care online for non-urgent symptoms. For details visit [email or web address].   Also download the MyChart app! Go to the app store, search "MyChart", open the app, select [place], and log in with your MyChart username and password.  Due to Covid, a mask is required upon entering the hospital/clinic. If you do not have a mask, one will be given to you upon arrival. For doctor visits, patients may have 1 support person aged 18 or older with them. For treatment visits, patients cannot have  anyone with them due to current Covid guidelines and our immunocompromised population.  

## 2021-02-26 NOTE — Progress Notes (Signed)
Iron infusion given per orders. Patient tolerated it well without problems. Vitals stable and discharged home from clinic ambulatory. Follow up as scheduled.  

## 2021-03-02 ENCOUNTER — Encounter (HOSPITAL_COMMUNITY): Payer: Self-pay | Admitting: Hematology

## 2021-03-02 NOTE — Progress Notes (Signed)
Cardiology Office Note    Date:  03/03/2021   ID:  ANGELINE Gomez, DOB 1955/07/01, MRN [id number]  PCP:  Lindell Spar, MD  Cardiologist: Rozann Lesches, MD    Chief Complaint  Patient presents with   Follow-up    3 month visit    History of Present Illness:    Monique Gomez is a 65 y.o. female with past medical history of paroxysmal atrial fibrillation, acute viral myocarditis (occurring in 05/2020 with cath showing normal cors but MRI did show severe asymmetric LVH and concern for possible infiltrative cardiomyopathy), HTN, HLD, OSA (on CPAP), Type II DM and GERD who presents to the office today for 2-month follow-up.   She was last examined by myself in 11/2020 and reported improvement in her energy level since her last office visit but was still having dyspnea on exertion and intermittent palpitations. She was continued on her current cardiac medications with repeat cMRI pending. This was performed in 12/2020 and showed resolution of her myocarditis when compared to prior imaging. Was noted to have severe asymmetric LVH consistent with hypertrophic cardiomyopathy and EF was normal at 68%.   In talking with the patient today, she reports overall significant improvement in her dyspnea and fatigue since her last office visit. She was evaluated by Hematology and also started on iron infusions and has noted improvement with this.She does experience occasional palpitations but this has improved over the past several months. No recent chest pain, orthopnea, PND or pitting edema.   Past Medical History:  Diagnosis Date   Abdominal fibromatosis    ACS (acute coronary syndrome) ([place])    Acute viral myocarditis 09/22/2020   Allergic rhinitis    Anemia    Arrhythmia    Asthma    Essential hypertension    GERD (gastroesophageal reflux disease)    Headache(784.0)    History of cardiac catheterization    No significant CAD May 2015   History of migraine headaches     Hyperlipidemia    Iron deficiency anemia 02/05/2021   Migraines    Myocarditis ([place])    a. diagnosed in 05/2020 with cath showing normal cors   Obesity    PAF (paroxysmal atrial fibrillation) ([place]) 08/2013   Sleep apnea    Type 2 diabetes mellitus [facility])     Past Surgical History:  Procedure Laterality Date   CATARACT EXTRACTION     CATARACT EXTRACTION W/PHACO Right 04/05/2019   Procedure: CATARACT EXTRACTION PHACO AND INTRAOCULAR LENS PLACEMENT RIGHT EYE (CDE: 3.19);  Surgeon: Baruch Goldmann, MD;  Location: [facility];  Service: Ophthalmology;  Laterality: Right;   COLONOSCOPY N/A 02/02/2015   SLF: 1. one colon polyp removed-no source for anemia identified. 2. moderate diverticulosis noted in the sigmoid colon and descending colon 3. the left colon is redundant 4. Rectal bleeding due ot small internal hemorroids 5. Moderate sized external hemorrhoids.   ESOPHAGOGASTRODUODENOSCOPY N/A 02/02/2015   SLF: 1. 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 large hiatal hernia 3. mild non-erosive gastritis 4. No source for anemia identified.    LEFT HEART CATH AND CORONARY ANGIOGRAPHY N/A 05/14/2020   Procedure: LEFT HEART CATH AND CORONARY ANGIOGRAPHY;  Surgeon: Belva Crome, MD;  Location: [facility];  Service: Cardiovascular;  Laterality: N/A;   LEFT HEART CATHETERIZATION WITH CORONARY ANGIOGRAM N/A 09/03/2013   Procedure: LEFT HEART CATHETERIZATION WITH CORONARY ANGIOGRAM;  Surgeon: Jettie Booze, MD;  Location: [facility] CATH LAB;  Service: Cardiovascular;  Laterality: N/A;  Right carpal tunnel release      Current Medications: Outpatient Medications Prior to Visit  Medication Sig Dispense Refill   acetaminophen (TYLENOL) 500 MG tablet Take 1 tablet (500 mg total) by mouth every 6 (six) hours as needed. 30 tablet 0   apixaban (ELIQUIS) 5 MG TABS tablet Take 1 tablet (5 mg total) by mouth 2 (two) times daily. 60 tablet 3   atorvastatin (LIPITOR) 80 MG tablet Take 1 tablet by mouth  once daily 90 tablet 0   empagliflozin (JARDIANCE) 10 MG TABS tablet Take 1 tablet (10 mg total) by mouth daily before breakfast. 30 tablet 6   fluticasone (FLONASE) 50 MCG/ACT nasal spray Place 2 sprays into both nostrils daily.     Galcanezumab-gnlm (EMGALITY) 120 MG/ML SOAJ Inject 120 mg into the skin every 30 (thirty) days. 1.12 mL 11   ipratropium (ATROVENT) 0.03 % nasal spray Place 2 sprays into both nostrils every 12 (twelve) hours. 30 mL 3   Iron-FA-B Cmp-C-Biot-Probiotic (FUSION PLUS) CAPS Take 2 tablets by mouth daily. 60 capsule 3   meclizine (ANTIVERT) 25 MG tablet Take 1 tablet (25 mg total) by mouth 3 (three) times daily as needed for dizziness. 30 tablet 0   metFORMIN (GLUCOPHAGE) 500 MG tablet TAKE 1 TABLET BY MOUTH TWICE DAILY WITH A MEAL 180 tablet 0   metoCLOPramide (REGLAN) 10 MG tablet Take 1 tablet (10 mg total) by mouth every 8 (eight) hours as needed for up to 7 days for nausea (Headache). 21 tablet 0   pantoprazole (PROTONIX) 40 MG tablet Take 1 tablet by mouth twice daily 180 tablet 0   sacubitril-valsartan (ENTRESTO) 97-103 MG Take 1 tablet by mouth 2 (two) times daily. 180 tablet 3   topiramate (TOPAMAX) 50 MG tablet Take 1 tablet (50 mg total) by mouth at bedtime. 90 tablet 0   Ubrogepant (UBRELVY) 50 MG TABS Take 1 tablet at the onset of a migraine.  Can repeat in 2 hours if needed. 15 tablet 5   metoprolol succinate (TOPROL-XL) 50 MG 24 hr tablet Take 1 tablet (50 mg total) by mouth in the morning and at bedtime. Take with or immediately following a meal. 180 tablet 3   No facility-administered medications prior to visit.     Allergies:   Venlafaxine   Social History   Socioeconomic History   Marital status: Single    Spouse name: Not on file   Number of children: 2   Years of education: Not on file   Highest education level: Not on file  Occupational History   Occupation: Education officer, museum    Employer: [employer]  Tobacco Use   Smoking status: Former     Packs/day: 0.30    Types: Cigarettes    Quit date: 04/01/2012    Years since quitting: 8.9   Smokeless tobacco: Never  Vaping Use   Vaping Use: Never used  Substance and Sexual Activity   Alcohol use: Not Currently    Alcohol/week: 0.0 standard drinks    Comment: very occasional   Drug use: No   Sexual activity: Yes  Other Topics Concern   Not on file  Social History Narrative   Not on file   Social Determinants of Health   Financial Resource Strain: Not on file  Food Insecurity: Not on file  Transportation Needs: Not on file  Physical Activity: Not on file  Stress: Not on file  Social Connections: Not on file     Family History:  The patient's  family history includes Alzheimer's disease in her father; Arthritis in her sister; Developmental delay in her son; Diabetes in her brother, mother, and son; Heart disease in her mother; High Cholesterol in her mother and sister; Hypertension in her brother, mother, sister, and sister.   Review of Systems:    Please see the history of present illness.     All other systems reviewed and are otherwise negative except as noted above.   Physical Exam:    VS:  BP 124/80   Pulse 90   Ht 5\' 2"  (1.575 m)   Wt 284 lb (128.8 kg)   SpO2 99%   BMI 51.94 kg/m    General: Pleasant obese female appearing in no acute distress. Head: Normocephalic, atraumatic. Neck: No carotid bruits. JVD not elevated.  Lungs: Respirations regular and unlabored, without wheezes or rales.  Heart: Regular rate and rhythm. No S3 or S4.  No murmur, no rubs, or gallops appreciated. Abdomen: Appears non-distended. No obvious abdominal masses. Msk:  Strength and tone appear normal for age. No obvious joint deformities or effusions. Extremities: No clubbing or cyanosis. No pitting edema.  Distal pedal pulses are 2+ bilaterally. Neuro: Alert and oriented X 3. Moves all extremities spontaneously. No focal deficits noted. Psych:  Responds to questions  appropriately with a normal affect. Skin: No rashes or lesions noted  Wt Readings from Last 3 Encounters:  03/03/21 284 lb (128.8 kg)  02/05/21 281 lb 6.4 oz (127.6 kg)  02/04/21 280 lb (127 kg)     Studies/Labs Reviewed:   EKG:  EKG is not ordered today.  Recent Labs: 05/13/2020: TSH 3.035 07/09/2020: ALT 39 08/28/2020: B Natriuretic Peptide 100.9 01/11/2021: BUN 22; Creatinine, Ser 1.04; Potassium 4.3; Sodium 137 01/13/2021: Hemoglobin 12.6; Platelets 352   Lipid Panel    Component Value Date/Time   CHOL 162 05/13/2020 1005   TRIG 156 (H) 05/13/2020 1005   HDL 24 (L) 05/13/2020 1005   CHOLHDL 6.8 05/13/2020 1005   VLDL 31 05/13/2020 1005   LDLCALC 107 (H) 05/13/2020 1005   LDLCALC 107 (H) 12/18/2019 8527    Additional studies/ records that were reviewed today include:   Cardiac Catheterization: 05/2020 Widely patent/normal widely patent coronary arteries. Right dominant anatomy Regional wall motion abnormality noted on left ventriculography. EF 40 to 50%.   RECOMMENDATIONS:   Given context of clinical presentation, myopericarditis is now the leading diagnosis in the differential. This presentation is not atypical stress cardiomyopathy. Recommend MRI/cardiac.  Cardiac MRI: 12/2020 IMPRESSION: 1. Compared to prior cardiac MRI 05/15/20, T2 values have normalized and T1/ECV are significantly lower, consistent with resolution of myocarditis   2. Severe asymmetric LV hypertrophy measuring up to 8mm in basal septum (71mm in posterior wall), consistent with hypertrophic cardiomyopathy   3. Patchy late gadolinium enhancement primarily in septum, consistent with HCM. LGE accounts for 15% of total myocardial mass   4.  Small LV size with normal systolic function (EF 78%)   5.  Small RV size with normal systolic function (EF 24%)   6.  Dilated main pulmonary artery measuring 61mm  Assessment:    1. History of viral myocarditis   2. History of cardiomyopathy   3.  Hypertrophic cardiomyopathy (HCC)   4. Paroxysmal atrial fibrillation ([place])   5. Essential hypertension   6. Other iron deficiency anemia      Plan:   In order of problems listed above:  1. History of Myocarditis - This was initially diagnosed in 05/2020 and cardiac catheterization at  that time showed normal coronary arteries. Recent cardiac MRI in 12/2020 showed resolution of her myocarditis when compared to prior imaging. No indication for further treatment at this time  2. HFimpEF/Hypertrophic cardiomyopathy - Her EF was previously at 41% by cardiac MRI in 05/2020 and improved to 68% by most recent imaging. She does have baseline dyspnea on exertion but denies any orthopnea, PND or pitting edema. Appears euvolemic by examination today. Recent genetic testing was negative.  - Continue current medication regimen with Jardiance 10 mg daily, Toprol-XL 50 mg twice daily and Entresto 97-103 mg twice daily.  3. Paroxysmal Atrial Fibrillation - She does experience occasional palpitations but no persistent symptoms. Recent monitor showed occasional PAC's and PVC's along with brief SVT but no recurrent atrial fibrillation.  Continue Toprol-XL 50 mg twice daily for rate control. - She remains on Eliquis 5 mg twice daily for anticoagulation and denies any evidence of active bleeding. She has been receiving intermittent iron infusions given her low ferritin but hemoglobin was stable at 12.8 by most recent labs in 12/2020.  4. HTN - Her blood pressure is well controlled at 124/80 during today's visit. Continue current medication regimen.  5. Iron deficiency anemia - Her hemoglobin was at 12.8 in 12/2020 but Ferritin was low at 8 and she has been receiving iron infusions. Followed by Hematology.   Medication Adjustments/Labs and Tests Ordered: Current medicines are reviewed at length with the patient today.  Concerns regarding medicines are outlined above.  Medication changes, Labs and Tests  ordered today are listed in the Patient Instructions below. Patient Instructions  Medication Instructions:  Your physician recommends that you continue on your current medications as directed. Please refer to the Current Medication list given to you today.  *If you need a refill on your cardiac medications before your next appointment, please call your pharmacy*   Lab Work: NONE   If you have labs (blood work) drawn today and your tests are completely normal, you will receive your results only by: [place] (if you have MyChart) OR A paper copy in the mail If you have any lab test that is abnormal or we need to change your treatment, we will call you to review the results.   Testing/Procedures: NONE    Follow-Up: At [facility], you and your health needs are our priority.  As part of our continuing mission to provide you with exceptional heart care, we have created designated Provider Care Teams.  These Care Teams include your primary Cardiologist (physician) and Advanced Practice Providers (APPs -  Physician Assistants and Nurse Practitioners) who all work together to provide you with the care you need, when you need it.  We recommend signing up for the patient portal called "MyChart".  Sign up information is provided on this After Visit Summary.  MyChart is used to connect with patients for Virtual Visits (Telemedicine).  Patients are able to view lab/test results, encounter notes, upcoming appointments, etc.  Non-urgent messages can be sent to your provider as well.   To learn more about what you can do with MyChart, go to [email or web address].    Your next appointment:   3-4 month(s)  The format for your next appointment:   In Person  Provider:   Rozann Lesches, MD or Bernerd Pho, PA-C   Other Instructions Thank you for choosing [facility]!     Signed, Erma Heritage, PA-C  03/03/2021 5:05 PM    [address]  [place], [place] [address] Phone: [phone] Fax: [phone]

## 2021-03-03 ENCOUNTER — Encounter (HOSPITAL_COMMUNITY): Payer: Self-pay | Admitting: Hematology

## 2021-03-03 ENCOUNTER — Encounter: Payer: Self-pay | Admitting: Student

## 2021-03-03 ENCOUNTER — Ambulatory Visit: Payer: 59 | Admitting: Student

## 2021-03-03 ENCOUNTER — Other Ambulatory Visit: Payer: Self-pay

## 2021-03-03 VITALS — BP 124/80 | HR 90 | Ht 62.0 in | Wt 284.0 lb

## 2021-03-03 DIAGNOSIS — I10 Essential (primary) hypertension: Secondary | ICD-10-CM | POA: Diagnosis not present

## 2021-03-03 DIAGNOSIS — Z8679 Personal history of other diseases of the circulatory system: Secondary | ICD-10-CM

## 2021-03-03 DIAGNOSIS — I480 Paroxysmal atrial fibrillation: Secondary | ICD-10-CM

## 2021-03-03 DIAGNOSIS — I422 Other hypertrophic cardiomyopathy: Secondary | ICD-10-CM

## 2021-03-03 DIAGNOSIS — D508 Other iron deficiency anemias: Secondary | ICD-10-CM

## 2021-03-03 NOTE — Patient Instructions (Signed)
Medication Instructions:  Your physician recommends that you continue on your current medications as directed. Please refer to the Current Medication list given to you today.  *If you need a refill on your cardiac medications before your next appointment, please call your pharmacy*   Lab Work: NONE   If you have labs (blood work) drawn today and your tests are completely normal, you will receive your results only by: [place] (if you have MyChart) OR A paper copy in the mail If you have any lab test that is abnormal or we need to change your treatment, we will call you to review the results.   Testing/Procedures: NONE    Follow-Up: At [facility], you and your health needs are our priority.  As part of our continuing mission to provide you with exceptional heart care, we have created designated Provider Care Teams.  These Care Teams include your primary Cardiologist (physician) and Advanced Practice Providers (APPs -  Physician Assistants and Nurse Practitioners) who all work together to provide you with the care you need, when you need it.  We recommend signing up for the patient portal called "MyChart".  Sign up information is provided on this After Visit Summary.  MyChart is used to connect with patients for Virtual Visits (Telemedicine).  Patients are able to view lab/test results, encounter notes, upcoming appointments, etc.  Non-urgent messages can be sent to your provider as well.   To learn more about what you can do with MyChart, go to [email or web address].    Your next appointment:   3-4 month(s)  The format for your next appointment:   In Person  Provider:   Rozann Lesches, MD or Bernerd Pho, PA-C   Other Instructions Thank you for choosing [facility]!

## 2021-03-04 ENCOUNTER — Other Ambulatory Visit: Payer: Self-pay | Admitting: Internal Medicine

## 2021-03-04 DIAGNOSIS — K219 Gastro-esophageal reflux disease without esophagitis: Secondary | ICD-10-CM

## 2021-03-08 ENCOUNTER — Ambulatory Visit (HOSPITAL_COMMUNITY)
Admission: RE | Admit: 2021-03-08 | Discharge: 2021-03-08 | Disposition: A | Discharge status: Home / Self Care | Payer: 59 | Source: Ambulatory Visit | Attending: Internal Medicine | Admitting: Internal Medicine

## 2021-03-08 ENCOUNTER — Other Ambulatory Visit: Payer: Self-pay

## 2021-03-08 DIAGNOSIS — Z1231 Encounter for screening mammogram for malignant neoplasm of breast: Secondary | ICD-10-CM | POA: Insufficient documentation

## 2021-03-19 ENCOUNTER — Telehealth: Payer: Self-pay | Admitting: *Deleted

## 2021-03-19 NOTE — Telephone Encounter (Signed)
PA for Emgality started on covermymeds (key: [id number]). Pharmacy coverage through [facility] [phone]). QF#[id number]. Decision pending.

## 2021-03-20 ENCOUNTER — Other Ambulatory Visit: Payer: Self-pay | Admitting: Neurology

## 2021-03-20 ENCOUNTER — Other Ambulatory Visit: Payer: Self-pay | Admitting: Internal Medicine

## 2021-03-22 ENCOUNTER — Telehealth: Payer: Self-pay

## 2021-03-22 MED ORDER — EMPAGLIFLOZIN 10 MG PO TABS: 10.0000 mg | ORAL_TABLET | Freq: Every day | ORAL | 6 refills | Status: DC

## 2021-03-22 NOTE — Telephone Encounter (Signed)
PA [id number] approved through 09/16/2021.

## 2021-03-22 NOTE — Telephone Encounter (Signed)
Medication refill request for Jardiance 10 mg tablets approved and sent to [facility].

## 2021-04-07 ENCOUNTER — Other Ambulatory Visit: Payer: Self-pay | Admitting: Internal Medicine

## 2021-04-14 ENCOUNTER — Other Ambulatory Visit: Payer: Self-pay | Admitting: *Deleted

## 2021-04-14 MED ORDER — FUSION PLUS PO CAPS: 2.0000 | ORAL_CAPSULE | Freq: Every day | ORAL | 3 refills | Status: DC

## 2021-04-19 ENCOUNTER — Other Ambulatory Visit (HOSPITAL_COMMUNITY): Payer: Self-pay | Admitting: *Deleted

## 2021-04-19 ENCOUNTER — Telehealth (HOSPITAL_COMMUNITY): Payer: Self-pay | Admitting: *Deleted

## 2021-04-19 DIAGNOSIS — D509 Iron deficiency anemia, unspecified: Secondary | ICD-10-CM

## 2021-04-19 NOTE — Telephone Encounter (Signed)
Received tc from patient stating that she is experiencing extreme fatigue, to the degree she did previously when she was in need of iron replacement.  Appointment made to have labs drawn and will send to provider to treat accordingly.

## 2021-04-20 ENCOUNTER — Other Ambulatory Visit: Payer: Self-pay

## 2021-04-20 ENCOUNTER — Inpatient Hospital Stay (HOSPITAL_COMMUNITY): Payer: 59 | Attending: Hematology

## 2021-04-20 DIAGNOSIS — D509 Iron deficiency anemia, unspecified: Secondary | ICD-10-CM | POA: Insufficient documentation

## 2021-04-20 DIAGNOSIS — R591 Generalized enlarged lymph nodes: Secondary | ICD-10-CM | POA: Diagnosis not present

## 2021-04-20 LAB — CBC WITH DIFFERENTIAL/PLATELET
Abs Immature Granulocytes: 0.05 10*3/uL (ref 0.00–0.07)
Basophils Absolute: 0 10*3/uL (ref 0.0–0.1)
Basophils Relative: 0 %
Eosinophils Absolute: 0.2 10*3/uL (ref 0.0–0.5)
Eosinophils Relative: 2 %
HCT: 44.8 % (ref 36.0–46.0)
Hemoglobin: 13.1 g/dL (ref 12.0–15.0)
Immature Granulocytes: 1 %
Lymphocytes Relative: 21 %
Lymphs Abs: 2.1 10*3/uL (ref 0.7–4.0)
MCH: 21.8 pg — ABNORMAL LOW (ref 26.0–34.0)
MCHC: 29.2 g/dL — ABNORMAL LOW (ref 30.0–36.0)
MCV: 74.7 fL — ABNORMAL LOW (ref 80.0–100.0)
Monocytes Absolute: 0.7 10*3/uL (ref 0.1–1.0)
Monocytes Relative: 7 %
Neutro Abs: 7 10*3/uL (ref 1.7–7.7)
Neutrophils Relative %: 69 %
Platelets: 309 10*3/uL (ref 150–400)
RBC: 6 MIL/uL — ABNORMAL HIGH (ref 3.87–5.11)
RDW: 19.1 % — ABNORMAL HIGH (ref 11.5–15.5)
WBC: 10 10*3/uL (ref 4.0–10.5)
nRBC: 0 % (ref 0.0–0.2)

## 2021-04-20 LAB — IRON AND TIBC
Iron: 67 ug/dL (ref 28–170)
Saturation Ratios: 21 % (ref 10.4–31.8)
TIBC: 318 ug/dL (ref 250–450)
UIBC: 251 ug/dL

## 2021-04-20 LAB — FERRITIN: Ferritin: 115 ng/mL (ref 11–307)

## 2021-04-20 NOTE — Progress Notes (Signed)
Patient called and is aware.  All questions answered and verbalized understanding.

## 2021-04-20 NOTE — Progress Notes (Signed)
Patient called.  Left message for patient to call back.

## 2021-04-20 NOTE — Progress Notes (Signed)
No IV iron needed at this time.  Continue follow up with me as scheduled in April 2023.  If symptoms persist, patient should see PCP for other possible causes.

## 2021-05-13 ENCOUNTER — Ambulatory Visit (INDEPENDENT_AMBULATORY_CARE_PROVIDER_SITE_OTHER): Payer: 59 | Admitting: Internal Medicine

## 2021-05-13 ENCOUNTER — Other Ambulatory Visit: Payer: Self-pay

## 2021-05-13 ENCOUNTER — Encounter: Payer: Self-pay | Admitting: Internal Medicine

## 2021-05-13 VITALS — BP 128/84 | HR 98 | Resp 18 | Ht 62.0 in | Wt 284.0 lb

## 2021-05-13 DIAGNOSIS — Z0001 Encounter for general adult medical examination with abnormal findings: Secondary | ICD-10-CM | POA: Insufficient documentation

## 2021-05-13 DIAGNOSIS — G4733 Obstructive sleep apnea (adult) (pediatric): Secondary | ICD-10-CM

## 2021-05-13 DIAGNOSIS — K219 Gastro-esophageal reflux disease without esophagitis: Secondary | ICD-10-CM

## 2021-05-13 DIAGNOSIS — I480 Paroxysmal atrial fibrillation: Secondary | ICD-10-CM

## 2021-05-13 DIAGNOSIS — I10 Essential (primary) hypertension: Secondary | ICD-10-CM

## 2021-05-13 DIAGNOSIS — J0140 Acute pansinusitis, unspecified: Secondary | ICD-10-CM | POA: Diagnosis not present

## 2021-05-13 DIAGNOSIS — E1169 Type 2 diabetes mellitus with other specified complication: Secondary | ICD-10-CM

## 2021-05-13 DIAGNOSIS — E559 Vitamin D deficiency, unspecified: Secondary | ICD-10-CM

## 2021-05-13 MED ORDER — ESOMEPRAZOLE MAGNESIUM 40 MG PO CPDR: 40.0000 mg | DELAYED_RELEASE_CAPSULE | Freq: Two times a day (BID) | ORAL | 3 refills | Status: DC

## 2021-05-13 MED ORDER — FLUTICASONE PROPIONATE 50 MCG/ACT NA SUSP: 2.0000 | Freq: Every day | NASAL | 5 refills | Status: AC

## 2021-05-13 MED ORDER — AMOXICILLIN-POT CLAVULANATE 875-125 MG PO TABS
1.0000 | ORAL_TABLET | Freq: Two times a day (BID) | ORAL | 0 refills | Status: DC
Start: 2021-05-13 — End: 2021-11-10

## 2021-05-13 NOTE — Patient Instructions (Signed)
Please continue to take medications as prescribed.  Please continue to follow low carb diet and ambulate as tolerated.  Please get flu vaccine and Shingrix vaccine after you complete antibiotic.

## 2021-05-13 NOTE — Assessment & Plan Note (Signed)

## 2021-05-13 NOTE — Assessment & Plan Note (Addendum)
Persistent symptoms with Pantoprazole 40 mg BID Switched to Nexium If persistent, will refer to GI

## 2021-05-13 NOTE — Assessment & Plan Note (Signed)
BP Readings from Last 1 Encounters:  05/13/21 128/84   Well-controlled with Entresto and Metoprolol Counseled for compliance with the medications Advised DASH diet and moderate exercise/walking, at least 150 mins/week

## 2021-05-13 NOTE — Progress Notes (Signed)
Established Patient Office Visit  Subjective:  Patient ID: Monique Gomez, female    DOB: June 03, 1955  Age: 66 y.o. MRN: [id number]  CC:  Chief Complaint  Patient presents with   Annual Exam    Annual exam pt is still having trouble out of sinuses runny nose and nasal congestion does have appt with teoh on 1/24    HPI Monique Gomez is a 66 y.o. female with past medical history of paroxysmal atrial fibrillation, hypertension, myocarditis, OSA on CPAP, GERD, DM, HLD and morbid obesity who presents for annual physical.  She has chronic nasal congestion, but has been worse for the last 2 weeks.  She also reports postnasal drip and cough.  She had been using Flonase for nasal congestion, but has not used it this week.  She denies any fever, chills, dyspnea or wheezing currently.  BP is well-controlled. Takes medications regularly. Patient denies headache, dizziness, chest pain, dyspnea or palpitations.  She has history of DM, for which she takes metformin.  She has started taking Jardiance for history of myocarditis/CHF now.  She denies any fatigue, polyuria or polydipsia currently.  She uses CPAP for OSA, but has not been able to use it recently due to sinusitis.  She complains of persistent acid reflux despite taking pantoprazole twice daily.  Denies any dysphagia or odynophagia.  Past Medical History:  Diagnosis Date   Abdominal fibromatosis    ACS (acute coronary syndrome) ([place])    Acute viral myocarditis 09/22/2020   Allergic rhinitis    Anemia    Arrhythmia    Asthma    Essential hypertension    GERD (gastroesophageal reflux disease)    Headache(784.0)    History of cardiac catheterization    No significant CAD May 2015   History of migraine headaches    Hyperlipidemia    Iron deficiency anemia 02/05/2021   Migraines    Myocarditis ([place])    a. diagnosed in 05/2020 with cath showing normal cors   Obesity    PAF (paroxysmal atrial fibrillation) ([place]) 08/2013    Sleep apnea    Type 2 diabetes mellitus [facility])     Past Surgical History:  Procedure Laterality Date   CATARACT EXTRACTION     CATARACT EXTRACTION W/PHACO Right 04/05/2019   Procedure: CATARACT EXTRACTION PHACO AND INTRAOCULAR LENS PLACEMENT RIGHT EYE (CDE: 3.19);  Surgeon: Baruch Goldmann, MD;  Location: [facility];  Service: Ophthalmology;  Laterality: Right;   COLONOSCOPY N/A 02/02/2015   SLF: 1. one colon polyp removed-no source for anemia identified. 2. moderate diverticulosis noted in the sigmoid colon and descending colon 3. the left colon is redundant 4. Rectal bleeding due ot small internal hemorroids 5. Moderate sized external hemorrhoids.   ESOPHAGOGASTRODUODENOSCOPY N/A 02/02/2015   SLF: 1. 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 large hiatal hernia 3. mild non-erosive gastritis 4. No source for anemia identified.    LEFT HEART CATH AND CORONARY ANGIOGRAPHY N/A 05/14/2020   Procedure: LEFT HEART CATH AND CORONARY ANGIOGRAPHY;  Surgeon: Belva Crome, MD;  Location: [place] CV LAB;  Service: Cardiovascular;  Laterality: N/A;   LEFT HEART CATHETERIZATION WITH CORONARY ANGIOGRAM N/A 09/03/2013   Procedure: LEFT HEART CATHETERIZATION WITH CORONARY ANGIOGRAM;  Surgeon: Jettie Booze, MD;  Location[facility] CATH LAB;  Service: Cardiovascular;  Laterality: N/A;   Right carpal tunnel release      Family History  Problem Relation Age of Onset   Diabetes Mother    Hypertension Mother  Heart disease Mother    High Cholesterol Mother    Alzheimer's disease Father    Hypertension Sister    Hypertension Brother    Diabetes Brother    Arthritis Sister    Hypertension Sister    High Cholesterol Sister    Diabetes Son    Developmental delay Son    Colon cancer Neg Hx     Social History   Socioeconomic History   Marital status: Single    Spouse name: Not on file   Number of children: 2   Years of education: Not on file   Highest education level: Not on file   Occupational History   Occupation: Education officer, museum    Employer: [employer]  Tobacco Use   Smoking status: Former    Packs/day: 0.30    Types: Cigarettes    Quit date: 04/01/2012    Years since quitting: 9.1   Smokeless tobacco: Never  Vaping Use   Vaping Use: Never used  Substance and Sexual Activity   Alcohol use: Not Currently    Alcohol/week: 0.0 standard drinks    Comment: very occasional   Drug use: No   Sexual activity: Yes  Other Topics Concern   Not on file  Social History Narrative   Not on file   Social Determinants of Health   Financial Resource Strain: Not on file  Food Insecurity: Not on file  Transportation Needs: Not on file  Physical Activity: Not on file  Stress: Not on file  Social Connections: Not on file  Intimate Partner Violence: Not on file    Outpatient Medications Prior to Visit  Medication Sig Dispense Refill   acetaminophen (TYLENOL) 500 MG tablet Take 1 tablet (500 mg total) by mouth every 6 (six) hours as needed. 30 tablet 0   atorvastatin (LIPITOR) 80 MG tablet Take 1 tablet by mouth once daily 90 tablet 0   ELIQUIS 5 MG TABS tablet Take 1 tablet by mouth twice daily 180 tablet 0   empagliflozin (JARDIANCE) 10 MG TABS tablet Take 1 tablet (10 mg total) by mouth daily before breakfast. 30 tablet 6   Galcanezumab-gnlm (EMGALITY) 120 MG/ML SOAJ Inject 120 mg into the skin every 30 (thirty) days. 1.12 mL 11   ipratropium (ATROVENT) 0.03 % nasal spray Place 2 sprays into both nostrils every 12 (twelve) hours. 30 mL 3   Iron-FA-B Cmp-C-Biot-Probiotic (FUSION PLUS) CAPS Take 2 tablets by mouth daily. 60 capsule 3   meclizine (ANTIVERT) 25 MG tablet Take 1 tablet (25 mg total) by mouth 3 (three) times daily as needed for dizziness. 30 tablet 0   metFORMIN (GLUCOPHAGE) 500 MG tablet TAKE 1 TABLET BY MOUTH TWICE DAILY WITH A MEAL 180 tablet 0   metoCLOPramide (REGLAN) 10 MG tablet Take 1 tablet (10 mg total) by mouth every 8 (eight) hours as  needed for up to 7 days for nausea (Headache). 21 tablet 0   sacubitril-valsartan (ENTRESTO) 97-103 MG Take 1 tablet by mouth 2 (two) times daily. 180 tablet 3   topiramate (TOPAMAX) 50 MG tablet TAKE 1 TABLET BY MOUTH AT BEDTIME 135 tablet 0   Ubrogepant (UBRELVY) 50 MG TABS Take 1 tablet at the onset of a migraine.  Can repeat in 2 hours if needed. 15 tablet 5   fluticasone (FLONASE) 50 MCG/ACT nasal spray Place 2 sprays into both nostrils daily.     pantoprazole (PROTONIX) 40 MG tablet Take 1 tablet by mouth twice daily 180 tablet 0   metoprolol  succinate (TOPROL-XL) 50 MG 24 hr tablet Take 1 tablet (50 mg total) by mouth in the morning and at bedtime. Take with or immediately following a meal. 180 tablet 3   No facility-administered medications prior to visit.    Allergies  Allergen Reactions   Venlafaxine Nausea And Vomiting and Other (See Comments)    Dizziness, shakiness    ROS Review of Systems  Constitutional:  Negative for chills and fever.  HENT:  Positive for congestion and sinus pain. Negative for sore throat.   Eyes:  Negative for pain and discharge.  Respiratory:  Negative for cough and shortness of breath.   Cardiovascular:  Negative for chest pain and palpitations.  Gastrointestinal:  Negative for abdominal pain, diarrhea, nausea and vomiting.  Endocrine: Negative for polydipsia and polyuria.  Genitourinary:  Negative for dysuria and hematuria.  Musculoskeletal:  Positive for back pain. Negative for neck pain and neck stiffness.  Skin:  Negative for rash.  Neurological:  Positive for dizziness. Negative for weakness.  Psychiatric/Behavioral:  Negative for agitation and behavioral problems.      Objective:    Physical Exam Vitals reviewed.  Constitutional:      General: She is not in acute distress.    Appearance: She is obese. She is not diaphoretic.  HENT:     Head: Normocephalic and atraumatic.     Nose: Congestion present.     Mouth/Throat:     Mouth:  Mucous membranes are moist.  Eyes:     General: No scleral icterus.    Extraocular Movements: Extraocular movements intact.  Cardiovascular:     Rate and Rhythm: Normal rate and regular rhythm.     Pulses: Normal pulses.     Heart sounds: Normal heart sounds. No murmur heard. Pulmonary:     Breath sounds: Normal breath sounds. No wheezing or rales.  Abdominal:     Palpations: Abdomen is soft.     Tenderness: There is no abdominal tenderness.  Musculoskeletal:     Cervical back: Neck supple. No tenderness.     Right lower leg: No edema.     Left lower leg: No edema.  Skin:    General: Skin is warm.     Findings: Erythema (Chronic, over right side of face) present. No rash.  Neurological:     General: No focal deficit present.     Mental Status: She is alert and oriented to person, place, and time.     Cranial Nerves: No cranial nerve deficit.     Sensory: No sensory deficit.     Motor: No weakness.  Psychiatric:        Mood and Affect: Mood normal.        Behavior: Behavior normal.    BP 128/84 (BP Location: Left Arm, Patient Position: Sitting, Cuff Size: Normal)    Pulse 98    Resp 18    Ht '5\' 2"'  (1.575 m)    Wt 284 lb (128.8 kg)    SpO2 98%    BMI 51.94 kg/m  Wt Readings from Last 3 Encounters:  05/13/21 284 lb (128.8 kg)  03/03/21 284 lb (128.8 kg)  02/05/21 281 lb 6.4 oz (127.6 kg)    Lab Results  Component Value Date   TSH 3.035 05/13/2020   Lab Results  Component Value Date   WBC 10.0 04/20/2021   HGB 13.1 04/20/2021   HCT 44.8 04/20/2021   MCV 74.7 (L) 04/20/2021   PLT 309 04/20/2021   Lab Results  Component  Value Date   NA 137 01/11/2021   K 4.3 01/11/2021   CO2 25 01/11/2021   GLUCOSE 141 (H) 01/11/2021   BUN 22 01/11/2021   CREATININE 1.04 (H) 01/11/2021   BILITOT 0.4 07/09/2020   ALKPHOS 70 07/09/2020   AST 24 07/09/2020   ALT 39 07/09/2020   PROT 7.4 07/09/2020   ALBUMIN 3.8 07/09/2020   CALCIUM 9.6 01/11/2021   ANIONGAP 8 01/11/2021    Lab Results  Component Value Date   CHOL 162 05/13/2020   Lab Results  Component Value Date   HDL 24 (L) 05/13/2020   Lab Results  Component Value Date   LDLCALC 107 (H) 05/13/2020   Lab Results  Component Value Date   TRIG 156 (H) 05/13/2020   Lab Results  Component Value Date   CHOLHDL 6.8 05/13/2020   Lab Results  Component Value Date   HGBA1C 6.4 (H) 05/13/2020      Assessment & Plan:   Problem List Items Addressed This Visit       Cardiovascular and Mediastinum   HTN (hypertension)    BP Readings from Last 1 Encounters:  05/13/21 128/84  Well-controlled with Entresto and Metoprolol Counseled for compliance with the medications Advised DASH diet and moderate exercise/walking, at least 150 mins/week      PAF (paroxysmal atrial fibrillation) (HCC)    Currently in sinus rhythm On Toprol and Eliquis Follows up with Cardiologist        Respiratory   Obstructive sleep apnea    Uses CPAP      Acute pansinusitis    Uses Flonase Has recent worsening of symptoms for last 2 weeks Started Augmentin Nasal saline spray as needed Follows up with Dr. Benjamine Mola      Relevant Medications   amoxicillin-clavulanate (AUGMENTIN) 875-125 MG tablet   fluticasone (FLONASE) 50 MCG/ACT nasal spray     Digestive   GERD (gastroesophageal reflux disease)    Persistent symptoms with Pantoprazole 40 mg BID Switched to Nexium If persistent, will refer to GI      Relevant Medications   esomeprazole (NEXIUM) 40 MG capsule     Endocrine   DM (diabetes mellitus) ([place])    Lab Results  Component Value Date   HGBA1C 6.4 (H) 05/13/2020  On Metformin and Jardiance On statin Diabetic foot exam: Today Has an Ophthalmologist      Relevant Orders   Hemoglobin A1c   CMP14+EGFR     Other   Vitamin D deficiency    Last vitamin D Lab Results  Component Value Date   VD25OH 28 (L) 12/18/2019  Was on vitamin D supplement Check vitamin D level      Relevant Orders    VITAMIN D 25 Hydroxy (Vit-D Deficiency, Fractures)   Encounter for general adult medical examination with abnormal findings - Primary    Physical exam as documented. Counseling done  re healthy lifestyle involving commitment to 150 minutes exercise per week, heart healthy diet, and attaining healthy weight.The importance of adequate sleep also discussed. Changes in health habits are decided on by the patient with goals and time frames  set for achieving them. Immunization and cancer screening needs are specifically addressed at this visit.       Relevant Orders   TSH   Lipid panel   CMP14+EGFR   CBC with Differential/Platelet    Meds ordered this encounter  Medications   amoxicillin-clavulanate (AUGMENTIN) 875-125 MG tablet    Sig: Take 1 tablet by mouth 2 (two) times  daily.    Dispense:  14 tablet    Refill:  0   fluticasone (FLONASE) 50 MCG/ACT nasal spray    Sig: Place 2 sprays into both nostrils daily.    Dispense:  16 g    Refill:  5   esomeprazole (NEXIUM) 40 MG capsule    Sig: Take 1 capsule (40 mg total) by mouth 2 (two) times daily before a meal.    Dispense:  60 capsule    Refill:  3    Follow-up: Return in about 6 months (around 11/10/2021) for DM and HTN.    Lindell Spar, MD

## 2021-05-13 NOTE — Assessment & Plan Note (Signed)
Uses CPAP 

## 2021-05-13 NOTE — Assessment & Plan Note (Signed)
Uses Flonase Has recent worsening of symptoms for last 2 weeks Started Augmentin Nasal saline spray as needed Follows up with Dr. Benjamine Mola

## 2021-05-13 NOTE — Assessment & Plan Note (Signed)
Lab Results  Component Value Date   HGBA1C 6.4 (H) 05/13/2020   On Metformin and Jardiance On statin Diabetic foot exam: Today Has an Ophthalmologist

## 2021-05-13 NOTE — Assessment & Plan Note (Signed)
Currently in sinus rhythm On Toprol and Eliquis Follows up with Cardiologist 

## 2021-05-13 NOTE — Assessment & Plan Note (Signed)
Last vitamin D Lab Results  Component Value Date   VD25OH 28 (L) 12/18/2019   Was on vitamin D supplement Check vitamin D level

## 2021-05-14 LAB — CMP14+EGFR
ALT: 14 IU/L (ref 0–32)
AST: 16 IU/L (ref 0–40)
Albumin/Globulin Ratio: 1.4 (ref 1.2–2.2)
Albumin: 4.1 g/dL (ref 3.8–4.8)
Alkaline Phosphatase: 100 IU/L (ref 44–121)
BUN/Creatinine Ratio: 15 (ref 12–28)
BUN: 13 mg/dL (ref 8–27)
Bilirubin Total: <0.2 mg/dL (ref 0.0–1.2)
CO2: 23 mmol/L (ref 20–29)
Calcium: 10.1 mg/dL (ref 8.7–10.3)
Chloride: 108 mmol/L — ABNORMAL HIGH (ref 96–106)
Creatinine, Ser: 0.86 mg/dL (ref 0.57–1.00)
Globulin, Total: 3 g/dL (ref 1.5–4.5)
Glucose: 124 mg/dL — ABNORMAL HIGH (ref 70–99)
Potassium: 4.2 mmol/L (ref 3.5–5.2)
Sodium: 146 mmol/L — ABNORMAL HIGH (ref 134–144)
Total Protein: 7.1 g/dL (ref 6.0–8.5)
eGFR: 75 mL/min/{1.73_m2} (ref >59)

## 2021-05-14 LAB — CBC WITH DIFFERENTIAL/PLATELET
Basophils Absolute: 0 10*3/uL (ref 0.0–0.2)
Basos: 1 %
EOS (ABSOLUTE): 0.2 10*3/uL (ref 0.0–0.4)
Eos: 2 %
Hematocrit: 44.8 % (ref 34.0–46.6)
Hemoglobin: 13.4 g/dL (ref 11.1–15.9)
Immature Grans (Abs): 0 10*3/uL (ref 0.0–0.1)
Immature Granulocytes: 0 %
Lymphocytes Absolute: 2.1 10*3/uL (ref 0.7–3.1)
Lymphs: 24 %
MCH: 21.8 pg — ABNORMAL LOW (ref 26.6–33.0)
MCHC: 29.9 g/dL — ABNORMAL LOW (ref 31.5–35.7)
MCV: 73 fL — ABNORMAL LOW (ref 79–97)
Monocytes Absolute: 0.6 10*3/uL (ref 0.1–0.9)
Monocytes: 7 %
Neutrophils Absolute: 5.7 10*3/uL (ref 1.4–7.0)
Neutrophils: 66 %
Platelets: 345 10*3/uL (ref 150–450)
RBC: 6.15 x10E6/uL — ABNORMAL HIGH (ref 3.77–5.28)
RDW: 16.3 % — ABNORMAL HIGH (ref 11.7–15.4)
WBC: 8.6 10*3/uL (ref 3.4–10.8)

## 2021-05-14 LAB — VITAMIN D 25 HYDROXY (VIT D DEFICIENCY, FRACTURES): Vit D, 25-Hydroxy: 40.6 ng/mL (ref 30.0–100.0)

## 2021-05-14 LAB — LIPID PANEL
Chol/HDL Ratio: 4.2 ratio (ref 0.0–4.4)
Cholesterol, Total: 174 mg/dL (ref 100–199)
HDL: 41 mg/dL (ref >39)
LDL Chol Calc (NIH): 114 mg/dL — ABNORMAL HIGH (ref 0–99)
Triglycerides: 101 mg/dL (ref 0–149)
VLDL Cholesterol Cal: 19 mg/dL (ref 5–40)

## 2021-05-14 LAB — TSH: TSH: 2.59 u[IU]/mL (ref 0.450–4.500)

## 2021-05-14 LAB — HEMOGLOBIN A1C
Est. average glucose Bld gHb Est-mCnc: 137 mg/dL
Hgb A1c MFr Bld: 6.4 % — ABNORMAL HIGH (ref 4.8–5.6)

## 2021-05-19 ENCOUNTER — Other Ambulatory Visit: Payer: Self-pay | Admitting: Internal Medicine

## 2021-06-07 ENCOUNTER — Other Ambulatory Visit: Payer: Self-pay | Admitting: Adult Health

## 2021-06-08 ENCOUNTER — Telehealth: Payer: Self-pay

## 2021-06-08 NOTE — Telephone Encounter (Signed)
I have submitted a PA for Ubrelvy 50mg  on CMM, Key: [id number] - PA Case ID: [id number]. Awaiting determination from [employer].

## 2021-06-09 ENCOUNTER — Ambulatory Visit (INDEPENDENT_AMBULATORY_CARE_PROVIDER_SITE_OTHER): Payer: 59 | Admitting: Cardiology

## 2021-06-09 ENCOUNTER — Other Ambulatory Visit: Payer: Self-pay

## 2021-06-09 ENCOUNTER — Encounter: Payer: Self-pay | Admitting: Cardiology

## 2021-06-09 VITALS — BP 124/84 | HR 86 | Ht 62.0 in | Wt 287.0 lb

## 2021-06-09 DIAGNOSIS — Z8679 Personal history of other diseases of the circulatory system: Secondary | ICD-10-CM | POA: Diagnosis not present

## 2021-06-09 DIAGNOSIS — I480 Paroxysmal atrial fibrillation: Secondary | ICD-10-CM | POA: Diagnosis not present

## 2021-06-09 DIAGNOSIS — I422 Other hypertrophic cardiomyopathy: Secondary | ICD-10-CM

## 2021-06-09 DIAGNOSIS — I10 Essential (primary) hypertension: Secondary | ICD-10-CM | POA: Diagnosis not present

## 2021-06-09 NOTE — Telephone Encounter (Signed)
PA for Monique Gomez has been approved. Approval dates 06/08/21 06/08/22.

## 2021-06-09 NOTE — Progress Notes (Signed)
Cardiology Office Note  Date: 06/09/2021   ID: [name], [name] October 11, 1955, MRN [id number]  PCP:  Lindell Spar, MD  Cardiologist:  Rozann Lesches, MD Electrophysiologist:  None   Chief Complaint  Patient presents with   Cardiac follow-up    History of Present Illness: Monique Gomez is a 66 y.o. female last seen in November 2022.  She is here for a follow-up visit.  Overall doing reasonably well from a cardiac perspective, NYHA class II dyspnea, no palpitations or syncope.  I reviewed her medications which are outlined below.  She remains on [place], Toprol-XL, [place], Eliquis, and Lipitor.  Follow-up cardiac MRI in September 2022 was consistent with resolution of previous myocarditis.  She does have severe asymmetric basal septal hypertrophy at 21 mm as opposed to 13 mm in the posterior wall and patchy LGE pattern in the septum consistent with hypertrophic cardiomyopathy.   Past Medical History:  Diagnosis Date   Abdominal fibromatosis    Allergic rhinitis    Anemia    Asthma    Essential hypertension    GERD (gastroesophageal reflux disease)    History of cardiac catheterization    No significant CAD May 2015   History of migraine headaches    Hyperlipidemia    Iron deficiency anemia 02/05/2021   Myocarditis ([place])    a. diagnosed in 05/2020 with cath showing normal cors   Obesity    PAF (paroxysmal atrial fibrillation) ([place]) 08/2013   Sleep apnea    Type 2 diabetes mellitus [facility])     Past Surgical History:  Procedure Laterality Date   CATARACT EXTRACTION     CATARACT EXTRACTION W/PHACO Right 04/05/2019   Procedure: CATARACT EXTRACTION PHACO AND INTRAOCULAR LENS PLACEMENT RIGHT EYE (CDE: 3.19);  Surgeon: Baruch Goldmann, MD;  Location: [facility];  Service: Ophthalmology;  Laterality: Right;   COLONOSCOPY N/A 02/02/2015   SLF: 1. one colon polyp removed-no source for anemia identified. 2. moderate diverticulosis noted in the sigmoid colon and descending  colon 3. the left colon is redundant 4. Rectal bleeding due ot small internal hemorroids 5. Moderate sized external hemorrhoids.   ESOPHAGOGASTRODUODENOSCOPY N/A 02/02/2015   SLF: 1. 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 large hiatal hernia 3. mild non-erosive gastritis 4. No source for anemia identified.    LEFT HEART CATH AND CORONARY ANGIOGRAPHY N/A 05/14/2020   Procedure: LEFT HEART CATH AND CORONARY ANGIOGRAPHY;  Surgeon: Belva Crome, MD;  Location: [place] CV LAB;  Service: Cardiovascular;  Laterality: N/A;   LEFT HEART CATHETERIZATION WITH CORONARY ANGIOGRAM N/A 09/03/2013   Procedure: LEFT HEART CATHETERIZATION WITH CORONARY ANGIOGRAM;  Surgeon: Jettie Booze, MD;  Location: [facility] CATH LAB;  Service: Cardiovascular;  Laterality: N/A;   Right carpal tunnel release      Current Outpatient Medications  Medication Sig Dispense Refill   acetaminophen (TYLENOL) 500 MG tablet Take 1 tablet (500 mg total) by mouth every 6 (six) hours as needed. 30 tablet 0   atorvastatin (LIPITOR) 80 MG tablet Take 1 tablet by mouth once daily 90 tablet 0   ELIQUIS 5 MG TABS tablet Take 1 tablet by mouth twice daily 180 tablet 0   empagliflozin (JARDIANCE) 10 MG TABS tablet Take 1 tablet (10 mg total) by mouth daily before breakfast. 30 tablet 6   esomeprazole (NEXIUM) 40 MG capsule Take 1 capsule (40 mg total) by mouth 2 (two) times daily before a meal. 60 capsule 3   fluticasone (FLONASE) 50 MCG/ACT nasal  spray Place 2 sprays into both nostrils daily. 16 g 5   Galcanezumab-gnlm (EMGALITY) 120 MG/ML SOAJ Inject 120 mg into the skin every 30 (thirty) days. 1.12 mL 11   ipratropium (ATROVENT) 0.03 % nasal spray Place 2 sprays into both nostrils every 12 (twelve) hours. 30 mL 3   Iron-FA-B Cmp-C-Biot-Probiotic (FUSION PLUS) CAPS Take 2 tablets by mouth daily. 60 capsule 3   meclizine (ANTIVERT) 25 MG tablet Take 1 tablet (25 mg total) by mouth 3 (three) times daily as needed for dizziness. 30  tablet 0   metFORMIN (GLUCOPHAGE) 500 MG tablet TAKE 1 TABLET BY MOUTH TWICE DAILY WITH A MEAL 180 tablet 0   metoCLOPramide (REGLAN) 10 MG tablet Take 1 tablet (10 mg total) by mouth every 8 (eight) hours as needed for up to 7 days for nausea (Headache). 21 tablet 0   metoprolol succinate (TOPROL-XL) 50 MG 24 hr tablet Take 1 tablet (50 mg total) by mouth in the morning and at bedtime. Take with or immediately following a meal. 180 tablet 3   sacubitril-valsartan (ENTRESTO) 97-103 MG Take 1 tablet by mouth 2 (two) times daily. 180 tablet 3   topiramate (TOPAMAX) 50 MG tablet TAKE 1 TABLET BY MOUTH AT BEDTIME 135 tablet 0   Ubrogepant (UBRELVY) 50 MG TABS TAKE ONE TABLET BY MOUTH AT THE ONSET OF A MIGRAINE. CAN REPEAT IN 2 HOURS IF NEEDED 10 tablet 7   amoxicillin-clavulanate (AUGMENTIN) 875-125 MG tablet Take 1 tablet by mouth 2 (two) times daily. (Patient not taking: Reported on 06/09/2021) 14 tablet 0   No current facility-administered medications for this visit.   Allergies:  Venlafaxine   ROS: No palpitations or syncope, no orthopnea or PND.  She has been having a lot of trouble with her sinuses, following with ENT.  Physical Exam: VS:  BP 124/84    Pulse 86    Ht 5\' 2"  (1.575 m)    Wt 287 lb (130.2 kg)    SpO2 96%    BMI 52.49 kg/m , BMI Body mass index is 52.49 kg/m.  Wt Readings from Last 3 Encounters:  06/09/21 287 lb (130.2 kg)  05/13/21 284 lb (128.8 kg)  03/03/21 284 lb (128.8 kg)    General: Patient appears comfortable at rest. HEENT: Conjunctiva and lids normal, wearing a mask. Neck: Supple, no elevated JVP or carotid bruits, no thyromegaly. Lungs: Clear to auscultation, nonlabored breathing at rest. Cardiac: Regular rate and rhythm, no S3, 2/6 systolic murmur, no pericardial rub. Extremities: No pitting edema.  ECG:  An ECG dated 10/21/2020 was personally reviewed today and demonstrated:  Sinus rhythm with right bundle branch block.  Recent Labwork: 08/28/2020: B  Natriuretic Peptide 100.9 05/13/2021: ALT 14; AST 16; BUN 13; Creatinine, Ser 0.86; Hemoglobin 13.4; Platelets 345; Potassium 4.2; Sodium 146; TSH 2.590     Component Value Date/Time   CHOL 174 05/13/2021 0848   TRIG 101 05/13/2021 0848   HDL 41 05/13/2021 0848   CHOLHDL 4.2 05/13/2021 0848   CHOLHDL 6.8 05/13/2020 1005   VLDL 31 05/13/2020 1005   LDLCALC 114 (H) 05/13/2021 0848   LDLCALC 107 (H) 12/18/2019 0822    Other Studies Reviewed Today:  Echocardiogram 06/23/2020:  1. Limited study.   2. Left ventricular ejection fraction, by estimation, is approximately  55%. The left ventricle has normal function. The left ventricle has no  regional wall motion abnormalities. There is severe asymmetric left  ventricular hypertrophy of the basal and  septal segments, otherwise moderate.  3. Right ventricular systolic function is normal. The right ventricular  size is normal.   4. There is a trivial pericardial effusion anterior to the right  ventricle. This has decreased in size in comparison to the prior study in  January 2022.   5. The mitral valve is grossly normal. Mild mitral valve regurgitation.   6. The inferior vena cava is normal in size with greater than 50%  respiratory variability, suggesting right atrial pressure of 3 mmHg.   Cardiac MRI 01/12/2021: IMPRESSION: 1. Compared to prior cardiac MRI 05/15/20, T2 values have normalized and T1/ECV are significantly lower, consistent with resolution of myocarditis   2. Severe asymmetric LV hypertrophy measuring up to 67mm in basal septum (43mm in posterior wall), consistent with hypertrophic cardiomyopathy   3. Patchy late gadolinium enhancement primarily in septum, consistent with HCM. LGE accounts for 15% of total myocardial mass   4.  Small LV size with normal systolic function (EF 75%)   5.  Small RV size with normal systolic function (EF 17%)   6.  Dilated main pulmonary artery measuring 76mm  Assessment and  Plan:  1.  Hypertrophic cardiomyopathy, symptomatically stable at this time with NYHA class II dyspnea, no palpitations or syncope.  Cardiac MRI results from September 2022 are noted above.  She is on Toprol-XL.  2. HFrecEF with history of viral myocarditis and improvement in LVEF to normal range.  She is on Toprol-XL, [place], [place].  3.  Paroxysmal atrial fibrillation.  She is on Eliquis for stroke prophylaxis and reports no active palpitations.  I reviewed her most recent lab work as noted above.  Hemoglobin and renal function normal range.  4.  Essential hypertension, blood pressure is well controlled today.  Medication Adjustments/Labs and Tests Ordered: Current medicines are reviewed at length with the patient today.  Concerns regarding medicines are outlined above.   Tests Ordered: No orders of the defined types were placed in this encounter.   Medication Changes: No orders of the defined types were placed in this encounter.   Disposition:  Follow up  6 months.  Signed, Monique Sark, MD, [facility] 06/09/2021 3:33 PM    [place] [facility] [address] [place], [place] [address] Phone: [phone]; Fax: [phone]

## 2021-06-09 NOTE — Patient Instructions (Signed)
Medication Instructions:  Your physician recommends that you continue on your current medications as directed. Please refer to the Current Medication list given to you today.  *If you need a refill on your cardiac medications before your next appointment, please call your pharmacy*   Lab Work: NONE   If you have labs (blood work) drawn today and your tests are completely normal, you will receive your results only by: . MyChart Message (if you have MyChart) OR . A paper copy in the mail If you have any lab test that is abnormal or we need to change your treatment, we will call you to review the results.   Testing/Procedures: NONE    Follow-Up: At [facility], you and your health needs are our priority.  As part of our continuing mission to provide you with exceptional heart care, we have created designated Provider Care Teams.  These Care Teams include your primary Cardiologist (physician) and Advanced Practice Providers (APPs -  Physician Assistants and Nurse Practitioners) who all work together to provide you with the care you need, when you need it.  We recommend signing up for the patient portal called "MyChart".  Sign up information is provided on this After Visit Summary.  MyChart is used to connect with patients for Virtual Visits (Telemedicine).  Patients are able to view lab/test results, encounter notes, upcoming appointments, etc.  Non-urgent messages can be sent to your provider as well.   To learn more about what you can do with MyChart, go to [email or web address].    Your next appointment:   6 month(s)  The format for your next appointment:   In Person  Provider:   Samuel McDowell, MD   Other Instructions Thank you for choosing [facility]!    

## 2021-06-14 ENCOUNTER — Other Ambulatory Visit: Payer: Self-pay | Admitting: Cardiology

## 2021-06-15 ENCOUNTER — Other Ambulatory Visit: Payer: Self-pay | Admitting: Internal Medicine

## 2021-06-15 DIAGNOSIS — K219 Gastro-esophageal reflux disease without esophagitis: Secondary | ICD-10-CM

## 2021-06-15 MED ORDER — LANSOPRAZOLE 30 MG PO CPDR: 30.0000 mg | DELAYED_RELEASE_CAPSULE | Freq: Two times a day (BID) | ORAL | 5 refills | Status: DC

## 2021-06-17 ENCOUNTER — Telehealth: Payer: Self-pay

## 2021-06-17 NOTE — Telephone Encounter (Signed)
Patient called said her insurance will not cover this medicine esomeprazole (NEXIUM) 40 MG capsule and needs something for her heartburn.    Pharmacy: [facility]

## 2021-06-17 NOTE — Telephone Encounter (Signed)
Pt advised with verbal understanding  °

## 2021-06-18 ENCOUNTER — Other Ambulatory Visit: Payer: Self-pay | Admitting: Cardiology

## 2021-06-18 ENCOUNTER — Other Ambulatory Visit: Payer: Self-pay | Admitting: Internal Medicine

## 2021-06-21 ENCOUNTER — Telehealth: Payer: Self-pay

## 2021-06-21 NOTE — Telephone Encounter (Signed)
error 

## 2021-06-25 ENCOUNTER — Other Ambulatory Visit: Payer: Self-pay | Admitting: Cardiology

## 2021-06-28 ENCOUNTER — Other Ambulatory Visit: Payer: Self-pay | Admitting: *Deleted

## 2021-06-28 ENCOUNTER — Telehealth: Payer: Self-pay | Admitting: Adult Health

## 2021-06-28 NOTE — Telephone Encounter (Signed)
I spoke to the patient. She has been having more headaches recently. Also, increased problems with her chronic sinusitis. History of doing well with Emgality. She has only tried [place] once. She understands that she may repeat the Ubrelvy (max of 2/day or 10/month). She can use Tylenol sparingly. Plans to contact ENT for current sinus symptoms. Weather changes my be playing a role. She will call back if headache frequency continues to increase.

## 2021-06-28 NOTE — Telephone Encounter (Signed)
Pt having migraines for 2 wks, took Galcanezumab-gnlm [facility]) 120 MG/ML SOAJon the 2/23, took Ubrogepant (UBRELVY) 50 MG TABStoday. Having a pounding in the top of the head. Would like a call from the nurse to discuss a sooner appt with Judson Roch.

## 2021-07-06 LAB — HM DIABETES EYE EXAM

## 2021-07-08 ENCOUNTER — Encounter: Payer: Self-pay | Admitting: *Deleted

## 2021-07-10 ENCOUNTER — Other Ambulatory Visit: Payer: Self-pay | Admitting: Internal Medicine

## 2021-07-26 ENCOUNTER — Other Ambulatory Visit: Payer: Self-pay | Admitting: Neurology

## 2021-08-05 ENCOUNTER — Inpatient Hospital Stay (HOSPITAL_COMMUNITY): Payer: 59 | Attending: Hematology

## 2021-08-09 ENCOUNTER — Inpatient Hospital Stay (HOSPITAL_COMMUNITY): Payer: 59 | Attending: Hematology

## 2021-08-09 ENCOUNTER — Other Ambulatory Visit (HOSPITAL_COMMUNITY): Payer: Self-pay | Admitting: Otolaryngology

## 2021-08-09 ENCOUNTER — Other Ambulatory Visit: Payer: Self-pay | Admitting: Otolaryngology

## 2021-08-09 DIAGNOSIS — D508 Other iron deficiency anemias: Secondary | ICD-10-CM

## 2021-08-09 DIAGNOSIS — J320 Chronic maxillary sinusitis: Secondary | ICD-10-CM

## 2021-08-09 DIAGNOSIS — R591 Generalized enlarged lymph nodes: Secondary | ICD-10-CM | POA: Insufficient documentation

## 2021-08-09 DIAGNOSIS — R590 Localized enlarged lymph nodes: Secondary | ICD-10-CM

## 2021-08-09 DIAGNOSIS — D509 Iron deficiency anemia, unspecified: Secondary | ICD-10-CM | POA: Insufficient documentation

## 2021-08-09 LAB — CBC WITH DIFFERENTIAL/PLATELET
Abs Immature Granulocytes: 0.03 10*3/uL (ref 0.00–0.07)
Basophils Absolute: 0 10*3/uL (ref 0.0–0.1)
Basophils Relative: 0 %
Eosinophils Absolute: 0.2 10*3/uL (ref 0.0–0.5)
Eosinophils Relative: 2 %
HCT: 42.9 % (ref 36.0–46.0)
Hemoglobin: 12.8 g/dL (ref 12.0–15.0)
Immature Granulocytes: 0 %
Lymphocytes Relative: 20 %
Lymphs Abs: 2 10*3/uL (ref 0.7–4.0)
MCH: 22.2 pg — ABNORMAL LOW (ref 26.0–34.0)
MCHC: 29.8 g/dL — ABNORMAL LOW (ref 30.0–36.0)
MCV: 74.4 fL — ABNORMAL LOW (ref 80.0–100.0)
Monocytes Absolute: 0.6 10*3/uL (ref 0.1–1.0)
Monocytes Relative: 6 %
Neutro Abs: 7.1 10*3/uL (ref 1.7–7.7)
Neutrophils Relative %: 72 %
Platelets: 314 10*3/uL (ref 150–400)
RBC: 5.77 MIL/uL — ABNORMAL HIGH (ref 3.87–5.11)
RDW: 16.4 % — ABNORMAL HIGH (ref 11.5–15.5)
WBC: 10 10*3/uL (ref 4.0–10.5)
nRBC: 0 % (ref 0.0–0.2)

## 2021-08-09 LAB — IRON AND TIBC
Iron: 57 ug/dL (ref 28–170)
Saturation Ratios: 18 % (ref 10.4–31.8)
TIBC: 310 ug/dL (ref 250–450)
UIBC: 253 ug/dL

## 2021-08-09 LAB — FERRITIN: Ferritin: 58 ng/mL (ref 11–307)

## 2021-08-09 LAB — LACTATE DEHYDROGENASE: LDH: 101 U/L (ref 98–192)

## 2021-08-12 NOTE — Progress Notes (Deleted)
? ?[facility] ?[address] ?[place], [place] [address] ? ? ?CLINIC:  ?Medical Oncology/Hematology ? ?PCP:  ?Lindell Spar, MD ?[address] ?[place] [address] ?[phone] ? ? ?REASON FOR VISIT:  ?Follow-up for lymphadenopathy and iron deficiency anemia ?  ?PRIOR THERAPY: None ?  ?CURRENT THERAPY: Intermittent Feraheme (last in October 2022), iron tablet daily ? ?INTERVAL HISTORY:  ?[name] 66 y.o. female returns for routine follow-up of lymphadenopathy and iron deficiency anemia.  She was last seen by Tarri Abernethy PA-C on 02/05/2021. ? ?At today's visit, she reports feeling ***.  No recent hospitalizations, surgeries, or changes in baseline health status. ? ?*** Symptoms after IV iron in October ?*** Iron tablet ?*** Bleeding ?*** Fatigue ?*** Pica, RLS, headaches ?*** CP, DOE, LH, syncope  ? ?*** She denies any current lymphadenopathy in her neck or collarbone region.  She reports that she has intermittent swollen lymph nodes on her collarbone that are associated with flareups and her chronic sinusitis.  She reports that she continues to see Dr. Benjamine Mola due to chronic inflammation and swelling of her nasal passages related to narrow nasal passages.  She denies any recent infections, fever, chills, night sweats, or unintentional weight loss. ? ?She has ***% energy and ***% appetite. She endorses that she is maintaining a stable weight. ? ? ? ?REVIEW OF SYSTEMS:  ?Review of Systems - Oncology  ? ? ?PAST MEDICAL/SURGICAL HISTORY:  ?Past Medical History:  ?Diagnosis Date  ? Abdominal fibromatosis   ? Allergic rhinitis   ? Anemia   ? Asthma   ? Essential hypertension   ? GERD (gastroesophageal reflux disease)   ? History of cardiac catheterization   ? No significant CAD May 2015  ? History of migraine headaches   ? Hyperlipidemia   ? Iron deficiency anemia 02/05/2021  ? Myocarditis ([place])   ? a. diagnosed in 05/2020 with cath showing normal cors  ? Obesity   ? PAF (paroxysmal atrial  fibrillation) ([place]) 08/2013  ? Sleep apnea   ? Type 2 diabetes mellitus ([place])   ? ?Past Surgical History:  ?Procedure Laterality Date  ? CATARACT EXTRACTION    ? CATARACT EXTRACTION W/PHACO Right 04/05/2019  ? Procedure: CATARACT EXTRACTION PHACO AND INTRAOCULAR LENS PLACEMENT RIGHT EYE (CDE: 3.19);  Surgeon: Baruch Goldmann, MD;  Location: [facility];  Service: Ophthalmology;  Laterality: Right;  ? COLONOSCOPY N/A 02/02/2015  ? SLF: 1. one colon polyp removed-no source for anemia identified. 2. moderate diverticulosis noted in the sigmoid colon and descending colon 3. the left colon is redundant 4. Rectal bleeding due ot small internal hemorroids 5. Moderate sized external hemorrhoids.  ? ESOPHAGOGASTRODUODENOSCOPY N/A 02/02/2015  ? SLF: 1. 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 large hiatal hernia 3. mild non-erosive gastritis 4. No source for anemia identified.   ? LEFT HEART CATH AND CORONARY ANGIOGRAPHY N/A 05/14/2020  ? Procedure: LEFT HEART CATH AND CORONARY ANGIOGRAPHY;  Surgeon: Belva Crome, MD;  Location: [facility];  Service: Cardiovascular;  Laterality: N/A;  ? LEFT HEART CATHETERIZATION WITH CORONARY ANGIOGRAM N/A 09/03/2013  ? Procedure: LEFT HEART CATHETERIZATION WITH CORONARY ANGIOGRAM;  Surgeon: Jettie Booze, MD;  Location: [facility] CATH [facility];  Service: Cardiovascular;  Laterality: N/A;  ? Right carpal tunnel release    ? ? ? ?SOCIAL HISTORY:  ?Social History  ? ?Socioeconomic History  ? Marital status: Single  ?  Spouse name: Not on file  ? Number of children: 2  ?  Years of education: Not on file  ? Highest education level: Not on file  ?Occupational History  ? Occupation: Education officer, museum  ?  Employer: [employer]  ?Tobacco Use  ? Smoking status: Former  ?  Packs/day: 0.30  ?  Types: Cigarettes  ?  Quit date: 04/01/2012  ?  Years since quitting: 9.3  ? Smokeless tobacco: Never  ?Vaping Use  ? Vaping Use: Never used  ?Substance and Sexual Activity  ? Alcohol use: Not Currently  ?   Alcohol/week: 0.0 standard drinks  ?  Comment: very occasional  ? Drug use: No  ? Sexual activity: Yes  ?Other Topics Concern  ? Not on file  ?Social History Narrative  ? Not on file  ? ?Social Determinants of Health  ? ?Financial Resource Strain: Not on file  ?Food Insecurity: Not on file  ?Transportation Needs: Not on file  ?Physical Activity: Not on file  ?Stress: Not on file  ?Social Connections: Not on file  ?Intimate Partner Violence: Not on file  ? ? ?FAMILY HISTORY:  ?Family History  ?Problem Relation Age of Onset  ? Diabetes Mother   ? Hypertension Mother   ? Heart disease Mother   ? High Cholesterol Mother   ? Alzheimer's disease Father   ? Hypertension Sister   ? Hypertension Brother   ? Diabetes Brother   ? Arthritis Sister   ? Hypertension Sister   ? High Cholesterol Sister   ? Diabetes Son   ? Developmental delay Son   ? Colon cancer Neg Hx   ? ? ?CURRENT MEDICATIONS:  ?Outpatient Encounter Medications as of 08/13/2021  ?Medication Sig  ? acetaminophen (TYLENOL) 500 MG tablet Take 1 tablet (500 mg total) by mouth every 6 (six) hours as needed.  ? amoxicillin-clavulanate (AUGMENTIN) 875-125 MG tablet Take 1 tablet by mouth 2 (two) times daily. (Patient not taking: Reported on 06/09/2021)  ? atorvastatin (LIPITOR) 80 MG tablet Take 1 tablet by mouth once daily  ? ELIQUIS 5 MG TABS tablet Take 1 tablet by mouth twice daily  ? EMGALITY 120 MG/ML SOAJ INJECT 120 MG INTO THE SKIN EVERY 30 DAYS  ? empagliflozin (JARDIANCE) 10 MG TABS tablet Take 1 tablet (10 mg total) by mouth daily before breakfast.  ? fluticasone (FLONASE) 50 MCG/ACT nasal spray Place 2 sprays into both nostrils daily.  ? ipratropium (ATROVENT) 0.03 % nasal spray Place 2 sprays into both nostrils every 12 (twelve) hours.  ? Iron-FA-B Cmp-C-Biot-Probiotic (FUSION PLUS) CAPS Take 2 tablets by mouth daily.  ? lansoprazole (PREVACID) 30 MG capsule Take 1 capsule (30 mg total) by mouth 2 (two) times daily before a meal.  ? meclizine (ANTIVERT) 25  MG tablet Take 1 tablet (25 mg total) by mouth 3 (three) times daily as needed for dizziness.  ? metFORMIN (GLUCOPHAGE) 500 MG tablet TAKE 1 TABLET BY MOUTH TWICE DAILY WITH A MEAL  ? metoCLOPramide (REGLAN) 10 MG tablet Take 1 tablet (10 mg total) by mouth every 8 (eight) hours as needed for up to 7 days for nausea (Headache).  ? metoprolol succinate (TOPROL-XL) 50 MG 24 hr tablet TAKE 1 TABLET BY MOUTH  IN THE MORNING AND AT BEDTIME. TAKE WITH OR IMMEDIATELY FOLLOWING A MEAL  ? sacubitril-valsartan (ENTRESTO) 97-103 MG Take 1 tablet by mouth 2 (two) times daily.  ? topiramate (TOPAMAX) 50 MG tablet TAKE 1 TABLET BY MOUTH AT BEDTIME  ? Ubrogepant (UBRELVY) 50 MG TABS TAKE ONE TABLET BY MOUTH AT THE ONSET OF  A MIGRAINE. CAN REPEAT IN 2 HOURS IF NEEDED  ? ?No facility-administered encounter medications on file as of 08/13/2021.  ? ? ?ALLERGIES:  ?Allergies  ?Allergen Reactions  ? Venlafaxine Nausea And Vomiting and Other (See Comments)  ?  Dizziness, shakiness  ? ? ? ?PHYSICAL EXAM:  ?ECOG PERFORMANCE STATUS: {CHL ONC ECOG [id number]} ? ?There were no vitals filed for this visit. ?There were no vitals filed for this visit. ?Physical Exam ? ? ?LABORATORY DATA:  ?I have reviewed the labs as listed.  ?CBC ?   ?Component Value Date/Time  ? WBC 10.0 08/09/2021 1015  ? RBC 5.77 (H) 08/09/2021 1015  ? HGB 12.8 08/09/2021 1015  ? HGB 13.4 05/13/2021 0848  ? HCT 42.9 08/09/2021 1015  ? HCT 44.8 05/13/2021 0848  ? PLT 314 08/09/2021 1015  ? PLT 345 05/13/2021 0848  ? MCV 74.4 (L) 08/09/2021 1015  ? MCV 73 (L) 05/13/2021 0848  ? MCH 22.2 (L) 08/09/2021 1015  ? MCHC 29.8 (L) 08/09/2021 1015  ? RDW 16.4 (H) 08/09/2021 1015  ? RDW 16.3 (H) 05/13/2021 0848  ? LYMPHSABS 2.0 08/09/2021 1015  ? LYMPHSABS 2.1 05/13/2021 0848  ? MONOABS 0.6 08/09/2021 1015  ? EOSABS 0.2 08/09/2021 1015  ? EOSABS 0.2 05/13/2021 0848  ? BASOSABS 0.0 08/09/2021 1015  ? BASOSABS 0.0 05/13/2021 0848  ? ? ?  Latest Ref Rng & Units 05/13/2021  ?  8:48 AM  01/11/2021  ?  9:52 AM 09/24/2020  ? 11:00 AM  ?CMP  ?Glucose 70 - 99 mg/dL 124   141   137    ?BUN 8 - 27 mg/dL '13   22   18    '$ ?Creatinine 0.57 - 1.00 mg/dL 0.86   1.04   0.93    ?Sodium 134 - 144 mmol/L 146   137

## 2021-08-13 ENCOUNTER — Ambulatory Visit (HOSPITAL_COMMUNITY): Payer: BC Managed Care – PPO | Admitting: Physician Assistant

## 2021-08-13 IMAGING — MG DIGITAL SCREENING BILAT W/ TOMO
6 of 12 series · 6 of 36 positions shown · non-contrast
Comparison: Previous exam(s).

CLINICAL DATA: Screening.

EXAM:
DIGITAL SCREENING BILATERAL MAMMOGRAM WITH TOMO AND CAD

[L CC synth-2D (1 of 2)]
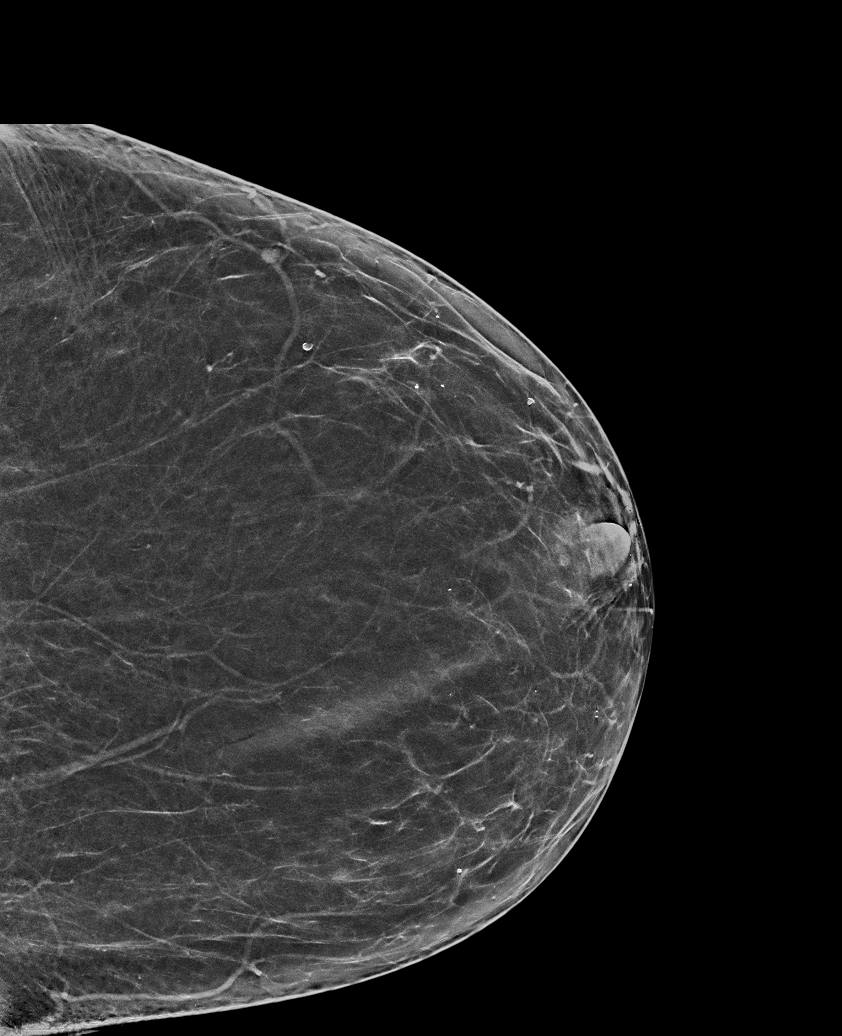

[L MLO synth-2D]
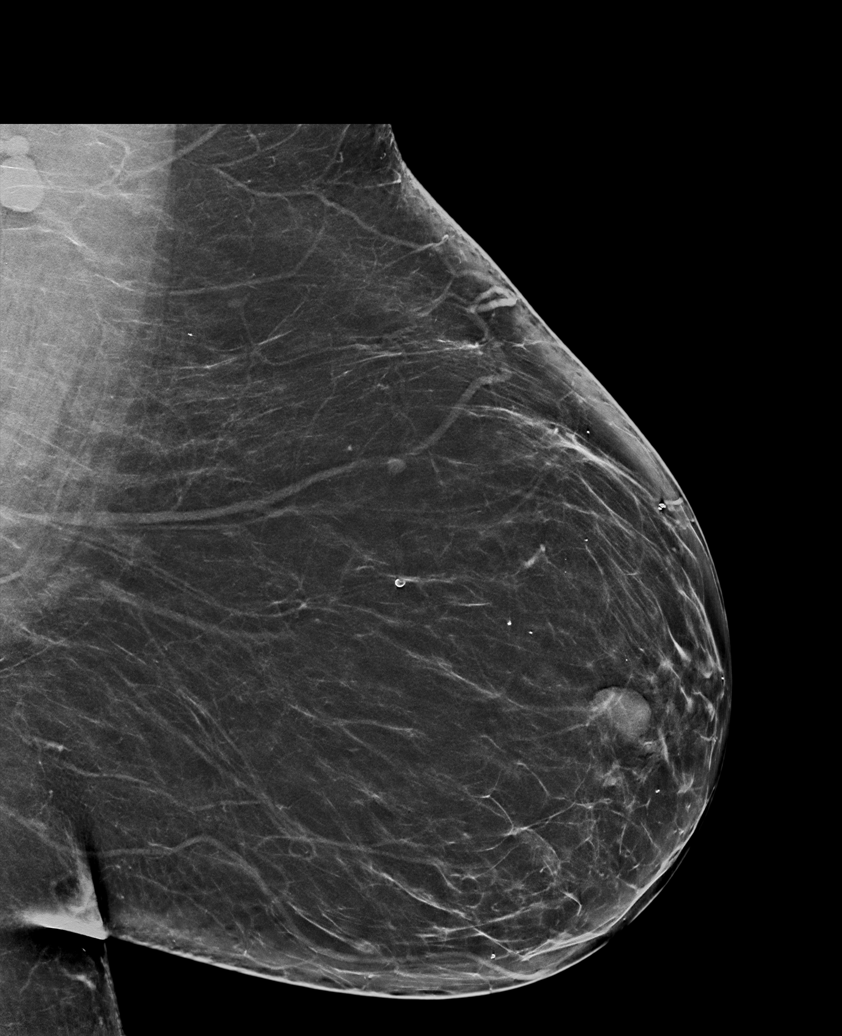

[L CC synth-2D (2 of 2)]
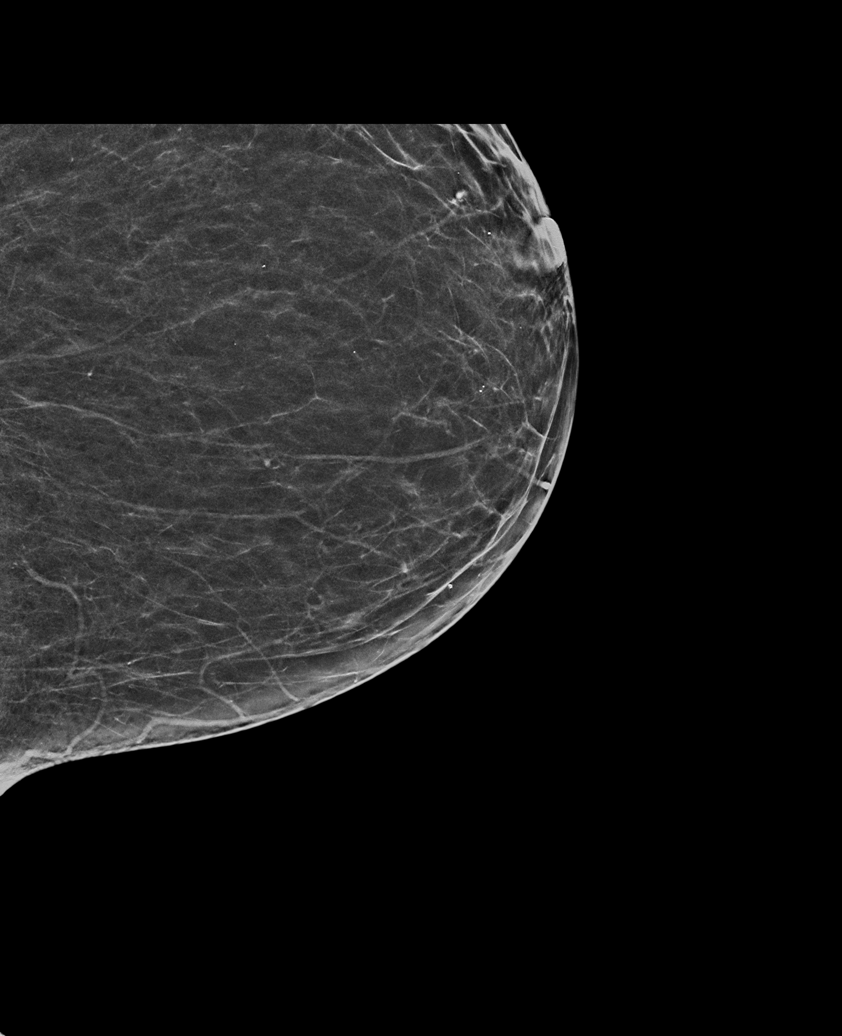

[R MLO synth-2D]
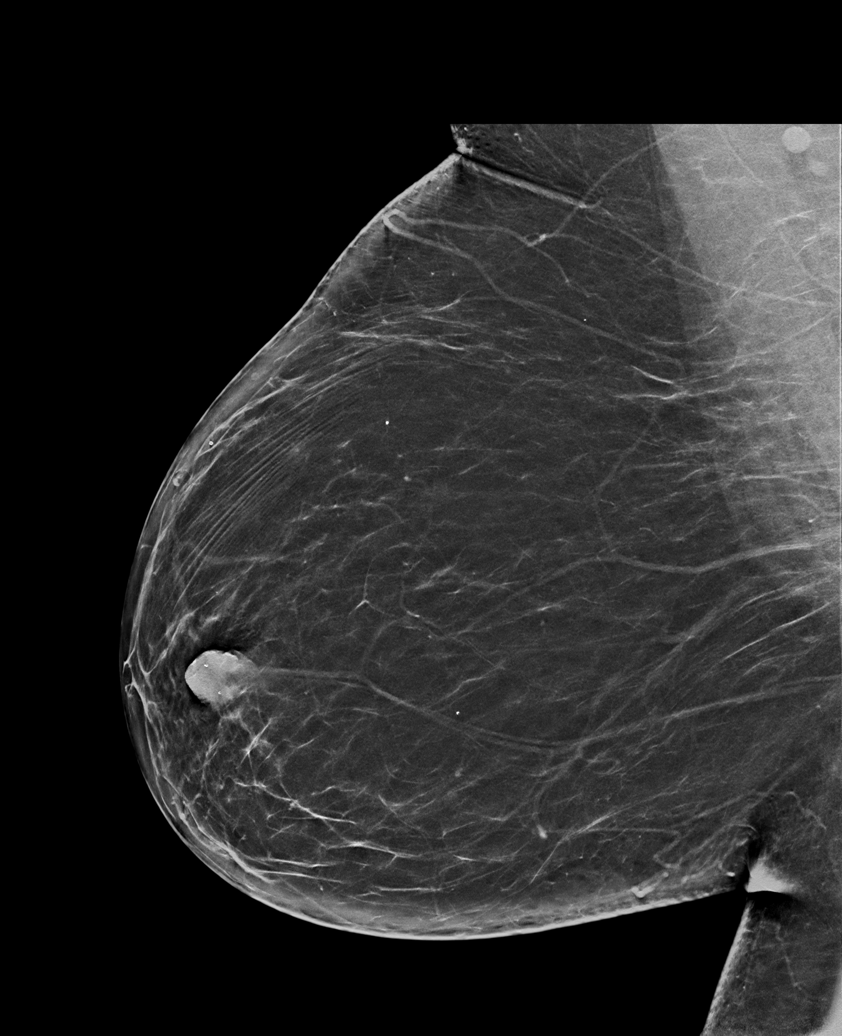

[R CC synth-2D (1 of 2)]
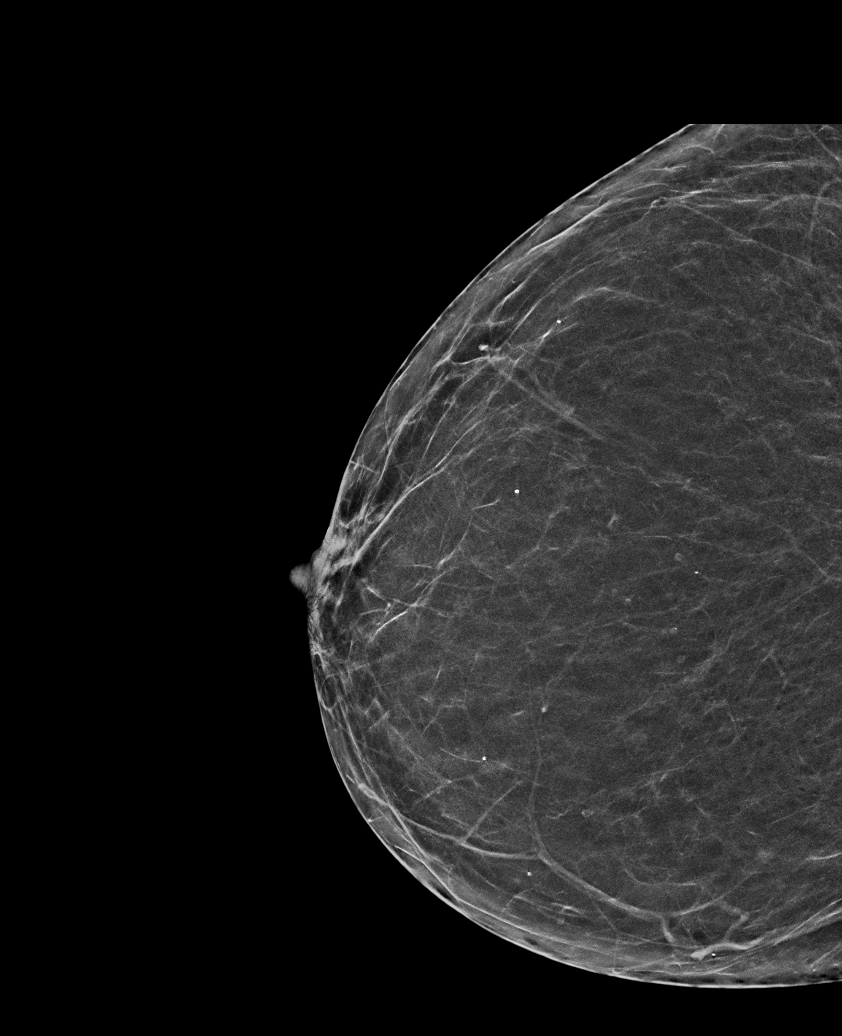

[R CC synth-2D (2 of 2)]
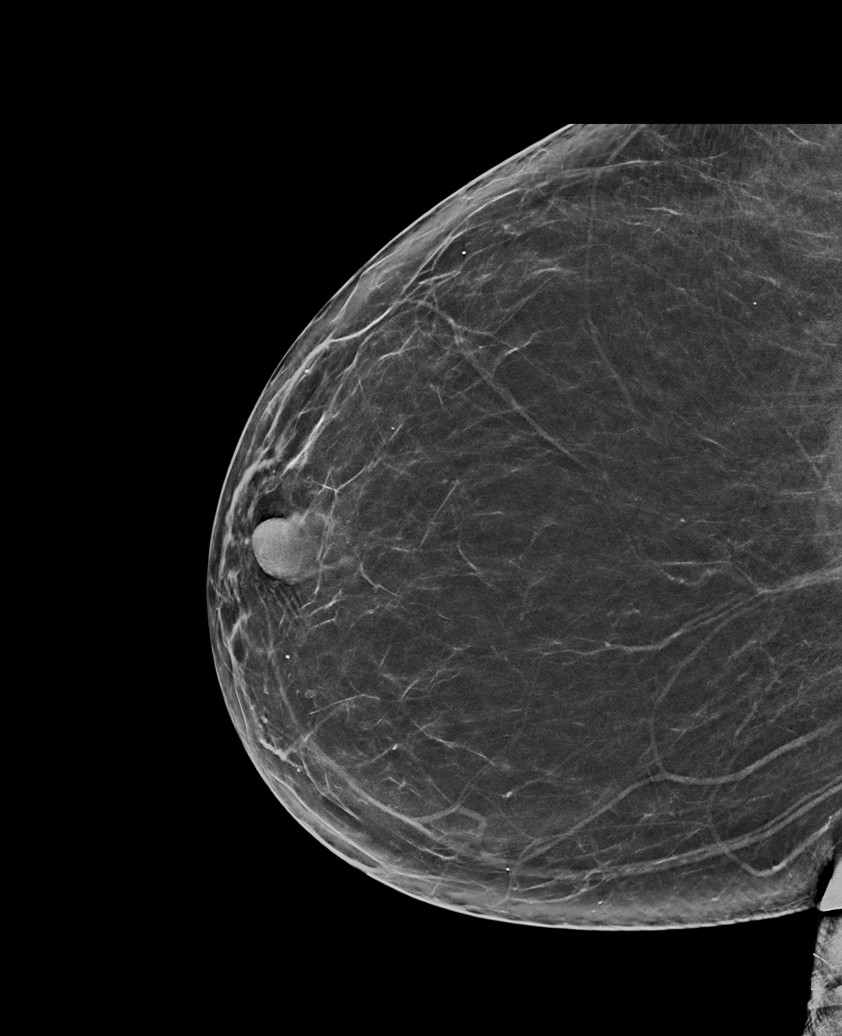

[6 of 36 positions shown; findings below may reference images not displayed]

ACR Breast Density Category b: There are scattered areas of
fibroglandular density.
FINDINGS: In the left breast, a possible mass warrants further evaluation. In
the right breast, no findings suspicious for malignancy.

Images were processed with CAD.
IMPRESSION: Further evaluation is suggested for possible mass in the left
breast.

RECOMMENDATION:
Diagnostic mammogram and possibly ultrasound of the left breast.
(Code:[id number])

The patient will be contacted regarding the findings, and additional
imaging will be scheduled.

BI-RADS CATEGORY  0: Incomplete. Need additional imaging evaluation
and/or prior mammograms for comparison.

## 2021-08-18 ENCOUNTER — Ambulatory Visit
Admission: RE | Admit: 2021-08-18 | Discharge: 2021-08-18 | Disposition: A | Discharge status: Home / Self Care | Payer: 59 | Source: Ambulatory Visit | Attending: Otolaryngology | Admitting: Otolaryngology

## 2021-08-18 DIAGNOSIS — J320 Chronic maxillary sinusitis: Secondary | ICD-10-CM | POA: Diagnosis present

## 2021-08-23 ENCOUNTER — Encounter (HOSPITAL_COMMUNITY): Payer: Self-pay | Admitting: Hematology

## 2021-08-23 ENCOUNTER — Telehealth: Payer: Self-pay | Admitting: Adult Health

## 2021-08-23 ENCOUNTER — Other Ambulatory Visit: Payer: Self-pay | Admitting: Internal Medicine

## 2021-08-23 NOTE — Telephone Encounter (Signed)
ok 

## 2021-08-23 NOTE — Telephone Encounter (Signed)
FYI for POD 4 and and Sarah, NP  ?Pt called asking if her f/u appointment could be moved up re: her migraines.  Pt states she went to her ENT thinking her migraines were sinus related.  The Dr advised she f/u with her Neurologist since the migraines have nothing to do with her sinuses.  There is no request for a call back ?

## 2021-08-25 ENCOUNTER — Telehealth (INDEPENDENT_AMBULATORY_CARE_PROVIDER_SITE_OTHER): Payer: 59 | Admitting: Neurology

## 2021-08-25 DIAGNOSIS — G43709 Chronic migraine without aura, not intractable, without status migrainosus: Secondary | ICD-10-CM

## 2021-08-25 NOTE — Progress Notes (Addendum)
? ?Virtual Visit via Video Note ? ?I connected with Monique Gomez on 08/25/21 at  3:00 PM EDT by a video enabled telemedicine application and verified that I am speaking with the correct person using two identifiers. ? ?Location: ?Patient: at her home ?Provider: in the office  ?  ?I discussed the limitations of evaluation and management by telemedicine and the availability of in person appointments. The patient expressed understanding and agreed to proceed. ? ?History of Present Illness: ?08/25/21 SS: Here today for follow-up, she couldn't get the virtual visit to work x 3. Sooner appointment. Reports for months, pressure around her eyes, dizziness felt related to sinuses. Sinuses were swollen hard to assess, took oral steroids x 7 days, things cleared. Using nasal spray and Zyrtec. Reports tingling around lips, feels pressure behind eyes. Not having migraine headaches. Reports pain to occipital area, crown, is like pounding. Is now retired. 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 Monique Gomez with good benefit or Tylenol. She thought all of this was related to sinus issues, but ENT assured her not. Told to come see neurologist. Migraines are typically severe pain, sensitive to light, nauseated. Current headaches are sporadic, few times a week, lasts until Monique Gomez or Tylenol. No exacerbating factors. Feel better when lying down. Denies numbness or weakness, no trouble with gait.  ? ?01/21/2021 MM: Monique Gomez is a 66 year old female with a history of migraine headaches.  At the last visit with Butler Denmark she was changed to [facility].  She reports that this is working better.  She states that her migraines have improved drastically.  When she does get a migraine [name] is helpful.  Her blood pressure is also improved since she switched to [employer].  She has been diagnosed with arthritis and her provider advised her to take Tylenol if needed. ?  ?Update 07/20/20 SS: Here today  for follow-up via virtual visit, currently taking Aimovig 70 mg monthly injection, Topamax 50 mg at bedtime, Ubrelvy as needed.  In January, she was diagnosed with viral myocarditis, has had significant fatigue, shortness of breath.  She has been out of work since January.  Her headaches were well controlled up until about a month ago, started having recurrence of her typical migraine headaches.  Taking daily Tylenol or Ubrelvy.  The headaches will initially improved, then will return a few hours later.  Feels benefit of Aimovig has worn off. Migraines rotate frontal area, occipital, can localize behind the left eye.  Has migraine features with significant headaches. ?  ?Observations/Objective: ?Via telephone visit, she cannot connect virtually, tried multiple times, is alert and oriented speech is clear and concise, good historian ? ?Assessment and Plan: ?Chronic migraine headache ? ?-Reported different type of headache for the last several months, is worrisome to her, has tingling around lips, pressure behind eyes, will check MRI of the brain without contrast  ?-Has seen ENT, reportedly ruled out sinus issues ?-Continue Emgality monthly injection for migraine prevention, ?-Continue Topamax 50 mg at bedtime for migraine prevention, if MRI of the brain is unrevealing, would consider a dose increase ?-Continue Ubrelvy 50 mg as needed for acute headache ?-Current headaches are responsive to [place] or Tylenol, potentially headaches just have a different presentation with Emgality on board ?-Follow-up with me in 4 months or sooner if needed ? ?Follow Up Instructions: ?4 months 01/13/22 9:45 ?  ?I discussed the assessment and treatment plan with the patient. The patient was provided an opportunity to ask questions and  all were answered. The patient agreed with the plan and demonstrated an understanding of the instructions. ?  ?The patient was advised to call back or seek an in-person evaluation if the symptoms worsen or  if the condition fails to improve as anticipated. ? ?I spent 24 minutes of time on this telephone encounter.  ? ?Butler Denmark, AGNP-C, DNP  ?[facility] ?[address], [address] ?[place], [place] [address] ?[phone] ? ? ?

## 2021-08-26 ENCOUNTER — Other Ambulatory Visit: Payer: Self-pay | Admitting: Internal Medicine

## 2021-08-26 ENCOUNTER — Encounter (HOSPITAL_COMMUNITY): Payer: Self-pay | Admitting: Hematology

## 2021-08-26 ENCOUNTER — Other Ambulatory Visit: Payer: Self-pay | Admitting: Adult Health

## 2021-08-26 ENCOUNTER — Telehealth: Payer: Self-pay | Admitting: Neurology

## 2021-08-26 NOTE — Telephone Encounter (Signed)
[employer] auth pending approval  ?

## 2021-08-26 NOTE — Telephone Encounter (Signed)
Called patient to schedule and she asked to do MRI at [facility]. Sent notes to insurance-pending approval. ? ?08/26/21 [employer] auth: [id number] (exp. 08/26/21 to 11/25/21)  ?

## 2021-08-29 NOTE — Progress Notes (Signed)
? ?[facility] ?[address] ?[place], [place] [address] ? ? ?CLINIC:  ?Medical Oncology/Hematology ? ?PCP:  ?Lindell Spar, MD ?[address] ?[place] [address] ?[phone] ? ? ?REASON FOR VISIT:  ?Follow-up for lymphadenopathy and iron deficiency anemia ?  ?PRIOR THERAPY: None ?  ?CURRENT THERAPY: Intermittent Feraheme (last in October 2022), iron tablet daily ?  ?INTERVAL HISTORY:  ?[name] 66 y.o. female returns for routine follow-up of lymphadenopathy and iron deficiency anemia.  She was last seen by Tarri Abernethy PA-C on 02/05/2021. ?  ?At today's visit, she reports feeling fairly well.  No recent hospitalizations, surgeries, or changes in baseline health status. ? ?She reports that she had improved energy after her IV iron in October.  She continues to take her iron tablet twice daily.  She reports that her stools are dark from her iron supplementation, but she denies any gross hematochezia, melena, or epistaxis.  She reports that her energy is "pretty good" at about 75-85%.  She has chronic migraines and sinus headaches.  She denies any restless legs, pica, chest pain, dyspnea on exertion, lightheadedness, or syncope. ?  ?She denies any current lymphadenopathy in her neck or collarbone region.  She reports that she has intermittent swollen lymph nodes on her collarbone that are associated with flareups and her chronic sinusitis.  She reports that she continues to see Dr. Benjamine Mola due to chronic inflammation and swelling of her nasal passages related to narrow nasal passages.  She denies any recent infections, fever, chills, night sweats, or unintentional weight loss. ?  ?She has 75% energy and 100% appetite. She endorses that she is maintaining a stable weight. ? ? ?REVIEW OF SYSTEMS:  ?Review of Systems  ?Constitutional:  Positive for fatigue. Negative for appetite change, chills, diaphoresis, fever and unexpected weight change.  ?HENT:   Negative for lump/mass and nosebleeds.   ?      Sinus issues  ?Eyes:  Negative for eye problems.  ?Respiratory:  Positive for shortness of breath (from sinuses). Negative for cough and hemoptysis.   ?Cardiovascular:  Positive for palpitations. Negative for chest pain and leg swelling.  ?Gastrointestinal:  Negative for abdominal pain, blood in stool, constipation, diarrhea, nausea and vomiting.  ?Genitourinary:  Negative for hematuria.   ?Skin: Negative.   ?Neurological:  Positive for dizziness and headaches. Negative for light-headedness.  ?Hematological:  Does not bruise/bleed easily.   ? ? ?PAST MEDICAL/SURGICAL HISTORY:  ?Past Medical History:  ?Diagnosis Date  ? Abdominal fibromatosis   ? Allergic rhinitis   ? Anemia   ? Asthma   ? Essential hypertension   ? GERD (gastroesophageal reflux disease)   ? History of cardiac catheterization   ? No significant CAD May 2015  ? History of migraine headaches   ? Hyperlipidemia   ? Iron deficiency anemia 02/05/2021  ? Myocarditis ([place])   ? a. diagnosed in 05/2020 with cath showing normal cors  ? Obesity   ? PAF (paroxysmal atrial fibrillation) ([place]) 08/2013  ? Sleep apnea   ? Type 2 diabetes mellitus ([place])   ? ?Past Surgical History:  ?Procedure Laterality Date  ? CATARACT EXTRACTION    ? CATARACT EXTRACTION W/PHACO Right 04/05/2019  ? Procedure: CATARACT EXTRACTION PHACO AND INTRAOCULAR LENS PLACEMENT RIGHT EYE (CDE: 3.19);  Surgeon: Baruch Goldmann, MD;  Location: [facility];  Service: Ophthalmology;  Laterality: Right;  ? COLONOSCOPY N/A 02/02/2015  ? SLF: 1. one colon polyp removed-no source for anemia identified. 2. moderate diverticulosis  noted in the sigmoid colon and descending colon 3. the left colon is redundant 4. Rectal bleeding due ot small internal hemorroids 5. Moderate sized external hemorrhoids.  ? ESOPHAGOGASTRODUODENOSCOPY N/A 02/02/2015  ? SLF: 1. 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 large hiatal hernia 3. mild non-erosive gastritis 4. No source for anemia identified.   ? LEFT HEART CATH  AND CORONARY ANGIOGRAPHY N/A 05/14/2020  ? Procedure: LEFT HEART CATH AND CORONARY ANGIOGRAPHY;  Surgeon: Belva Crome, MD;  Location: [facility];  Service: Cardiovascular;  Laterality: N/A;  ? LEFT HEART CATHETERIZATION WITH CORONARY ANGIOGRAM N/A 09/03/2013  ? Procedure: LEFT HEART CATHETERIZATION WITH CORONARY ANGIOGRAM;  Surgeon: Jettie Booze, MD;  Location: [facility] C[facility];  Service: Cardiovascular;  Laterality: N/A;  ? Right carpal tunnel release    ? ? ? ?SOCIAL HISTORY:  ?Social History  ? ?Socioeconomic History  ? Marital status: Single  ?  Spouse name: Not on file  ? Number of children: 2  ? Years of education: Not on file  ? Highest education level: Not on file  ?Occupational History  ? Occupation: Education officer, museum  ?  Employer: [employer]  ?Tobacco Use  ? Smoking status: Former  ?  Packs/day: 0.30  ?  Types: Cigarettes  ?  Quit date: 04/01/2012  ?  Years since quitting: 9.4  ? Smokeless tobacco: Never  ?Vaping Use  ? Vaping Use: Never used  ?Substance and Sexual Activity  ? Alcohol use: Not Currently  ?  Alcohol/week: 0.0 standard drinks  ?  Comment: very occasional  ? Drug use: No  ? Sexual activity: Yes  ?Other Topics Concern  ? Not on file  ?Social History Narrative  ? Not on file  ? ?Social Determinants of Health  ? ?Financial Resource Strain: Not on file  ?Food Insecurity: Not on file  ?Transportation Needs: Not on file  ?Physical Activity: Not on file  ?Stress: Not on file  ?Social Connections: Not on file  ?Intimate Partner Violence: Not on file  ? ? ?FAMILY HISTORY:  ?Family History  ?Problem Relation Age of Onset  ? Diabetes Mother   ? Hypertension Mother   ? Heart disease Mother   ? High Cholesterol Mother   ? Alzheimer's disease Father   ? Hypertension Sister   ? Hypertension Brother   ? Diabetes Brother   ? Arthritis Sister   ? Hypertension Sister   ? High Cholesterol Sister   ? Diabetes Son   ? Developmental delay Son   ? Colon cancer Neg Hx   ? ? ?CURRENT MEDICATIONS:   ?Outpatient Encounter Medications as of 08/31/2021  ?Medication Sig  ? acetaminophen (TYLENOL) 500 MG tablet Take 1 tablet (500 mg total) by mouth every 6 (six) hours as needed.  ? amoxicillin-clavulanate (AUGMENTIN) 875-125 MG tablet Take 1 tablet by mouth 2 (two) times daily. (Patient not taking: Reported on 06/09/2021)  ? atorvastatin (LIPITOR) 80 MG tablet Take 1 tablet by mouth once daily  ? ELIQUIS 5 MG TABS tablet Take 1 tablet by mouth twice daily  ? EMGALITY 120 MG/ML SOAJ INJECT 120 MG  SUBCUTANEOUSLY ONCE EVERY 30 DAYS  ? empagliflozin (JARDIANCE) 10 MG TABS tablet Take 1 tablet (10 mg total) by mouth daily before breakfast.  ? fluticasone (FLONASE) 50 MCG/ACT nasal spray Place 2 sprays into both nostrils daily.  ? ipratropium (ATROVENT) 0.03 % nasal spray Place 2 sprays into both nostrils every 12 (twelve) hours.  ? Iron-FA-B Cmp-C-Biot-Probiotic (FUSION PLUS)  CAPS Take 2 capsules by mouth once daily  ? lansoprazole (PREVACID) 30 MG capsule Take 1 capsule (30 mg total) by mouth 2 (two) times daily before a meal.  ? meclizine (ANTIVERT) 25 MG tablet Take 1 tablet (25 mg total) by mouth 3 (three) times daily as needed for dizziness.  ? metFORMIN (GLUCOPHAGE) 500 MG tablet TAKE 1 TABLET BY MOUTH TWICE DAILY WITH A MEAL  ? metoCLOPramide (REGLAN) 10 MG tablet Take 1 tablet (10 mg total) by mouth every 8 (eight) hours as needed for up to 7 days for nausea (Headache).  ? metoprolol succinate (TOPROL-XL) 50 MG 24 hr tablet TAKE 1 TABLET BY MOUTH  IN THE MORNING AND AT BEDTIME. TAKE WITH OR IMMEDIATELY FOLLOWING A MEAL  ? sacubitril-valsartan (ENTRESTO) 97-103 MG Take 1 tablet by mouth 2 (two) times daily.  ? topiramate (TOPAMAX) 50 MG tablet TAKE 1 TABLET BY MOUTH AT BEDTIME  ? Ubrogepant (UBRELVY) 50 MG TABS TAKE ONE TABLET BY MOUTH AT THE ONSET OF A MIGRAINE. CAN REPEAT IN 2 HOURS IF NEEDED  ? ?No facility-administered encounter medications on file as of 08/31/2021.  ? ? ?ALLERGIES:  ?Allergies  ?Allergen  Reactions  ? Venlafaxine Nausea And Vomiting and Other (See Comments)  ?  Dizziness, shakiness  ? ? ? ?PHYSICAL EXAM:  ?ECOG PERFORMANCE STATUS: 1 - Symptomatic but completely ambulatory ? ?There were no vitals filed fo

## 2021-08-30 NOTE — Telephone Encounter (Signed)
Friday health auth: [id number] (exp. 08/26/21 to 11/25/21) order sent to [facility] to be schduled at [facility]. They will reach out to the patient to schedule.  ?

## 2021-08-31 ENCOUNTER — Inpatient Hospital Stay (HOSPITAL_COMMUNITY): Payer: 59 | Attending: Physician Assistant | Admitting: Physician Assistant

## 2021-08-31 VITALS — BP 147/100 | HR 99 | Temp 97.0°F | Resp 20 | Ht 63.19 in | Wt 281.1 lb

## 2021-08-31 DIAGNOSIS — Z87891 Personal history of nicotine dependence: Secondary | ICD-10-CM | POA: Insufficient documentation

## 2021-08-31 DIAGNOSIS — I480 Paroxysmal atrial fibrillation: Secondary | ICD-10-CM | POA: Insufficient documentation

## 2021-08-31 DIAGNOSIS — R590 Localized enlarged lymph nodes: Secondary | ICD-10-CM | POA: Diagnosis not present

## 2021-08-31 DIAGNOSIS — Z7984 Long term (current) use of oral hypoglycemic drugs: Secondary | ICD-10-CM | POA: Insufficient documentation

## 2021-08-31 DIAGNOSIS — Z7901 Long term (current) use of anticoagulants: Secondary | ICD-10-CM | POA: Diagnosis not present

## 2021-08-31 DIAGNOSIS — Z79899 Other long term (current) drug therapy: Secondary | ICD-10-CM | POA: Diagnosis not present

## 2021-08-31 DIAGNOSIS — R718 Other abnormality of red blood cells: Secondary | ICD-10-CM

## 2021-08-31 DIAGNOSIS — D509 Iron deficiency anemia, unspecified: Secondary | ICD-10-CM | POA: Insufficient documentation

## 2021-08-31 DIAGNOSIS — R591 Generalized enlarged lymph nodes: Secondary | ICD-10-CM | POA: Insufficient documentation

## 2021-08-31 NOTE — Patient Instructions (Signed)
[facility] ?Discharge Instructions ? ?You were seen today by Tarri Abernethy PA-C for your iron deficiency anemia and your lymph node swelling. ? ?IRON DEFICIENCY ANEMIA: Your blood count is normal at this visit.  Your iron level is mildly low, but not low enough that you absolutely need IV iron at this time.  Continue to take your iron tablet once daily.  We will recheck your labs again in 6 months, but please call our office if you start to feel like your iron is low before that time. ? ?LYMPH NODE SWELLING: No abnormal lymph nodes were found on exam today.  I suspect that the on-and-off lymph node swelling you experience is secondary to inflammation in your nasal cavity.  If you have any lymph node swelling that does not go away, please let us know. ? ?LABS: Return in 6 months for repeat labs ? ?OTHER TESTS: No other tests at this time ? ?MEDICATIONS: Continue taking iron tablet daily. ? ?FOLLOW-UP APPOINTMENT: Office visit in 6 months, after labs ? ? ?Thank you for choosing [facility] at [facility] to provide your oncology and hematology care.  To afford each patient quality time with our provider, please arrive at least 15 minutes before your scheduled appointment time.  ? ?If you have a lab appointment with the [facility] please come in thru the Main Entrance and check in at the main information desk. ? ?You need to re-schedule your appointment should you arrive 10 or more minutes late.  We strive to give you quality time with our providers, and arriving late affects you and other patients whose appointments are after yours.  Also, if you no show three or more times for appointments you may be dismissed from the clinic at the providers discretion.     ?Again, thank you for choosing [facility].  Our hope is that these requests will decrease the amount of time that you wait before being seen by our physicians.        ?_____________________________________________________________ ? ?Should you have questions after your visit to [facility], please contact our office at [phone] and follow the prompts.  Our office hours are 8:00 a.m. and 4:30 p.m. Monday - Friday.  Please note that voicemails left after 4:00 p.m. may not be returned until the following business day.  We are closed weekends and major holidays.  You do have access to a nurse 24-7, just call the main number to the clinic [phone] and do not press any options, hold on the line and a nurse will answer the phone.   ? ?For prescription refill requests, have your pharmacy contact our office and allow 72 hours.   ? ?Due to Covid, you will need to wear a mask upon entering the hospital. If you do not have a mask, a mask will be given to you at the Main Entrance upon arrival. For doctor visits, patients may have 1 support person age 78 or older with them. For treatment visits, patients can not have anyone with them due to social distancing guidelines and our immunocompromised population.  ? ? ? ?

## 2021-09-09 ENCOUNTER — Other Ambulatory Visit: Payer: Self-pay | Admitting: Internal Medicine

## 2021-09-15 ENCOUNTER — Ambulatory Visit (HOSPITAL_COMMUNITY)
Admission: RE | Admit: 2021-09-15 | Discharge: 2021-09-15 | Disposition: A | Discharge status: Home / Self Care | Payer: 59 | Source: Ambulatory Visit | Attending: Neurology | Admitting: Neurology

## 2021-09-15 ENCOUNTER — Telehealth: Payer: Self-pay | Admitting: Neurology

## 2021-09-15 DIAGNOSIS — G43709 Chronic migraine without aura, not intractable, without status migrainosus: Secondary | ICD-10-CM | POA: Insufficient documentation

## 2021-09-15 NOTE — Telephone Encounter (Signed)
Please call, MRI of the brain is stable, no acute concerns. Check on how she is doing, we can increase Topamax to 100 mg at bedtime, if headaches still an issue, I would recommend seeing ophthalmology for evaluation of any papilledema, as mentioned in MRI with empty sella turcica, but of note this has been present on prior imaging.  ? ?IMPRESSION: ?No evidence of acute intracranial abnormality. ?  ?Mild chronic small vessel ischemic changes within the cerebral white ?matter and pons, similar to the prior MRI of 10/23/2018. ?  ?Partially empty sella turcica. While this finding often reflects ?incidental anatomic variation, it can be associated with idiopathic ?intracranial hypertension (pseudotumor cerebri). ?  ? ? ?

## 2021-09-16 NOTE — Telephone Encounter (Signed)
Called the patient and reviewed the results from the MRI of the brain. Advised things are stable and no new changes are present. Pt verbalized understanding. Pt is still having headaches was advised to increase the topamax to 100 mg at bedtime. She will start to take 2 of her tablets at bedtime. Advised after 2 weeks of doing this, send [place] an update on mychart letting us know how things are going. Pt agreed with this plan.  Advised that Judson Roch recommended the pt follow up with ophthalmology. Pt has done that and her vision is good per them.  She will update [place] in a couple of weeks and was appreciative for the call.

## 2021-09-21 ENCOUNTER — Telehealth: Payer: Self-pay | Admitting: *Deleted

## 2021-09-21 NOTE — Telephone Encounter (Signed)
PA Case: [id number] approved through 09/22/22.

## 2021-09-21 NOTE — Telephone Encounter (Signed)
PA for Emgality started on covermymeds (key: [id number]). Pharmacy coverage through [facility] [phone]). Decision pending.

## 2021-10-04 DIAGNOSIS — H26492 Other secondary cataract, left eye: Secondary | ICD-10-CM | POA: Diagnosis not present

## 2021-10-04 DIAGNOSIS — H01001 Unspecified blepharitis right upper eyelid: Secondary | ICD-10-CM | POA: Diagnosis not present

## 2021-10-04 DIAGNOSIS — H01002 Unspecified blepharitis right lower eyelid: Secondary | ICD-10-CM | POA: Diagnosis not present

## 2021-10-04 DIAGNOSIS — H01004 Unspecified blepharitis left upper eyelid: Secondary | ICD-10-CM | POA: Diagnosis not present

## 2021-10-05 ENCOUNTER — Telehealth: Payer: Self-pay | Admitting: Neurology

## 2021-10-05 ENCOUNTER — Other Ambulatory Visit: Payer: Self-pay | Admitting: *Deleted

## 2021-10-05 NOTE — Telephone Encounter (Signed)
Pt said, for a month having pressure top of head with a dull headache. Have been taking Topamax as instructed. Have seen the ENT and eye doctor, Either said  did not see anything that could be causing a headache. Would like  a call from the nurse.

## 2021-10-05 NOTE — Telephone Encounter (Signed)
I spoke to the patient. She has continued having frequent pain on the top of the head, sometimes radiating to both sides of her neck. Eye exam and ENT work-up unremarkable. MRI brain was stable. She has increased her topiramate to '100mg'$  QHS. [name] typically works but she is using it sparingly. She has only used two injections of Emgality. Due for her third dose the last week of June. I spoke to her about giving it at least three months to allow for full efficacy. She is willing to try to stay the course for a little longer. If headaches do not improve after her third Emgality injection or if they get worse, she will call our office back.

## 2021-10-06 ENCOUNTER — Other Ambulatory Visit: Payer: Self-pay | Admitting: Student

## 2021-10-11 ENCOUNTER — Other Ambulatory Visit: Payer: Self-pay | Admitting: Internal Medicine

## 2021-10-12 ENCOUNTER — Other Ambulatory Visit (HOSPITAL_COMMUNITY): Payer: Self-pay | Admitting: Internal Medicine

## 2021-10-21 ENCOUNTER — Other Ambulatory Visit: Payer: Self-pay | Admitting: Adult Health

## 2021-10-26 ENCOUNTER — Telehealth: Payer: Self-pay | Admitting: Neurology

## 2021-10-26 ENCOUNTER — Encounter: Payer: Self-pay | Admitting: *Deleted

## 2021-10-26 NOTE — Telephone Encounter (Signed)
Rx sent today.

## 2021-10-27 ENCOUNTER — Other Ambulatory Visit: Payer: Self-pay | Admitting: Internal Medicine

## 2021-11-02 ENCOUNTER — Encounter (HOSPITAL_COMMUNITY): Payer: Self-pay | Admitting: *Deleted

## 2021-11-02 ENCOUNTER — Emergency Department (HOSPITAL_COMMUNITY)
Admission: EM | Admit: 2021-11-02 | Discharge: 2021-11-02 | Disposition: A | Discharge status: Home / Self Care | Payer: Medicare HMO | Attending: Emergency Medicine | Admitting: Emergency Medicine

## 2021-11-02 ENCOUNTER — Emergency Department (HOSPITAL_COMMUNITY): Payer: Medicare HMO

## 2021-11-02 ENCOUNTER — Other Ambulatory Visit: Payer: Self-pay

## 2021-11-02 ENCOUNTER — Encounter (HOSPITAL_COMMUNITY): Payer: Self-pay | Admitting: Hematology

## 2021-11-02 DIAGNOSIS — I2510 Atherosclerotic heart disease of native coronary artery without angina pectoris: Secondary | ICD-10-CM | POA: Diagnosis not present

## 2021-11-02 DIAGNOSIS — Z79899 Other long term (current) drug therapy: Secondary | ICD-10-CM | POA: Diagnosis not present

## 2021-11-02 DIAGNOSIS — E119 Type 2 diabetes mellitus without complications: Secondary | ICD-10-CM | POA: Diagnosis not present

## 2021-11-02 DIAGNOSIS — J45909 Unspecified asthma, uncomplicated: Secondary | ICD-10-CM | POA: Diagnosis not present

## 2021-11-02 DIAGNOSIS — I10 Essential (primary) hypertension: Secondary | ICD-10-CM | POA: Diagnosis not present

## 2021-11-02 DIAGNOSIS — Z7951 Long term (current) use of inhaled steroids: Secondary | ICD-10-CM | POA: Diagnosis not present

## 2021-11-02 DIAGNOSIS — Z7901 Long term (current) use of anticoagulants: Secondary | ICD-10-CM | POA: Insufficient documentation

## 2021-11-02 DIAGNOSIS — Z7984 Long term (current) use of oral hypoglycemic drugs: Secondary | ICD-10-CM | POA: Diagnosis not present

## 2021-11-02 DIAGNOSIS — R519 Headache, unspecified: Secondary | ICD-10-CM | POA: Insufficient documentation

## 2021-11-02 DIAGNOSIS — D72829 Elevated white blood cell count, unspecified: Secondary | ICD-10-CM | POA: Diagnosis not present

## 2021-11-02 LAB — CBC WITH DIFFERENTIAL/PLATELET
Abs Immature Granulocytes: 0.05 10*3/uL (ref 0.00–0.07)
Basophils Absolute: 0 10*3/uL (ref 0.0–0.1)
Basophils Relative: 0 %
Eosinophils Absolute: 0.2 10*3/uL (ref 0.0–0.5)
Eosinophils Relative: 2 %
HCT: 43.7 % (ref 36.0–46.0)
Hemoglobin: 13.1 g/dL (ref 12.0–15.0)
Immature Granulocytes: 0 %
Lymphocytes Relative: 18 %
Lymphs Abs: 2.2 10*3/uL (ref 0.7–4.0)
MCH: 22 pg — ABNORMAL LOW (ref 26.0–34.0)
MCHC: 30 g/dL (ref 30.0–36.0)
MCV: 73.4 fL — ABNORMAL LOW (ref 80.0–100.0)
Monocytes Absolute: 0.8 10*3/uL (ref 0.1–1.0)
Monocytes Relative: 6 %
Neutro Abs: 8.6 10*3/uL — ABNORMAL HIGH (ref 1.7–7.7)
Neutrophils Relative %: 74 %
Platelets: 304 10*3/uL (ref 150–400)
RBC: 5.95 MIL/uL — ABNORMAL HIGH (ref 3.87–5.11)
RDW: 16.2 % — ABNORMAL HIGH (ref 11.5–15.5)
WBC: 11.8 10*3/uL — ABNORMAL HIGH (ref 4.0–10.5)
nRBC: 0 % (ref 0.0–0.2)

## 2021-11-02 LAB — BASIC METABOLIC PANEL
Anion gap: 8 (ref 5–15)
BUN: 20 mg/dL (ref 8–23)
CO2: 23 mmol/L (ref 22–32)
Calcium: 9 mg/dL (ref 8.9–10.3)
Chloride: 109 mmol/L (ref 98–111)
Creatinine, Ser: 0.94 mg/dL (ref 0.44–1.00)
GFR, Estimated: >60 mL/min (ref >60)
Glucose, Bld: 137 mg/dL — ABNORMAL HIGH (ref 70–99)
Potassium: 3.6 mmol/L (ref 3.5–5.1)
Sodium: 140 mmol/L (ref 135–145)

## 2021-11-02 MED ORDER — MAGNESIUM SULFATE IN D5W 1-5 GM/100ML-% IV SOLN
1.0000 g | Freq: Once | INTRAVENOUS | Status: AC
  Administered 2021-11-02: 1 g via INTRAVENOUS
  Filled 2021-11-02 (×2): qty 100

## 2021-11-02 MED ORDER — PROCHLORPERAZINE EDISYLATE 10 MG/2ML IJ SOLN
10.0000 mg | Freq: Once | INTRAMUSCULAR | Status: AC
  Administered 2021-11-02: 10 mg via INTRAVENOUS
  Filled 2021-11-02: qty 2

## 2021-11-02 MED ORDER — DIPHENHYDRAMINE HCL 50 MG/ML IJ SOLN
12.5000 mg | Freq: Once | INTRAMUSCULAR | Status: AC
  Administered 2021-11-02: 12.5 mg via INTRAVENOUS
  Filled 2021-11-02: qty 1

## 2021-11-02 MED ORDER — KETOROLAC TROMETHAMINE 15 MG/ML IJ SOLN
15.0000 mg | Freq: Once | INTRAMUSCULAR | Status: AC
  Administered 2021-11-02: 15 mg via INTRAVENOUS
  Filled 2021-11-02: qty 1

## 2021-11-02 MED ORDER — DEXAMETHASONE SODIUM PHOSPHATE 10 MG/ML IJ SOLN
10.0000 mg | Freq: Once | INTRAMUSCULAR | Status: AC
  Administered 2021-11-02: 10 mg via INTRAVENOUS
  Filled 2021-11-02: qty 1

## 2021-11-02 MED ORDER — LACTATED RINGERS IV BOLUS
1000.0000 mL | Freq: Once | INTRAVENOUS | Status: AC
Start: 2021-11-02 — End: 2021-11-02
  Administered 2021-11-02: 1000 mL via INTRAVENOUS

## 2021-11-02 NOTE — ED Triage Notes (Signed)
Pt c/o headache x 2 weeks; states she takes a migraine shot every month and had it on the 27th of June but pt still c/o headache  Pt describes pain as a pressure and states the pain is to the top of her head

## 2021-11-02 NOTE — ED Provider Notes (Signed)
[facility] EMERGENCY DEPARTMENT Provider Note   CSN: [id number] Arrival date & time: 11/02/21  0847     History  Chief Complaint  Patient presents with   Headache    Monique Gomez is a 66 y.o. female.  HPI Patient presents for headache.  Medical history includes HLD, obesity, anemia, migraines, HTN, GERD, DM, depression, anxiety, CAD, OSA, and asthma.  Current headache has been persistent for the past 2 weeks.  She states that it is most prominent on the vertex and right temporal area.  She occasionally feels pressure behind her right eye.  She does have ongoing sinus congestion and has felt maxillary sinus pressure.  She states that her nasal drainage has been clear in color.  She is followed by neurology for her history of migraine headaches.  She underwent a Emgality injection 1 week ago which did not resolve her symptoms at all.  She does not experience any relief with use of home Ubrelvy.  She does state that she will get temporary relief from Tylenol.  She has been using daily Flonase.  She will have occasional nausea and did have an episode of vomiting 3 days ago.  Currently, she denies any nausea.  She does endorse a 9/10 severity headache.  She has not had photosensitivity or noise sensitivity.  She denies any vision changes.  She denies any fevers or chills.    Home Medications Prior to Admission medications   Medication Sig Start Date End Date Taking? Authorizing Provider  acetaminophen (TYLENOL) 500 MG tablet Take 1 tablet (500 mg total) by mouth every 6 (six) hours as needed. 01/11/20   Avegno, Darrelyn Hillock, FNP  amoxicillin-clavulanate (AUGMENTIN) 875-125 MG tablet Take 1 tablet by mouth 2 (two) times daily. 05/13/21   Lindell Spar, MD  atorvastatin (LIPITOR) 80 MG tablet Take 1 tablet by mouth once daily 06/18/21   Lindell Spar, MD  ELIQUIS 5 MG TABS tablet Take 1 tablet by mouth twice daily 10/11/21   Lindell Spar, MD  [facility] 120 MG/ML SOAJ INJECT 120 MG   SUBCUTANEOUSLY ONCE EVERY 30 DAYS 08/26/21   Suzzanne Cloud, NP  fluticasone [facility]) 50 MCG/ACT nasal spray Place 2 sprays into both nostrils daily. 05/13/21   Lindell Spar, MD  ipratropium (ATROVENT) 0.03 % nasal spray Place 2 sprays into both nostrils every 12 (twelve) hours. 11/27/19   Alycia Rossetti, MD  Iron-FA-B Cmp-C-Biot-Probiotic (FUSION PLUS) CAPS Take 2 capsules by mouth once daily 08/26/21   Lindell Spar, MD  JARDIANCE 10 MG TABS tablet TAKE 1 TABLET BY MOUTH ONCE DAILY BEFORE BREAKFAST 10/06/21   Satira Sark, MD  meclizine (ANTIVERT) 25 MG tablet Take 1 tablet (25 mg total) by mouth 3 (three) times daily as needed for dizziness. 01/11/21   Lindell Spar, MD  metFORMIN (GLUCOPHAGE) 500 MG tablet TAKE 1 TABLET BY MOUTH TWICE DAILY WITH A MEAL 08/23/21   Lindell Spar, MD  metoCLOPramide (REGLAN) 10 MG tablet Take 1 tablet (10 mg total) by mouth every 8 (eight) hours as needed for up to 7 days for nausea (Headache). 10/13/18   Noemi Chapel, MD  metoprolol succinate (TOPROL-XL) 50 MG 24 hr tablet TAKE 1 TABLET BY MOUTH  IN THE MORNING AND AT BEDTIME. TAKE WITH OR IMMEDIATELY FOLLOWING A MEAL 06/18/21   Satira Sark, MD  pantoprazole (PROTONIX) 40 MG tablet Take 1 tablet by mouth once daily 10/27/21   Lindell Spar, MD  sacubitril-valsartan [facility])  97-103 MG Take 1 tablet by mouth 2 (two) times daily. NEEDS FOLLOW UP APPOINTMENT FOR MORE REFILLS 10/12/21   Bensimhon, Shaune Pascal, MD  topiramate (TOPAMAX) 50 MG tablet Take 2 tablets (100 mg total) by mouth at bedtime. 10/26/21   Suzzanne Cloud, NP  Ubrogepant (UBRELVY) 50 MG TABS TAKE ONE TABLET BY MOUTH AT THE ONSET OF A MIGRAINE. CAN REPEAT IN 2 HOURS IF NEEDED 06/07/21   Ward Givens, NP      Allergies    Venlafaxine    Review of Systems   Review of Systems  HENT:  Positive for sinus pressure.   Gastrointestinal:  Positive for nausea.  Neurological:  Positive for headaches.  All other systems reviewed and are  negative.   Physical Exam Updated Vital Signs BP (!) 154/100   Pulse 95   Temp 97.6 F (36.4 C) (Oral)   Resp 17   Ht '5\' 2"'$  (1.575 m)   Wt 129.3 kg   SpO2 99%   BMI 52.13 kg/m  Physical Exam Vitals and nursing note reviewed.  Constitutional:      General: She is not in acute distress.    Appearance: She is well-developed. She is not ill-appearing, toxic-appearing or diaphoretic.  HENT:     Head: Normocephalic and atraumatic.     Mouth/Throat:     Mouth: Mucous membranes are moist.     Pharynx: Oropharynx is clear.  Eyes:     Extraocular Movements: Extraocular movements intact.     Right eye: Normal extraocular motion.     Left eye: Normal extraocular motion.     Conjunctiva/sclera: Conjunctivae normal.  Cardiovascular:     Rate and Rhythm: Normal rate and regular rhythm.  Pulmonary:     Effort: Pulmonary effort is normal. No respiratory distress.  Abdominal:     Palpations: Abdomen is soft.     Tenderness: There is no abdominal tenderness.  Musculoskeletal:        General: No swelling. Normal range of motion.     Cervical back: Normal range of motion and neck supple. No rigidity.  Skin:    General: Skin is warm and dry.  Neurological:     Mental Status: She is alert and oriented to person, place, and time.     Cranial Nerves: No cranial nerve deficit, dysarthria or facial asymmetry.     Sensory: No sensory deficit.     Motor: No weakness.     Coordination: Coordination normal.  Psychiatric:        Mood and Affect: Mood normal.        Behavior: Behavior normal.     ED Results / Procedures / Treatments   Labs (all labs ordered are listed, but only abnormal results are displayed) Labs Reviewed  CBC WITH DIFFERENTIAL/PLATELET - Abnormal; Notable for the following components:      Result Value   WBC 11.8 (*)    RBC 5.95 (*)    MCV 73.4 (*)    MCH 22.0 (*)    RDW 16.2 (*)    Neutro Abs 8.6 (*)    All other components within normal limits  BASIC METABOLIC  PANEL - Abnormal; Notable for the following components:   Glucose, Bld 137 (*)    All other components within normal limits    EKG None  Radiology CT HEAD WO CONTRAST  Result Date: 11/02/2021 CLINICAL DATA:  Headache. EXAM: CT HEAD WITHOUT CONTRAST TECHNIQUE: Contiguous axial images were obtained from the base of the skull through  the vertex without intravenous contrast. RADIATION DOSE REDUCTION: This exam was performed according to the departmental dose-optimization program which includes automated exposure control, adjustment of the mA and/or kV according to patient size and/or use of iterative reconstruction technique. COMPARISON:  08/18/2021 FINDINGS: Brain: There is no evidence for acute hemorrhage, hydrocephalus, mass lesion, or abnormal extra-axial fluid collection. No definite CT evidence for acute infarction. Patchy low attenuation in the deep hemispheric and periventricular white matter is nonspecific, but likely reflects chronic microvascular ischemic demyelination. Vascular: No hyperdense vessel or unexpected calcification. Skull: No evidence for fracture. No worrisome lytic or sclerotic lesion. Sinuses/Orbits: The visualized paranasal sinuses and mastoid air cells are clear. Visualized portions of the globes and intraorbital fat are unremarkable. Other: None IMPRESSION: Stable.  No acute intracranial abnormality. Electronically Signed   By: Misty Stanley M.D.   On: 11/02/2021 10:19    Procedures Procedures    Medications Ordered in ED Medications  ketorolac (TORADOL) 15 MG/ML injection 15 mg (15 mg Intravenous Given 11/02/21 0936)  prochlorperazine (COMPAZINE) injection 10 mg (10 mg Intravenous Given 11/02/21 0937)  diphenhydrAMINE (BENADRYL) injection 12.5 mg (12.5 mg Intravenous Given 11/02/21 0934)  lactated ringers bolus 1,000 mL (1,000 mLs Intravenous New Bag/Given 11/02/21 0931)  magnesium sulfate IVPB 1 g 100 mL (1 g Intravenous New Bag/Given 11/02/21 1021)  dexamethasone (DECADRON)  injection 10 mg (10 mg Intravenous Given 11/02/21 0932)    ED Course/ Medical Decision Making/ A&P                           Medical Decision Making Amount and/or Complexity of Data Reviewed Labs: ordered. Radiology: ordered.  Risk Prescription drug management.   This patient presents to the ED for concern of headache, this involves an extensive number of treatment options, and is a complaint that carries with it a high risk of complications and morbidity.  The differential diagnosis includes migraine headache, CVA, ICH, aneurysm, venous thrombosis, IIH, sinusitis   Co morbidities that complicate the patient evaluation  HLD, obesity, anemia, migraines, HTN, GERD, DM, depression, anxiety, CAD, OSA, and asthma   Additional history obtained:  Additional history obtained from N/A External records from outside source obtained and reviewed including EMR   Lab Tests:  I Ordered, and personally interpreted labs.  The pertinent results include: Slight leukocytosis, normal hemoglobin with baseline microcytosis, normal electrolytes   Imaging Studies ordered:  I ordered imaging studies including CT head I independently visualized and interpreted imaging which showed no acute findings I agree with the radiologist interpretation   Cardiac Monitoring: / EKG:  The patient was maintained on a cardiac monitor.  I personally viewed and interpreted the cardiac monitored which showed an underlying rhythm of: Sinus rhythm  Problem List / ED Course / Critical interventions / Medication management  Patient presents for persistent headache over the past 2 weeks.  She does experience some relief with Tylenol.  She endorses a vertex headache with some a right temporal pain.  She does state that it is consistent with prior migraine headaches.  On arrival, patient is well-appearing.  Her vital signs are notable for hypertension.  She has no focal neurologic deficits.  She has no temporal tenderness.   She denies any jaw claudication.  Currently, she does take Eliquis.  This lower suspicion for thrombotic phenomenon causing her symptoms.  Patient was given headache cocktail for symptomatic relief.  She did undergo basic lab work and CT of head, results of  which were unremarkable.  Although she has had sinus pressure lately, there was no evidence of fluid accumulation in her sinuses on CT scan.  Her sinus tenderness on exam is minimal.  She endorses only clear discharge.  Following headache cocktail in the ED, she does report resolution of headache.  Patient does feel comfortable with discharge home at this time.  She was advised to return to the ED for any new or worsening symptoms.  She was discharged in good condition. I ordered medication including IV fluids, magnesium sulfate, Compazine, Toradol, Benadryl, Decadron for headache Reevaluation of the patient after these medicines showed that the patient resolved I have reviewed the patients home medicines and have made adjustments as needed   Social Determinants of Health:  Has access to outpatient care, including neurology for her headaches        Final Clinical Impression(s) / ED Diagnoses Final diagnoses:  Bad headache    Rx / DC Orders ED Discharge Orders     None         Godfrey Pick, MD 11/02/21 1136

## 2021-11-02 NOTE — Discharge Instructions (Signed)
Continue your home medications as prescribed.  Take ibuprofen and Tylenol as needed for recurrence of headache.  Return to the ED for any recurrence of severe symptoms.

## 2021-11-03 ENCOUNTER — Telehealth: Payer: Self-pay | Admitting: *Deleted

## 2021-11-03 ENCOUNTER — Telehealth: Payer: Self-pay | Admitting: Neurology

## 2021-11-03 NOTE — Telephone Encounter (Signed)
Patient was in the ER yesterday 11/02/2021 for headache.  She was given IV cocktail of IV fluids, magnesium sulfate, Compazine, Toradol, Benadryl, Decadron with resolution of headache.  CT head showed no acute abnormality.  Please call to check on her, see how her headaches have been doing with Emgality, Topamax 100 mg at bedtime. Has she been to see ophthalmology? Give her earlier follow up with me or Dr. Krista Blue if needed.

## 2021-11-03 NOTE — Telephone Encounter (Signed)
I spoke to the patient. Her migraine is nearly resolved with the exception of a little soreness to the top of her head. The migraine cocktail worked well.  States last month, she had LASIK tune-up in left eye (from having a cataract extraction years ago). Her ophthalmologist is Dr. Herschel Senegal w/ [facility]. She has a checkup two weeks ago. States she was told her eyes looked good. No abnormalities noted. Reports he told her she is dealing with migraines.   She has been having more frequent headaches. She has continued both Emgality and topiramate, as prescribed. She was only able to get five tablets of Ubrelvy last month. She is unsure why the low amount. However, she is now 66 years old and has a new plan with [employer]. The quantity should no longer be an issue.   She would like to come in for a sooner appt due to the frequency and intensity of her migraines. Scheduled on 11/10/21.

## 2021-11-03 NOTE — Telephone Encounter (Signed)
PA for Emgality '120mg'$  started on covermymeds (key: [id number]). PA approved through 05/01/2022.  PA for Ubrelvy '50mg'$  started on covermymeds (key" [id number]). PA approved through 05/01/2022.   Pharmacy coverage through [place] [phone]).

## 2021-11-10 ENCOUNTER — Ambulatory Visit (INDEPENDENT_AMBULATORY_CARE_PROVIDER_SITE_OTHER): Payer: Medicare HMO | Admitting: Internal Medicine

## 2021-11-10 ENCOUNTER — Telehealth: Payer: Self-pay | Admitting: Neurology

## 2021-11-10 ENCOUNTER — Encounter: Payer: Self-pay | Admitting: Internal Medicine

## 2021-11-10 ENCOUNTER — Other Ambulatory Visit (HOSPITAL_COMMUNITY): Payer: Self-pay | Admitting: Internal Medicine

## 2021-11-10 ENCOUNTER — Encounter (HOSPITAL_COMMUNITY): Payer: Self-pay | Admitting: Hematology

## 2021-11-10 ENCOUNTER — Encounter: Payer: Self-pay | Admitting: Neurology

## 2021-11-10 ENCOUNTER — Ambulatory Visit: Payer: Medicare HMO | Admitting: Neurology

## 2021-11-10 VITALS — BP 136/88 | HR 89 | Resp 18 | Ht 62.0 in | Wt 278.2 lb

## 2021-11-10 VITALS — BP 127/77 | HR 101 | Ht 62.0 in | Wt 278.5 lb

## 2021-11-10 DIAGNOSIS — E785 Hyperlipidemia, unspecified: Secondary | ICD-10-CM

## 2021-11-10 DIAGNOSIS — Z23 Encounter for immunization: Secondary | ICD-10-CM

## 2021-11-10 DIAGNOSIS — Z1231 Encounter for screening mammogram for malignant neoplasm of breast: Secondary | ICD-10-CM

## 2021-11-10 DIAGNOSIS — I517 Cardiomegaly: Secondary | ICD-10-CM | POA: Diagnosis not present

## 2021-11-10 DIAGNOSIS — I10 Essential (primary) hypertension: Secondary | ICD-10-CM | POA: Diagnosis not present

## 2021-11-10 DIAGNOSIS — E1169 Type 2 diabetes mellitus with other specified complication: Secondary | ICD-10-CM

## 2021-11-10 DIAGNOSIS — J309 Allergic rhinitis, unspecified: Secondary | ICD-10-CM

## 2021-11-10 DIAGNOSIS — D6869 Other thrombophilia: Secondary | ICD-10-CM

## 2021-11-10 DIAGNOSIS — I480 Paroxysmal atrial fibrillation: Secondary | ICD-10-CM | POA: Diagnosis not present

## 2021-11-10 DIAGNOSIS — E559 Vitamin D deficiency, unspecified: Secondary | ICD-10-CM | POA: Diagnosis not present

## 2021-11-10 DIAGNOSIS — Z780 Asymptomatic menopausal state: Secondary | ICD-10-CM | POA: Diagnosis not present

## 2021-11-10 DIAGNOSIS — G43709 Chronic migraine without aura, not intractable, without status migrainosus: Secondary | ICD-10-CM

## 2021-11-10 LAB — POCT GLYCOSYLATED HEMOGLOBIN (HGB A1C)
HbA1c POC (<> result, manual entry): 6.6 % (ref 4.0–5.6)
HbA1c, POC (controlled diabetic range): 6.6 % (ref 0.0–7.0)

## 2021-11-10 MED ORDER — EMGALITY 120 MG/ML ~~LOC~~ SOAJ: SUBCUTANEOUS | 11 refills | Status: DC

## 2021-11-10 MED ORDER — UBRELVY 50 MG PO TABS: ORAL_TABLET | ORAL | 11 refills | Status: DC

## 2021-11-10 MED ORDER — BLOOD GLUCOSE METER KIT: PACK | 0 refills | Status: DC

## 2021-11-10 MED ORDER — TOPIRAMATE 50 MG PO TABS: 100.0000 mg | ORAL_TABLET | Freq: Every day | ORAL | 3 refills | Status: DC

## 2021-11-10 NOTE — Assessment & Plan Note (Signed)
Diet modification advised Mobility limited due to exertional dyspnea 

## 2021-11-10 NOTE — Telephone Encounter (Signed)
Can we please get patient set up for Botox for migraine headaches?  Please see recent office visit for tried and failed.

## 2021-11-10 NOTE — Assessment & Plan Note (Signed)
BP Readings from Last 1 Encounters:  11/10/21 127/77   Well-controlled with Entresto and Metoprolol Counseled for compliance with the medications Advised DASH diet and moderate exercise/walking, at least 150 mins/week

## 2021-11-10 NOTE — Assessment & Plan Note (Signed)
Currently in sinus rhythm On Toprol and Eliquis Follows up with Cardiologist 

## 2021-11-10 NOTE — Assessment & Plan Note (Signed)
Has chronic nasal congestion and postnasal drip, likely allergic rhinitis Needs to continue Flonase and Zyrtec Has seen Dr. Benjamine Mola

## 2021-11-10 NOTE — Telephone Encounter (Signed)
Completed [employer] Botox PA form, placed in Nurse Pod for signature.

## 2021-11-10 NOTE — Assessment & Plan Note (Addendum)
On Eliquis for history of paroxysmal atrial fibrillation No signs of active bleeding currently Check CBC

## 2021-11-10 NOTE — Assessment & Plan Note (Signed)
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 

## 2021-11-10 NOTE — Progress Notes (Signed)
Established Patient Office Visit  Subjective:  Patient ID: Monique Gomez, female    DOB: 1955-11-24  Age: 66 y.o. MRN: [id number]  CC:  Chief Complaint  Patient presents with   Follow-up    6 month follow up HTN and DM has been having headaches but sees neurologist today    HPI Monique Gomez is a 66 y.o. female with past medical history of paroxysmal atrial fibrillation, hypertension, myocarditis, OSA on CPAP, GERD, DM, HLD and morbid obesity who presents for f/u of her chronic medical conditions.  BP is well-controlled. Takes medications regularly. Patient denies  chest pain, dyspnea or palpitations.  She takes Entresto for history of  hypertrophic cardiomegaly.  She is followed by cardiology and heart failure clinic.  She has history of DM, for which she takes metformin.  She has been also taking Jardiance for history of myocarditis/CHF.  She denies any fatigue, polyuria or polydipsia currently.  She uses CPAP for OSA.  She complains of episodes of headache, worse recently.  She is on Emgality for migraine.   Past Medical History:  Diagnosis Date   Abdominal fibromatosis    Allergic rhinitis    Anemia    Asthma    Essential hypertension    GERD (gastroesophageal reflux disease)    History of cardiac catheterization    No significant CAD May 2015   History of migraine headaches    Hyperlipidemia    Iron deficiency anemia 02/05/2021   Myocarditis ([address])    a. diagnosed in 05/2020 with cath showing normal cors   Obesity    PAF (paroxysmal atrial fibrillation) ([place]) 08/2013   Sleep apnea    Type 2 diabetes mellitus [facility])     Past Surgical History:  Procedure Laterality Date   CATARACT EXTRACTION     CATARACT EXTRACTION W/PHACO Right 04/05/2019   Procedure: CATARACT EXTRACTION PHACO AND INTRAOCULAR LENS PLACEMENT RIGHT EYE (CDE: 3.19);  Surgeon: Baruch Goldmann, MD;  Location: [facility];  Service: Ophthalmology;  Laterality: Right;   COLONOSCOPY N/A 02/02/2015    SLF: 1. one colon polyp removed-no source for anemia identified. 2. moderate diverticulosis noted in the sigmoid colon and descending colon 3. the left colon is redundant 4. Rectal bleeding due ot small internal hemorroids 5. Moderate sized external hemorrhoids.   ESOPHAGOGASTRODUODENOSCOPY N/A 02/02/2015   SLF: 1. 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 large hiatal hernia 3. mild non-erosive gastritis 4. No source for anemia identified.    LEFT HEART CATH AND CORONARY ANGIOGRAPHY N/A 05/14/2020   Procedure: LEFT HEART CATH AND CORONARY ANGIOGRAPHY;  Surgeon: Belva Crome, MD;  Location: [place] [facility];  Service: Cardiovascular;  Laterality: N/A;   LEFT HEART CATHETERIZATION WITH CORONARY ANGIOGRAM N/A 09/03/2013   Procedure: LEFT HEART CATHETERIZATION WITH CORONARY ANGIOGRAM;  Surgeon: Jettie Booze, MD;  Location: [facility] CATH LAB;  Service: Cardiovascular;  Laterality: N/A;   Right carpal tunnel release      Family History  Problem Relation Age of Onset   Diabetes Mother    Hypertension Mother    Heart disease Mother    High Cholesterol Mother    Alzheimer's disease Father    Hypertension Sister    Hypertension Brother    Diabetes Brother    Arthritis Sister    Hypertension Sister    High Cholesterol Sister    Diabetes Son    Developmental delay Son    Colon cancer Neg Hx     Social History   Socioeconomic  History   Marital status: Single    Spouse name: Not on file   Number of children: 2   Years of education: Not on file   Highest education level: Not on file  Occupational History   Occupation: Education officer, museum    Employer: [employer]  Tobacco Use   Smoking status: Former    Packs/day: 0.30    Types: Cigarettes    Quit date: 04/01/2012    Years since quitting: 9.6   Smokeless tobacco: Never  Vaping Use   Vaping Use: Never used  Substance and Sexual Activity   Alcohol use: Not Currently    Alcohol/week: 0.0 standard drinks of alcohol     Comment: very occasional   Drug use: No   Sexual activity: Yes  Other Topics Concern   Not on file  Social History Narrative   Not on file   Social Determinants of Health   Financial Resource Strain: Not on file  Food Insecurity: Not on file  Transportation Needs: Not on file  Physical Activity: Not on file  Stress: Not on file  Social Connections: Not on file  Intimate Partner Violence: Not on file    Outpatient Medications Prior to Visit  Medication Sig Dispense Refill   acetaminophen (TYLENOL) 500 MG tablet Take 1 tablet (500 mg total) by mouth every 6 (six) hours as needed. 30 tablet 0   atorvastatin (LIPITOR) 80 MG tablet Take 1 tablet by mouth once daily 90 tablet 3   ELIQUIS 5 MG TABS tablet Take 1 tablet by mouth twice daily 180 tablet 0   fluticasone (FLONASE) 50 MCG/ACT nasal spray Place 2 sprays into both nostrils daily. 16 g 5   ipratropium (ATROVENT) 0.03 % nasal spray Place 2 sprays into both nostrils every 12 (twelve) hours. 30 mL 3   Iron-FA-B Cmp-C-Biot-Probiotic (FUSION PLUS) CAPS Take 2 capsules by mouth once daily 60 capsule 11   JARDIANCE 10 MG TABS tablet TAKE 1 TABLET BY MOUTH ONCE DAILY BEFORE BREAKFAST 90 tablet 3   meclizine (ANTIVERT) 25 MG tablet Take 1 tablet (25 mg total) by mouth 3 (three) times daily as needed for dizziness. (Patient not taking: Reported on 11/10/2021) 30 tablet 0   metFORMIN (GLUCOPHAGE) 500 MG tablet TAKE 1 TABLET BY MOUTH TWICE DAILY WITH A MEAL 180 tablet 0   metoCLOPramide (REGLAN) 10 MG tablet Take 1 tablet (10 mg total) by mouth every 8 (eight) hours as needed for up to 7 days for nausea (Headache). 21 tablet 0   metoprolol succinate (TOPROL-XL) 50 MG 24 hr tablet TAKE 1 TABLET BY MOUTH  IN THE MORNING AND AT BEDTIME. TAKE WITH OR IMMEDIATELY FOLLOWING A MEAL 180 tablet 3   pantoprazole (PROTONIX) 40 MG tablet Take 1 tablet by mouth once daily 90 tablet 0   sacubitril-valsartan (ENTRESTO) 97-103 MG Take 1 tablet by mouth 2 (two)  times daily. NEEDS FOLLOW UP APPOINTMENT FOR MORE REFILLS 180 tablet 0   EMGALITY 120 MG/ML SOAJ INJECT 120 MG  SUBCUTANEOUSLY ONCE EVERY 30 DAYS 1 mL 4   topiramate (TOPAMAX) 50 MG tablet Take 2 tablets (100 mg total) by mouth at bedtime. 60 tablet 1   Ubrogepant (UBRELVY) 50 MG TABS TAKE ONE TABLET BY MOUTH AT THE ONSET OF A MIGRAINE. CAN REPEAT IN 2 HOURS IF NEEDED 10 tablet 7   amoxicillin-clavulanate (AUGMENTIN) 875-125 MG tablet Take 1 tablet by mouth 2 (two) times daily. (Patient not taking: Reported on 11/10/2021) 14 tablet 0   No  facility-administered medications prior to visit.    Allergies  Allergen Reactions   Venlafaxine Nausea And Vomiting and Other (See Comments)    Dizziness, shakiness    ROS Review of Systems  Constitutional:  Negative for chills and fever.  HENT:  Positive for sinus pain. Negative for sore throat.   Eyes:  Negative for pain and discharge.  Respiratory:  Negative for cough and shortness of breath.   Cardiovascular:  Negative for chest pain and palpitations.  Gastrointestinal:  Negative for abdominal pain, diarrhea, nausea and vomiting.  Endocrine: Negative for polydipsia and polyuria.  Genitourinary:  Negative for dysuria and hematuria.  Musculoskeletal:  Positive for back pain. Negative for neck pain and neck stiffness.  Skin:  Negative for rash.  Neurological:  Positive for dizziness and headaches. Negative for weakness.  Psychiatric/Behavioral:  Negative for agitation and behavioral problems.       Objective:    Physical Exam Vitals reviewed.  Constitutional:      General: She is not in acute distress.    Appearance: She is obese. She is not diaphoretic.  HENT:     Head: Normocephalic and atraumatic.     Nose: Congestion present.     Mouth/Throat:     Mouth: Mucous membranes are moist.  Eyes:     General: No scleral icterus.    Extraocular Movements: Extraocular movements intact.  Cardiovascular:     Rate and Rhythm: Normal rate and  regular rhythm.     Pulses: Normal pulses.     Heart sounds: Normal heart sounds. No murmur heard. Pulmonary:     Breath sounds: Normal breath sounds. No wheezing or rales.  Musculoskeletal:     Cervical back: Neck supple. No tenderness.     Right lower leg: No edema.     Left lower leg: No edema.  Skin:    General: Skin is warm.     Findings: Erythema (Chronic, over right side of face) present. No rash.  Neurological:     General: No focal deficit present.     Mental Status: She is alert and oriented to person, place, and time.     Cranial Nerves: No cranial nerve deficit.     Sensory: No sensory deficit.     Motor: No weakness.  Psychiatric:        Mood and Affect: Mood normal.        Behavior: Behavior normal.     BP 136/88 (BP Location: Left Arm)   Pulse 89   Resp 18   Ht '5\' 2"'  (1.575 m)   Wt 278 lb 3.2 oz (126.2 kg)   SpO2 99%   BMI 50.88 kg/m  Wt Readings from Last 3 Encounters:  11/10/21 278 lb 8 oz (126.3 kg)  11/10/21 278 lb 3.2 oz (126.2 kg)  11/02/21 285 lb (129.3 kg)    Lab Results  Component Value Date   TSH 2.590 05/13/2021   Lab Results  Component Value Date   WBC 11.8 (H) 11/02/2021   HGB 13.1 11/02/2021   HCT 43.7 11/02/2021   MCV 73.4 (L) 11/02/2021   PLT 304 11/02/2021   Lab Results  Component Value Date   NA 140 11/02/2021   K 3.6 11/02/2021   CO2 23 11/02/2021   GLUCOSE 137 (H) 11/02/2021   BUN 20 11/02/2021   CREATININE 0.94 11/02/2021   BILITOT <0.2 05/13/2021   ALKPHOS 100 05/13/2021   AST 16 05/13/2021   ALT 14 05/13/2021   PROT 7.1 05/13/2021  ALBUMIN 4.1 05/13/2021   CALCIUM 9.0 11/02/2021   ANIONGAP 8 11/02/2021   EGFR 75 05/13/2021   Lab Results  Component Value Date   CHOL 174 05/13/2021   Lab Results  Component Value Date   HDL 41 05/13/2021   Lab Results  Component Value Date   LDLCALC 114 (H) 05/13/2021   Lab Results  Component Value Date   TRIG 101 05/13/2021   Lab Results  Component Value Date    CHOLHDL 4.2 05/13/2021   Lab Results  Component Value Date   HGBA1C 6.6 11/10/2021   HGBA1C 6.6 11/10/2021      Assessment & Plan:   Problem List Items Addressed This Visit       Cardiovascular and Mediastinum   HTN (hypertension) (Chronic)    BP Readings from Last 1 Encounters:  11/10/21 127/77  Well-controlled with Entresto and Metoprolol Counseled for compliance with the medications Advised DASH diet and moderate exercise/walking, at least 150 mins/week      Relevant Orders   TSH   CBC with Differential/Platelet   PAF (paroxysmal atrial fibrillation) (HCC) (Chronic)    Currently in sinus rhythm On Toprol and Eliquis Follows up with Cardiologist      Relevant Orders   TSH   CBC with Differential/Platelet   Hypertrophic cardiomegaly    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 Benjamine Mola        Endocrine   DM (diabetes mellitus) ([place]) - Primary (Chronic)    Lab Results  Component Value Date   HGBA1C 6.6 11/10/2021   HGBA1C 6.6 11/10/2021  Well-controlled On Metformin and Jardiance On statin Has an Ophthalmologist      Relevant Orders   Microalbumin / creatinine urine ratio   Hemoglobin A1c   CMP14+EGFR   CBC with Differential/Platelet   POCT glycosylated hemoglobin (Hb A1C) (Completed)     Hematopoietic and Hemostatic   Acquired thrombophilia ([place])    On Eliquis for history of paroxysmal atrial fibrillation No signs of active bleeding currently Check CBC      Relevant Orders   CBC with Differential/Platelet     Other   Hyperlipidemia (Chronic)    On Atorvastatin      Relevant Orders   Lipid panel    Morbid obesity ([place])    Diet modification advised Mobility limited due to exertional dyspnea      Vitamin D deficiency   Relevant Orders   VITAMIN D 25 Hydroxy (Vit-D Deficiency, Fractures)   Other Visit Diagnoses     Post-menopausal       Relevant Orders   DG Bone Density   Need for pneumococcal 20-valent conjugate vaccination       Relevant Orders   Pneumococcal conjugate vaccine 20-valent (Prevnar 20) (Completed)       Meds ordered this encounter  Medications   blood glucose meter kit and supplies    Sig: Dispense based on patient and insurance preference. Use up to four times daily as directed. (FOR ICD-10 E10.9, E11.9).    Dispense:  1 each    Refill:  0    Order Specific Question:   Number of strips  Answer:   100    Order Specific Question:   Number of lancets    Answer:   100    Follow-up: Return in about 6 months (around 05/13/2022) for Annual physical.    Lindell Spar, MD

## 2021-11-10 NOTE — Progress Notes (Signed)
Patient: Monique Gomez Date of Birth: 10-17-1955  Reason for Visit: Follow up History from: Patient Primary Neurologist: Dr. Krista Blue   ASSESSMENT AND PLAN 66 y.o. year old female   1.  Chronic migraine headache -Continue Emgality, Topamax for migraine preventative, add on Botox injection for better control of migraines, continue Ubrelvy for acute headache treatment -Currently experiencing daily headache -Previously tried and failed: Aimovig, Effexor, Imitrex, Maxalt, Fioricet, diclofenac, metoprolol -I will see her back for Botox injection -MRI of the brain 09/15/21 showed no acute abnormality, has been evaluated by ophthalmology, ENT  HISTORY OF PRESENT ILLNESS: Today 11/10/21 [name] here today for follow-up.  MRI of the brain in May 2023 showed no acute abnormality, it did show mild chronic small vessel ischemic changes, partially empty sella turcica. In the ER 11/02/21 for headache, she was given IV cocktail of IV fluids, magnesium sulfate, Compazine, Toradol, Benadryl, Decadron with resolution of headache.  CT head showed no acute abnormality. Has seen ENT, told inflamed nasal passages. On Flonase. Was told in ER, Flonase causes rebound headaches, when she stopped had swelling of her face. Headaches top of her head, temples. [name] helps, but has to use sparingly. Tylenol helps but not as good as Ubrelvy. On Emgality, has done 3 injections so far, not much change. Cannot identify any triggers for migraines, doesn't have any stress, she is retired. Can have dizziness, nausea, vomiting, sometimes sensitive to light/sound. This is her typical migraine pattern.   HISTORY  08/25/21 SS: Here today for follow-up, she couldn't get the virtual visit to work x 3. Sooner appointment. Reports for months, pressure around her eyes, dizziness felt related to sinuses. Sinuses were swollen hard to assess, took oral steroids x 7 days, things cleared. Using nasal spray and Zyrtec. Reports  tingling around lips, feels pressure behind eyes. Not having migraine headaches. Reports pain to occipital area, crown, is like pounding. Is now retired. 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 [name] with good benefit or Tylenol. She thought all of this was related to sinus issues, but ENT assured her not. Told to come see neurologist. Migraines are typically severe pain, sensitive to light, nauseated. Current headaches are sporadic, few times a week, lasts until [place] or Tylenol. No exacerbating factors. Feel better when lying down. Denies numbness or weakness, no trouble with gait.   REVIEW OF SYSTEMS: Out of a complete 14 system review of symptoms, the patient complains only of the following symptoms, and all other reviewed systems are negative.  See HPI  ALLERGIES: Allergies  Allergen Reactions   Venlafaxine Nausea And Vomiting and Other (See Comments)    Dizziness, shakiness    HOME MEDICATIONS: Outpatient Medications Prior to Visit  Medication Sig Dispense Refill   acetaminophen (TYLENOL) 500 MG tablet Take 1 tablet (500 mg total) by mouth every 6 (six) hours as needed. 30 tablet 0   atorvastatin (LIPITOR) 80 MG tablet Take 1 tablet by mouth once daily 90 tablet 3   blood glucose meter kit and supplies Dispense based on patient and insurance preference. Use up to four times daily as directed. (FOR ICD-10 E10.9, E11.9). 1 each 0   ELIQUIS 5 MG TABS tablet Take 1 tablet by mouth twice daily 180 tablet 0   fluticasone (FLONASE) 50 MCG/ACT nasal spray Place 2 sprays into both nostrils daily. 16 g 5   ipratropium (ATROVENT) 0.03 % nasal spray Place 2 sprays into both nostrils every 12 (twelve) hours.  30 mL 3   Iron-FA-B Cmp-C-Biot-Probiotic (FUSION PLUS) CAPS Take 2 capsules by mouth once daily 60 capsule 11   JARDIANCE 10 MG TABS tablet TAKE 1 TABLET BY MOUTH ONCE DAILY BEFORE BREAKFAST 90 tablet 3   metFORMIN (GLUCOPHAGE) 500 MG tablet TAKE 1  TABLET BY MOUTH TWICE DAILY WITH A MEAL 180 tablet 0   metoCLOPramide (REGLAN) 10 MG tablet Take 1 tablet (10 mg total) by mouth every 8 (eight) hours as needed for up to 7 days for nausea (Headache). 21 tablet 0   metoprolol succinate (TOPROL-XL) 50 MG 24 hr tablet TAKE 1 TABLET BY MOUTH  IN THE MORNING AND AT BEDTIME. TAKE WITH OR IMMEDIATELY FOLLOWING A MEAL 180 tablet 3   pantoprazole (PROTONIX) 40 MG tablet Take 1 tablet by mouth once daily 90 tablet 0   sacubitril-valsartan (ENTRESTO) 97-103 MG Take 1 tablet by mouth 2 (two) times daily. NEEDS FOLLOW UP APPOINTMENT FOR MORE REFILLS 180 tablet 0   EMGALITY 120 MG/ML SOAJ INJECT 120 MG  SUBCUTANEOUSLY ONCE EVERY 30 DAYS 1 mL 4   topiramate (TOPAMAX) 50 MG tablet Take 2 tablets (100 mg total) by mouth at bedtime. 60 tablet 1   Ubrogepant (UBRELVY) 50 MG TABS TAKE ONE TABLET BY MOUTH AT THE ONSET OF A MIGRAINE. CAN REPEAT IN 2 HOURS IF NEEDED 10 tablet 7   meclizine (ANTIVERT) 25 MG tablet Take 1 tablet (25 mg total) by mouth 3 (three) times daily as needed for dizziness. (Patient not taking: Reported on 11/10/2021) 30 tablet 0   No facility-administered medications prior to visit.    PAST MEDICAL HISTORY: Past Medical History:  Diagnosis Date   Abdominal fibromatosis    Allergic rhinitis    Anemia    Asthma    Essential hypertension    GERD (gastroesophageal reflux disease)    History of cardiac catheterization    No significant CAD May 2015   History of migraine headaches    Hyperlipidemia    Iron deficiency anemia 02/05/2021   Myocarditis ([place])    a. diagnosed in 05/2020 with cath showing normal cors   Obesity    PAF (paroxysmal atrial fibrillation) ([place]) 08/2013   Sleep apnea    Type 2 diabetes mellitus ([place])     PAST SURGICAL HISTORY: Past Surgical History:  Procedure Laterality Date   CATARACT EXTRACTION     CATARACT EXTRACTION W/PHACO Right 04/05/2019   Procedure: CATARACT EXTRACTION PHACO AND INTRAOCULAR LENS PLACEMENT  RIGHT EYE (CDE: 3.19);  Surgeon: Baruch Goldmann, MD;  Location: [facility];  Service: Ophthalmology;  Laterality: Right;   COLONOSCOPY N/A 02/02/2015   SLF: 1. one colon polyp removed-no source for anemia identified. 2. moderate diverticulosis noted in the sigmoid colon and descending colon 3. the left colon is redundant 4. Rectal bleeding due ot small internal hemorroids 5. Moderate sized external hemorrhoids.   ESOPHAGOGASTRODUODENOSCOPY N/A 02/02/2015   SLF: 1. 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 large hiatal hernia 3. mild non-erosive gastritis 4. No source for anemia identified.    LEFT HEART CATH AND CORONARY ANGIOGRAPHY N/A 05/14/2020   Procedure: LEFT HEART CATH AND CORONARY ANGIOGRAPHY;  Surgeon: Belva Crome, MD;  Location: [place] [facility];  Service: Cardiovascular;  Laterality: N/A;   LEFT HEART CATHETERIZATION WITH CORONARY ANGIOGRAM N/A 09/03/2013   Procedure: LEFT HEART CATHETERIZATION WITH CORONARY ANGIOGRAM;  Surgeon: Jettie Booze, MD;  Location: [facility] CATH LAB;  Service: Cardiovascular;  Laterality: N/A;   Right carpal tunnel release  FAMILY HISTORY: Family History  Problem Relation Age of Onset   Diabetes Mother    Hypertension Mother    Heart disease Mother    High Cholesterol Mother    Alzheimer's disease Father    Hypertension Sister    Hypertension Brother    Diabetes Brother    Arthritis Sister    Hypertension Sister    High Cholesterol Sister    Diabetes Son    Developmental delay Son    Colon cancer Neg Hx     SOCIAL HISTORY: Social History   Socioeconomic History   Marital status: Single    Spouse name: Not on file   Number of children: 2   Years of education: Not on file   Highest education level: Not on file  Occupational History   Occupation: Education officer, museum    Employer: [employer]  Tobacco Use   Smoking status: Former    Packs/day: 0.30    Types: Cigarettes    Quit date: 04/01/2012    Years since quitting: 9.6    Smokeless tobacco: Never  Vaping Use   Vaping Use: Never used  Substance and Sexual Activity   Alcohol use: Not Currently    Alcohol/week: 0.0 standard drinks of alcohol    Comment: very occasional   Drug use: No   Sexual activity: Yes  Other Topics Concern   Not on file  Social History Narrative   Not on file   Social Determinants of Health   Financial Resource Strain: Not on file  Food Insecurity: Not on file  Transportation Needs: Not on file  Physical Activity: Not on file  Stress: Not on file  Social Connections: Not on file  Intimate Partner Violence: Not on file    PHYSICAL EXAM  Vitals:   11/10/21 1504  BP: 127/77  Pulse: (!) 101  Weight: 278 lb 8 oz (126.3 kg)  Height: _0  (1.575 m)   Body mass index is 50.94 kg/m.  Generalized: Well developed, in no acute distress  Neurological examination  Mentation: Alert oriented to time, place, history taking. Follows all commands speech and language fluent Cranial nerve II-XII: Pupils were equal round reactive to light. Extraocular movements were full, visual field were full on confrontational test. Facial sensation and strength were normal. Head turning and shoulder shrug  were normal and symmetric. Motor: The motor testing reveals 5 over 5 strength of all 4 extremities. Good symmetric motor tone is noted throughout.  Sensory: Sensory testing is intact to soft touch on all 4 extremities. No evidence of extinction is noted.  Coordination: Cerebellar testing reveals good finger-nose-finger and heel-to-shin bilaterally.  Gait and station: Gait is normal. Tandem gait is normal.   DIAGNOSTIC DATA (LABS, IMAGING, TESTING) - I reviewed patient records, labs, notes, testing and imaging myself where available.  Lab Results  Component Value Date   WBC 11.8 (H) 11/02/2021   HGB 13.1 11/02/2021   HCT 43.7 11/02/2021   MCV 73.4 (L) 11/02/2021   PLT 304 11/02/2021      Component Value Date/Time   NA 140 11/02/2021 0926    NA 146 (H) 05/13/2021 0848   K 3.6 11/02/2021 0926   CL 109 11/02/2021 0926   CO2 23 11/02/2021 0926   GLUCOSE 137 (H) 11/02/2021 0926   BUN 20 11/02/2021 0926   BUN 13 05/13/2021 0848   CREATININE 0.94 11/02/2021 0926   CREATININE 0.84 12/18/2019 0822   CALCIUM 9.0 11/02/2021 0926   PROT 7.1 05/13/2021 0848  ALBUMIN 4.1 05/13/2021 0848   AST 16 05/13/2021 0848   ALT 14 05/13/2021 0848   ALKPHOS 100 05/13/2021 0848   BILITOT <0.2 05/13/2021 0848   GFRNONAA >60 11/02/2021 0926   GFRNONAA 83 10/23/2017 0811   GFRAA >60 10/15/2019 0850   GFRAA 97 10/23/2017 0811   Lab Results  Component Value Date   CHOL 174 05/13/2021   HDL 41 05/13/2021   LDLCALC 114 (H) 05/13/2021   TRIG 101 05/13/2021   CHOLHDL 4.2 05/13/2021   Lab Results  Component Value Date   HGBA1C 6.6 11/10/2021   HGBA1C 6.6 11/10/2021   Lab Results  Component Value Date   VITAMINB12 401 05/15/2020   Lab Results  Component Value Date   TSH 2.590 05/13/2021    Butler Denmark, AGNP-C, DNP 11/10/2021, 3:55 PM [facility] [address] [place], [place] [address] [phone]

## 2021-11-10 NOTE — Assessment & Plan Note (Signed)
On Atorvastatin 

## 2021-11-10 NOTE — Assessment & Plan Note (Addendum)
Lab Results  Component Value Date   HGBA1C 6.6 11/10/2021   HGBA1C 6.6 11/10/2021   Well-controlled On Metformin and Jardiance On statin Has an Ophthalmologist

## 2021-11-10 NOTE — Patient Instructions (Signed)
Please continue taking medications as prescribed.  Please continue to follow low carb and low salt diet and ambulate as tolerated.  Please consider getting Shingrix vaccine at your local pharmacy.  Please get fasting blood tests done before the next visit.

## 2021-11-11 ENCOUNTER — Encounter (HOSPITAL_COMMUNITY): Payer: Self-pay | Admitting: Hematology

## 2021-11-11 NOTE — Telephone Encounter (Signed)
Faxed signed PA with OV notes to [facility].

## 2021-11-13 ENCOUNTER — Other Ambulatory Visit: Payer: Self-pay | Admitting: Internal Medicine

## 2021-11-15 ENCOUNTER — Encounter: Payer: Self-pay | Admitting: Neurology

## 2021-11-15 NOTE — Telephone Encounter (Signed)
I called pt to get her appt scheduled, however pt has a bad debt, informed pt about it transferred her over to billing. Told her if they were to get everything worked out she could be transferred back over to me to continue scheduling.

## 2021-11-15 NOTE — Telephone Encounter (Signed)
Received approval from [place], [place] # [id number] (11/11/2021-05/01/2022).

## 2021-11-24 ENCOUNTER — Encounter: Payer: Medicare HMO | Admitting: Internal Medicine

## 2021-12-06 ENCOUNTER — Telehealth: Payer: Self-pay | Admitting: Neurology

## 2021-12-06 NOTE — Telephone Encounter (Signed)
Pt is calling and would like to schedule a Botox Shot Appointment.

## 2021-12-08 ENCOUNTER — Encounter (INDEPENDENT_AMBULATORY_CARE_PROVIDER_SITE_OTHER): Payer: Self-pay

## 2021-12-08 NOTE — Telephone Encounter (Signed)
Called pt, lm to get appt scheduled for Botox Injection.

## 2021-12-13 ENCOUNTER — Telehealth: Payer: Self-pay | Admitting: Neurology

## 2021-12-13 MED ORDER — ONABOTULINUMTOXINA 200 UNITS IJ SOLR: 155.0000 [IU] | INTRAMUSCULAR | 2 refills | Status: DC

## 2021-12-13 NOTE — Telephone Encounter (Signed)
Scheduled pt for botox appt with Sarah on 12/22/21 at 11:15.

## 2021-12-13 NOTE — Telephone Encounter (Signed)
Received approval from [place], [place] # [id number] (11/11/2021-05/01/2022). Please call the patient to schedule botox with Judson Roch

## 2021-12-13 NOTE — Telephone Encounter (Signed)
Pt is calling to schedule Botox appointment. Pt is requesting a call-back.

## 2021-12-13 NOTE — Addendum Note (Signed)
Addended by: Lester [place] A on: 12/13/2021 03:44 PM   Modules accepted: Orders

## 2021-12-13 NOTE — Telephone Encounter (Addendum)
I called [facility] [phone]). I provided VO for botox 155 units q 90 days. They will call patient to obtain consent for botox shipment to our office and then will call us back when the consent is obtained to set up delivery.

## 2021-12-14 ENCOUNTER — Telehealth: Payer: Self-pay | Admitting: Cardiology

## 2021-12-14 NOTE — Telephone Encounter (Signed)
   Patient Name: Monique Gomez  DOB: 10-Dec-1955 MRN: [id number]  Primary Cardiologist: Rozann Lesches, MD  Chart reviewed as part of pre-operative protocol coverage.   Simple dental extractions (i.e. 1-2 teeth) are considered low risk procedures per guidelines and generally do not require any specific cardiac clearance. It is also generally accepted that for simple extractions and dental cleanings, there is no need to interrupt blood thinner therapy.  SBE prophylaxis is not required for the patient from a cardiac standpoint.  I will route this recommendation to the requesting party via Epic fax function and remove from pre-op pool.  Please call with questions.  Monique Sciara, NP 12/14/2021, 5:05 PM

## 2021-12-14 NOTE — Telephone Encounter (Signed)
   Pre-operative Risk Assessment    Patient Name: Monique Gomez  DOB: 31-Jul-1955 MRN: [id number]      Request for Surgical Clearance    Procedure:  Dental Extraction - Amount of Teeth to be Pulled:  1  Date of Surgery:  Clearance TBD                                 Surgeon:  Perlie Gold  Surgeon's Group or Practice Name:  [facility] Phone number:  [phone] Fax number:  [phone]   Type of Clearance Requested:   - Pharmacy:  Hold Apixaban (Eliquis) ?   Type of Anesthesia:  Local    Additional requests/questions:    [name]   12/14/2021, 3:23 PM

## 2021-12-15 NOTE — Telephone Encounter (Signed)
Routed below recommendations to [facility].

## 2021-12-15 NOTE — Telephone Encounter (Signed)
   Patient Name: Monique Gomez  DOB: June 04, 1955 MRN: [id number]  Primary Cardiologist: Rozann Lesches, MD  Chart reviewed as part of pre-operative protocol coverage.   To confirm, a single dental extraction is considered a low-risk procedure from a cardiac standpoint. The below recommendations are per protocol/guidelines and apply to a single dental extraction as stated on the surgical clearance request.   Pre-op coverage team, please notify requesting office of this recommendation. I will remove from pre-op coverage pool.    Please call with any questions.   Lenna Sciara, NP 12/15/2021, 11:32 AM

## 2021-12-15 NOTE — Telephone Encounter (Signed)
Office was calling back to make sure that our office understand its a surgical procedure and not a simple procedure. Wanted to make sure it will still be okay. Please advise

## 2021-12-20 NOTE — Telephone Encounter (Signed)
I called [facility] [phone]). I spoke with Tee. The patient has not completed her consent and has not answered their calls. The patient has a $10.35 copay. If the patient can provide consent by 2:30pm today, they should be able to ship the botox for a delivery on 8/22.  I called patient. No answer, left a message asking her to call us back. I have already sent the patient a mychart message which she has not read.  If the patient calls back, please advise her to immediately call [facility] to give consent for them to ship her botox to [place]. If this is not completed prior to her appointment on 12/22/21 her appointment may be cancelled or delayed. Their phone number is [phone].

## 2021-12-22 ENCOUNTER — Ambulatory Visit: Payer: Medicare HMO | Admitting: Neurology

## 2021-12-22 ENCOUNTER — Encounter: Payer: Self-pay | Admitting: Neurology

## 2021-12-22 VITALS — BP 148/89 | HR 98 | Ht 62.0 in | Wt 277.0 lb

## 2021-12-22 DIAGNOSIS — G43709 Chronic migraine without aura, not intractable, without status migrainosus: Secondary | ICD-10-CM

## 2021-12-22 MED ORDER — ONABOTULINUMTOXINA 200 UNITS IJ SOLR
200.0000 [IU] | Freq: Once | INTRAMUSCULAR | Status: AC
  Administered 2021-12-22: 155 [IU] via INTRAMUSCULAR

## 2021-12-22 NOTE — Progress Notes (Signed)
Botox 200 units x 1 vial [id number] [id number] Exp-06/2024 S/P

## 2021-12-22 NOTE — Progress Notes (Signed)
     BOTOX PROCEDURE NOTE FOR MIGRAINE HEADACHE   HISTORY: Monique Gomez is here today for her first Botox injection.  Experiences daily headache.  Remains on Emgality, Topamax for headache preventative.  Has tried and failed multiple other medications.  She has signed her consent to proceed.  Description of procedure:  The patient was placed in a sitting position. The standard protocol was used for Botox as follows, with 5 units of Botox injected at each site:   -Procerus muscle, midline injection  -Corrugator muscle, bilateral injection  -Frontalis muscle, bilateral injection, with 2 sites each side, medial injection was performed in the upper one third of the frontalis muscle, in the region vertical from the medial inferior edge of the superior orbital rim. The lateral injection was again in the upper one third of the forehead vertically above the lateral limbus of the cornea, 1.5 cm lateral to the medial injection site.  -Temporalis muscle injection, 4 sites, bilaterally. The first injection was 3 cm above the tragus of the ear, second injection site was 1.5 cm to 3 cm up from the first injection site in line with the tragus of the ear. The third injection site was 1.5-3 cm forward between the first 2 injection sites. The fourth injection site was 1.5 cm posterior to the second injection site.  -Occipitalis muscle injection, 3 sites, bilaterally. The first injection was done one half way between the occipital protuberance and the tip of the mastoid process behind the ear. The second injection site was done lateral and superior to the first, 1 fingerbreadth from the first injection. The third injection site was 1 fingerbreadth superiorly and medially from the first injection site.  -Cervical paraspinal muscle injection, 2 sites, bilateral, the first injection site was 1 cm from the midline of the cervical spine, 3 cm inferior to the lower border of the occipital protuberance. The second  injection site was 1.5 cm superiorly and laterally to the first injection site.  -Trapezius muscle injection was performed at 3 sites, bilaterally. The first injection site was in the upper trapezius muscle halfway between the inflection point of the neck, and the acromion. The second injection site was one half way between the acromion and the first injection site. The third injection was done between the first injection site and the inflection point of the neck.  A 200 unit bottle of Botox was used, 155 units were injected, the rest of the Botox was wasted. The patient tolerated the procedure well, there were no complications of the above procedure.  Botox NDC 0626-9485-46 Lot number [id number] Expiration date 06/2024 SP

## 2021-12-22 NOTE — Telephone Encounter (Signed)
I will froward this to the pre op provider to please see notes from [facility]

## 2021-12-22 NOTE — Telephone Encounter (Signed)
Caller stated the clearance letter they received did not specify "surgical clearance" and she will need the approval for this procedure to state "surgical clearance".  Fax# [phone]

## 2021-12-23 NOTE — Telephone Encounter (Signed)
   Patient Name: Monique Gomez  DOB: Jun 21, 1955 MRN: [id number]  Primary Cardiologist: Rozann Lesches, MD  Chart reviewed as part of pre-operative protocol coverage. Pre-op clearance already addressed by colleagues in earlier phone notes. To summarize recommendations:  -Simple dental extractions (i.e. 1-2 teeth) are considered low risk procedures per guidelines and generally do not require any specific cardiac clearance.  Per office protocol, she may hold Eliquis 1 day prior to procedure.  SBE prophylaxis is not required for the patient from a cardiac standpoint.   I will route this recommendation to the requesting party via Epic fax function and remove from pre-op pool.  Elgie Collard, PA-C 12/23/2021, 4:37 PM

## 2021-12-23 NOTE — Telephone Encounter (Signed)
Crcl is 75 ml/min. She may hold Eliquis for 1 day prior to procedure.

## 2021-12-27 ENCOUNTER — Telehealth: Payer: Self-pay | Admitting: Cardiology

## 2021-12-27 NOTE — Telephone Encounter (Signed)
Notes faxed to surgeon. This phone note will be removed from the preop pool. Richardson Dopp, PA-C  12/27/2021 10:40 PM

## 2021-12-27 NOTE — Telephone Encounter (Signed)
   Pre-operative Risk Assessment    Patient Name: Monique Gomez  DOB: 02-21-1956 MRN: [id number]     Request for Surgical Clearance    Procedure:  Dental Extraction - Amount of Teeth to be Pulled:  1  Date of Surgery:  Clearance TBD                                 Surgeon:  [facility] Sur[facility]'s Group or Practice Name: [facility], [facility] Phone number:  [phone] Fax number:  [phone]   Type of Clearance Requested:   - Pharmacy:  Hold Apixaban (Eliquis) Medical clearance as well for BMI >40 and Sleep Apnea   Type of Anesthesia:  Not Indicated   Additional requests/questions:    Signed, Hipolito Bayley   12/27/2021, 1:53 PM

## 2021-12-27 NOTE — Telephone Encounter (Signed)
   Patient Name:  Monique Gomez  DOB:  09-29-1955  MRN:  [id number]   Primary Cardiologist: Rozann Lesches, MD  Chart reviewed as part of pre-operative protocol coverage.   Simple dental extractions are considered low risk procedures per ACC/AHA guidelines and generally do not require any specific cardiac clearance. It is also generally accepted that for simple extractions and dental cleanings, there is no need to interrupt blood thinner therapy.  SBE prophylaxis is not required for the patient from a cardiac standpoint. Pt may proceed with simple dental extraction.  Do NOT hold anticoagulation (Eliquis).  Please call with questions.  Richardson Dopp, PA-C 12/27/2021, 10:33 PM

## 2021-12-31 ENCOUNTER — Ambulatory Visit: Payer: 59 | Admitting: Cardiology

## 2022-01-02 ENCOUNTER — Other Ambulatory Visit: Payer: Self-pay | Admitting: Internal Medicine

## 2022-01-07 ENCOUNTER — Other Ambulatory Visit (HOSPITAL_COMMUNITY): Payer: Self-pay | Admitting: Internal Medicine

## 2022-01-12 ENCOUNTER — Encounter: Payer: Medicare HMO | Admitting: Internal Medicine

## 2022-01-12 ENCOUNTER — Encounter: Payer: Self-pay | Admitting: Internal Medicine

## 2022-01-13 ENCOUNTER — Ambulatory Visit: Payer: 59 | Admitting: Neurology

## 2022-01-18 ENCOUNTER — Telehealth: Payer: BC Managed Care – PPO | Admitting: Neurology

## 2022-01-18 ENCOUNTER — Encounter: Payer: Self-pay | Admitting: Internal Medicine

## 2022-01-18 ENCOUNTER — Ambulatory Visit (INDEPENDENT_AMBULATORY_CARE_PROVIDER_SITE_OTHER): Payer: Medicare HMO | Admitting: Internal Medicine

## 2022-01-18 VITALS — BP 132/84 | HR 91 | Resp 18 | Ht 62.0 in | Wt 275.4 lb

## 2022-01-18 DIAGNOSIS — I10 Essential (primary) hypertension: Secondary | ICD-10-CM

## 2022-01-18 DIAGNOSIS — E1169 Type 2 diabetes mellitus with other specified complication: Secondary | ICD-10-CM

## 2022-01-18 DIAGNOSIS — Z23 Encounter for immunization: Secondary | ICD-10-CM | POA: Diagnosis not present

## 2022-01-18 DIAGNOSIS — Z0000 Encounter for general adult medical examination without abnormal findings: Secondary | ICD-10-CM | POA: Diagnosis not present

## 2022-01-18 NOTE — Progress Notes (Signed)
Subjective:    Monique Gomez is a 66 y.o. female who presents for a Welcome to Medicare exam.   Review of Systems  Ms. Cirigliano , Thank you for taking time to come for your Medicare Wellness Visit. I appreciate your ongoing commitment to your health goals. Please review the following plan we discussed and let me know if I can assist you in the future.   These are the goals we discussed:  Goals      5 times per week     Patient Stated     Would like to find a part time job        This is a list of the screening recommended for you and due dates:  Health Maintenance  Topic Date Due   Zoster (Shingles) Vaccine (1 of 2) Never done   COVID-19 Vaccine (3 - Moderna risk series) 08/20/2019   Yearly kidney health urinalysis for diabetes  12/17/2020   DEXA scan (bone density measurement)  Never done   Flu Shot  11/30/2021   Complete foot exam   05/13/2022   Hemoglobin A1C  05/13/2022   Pap Smear  06/17/2022   Eye exam for diabetics  07/07/2022   Yearly kidney function blood test for diabetes  11/03/2022   Mammogram  03/09/2023   Colon Cancer Screening  02/01/2025   Tetanus Vaccine  08/20/2029   Pneumonia Vaccine  Completed   Hepatitis C Screening: USPSTF Recommendation to screen - Ages 18-79 yo.  Completed   HIV Screening  Completed   HPV Vaccine  Aged Out     Cardiac Risk Factors include: advanced age (>30mn, >>40women);diabetes mellitus;hypertension;obesity (BMI >30kg/m2);sedentary lifestyle      Objective:    Today's Vitals   01/18/22 1356 01/18/22 1357  BP: 132/84   Pulse: 91   Resp: 18   SpO2: 96%   Weight: 275 lb 6.4 oz (124.9 kg)   Height: '5\' 2"'  (1.575 m)   PainSc: 0-No pain 0-No pain  Body mass index is 50.37 kg/m.  Medications Outpatient Encounter Medications as of 01/18/2022  Medication Sig   acetaminophen (TYLENOL) 500 MG tablet Take 1 tablet (500 mg total) by mouth every 6 (six) hours as needed.   atorvastatin (LIPITOR) 80 MG tablet Take 1  tablet by mouth once daily   blood glucose meter kit and supplies Dispense based on patient and insurance preference. Use up to four times daily as directed. (FOR ICD-10 E10.9, E11.9).   botulinum toxin Type A (BOTOX) 200 units injection Inject 155 Units into the muscle every 3 (three) months.   ELIQUIS 5 MG TABS tablet Take 1 tablet by mouth twice daily   fluticasone (FLONASE) 50 MCG/ACT nasal spray Place 2 sprays into both nostrils daily.   Galcanezumab-gnlm (EMGALITY) 120 MG/ML SOAJ INJECT 120 MG  SUBCUTANEOUSLY ONCE EVERY 30 DAYS   ipratropium (ATROVENT) 0.03 % nasal spray Place 2 sprays into both nostrils every 12 (twelve) hours.   Iron-FA-B Cmp-C-Biot-Probiotic (FUSION PLUS) CAPS Take 2 capsules by mouth once daily   JARDIANCE 10 MG TABS tablet TAKE 1 TABLET BY MOUTH ONCE DAILY BEFORE BREAKFAST   metFORMIN (GLUCOPHAGE) 500 MG tablet TAKE 1 TABLET BY MOUTH TWICE DAILY WITH A MEAL   metoCLOPramide (REGLAN) 10 MG tablet Take 1 tablet (10 mg total) by mouth every 8 (eight) hours as needed for up to 7 days for nausea (Headache).   metoprolol succinate (TOPROL-XL) 50 MG 24 hr tablet TAKE 1 TABLET BY MOUTH  IN  THE MORNING AND AT BEDTIME. TAKE WITH OR IMMEDIATELY FOLLOWING A MEAL   pantoprazole (PROTONIX) 40 MG tablet Take 1 tablet by mouth once daily   sacubitril-valsartan (ENTRESTO) 97-103 MG Take 1 tablet by mouth 2 (two) times daily. NEEDS FOLLOW UP APPOINTMENT FOR ANYMORE REFILLS   topiramate (TOPAMAX) 50 MG tablet Take 2 tablets (100 mg total) by mouth at bedtime.   Ubrogepant (UBRELVY) 50 MG TABS TAKE ONE TABLET BY MOUTH AT THE ONSET OF A MIGRAINE. CAN REPEAT IN 2 HOURS IF NEEDED   meclizine (ANTIVERT) 25 MG tablet Take 1 tablet (25 mg total) by mouth 3 (three) times daily as needed for dizziness. (Patient not taking: Reported on 11/10/2021)   No facility-administered encounter medications on file as of 01/18/2022.     History: Past Medical History:  Diagnosis Date   Abdominal fibromatosis     Allergic rhinitis    Anemia    Asthma    Essential hypertension    GERD (gastroesophageal reflux disease)    History of cardiac catheterization    No significant CAD May 2015   History of migraine headaches    Hyperlipidemia    Iron deficiency anemia 02/05/2021   Myocarditis ([place])    a. diagnosed in 05/2020 with cath showing normal cors   Obesity    PAF (paroxysmal atrial fibrillation) ([place]) 08/2013   Sleep apnea    Type 2 diabetes mellitus [facility])    Past Surgical History:  Procedure Laterality Date   CATARACT EXTRACTION     CATARACT EXTRACTION W/PHACO Right 04/05/2019   Procedure: CATARACT EXTRACTION PHACO AND INTRAOCULAR LENS PLACEMENT RIGHT EYE (CDE: 3.19);  Surgeon: Baruch Goldmann, MD;  Location: [facility];  Service: Ophthalmology;  Laterality: Right;   COLONOSCOPY N/A 02/02/2015   SLF: 1. one colon polyp removed-no source for anemia identified. 2. moderate diverticulosis noted in the sigmoid colon and descending colon 3. the left colon is redundant 4. Rectal bleeding due ot small internal hemorroids 5. Moderate sized external hemorrhoids.   ESOPHAGOGASTRODUODENOSCOPY N/A 02/02/2015   SLF: 1. 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 large hiatal hernia 3. mild non-erosive gastritis 4. No source for anemia identified.    LEFT HEART CATH AND CORONARY ANGIOGRAPHY N/A 05/14/2020   Procedure: LEFT HEART CATH AND CORONARY ANGIOGRAPHY;  Surgeon: Belva Crome, MD;  Location: [place] CV LAB;  Service: Cardiovascular;  Laterality: N/A;   LEFT HEART CATHETERIZATION WITH CORONARY ANGIOGRAM N/A 09/03/2013   Procedure: LEFT HEART CATHETERIZATION WITH CORONARY ANGIOGRAM;  Surgeon: Jettie Booze, MD;  Location: [facility] CATH LAB;  Service: Cardiovascular;  Laterality: N/A;   Right carpal tunnel release      Family History  Problem Relation Age of Onset   Diabetes Mother    Hypertension Mother    Heart disease Mother    High Cholesterol Mother    Alzheimer's disease Father     Hypertension Sister    Hypertension Brother    Diabetes Brother    Arthritis Sister    Hypertension Sister    High Cholesterol Sister    Diabetes Son    Developmental delay Son    Colon cancer Neg Hx    Social History   Occupational History   Occupation: Education officer, museum    Employer: [employer]  Tobacco Use   Smoking status: Former    Packs/day: 0.30    Types: Cigarettes    Quit date: 04/01/2012    Years since quitting: 9.8   Smokeless tobacco: Never  Vaping  Use   Vaping Use: Never used  Substance and Sexual Activity   Alcohol use: Not Currently    Alcohol/week: 0.0 standard drinks of alcohol    Comment: very occasional   Drug use: No   Sexual activity: Yes    Tobacco Counseling Counseling given: Not Answered   Immunizations and Health Maintenance Immunization History  Administered Date(s) Administered   Influenza,inj,Quad PF,6+ Mos 01/14/2019, 05/19/2020   Moderna Sars-Covid-2 Vaccination 06/24/2019, 07/23/2019   PNEUMOCOCCAL CONJUGATE-20 11/10/2021   PPD Test 08/25/2010   Pneumococcal Polysaccharide-23 12/06/2013   Td 12/23/2009   Tdap 08/21/2019   Health Maintenance Due  Topic Date Due   Zoster Vaccines- Shingrix (1 of 2) Never done   COVID-19 Vaccine (3 - Moderna risk series) 08/20/2019   Diabetic kidney evaluation - Urine ACR  12/17/2020   DEXA SCAN  Never done   INFLUENZA VACCINE  11/30/2021    Activities of Daily Living    01/18/2022    2:02 PM  In your present state of health, do you have any difficulty performing the following activities:  Hearing? 0  Vision? 0  Difficulty concentrating or making decisions? 0  Walking or climbing stairs? 0  Dressing or bathing? 0  Doing errands, shopping? 0  Preparing Food and eating ? N  Using the Toilet? N  In the past six months, have you accidently leaked urine? N  Do you have problems with loss of bowel control? N  Managing your Medications? N  Managing your Finances? N  Housekeeping or managing  your Housekeeping? N    Physical Exam   Vitals reviewed.  Constitutional:      General: She is not in acute distress.    Appearance: She is obese. She is not diaphoretic.  HENT:     Head: Normocephalic and atraumatic.    Mouth/Throat:     Mouth: Mucous membranes are moist.  Eyes:     General: No scleral icterus.    Extraocular Movements: Extraocular movements intact.  Cardiovascular:     Rate and Rhythm: Normal rate and regular rhythm.     Pulses: Normal pulses.     Heart sounds: Normal heart sounds. No murmur heard. Pulmonary:     Breath sounds: Normal breath sounds. No wheezing or rales.  Musculoskeletal:     Cervical back: Neck supple. No tenderness.     Right lower leg: No edema.     Left lower leg: No edema.  Skin:    General: Skin is warm.     Findings: Erythema (Chronic, over right side of face) present. No rash.  Neurological:     General: No focal deficit present.     Mental Status: She is alert and oriented to person, place, and time.     Cranial Nerves: No cranial nerve deficit.     Sensory: No sensory deficit.     Motor: No weakness.  Psychiatric:        Mood and Affect: Mood normal.        Behavior: Behavior normal.       Advanced Directives: Does Patient Have a Medical Advance Directive?: No Would patient like information on creating a medical advance directive?: No - Patient declined    Assessment:    This is a routine wellness examination for this patient .  Vision/Hearing screen No results found.  Dietary issues and exercise activities discussed:  Current Exercise Habits: Home exercise routine, Type of exercise: walking, Time (Minutes): 30, Frequency (Times/Week): 3, Weekly Exercise (Minutes/Week): 90,  Intensity: Mild, Exercise limited by: None identified   Goals      5 times per week     Patient Stated     Would like to find a part time job      Depression Screen    01/18/2022    2:01 PM 11/10/2021    8:06 AM 05/13/2021    8:04 AM  01/11/2021    8:38 AM  PHQ 2/9 Scores  PHQ - 2 Score 0 0 0 0     Fall Risk    01/18/2022    2:01 PM  [place] in the past year? 0  Number falls in past yr: 0  Injury with Fall? 0  Risk for fall due to : No Fall Risks  Follow up Falls evaluation completed    Cognitive Function:    01/18/2022    2:02 PM  MMSE - Mini Mental State Exam  Not completed: Unable to complete        01/18/2022    2:02 PM  6CIT Screen  What Year? 0 points  What month? 0 points  What time? 0 points  Count back from 20 0 points  Months in reverse 2 points  Repeat phrase 0 points  Total Score 2 points    Patient Care Team: Lindell Spar, MD as PCP - General (Internal Medicine) Satira Sark, MD as PCP - Cardiology (Cardiology) Danie Binder, MD (Inactive) as Consulting Physician (Gastroenterology) Derek Jack, MD as Consulting Physician (Hematology) Donetta Potts, RN as Oncology Nurse Navigator (Oncology)     Plan:    Encounter for Medicare annual wellness exam Screening questionnaire reviewed. Advance directive information declined. Advised to get Shingrix vaccine at local pharmacy.  HTN (hypertension) BP Readings from Last 1 Encounters:  01/18/22 132/84   Well-controlled with Entresto and Metoprolol Counseled for compliance with the medications Advised DASH diet and moderate exercise/walking, at least 150 mins/week  EKG: Sinus rhythm. RBBB. Similar compared to prior.  DM (diabetes mellitus) ([place]) Lab Results  Component Value Date   HGBA1C 6.6 11/10/2021   HGBA1C 6.6 11/10/2021   Well-controlled On Metformin and Jardiance On statin Has an Ophthalmologist    I have personally reviewed and noted the following in the patient's chart:   Medical and social history Use of alcohol, tobacco or illicit drugs  Current medications and supplements Functional ability and status Nutritional status Physical activity Advanced directives List of other  physicians Hospitalizations, surgeries, and ER visits in previous 12 months Vitals Screenings to include cognitive, depression, and falls Referrals and appointments  In addition, I have reviewed and discussed with patient certain preventive protocols, quality metrics, and best practice recommendations. A written personalized care plan for preventive services as well as general preventive health recommendations were provided to patient.

## 2022-01-18 NOTE — Assessment & Plan Note (Addendum)
Screening questionnaire reviewed. Advance directive information declined. Advised to get Shingrix vaccine at local pharmacy.

## 2022-01-18 NOTE — Assessment & Plan Note (Addendum)
Lab Results  Component Value Date   HGBA1C 6.6 11/10/2021   HGBA1C 6.6 11/10/2021   Well-controlled On Metformin and Jardiance On statin Has an Ophthalmologist

## 2022-01-18 NOTE — Assessment & Plan Note (Addendum)
BP Readings from Last 1 Encounters:  01/18/22 132/84   Well-controlled with Entresto and Metoprolol Counseled for compliance with the medications Advised DASH diet and moderate exercise/walking, at least 150 mins/week  EKG: Sinus rhythm. RBBB. Similar compared to prior.

## 2022-01-18 NOTE — Patient Instructions (Signed)
Monique Gomez , Thank you for taking time to come for your Medicare Wellness Visit. I appreciate your ongoing commitment to your health goals. Please review the following plan we discussed and let me know if I can assist you in the future.   Screening recommendations/referrals: Colonoscopy: completed Mammogram: completed Bone Density: scheduled Recommended yearly ophthalmology/optometry visit for glaucoma screening and checkup Recommended yearly dental visit for hygiene and checkup  Vaccinations: Influenza vaccine: completed Pneumococcal vaccine: completed Tdap vaccine: completed Shingles vaccine: due now     Advanced directives: declined information  Conditions/risks identified: hypertension, falls  Next appointment: 1 year   Preventive Care 65 Years and Older, Female Preventive care refers to lifestyle choices and visits with your health care provider that can promote health and wellness. What does preventive care include? A yearly physical exam. This is also called an annual well check. Dental exams once or twice a year. Routine eye exams. Ask your health care provider how often you should have your eyes checked. Personal lifestyle choices, including: Daily care of your teeth and gums. Regular physical activity. Eating a healthy diet. Avoiding tobacco and drug use. Limiting alcohol use. Practicing safe sex. Taking low-dose aspirin every day. Taking vitamin and mineral supplements as recommended by your health care provider. What happens during an annual well check? The services and screenings done by your health care provider during your annual well check will depend on your age, overall health, lifestyle risk factors, and family history of disease. Counseling  Your health care provider may ask you questions about your: Alcohol use. Tobacco use. Drug use. Emotional well-being. Home and relationship well-being. Sexual activity. Eating habits. History of falls. Memory  and ability to understand (cognition). Work and work Statistician. Reproductive health. Screening  You may have the following tests or measurements: Height, weight, and BMI. Blood pressure. Lipid and cholesterol levels. These may be checked every 5 years, or more frequently if you are over 61 years old. Skin check. Lung cancer screening. You may have this screening every year starting at age 12 if you have a 30-pack-year history of smoking and currently smoke or have quit within the past 15 years. Fecal occult blood test (FOBT) of the stool. You may have this test every year starting at age 32. Flexible sigmoidoscopy or colonoscopy. You may have a sigmoidoscopy every 5 years or a colonoscopy every 10 years starting at age 78. Hepatitis C blood test. Hepatitis B blood test. Sexually transmitted disease (STD) testing. Diabetes screening. This is done by checking your blood sugar (glucose) after you have not eaten for a while (fasting). You may have this done every 1-3 years. Bone density scan. This is done to screen for osteoporosis. You may have this done starting at age 7. Mammogram. This may be done every 1-2 years. Talk to your health care provider about how often you should have regular mammograms. Talk with your health care provider about your test results, treatment options, and if necessary, the need for more tests. Vaccines  Your health care provider may recommend certain vaccines, such as: Influenza vaccine. This is recommended every year. Tetanus, diphtheria, and acellular pertussis (Tdap, Td) vaccine. You may need a Td booster every 10 years. Zoster vaccine. You may need this after age 40. Pneumococcal 13-valent conjugate (PCV13) vaccine. One dose is recommended after age 11. Pneumococcal polysaccharide (PPSV23) vaccine. One dose is recommended after age 69. Talk to your health care provider about which screenings and vaccines you need and how often you need  them. This  information is not intended to replace advice given to you by your health care provider. Make sure you discuss any questions you have with your health care provider. Document Released: 05/15/2015 Document Revised: 01/06/2016 Document Reviewed: 02/17/2015 Elsevier Interactive Patient Education  2017 [place] Prevention in the Home Falls can cause injuries. They can happen to people of all ages. There are many things you can do to make your home safe and to help prevent falls. What can I do on the outside of my home? Regularly fix the edges of walkways and driveways and fix any cracks. Remove anything that might make you trip as you walk through a door, such as a raised step or threshold. Trim any bushes or trees on the path to your home. Use bright outdoor lighting. Clear any walking paths of anything that might make someone trip, such as rocks or tools. Regularly check to see if handrails are loose or broken. Make sure that both sides of any steps have handrails. Any raised decks and porches should have guardrails on the edges. Have any leaves, snow, or ice cleared regularly. Use sand or salt on walking paths during winter. Clean up any spills in your garage right away. This includes oil or grease spills. What can I do in the bathroom? Use night lights. Install grab bars by the toilet and in the tub and shower. Do not use towel bars as grab bars. Use non-skid mats or decals in the tub or shower. If you need to sit down in the shower, use a plastic, non-slip stool. Keep the floor dry. Clean up any water that spills on the floor as soon as it happens. Remove soap buildup in the tub or shower regularly. Attach bath mats securely with double-sided non-slip rug tape. Do not have throw rugs and other things on the floor that can make you trip. What can I do in the bedroom? Use night lights. Make sure that you have a light by your bed that is easy to reach. Do not use any sheets or  blankets that are too big for your bed. They should not hang down onto the floor. Have a firm chair that has side arms. You can use this for support while you get dressed. Do not have throw rugs and other things on the floor that can make you trip. What can I do in the kitchen? Clean up any spills right away. Avoid walking on wet floors. Keep items that you use a lot in easy-to-reach places. If you need to reach something above you, use a strong step stool that has a grab bar. Keep electrical cords out of the way. Do not use floor polish or wax that makes floors slippery. If you must use wax, use non-skid floor wax. Do not have throw rugs and other things on the floor that can make you trip. What can I do with my stairs? Do not leave any items on the stairs. Make sure that there are handrails on both sides of the stairs and use them. Fix handrails that are broken or loose. Make sure that handrails are as long as the stairways. Check any carpeting to make sure that it is firmly attached to the stairs. Fix any carpet that is loose or worn. Avoid having throw rugs at the top or bottom of the stairs. If you do have throw rugs, attach them to the floor with carpet tape. Make sure that you have a light switch at  the top of the stairs and the bottom of the stairs. If you do not have them, ask someone to add them for you. What else can I do to help prevent falls? Wear shoes that: Do not have high heels. Have rubber bottoms. Are comfortable and fit you well. Are closed at the toe. Do not wear sandals. If you use a stepladder: Make sure that it is fully opened. Do not climb a closed stepladder. Make sure that both sides of the stepladder are locked into place. Ask someone to hold it for you, if possible. Clearly mark and make sure that you can see: Any grab bars or handrails. First and last steps. Where the edge of each step is. Use tools that help you move around (mobility aids) if they are  needed. These include: Canes. Walkers. Scooters. Crutches. Turn on the lights when you go into a dark area. Replace any light bulbs as soon as they burn out. Set up your furniture so you have a clear path. Avoid moving your furniture around. If any of your floors are uneven, fix them. If there are any pets around you, be aware of where they are. Review your medicines with your doctor. Some medicines can make you feel dizzy. This can increase your chance of falling. Ask your doctor what other things that you can do to help prevent falls. This information is not intended to replace advice given to you by your health care provider. Make sure you discuss any questions you have with your health care provider. Document Released: 02/12/2009 Document Revised: 09/24/2015 Document Reviewed: 05/23/2014 Elsevier Interactive Patient Education  2017 [employer].

## 2022-01-19 ENCOUNTER — Other Ambulatory Visit: Payer: Self-pay | Admitting: Internal Medicine

## 2022-01-19 ENCOUNTER — Telehealth: Payer: Self-pay | Admitting: Internal Medicine

## 2022-01-19 DIAGNOSIS — M545 Low back pain, unspecified: Secondary | ICD-10-CM

## 2022-01-19 NOTE — Progress Notes (Unsigned)
Cardiology Office Note    Date:  01/20/2022   ID:  Monique Gomez, DOB [birth year], MRN [id number]  PCP:  Lindell Spar, MD  Cardiologist: Rozann Lesches, MD    Chief Complaint  Patient presents with   Follow-up    6 month visit    History of Present Illness:    Monique Gomez is a 66 y.o. female with past medical history of paroxysmal atrial fibrillation, acute viral myocarditis (occurring in 05/2020 with cath showing normal cors), HTN, HLD, OSA (on CPAP), Type II DM and GERD who presents to the office today for 55-month follow-up.  She was examined by Dr. Domenic Polite in 06/2021 and reported NYHA class II dyspnea but denied any chest pain or palpitations. She was continued on her current cardiac medications including Toprol-XL, Entresto, Jardiance and Eliquis.  In talking with the patient today, she reports overall doing well from a cardiac perspective since her last office visit. She denies any recent chest pain and says her breathing has overall been stable. She does experience occasional palpitations but says they typically resolve within a few seconds. Does not consume caffeine.  She denies any specific orthopnea, PND or pitting edema. She is having back pain which is felt to be secondary to arthritis and is being followed by Orthopedics. She is planning to participate in water aerobics.  She has lost over 30 pounds within the past year with dietary changes.  Past Medical History:  Diagnosis Date   Abdominal fibromatosis    Allergic rhinitis    Anemia    Asthma    Essential hypertension    GERD (gastroesophageal reflux disease)    History of cardiac catheterization    No significant CAD May 2015   History of migraine headaches    Hyperlipidemia    Iron deficiency anemia 02/05/2021   Myocarditis ([place])    a. diagnosed in 05/2020 with cath showing normal cors   Obesity    PAF (paroxysmal atrial fibrillation) ([place]) 08/2013   Sleep apnea    Type 2 diabetes mellitus  [facility])     Past Surgical History:  Procedure Laterality Date   CATARACT EXTRACTION     CATARACT EXTRACTION W/PHACO Right 04/05/2019   Procedure: CATARACT EXTRACTION PHACO AND INTRAOCULAR LENS PLACEMENT RIGHT EYE (CDE: 3.19);  Surgeon: Baruch Goldmann, MD;  Location: [facility];  Service: Ophthalmology;  Laterality: Right;   COLONOSCOPY N/A 02/02/2015   SLF: 1. one colon polyp removed-no source for anemia identified. 2. moderate diverticulosis noted in the sigmoid colon and descending colon 3. the left colon is redundant 4. Rectal bleeding due ot small internal hemorroids 5. Moderate sized external hemorrhoids.   ESOPHAGOGASTRODUODENOSCOPY N/A 02/02/2015   SLF: 1. 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 large hiatal hernia 3. mild non-erosive gastritis 4. No source for anemia identified.    LEFT HEART CATH AND CORONARY ANGIOGRAPHY N/A 05/14/2020   Procedure: LEFT HEART CATH AND CORONARY ANGIOGRAPHY;  Surgeon: Belva Crome, MD;  Location: [place] CV [facility];  Service: Cardiovascular;  Laterality: N/A;   LEFT HEART CATHETERIZATION WITH CORONARY ANGIOGRAM N/A 09/03/2013   Procedure: LEFT HEART CATHETERIZATION WITH CORONARY ANGIOGRAM;  Surgeon: Jettie Booze, MD;  Location: [facility] CATH LAB;  Service: Cardiovascular;  Laterality: N/A;   Right carpal tunnel release      Current Medications: Outpatient Medications Prior to Visit  Medication Sig Dispense Refill   acetaminophen (TYLENOL) 500 MG tablet Take 1 tablet (500 mg total) by mouth every 6 (six)  hours as needed. 30 tablet 0   atorvastatin (LIPITOR) 80 MG tablet Take 1 tablet by mouth once daily 90 tablet 3   blood glucose meter kit and supplies Dispense based on patient and insurance preference. Use up to four times daily as directed. (FOR ICD-10 E10.9, E11.9). 1 each 0   botulinum toxin Type A (BOTOX) 200 units injection Inject 155 Units into the muscle every 3 (three) months. 1 each 2   cyclobenzaprine (FLEXERIL) 10 MG tablet Take 1  tablet (10 mg total) by mouth at bedtime. One tablet every night at bedtime as needed for spasm. 30 tablet 0   ELIQUIS 5 MG TABS tablet Take 1 tablet by mouth twice daily 180 tablet 0   fluticasone (FLONASE) 50 MCG/ACT nasal spray Place 2 sprays into both nostrils daily. 16 g 5   Galcanezumab-gnlm (EMGALITY) 120 MG/ML SOAJ INJECT 120 MG  SUBCUTANEOUSLY ONCE EVERY 30 DAYS 1 mL 11   ipratropium (ATROVENT) 0.03 % nasal spray Place 2 sprays into both nostrils every 12 (twelve) hours. 30 mL 3   Iron-FA-B Cmp-C-Biot-Probiotic (FUSION PLUS) CAPS Take 2 capsules by mouth once daily 60 capsule 11   JARDIANCE 10 MG TABS tablet TAKE 1 TABLET BY MOUTH ONCE DAILY BEFORE BREAKFAST 90 tablet 3   meclizine (ANTIVERT) 25 MG tablet Take 1 tablet (25 mg total) by mouth 3 (three) times daily as needed for dizziness. 30 tablet 0   metFORMIN (GLUCOPHAGE) 500 MG tablet TAKE 1 TABLET BY MOUTH TWICE DAILY WITH A MEAL 180 tablet 0   metoCLOPramide (REGLAN) 10 MG tablet Take 1 tablet (10 mg total) by mouth every 8 (eight) hours as needed for up to 7 days for nausea (Headache). 21 tablet 0   metoprolol succinate (TOPROL-XL) 50 MG 24 hr tablet TAKE 1 TABLET BY MOUTH  IN THE MORNING AND AT BEDTIME. TAKE WITH OR IMMEDIATELY FOLLOWING A MEAL 180 tablet 3   pantoprazole (PROTONIX) 40 MG tablet Take 1 tablet by mouth once daily 90 tablet 0   topiramate (TOPAMAX) 50 MG tablet Take 2 tablets (100 mg total) by mouth at bedtime. 180 tablet 3   Ubrogepant (UBRELVY) 50 MG TABS TAKE ONE TABLET BY MOUTH AT THE ONSET OF A MIGRAINE. CAN REPEAT IN 2 HOURS IF NEEDED 10 tablet 11   sacubitril-valsartan (ENTRESTO) 97-103 MG Take 1 tablet by mouth 2 (two) times daily. NEEDS FOLLOW UP APPOINTMENT FOR ANYMORE REFILLS 180 tablet 0   No facility-administered medications prior to visit.     Allergies:   Venlafaxine   Social History   Socioeconomic History   Marital status: Single    Spouse name: Not on file   Number of children: 2   Years of  education: Not on file   Highest education level: Not on file  Occupational History   Occupation: Child psychotherapist    Employer: [employer]  Tobacco Use   Smoking status: Former    Packs/day: 0.30    Types: Cigarettes    Quit date: 04/01/2012    Years since quitting: 9.8   Smokeless tobacco: Never  Vaping Use   Vaping Use: Never used  Substance and Sexual Activity   Alcohol use: Not Currently    Alcohol/week: 0.0 standard drinks of alcohol    Comment: very occasional   Drug use: No   Sexual activity: Yes  Other Topics Concern   Not on file  Social History Narrative   Not on file   Social Determinants of Health  Financial Resource Strain: Low Risk  (01/18/2022)   Overall Financial Resource Strain (CARDIA)    Difficulty of Paying Living Expenses: Not hard at all  Food Insecurity: No Food Insecurity (01/18/2022)   Hunger Vital Sign    Worried About Running Out of Food in the Last Year: Never true    Ran Out of Food in the Last Year: Never true  Transportation Needs: No Transportation Needs (01/18/2022)   PRAPARE - Hydrologist (Medical): No    Lack of Transportation (Non-Medical): No  Physical Activity: Insufficiently Active (01/18/2022)   Exercise Vital Sign    Days of Exercise per Week: 3 days    Minutes of Exercise per Session: 30 min  Stress: No Stress Concern Present (01/18/2022)   Reserve    Feeling of Stress : Not at all  Social Connections: Socially Isolated (01/18/2022)   Social Connection and Isolation Panel [NHANES]    Frequency of Communication with Friends and Family: More than three times a week    Frequency of Social Gatherings with Friends and Family: More than three times a week    Attends Religious Services: Never    Marine scientist or Organizations: No    Attends Music therapist: Never    Marital Status: Never married     Family  History:  The patient's family history includes Alzheimer's disease in her father; Arthritis in her sister; Developmental delay in her son; Diabetes in her brother, mother, and son; Heart disease in her mother; High Cholesterol in her mother and sister; Hypertension in her brother, mother, sister, and sister.   Review of Systems:    Please see the history of present illness.     All other systems reviewed and are otherwise negative except as noted above.   Physical Exam:    VS:  BP 116/78   Pulse 84   Wt 274 lb (124.3 kg)   SpO2 95%   BMI 50.12 kg/m    General: Pleasant female appearing in no acute distress. Head: Normocephalic, atraumatic. Neck: No carotid bruits. JVD not elevated.  Lungs: Respirations regular and unlabored, without wheezes or rales.  Heart: Regular rate and rhythm. No S3 or S4.  No murmur, no rubs, or gallops appreciated. Abdomen: Appears non-distended. No obvious abdominal masses. Msk:  Strength and tone appear normal for age. No obvious joint deformities or effusions. Extremities: No clubbing or cyanosis. No pitting edema.  Distal pedal pulses are 2+ bilaterally. Neuro: Alert and oriented X 3. Moves all extremities spontaneously. No focal deficits noted. Psych:  Responds to questions appropriately with a normal affect. Skin: No rashes or lesions noted  Wt Readings from Last 3 Encounters:  01/20/22 274 lb (124.3 kg)  01/20/22 273 lb 12.8 oz (124.2 kg)  01/18/22 275 lb 6.4 oz (124.9 kg)      Studies/Labs Reviewed:   EKG:  EKG is not ordered today. EKG from 01/18/2022 is reviewed and shows NSR, HR 97 with known RBBB.   Recent Labs: 05/13/2021: ALT 14; TSH 2.590 11/02/2021: BUN 20; Creatinine, Ser 0.94; Hemoglobin 13.1; Platelets 304; Potassium 3.6; Sodium 140   Lipid Panel    Component Value Date/Time   CHOL 174 05/13/2021 0848   TRIG 101 05/13/2021 0848   HDL 41 05/13/2021 0848   CHOLHDL 4.2 05/13/2021 0848   CHOLHDL 6.8 05/13/2020 1005   VLDL 31  05/13/2020 1005   LDLCALC 114 (H) 05/13/2021 0848  [place] 107 (H) 12/18/2019 1157    Additional studies/ records that were reviewed today include:   LHC: 05/2020 Widely patent/normal widely patent coronary arteries. Right dominant anatomy Regional wall motion abnormality noted on left ventriculography. EF 40 to 50%.   RECOMMENDATIONS:   Given context of clinical presentation, myopericarditis is now the leading diagnosis in the differential. This presentation is not atypical stress cardiomyopathy. Recommend MRI/cardiac.  cMRI: 12/2020 IMPRESSION: 1. Compared to prior cardiac MRI 05/15/20, T2 values have normalized and T1/ECV are significantly lower, consistent with resolution of myocarditis   2. Severe asymmetric LV hypertrophy measuring up to 68mm in basal septum (7mm in posterior wall), consistent with hypertrophic cardiomyopathy   3. Patchy late gadolinium enhancement primarily in septum, consistent with HCM. LGE accounts for 15% of total myocardial mass   4.  Small LV size with normal systolic function (EF 26%)   5.  Small RV size with normal systolic function (EF 20%)   6.  Dilated main pulmonary artery measuring 56mm  Assessment:    1. Chronic heart failure with preserved ejection fraction ([place])   2. History of myocarditis   3. Paroxysmal atrial fibrillation ([facility])   4. Essential hypertension      Plan:   In order of problems listed above:  1. HFimpEF - Her EF was previously 41% by cardiac MRI in 05/2020 but had normalized by repeat imaging. She reports her breathing has been stable and denies any specific orthopnea, PND or pitting edema. Continue current medical therapy with Entresto 97-103 mg twice daily, Jardiance 10 mg daily and Toprol-XL 50 mg twice daily. Creatinine was stable at 0.94 in 10/2021.  2. History of Myocarditis - This was diagnosed in 05/2020 and catheterization at that time showed normal coronary arteries as outlined above. She did have  resolution of her myocarditis by repeat cardiac MRI in 12/2020.  No indication for further testing at this time.  3. Paroxysmal Atrial Fibrillation - She reports occasional palpitations but says they are overall very brief and spontaneously resolve within a few seconds. Continue Toprol-XL 50 mg twice daily for rate control. - No reports of active bleeding. Labs in 10/2021 showed her hemoglobin was stable at 13.1 with platelets at 304 K. Continue Eliquis 5 mg twice daily for anticoagulation which is the appropriate dose at this time given her age, weight and renal function.  4. HTN - Her blood pressure is well-controlled at 116/78 during today's visit. I encouraged her to continue to follow this at home as she may require dose reduction of medical therapy with continued weight loss. For now, will continue Entresto 97-103 mg twice daily and Toprol-XL 50 mg twice daily.   Medication Adjustments/Labs and Tests Ordered: Current medicines are reviewed at length with the patient today.  Concerns regarding medicines are outlined above.  Medication changes, Labs and Tests ordered today are listed in the Patient Instructions below. Patient Instructions  Medication Instructions:  Your physician recommends that you continue on your current medications as directed. Please refer to the Current Medication list given to you today.   Labwork: None today   Testing/Procedures: None today  Follow-Up: 6 months  Any Other Special Instructions Will Be Listed Below (If Applicable).  If you need a refill on your cardiac medications before your next appointment, please call your pharmacy.    Signed, Erma Heritage, PA-C  01/20/2022 1:34 PM    [facility] [address] [place], [place] [address] Phone: [phone] Fax: [phone])  [phone]   

## 2022-01-19 NOTE — Telephone Encounter (Signed)
Patient states that she forgot to mention at visit on 9/19 that she needed a referral to Dr. Aline Brochure for back.  Wants a call back in regard.

## 2022-01-20 ENCOUNTER — Encounter: Payer: Self-pay | Admitting: Orthopaedic Surgery

## 2022-01-20 ENCOUNTER — Encounter: Payer: Self-pay | Admitting: Student

## 2022-01-20 ENCOUNTER — Ambulatory Visit: Payer: Medicare HMO | Admitting: Orthopaedic Surgery

## 2022-01-20 ENCOUNTER — Ambulatory Visit: Payer: Medicare HMO | Attending: Cardiology | Admitting: Student

## 2022-01-20 VITALS — BP 116/78 | HR 84 | Wt 274.0 lb

## 2022-01-20 VITALS — BP 133/91 | HR 94 | Ht 62.0 in | Wt 273.8 lb

## 2022-01-20 DIAGNOSIS — Z8679 Personal history of other diseases of the circulatory system: Secondary | ICD-10-CM

## 2022-01-20 DIAGNOSIS — I10 Essential (primary) hypertension: Secondary | ICD-10-CM | POA: Diagnosis not present

## 2022-01-20 DIAGNOSIS — M545 Low back pain, unspecified: Secondary | ICD-10-CM | POA: Diagnosis not present

## 2022-01-20 DIAGNOSIS — M79604 Pain in right leg: Secondary | ICD-10-CM

## 2022-01-20 DIAGNOSIS — I5032 Chronic diastolic (congestive) heart failure: Secondary | ICD-10-CM

## 2022-01-20 DIAGNOSIS — I480 Paroxysmal atrial fibrillation: Secondary | ICD-10-CM

## 2022-01-20 MED ORDER — CYCLOBENZAPRINE HCL 10 MG PO TABS: 10.0000 mg | ORAL_TABLET | Freq: Every day | ORAL | 0 refills | Status: DC

## 2022-01-20 MED ORDER — ENTRESTO 97-103 MG PO TABS: 1.0000 | ORAL_TABLET | Freq: Two times a day (BID) | ORAL | 3 refills | Status: DC

## 2022-01-20 NOTE — Addendum Note (Signed)
Addended by: Obie Dredge A on: 01/20/2022 03:13 PM   Modules accepted: Orders

## 2022-01-20 NOTE — Patient Instructions (Signed)
Medication Instructions:  Your physician recommends that you continue on your current medications as directed. Please refer to the Current Medication list given to you today.   Labwork: None today  Testing/Procedures: None today  Follow-Up: 6 months  Any Other Special Instructions Will Be Listed Below (If Applicable).  If you need a refill on your cardiac medications before your next appointment, please call your pharmacy.  

## 2022-01-20 NOTE — Progress Notes (Signed)
My back hurts.  She has lower back pain with right sided sciatica for over six to eight weeks.  She has no trauma.  She has no weakness, but has paresthesias to the right lower leg.    She is on Eliquis and cannot take NSAIDs.  She was seen at [facility] on 01-11-22.  I have reviewed the notes.  She has had lumbar X-rays done 01-12-22 showing: transitional anatomy and facet arthropathy most advanced at L4-L5 thru S1-S2.  I have independently reviewed and interpreted x-rays of this patient done at another site by another physician or qualified health professional.  She has scheduled a cruise to the [place] beginning November 20.  She used to walk a lot but now has difficulty over 100 to 200 feet.  Spine/Pelvis examination:  Inspection:  Overall, sacoiliac joint benign and hips nontender; without crepitus or defects.   Thoracic spine inspection: Alignment normal without kyphosis present   Lumbar spine inspection:  Alignment  with normal lumbar lordosis, without scoliosis apparent.   Thoracic spine palpation:  without tenderness of spinal processes   Lumbar spine palpation: with tenderness of lumbar area; with tightness of lumbar muscles    Range of Motion:   Lumbar flexion, forward flexion is limited with pain or tenderness    Lumbar extension is 5 with pain or tenderness   Left lateral bend is Normal  with pain or tenderness   Right lateral bend is Normal with pain or tenderness   Straight leg raising is Normal   Strength & tone: Normal   Stability overall normal stability    Encounter Diagnosis  Name Primary?   Lumbar pain with radiation down right leg Yes   I would like to get a MRI of her lumbar spine.  She cannot take NSAIDs.  She has chronic sinus problems and takes steroids for that.  I will call in Flexeril for her to take at night.  Return in one month or earlier if she can get MRI earlier.  Call if any problem.  Precautions  discussed.  Electronically Signed Sanjuana Kava, MD 9/21/202310:29 AM

## 2022-01-20 NOTE — Patient Instructions (Addendum)
I will contact your insurance company regarding an MRI for your back. I will call you after I find out what is required before they will authorize an MRI for you.   Please make sure we have the best number to reach you.

## 2022-01-24 ENCOUNTER — Ambulatory Visit (HOSPITAL_COMMUNITY): Payer: Medicare HMO

## 2022-01-28 ENCOUNTER — Ambulatory Visit (HOSPITAL_COMMUNITY): Payer: Medicare HMO

## 2022-02-09 ENCOUNTER — Other Ambulatory Visit: Payer: Self-pay | Admitting: Internal Medicine

## 2022-02-09 ENCOUNTER — Telehealth: Payer: Self-pay

## 2022-02-09 NOTE — Telephone Encounter (Signed)
A PA for Emgality '120MG'$ /ML auto-injectors has been started on CMM. It has been submitted to the plan. (Key: [id number]) Rx #: [id number].

## 2022-02-10 NOTE — Telephone Encounter (Signed)
[employer] approved Emgality until 05/01/22. Approval faxed to pharmacy.

## 2022-02-17 ENCOUNTER — Ambulatory Visit: Payer: Medicare HMO | Admitting: Orthopaedic Surgery

## 2022-02-21 ENCOUNTER — Ambulatory Visit (HOSPITAL_COMMUNITY)
Admission: RE | Admit: 2022-02-21 | Discharge: 2022-02-21 | Disposition: A | Discharge status: Home / Self Care | Payer: Medicare HMO | Source: Ambulatory Visit | Attending: Orthopaedic Surgery | Admitting: Orthopaedic Surgery

## 2022-02-21 DIAGNOSIS — M79604 Pain in right leg: Secondary | ICD-10-CM | POA: Insufficient documentation

## 2022-02-21 DIAGNOSIS — M545 Low back pain, unspecified: Secondary | ICD-10-CM | POA: Diagnosis not present

## 2022-02-21 DIAGNOSIS — M5117 Intervertebral disc disorders with radiculopathy, lumbosacral region: Secondary | ICD-10-CM | POA: Diagnosis not present

## 2022-02-21 DIAGNOSIS — M4726 Other spondylosis with radiculopathy, lumbar region: Secondary | ICD-10-CM | POA: Diagnosis not present

## 2022-02-24 ENCOUNTER — Ambulatory Visit: Payer: Medicare HMO | Admitting: Orthopaedic Surgery

## 2022-02-24 ENCOUNTER — Encounter: Payer: Self-pay | Admitting: Orthopaedic Surgery

## 2022-02-24 DIAGNOSIS — M79604 Pain in right leg: Secondary | ICD-10-CM

## 2022-02-24 DIAGNOSIS — M545 Low back pain, unspecified: Secondary | ICD-10-CM

## 2022-02-24 NOTE — Progress Notes (Signed)
My back is still hurting.  She has lower back pain still.  She had the MRI of the lumbar spine showing: IMPRESSION: 1. No spinal canal stenosis or neural foraminal narrowing. 2. Multilevel facet arthropathy, worst at L3-L4, which can be a cause of back pain.  I have explained the findings to her.  I will have her see Dr. Ernestina Patches for epidural injection.  I have independently reviewed the MRI.    She is planning to go on a cruise in about 3 weeks.  She wants to have procedure before then.  She has no new trauma.  Her pain is localized to the mid lower back.  Spine/Pelvis examination:  Inspection:  Overall, sacoiliac joint benign and hips nontender; without crepitus or defects.   Thoracic spine inspection: Alignment normal without kyphosis present   Lumbar spine inspection:  Alignment  with normal lumbar lordosis, without scoliosis apparent.   Thoracic spine palpation:  without tenderness of spinal processes   Lumbar spine palpation: without tenderness of lumbar area; without tightness of lumbar muscles    Range of Motion:   Lumbar flexion, forward flexion is normal without pain or tenderness    Lumbar extension is full without pain or tenderness   Left lateral bend is normal without pain or tenderness   Right lateral bend is normal without pain or tenderness   Straight leg raising is normal  Strength & tone: normal   Stability overall normal stability  Encounter Diagnosis  Name Primary?   Lumbar pain with radiation down right leg Yes   To see Dr. Ernestina Patches.  Return in about a month.  Call if any problem.  Precautions discussed.  Electronically Signed Sanjuana Kava, MD 10/26/20238:41 AM

## 2022-02-24 NOTE — Patient Instructions (Addendum)
As the weather changes and gets cooler, you may notice you are affected more. You may have more pain in your joints. This is normal. Dress warmly and make sure that area is covered well.   Marga Hoots [place] PHONE: [phone]  Dr.Fredrick Tyson Dense been referred to see Dr.Newton to discuss an epidural steroid injection in your lower back. If they haven't called you by Monday, please call them and set up your consultation appt.   Dr.Keeling is here all day on Tuesdays, Wednesday mornings, and Thursday mornings. If you need anything such as a medication refill, please either call BEFORE the end of the day on [facility] or send a message through [facility]. Your pharmacy can send a refill request for you. Calling by the end of the day on [facility] allows [place] time to send Dr.Keeling the request and for him to respond before he leaves on Thursdays.  If Dr. Luna Glasgow is out of the office, we may send it to one of the other providers and they may not refill it for the same amount that your original prescription is for.

## 2022-02-25 ENCOUNTER — Other Ambulatory Visit: Payer: Self-pay | Admitting: Internal Medicine

## 2022-02-28 ENCOUNTER — Encounter: Payer: Self-pay | Admitting: Emergency Medicine

## 2022-02-28 ENCOUNTER — Ambulatory Visit
Admission: EM | Admit: 2022-02-28 | Discharge: 2022-02-28 | Disposition: A | Discharge status: Home / Self Care | Payer: Medicare HMO | Attending: Family Medicine | Admitting: Family Medicine

## 2022-02-28 ENCOUNTER — Other Ambulatory Visit: Payer: Self-pay

## 2022-02-28 DIAGNOSIS — N390 Urinary tract infection, site not specified: Secondary | ICD-10-CM | POA: Diagnosis not present

## 2022-02-28 LAB — POCT URINALYSIS DIP (MANUAL ENTRY)
Bilirubin, UA: NEGATIVE
Blood, UA: NEGATIVE
Glucose, UA: >=1000 mg/dL — AB
Leukocytes, UA: NEGATIVE
Nitrite, UA: POSITIVE — AB
Protein Ur, POC: 30 mg/dL — AB
Spec Grav, UA: 1.015 (ref 1.010–1.025)
Urobilinogen, UA: 1 E.U./dL
pH, UA: 6 (ref 5.0–8.0)

## 2022-02-28 MED ORDER — CEPHALEXIN 500 MG PO CAPS: 500.0000 mg | ORAL_CAPSULE | Freq: Two times a day (BID) | ORAL | 0 refills | Status: DC

## 2022-02-28 NOTE — ED Triage Notes (Signed)
Pt reports urinary frequency and lower abdominal pain. Pt reports has taken AZO Saturday and Sunday. Pt reports intermittent vaginal pain as well. Pt reports is not sexually active but reports intermittent vaginal discharge/itching as well.  Reports was on antibiotics x2 weeks ago for tooth infection.

## 2022-02-28 NOTE — ED Provider Notes (Signed)
[facility]    CSN: [id number] Arrival date & time: 02/28/22  1358      History   Chief Complaint Chief Complaint  Patient presents with   Urinary Frequency    HPI Monique Gomez is a 66 y.o. female.   Patient presenting today with several days of urinary frequency, lower abdominal pain, vaginal irritation, vaginal discharge.  Denies hematuria, nausea, vomiting, fever, chills, bowel changes.  Recently came off antibiotics several weeks ago for a dental infection, otherwise no changes in medications or supplements since.  Took some Azo here and there with mild temporary relief of symptoms.  States she has had a history of urinary tract infections years ago that felt similar.    Past Medical History:  Diagnosis Date   Abdominal fibromatosis    Allergic rhinitis    Anemia    Asthma    Essential hypertension    GERD (gastroesophageal reflux disease)    History of cardiac catheterization    No significant CAD May 2015   History of migraine headaches    Hyperlipidemia    Iron deficiency anemia 02/05/2021   Myocarditis ([place])    a. 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   Acquired thrombophilia ([place]) 11/10/2021   Encounter for general adult medical examination with abnormal findings 05/13/2021   Acute pansinusitis 05/13/2021   Iron deficiency anemia 02/05/2021   Arthritis of carpometacarpal ([facility]) joint of left thumb 02/04/2021   Arthritis of carpometacarpal ([facility]) joint of right thumb 02/04/2021   Lumbar radiculopathy 01/11/2021   Myocarditis ([place])    Vitamin D deficiency 07/18/2019   Empty sella syndrome ([place]) 10/24/2018   Hemorrhoids 11/25/2014   Obstructive sleep apnea  10/14/2013   PAF (paroxysmal atrial fibrillation) ([place]) 09/06/2013   NSTEMI (non-ST elevated myocardial infarction) ([place]) 09/03/2013   MDD (major depressive disorder), single episode 02/04/2013   Anxiety state, unspecified 02/04/2013   DM (diabetes mellitus) ([place]) 06/06/2012   Carpal tunnel syndrome of left wrist 12/27/2011   De Quervain's disease (tenosynovitis) 10/09/2011   GERD (gastroesophageal reflux disease) 02/27/2011   Allergic rhinitis 08/29/2010   Anemia 02/15/2010   Hyperlipidemia 12/23/2009   Morbid obesity ([place]) 12/23/2009   Migraine 12/23/2009   HTN (hypertension) 12/23/2009    Past Surgical History:  Procedure Laterality Date   CATARACT EXTRACTION     CATARACT EXTRACTION W/PHACO Right 04/05/2019   Procedure: CATARACT EXTRACTION PHACO AND INTRAOCULAR LENS PLACEMENT RIGHT EYE (CDE: 3.19);  Surgeon: Baruch Goldmann, MD;  Location: [facility];  Service: Ophthalmology;  Laterality: Right;   COLONOSCOPY N/A 02/02/2015   SLF: 1. one colon polyp removed-no source for anemia identified. 2. moderate diverticulosis noted in the sigmoid colon and descending colon 3. the left colon is redundant 4. Rectal bleeding due ot small internal hemorroids 5. Moderate sized external hemorrhoids.   ESOPHAGOGASTRODUODENOSCOPY N/A 02/02/2015   SLF: 1. 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 large hiatal hernia 3. mild non-erosive gastritis 4. No source for anemia identified.    LEFT HEART CATH AND CORONARY ANGIOGRAPHY N/A 05/14/2020   Procedure: LEFT HEART CATH AND CORONARY ANGIOGRAPHY;  Surgeon: Belva Crome, MD;  Location: [facility];  Service: Cardiovascular;  Laterality:  N/A;   LEFT HEART CATHETERIZATION WITH CORONARY ANGIOGRAM N/A 09/03/2013   Procedure: LEFT HEART CATHETERIZATION WITH CORONARY ANGIOGRAM;  Surgeon: Jettie Booze, MD;  Location: [facility] C[facility];  Service: Cardiovascular;  Laterality: N/A;   Right carpal tunnel release      OB History     Gravida  4   Para   2   Term      Preterm      AB      Living         SAB      IAB      Ectopic      Multiple      Live Births               Home Medications    Prior to Admission medications   Medication Sig Start Date End Date Taking? Authorizing Provider  cephALEXin (KEFLEX) 500 MG capsule Take 1 capsule (500 mg total) by mouth 2 (two) times daily. 02/28/22  Yes Volney American, PA-C  acetaminophen (TYLENOL) 500 MG tablet Take 1 tablet (500 mg total) by mouth every 6 (six) hours as needed. 01/11/20   Avegno, Darrelyn Hillock, FNP  atorvastatin (LIPITOR) 80 MG tablet Take 1 tablet by mouth once daily 06/18/21   Lindell Spar, MD  blood glucose meter kit and supplies Dispense based on patient and insurance preference. Use up to four times daily as directed. (FOR ICD-10 E10.9, E11.9). 11/10/21   Lindell Spar, MD  botulinum toxin Type A (BOTOX) 200 units injection Inject 155 Units into the muscle every 3 (three) months. 12/13/21   Suzzanne Cloud, NP  cyclobenzaprine (FLEXERIL) 10 MG tablet Take 1 tablet (10 mg total) by mouth at bedtime. One tablet every night at bedtime as needed for spasm. 01/20/22   Sanjuana Kava, MD  ELIQUIS 5 MG TABS tablet Take 1 tablet by mouth twice daily 01/04/22   Lindell Spar, MD  fluticasone [facility]) 50 MCG/ACT nasal spray Place 2 sprays into both nostrils daily. 05/13/21   Lindell Spar, MD  Galcanezumab-gnlm [facility]) 120 MG/ML SOAJ INJECT 120 MG  SUBCUTANEOUSLY ONCE EVERY 30 DAYS 11/10/21   Suzzanne Cloud, NP  ipratropium (ATROVENT) 0.03 % nasal spray Place 2 sprays into both nostrils every 12 (twelve) hours. 11/27/19   Alycia Rossetti, MD  Iron-FA-B Cmp-C-Biot-Probiotic (FUSION PLUS) CAPS Take 2 capsules by mouth once daily 08/26/21   Lindell Spar, MD  JARDIANCE 10 MG TABS tablet TAKE 1 TABLET BY MOUTH ONCE DAILY BEFORE BREAKFAST 10/06/21   Satira Sark, MD  meclizine (ANTIVERT) 25 MG tablet Take 1 tablet (25 mg total) by mouth 3 (three) times  daily as needed for dizziness. 01/11/21   Lindell Spar, MD  metFORMIN (GLUCOPHAGE) 500 MG tablet TAKE 1 TABLET BY MOUTH TWICE DAILY WITH A MEAL 02/09/22   Lindell Spar, MD  metoCLOPramide (REGLAN) 10 MG tablet Take 1 tablet (10 mg total) by mouth every 8 (eight) hours as needed for up to 7 days for nausea (Headache). 10/13/18   Noemi Chapel, MD  metoprolol succinate (TOPROL-XL) 50 MG 24 hr tablet TAKE 1 TABLET BY MOUTH  IN THE MORNING AND AT BEDTIME. TAKE WITH OR IMMEDIATELY FOLLOWING A MEAL 06/18/21   Satira Sark, MD  pantoprazole (PROTONIX) 40 MG tablet Take 1 tablet by mouth once daily 02/25/22   Lindell Spar, MD  sacubitril-valsartan (ENTRESTO) 97-103 MG Take 1 tablet by mouth 2 (two) times daily.  01/20/22   Strader, Fransisco Hertz, PA-C  topiramate (TOPAMAX) 50 MG tablet Take 2 tablets (100 mg total) by mouth at bedtime. 11/10/21   Suzzanne Cloud, NP  Ubrogepant (UBRELVY) 50 MG TABS TAKE ONE TABLET BY MOUTH AT THE ONSET OF A MIGRAINE. CAN REPEAT IN 2 HOURS IF NEEDED 11/10/21   Suzzanne Cloud, NP    Family History Family History  Problem Relation Age of Onset   Diabetes Mother    Hypertension Mother    Heart disease Mother    High Cholesterol Mother    Alzheimer's disease Father    Hypertension Sister    Hypertension Brother    Diabetes Brother    Arthritis Sister    Hypertension Sister    High Cholesterol Sister    Diabetes Son    Developmental delay Son    Colon cancer Neg Hx     Social History Social History   Tobacco Use   Smoking status: Former    Packs/day: 0.30    Types: Cigarettes    Quit date: 04/01/2012    Years since quitting: 9.9   Smokeless tobacco: Never  Vaping Use   Vaping Use: Never used  Substance Use Topics   Alcohol use: Not Currently    Alcohol/week: 0.0 standard drinks of alcohol    Comment: very occasional   Drug use: No     Allergies   Venlafaxine   Review of Systems Review of Systems Per HPI  Physical Exam Triage Vital  Signs ED Triage Vitals [02/28/22 1525]  Enc Vitals Group     BP      Pulse      Resp      Temp      Temp src      SpO2      Weight      Height      Head Circumference      Peak Flow      Pain Score 5     Pain Loc      Pain Edu?      Excl. in [place]?    No data found.  Updated Vital Signs There were no vitals taken for this visit.  Visual Acuity Right Eye Distance:   Left Eye Distance:   Bilateral Distance:    Right Eye Near:   Left Eye Near:    Bilateral Near:     Physical Exam Vitals and nursing note reviewed.  Constitutional:      Appearance: Normal appearance. She is not ill-appearing.  HENT:     Head: Atraumatic.  Eyes:     Extraocular Movements: Extraocular movements intact.     Conjunctiva/sclera: Conjunctivae normal.  Cardiovascular:     Rate and Rhythm: Normal rate and regular rhythm.     Heart sounds: Normal heart sounds.  Pulmonary:     Effort: Pulmonary effort is normal.     Breath sounds: Normal breath sounds.  Abdominal:     General: Bowel sounds are normal. There is no distension.     Palpations: Abdomen is soft.     Tenderness: There is no abdominal tenderness. There is no right CVA tenderness, left CVA tenderness or guarding.  Genitourinary:    Comments: GU exam deferred, self swab performed Musculoskeletal:        General: Normal range of motion.     Cervical back: Normal range of motion and neck supple.  Skin:    General: Skin is warm and dry.  Neurological:  Mental Status: She is alert and oriented to person, place, and time.  Psychiatric:        Mood and Affect: Mood normal.        Thought Content: Thought content normal.        Judgment: Judgment normal.    UC Treatments / Results  Labs (all labs ordered are listed, but only abnormal results are displayed) Labs Reviewed  POCT URINALYSIS DIP (MANUAL ENTRY) - Abnormal; Notable for the following components:      Result Value   Glucose, UA >=1,000 (*)    Ketones, POC UA trace  (5) (*)    Protein Ur, POC =30 (*)    Nitrite, UA Positive (*)    All other components within normal limits  URINE CULTURE  CERVICOVAGINAL ANCILLARY ONLY    EKG   Radiology No results found.  Procedures Procedures (including critical care time)  Medications Ordered in UC Medications - No data to display  Initial Impression / Assessment and Plan / UC Course  I have reviewed the triage vital signs and the nursing notes.  Pertinent labs & imaging results that were available during my care of the patient were reviewed by me and considered in my medical decision making (see chart for details).     exam reassuring today, urinalysis with evidence of a possible urinary tract infection.  Urine culture and vaginal swab pending.  Adjust as needed based on these.  Azo as needed in addition to other supportive measures.  Final Clinical Impressions(s) / UC Diagnoses   Final diagnoses:  Acute lower UTI   Discharge Instructions   None    ED Prescriptions     Medication Sig Dispense Auth. Provider   cephALEXin (KEFLEX) 500 MG capsule Take 1 capsule (500 mg total) by mouth 2 (two) times daily. 10 capsule Volney American, [place]      PDMP not reviewed this encounter.   Volney American, [place] 02/28/22 1755

## 2022-03-01 LAB — CERVICOVAGINAL ANCILLARY ONLY
Bacterial Vaginitis (gardnerella): NEGATIVE
Candida Glabrata: NEGATIVE
Candida Vaginitis: NEGATIVE
Comment: NEGATIVE
Comment: NEGATIVE
Comment: NEGATIVE

## 2022-03-02 LAB — URINE CULTURE

## 2022-03-07 ENCOUNTER — Ambulatory Visit (INDEPENDENT_AMBULATORY_CARE_PROVIDER_SITE_OTHER): Payer: Medicare HMO | Admitting: Physical Medicine and Rehabilitation

## 2022-03-07 ENCOUNTER — Ambulatory Visit: Payer: Self-pay

## 2022-03-07 VITALS — BP 116/83 | HR 103

## 2022-03-07 DIAGNOSIS — M5416 Radiculopathy, lumbar region: Secondary | ICD-10-CM | POA: Diagnosis not present

## 2022-03-07 MED ORDER — METHYLPREDNISOLONE ACETATE 80 MG/ML IJ SUSP
40.0000 mg | Freq: Once | INTRAMUSCULAR | Status: AC
  Administered 2022-03-07: 40 mg

## 2022-03-07 NOTE — Progress Notes (Signed)
Numeric Pain Rating Scale and Functional Assessment Average Pain 5   In the last MONTH (on 0-10 scale) has pain interfered with the following?  1. General activity like being  able to carry out your everyday physical activities such as walking, climbing stairs, carrying groceries, or moving a chair?  Rating( varies 6-10 )   +Driver, -BT- Eliquis (stopped 03/05/22), -Dye Allergies.  Standing for long periods of time makes pain worse. Pain lower back all the way across. Pain can radiate down both hips and legs and can sometimes go into pelvis

## 2022-03-07 NOTE — Patient Instructions (Signed)

## 2022-03-08 ENCOUNTER — Inpatient Hospital Stay: Payer: Medicare HMO

## 2022-03-08 ENCOUNTER — Telehealth: Payer: Self-pay | Admitting: Neurology

## 2022-03-08 NOTE — Telephone Encounter (Signed)
Monique Gomez is calling. Stated Botox will be delivery on 11/9 with no signature required.

## 2022-03-09 ENCOUNTER — Inpatient Hospital Stay: Payer: Medicare HMO | Attending: Hematology

## 2022-03-09 DIAGNOSIS — Z7901 Long term (current) use of anticoagulants: Secondary | ICD-10-CM | POA: Diagnosis not present

## 2022-03-09 DIAGNOSIS — R590 Localized enlarged lymph nodes: Secondary | ICD-10-CM

## 2022-03-09 DIAGNOSIS — Z87891 Personal history of nicotine dependence: Secondary | ICD-10-CM | POA: Insufficient documentation

## 2022-03-09 DIAGNOSIS — R718 Other abnormality of red blood cells: Secondary | ICD-10-CM

## 2022-03-09 DIAGNOSIS — I480 Paroxysmal atrial fibrillation: Secondary | ICD-10-CM | POA: Diagnosis not present

## 2022-03-09 DIAGNOSIS — Z79899 Other long term (current) drug therapy: Secondary | ICD-10-CM | POA: Diagnosis not present

## 2022-03-09 DIAGNOSIS — Z7984 Long term (current) use of oral hypoglycemic drugs: Secondary | ICD-10-CM | POA: Insufficient documentation

## 2022-03-09 DIAGNOSIS — D509 Iron deficiency anemia, unspecified: Secondary | ICD-10-CM

## 2022-03-09 DIAGNOSIS — E119 Type 2 diabetes mellitus without complications: Secondary | ICD-10-CM | POA: Insufficient documentation

## 2022-03-09 DIAGNOSIS — I10 Essential (primary) hypertension: Secondary | ICD-10-CM | POA: Diagnosis not present

## 2022-03-09 LAB — IRON AND TIBC
Iron: 70 ug/dL (ref 28–170)
Saturation Ratios: 21 % (ref 10.4–31.8)
TIBC: 328 ug/dL (ref 250–450)
UIBC: 258 ug/dL

## 2022-03-09 LAB — CBC WITH DIFFERENTIAL/PLATELET
Abs Immature Granulocytes: 0.05 10*3/uL (ref 0.00–0.07)
Basophils Absolute: 0.1 10*3/uL (ref 0.0–0.1)
Basophils Relative: 1 %
Eosinophils Absolute: 0.3 10*3/uL (ref 0.0–0.5)
Eosinophils Relative: 2 %
HCT: 40.3 % (ref 36.0–46.0)
Hemoglobin: 11.8 g/dL — ABNORMAL LOW (ref 12.0–15.0)
Immature Granulocytes: 1 %
Lymphocytes Relative: 20 %
Lymphs Abs: 2.2 10*3/uL (ref 0.7–4.0)
MCH: 21.5 pg — ABNORMAL LOW (ref 26.0–34.0)
MCHC: 29.3 g/dL — ABNORMAL LOW (ref 30.0–36.0)
MCV: 73.4 fL — ABNORMAL LOW (ref 80.0–100.0)
Monocytes Absolute: 0.7 10*3/uL (ref 0.1–1.0)
Monocytes Relative: 6 %
Neutro Abs: 7.6 10*3/uL (ref 1.7–7.7)
Neutrophils Relative %: 70 %
Platelets: 333 10*3/uL (ref 150–400)
RBC: 5.49 MIL/uL — ABNORMAL HIGH (ref 3.87–5.11)
RDW: 16.3 % — ABNORMAL HIGH (ref 11.5–15.5)
WBC: 10.8 10*3/uL — ABNORMAL HIGH (ref 4.0–10.5)
nRBC: 0 % (ref 0.0–0.2)

## 2022-03-09 LAB — FERRITIN: Ferritin: 22 ng/mL (ref 11–307)

## 2022-03-11 LAB — HGB FRACTIONATION CASCADE
Hgb A2: 2.5 % (ref 1.8–3.2)
Hgb A: 97.5 % (ref 96.4–98.8)
Hgb F: 0 % (ref 0.0–2.0)
Hgb S: 0 %

## 2022-03-14 ENCOUNTER — Ambulatory Visit (HOSPITAL_COMMUNITY)
Admission: RE | Admit: 2022-03-14 | Discharge: 2022-03-14 | Disposition: A | Discharge status: Home / Self Care | Payer: Medicare HMO | Source: Ambulatory Visit | Attending: Internal Medicine | Admitting: Internal Medicine

## 2022-03-14 DIAGNOSIS — Z780 Asymptomatic menopausal state: Secondary | ICD-10-CM | POA: Diagnosis not present

## 2022-03-14 DIAGNOSIS — Z1231 Encounter for screening mammogram for malignant neoplasm of breast: Secondary | ICD-10-CM | POA: Insufficient documentation

## 2022-03-14 DIAGNOSIS — M85852 Other specified disorders of bone density and structure, left thigh: Secondary | ICD-10-CM | POA: Diagnosis not present

## 2022-03-14 NOTE — Progress Notes (Unsigned)
Monique Gomez, Monique Gomez [id number]   CLINIC:  Medical Oncology/Hematology  PCP:  Lindell Spar, MD [address] [place] [address] [phone]   REASON FOR VISIT:  Follow-up for lymphadenopathy and iron deficiency anemia   PRIOR THERAPY: None   CURRENT THERAPY: Intermittent IV iron, iron tablet daily   INTERVAL HISTORY:  Monique Gomez 66 y.o. female returns for routine follow-up of lymphadenopathy and iron deficiency anemia.  She was last seen by Tarri Abernethy PA-C on 08/31/2021.   At today's visit, she reports feeling fairly well. *** No recent hospitalizations, surgeries, or changes in baseline health status.  ***   She continues to take her iron tablet twice daily.  She reports that her stools are dark from her iron supplementation, but she denies any gross hematochezia, melena, or epistaxis.  She reports that her energy is ***  at about *** %.  She has chronic migraines and sinus headaches.  She denies any restless legs, pica, chest pain, dyspnea on exertion, lightheadedness, or syncope.   She denies any current lymphadenopathy in her neck or collarbone region. ***  She reports that she has intermittent swollen lymph nodes on her collarbone that are associated with flareups and her chronic sinusitis.  She reports that she continues to see Dr. Benjamine Mola due to chronic inflammation and swelling of her nasal passages related to narrow nasal passages.   *** She denies any recent infections, fever, chills, night sweats, or unintentional weight loss.   She has *** % energy and *** % appetite. She endorses that she is maintaining a stable weight.   REVIEW OF SYSTEMS: ***  Review of Systems  Constitutional:  Positive for fatigue. Negative for appetite change, chills, diaphoresis, fever and unexpected weight change.  HENT:   Negative for lump/mass and nosebleeds.        Sinus issues  Eyes:  Negative for eye problems.  Respiratory:  Positive for  shortness of breath (from sinuses). Negative for cough and hemoptysis.   Cardiovascular:  Positive for palpitations. Negative for chest pain and leg swelling.  Gastrointestinal:  Negative for abdominal pain, blood in stool, constipation, diarrhea, nausea and vomiting.  Genitourinary:  Negative for hematuria.   Skin: Negative.   Neurological:  Positive for dizziness and headaches. Negative for light-headedness.  Hematological:  Does not bruise/bleed easily.      PAST MEDICAL/SURGICAL HISTORY:  Past Medical History:  Diagnosis Date   Abdominal fibromatosis    Allergic rhinitis    Anemia    Asthma    Essential hypertension    GERD (gastroesophageal reflux disease)    History of cardiac catheterization    No significant CAD May 2015   History of migraine headaches    Hyperlipidemia    Iron deficiency anemia 02/05/2021   Myocarditis ([place])    a. diagnosed in 05/2020 with cath showing normal cors   Obesity    PAF (paroxysmal atrial fibrillation) ([place]) 08/2013   Sleep apnea    Type 2 diabetes mellitus [facility])    Past Surgical History:  Procedure Laterality Date   CATARACT EXTRACTION     CATARACT EXTRACTION W/PHACO Right 04/05/2019   Procedure: CATARACT EXTRACTION PHACO AND INTRAOCULAR LENS PLACEMENT RIGHT EYE (CDE: 3.19);  Surgeon: Baruch Goldmann, MD;  Location: [facility];  Service: Ophthalmology;  Laterality: Right;   COLONOSCOPY N/A 02/02/2015   SLF: 1. one colon polyp removed-no source for anemia identified. 2. moderate diverticulosis noted in the sigmoid colon and  descending colon 3. the left colon is redundant 4. Rectal bleeding due ot small internal hemorroids 5. Moderate sized external hemorrhoids.   ESOPHAGOGASTRODUODENOSCOPY N/A 02/02/2015   SLF: 1. 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 large hiatal hernia 3. mild non-erosive gastritis 4. No source for anemia identified.    LEFT HEART CATH AND CORONARY ANGIOGRAPHY N/A 05/14/2020   Procedure: LEFT HEART CATH AND  CORONARY ANGIOGRAPHY;  Surgeon: Belva Crome, MD;  Location: [facility];  Service: Cardiovascular;  Laterality: N/A;   LEFT HEART CATHETERIZATION WITH CORONARY ANGIOGRAM N/A 09/03/2013   Procedure: LEFT HEART CATHETERIZATION WITH CORONARY ANGIOGRAM;  Surgeon: Jettie Booze, MD;  Location: [facility] C[facility];  Service: Cardiovascular;  Laterality: N/A;   Right carpal tunnel release       SOCIAL HISTORY:  Social History   Socioeconomic History   Marital status: Single    Spouse name: Not on file   Number of children: 2   Years of education: Not on file   Highest education level: Not on file  Occupational History   Occupation: Education officer, museum    Employer: [employer]  Tobacco Use   Smoking status: Former    Packs/day: 0.30    Types: Cigarettes    Quit date: 04/01/2012    Years since quitting: 9.9   Smokeless tobacco: Never  Vaping Use   Vaping Use: Never used  Substance and Sexual Activity   Alcohol use: Not Currently    Alcohol/week: 0.0 standard drinks of alcohol    Comment: very occasional   Drug use: No   Sexual activity: Yes  Other Topics Concern   Not on file  Social History Narrative   Not on file   Social Determinants of Health   Financial Resource Strain: Low Risk  (01/18/2022)   Overall Financial Resource Strain (CARDIA)    Difficulty of Paying Living Expenses: Not hard at all  Food Insecurity: No Food Insecurity (01/18/2022)   Hunger Vital Sign    Worried About Running Out of Food in the Last Year: Never true    Ran Out of Food in the Last Year: Never true  Transportation Needs: No Transportation Needs (01/18/2022)   PRAPARE - Hydrologist (Medical): No    Lack of Transportation (Non-Medical): No  Physical Activity: Insufficiently Active (01/18/2022)   Exercise Vital Sign    Days of Exercise per Week: 3 days    Minutes of Exercise per Session: 30 min  Stress: No Stress Concern Present (01/18/2022)   [place]    Feeling of Stress : Not at all  Social Connections: Socially Isolated (01/18/2022)   Social Connection and Isolation Panel [NHANES]    Frequency of Communication with Friends and Family: More than three times a week    Frequency of Social Gatherings with Friends and Family: More than three times a week    Attends Religious Services: Never    Marine scientist or Organizations: No    Attends Archivist Meetings: Never    Marital Status: Never married  Intimate Partner Violence: Not At Risk (01/18/2022)   Humiliation, Afraid, Rape, and Kick questionnaire    Fear of Current or Ex-Partner: No    Emotionally Abused: No    Physically Abused: No    Sexually Abused: No    FAMILY HISTORY:  Family History  Problem Relation Age of Onset   Diabetes Mother  Hypertension Mother    Heart disease Mother    High Cholesterol Mother    Alzheimer's disease Father    Hypertension Sister    Hypertension Brother    Diabetes Brother    Arthritis Sister    Hypertension Sister    High Cholesterol Sister    Diabetes Son    Developmental delay Son    Colon cancer Neg Hx     CURRENT MEDICATIONS:  Outpatient Encounter Medications as of 03/15/2022  Medication Sig Note   acetaminophen (TYLENOL) 500 MG tablet Take 1 tablet (500 mg total) by mouth every 6 (six) hours as needed.    atorvastatin (LIPITOR) 80 MG tablet Take 1 tablet by mouth once daily    blood glucose meter kit and supplies Dispense based on patient and insurance preference. Use up to four times daily as directed. (FOR ICD-10 E10.9, E11.9).    botulinum toxin Type A (BOTOX) 200 units injection Inject 155 Units into the muscle every 3 (three) months.    cephALEXin (KEFLEX) 500 MG capsule Take 1 capsule (500 mg total) by mouth 2 (two) times daily.    cyclobenzaprine (FLEXERIL) 10 MG tablet Take 1 tablet (10 mg total) by mouth at bedtime. One tablet every night at  bedtime as needed for spasm.    ELIQUIS 5 MG TABS tablet Take 1 tablet by mouth twice daily    fluticasone (FLONASE) 50 MCG/ACT nasal spray Place 2 sprays into both nostrils daily.    Galcanezumab-gnlm (EMGALITY) 120 MG/ML SOAJ INJECT 120 MG  SUBCUTANEOUSLY ONCE EVERY 30 DAYS 02/10/2022: 02/10/22 approved until 05/01/22   ipratropium (ATROVENT) 0.03 % nasal spray Place 2 sprays into both nostrils every 12 (twelve) hours.    Iron-FA-B Cmp-C-Biot-Probiotic (FUSION PLUS) CAPS Take 2 capsules by mouth once daily    JARDIANCE 10 MG TABS tablet TAKE 1 TABLET BY MOUTH ONCE DAILY BEFORE BREAKFAST    meclizine (ANTIVERT) 25 MG tablet Take 1 tablet (25 mg total) by mouth 3 (three) times daily as needed for dizziness.    metFORMIN (GLUCOPHAGE) 500 MG tablet TAKE 1 TABLET BY MOUTH TWICE DAILY WITH A MEAL    metoCLOPramide (REGLAN) 10 MG tablet Take 1 tablet (10 mg total) by mouth every 8 (eight) hours as needed for up to 7 days for nausea (Headache).    metoprolol succinate (TOPROL-XL) 50 MG 24 hr tablet TAKE 1 TABLET BY MOUTH  IN THE MORNING AND AT BEDTIME. TAKE WITH OR IMMEDIATELY FOLLOWING A MEAL    pantoprazole (PROTONIX) 40 MG tablet Take 1 tablet by mouth once daily    sacubitril-valsartan (ENTRESTO) 97-103 MG Take 1 tablet by mouth 2 (two) times daily.    topiramate (TOPAMAX) 50 MG tablet Take 2 tablets (100 mg total) by mouth at bedtime.    Ubrogepant (UBRELVY) 50 MG TABS TAKE ONE TABLET BY MOUTH AT THE ONSET OF A MIGRAINE. CAN REPEAT IN 2 HOURS IF NEEDED    Facility-Administered Encounter Medications as of 03/15/2022  Medication   methylPREDNISolone acetate (DEPO-MEDROL) injection 40 mg    ALLERGIES:  Allergies  Allergen Reactions   Venlafaxine Nausea And Vomiting and Other (See Comments)    Dizziness, shakiness     PHYSICAL EXAM: ***  ECOG PERFORMANCE STATUS: 1 - Symptomatic but completely ambulatory  There were no vitals filed for this visit. There were no vitals filed for this  visit. Physical Exam Constitutional:      Appearance: Normal appearance. She is obese.  HENT:     Head: Normocephalic  and atraumatic.     Mouth/Throat:     Mouth: Mucous membranes are moist.  Eyes:     Extraocular Movements: Extraocular movements intact.     Pupils: Pupils are equal, round, and reactive to light.  Cardiovascular:     Rate and Rhythm: Normal rate and regular rhythm.     Pulses: Normal pulses.     Heart sounds: Normal heart sounds.  Pulmonary:     Effort: Pulmonary effort is normal.     Breath sounds: Normal breath sounds.     Comments: Lung sounds obscured due to body habitus Abdominal:     General: Bowel sounds are normal.     Palpations: Abdomen is soft.     Tenderness: There is no abdominal tenderness.  Musculoskeletal:        General: No swelling.     Right lower leg: No edema.     Left lower leg: No edema.  Lymphadenopathy:     Cervical: No cervical adenopathy.     Right cervical: No superficial or deep cervical adenopathy.    Left cervical: No superficial, deep or posterior cervical adenopathy.     Upper Body:     Right upper body: No supraclavicular or axillary adenopathy.     Left upper body: No supraclavicular or axillary adenopathy.  Skin:    General: Skin is warm and dry.  Neurological:     General: No focal deficit present.     Mental Status: She is alert and oriented to person, place, and time.  Psychiatric:        Mood and Affect: Mood normal.        Behavior: Behavior normal.     LABORATORY DATA:  I have reviewed the labs as listed.  CBC    Component Value Date/Time   WBC 10.8 (H) 03/09/2022 1253   RBC 5.49 (H) 03/09/2022 1253   HGB 11.8 (L) 03/09/2022 1253   HGB 13.4 05/13/2021 0848   HCT 40.3 03/09/2022 1253   HCT 44.8 05/13/2021 0848   PLT 333 03/09/2022 1253   PLT 345 05/13/2021 0848   MCV 73.4 (L) 03/09/2022 1253   MCV 73 (L) 05/13/2021 0848   MCH 21.5 (L) 03/09/2022 1253   MCHC 29.3 (L) 03/09/2022 1253   RDW 16.3 (H)  03/09/2022 1253   RDW 16.3 (H) 05/13/2021 0848   LYMPHSABS 2.2 03/09/2022 1253   LYMPHSABS 2.1 05/13/2021 0848   MONOABS 0.7 03/09/2022 1253   EOSABS 0.3 03/09/2022 1253   EOSABS 0.2 05/13/2021 0848   BASOSABS 0.1 03/09/2022 1253   BASOSABS 0.0 05/13/2021 0848      Latest Ref Rng & Units 11/02/2021    9:26 AM 05/13/2021    8:48 AM 01/11/2021    9:52 AM  CMP  Glucose 70 - 99 mg/dL 137  124  141   BUN 8 - 23 mg/dL _0 Creatinine 0.44 - 1.00 mg/dL 0.94  0.86  1.04   Sodium 135 - 145 mmol/L 140  146  137   Potassium 3.5 - 5.1 mmol/L 3.6  4.2  4.3   Chloride 98 - 111 mmol/L 109  108  104   CO2 22 - 32 mmol/L _1 Calcium 8.9 - 10.3 mg/dL 9.0  10.1  9.6   Total Protein 6.0 - 8.5 g/dL  7.1    Total Bilirubin 0.0 - 1.2 mg/dL  <0.2    Alkaline Phos 44 - 121 IU/L  100    AST 0 - 40 IU/L  16    ALT 0 - 32 IU/L  14      DIAGNOSTIC IMAGING:  I have independently reviewed the relevant imaging and discussed with the patient.   ASSESSMENT & PLAN: 1.  Subcentimeter left supraclavicular adenopathy, waxing and waning - She reported lymphadenopathy since ~September 2020 - Ultrasound soft tissue neck on 01/25/2019 shows left supraclavicular lymph node measuring 1 cm.  A CT of the soft tissue of the neck on the same day showed 3 small supraclavicular lymph nodes, largest measuring 8-9 mm.  Small posterior triangle lymph node, none more than 9 mm.  Consistent with mild reactive adenitis. - CT soft tissue neck with contrast on 02/22/2019 showed right level 2 lymph node measuring 6 mm unchanged.  Subcentimeter posterior triangle lymph nodes on the right unchanged.  Largest measures 8 mm.  Left level 2 lymph node measures 8.6 mm unchanged.  Cluster of supraclavicular lymph nodes on the left also slightly smaller.  Largest measures 5 mm.  No oropharyngeal masses. - CT soft tissue neck on 06/17/2019 shows left level 2 lymph node measuring 1.2 cm, previously 1.1 cm.  Stable cluster of  nonenlarged left supraclavicular lymph nodes. - CT angiogram from 05/13/2020 which showed no lymphadenopathy in the base of the neck.  No thoracic adenopathy.  Diffuse normal-sized mediastinal and axillary lymph nodes noted. - Former smoker.  She smoked 1 pack of cigarettes per week for 35 years.  She quit smoking in 2013. - She sees Dr. Benjamine Mola for chronic sinusitis  - No recent infections or antibiotics ***  - She does not report any fevers, night sweats or weight loss in the last 6 months.  ***  - Physical examination today did not reveal any palpable adenopathy in the neck region or axillary region.  ***  - Most recent CBC (03/09/22): mildly elevated WBC 10.8, normal differential. - Suspect intermittent reactive lymphadenitis/lymphadenopathy, likely related to flareups of her chronic sinusitis - PLAN: No further work-up indicated at this time.  We will reassess with physical exam and follow-up visit in 6 months.   2.  Iron deficiency anemia: - She reports taking iron tablet daily for many years.   - EGD/colonoscopy (02/02/2015): Nonerosive gastritis, no source for anemia identified on EGD.  Polyp and diverticulosis, rectal bleeding from small internal hemorrhoids. - No major bleeding episodes such as epistaxis, hematemesis, hematochezia, or melena  *** - She received Venofer 1000 mg in divided doses in October 2022 - Most recent labs (03/09/2022): Normal Hgb Hgb 11.8/MCV 73.4, RBC 5.49.  Ferritin 22, iron saturation 21%. - Longstanding history of microcytic erythrocytosis, regardless of hemoglobin levels.  Hemoglobin fractionation cascade was negative, but this does not rule out alpha thalassemia trait.   - PLAN: Recommend Venofer 300 mg x 3*** - Continue oral iron supplementation - Repeat CBC & iron panel and follow-up in 6 months.***  PLAN SUMMARY: >> IV Venofer 300 mg x 3 >> Labs in 6 months (CBC/D, ferritin, iron/TIBC) >> Office visit 1 week after labs  All questions were answered. The  patient knows to call the clinic with any problems, questions or concerns.  Medical decision making: ***  Time spent on visit: I spent *** minutes counseling the patient face to face. The total time spent in the appointment was *** minutes and more than 50% was on counseling.   Harriett Rush, PA-C  ***

## 2022-03-15 ENCOUNTER — Ambulatory Visit
Admission: EM | Admit: 2022-03-15 | Discharge: 2022-03-15 | Disposition: A | Discharge status: Home / Self Care | Payer: Medicare HMO | Source: Home / Self Care

## 2022-03-15 ENCOUNTER — Other Ambulatory Visit: Payer: Self-pay

## 2022-03-15 ENCOUNTER — Inpatient Hospital Stay: Payer: Medicare HMO | Admitting: Physician Assistant

## 2022-03-15 VITALS — BP 143/94 | HR 114 | Temp 96.7°F | Resp 20 | Ht 62.99 in | Wt 267.2 lb

## 2022-03-15 DIAGNOSIS — D508 Other iron deficiency anemias: Secondary | ICD-10-CM

## 2022-03-15 DIAGNOSIS — I480 Paroxysmal atrial fibrillation: Secondary | ICD-10-CM | POA: Diagnosis not present

## 2022-03-15 DIAGNOSIS — R590 Localized enlarged lymph nodes: Secondary | ICD-10-CM | POA: Diagnosis not present

## 2022-03-15 DIAGNOSIS — Z87891 Personal history of nicotine dependence: Secondary | ICD-10-CM | POA: Diagnosis not present

## 2022-03-15 DIAGNOSIS — Z79899 Other long term (current) drug therapy: Secondary | ICD-10-CM | POA: Diagnosis not present

## 2022-03-15 DIAGNOSIS — E119 Type 2 diabetes mellitus without complications: Secondary | ICD-10-CM | POA: Diagnosis not present

## 2022-03-15 DIAGNOSIS — D509 Iron deficiency anemia, unspecified: Secondary | ICD-10-CM

## 2022-03-15 DIAGNOSIS — R300 Dysuria: Secondary | ICD-10-CM | POA: Insufficient documentation

## 2022-03-15 DIAGNOSIS — Z7984 Long term (current) use of oral hypoglycemic drugs: Secondary | ICD-10-CM | POA: Diagnosis not present

## 2022-03-15 DIAGNOSIS — I10 Essential (primary) hypertension: Secondary | ICD-10-CM | POA: Diagnosis not present

## 2022-03-15 DIAGNOSIS — R718 Other abnormality of red blood cells: Secondary | ICD-10-CM

## 2022-03-15 DIAGNOSIS — Z7901 Long term (current) use of anticoagulants: Secondary | ICD-10-CM | POA: Diagnosis not present

## 2022-03-15 LAB — POCT URINALYSIS DIP (MANUAL ENTRY)
Blood, UA: NEGATIVE
Glucose, UA: >=1000 mg/dL — AB
Nitrite, UA: POSITIVE — AB
Protein Ur, POC: 100 mg/dL — AB
Spec Grav, UA: 1.015 (ref 1.010–1.025)
Urobilinogen, UA: 4 E.U./dL — AB
pH, UA: 5 (ref 5.0–8.0)

## 2022-03-15 MED ORDER — FUSION PLUS PO CAPS: 1.0000 | ORAL_CAPSULE | Freq: Every day | ORAL | 11 refills | Status: DC

## 2022-03-15 MED ORDER — SULFAMETHOXAZOLE-TRIMETHOPRIM 800-160 MG PO TABS: 1.0000 | ORAL_TABLET | Freq: Two times a day (BID) | ORAL | 0 refills | Status: AC

## 2022-03-15 NOTE — Progress Notes (Signed)
Monique Gomez - 66 y.o. female MRN [id number]  Date of birth: 1956-01-12  Office Visit Note: Visit Date: 03/07/2022 PCP: Lindell Spar, MD Referred by: Lindell Spar, MD  Subjective: Chief Complaint  Patient presents with   Lower Back - Pain   HPI:  Monique Gomez is a 66 y.o. female who comes in today at the request of Dr. Sanjuana Kava for planned Right L4-5 Lumbar Interlaminar epidural steroid injection with fluoroscopic guidance.  The patient has failed conservative care including home exercise, medications, time and activity modification.  This injection will be diagnostic and hopefully therapeutic.  Please see requesting physician notes for further details and justification.   ROS Otherwise per HPI.  Assessment & Plan: Visit Diagnoses:    ICD-10-CM   1. Lumbar radiculopathy  M54.16 XR C-ARM NO REPORT    Epidural Steroid injection    methylPREDNISolone acetate (DEPO-MEDROL) injection 40 mg      Plan: No additional findings.   Meds & Orders:  Meds ordered this encounter  Medications   methylPREDNISolone acetate (DEPO-MEDROL) injection 40 mg    Orders Placed This Encounter  Procedures   XR C-ARM NO REPORT   Epidural Steroid injection    Follow-up: Return if symptoms worsen or fail to improve.   Procedures: No procedures performed  Lumbar Epidural Steroid Injection - Interlaminar Approach with Fluoroscopic Guidance  Patient: Monique Gomez      Date of Birth: 1956/02/22 MRN: [id number] PCP: Lindell Spar, MD      Visit Date: 03/07/2022   Universal Protocol:     Consent Given By: the patient  Position: PRONE  Additional Comments: Vital signs were monitored before and after the procedure. Patient was prepped and draped in the usual sterile fashion. The correct patient, procedure, and site was verified.   Injection Procedure Details:   Procedure diagnoses: Lumbar radiculopathy [M54.16]   Meds Administered:  Meds ordered this encounter   Medications   methylPREDNISolone acetate (DEPO-MEDROL) injection 40 mg     Laterality: Right  Location/Site:  L4-5  Needle: 3.5 in., 20 ga. Tuohy  Needle Placement: Paramedian epidural  Findings:   -Comments: Excellent flow of contrast into the epidural space.  Procedure Details: Using a paramedian approach from the side mentioned above, the region overlying the inferior lamina was localized under fluoroscopic visualization and the soft tissues overlying this structure were infiltrated with 4 ml. of 1% Lidocaine without Epinephrine. The Tuohy needle was inserted into the epidural space using a paramedian approach.   The epidural space was localized using loss of resistance along with counter oblique bi-planar fluoroscopic views.  After negative aspirate for air, blood, and CSF, a 2 ml. volume of Isovue-250 was injected into the epidural space and the flow of contrast was observed. Radiographs were obtained for documentation purposes.    The injectate was administered into the level noted above.   Additional Comments:  The patient tolerated the procedure well Dressing: 2 x 2 sterile gauze and Band-Aid    Post-procedure details: Patient was observed during the procedure. Post-procedure instructions were reviewed.  Patient left the clinic in stable condition.   Clinical History: Narrative & Impression CLINICAL DATA:  Low back pain; radiates down right leg   EXAM: MRI LUMBAR SPINE WITHOUT CONTRAST   TECHNIQUE: Multiplanar, multisequence MR imaging of the lumbar spine was performed. No intravenous contrast was administered.   COMPARISON:  No prior MRI available   FINDINGS: Segmentation:  Standard.   Alignment:  No  listhesis.  Mild levocurvature.   Vertebrae:  No fracture, evidence of discitis, or bone lesion.   Conus medullaris and cauda equina: Conus extends to the L2 level. Conus and cauda equina appear normal.   Paraspinal and other soft tissues: Negative.    Disc levels:   T12-L1: Seen only on the sagittal images. No significant disc bulge, spinal canal stenosis, or neural foraminal narrowing.   L1-L2: No significant disc bulge. No spinal canal stenosis or neural foraminal narrowing.   L2-L3: No significant disc bulge. Mild facet arthropathy. No spinal canal stenosis or neural foraminal narrowing.   L3-L4: Moderate to severe facet arthropathy. No spinal canal stenosis or neural foraminal narrowing.   L4-L5: No significant disc bulge. Mild-to-moderate facet arthropathy. No spinal canal stenosis or neural foraminal narrowing.   L5-S1: Minimal disc bulge. Mild facet arthropathy. No spinal canal stenosis or neural foraminal narrowing.   IMPRESSION: 1. No spinal canal stenosis or neural foraminal narrowing. 2. Multilevel facet arthropathy, worst at L3-L4, which can be a cause of back pain.     Electronically Signed   By: Merilyn Baba M.D.   On: 02/22/2022 21:17     Objective:  VS:  HT:    WT:   BMI:     BP:116/83  HR:(!) 103bpm  TEMP: ( )  RESP:  Physical Exam Vitals and nursing note reviewed.  Constitutional:      General: She is not in acute distress.    Appearance: Normal appearance. She is not ill-appearing.  HENT:     Head: Normocephalic and atraumatic.     Right Ear: External ear normal.     Left Ear: External ear normal.  Eyes:     Extraocular Movements: Extraocular movements intact.  Cardiovascular:     Rate and Rhythm: Normal rate.     Pulses: Normal pulses.  Pulmonary:     Effort: Pulmonary effort is normal. No respiratory distress.  Abdominal:     General: There is no distension.     Palpations: Abdomen is soft.  Musculoskeletal:        General: Tenderness present.     Cervical back: Neck supple.     Right lower leg: No edema.     Left lower leg: No edema.     Comments: Patient has good distal strength with no pain over the greater trochanters.  No clonus or focal weakness.  Skin:    Findings: No  erythema, lesion or rash.  Neurological:     General: No focal deficit present.     Mental Status: She is alert and oriented to person, place, and time.     Sensory: No sensory deficit.     Motor: No weakness or abnormal muscle tone.     Coordination: Coordination normal.  Psychiatric:        Mood and Affect: Mood normal.        Behavior: Behavior normal.      Imaging: DG Bone Density  Result Date: 03/14/2022 EXAM: DUAL X-RAY ABSORPTIOMETRY (DXA) FOR BONE MINERAL DENSITY IMPRESSION: Your patient Monique Gomez completed a BMD test on 03/14/2022 using the [place] (software version: 14.10) manufactured by [employer]. The following summarizes the results of our evaluation. Technologist: [facility] PATIENT BIOGRAPHICAL: Name: Monique Gomez, Monique Gomez Patient ID: [id number] Birth Date: [birth year] Height: 62.0 in. Gender: Female Exam Date: 03/14/2022 Weight: 274.0 lbs. Indications: Low Calcium Intake, Post Menopausal Fractures: Treatments DENSITOMETRY RESULTS: Site         Region  Measured Date Measured Age WHO Classification Young Adult T-score BMD         %Change vs. Previous Significant Change (*) DualFemur Neck Left 03/14/2022 65.4 Osteopenia -1.8 0.783 g/cm2 - - DualFemur Total Mean 03/14/2022 65.4 Normal -0.9 0.893 g/cm2 - - Left Forearm Radius 33% 03/14/2022 65.4 Normal -0.2 0.699 g/cm2 - - ASSESSMENT: The BMD measured at Femur Neck Left is 0.783 g/cm2 with a T-score of -1.8. This patient is considered osteopenic according to [facility]) criteria. The scan quality is good. Lumbar spine was excluded due to advanced degenerative changes. World Pharmacologist [facility]) criteria for post-menopausal, Caucasian Women: Normal:       T-score at or above -1 SD Osteopenia:   T-score between -1 and -2.5 SD Osteoporosis: T-score at or below -2.5 SD RECOMMENDATIONS: 1. All patients should optimize calcium and vitamin D intake. 2. Consider FDA-approved medical therapies  in postmenopausal women and med aged 19 years and older, based on the following: a. A hip or vertebral (clinical or morphometric) fracture b. T-score< -2.5 at the femoral neck or spine after appropriate evaluation to exclude secondary causes c. Low bone mass (T-score between -1.0 and -2.5 at the femoral neck or spine) and a 10-year probability of a hip fracture > 3% or a 10-year probability of a major osteoporosis-related fracture > 20% based on the [place]-adapted WHO algorithm d. Clinician judgment and/or patient preferences may indicate treatment for people with 10-year fracture probabilities above or below these levels FOLLOW-UP: Patients with diagnosis of osteoporosis or at high risk for fracture should have regular bone mineral density tests. For patients eligible for Medicare, routine testing is allowed once every 2 years. The testing frequency can be increased to one year for patients who have rapidly progressing disease, those who are receiving or discontinuing medical therapy to restore bone mass, or have additional risk factors. I have reviewed this report, and agree with the above findings. Mark A. Thornton Papas, M.D. [facility] Your patient Monique Gomez completed a FRAX assessment on 03/14/2022 using the [place] (analysis version: 14.10) manufactured by [employer]. The following summarizes the results of our evaluation. PATIENT BIOGRAPHICAL: Name: Monique Gomez, Monique Gomez Patient ID: [id number] Birth Date: 1955/05/30 Height:    62.0 in. Gender:     Female    [age]       Weight:    274.0 lbs. Ethnicity:  Black                            Exam Date: 03/14/2022 FRAX* RESULTS:  (version: 3.5) 10-year Probability of Fracture1 Major Osteoporotic Fracture2 Hip Fracture 3.6% 0.4% Population: [place] (Black) Risk Factors: None Based on Femur (Left) Neck BMD 1 -The 10-year probability of fracture may be lower than reported if the patient has received treatment. 2 -Major Osteoporotic  Fracture: Clinical Spine, Forearm, Hip or Shoulder *FRAX is a Materials engineer of the [employer] for Metabolic Bone Disease, a [place] (WHO) [facility]. ASSESSMENT: The probability of a major osteoporotic fracture is 3.6% within the next ten years. The probability of a hip fracture is 0.4% within the next ten years. I have reviewed this report and agree with the above findings. Mark A. Thornton Papas, M.D. [facility] Electronically Signed   By: Lavonia Dana M.D.   On: 03/14/2022 09:57

## 2022-03-15 NOTE — Procedures (Signed)
Lumbar Epidural Steroid Injection - Interlaminar Approach with Fluoroscopic Guidance  Patient: Monique Gomez      Date of Birth: 07-21-1955 MRN: [id number] PCP: Lindell Spar, MD      Visit Date: 03/07/2022   Universal Protocol:     Consent Given By: the patient  Position: PRONE  Additional Comments: Vital signs were monitored before and after the procedure. Patient was prepped and draped in the usual sterile fashion. The correct patient, procedure, and site was verified.   Injection Procedure Details:   Procedure diagnoses: Lumbar radiculopathy [M54.16]   Meds Administered:  Meds ordered this encounter  Medications   methylPREDNISolone acetate (DEPO-MEDROL) injection 40 mg     Laterality: Right  Location/Site:  L4-5  Needle: 3.5 in., 20 ga. Tuohy  Needle Placement: Paramedian epidural  Findings:   -Comments: Excellent flow of contrast into the epidural space.  Procedure Details: Using a paramedian approach from the side mentioned above, the region overlying the inferior lamina was localized under fluoroscopic visualization and the soft tissues overlying this structure were infiltrated with 4 ml. of 1% Lidocaine without Epinephrine. The Tuohy needle was inserted into the epidural space using a paramedian approach.   The epidural space was localized using loss of resistance along with counter oblique bi-planar fluoroscopic views.  After negative aspirate for air, blood, and CSF, a 2 ml. volume of Isovue-250 was injected into the epidural space and the flow of contrast was observed. Radiographs were obtained for documentation purposes.    The injectate was administered into the level noted above.   Additional Comments:  The patient tolerated the procedure well Dressing: 2 x 2 sterile gauze and Band-Aid    Post-procedure details: Patient was observed during the procedure. Post-procedure instructions were reviewed.  Patient left the clinic in stable  condition.

## 2022-03-15 NOTE — ED Provider Notes (Signed)
[facility]    CSN: [id number] Arrival date & time: 03/15/22  1207      History   Chief Complaint Chief Complaint  Patient presents with   Dysuria         HPI Monique Gomez is a 66 y.o. female.   Patient presents for 2-day history of lower abdominal pain, slight dysuria.  She denies urinary frequency or urgency, voiding small amounts, new urinary incontinence, foul urinary odor, hematuria, new back pain, flank pain, fever, body aches, chills, nausea/vomiting, change in appetite, or significant vaginal discharge.  Reports she has taken Azo with mild relief.  Reports she had a UTI about 2 weeks ago which fully improved with the prescribed treatment.  Last urine culture 02/28/2022 does not show specific isolate, no culture and/or sensitivity.    Past Medical History:  Diagnosis Date   Abdominal fibromatosis    Allergic rhinitis    Anemia    Asthma    Essential hypertension    GERD (gastroesophageal reflux disease)    History of cardiac catheterization    No significant CAD May 2015   History of migraine headaches    Hyperlipidemia    Iron deficiency anemia 02/05/2021   Myocarditis ([place])    a. 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   Acquired thrombophilia ([place]) 11/10/2021   Encounter for general adult medical examination with abnormal findings 05/13/2021   Acute pansinusitis 05/13/2021   Iron deficiency anemia 02/05/2021   Arthritis of carpometacarpal (CMC) joint of left thumb 02/04/2021   Arthritis of carpometacarpal (CMC) joint of right thumb 02/04/2021   Lumbar radiculopathy 01/11/2021   Myocarditis ([place])    Vitamin D deficiency 07/18/2019   Empty sella syndrome ([place])  10/24/2018   Hemorrhoids 11/25/2014   Obstructive sleep apnea 10/14/2013   PAF (paroxysmal atrial fibrillation) ([place]) 09/06/2013   NSTEMI (non-ST elevated myocardial infarction) ([place]) 09/03/2013   MDD (major depressive disorder), single episode 02/04/2013   Anxiety state, unspecified 02/04/2013   DM (diabetes mellitus) ([place]) 06/06/2012   Carpal tunnel syndrome of left wrist 12/27/2011   De Quervain's disease (tenosynovitis) 10/09/2011   GERD (gastroesophageal reflux disease) 02/27/2011   Allergic rhinitis 08/29/2010   Anemia 02/15/2010   Hyperlipidemia 12/23/2009   Morbid obesity ([place]) 12/23/2009   Migraine 12/23/2009   HTN (hypertension) 12/23/2009    Past Surgical History:  Procedure Laterality Date   CATARACT EXTRACTION     CATARACT EXTRACTION W/PHACO Right 04/05/2019   Procedure: CATARACT EXTRACTION PHACO AND INTRAOCULAR LENS PLACEMENT RIGHT EYE (CDE: 3.19);  Surgeon: Baruch Goldmann, MD;  Location: [facility];  Service: Ophthalmology;  Laterality: Right;   COLONOSCOPY N/A 02/02/2015   SLF: 1. one colon polyp removed-no source for anemia identified. 2. moderate diverticulosis noted in the sigmoid colon and descending colon 3. the left colon is redundant 4. Rectal bleeding due ot small internal hemorroids 5. Moderate sized external hemorrhoids.   ESOPHAGOGASTRODUODENOSCOPY N/A 02/02/2015   SLF: 1. 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 large hiatal hernia 3. mild non-erosive gastritis 4. No source for anemia identified.    LEFT HEART CATH AND CORONARY ANGIOGRAPHY N/A 05/14/2020   Procedure: LEFT HEART CATH AND CORONARY ANGIOGRAPHY;  Surgeon:  Belva Crome, MD;  Location: [place] [facility];  Service: Cardiovascular;  Laterality: N/A;   LEFT HEART CATHETERIZATION WITH CORONARY ANGIOGRAM N/A 09/03/2013   Procedure: LEFT HEART CATHETERIZATION WITH CORONARY ANGIOGRAM;  Surgeon: Jettie Booze, MD;  Location: [facility];  Service: Cardiovascular;  Laterality: N/A;   Right  carpal tunnel release      OB History     Gravida  4   Para  2   Term      Preterm      AB      Living         SAB      IAB      Ectopic      Multiple      Live Births               Home Medications    Prior to Admission medications   Medication Sig Start Date End Date Taking? Authorizing Provider  sulfamethoxazole-trimethoprim (BACTRIM DS) 800-160 MG tablet Take 1 tablet by mouth 2 (two) times daily for 5 days. 03/15/22 03/20/22 Yes Eulogio Bear, NP  acetaminophen (TYLENOL) 500 MG tablet Take 1 tablet (500 mg total) by mouth every 6 (six) hours as needed. 01/11/20   Avegno, Darrelyn Hillock, FNP  atorvastatin (LIPITOR) 80 MG tablet Take 1 tablet by mouth once daily 06/18/21   Lindell Spar, MD  blood glucose meter kit and supplies Dispense based on patient and insurance preference. Use up to four times daily as directed. (FOR ICD-10 E10.9, E11.9). 11/10/21   Lindell Spar, MD  botulinum toxin Type A (BOTOX) 200 units injection Inject 155 Units into the muscle every 3 (three) months. 12/13/21   Suzzanne Cloud, NP  cyclobenzaprine (FLEXERIL) 10 MG tablet Take 1 tablet (10 mg total) by mouth at bedtime. One tablet every night at bedtime as needed for spasm. 01/20/22   Sanjuana Kava, MD  ELIQUIS 5 MG TABS tablet Take 1 tablet by mouth twice daily 01/04/22   Lindell Spar, MD  fluticasone [facility]) 50 MCG/ACT nasal spray Place 2 sprays into both nostrils daily. 05/13/21   Lindell Spar, MD  Galcanezumab-gnlm [facility]) 120 MG/ML SOAJ INJECT 120 MG  SUBCUTANEOUSLY ONCE EVERY 30 DAYS 11/10/21   Suzzanne Cloud, NP  ipratropium (ATROVENT) 0.03 % nasal spray Place 2 sprays into both nostrils every 12 (twelve) hours. 11/27/19   Alycia Rossetti, MD  Iron-FA-B Cmp-C-Biot-Probiotic (FUSION PLUS) CAPS Take 1 capsule by mouth daily. 03/15/22   Harriett Rush, PA-C  JARDIANCE 10 MG TABS tablet TAKE 1 TABLET BY MOUTH ONCE DAILY BEFORE BREAKFAST 10/06/21   Satira Sark,  MD  meclizine (ANTIVERT) 25 MG tablet Take 1 tablet (25 mg total) by mouth 3 (three) times daily as needed for dizziness. 01/11/21   Lindell Spar, MD  metFORMIN (GLUCOPHAGE) 500 MG tablet TAKE 1 TABLET BY MOUTH TWICE DAILY WITH A MEAL 02/09/22   Lindell Spar, MD  metoCLOPramide (REGLAN) 10 MG tablet Take 1 tablet (10 mg total) by mouth every 8 (eight) hours as needed for up to 7 days for nausea (Headache). 10/13/18   Noemi Chapel, MD  metoprolol succinate (TOPROL-XL) 50 MG 24 hr tablet TAKE 1 TABLET BY MOUTH  IN THE MORNING AND AT BEDTIME. TAKE WITH OR IMMEDIATELY FOLLOWING A MEAL 06/18/21   Satira Sark, MD  pantoprazole (PROTONIX) 40 MG tablet Take 1 tablet by mouth once daily 02/25/22   Lindell Spar,  MD  sacubitril-valsartan (ENTRESTO) 97-103 MG Take 1 tablet by mouth 2 (two) times daily. 01/20/22   Strader, Fransisco Hertz, PA-C  topiramate (TOPAMAX) 50 MG tablet Take 2 tablets (100 mg total) by mouth at bedtime. 11/10/21   Suzzanne Cloud, NP  Ubrogepant (UBRELVY) 50 MG TABS TAKE ONE TABLET BY MOUTH AT THE ONSET OF A MIGRAINE. CAN REPEAT IN 2 HOURS IF NEEDED 11/10/21   Suzzanne Cloud, NP    Family History Family History  Problem Relation Age of Onset   Diabetes Mother    Hypertension Mother    Heart disease Mother    High Cholesterol Mother    Alzheimer's disease Father    Hypertension Sister    Hypertension Brother    Diabetes Brother    Arthritis Sister    Hypertension Sister    High Cholesterol Sister    Diabetes Son    Developmental delay Son    Colon cancer Neg Hx     Social History Social History   Tobacco Use   Smoking status: Former    Packs/day: 0.30    Types: Cigarettes    Quit date: 04/01/2012    Years since quitting: 9.9   Smokeless tobacco: Never  Vaping Use   Vaping Use: Never used  Substance Use Topics   Alcohol use: Not Currently    Alcohol/week: 0.0 standard drinks of alcohol    Comment: very occasional   Drug use: No     Allergies    Venlafaxine   Review of Systems Review of Systems Per HPI  Physical Exam Triage Vital Signs ED Triage Vitals  Enc Vitals Group     BP 03/15/22 1344 130/83     Pulse Rate 03/15/22 1344 100     Resp 03/15/22 1344 18     Temp 03/15/22 1344 98.3 F (36.8 C)     Temp Source 03/15/22 1344 Oral     SpO2 03/15/22 1344 94 %     Weight --      Height --      Head Circumference --      Peak Flow --      Pain Score 03/15/22 1343 0     Pain Loc --      Pain Edu? --      Excl. in [place]? --    No data found.  Updated Vital Signs BP 130/83 (BP Location: Left Wrist)   Pulse 100   Temp 98.3 F (36.8 C) (Oral)   Resp 18   SpO2 94%   Visual Acuity Right Eye Distance:   Left Eye Distance:   Bilateral Distance:    Right Eye Near:   Left Eye Near:    Bilateral Near:     Physical Exam Vitals and nursing note reviewed.  Constitutional:      General: She is not in acute distress.    Appearance: She is obese. She is not toxic-appearing.  Pulmonary:     Effort: Pulmonary effort is normal. No respiratory distress.     Breath sounds: Normal breath sounds. No wheezing, rhonchi or rales.  Abdominal:     General: Abdomen is flat. Bowel sounds are normal. There is no distension.     Palpations: Abdomen is soft. There is no mass.     Tenderness: There is no abdominal tenderness. There is no right CVA tenderness, left CVA tenderness or guarding.  Skin:    General: Skin is warm and dry.     Coloration: Skin is not  jaundiced or pale.     Findings: No erythema.  Neurological:     Mental Status: She is alert and oriented to person, place, and time.     Motor: No weakness.  Psychiatric:        Behavior: Behavior is cooperative.      UC Treatments / Results  Labs (all labs ordered are listed, but only abnormal results are displayed) Labs Reviewed  POCT URINALYSIS DIP (MANUAL ENTRY) - Abnormal; Notable for the following components:      Result Value   Color, UA orange (*)     Glucose, UA >=1,000 (*)    Bilirubin, UA moderate (*)    Ketones, POC UA small (15) (*)    Protein Ur, POC =100 (*)    Urobilinogen, UA 4.0 (*)    Nitrite, UA Positive (*)    Leukocytes, UA Large (3+) (*)    All other components within normal limits  URINE CULTURE    EKG   Radiology  Procedures Procedures (including critical care time)  Medications Ordered in UC Medications - No data to display  Initial Impression / Assessment and Plan / UC Course  I have reviewed the triage vital signs and the nursing notes.  Pertinent labs & imaging results that were available during my care of the patient were reviewed by me and considered in my medical decision making (see chart for details).   Patient is well-appearing, normotensive, afebrile, not tachycardic, not tachypneic, oxygenating well on room air.    Dysuria Urinalysis today unreliable secondary to Azo use Urine culture pending In meantime, will treat with Bactrim DS twice daily for 5 days Increase intake of water, can continue Azo as needed for bladder pain ER and return precautions discussed  The patient was given the opportunity to ask questions.  All questions answered to their satisfaction.  The patient is in agreement to this plan.    Final Clinical Impressions(s) / UC Diagnoses   Final diagnoses:  Dysuria     Discharge Instructions      The urine sample today is inconclusive because of the Azo.  We have sent the urine off for a culture and will call you if this comes back showing we need to change antibiotics in a couple of days.  In the meantime, start Bactrim and take it twice daily for 5 days to treat for a UTI.  You can continue the Azo as needed for bladder pain.  Increase intake of water.  Go to the ER if you develop fever, nausea/vomiting and are unable to keep fluids down, or severe pain.     ED Prescriptions     Medication Sig Dispense Auth. Provider   sulfamethoxazole-trimethoprim (BACTRIM DS)  800-160 MG tablet Take 1 tablet by mouth 2 (two) times daily for 5 days. 10 tablet Eulogio Bear, NP      PDMP not reviewed this encounter.   Eulogio Bear, NP 03/15/22 1420

## 2022-03-15 NOTE — Patient Instructions (Signed)
[place] at [facility] Discharge Instructions  You were seen today by Tarri Abernethy PA-C for your iron deficiency anemia and your lymph node swelling.  IRON DEFICIENCY ANEMIA: Your blood count is normal at this visit.  Your iron level is low, so we will schedule you for IV iron x 3 doses.  Continue to take your iron tablet once daily.  We will recheck your labs again in 6 months.  LYMPH NODE SWELLING: No abnormal lymph nodes were found on exam today.  I suspect that the on-and-off lymph node swelling you experience is secondary to inflammation in your nasal cavity.  If you have any lymph node swelling that does not go away, please let us know.  LABS: Return in 6 months for repeat labs  OTHER TESTS: No other tests at this time  MEDICATIONS: Continue taking iron tablet daily.  FOLLOW-UP APPOINTMENT: Office visit in 6 months, after labs  ** Thank you for trusting me with your healthcare!  I strive to provide all of my patients with quality care at each visit.  If you receive a survey for this visit, I would be so grateful to you for taking the time to provide feedback.  Thank you in advance!  ~ Landis Dowdy                   Dr. Derek Jack   &   Tarri Abernethy, PA-C   - - - - - - - - - - - - - - - - - -     Thank you for choosing [place] at [facility] to provide your oncology and hematology care.  To afford each patient quality time with our provider, please arrive at least 15 minutes before your scheduled appointment time.   If you have a lab appointment with the [place] please come in thru the Main Entrance and check in at the main information desk.  You need to re-schedule your appointment should you arrive 10 or more minutes late.  We strive to give you quality time with our providers, and arriving late affects you and other patients whose appointments are after yours.  Also, if you no show three or more times for  appointments you may be dismissed from the clinic at the providers discretion.     Again, thank you for choosing [facility].  Our hope is that these requests will decrease the amount of time that you wait before being seen by our physicians.       _____________________________________________________________  Should you have questions after your visit to [facility], please contact our office at [phone] and follow the prompts.  Our office hours are 8:00 a.m. and 4:30 p.m. Monday - Friday.  Please note that voicemails left after 4:00 p.m. may not be returned until the following business day.  We are closed weekends and major holidays.  You do have access to a nurse 24-7, just call the main number to the clinic [phone] and do not press any options, hold on the line and a nurse will answer the phone.    For prescription refill requests, have your pharmacy contact our office and allow 72 hours.    Due to Covid, you will need to wear a mask upon entering the hospital. If you do not have a mask, a mask will be given to you at the Main Entrance upon arrival. For doctor visits, patients may have  1 support person age 3 or older with them. For treatment visits, patients can not have anyone with them due to social distancing guidelines and our immunocompromised population.

## 2022-03-15 NOTE — Discharge Instructions (Signed)
The urine sample today is inconclusive because of the Azo.  We have sent the urine off for a culture and will call you if this comes back showing we need to change antibiotics in a couple of days.  In the meantime, start Bactrim and take it twice daily for 5 days to treat for a UTI.  You can continue the Azo as needed for bladder pain.  Increase intake of water.  Go to the ER if you develop fever, nausea/vomiting and are unable to keep fluids down, or severe pain.

## 2022-03-15 NOTE — ED Triage Notes (Signed)
Pt reports burning when urinating and increase urinary frequency x 2 days. AZO gives some relief.

## 2022-03-18 LAB — URINE CULTURE: Culture: 30000 — AB

## 2022-03-23 ENCOUNTER — Ambulatory Visit (INDEPENDENT_AMBULATORY_CARE_PROVIDER_SITE_OTHER): Payer: Medicare HMO | Admitting: Neurology

## 2022-03-23 VITALS — BP 138/85 | HR 93

## 2022-03-23 DIAGNOSIS — G43709 Chronic migraine without aura, not intractable, without status migrainosus: Secondary | ICD-10-CM | POA: Diagnosis not present

## 2022-03-23 MED ORDER — ONABOTULINUMTOXINA 200 UNITS IJ SOLR
155.0000 [IU] | Freq: Once | INTRAMUSCULAR | Status: AC
  Administered 2022-03-23: 155 [IU] via INTRAMUSCULAR

## 2022-03-23 NOTE — Progress Notes (Signed)
   BOTOX PROCEDURE NOTE FOR MIGRAINE HEADACHE   HISTORY: Monique Gomez is here for her 2nd Botox injection for chronic migraine headaches. Reports 80% improvement with her migraines with Botox. Remains on Emgality on Topamax. Remains on Eliquis, she didn't have any problems with bleeding. Hasn't needed the [name]!  Description of procedure:  The patient was placed in a sitting position. The standard protocol was used for Botox as follows, with 5 units of Botox injected at each site:   -Procerus muscle, midline injection  -Corrugator muscle, bilateral injection  -Frontalis muscle, bilateral injection, with 2 sites each side, medial injection was performed in the upper one third of the frontalis muscle, in the region vertical from the medial inferior edge of the superior orbital rim. The lateral injection was again in the upper one third of the forehead vertically above the lateral limbus of the cornea, 1.5 cm lateral to the medial injection site.  -Temporalis muscle injection, 4 sites, bilaterally. The first injection was 3 cm above the tragus of the ear, second injection site was 1.5 cm to 3 cm up from the first injection site in line with the tragus of the ear. The third injection site was 1.5-3 cm forward between the first 2 injection sites. The fourth injection site was 1.5 cm posterior to the second injection site.  -Occipitalis muscle injection, 3 sites, bilaterally. The first injection was done one half way between the occipital protuberance and the tip of the mastoid process behind the ear. The second injection site was done lateral and superior to the first, 1 fingerbreadth from the first injection. The third injection site was 1 fingerbreadth superiorly and medially from the first injection site.  -Cervical paraspinal muscle injection, 2 sites, bilateral, the first injection site was 1 cm from the midline of the cervical spine, 3 cm inferior to the lower border of the occipital  protuberance. The second injection site was 1.5 cm superiorly and laterally to the first injection site.  -Trapezius muscle injection was performed at 3 sites, bilaterally. The first injection site was in the upper trapezius muscle halfway between the inflection point of the neck, and the acromion. The second injection site was one half way between the acromion and the first injection site. The third injection was done between the first injection site and the inflection point of the neck.   A 200 unit bottle of Botox was used, 155 units were injected, the rest of the Botox was wasted. The patient tolerated the procedure well, there were no complications of the above procedure.  Botox NDC 6578-4696-29 Lot number [id number] Expiration date 0282026 SP

## 2022-03-23 NOTE — Progress Notes (Signed)
Botox- 200 units x 1 vial Lot: [id number] Expiration: 06/2024 NDC: 1031-2811-88  Bacteriostatic 0.9% Sodium Chloride- 13m total Lot: [id number]: 01/01/2023 NDC: [address]-07 Dx: GA15.183S/P

## 2022-03-29 ENCOUNTER — Encounter: Payer: Self-pay | Admitting: Orthopaedic Surgery

## 2022-03-29 ENCOUNTER — Ambulatory Visit: Payer: Medicare HMO | Admitting: Orthopaedic Surgery

## 2022-03-29 VITALS — BP 114/74 | HR 85

## 2022-03-29 DIAGNOSIS — M79604 Pain in right leg: Secondary | ICD-10-CM | POA: Diagnosis not present

## 2022-03-29 DIAGNOSIS — M545 Low back pain, unspecified: Secondary | ICD-10-CM

## 2022-03-29 MED ORDER — CYCLOBENZAPRINE HCL 10 MG PO TABS: ORAL_TABLET | ORAL | 2 refills | Status: AC

## 2022-03-29 MED ORDER — HYDROCODONE-ACETAMINOPHEN 5-325 MG PO TABS: ORAL_TABLET | ORAL | 0 refills | Status: DC

## 2022-03-29 NOTE — Patient Instructions (Signed)
YOU HAVE BEEN PRESCRIBED A NARCOTIC FOR PAIN RELIEF. THIS MEDICATION WILL CAUSE DROWSINESS. DO NOT DRIVE OR OPERATE MACHINERY WHILE TAKING THIS MEDICATION UNTIL YOU GET USED TO IT.

## 2022-03-29 NOTE — Progress Notes (Signed)
My back is hurting more.  She went on the cruise after having the injection from Dr. Ernestina Patches.  The injection only helped a few days.  She used a wheelchair on the ship and did OK but she had pain.  She has pain now and having bad days.  She needs refill on Flexeril.  She would like to have a narcotic for the bad days.  She has no weakness, no new trauma.  Lower back is tender, no spasm, ROM limited, gait slow, NV intact, no weakness.  Encounter Diagnosis  Name Primary?   Lumbar pain with radiation down right leg Yes   I will refill Flexeril and begin pain medicine.  I have reviewed the [place] web site prior to prescribing narcotic medicine for this patient.  Return in six weeks.  Contact Dr. Ernestina Patches about possible new epidural.  Call if any problem.  Precautions discussed.  Electronically Signed Sanjuana Kava, MD 11/28/20238:27 AM

## 2022-03-30 ENCOUNTER — Telehealth: Payer: Self-pay | Admitting: Physical Medicine and Rehabilitation

## 2022-03-30 ENCOUNTER — Ambulatory Visit: Payer: Medicare HMO | Admitting: Neurology

## 2022-03-30 NOTE — Telephone Encounter (Signed)
Pt called requesting a call back to set a back injection appt. Please call pt at [phone]

## 2022-03-31 ENCOUNTER — Ambulatory Visit (INDEPENDENT_AMBULATORY_CARE_PROVIDER_SITE_OTHER): Payer: Medicare HMO | Admitting: Internal Medicine

## 2022-03-31 ENCOUNTER — Encounter: Payer: Self-pay | Admitting: Internal Medicine

## 2022-03-31 ENCOUNTER — Other Ambulatory Visit: Payer: Self-pay | Admitting: Physical Medicine and Rehabilitation

## 2022-03-31 DIAGNOSIS — N3000 Acute cystitis without hematuria: Secondary | ICD-10-CM

## 2022-03-31 DIAGNOSIS — R300 Dysuria: Secondary | ICD-10-CM | POA: Diagnosis not present

## 2022-03-31 DIAGNOSIS — G8929 Other chronic pain: Secondary | ICD-10-CM

## 2022-03-31 DIAGNOSIS — M7918 Myalgia, other site: Secondary | ICD-10-CM

## 2022-03-31 LAB — POCT URINALYSIS DIP (CLINITEK)
Bilirubin, UA: NEGATIVE
Blood, UA: NEGATIVE
Glucose, UA: >=1000 mg/dL — AB
Ketones, POC UA: NEGATIVE mg/dL
Leukocytes, UA: NEGATIVE
Nitrite, UA: NEGATIVE
POC PROTEIN,UA: NEGATIVE
Spec Grav, UA: 1.02 (ref 1.010–1.025)
Urobilinogen, UA: 0.2 E.U./dL
pH, UA: 6 (ref 5.0–8.0)

## 2022-03-31 MED ORDER — NITROFURANTOIN MONOHYD MACRO 100 MG PO CAPS: 100.0000 mg | ORAL_CAPSULE | Freq: Two times a day (BID) | ORAL | 0 refills | Status: AC

## 2022-03-31 NOTE — Progress Notes (Signed)
   Acute Telephone Visit  Virtual Visit via Telephone Note  I connected with Monique Gomez on 03/31/22 at 11:40 AM EST by telephone and verified that I am speaking with the correct person using two identifiers.  Chief Complaint  Patient presents with   Urinary Tract Infection    Pain and pressure   Location: Patient: [address] [facility] [place]ique Gomez, [place] [address] Provider: [address], [place] [address]   I discussed the limitations, risks, security and privacy concerns of performing an evaluation and management service by telephone and the availability of in person appointments. I also discussed with the patient that there may be a patient responsible charge related to this service. The patient expressed understanding and agreed to proceed.   History of Present Illness:  Monique Gomez is evaluated today for symptoms of pelvic pain / pressure that has recurred for a third time in the last month. She has previously been evaluated at the emergency department and treated for UTI with keflex in late October, and then  [facility] on 11/14. She described a pressure-like sensation in her pelvis that is worse with urination. She denies systemic symptoms of fever/chills and nausea/vomiting. Her pain has previously improved with antibiotics. She is unsure why her symptoms continue to recur. Monique Gomez has been drinking water and cranberry juice. She previously took Azo x 2 days within the last 2 weeks.   Assessment and Plan:  Dysuria /  Pelvic Pain She endorses pelvic pressure / pain that is worse with urination and has previously improved with antibiotic treatment. She has been treated for UTI twice within the last month (keflex & Bactrim). UA today is not consistent with infection, however the accuracy of this result is questionable given recent antibiotic and Azo use. -Given recurrent symptoms, we will send her urine for culture today as she is at an increased risk for resistant infection in the  setting of recent antibiotic use. -I recommend that she stop taking Jaridance for now due to recurrent UTI -Follow up prn. We will monitor urine culture results   Follow Up Instructions:  I discussed the assessment and treatment plan with the patient. The patient was provided an opportunity to ask questions and all were answered. The patient agreed with the plan and demonstrated an understanding of the instructions.   The patient was advised to call back or seek an in-person evaluation if the symptoms worsen or if the condition fails to improve as anticipated.  I provided 13 minutes of non-face-to-face time during this encounter.   Johnette Abraham, MD

## 2022-03-31 NOTE — Telephone Encounter (Signed)
Spoke with patient and she stated she is having the same pain in the same location. She has not had any new injuries, falls or accidents. She walked a lot on her cruise and it flared up again. Please advise

## 2022-03-31 NOTE — Addendum Note (Signed)
Addended by: Johny Drilling on: 03/31/2022 01:08 PM   Modules accepted: Orders

## 2022-04-01 DIAGNOSIS — R300 Dysuria: Secondary | ICD-10-CM | POA: Diagnosis not present

## 2022-04-05 ENCOUNTER — Ambulatory Visit (INDEPENDENT_AMBULATORY_CARE_PROVIDER_SITE_OTHER): Payer: Medicare HMO | Admitting: Obstetrics & Gynecology

## 2022-04-05 ENCOUNTER — Other Ambulatory Visit (HOSPITAL_COMMUNITY)
Admission: RE | Admit: 2022-04-05 | Discharge: 2022-04-05 | Disposition: A | Discharge status: Home / Self Care | Payer: Medicare HMO | Source: Ambulatory Visit | Attending: Obstetrics & Gynecology | Admitting: Obstetrics & Gynecology

## 2022-04-05 ENCOUNTER — Other Ambulatory Visit: Payer: Self-pay

## 2022-04-05 ENCOUNTER — Encounter: Payer: Self-pay | Admitting: Obstetrics & Gynecology

## 2022-04-05 VITALS — BP 109/77 | HR 99 | Ht 62.0 in | Wt 269.0 lb

## 2022-04-05 DIAGNOSIS — Z01419 Encounter for gynecological examination (general) (routine) without abnormal findings: Secondary | ICD-10-CM

## 2022-04-05 DIAGNOSIS — Z1151 Encounter for screening for human papillomavirus (HPV): Secondary | ICD-10-CM | POA: Insufficient documentation

## 2022-04-05 DIAGNOSIS — N3000 Acute cystitis without hematuria: Secondary | ICD-10-CM

## 2022-04-05 NOTE — Progress Notes (Signed)
Subjective:     Monique Gomez is a 66 y.o. female here for a routine exam.  No LMP recorded. Patient is postmenopausal. G4P2 Birth Control Method:  menopausal Menstrual Calendar(currently): amenorrheic  Current complaints: none.   Current acute medical issues:     Recent Gynecologic History No LMP recorded. Patient is postmenopausal. Last Pap: 06/2019,  normal Last mammogram: 03/14/22,  normal  Past Medical History:  Diagnosis Date   Abdominal fibromatosis    Allergic rhinitis    Anemia    Asthma    Essential hypertension    GERD (gastroesophageal reflux disease)    History of cardiac catheterization    No significant CAD May 2015   History of migraine headaches    Hyperlipidemia    Iron deficiency anemia 02/05/2021   Myocarditis ([place])    a. diagnosed in 05/2020 with cath showing normal cors   Obesity    PAF (paroxysmal atrial fibrillation) ([place]) 08/2013   Sleep apnea    Type 2 diabetes mellitus ([place])     Past Surgical History:  Procedure Laterality Date   CATARACT EXTRACTION     CATARACT EXTRACTION W/PHACO Right 04/05/2019   Procedure: CATARACT EXTRACTION PHACO AND INTRAOCULAR LENS PLACEMENT RIGHT EYE (CDE: 3.19);  Surgeon: Baruch Goldmann, MD;  Location: [facility];  Service: Ophthalmology;  Laterality: Right;   COLONOSCOPY N/A 02/02/2015   SLF: 1. one colon polyp removed-no source for anemia identified. 2. moderate diverticulosis noted in the sigmoid colon and descending colon 3. the left colon is redundant 4. Rectal bleeding due ot small internal hemorroids 5. Moderate sized external hemorrhoids.   ESOPHAGOGASTRODUODENOSCOPY N/A 02/02/2015   SLF: 1. 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 large hiatal hernia 3. mild non-erosive gastritis 4. No source for anemia identified.    LEFT HEART CATH AND CORONARY ANGIOGRAPHY N/A 05/14/2020   Procedure: LEFT HEART CATH AND CORONARY ANGIOGRAPHY;  Surgeon: Belva Crome, MD;  Location: [place] [facility];  Service:  Cardiovascular;  Laterality: N/A;   LEFT HEART CATHETERIZATION WITH CORONARY ANGIOGRAM N/A 09/03/2013   Procedure: LEFT HEART CATHETERIZATION WITH CORONARY ANGIOGRAM;  Surgeon: Jettie Booze, MD;  Location: [facility] CATH LAB;  Service: Cardiovascular;  Laterality: N/A;   Right carpal tunnel release      OB History     Gravida  4   Para  2   Term      Preterm      AB      Living         SAB      IAB      Ectopic      Multiple      Live Births              Social History   Socioeconomic History   Marital status: Single    Spouse name: Not on file   Number of children: 2   Years of education: Not on file   Highest education level: Not on file  Occupational History   Occupation: Education officer, museum    Employer: [employer]  Tobacco Use   Smoking status: Former    Packs/day: 0.30    Types: Cigarettes    Quit date: 04/01/2012    Years since quitting: 10.0   Smokeless tobacco: Never  Vaping Use   Vaping Use: Never used  Substance and Sexual Activity   Alcohol use: Not Currently    Alcohol/week: 0.0 standard drinks of alcohol    Comment: very occasional  Drug use: No   Sexual activity: Not Currently    Birth control/protection: Post-menopausal  Other Topics Concern   Not on file  Social History Narrative   Not on file   Social Determinants of Health   Financial Resource Strain: Low Risk  (04/05/2022)   Overall Financial Resource Strain (CARDIA)    Difficulty of Paying Living Expenses: Not hard at all  Food Insecurity: No Food Insecurity (04/05/2022)   Hunger Vital Sign    Worried About Running Out of Food in the Last Year: Never true    Ran Out of Food in the Last Year: Never true  Transportation Needs: No Transportation Needs (04/05/2022)   PRAPARE - Hydrologist (Medical): No    Lack of Transportation (Non-Medical): No  Physical Activity: Insufficiently Active (04/05/2022)   Exercise Vital Sign    Days of Exercise per  Week: 2 days    Minutes of Exercise per Session: 10 min  Stress: No Stress Concern Present (04/05/2022)   [place]    Feeling of Stress : Not at all  Social Connections: Moderately Isolated (04/05/2022)   Social Connection and Isolation Panel [NHANES]    Frequency of Communication with Friends and Family: More than three times a week    Frequency of Social Gatherings with Friends and Family: More than three times a week    Attends Religious Services: 1 to 4 times per year    Active Member of [employer] or Organizations: No    Attends Music therapist: Never    Marital Status: Never married    Family History  Problem Relation Age of Onset   Diabetes Mother    Hypertension Mother    Heart disease Mother    High Cholesterol Mother    Alzheimer's disease Father    Hypertension Sister    Hypertension Brother    Diabetes Brother    Arthritis Sister    Hypertension Sister    High Cholesterol Sister    Diabetes Son    Developmental delay Son    Colon cancer Neg Hx      Current Outpatient Medications:    acetaminophen (TYLENOL) 500 MG tablet, Take 1 tablet (500 mg total) by mouth every 6 (six) hours as needed., Disp: 30 tablet, Rfl: 0   atorvastatin (LIPITOR) 80 MG tablet, Take 1 tablet by mouth once daily, Disp: 90 tablet, Rfl: 3   blood glucose meter kit and supplies, Dispense based on patient and insurance preference. Use up to four times daily as directed. (FOR ICD-10 E10.9, E11.9)., Disp: 1 each, Rfl: 0   botulinum toxin Type A (BOTOX) 200 units injection, Inject 155 Units into the muscle every 3 (three) months., Disp: 1 each, Rfl: 2   cyclobenzaprine (FLEXERIL) 10 MG tablet, One tablet every twelve hours as needed for spasm., Disp: 60 tablet, Rfl: 2   ELIQUIS 5 MG TABS tablet, Take 1 tablet by mouth twice daily, Disp: 180 tablet, Rfl: 0   fluticasone (FLONASE) 50 MCG/ACT nasal spray, Place 2 sprays into  both nostrils daily., Disp: 16 g, Rfl: 5   Galcanezumab-gnlm (EMGALITY) 120 MG/ML SOAJ, INJECT 120 MG  SUBCUTANEOUSLY ONCE EVERY 30 DAYS, Disp: 1 mL, Rfl: 11   HYDROcodone-acetaminophen (NORCO/VICODIN) 5-325 MG tablet, One tablet every four hours as needed for acute pain.  Limit of five days per [place] statue., Disp: 30 tablet, Rfl: 0   ipratropium (ATROVENT) 0.03 % nasal spray, Place  2 sprays into both nostrils every 12 (twelve) hours., Disp: 30 mL, Rfl: 3   Iron-FA-B Cmp-C-Biot-Probiotic (FUSION PLUS) CAPS, Take 1 capsule by mouth daily., Disp: 30 capsule, Rfl: 11   JARDIANCE 10 MG TABS tablet, TAKE 1 TABLET BY MOUTH ONCE DAILY BEFORE BREAKFAST, Disp: 90 tablet, Rfl: 3   meclizine (ANTIVERT) 25 MG tablet, Take 1 tablet (25 mg total) by mouth 3 (three) times daily as needed for dizziness., Disp: 30 tablet, Rfl: 0   metFORMIN (GLUCOPHAGE) 500 MG tablet, TAKE 1 TABLET BY MOUTH TWICE DAILY WITH A MEAL, Disp: 180 tablet, Rfl: 0   metoCLOPramide (REGLAN) 10 MG tablet, Take 1 tablet (10 mg total) by mouth every 8 (eight) hours as needed for up to 7 days for nausea (Headache)., Disp: 21 tablet, Rfl: 0   metoprolol succinate (TOPROL-XL) 50 MG 24 hr tablet, TAKE 1 TABLET BY MOUTH  IN THE MORNING AND AT BEDTIME. TAKE WITH OR IMMEDIATELY FOLLOWING A MEAL, Disp: 180 tablet, Rfl: 3   pantoprazole (PROTONIX) 40 MG tablet, Take 1 tablet by mouth once daily, Disp: 90 tablet, Rfl: 0   sacubitril-valsartan (ENTRESTO) 97-103 MG, Take 1 tablet by mouth 2 (two) times daily., Disp: 180 tablet, Rfl: 3   topiramate (TOPAMAX) 50 MG tablet, Take 2 tablets (100 mg total) by mouth at bedtime., Disp: 180 tablet, Rfl: 3   Ubrogepant (UBRELVY) 50 MG TABS, TAKE ONE TABLET BY MOUTH AT THE ONSET OF A MIGRAINE. CAN REPEAT IN 2 HOURS IF NEEDED, Disp: 10 tablet, Rfl: 11   nitrofurantoin, macrocrystal-monohydrate, (MACROBID) 100 MG capsule, Take 1 capsule (100 mg total) by mouth 2 (two) times daily for 5 days. (Patient not taking:  Reported on 04/05/2022), Disp: 10 capsule, Rfl: 0  Review of Systems  Review of Systems  Constitutional: Negative for fever, chills, weight loss, malaise/fatigue and diaphoresis.  HENT: Negative for hearing loss, ear pain, nosebleeds, congestion, sore throat, neck pain, tinnitus and ear discharge.   Eyes: Negative for blurred vision, double vision, photophobia, pain, discharge and redness.  Respiratory: Negative for cough, hemoptysis, sputum production, shortness of breath, wheezing and stridor.   Cardiovascular: Negative for chest pain, palpitations, orthopnea, claudication, leg swelling and PND.  Gastrointestinal: negative for abdominal pain. Negative for heartburn, nausea, vomiting, diarrhea, constipation, blood in stool and melena.  Genitourinary: Negative for dysuria, urgency, frequency, hematuria and flank pain.  Musculoskeletal: Negative for myalgias, back pain, joint pain and falls.  Skin: Negative for itching and rash.  Neurological: Negative for dizziness, tingling, tremors, sensory change, speech change, focal weakness, seizures, loss of consciousness, weakness and headaches.  Endo/Heme/Allergies: Negative for environmental allergies and polydipsia. Does not bruise/bleed easily.  Psychiatric/Behavioral: Negative for depression, suicidal ideas, hallucinations, memory loss and substance abuse. The patient is not nervous/anxious and does not have insomnia.        Objective:  Blood pressure 109/77, pulse 99, height 5' 2" (1.575 m), weight 269 lb (122 kg).   Physical Exam  Vitals reviewed. Constitutional: She is oriented to person, place, and time. She appears well-developed and well-nourished.  HENT:  Head: Normocephalic and atraumatic.        Right Ear: External ear normal.  Left Ear: External ear normal.  Nose: Nose normal.  Mouth/Throat: Oropharynx is clear and moist.  Eyes: Conjunctivae and EOM are normal. Pupils are equal, round, and reactive to light. Right eye exhibits no  discharge. Left eye exhibits no discharge. No scleral icterus.  Neck: Normal range of motion. Neck supple. No tracheal deviation present.  No thyromegaly present.  Cardiovascular: Normal rate, regular rhythm, normal heart sounds and intact distal pulses.  Exam reveals no gallop and no friction rub.   No murmur heard. Respiratory: Effort normal and breath sounds normal. No respiratory distress. She has no wheezes. She has no rales. She exhibits no tenderness.  GI: Soft. Bowel sounds are normal. She exhibits no distension and no mass. There is no tenderness. There is no rebound and no guarding.  Genitourinary:  Breasts no masses skin changes or nipple changes bilaterally      Vulva is normal without lesions Vagina is pink moist without discharge Cervix normal in appearance and pap is done Uterus is normal size shape and contour Adnexa is negative with normal sized ovaries   Musculoskeletal: Normal range of motion. She exhibits no edema and no tenderness.  Neurological: She is alert and oriented to person, place, and time. She has normal reflexes. She displays normal reflexes. No cranial nerve deficit. She exhibits normal muscle tone. Coordination normal.  Skin: Skin is warm and dry. No rash noted. No erythema. No pallor.  Psychiatric: She has a normal mood and affect. Her behavior is normal. Judgment and thought content normal.       Medications Ordered at today's visit: No orders of the defined types were placed in this encounter.   Other orders placed at today's visit: No orders of the defined types were placed in this encounter.     Assessment:    Normal Gyn exam.   Recent dysuria symptoms with 1 low colony count +urine culture Plan:    Hormone replacement therapy: not applicable. Follow up in: 3 years.   If continues the dysuria with another negative culture may be having more   OAB type issues  No follow-ups on file.

## 2022-04-06 LAB — URINE CULTURE

## 2022-04-07 LAB — CYTOLOGY - PAP
Chlamydia: NEGATIVE
Comment: NEGATIVE
Comment: NEGATIVE
Comment: NEGATIVE
Comment: NORMAL
Diagnosis: NEGATIVE
High risk HPV: NEGATIVE
Neisseria Gonorrhea: NEGATIVE
Trichomonas: NEGATIVE

## 2022-04-08 ENCOUNTER — Inpatient Hospital Stay: Payer: Medicare HMO

## 2022-04-15 ENCOUNTER — Inpatient Hospital Stay: Payer: Medicare HMO

## 2022-04-17 ENCOUNTER — Other Ambulatory Visit: Payer: Self-pay | Admitting: Internal Medicine

## 2022-04-22 ENCOUNTER — Inpatient Hospital Stay: Payer: Medicare HMO | Attending: Hematology

## 2022-04-22 VITALS — BP 111/79 | HR 85 | Temp 97.5°F | Resp 18

## 2022-04-22 DIAGNOSIS — D509 Iron deficiency anemia, unspecified: Secondary | ICD-10-CM | POA: Diagnosis not present

## 2022-04-22 DIAGNOSIS — D508 Other iron deficiency anemias: Secondary | ICD-10-CM

## 2022-04-22 DIAGNOSIS — R590 Localized enlarged lymph nodes: Secondary | ICD-10-CM | POA: Diagnosis not present

## 2022-04-22 MED ORDER — SODIUM CHLORIDE 0.9 % IV SOLN: Freq: Once | INTRAVENOUS | Status: AC

## 2022-04-22 MED ORDER — SODIUM CHLORIDE 0.9 % IV SOLN
300.0000 mg | Freq: Once | INTRAVENOUS | Status: AC
  Administered 2022-04-22: 300 mg via INTRAVENOUS
  Filled 2022-04-22: qty 300

## 2022-04-22 NOTE — Patient Instructions (Signed)
[facility]  Discharge Instructions: Thank you for choosing [facility] to provide your oncology and hematology care.  If you have a lab appointment with the Cancer Center, please come in thru the Main Entrance and check in at the main information desk.  Wear comfortable clothing and clothing appropriate for easy access to any Portacath or PICC line.   We strive to give you quality time with your provider. You may need to reschedule your appointment if you arrive late (15 or more minutes).  Arriving late affects you and other patients whose appointments are after yours.  Also, if you miss three or more appointments without notifying the office, you may be dismissed from the clinic at the provider's discretion.      For prescription refill requests, have your pharmacy contact our office and allow 72 hours for refills to be completed.    Today you received Venofer IV iron infusion.     BELOW ARE SYMPTOMS THAT SHOULD BE REPORTED IMMEDIATELY: *FEVER GREATER THAN 100.4 F (38 C) OR HIGHER *CHILLS OR SWEATING *NAUSEA AND VOMITING THAT IS NOT CONTROLLED WITH YOUR NAUSEA MEDICATION *UNUSUAL SHORTNESS OF BREATH *UNUSUAL BRUISING OR BLEEDING *URINARY PROBLEMS (pain or burning when urinating, or frequent urination) *BOWEL PROBLEMS (unusual diarrhea, constipation, pain near the anus) TENDERNESS IN MOUTH AND THROAT WITH OR WITHOUT PRESENCE OF ULCERS (sore throat, sores in mouth, or a toothache) UNUSUAL RASH, SWELLING OR PAIN  UNUSUAL VAGINAL DISCHARGE OR ITCHING   Items with * indicate a potential emergency and should be followed up as soon as possible or go to the Emergency Department if any problems should occur.  Please show the CHEMOTHERAPY ALERT CARD or IMMUNOTHERAPY ALERT CARD at check-in to the Emergency Department and triage nurse.  Should you have questions after your visit or need to cancel or reschedule your appointment, please contact [facility] [phone]  and follow the prompts.  Office hours are 8:00 a.m. to 4:30 p.m. Monday - Friday. Please note that voicemails left after 4:00 p.m. may not be returned until the following business day.  We are closed weekends and major holidays. You have access to a nurse at all times for urgent questions. Please call the main number to the clinic [phone] and follow the prompts.  For any non-urgent questions, you may also contact your provider using MyChart. We now offer e-Visits for anyone 18 and older to request care online for non-urgent symptoms. For details visit [email or web address].   Also download the MyChart app! Go to the app store, search "MyChart", open the app, select [place] [facility], and log in with your MyChart username and password.  Masks are optional in the cancer centers. If you would like for your care team to wear a mask while they are taking care of you, please let them know. You may have one support person who is at least 65 years old accompany you for your appointments.  

## 2022-04-22 NOTE — Progress Notes (Signed)
Pt present today for Venofer IV iron infusion per provider's order. Vital signs stable and pt voiced no new complaints at this time.   Peripheral IV started with good blood return pre and post infusion.  Venofer 300 mg given today per MD orders. Tolerated infusion without adverse affects. Vital signs stable. No complaints at this time. Discharged from clinic ambulatory in stable condition. Alert and oriented x 3. F/U with [facility] as scheduled.

## 2022-04-22 NOTE — Progress Notes (Signed)
Pt took her pre-meds at home today.

## 2022-04-29 ENCOUNTER — Inpatient Hospital Stay: Payer: Medicare HMO

## 2022-04-29 VITALS — BP 119/93 | HR 85 | Temp 97.3°F | Resp 18

## 2022-04-29 DIAGNOSIS — D509 Iron deficiency anemia, unspecified: Secondary | ICD-10-CM | POA: Diagnosis not present

## 2022-04-29 DIAGNOSIS — D508 Other iron deficiency anemias: Secondary | ICD-10-CM

## 2022-04-29 DIAGNOSIS — R590 Localized enlarged lymph nodes: Secondary | ICD-10-CM | POA: Diagnosis not present

## 2022-04-29 MED ORDER — SODIUM CHLORIDE 0.9 % IV SOLN
300.0000 mg | Freq: Once | INTRAVENOUS | Status: AC
  Administered 2022-04-29: 300 mg via INTRAVENOUS
  Filled 2022-04-29: qty 300

## 2022-04-29 MED ORDER — SODIUM CHLORIDE 0.9 % IV SOLN: Freq: Once | INTRAVENOUS | Status: AC

## 2022-04-29 NOTE — Progress Notes (Signed)
Patient presents today for Venofer infusion per providers order.  Vitals signs after recheck within parameters, BP elevated at 135/99 and patient just took her BP medication. Patient has no new complaints at this time.  Peripheral IV started and blood return noted pre and post infusion.    Stable during infusion without adverse affects.  Vital signs stable.  No complaints at this time.  Discharge from clinic ambulatory in stable condition.  Alert and oriented X 3.  Follow up with [facility] as scheduled.

## 2022-04-29 NOTE — Patient Instructions (Signed)
[facility] [place]  Discharge Instructions: Thank you for choosing [facility] to provide your oncology and hematology care.  If you have a lab appointment with the Cancer Center, please come in thru the Main Entrance and check in at the main information desk.  Wear comfortable clothing and clothing appropriate for easy access to any Portacath or PICC line.   We strive to give you quality time with your provider. You may need to reschedule your appointment if you arrive late (15 or more minutes).  Arriving late affects you and other patients whose appointments are after yours.  Also, if you miss three or more appointments without notifying the office, you may be dismissed from the clinic at the provider's discretion.      For prescription refill requests, have your pharmacy contact our office and allow 72 hours for refills to be completed.    Today you received the following chemotherapy and/or immunotherapy agents Venofer      To help prevent nausea and vomiting after your treatment, we encourage you to take your nausea medication as directed.  BELOW ARE SYMPTOMS THAT SHOULD BE REPORTED IMMEDIATELY: *FEVER GREATER THAN 100.4 F (38 C) OR HIGHER *CHILLS OR SWEATING *NAUSEA AND VOMITING THAT IS NOT CONTROLLED WITH YOUR NAUSEA MEDICATION *UNUSUAL SHORTNESS OF BREATH *UNUSUAL BRUISING OR BLEEDING *URINARY PROBLEMS (pain or burning when urinating, or frequent urination) *BOWEL PROBLEMS (unusual diarrhea, constipation, pain near the anus) TENDERNESS IN MOUTH AND THROAT WITH OR WITHOUT PRESENCE OF ULCERS (sore throat, sores in mouth, or a toothache) UNUSUAL RASH, SWELLING OR PAIN  UNUSUAL VAGINAL DISCHARGE OR ITCHING   Items with * indicate a potential emergency and should be followed up as soon as possible or go to the Emergency Department if any problems should occur.  Please show the CHEMOTHERAPY ALERT CARD or IMMUNOTHERAPY ALERT CARD at check-in to the Emergency  Department and triage nurse.  Should you have questions after your visit or need to cancel or reschedule your appointment, please contact [facility] [phone]  and follow the prompts.  Office hours are 8:00 a.m. to 4:30 p.m. Monday - Friday. Please note that voicemails left after 4:00 p.m. may not be returned until the following business day.  We are closed weekends and major holidays. You have access to a nurse at all times for urgent questions. Please call the main number to the clinic [phone] and follow the prompts.  For any non-urgent questions, you may also contact your provider using MyChart. We now offer e-Visits for anyone 18 and older to request care online for non-urgent symptoms. For details visit [email or web address].   Also download the MyChart app! Go to the app store, search "MyChart", open the app, select , and log in with your MyChart username and password.   

## 2022-05-03 ENCOUNTER — Ambulatory Visit (HOSPITAL_COMMUNITY): Payer: Medicare HMO | Admitting: Physical Therapy

## 2022-05-04 ENCOUNTER — Ambulatory Visit: Payer: 59 | Admitting: Physician Assistant

## 2022-05-05 ENCOUNTER — Ambulatory Visit (INDEPENDENT_AMBULATORY_CARE_PROVIDER_SITE_OTHER): Payer: Medicare HMO | Admitting: Urology

## 2022-05-05 ENCOUNTER — Encounter: Payer: Self-pay | Admitting: Urology

## 2022-05-05 VITALS — BP 107/77 | HR 98

## 2022-05-05 DIAGNOSIS — Z87440 Personal history of urinary (tract) infections: Secondary | ICD-10-CM

## 2022-05-05 DIAGNOSIS — R1031 Right lower quadrant pain: Secondary | ICD-10-CM | POA: Diagnosis not present

## 2022-05-05 DIAGNOSIS — R102 Pelvic and perineal pain: Secondary | ICD-10-CM

## 2022-05-05 DIAGNOSIS — R109 Unspecified abdominal pain: Secondary | ICD-10-CM | POA: Diagnosis not present

## 2022-05-05 DIAGNOSIS — N952 Postmenopausal atrophic vaginitis: Secondary | ICD-10-CM | POA: Diagnosis not present

## 2022-05-05 DIAGNOSIS — N3000 Acute cystitis without hematuria: Secondary | ICD-10-CM | POA: Diagnosis not present

## 2022-05-05 LAB — URINALYSIS, ROUTINE W REFLEX MICROSCOPIC
Bilirubin, UA: NEGATIVE
Nitrite, UA: NEGATIVE
RBC, UA: NEGATIVE
Specific Gravity, UA: 1.02 (ref 1.005–1.030)
Urobilinogen, Ur: 1 mg/dL (ref 0.2–1.0)
pH, UA: 5.5 (ref 5.0–7.5)

## 2022-05-05 LAB — MICROSCOPIC EXAMINATION

## 2022-05-05 MED ORDER — PREMARIN 0.625 MG/GM VA CREA: TOPICAL_CREAM | VAGINAL | 12 refills | Status: AC

## 2022-05-05 NOTE — Progress Notes (Signed)
Subjective: 1. Personal history of urinary infection   2. Pelvic pain in female   3. Right flank pain   4. Vaginal atrophy   5. Right lower quadrant abdominal pain      Consult requested by Marland Kitchen MD.  Monique Gomez is a 67 yo female who had UTI symptoms in October and was treated.  She had another episode in November and had Klebsiella that was treated with bactrim.  She was seen again in December with symptoms.  Other cultures showed Mx species. She will respond to the antibioitics.  She has no discharge.  She has no dysuria.  She has frequency but has a high fluid intake with water and cranberry juice.  She has nocturia 3-4x which is worse.  She has no incontinence.  She feels she empties well.  She has burning across her lower abdomen and into her sides.  She has pressure in the suprapubic area with sitting.  The pressure doesn't lateralized but can radiate to the mid lower back.  She had some UTI's several years ago.  She has no history of stones.  She has had no GU surgery.   She has had no hematuria.  She has no GI complaints.  The discomfort is worse with standing and is a dull pain.  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  ROS:  Review of Systems  HENT:  Positive for congestion.   Respiratory:  Positive for shortness of breath.   Gastrointestinal:  Positive for heartburn.  Musculoskeletal:  Positive for back pain and joint pain.  Neurological:  Positive for headaches.  All other systems reviewed and are negative.   Allergies  Allergen Reactions   Venlafaxine Nausea And Vomiting and Other (See Comments)    Dizziness, shakiness    Past Medical History:  Diagnosis Date   Abdominal fibromatosis    Allergic rhinitis    Anemia    Asthma    Essential hypertension    GERD (gastroesophageal reflux disease)    History of cardiac catheterization    No significant CAD May 2015   History of migraine headaches    Hyperlipidemia    Iron deficiency anemia 02/05/2021    Myocarditis ([place])    a. diagnosed in 05/2020 with cath showing normal cors   Obesity    PAF (paroxysmal atrial fibrillation) ([place]) 08/2013   Sleep apnea    Type 2 diabetes mellitus [facility])     Past Surgical History:  Procedure Laterality Date   CATARACT EXTRACTION     CATARACT EXTRACTION W/PHACO Right 04/05/2019   Procedure: CATARACT EXTRACTION PHACO AND INTRAOCULAR LENS PLACEMENT RIGHT EYE (CDE: 3.19);  Surgeon: Baruch Goldmann, MD;  Location: [facility];  Service: Ophthalmology;  Laterality: Right;   COLONOSCOPY N/A 02/02/2015   SLF: 1. one colon polyp removed-no source for anemia identified. 2. moderate diverticulosis noted in the sigmoid colon and descending colon 3. the left colon is redundant 4. Rectal bleeding due ot small internal hemorroids 5. Moderate sized external hemorrhoids.   ESOPHAGOGASTRODUODENOSCOPY N/A 02/02/2015   SLF: 1. 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 large hiatal hernia 3. mild non-erosive gastritis 4. No source for anemia identified.    LEFT HEART CATH AND CORONARY ANGIOGRAPHY N/A 05/14/2020   Procedure: LEFT HEART CATH AND CORONARY ANGIOGRAPHY;  Surgeon: Belva Crome, MD;  Location: [facility];  Service: Cardiovascular;  Laterality: N/A;   LEFT HEART CATHETERIZATION WITH CORONARY ANGIOGRAM N/A 09/03/2013   Procedure: LEFT HEART CATHETERIZATION  WITH CORONARY ANGIOGRAM;  Surgeon: Jettie Booze, MD;  Location: [facility] CATH LAB;  Service: Cardiovascular;  Laterality: N/A;   Right carpal tunnel release      Social History   Socioeconomic History   Marital status: Single    Spouse name: Not on file   Number of children: 2   Years of education: Not on file   Highest education level: Not on file  Occupational History   Occupation: Education officer, museum    Employer: [employer]  Tobacco Use   Smoking status: Former    Packs/day: 0.30    Types: Cigarettes    Quit date: 04/01/2012    Years since quitting: 10.0   Smokeless tobacco: Never   Vaping Use   Vaping Use: Never used  Substance and Sexual Activity   Alcohol use: Not Currently    Alcohol/week: 0.0 standard drinks of alcohol    Comment: very occasional   Drug use: No   Sexual activity: Not Currently    Birth control/protection: Post-menopausal  Other Topics Concern   Not on file  Social History Narrative   Not on file   Social Determinants of Health   Financial Resource Strain: Low Risk  (04/05/2022)   Overall Financial Resource Strain (CARDIA)    Difficulty of Paying Living Expenses: Not hard at all  Food Insecurity: No Food Insecurity (04/05/2022)   Hunger Vital Sign    Worried About Running Out of Food in the Last Year: Never true    [place] in the Last Year: Never true  Transportation Needs: No Transportation Needs (04/05/2022)   PRAPARE - Hydrologist (Medical): No    Lack of Transportation (Non-Medical): No  Physical Activity: Insufficiently Active (04/05/2022)   Exercise Vital Sign    Days of Exercise per Week: 2 days    Minutes of Exercise per Session: 10 min  Stress: No Stress Concern Present (04/05/2022)   [place]    Feeling of Stress : Not at all  Social Connections: Moderately Isolated (04/05/2022)   Social Connection and Isolation Panel [NHANES]    Frequency of Communication with Friends and Family: More than three times a week    Frequency of Social Gatherings with Friends and Family: More than three times a week    Attends Religious Services: 1 to 4 times per year    Active Member of [employer] or Organizations: No    Attends Archivist Meetings: Never    Marital Status: Never married  Intimate Partner Violence: Not At Risk (04/05/2022)   Humiliation, Afraid, Rape, and Kick questionnaire    Fear of Current or Ex-Partner: No    Emotionally Abused: No    Physically Abused: No    Sexually Abused: No    Family History  Problem  Relation Age of Onset   Diabetes Mother    Hypertension Mother    Heart disease Mother    High Cholesterol Mother    Alzheimer's disease Father    Hypertension Sister    Hypertension Brother    Diabetes Brother    Arthritis Sister    Hypertension Sister    High Cholesterol Sister    Diabetes Son    Developmental delay Son    Colon cancer Neg Hx     Anti-infectives: Anti-infectives (From admission, onward)    None       Current Outpatient Medications  Medication Sig Dispense Refill  acetaminophen (TYLENOL) 500 MG tablet Take 1 tablet (500 mg total) by mouth every 6 (six) hours as needed. 30 tablet 0   atorvastatin (LIPITOR) 80 MG tablet Take 1 tablet by mouth once daily 90 tablet 3   blood glucose meter kit and supplies Dispense based on patient and insurance preference. Use up to four times daily as directed. (FOR ICD-10 E10.9, E11.9). 1 each 0   botulinum toxin Type A (BOTOX) 200 units injection Inject 155 Units into the muscle every 3 (three) months. 1 each 2   conjugated estrogens (PREMARIN) vaginal cream Apply 0.5 gm which is a pea-sized amount with the applicator or fingertip vaginally at bedtime for 2 weeks and then 2-3 x weekly. 42.5 g 12   cyclobenzaprine (FLEXERIL) 10 MG tablet One tablet every twelve hours as needed for spasm. 60 tablet 2   ELIQUIS 5 MG TABS tablet Take 1 tablet by mouth twice daily 180 tablet 0   fluticasone (FLONASE) 50 MCG/ACT nasal spray Place 2 sprays into both nostrils daily. 16 g 5   Galcanezumab-gnlm (EMGALITY) 120 MG/ML SOAJ INJECT 120 MG  SUBCUTANEOUSLY ONCE EVERY 30 DAYS 1 mL 11   HYDROcodone-acetaminophen (NORCO/VICODIN) 5-325 MG tablet One tablet every four hours as needed for acute pain.  Limit of five days per  statue. 30 tablet 0   ipratropium (ATROVENT) 0.03 % nasal spray Place 2 sprays into both nostrils every 12 (twelve) hours. 30 mL 3   Iron-FA-B Cmp-C-Biot-Probiotic (FUSION PLUS) CAPS Take 1 capsule by mouth daily. 30  capsule 11   JARDIANCE 10 MG TABS tablet TAKE 1 TABLET BY MOUTH ONCE DAILY BEFORE BREAKFAST 90 tablet 3   meclizine (ANTIVERT) 25 MG tablet Take 1 tablet (25 mg total) by mouth 3 (three) times daily as needed for dizziness. 30 tablet 0   metFORMIN (GLUCOPHAGE) 500 MG tablet TAKE 1 TABLET BY MOUTH TWICE DAILY WITH A MEAL 180 tablet 0   metoCLOPramide (REGLAN) 10 MG tablet Take 1 tablet (10 mg total) by mouth every 8 (eight) hours as needed for up to 7 days for nausea (Headache). 21 tablet 0   metoprolol succinate (TOPROL-XL) 50 MG 24 hr tablet TAKE 1 TABLET BY MOUTH  IN THE MORNING AND AT BEDTIME. TAKE WITH OR IMMEDIATELY FOLLOWING A MEAL 180 tablet 3   pantoprazole (PROTONIX) 40 MG tablet Take 1 tablet by mouth once daily 90 tablet 0   sacubitril-valsartan (ENTRESTO) 97-103 MG Take 1 tablet by mouth 2 (two) times daily. 180 tablet 3   topiramate (TOPAMAX) 50 MG tablet Take 2 tablets (100 mg total) by mouth at bedtime. 180 tablet 3   Ubrogepant (UBRELVY) 50 MG TABS TAKE ONE TABLET BY MOUTH AT THE ONSET OF A MIGRAINE. CAN REPEAT IN 2 HOURS IF NEEDED 10 tablet 11   No current facility-administered medications for this visit.     Objective: Vital signs in last 24 hours: BP 107/77   Pulse 98   Intake/Output from previous day: No intake/output data recorded. Intake/Output this shift: _0 @   Physical Exam Vitals reviewed.  Constitutional:      Appearance: Normal appearance. She is obese.  Cardiovascular:     Rate and Rhythm: Normal rate and regular rhythm.  Pulmonary:     Effort: Pulmonary effort is normal. No respiratory distress.     Breath sounds: Normal breath sounds.  Abdominal:     Palpations: Abdomen is soft.     Comments: There is lower abdominal tenderness and some right flank tenderness. No  mass or hernias are noted.  Genitourinary:    Comments: Normal external genitalia. Normal urethral meatus. No urethral hypermobility or incontinence. Mild cervical descent  without cystocele or rectocele. Mild vaginal mucosal atrophy. No bladder or vaginal wall tenderness. Musculoskeletal:        General: No swelling or tenderness. Normal range of motion.  Skin:    General: Skin is warm and dry.  Neurological:     General: No focal deficit present.     Mental Status: She is alert and oriented to person, place, and time.  Psychiatric:        Mood and Affect: Mood normal.        Behavior: Behavior normal.     Lab Results:  Results for orders placed or performed in visit on 05/05/22 (from the past 24 hour(s))  Urinalysis, Routine w reflex microscopic     Status: Abnormal   Collection Time: 05/05/22  8:54 AM  Result Value Ref Range   Specific Gravity, UA 1.020 1.005 - 1.030   pH, UA 5.5 5.0 - 7.5   Color, UA Yellow Yellow   Appearance Ur Cloudy (A) Clear   Leukocytes,UA 1+ (A) Negative   Protein,UA 2+ (A) Negative/Trace   Glucose, UA 3+ (A) Negative   Ketones, UA Trace (A) Negative   RBC, UA Negative Negative   Bilirubin, UA Negative Negative   Urobilinogen, Ur 1.0 0.2 - 1.0 mg/dL   Nitrite, UA Negative Negative   Microscopic Examination See below:    Narrative   Performed at:  [facility] [address], [place], [place]  [id number] Lab Director: Mina Marble MT, Phone:  [phone]  Microscopic Examination     Status: None   Collection Time: 05/05/22  8:54 AM   Urine  Result Value Ref Range   WBC, UA 0-5 0 - 5 /hpf   RBC, Urine 0-2 0 - 2 /hpf   Epithelial Cells (non renal) 0-10 0 - 10 /hpf   Bacteria, UA Few None seen/Few   Narrative   Performed at:  [facility] [address], [place], [place]  [id number] Lab Director: [place], Phone:  [phone]    BMET No results for input(s): "NA", "K", "CL", "CO2", "GLUCOSE", "BUN", "CREATININE", "CALCIUM" in the last 72 hours. PT/INR No results for input(s): "LABPROT", "INR" in the last 72 hours. ABG No results for input(s): "PHART", "HCO3" in the last  72 hours.  Invalid input(s): "PCO2", "PO2" Recent Results (from the past 2160 hour(s))  Cervicovaginal ancillary only     Status: None   Collection Time: 02/28/22  3:30 PM  Result Value Ref Range   Bacterial Vaginitis (gardnerella) Negative    Candida Vaginitis Negative    Candida Glabrata Negative    Comment Normal Reference Range Candida Species - Negative    Comment Normal Reference Range Candida Galbrata - Negative    Comment      Normal Reference Range Bacterial Vaginosis - Negative  POCT urinalysis dipstick     Status: Abnormal   Collection Time: 02/28/22  3:39 PM  Result Value Ref Range   Color, UA yellow yellow   Clarity, UA clear clear   Glucose, UA >=1,000 (A) negative mg/dL   Bilirubin, UA negative negative   Ketones, POC UA trace (5) (A) negative mg/dL   Spec Grav, UA 1.015 1.010 - 1.025   Blood, UA negative negative   pH, UA 6.0 5.0 - 8.0   Protein Ur, POC =30 (A) negative  mg/dL   Urobilinogen, UA 1.0 0.2 or 1.0 E.U./dL   Nitrite, UA Positive (A) Negative   Leukocytes, UA Negative Negative  Urine Culture     Status: Abnormal   Collection Time: 02/28/22  3:46 PM   Specimen: Urine, Clean Catch  Result Value Ref Range   Specimen Description      URINE, CLEAN CATCH Performed at [facility], [address], [place], [place] [address]    Special Requests      NONE Performed at [facility], [address] [place], [place] [address]    Culture MULTIPLE SPECIES PRESENT, SUGGEST RECOLLECTION (A)    Report Status 03/02/2022 FINAL   CBC with Differential/Platelet     Status: Abnormal   Collection Time: 03/09/22 12:53 PM  Result Value Ref Range   WBC 10.8 (H) 4.0 - 10.5 K/uL   RBC 5.49 (H) 3.87 - 5.11 MIL/uL   Hemoglobin 11.8 (L) 12.0 - 15.0 g/dL   HCT 40.3 36.0 - 46.0 %   MCV 73.4 (L) 80.0 - 100.0 fL   MCH 21.5 (L) 26.0 - 34.0 pg   MCHC 29.3 (L) 30.0 - 36.0 g/dL   RDW 16.3 (H) 11.5 - 15.5 %   Platelets 333 150 - 400 K/uL   nRBC 0.0 0.0 - 0.2 %   Neutrophils  Relative % 70 %   Neutro Abs 7.6 1.7 - 7.7 K/uL   Lymphocytes Relative 20 %   Lymphs Abs 2.2 0.7 - 4.0 K/uL   Monocytes Relative 6 %   Monocytes Absolute 0.7 0.1 - 1.0 K/uL   Eosinophils Relative 2 %   Eosinophils Absolute 0.3 0.0 - 0.5 K/uL   Basophils Relative 1 %   Basophils Absolute 0.1 0.0 - 0.1 K/uL   Immature Granulocytes 1 %   Abs Immature Granulocytes 0.05 0.00 - 0.07 K/uL    Comment: Performed at [facility], [address], [place], [place] [address]  Iron and TIBC     Status: None   Collection Time: 03/09/22 12:53 PM  Result Value Ref Range   Iron 70 28 - 170 ug/dL   TIBC 328 250 - 450 ug/dL   Saturation Ratios 21 10.4 - 31.8 %   UIBC 258 ug/dL    Comment: Performed at [facility], [address] [place], [place] [address]  Ferritin     Status: None   Collection Time: 03/09/22 12:53 PM  Result Value Ref Range   Ferritin 22 11 - 307 ng/mL    Comment: Performed at [facility], [address], [place], [place] [address]  Hgb Fractionation Cascade     Status: None   Collection Time: 03/09/22 12:53 PM  Result Value Ref Range   Hgb F 0.0 0.0 - 2.0 %   Hgb A 97.5 96.4 - 98.8 %   Hgb A2 2.5 1.8 - 3.2 %   Hgb S 0.0 0.0 %   Interpretation, Hgb Fract Comment     Comment: (NOTE) Normal hemoglobin present; no hemoglobin variant or beta thalassemia identified. Note: Alpha thalassemia may not be detected by the Hgb Fractionation Cascade panel. If alpha thalassemia is suspected, [facility] offers Alpha-Thalassemia DNA Analysis [phone]). Performed At: [facility] [place], [place] [id number] Rush Farmer MD [id number]   POCT urinalysis dipstick     Status: Abnormal   Collection Time: 03/15/22  1:51 PM  Result Value Ref Range   Color, UA orange (A) yellow   Clarity, UA clear clear   Glucose, UA >=1,000 (  A) negative mg/dL   Bilirubin, UA moderate (A) negative   Ketones, POC UA small (15) (A) negative mg/dL   Spec Grav, UA 1.015 1.010 - 1.025    Blood, UA negative negative   pH, UA 5.0 5.0 - 8.0   Protein Ur, POC =100 (A) negative mg/dL   Urobilinogen, UA 4.0 (A) 0.2 or 1.0 E.U./dL   Nitrite, UA Positive (A) Negative   Leukocytes, UA Large (3+) (A) Negative  Urine Culture     Status: Abnormal   Collection Time: 03/15/22  2:00 PM   Specimen: Urine, Clean Catch  Result Value Ref Range   Specimen Description      URINE, CLEAN CATCH Performed at [facility], [address], [place], [place] [address]    Special Requests      NONE Performed at [facility], [address], [place], [place] [address]    Culture 30,000 COLONIES/mL KLEBSIELLA PNEUMONIAE (A)    Report Status 03/18/2022 FINAL    Organism ID, Bacteria KLEBSIELLA PNEUMONIAE (A)       Susceptibility   Klebsiella pneumoniae - MIC*    AMPICILLIN >=32 RESISTANT Resistant     CEFAZOLIN <=4 SENSITIVE Sensitive     CEFEPIME <=0.12 SENSITIVE Sensitive     CEFTRIAXONE <=0.25 SENSITIVE Sensitive     CIPROFLOXACIN <=0.25 SENSITIVE Sensitive     GENTAMICIN <=1 SENSITIVE Sensitive     IMIPENEM <=0.25 SENSITIVE Sensitive     NITROFURANTOIN 64 INTERMEDIATE Intermediate     TRIMETH/SULFA <=20 SENSITIVE Sensitive     AMPICILLIN/SULBACTAM 4 SENSITIVE Sensitive     PIP/TAZO <=4 SENSITIVE Sensitive     * 30,000 COLONIES/mL KLEBSIELLA PNEUMONIAE  POCT URINALYSIS DIP (CLINITEK)     Status: Abnormal   Collection Time: 03/31/22 10:06 AM  Result Value Ref Range   Color, UA yellow yellow   Clarity, UA clear clear   Glucose, UA >=1,000 (A) negative mg/dL   Bilirubin, UA negative negative   Ketones, POC UA negative negative mg/dL   Spec Grav, UA 1.020 1.010 - 1.025   Blood, UA negative negative   pH, UA 6.0 5.0 - 8.0   POC PROTEIN,UA negative negative, trace   Urobilinogen, UA 0.2 0.2 or 1.0 E.U./dL   Nitrite, UA Negative Negative   Leukocytes, UA Negative Negative  Urine Culture     Status: None   Collection Time: 04/01/22 11:37 AM   Specimen: Urine   UR  Result Value Ref  Range   Urine Culture, Routine Final report    Organism ID, Bacteria Comment     Comment: Mixed urogenital flora 10,000-25,000 colony forming units per mL   Cytology - PAP     Status: None   Collection Time: 04/05/22  2:32 PM  Result Value Ref Range   High risk HPV Negative    Neisseria Gonorrhea Negative    Chlamydia Negative    Trichomonas Negative    Adequacy      Satisfactory for evaluation; transformation zone component PRESENT.   Diagnosis      - Negative for intraepithelial lesion or malignancy (NILM)   Comment Normal Reference Range HPV - Negative    Comment Normal Reference Range Trichomonas - Negative    Comment Normal Reference [place] Chlamydia - Negative    Comment      Normal Reference Range Neisseria Gonorrhea - Negative  Urinalysis, Routine w reflex microscopic     Status: Abnormal   Collection Time: 05/05/22  8:54 AM  Result Value Ref Range  Specific Gravity, UA 1.020 1.005 - 1.030   pH, UA 5.5 5.0 - 7.5   Color, UA Yellow Yellow   Appearance Ur Cloudy (A) Clear   Leukocytes,UA 1+ (A) Negative   Protein,UA 2+ (A) Negative/Trace   Glucose, UA 3+ (A) Negative   Ketones, UA Trace (A) Negative   RBC, UA Negative Negative   Bilirubin, UA Negative Negative   Urobilinogen, Ur 1.0 0.2 - 1.0 mg/dL   Nitrite, UA Negative Negative   Microscopic Examination See below:     Comment: Microscopic was indicated and was performed.  Microscopic Examination     Status: None   Collection Time: 05/05/22  8:54 AM   Urine  Result Value Ref Range   WBC, UA 0-5 0 - 5 /hpf   RBC, Urine 0-2 0 - 2 /hpf   Epithelial Cells (non renal) 0-10 0 - 10 /hpf   Bacteria, UA Few None seen/Few    Studies/Results: No results found.   Assessment/Plan: History of UTI's.  She has had Klebsiella on one culture and Mx species on others. Her UA had some bacteria and 0-5 WBC and 0-2 RBC's on an unspun specimen today.  Cath culture sent for culture but PVR was scant.    Lower abdominal and  right flank pain/burning.  I will get a CT stone study to assess.  The pain could be referred from her back or possibly an intraabdominal process.  She is not a candidate for NSAID's because of the Eliquis.   Vaginal atrophy.  This is mild.  I have sent premarin cream and reviewed the instructions and side effects.  I will notify Dr. Elonda Husky.   Meds ordered this encounter  Medications   conjugated estrogens (PREMARIN) vaginal cream    Sig: Apply 0.5 gm which is a pea-sized amount with the applicator or fingertip vaginally at bedtime for 2 weeks and then 2-3 x weekly.    Dispense:  42.5 g    Refill:  12     Orders Placed This Encounter  Procedures   Urine Culture   Microscopic Examination   CT RENAL STONE STUDY    Standing Status:   Future    Standing Expiration Date:   11/03/2022    Order Specific Question:   Preferred imaging location?    Answer:   [facility]    Order Specific Question:   Radiology Contrast Protocol - do NOT remove file path    Answer:   [email or web address]   Urinalysis, Routine w reflex microscopic     Return in about 4 weeks (around 06/02/2022).    CC: Dr. Marland Kitchen.     Irine Seal 05/05/2022

## 2022-05-06 ENCOUNTER — Inpatient Hospital Stay: Payer: Medicare HMO

## 2022-05-07 LAB — URINE CULTURE

## 2022-05-09 ENCOUNTER — Telehealth: Payer: Self-pay

## 2022-05-09 NOTE — Telephone Encounter (Signed)
Made patient aware that hr urine culture showed small growth of mx species and no treatment needed. Patient voiced understanding.

## 2022-05-09 NOTE — Telephone Encounter (Signed)
-----   Message from Irine Seal, MD sent at 05/09/2022 10:03 AM EST ----- The culture has a small growth of Mx species and needs no treatment.  ----- Message ----- From: Sherrilyn Rist, CMA Sent: 05/09/2022   8:49 AM EST To: Irine Seal, MD  Please review

## 2022-05-10 ENCOUNTER — Ambulatory Visit: Payer: Medicare HMO | Admitting: Orthopaedic Surgery

## 2022-05-12 ENCOUNTER — Ambulatory Visit (HOSPITAL_COMMUNITY): Payer: Medicare HMO

## 2022-05-16 ENCOUNTER — Encounter: Payer: Self-pay | Admitting: Internal Medicine

## 2022-05-16 ENCOUNTER — Ambulatory Visit (INDEPENDENT_AMBULATORY_CARE_PROVIDER_SITE_OTHER): Payer: Medicare HMO | Admitting: Internal Medicine

## 2022-05-16 VITALS — BP 125/86 | HR 123 | Ht 62.0 in | Wt 267.8 lb

## 2022-05-16 DIAGNOSIS — E559 Vitamin D deficiency, unspecified: Secondary | ICD-10-CM

## 2022-05-16 DIAGNOSIS — E785 Hyperlipidemia, unspecified: Secondary | ICD-10-CM | POA: Diagnosis not present

## 2022-05-16 DIAGNOSIS — E1169 Type 2 diabetes mellitus with other specified complication: Secondary | ICD-10-CM

## 2022-05-16 DIAGNOSIS — G43709 Chronic migraine without aura, not intractable, without status migrainosus: Secondary | ICD-10-CM

## 2022-05-16 DIAGNOSIS — I517 Cardiomegaly: Secondary | ICD-10-CM

## 2022-05-16 DIAGNOSIS — J4520 Mild intermittent asthma, uncomplicated: Secondary | ICD-10-CM | POA: Diagnosis not present

## 2022-05-16 DIAGNOSIS — I10 Essential (primary) hypertension: Secondary | ICD-10-CM | POA: Diagnosis not present

## 2022-05-16 DIAGNOSIS — I480 Paroxysmal atrial fibrillation: Secondary | ICD-10-CM | POA: Diagnosis not present

## 2022-05-16 DIAGNOSIS — J45909 Unspecified asthma, uncomplicated: Secondary | ICD-10-CM | POA: Insufficient documentation

## 2022-05-16 DIAGNOSIS — G4733 Obstructive sleep apnea (adult) (pediatric): Secondary | ICD-10-CM

## 2022-05-16 MED ORDER — ALBUTEROL SULFATE HFA 108 (90 BASE) MCG/ACT IN AERS: 2.0000 | INHALATION_SPRAY | Freq: Four times a day (QID) | RESPIRATORY_TRACT | 0 refills | Status: DC | PRN

## 2022-05-16 MED ORDER — BLOOD GLUCOSE MONITOR KIT: PACK | 0 refills | Status: AC

## 2022-05-16 NOTE — Assessment & Plan Note (Signed)
Last vitamin D Lab Results  Component Value Date   VD25OH 40.6 05/13/2021   Was on vitamin D supplement Check vitamin D level

## 2022-05-16 NOTE — Assessment & Plan Note (Signed)
Diet modification advised Mobility limited due to exertional dyspnea

## 2022-05-16 NOTE — Progress Notes (Signed)
Established Patient Office Visit  Subjective:  Patient ID: Monique Gomez, female    DOB: [birth year]  Age: 67 y.o. MRN: [id number]  CC:  Chief Complaint  Patient presents with   Congestive Heart Failure   Atrial Fibrillation   Shortness of Breath    Patient says recently she has noticed that she is getting short of breath    HPI Monique Gomez is a 67 y.o. female with past medical history of paroxysmal atrial fibrillation, hypertension, myocarditis, OSA on CPAP, GERD, DM, HLD and morbid obesity who presents for annual physical.   BP is well-controlled. Takes medications regularly. Patient denies  chest pain or palpitations.  She takes Entresto for history of hypertrophic cardiomegaly.  She is followed by cardiology and heart failure clinic.  She reports exertional dyspnea, but denies any orthopnea or PND.  Denies any leg swelling.  Denies any dizziness or palpitations currently.  She has chronic nasal congestion and postnasal drip.  She has history of DM, for which she takes metformin.  She has been also taking Jardiance for history of myocarditis/CHF.  She denies any fatigue, polyuria or polydipsia currently.  She uses CPAP for OSA.   She is on Emgality for migraine.      Past Medical History:  Diagnosis Date   Abdominal fibromatosis    Allergic rhinitis    Anemia    Asthma    Essential hypertension    GERD (gastroesophageal reflux disease)    History of cardiac catheterization    No significant CAD May 2015   History of migraine headaches    Hyperlipidemia    Iron deficiency anemia 02/05/2021   Myocarditis ([place])    a. diagnosed in 05/2020 with cath showing normal cors   Obesity    PAF (paroxysmal atrial fibrillation) ([place]) 08/2013   Sleep apnea    Type 2 diabetes mellitus [facility])     Past Surgical History:  Procedure Laterality Date   CATARACT EXTRACTION     CATARACT EXTRACTION W/PHACO Right 04/05/2019   Procedure: CATARACT EXTRACTION PHACO AND  INTRAOCULAR LENS PLACEMENT RIGHT EYE (CDE: 3.19);  Surgeon: Baruch Goldmann, MD;  Location: [facility];  Service: Ophthalmology;  Laterality: Right;   COLONOSCOPY N/A 02/02/2015   SLF: 1. one colon polyp removed-no source for anemia identified. 2. moderate diverticulosis noted in the sigmoid colon and descending colon 3. the left colon is redundant 4. Rectal bleeding due ot small internal hemorroids 5. Moderate sized external hemorrhoids.   ESOPHAGOGASTRODUODENOSCOPY N/A 02/02/2015   SLF: 1. 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 large hiatal hernia 3. mild non-erosive gastritis 4. No source for anemia identified.    LEFT HEART CATH AND CORONARY ANGIOGRAPHY N/A 05/14/2020   Procedure: LEFT HEART CATH AND CORONARY ANGIOGRAPHY;  Surgeon: Belva Crome, MD;  Location: [place] CV LAB;  Service: Cardiovascular;  Laterality: N/A;   LEFT HEART CATHETERIZATION WITH CORONARY ANGIOGRAM N/A 09/03/2013   Procedure: LEFT HEART CATHETERIZATION WITH CORONARY ANGIOGRAM;  Surgeon: Jettie Booze, MD;  Location: [facility] CATH LAB;  Service: Cardiovascular;  Laterality: N/A;   Right carpal tunnel release      Family History  Problem Relation Age of Onset   Diabetes Mother    Hypertension Mother    Heart disease Mother    High Cholesterol Mother    Alzheimer's disease Father    Hypertension Sister    Hypertension Brother    Diabetes Brother    Arthritis Sister    Hypertension Sister  High Cholesterol Sister    Diabetes Son    Developmental delay Son    Colon cancer Neg Hx     Social History   Socioeconomic History   Marital status: Single    Spouse name: Not on file   Number of children: 2   Years of education: Not on file   Highest education level: Not on file  Occupational History   Occupation: Education officer, museum    Employer: [employer]  Tobacco Use   Smoking status: Former    Packs/day: 0.30    Types: Cigarettes    Quit date: 04/01/2012    Years since quitting: 10.1    Smokeless tobacco: Never  Vaping Use   Vaping Use: Never used  Substance and Sexual Activity   Alcohol use: Not Currently    Alcohol/week: 0.0 standard drinks of alcohol    Comment: very occasional   Drug use: No   Sexual activity: Not Currently    Birth control/protection: Post-menopausal  Other Topics Concern   Not on file  Social History Narrative   Not on file   Social Determinants of Health   Financial Resource Strain: Low Risk  (04/05/2022)   Overall Financial Resource Strain (CARDIA)    Difficulty of Paying Living Expenses: Not hard at all  Food Insecurity: No Food Insecurity (04/05/2022)   Hunger Vital Sign    Worried About Running Out of Food in the Last Year: Never true    [place] in the Last Year: Never true  Transportation Needs: No Transportation Needs (04/05/2022)   PRAPARE - Hydrologist (Medical): No    Lack of Transportation (Non-Medical): No  Physical Activity: Insufficiently Active (04/05/2022)   Exercise Vital Sign    Days of Exercise per Week: 2 days    Minutes of Exercise per Session: 10 min  Stress: No Stress Concern Present (04/05/2022)   [place]    Feeling of Stress : Not at all  Social Connections: Moderately Isolated (04/05/2022)   Social Connection and Isolation Panel [NHANES]    Frequency of Communication with Friends and Family: More than three times a week    Frequency of Social Gatherings with Friends and Family: More than three times a week    Attends Religious Services: 1 to 4 times per year    Active Member of [employer] or Organizations: No    Attends Archivist Meetings: Never    Marital Status: Never married  Intimate Partner Violence: Not At Risk (04/05/2022)   Humiliation, Afraid, Rape, and Kick questionnaire    Fear of Current or Ex-Partner: No    Emotionally Abused: No    Physically Abused: No    Sexually Abused: No     Outpatient Medications Prior to Visit  Medication Sig Dispense Refill   acetaminophen (TYLENOL) 500 MG tablet Take 1 tablet (500 mg total) by mouth every 6 (six) hours as needed. 30 tablet 0   atorvastatin (LIPITOR) 80 MG tablet Take 1 tablet by mouth once daily 90 tablet 3   botulinum toxin Type A (BOTOX) 200 units injection Inject 155 Units into the muscle every 3 (three) months. 1 each 2   conjugated estrogens (PREMARIN) vaginal cream Apply 0.5 gm which is a pea-sized amount with the applicator or fingertip vaginally at bedtime for 2 weeks and then 2-3 x weekly. 42.5 g 12   cyclobenzaprine (FLEXERIL) 10 MG tablet One tablet every twelve  hours as needed for spasm. 60 tablet 2   ELIQUIS 5 MG TABS tablet Take 1 tablet by mouth twice daily 180 tablet 0   fluticasone (FLONASE) 50 MCG/ACT nasal spray Place 2 sprays into both nostrils daily. 16 g 5   Galcanezumab-gnlm (EMGALITY) 120 MG/ML SOAJ INJECT 120 MG  SUBCUTANEOUSLY ONCE EVERY 30 DAYS 1 mL 11   HYDROcodone-acetaminophen (NORCO/VICODIN) 5-325 MG tablet One tablet every four hours as needed for acute pain.  Limit of five days per [place]. 30 tablet 0   ipratropium (ATROVENT) 0.03 % nasal spray Place 2 sprays into both nostrils every 12 (twelve) hours. 30 mL 3   Iron-FA-B Cmp-C-Biot-Probiotic (FUSION PLUS) CAPS Take 1 capsule by mouth daily. 30 capsule 11   JARDIANCE 10 MG TABS tablet TAKE 1 TABLET BY MOUTH ONCE DAILY BEFORE BREAKFAST 90 tablet 3   meclizine (ANTIVERT) 25 MG tablet Take 1 tablet (25 mg total) by mouth 3 (three) times daily as needed for dizziness. 30 tablet 0   metFORMIN (GLUCOPHAGE) 500 MG tablet TAKE 1 TABLET BY MOUTH TWICE DAILY WITH A MEAL 180 tablet 0   metoCLOPramide (REGLAN) 10 MG tablet Take 1 tablet (10 mg total) by mouth every 8 (eight) hours as needed for up to 7 days for nausea (Headache). 21 tablet 0   metoprolol succinate (TOPROL-XL) 50 MG 24 hr tablet TAKE 1 TABLET BY MOUTH  IN THE MORNING AND AT  BEDTIME. TAKE WITH OR IMMEDIATELY FOLLOWING A MEAL 180 tablet 3   pantoprazole (PROTONIX) 40 MG tablet Take 1 tablet by mouth once daily 90 tablet 0   sacubitril-valsartan (ENTRESTO) 97-103 MG Take 1 tablet by mouth 2 (two) times daily. 180 tablet 3   topiramate (TOPAMAX) 50 MG tablet Take 2 tablets (100 mg total) by mouth at bedtime. 180 tablet 3   Ubrogepant (UBRELVY) 50 MG TABS TAKE ONE TABLET BY MOUTH AT THE ONSET OF A MIGRAINE. CAN REPEAT IN 2 HOURS IF NEEDED 10 tablet 11   blood glucose meter kit and supplies Dispense based on patient and insurance preference. Use up to four times daily as directed. (FOR ICD-10 E10.9, E11.9). 1 each 0   No facility-administered medications prior to visit.    Allergies  Allergen Reactions   Venlafaxine Nausea And Vomiting and Other (See Comments)    Dizziness, shakiness    ROS Review of Systems  Constitutional:  Negative for chills and fever.  HENT:  Positive for congestion, postnasal drip and sinus pain. Negative for sore throat.   Eyes:  Negative for pain and discharge.  Respiratory:  Positive for shortness of breath. Negative for cough.   Cardiovascular:  Negative for chest pain and palpitations.  Gastrointestinal:  Negative for abdominal pain, diarrhea, nausea and vomiting.  Endocrine: Negative for polydipsia and polyuria.  Genitourinary:  Negative for dysuria and hematuria.  Musculoskeletal:  Positive for back pain. Negative for neck pain and neck stiffness.  Skin:  Negative for rash.  Neurological:  Positive for headaches. Negative for weakness.  Psychiatric/Behavioral:  Negative for agitation and behavioral problems.       Objective:    Physical Exam Vitals reviewed.  Constitutional:      General: She is not in acute distress.    Appearance: She is obese. She is not diaphoretic.  HENT:     Head: Normocephalic and atraumatic.     Nose: Congestion present.     Mouth/Throat:     Mouth: Mucous membranes are moist.  Eyes:  General: No scleral icterus.    Extraocular Movements: Extraocular movements intact.  Cardiovascular:     Rate and Rhythm: Normal rate and regular rhythm.     Pulses: Normal pulses.     Heart sounds: Normal heart sounds. No murmur heard. Pulmonary:     Breath sounds: Normal breath sounds. No wheezing or rales.  Musculoskeletal:     Cervical back: Neck supple. No tenderness.     Right lower leg: No edema.     Left lower leg: No edema.  Skin:    General: Skin is warm.     Findings: Erythema (Chronic, over right side of face) present. No rash.  Neurological:     General: No focal deficit present.     Mental Status: She is alert and oriented to person, place, and time.     Cranial Nerves: No cranial nerve deficit.     Sensory: No sensory deficit.     Motor: No weakness.  Psychiatric:        Mood and Affect: Mood normal.        Behavior: Behavior normal.     BP 125/86 (BP Location: Right Arm, Patient Position: Sitting, Cuff Size: Large)   Pulse (!) 123   Ht '5\' 2"'$  (1.575 m)   Wt 267 lb 12.8 oz (121.5 kg)   SpO2 98%   BMI 48.98 kg/m  Wt Readings from Last 3 Encounters:  05/16/22 267 lb 12.8 oz (121.5 kg)  04/05/22 269 lb (122 kg)  03/15/22 267 lb 3.2 oz (121.2 kg)    Lab Results  Component Value Date   TSH 2.590 05/13/2021   Lab Results  Component Value Date   WBC 10.8 (H) 03/09/2022   HGB 11.8 (L) 03/09/2022   HCT 40.3 03/09/2022   MCV 73.4 (L) 03/09/2022   PLT 333 03/09/2022   Lab Results  Component Value Date   NA 140 11/02/2021   K 3.6 11/02/2021   CO2 23 11/02/2021   GLUCOSE 137 (H) 11/02/2021   BUN 20 11/02/2021   CREATININE 0.94 11/02/2021   BILITOT <0.2 05/13/2021   ALKPHOS 100 05/13/2021   AST 16 05/13/2021   ALT 14 05/13/2021   PROT 7.1 05/13/2021   ALBUMIN 4.1 05/13/2021   CALCIUM 9.0 11/02/2021   ANIONGAP 8 11/02/2021   EGFR 75 05/13/2021   Lab Results  Component Value Date   CHOL 174 05/13/2021   Lab Results  Component Value Date   HDL  41 05/13/2021   Lab Results  Component Value Date   LDLCALC 114 (H) 05/13/2021   Lab Results  Component Value Date   TRIG 101 05/13/2021   Lab Results  Component Value Date   CHOLHDL 4.2 05/13/2021   Lab Results  Component Value Date   HGBA1C 6.6 11/10/2021   HGBA1C 6.6 11/10/2021      Assessment & Plan:   Problem List Items Addressed This Visit       Cardiovascular and Mediastinum   Migraine (Chronic)    On Emgality and Topamax Ubrelvy PRN Follows up with Neurology      HTN (hypertension) (Chronic)    BP Readings from Last 1 Encounters:  05/16/22 125/86  Well-controlled with Entresto and Metoprolol Counseled for compliance with the medications Advised DASH diet and moderate exercise/walking, at least 150 mins/week      Relevant Orders   TSH   CBC with Differential/Platelet   PAF (paroxysmal atrial fibrillation) (HCC) (Chronic)    Currently in sinus rhythm On Toprol and  Eliquis Follows up with Cardiologist      Relevant Orders   TSH   CBC with Differential/Platelet   Hypertrophic cardiomegaly - Primary    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 Followed by cardiology and heart failure clinic On Entresto, Jardiance and metoprolol Appears euvolemic currently      Relevant Orders   CBC with Differential/Platelet     Respiratory   Obstructive sleep apnea (Chronic)   Relevant Orders   TSH   CBC with Differential/Platelet   Reactive airway disease    Has exertional dyspnea with history of allergic rhinitis - concern for reactive airway disease Started albuterol inhaler as needed for dyspnea She does not have orthopnea, PND or leg swelling currently If persistent dyspnea, will check CXR      Relevant Medications   albuterol (VENTOLIN HFA) 108 (90 Base) MCG/ACT inhaler   Other Relevant Orders   CBC with Differential/Platelet     Endocrine   DM (diabetes mellitus) (HCC) (Chronic)    Lab Results  Component Value Date   HGBA1C 6.6 11/10/2021    HGBA1C 6.6 11/10/2021  Associated with HTN, CHF and HLD Well-controlled On Metformin and Jardiance On statin Has an Ophthalmologist      Relevant Medications   blood glucose meter kit and supplies KIT   Other Relevant Orders   Hemoglobin A1c   CMP14+EGFR   Urine Microalbumin w/creat. ratio     Other   Hyperlipidemia (Chronic)    On Atorvastatin      Relevant Orders   Lipid panel   Morbid obesity ([place])    Diet modification advised Mobility limited due to exertional dyspnea      Relevant Orders   TSH   Vitamin D deficiency    Last vitamin D Lab Results  Component Value Date   VD25OH 40.6 05/13/2021  Was on vitamin D supplement Check vitamin D level      Relevant Orders   VITAMIN D 25 Hydroxy (Vit-D Deficiency, Fractures)    Meds ordered this encounter  Medications   albuterol (VENTOLIN HFA) 108 (90 Base) MCG/ACT inhaler    Sig: Inhale 2 puffs into the lungs every 6 (six) hours as needed for wheezing or shortness of breath.    Dispense:  18 g    Refill:  0    Okay to substitute to generic/formulary Albuterol.   blood glucose meter kit and supplies KIT    Sig: Dispense based on patient and insurance preference. Use up to four times daily as directed.    Dispense:  1 each    Refill:  0    Order Specific Question:   Number of strips    Answer:   100    Order Specific Question:   Number of lancets    Answer:   100    Follow-up: Return in about 4 months (around 09/14/2022) for DM and HTN.    Lindell Spar, MD

## 2022-05-16 NOTE — Assessment & Plan Note (Signed)
On Emgality and Topamax Ubrelvy PRN Follows up with Neurology

## 2022-05-16 NOTE — Assessment & Plan Note (Signed)
BP Readings from Last 1 Encounters:  05/16/22 125/86   Well-controlled with Entresto and Metoprolol Counseled for compliance with the medications Advised DASH diet and moderate exercise/walking, at least 150 mins/week

## 2022-05-16 NOTE — Assessment & Plan Note (Signed)
Currently in sinus rhythm On Toprol and Eliquis Follows up with Cardiologist

## 2022-05-16 NOTE — Assessment & Plan Note (Signed)
On Atorvastatin 

## 2022-05-16 NOTE — Assessment & Plan Note (Signed)
Has exertional dyspnea with history of allergic rhinitis - concern for reactive airway disease Started albuterol inhaler as needed for dyspnea She does not have orthopnea, PND or leg swelling currently If persistent dyspnea, will check CXR

## 2022-05-16 NOTE — Assessment & Plan Note (Signed)
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

## 2022-05-16 NOTE — Assessment & Plan Note (Addendum)
Lab Results  Component Value Date   HGBA1C 6.6 11/10/2021   HGBA1C 6.6 11/10/2021   Associated with HTN, CHF and HLD Well-controlled On Metformin and Jardiance On statin Has an Ophthalmologist

## 2022-05-16 NOTE — Patient Instructions (Addendum)
Please start taking Magnesium oxide 200 mg once daily.  Please use Albuterol as needed for shortness of breath.  Please continue taking other medications as prescribed.  Please continue to follow low-carb, low salt diet and ambulate as tolerated.  Please consider getting Shingrix vaccine at local pharmacy.

## 2022-05-17 ENCOUNTER — Other Ambulatory Visit: Payer: Self-pay | Admitting: Internal Medicine

## 2022-05-17 LAB — CBC WITH DIFFERENTIAL/PLATELET
Basophils Absolute: 0 10*3/uL (ref 0.0–0.2)
Basos: 0 %
EOS (ABSOLUTE): 0.2 10*3/uL (ref 0.0–0.4)
Eos: 2 %
Hematocrit: 41.3 % (ref 34.0–46.6)
Hemoglobin: 12.6 g/dL (ref 11.1–15.9)
Immature Grans (Abs): 0 10*3/uL (ref 0.0–0.1)
Immature Granulocytes: 0 %
Lymphocytes Absolute: 1.7 10*3/uL (ref 0.7–3.1)
Lymphs: 19 %
MCH: 22.1 pg — ABNORMAL LOW (ref 26.6–33.0)
MCHC: 30.5 g/dL — ABNORMAL LOW (ref 31.5–35.7)
MCV: 72 fL — ABNORMAL LOW (ref 79–97)
Monocytes Absolute: 0.5 10*3/uL (ref 0.1–0.9)
Monocytes: 6 %
Neutrophils Absolute: 6.6 10*3/uL (ref 1.4–7.0)
Neutrophils: 73 %
Platelets: 311 10*3/uL (ref 150–450)
RBC: 5.71 x10E6/uL — ABNORMAL HIGH (ref 3.77–5.28)
RDW: 16.1 % — ABNORMAL HIGH (ref 11.7–15.4)
WBC: 9.1 10*3/uL (ref 3.4–10.8)

## 2022-05-17 LAB — LIPID PANEL
Chol/HDL Ratio: 4.5 ratio — ABNORMAL HIGH (ref 0.0–4.4)
Cholesterol, Total: 174 mg/dL (ref 100–199)
HDL: 39 mg/dL — ABNORMAL LOW (ref >39)
LDL Chol Calc (NIH): 116 mg/dL — ABNORMAL HIGH (ref 0–99)
Triglycerides: 106 mg/dL (ref 0–149)
VLDL Cholesterol Cal: 19 mg/dL (ref 5–40)

## 2022-05-17 LAB — CMP14+EGFR
ALT: 12 IU/L (ref 0–32)
AST: 12 IU/L (ref 0–40)
Albumin/Globulin Ratio: 1.5 (ref 1.2–2.2)
Albumin: 4.1 g/dL (ref 3.9–4.9)
Alkaline Phosphatase: 106 IU/L (ref 44–121)
BUN/Creatinine Ratio: 17 (ref 12–28)
BUN: 15 mg/dL (ref 8–27)
Bilirubin Total: <0.2 mg/dL (ref 0.0–1.2)
CO2: 19 mmol/L — ABNORMAL LOW (ref 20–29)
Calcium: 10 mg/dL (ref 8.7–10.3)
Chloride: 109 mmol/L — ABNORMAL HIGH (ref 96–106)
Creatinine, Ser: 0.88 mg/dL (ref 0.57–1.00)
Globulin, Total: 2.7 g/dL (ref 1.5–4.5)
Glucose: 106 mg/dL — ABNORMAL HIGH (ref 70–99)
Potassium: 3.9 mmol/L (ref 3.5–5.2)
Sodium: 145 mmol/L — ABNORMAL HIGH (ref 134–144)
Total Protein: 6.8 g/dL (ref 6.0–8.5)
eGFR: 73 mL/min/{1.73_m2} (ref >59)

## 2022-05-17 LAB — HEMOGLOBIN A1C
Est. average glucose Bld gHb Est-mCnc: 128 mg/dL
Hgb A1c MFr Bld: 6.1 % — ABNORMAL HIGH (ref 4.8–5.6)

## 2022-05-17 LAB — TSH: TSH: 3.59 u[IU]/mL (ref 0.450–4.500)

## 2022-05-17 LAB — VITAMIN D 25 HYDROXY (VIT D DEFICIENCY, FRACTURES): Vit D, 25-Hydroxy: 20.2 ng/mL — ABNORMAL LOW (ref 30.0–100.0)

## 2022-05-18 LAB — MICROALBUMIN / CREATININE URINE RATIO
Creatinine, Urine: 348.5 mg/dL
Microalb/Creat Ratio: 27 mg/g creat (ref 0–29)
Microalbumin, Urine: 95.4 ug/mL

## 2022-05-19 ENCOUNTER — Telehealth: Payer: Self-pay | Admitting: Neurology

## 2022-05-19 ENCOUNTER — Ambulatory Visit (HOSPITAL_COMMUNITY)
Admission: RE | Admit: 2022-05-19 | Discharge: 2022-05-19 | Disposition: A | Discharge status: Home / Self Care | Payer: Medicare HMO | Source: Ambulatory Visit | Attending: Urology | Admitting: Urology

## 2022-05-19 DIAGNOSIS — R1031 Right lower quadrant pain: Secondary | ICD-10-CM | POA: Diagnosis not present

## 2022-05-19 DIAGNOSIS — K449 Diaphragmatic hernia without obstruction or gangrene: Secondary | ICD-10-CM | POA: Diagnosis not present

## 2022-05-19 DIAGNOSIS — I700 Atherosclerosis of aorta: Secondary | ICD-10-CM | POA: Diagnosis not present

## 2022-05-19 NOTE — Telephone Encounter (Signed)
Pt is on the schedule for botox injection for 06/15/22 and will need a new auth before appointment. Prior PA expired 05/01/22

## 2022-05-20 ENCOUNTER — Inpatient Hospital Stay: Payer: Medicare HMO | Attending: Hematology

## 2022-05-20 VITALS — BP 118/86 | HR 73 | Temp 96.9°F | Resp 20

## 2022-05-20 DIAGNOSIS — D508 Other iron deficiency anemias: Secondary | ICD-10-CM

## 2022-05-20 DIAGNOSIS — D509 Iron deficiency anemia, unspecified: Secondary | ICD-10-CM | POA: Diagnosis not present

## 2022-05-20 MED ORDER — SODIUM CHLORIDE 0.9 % IV SOLN
400.0000 mg | Freq: Once | INTRAVENOUS | Status: AC
  Administered 2022-05-20: 400 mg via INTRAVENOUS
  Filled 2022-05-20: qty 20

## 2022-05-20 MED ORDER — ACETAMINOPHEN 325 MG PO TABS: 650.0000 mg | ORAL_TABLET | Freq: Once | ORAL | Status: DC

## 2022-05-20 MED ORDER — SODIUM CHLORIDE 0.9 % IV SOLN: Freq: Once | INTRAVENOUS | Status: AC

## 2022-05-20 NOTE — Progress Notes (Signed)
Chaplain engaged in a follow-up visit with [place].  She voiced that she and her family are doing really well.  She has experienced being really tired and knowing when it is time for iron infusions.  Outside of battling with an iron deficiency, Monique Gomez finds great joy, energy, and strength from being with her family and traveling.  She talked about her travels last year and her hopes for the future.  Chaplain continues to offer a supportive presence, community, and listening.     05/20/22 1100  Spiritual Encounters  Type of Visit Follow up  Care provided to: Patient  Spiritual Framework  Presenting Themes Goals in life/care;Meaning/purpose/sources of inspiration;Impactful experiences and emotions  Community/Connection Family;Friend(s)  Goals  Self/Personal Goals More Traveling, Experiencing life  Interventions  Spiritual Care Interventions Made Established relationship of care and support;Compassionate presence;Reflective listening;Meaning making;Explored values/beliefs/practices/strengths  Intervention Outcomes  Outcomes Connection to spiritual care;Awareness around self/spiritual resourses;Awareness of support

## 2022-05-20 NOTE — Progress Notes (Signed)
Pt presents today for Venofer IV iron infusion per provider's order. Vital signs stable and pt voiced no new complaints at this time.  Pt took Tylenol at home prior to arrival. Peripheral IV started with good blood return pre and post infusion.  Venofer 400 mg given today per MD orders. Tolerated infusion without adverse affects. Vital signs stable. No complaints at this time. Discharged from clinic ambulatory in stable condition. Alert and oriented x 3. F/U with [facility] as scheduled.

## 2022-05-20 NOTE — Patient Instructions (Signed)
[place]  Discharge Instructions: Thank you for choosing [facility] to provide your oncology and hematology care.  If you have a lab appointment with the [facility], please come in thru the Main Entrance and check in at the main information desk.  Wear comfortable clothing and clothing appropriate for easy access to any Portacath or PICC line.   We strive to give you quality time with your provider. You may need to reschedule your appointment if you arrive late (15 or more minutes).  Arriving late affects you and other patients whose appointments are after yours.  Also, if you miss three or more appointments without notifying the office, you may be dismissed from the clinic at the provider's discretion.      For prescription refill requests, have your pharmacy contact our office and allow 72 hours for refills to be completed.    Today you received the following chemotherapy and/or immunotherapy agents Venofer IV iron     BELOW ARE SYMPTOMS THAT SHOULD BE REPORTED IMMEDIATELY: *FEVER GREATER THAN 100.4 F (38 C) OR HIGHER *CHILLS OR SWEATING *NAUSEA AND VOMITING THAT IS NOT CONTROLLED WITH YOUR NAUSEA MEDICATION *UNUSUAL SHORTNESS OF BREATH *UNUSUAL BRUISING OR BLEEDING *URINARY PROBLEMS (pain or burning when urinating, or frequent urination) *BOWEL PROBLEMS (unusual diarrhea, constipation, pain near the anus) TENDERNESS IN MOUTH AND THROAT WITH OR WITHOUT PRESENCE OF ULCERS (sore throat, sores in mouth, or a toothache) UNUSUAL RASH, SWELLING OR PAIN  UNUSUAL VAGINAL DISCHARGE OR ITCHING   Items with * indicate a potential emergency and should be followed up as soon as possible or go to the Emergency Department if any problems should occur.  Please show the CHEMOTHERAPY ALERT CARD or IMMUNOTHERAPY ALERT CARD at check-in to the Emergency Department and triage nurse.  Should you have questions after your visit or need to cancel or reschedule your  appointment, please contact [employer] [phone]  and follow the prompts.  Office hours are 8:00 a.m. to 4:30 p.m. Monday - Friday. Please note that voicemails left after 4:00 p.m. may not be returned until the following business day.  We are closed weekends and major holidays. You have access to a nurse at all times for urgent questions. Please call the main number to the clinic [phone] and follow the prompts.  For any non-urgent questions, you may also contact your provider using MyChart. We now offer e-Visits for anyone 44 and older to request care online for non-urgent symptoms. For details visit [email or web address].   Also download the MyChart app! Go to the app store, search "MyChart", open the app, select [place], and log in with your MyChart username and password.

## 2022-05-23 ENCOUNTER — Encounter: Payer: Self-pay | Admitting: Orthopedic Surgery

## 2022-05-23 ENCOUNTER — Ambulatory Visit: Payer: Medicare HMO | Admitting: Orthopedic Surgery

## 2022-05-23 DIAGNOSIS — M1811 Unilateral primary osteoarthritis of first carpometacarpal joint, right hand: Secondary | ICD-10-CM

## 2022-05-23 DIAGNOSIS — M1812 Unilateral primary osteoarthritis of first carpometacarpal joint, left hand: Secondary | ICD-10-CM | POA: Diagnosis not present

## 2022-05-23 MED ORDER — METHYLPREDNISOLONE ACETATE 40 MG/ML IJ SUSP
40.0000 mg | Freq: Once | INTRAMUSCULAR | Status: AC
  Administered 2022-05-23: 40 mg via INTRA_ARTICULAR

## 2022-05-23 NOTE — Patient Instructions (Signed)

## 2022-05-23 NOTE — Progress Notes (Signed)
FOLLOW UP   Encounter Diagnoses  Name Primary?   Arthritis of carpometacarpal [facility]) joint of left thumb Yes   Arthritis of carpometacarpal [facility]) joint of right thumb      Chief Complaint  Patient presents with   Hand Pain    Bilateral/ wants injections      PRIOR TREATMENT: 67 year old female has done well with injections in the past, last injection was in October 2022  Comes in with similar complaints of pain at the base of the carpometacarpal joint of both hands  Exam shows tenderness at the [facility] joint positive grind test bilaterally  The patient has requested to get repeat injections.  Procedure note injection left thumb  Injection left CMC joint Medication Depo-Medrol 40 mg and lidocaine 1% 2 cc The patient gave verbal consent Timeout confirmed the site of injection left CMC joint  Alcohol and ethyl chloride was used to repair the skin and then a 25-gauge needle was used to inject the [facility] joint of the left thumb  There were no complications a sterile Band-Aid was applied Procedure note injection right thumb  Injection right CMC joint Medication Depo-Medrol 40 mg and lidocaine 1% 2 cc The patient gave verbal consent Timeout confirmed the site of injection right CMC joint  Alcohol and ethyl chloride was used to repair the skin and then a 25-gauge needle was used to inject the [facility] joint of the right thumb  There were no complications a sterile Band-Aid was applied

## 2022-05-30 NOTE — Telephone Encounter (Signed)
Any updates on PA? Pt scheduled for injection on 06/15/22

## 2022-06-01 NOTE — Telephone Encounter (Signed)
Monique Gomez is calling. Botox 200 will be delivered on 2/6 with no signature required.

## 2022-06-02 ENCOUNTER — Ambulatory Visit: Payer: Medicare HMO | Admitting: Urology

## 2022-06-02 ENCOUNTER — Encounter: Payer: Self-pay | Admitting: Urology

## 2022-06-02 VITALS — BP 122/87 | HR 89

## 2022-06-02 DIAGNOSIS — N952 Postmenopausal atrophic vaginitis: Secondary | ICD-10-CM | POA: Diagnosis not present

## 2022-06-02 DIAGNOSIS — R1031 Right lower quadrant pain: Secondary | ICD-10-CM

## 2022-06-02 DIAGNOSIS — R102 Pelvic and perineal pain: Secondary | ICD-10-CM

## 2022-06-02 DIAGNOSIS — Z87440 Personal history of urinary (tract) infections: Secondary | ICD-10-CM | POA: Diagnosis not present

## 2022-06-02 NOTE — Telephone Encounter (Signed)
Monica- delivery was scheduled for Botox, can do you have any info on the auth? Thank you!

## 2022-06-02 NOTE — Progress Notes (Signed)
Subjective: 1. Personal history of urinary infection   2. Vaginal atrophy   3. Pelvic pain in female   4. Right lower quadrant abdominal pain      Consult requested by Marland Kitchen MD.  05/05/22: Monique Gomez is a 67 yo female who had UTI symptoms in October and was treated.  She had another episode in November and had Klebsiella that was treated with bactrim.  She was seen again in December with symptoms.  Other cultures showed Mx species. She will respond to the antibioitics.  She has no discharge.  She has no dysuria.  She has frequency but has a high fluid intake with water and cranberry juice.  She has nocturia 3-4x which is worse.  She has no incontinence.  She feels she empties well.  She has burning across her lower abdomen and into her sides.  She has pressure in the suprapubic area with sitting.  The pressure doesn't lateralized but can radiate to the mid lower back.  She had some UTI's several years ago.  She has no history of stones.  She has had no GU surgery.   She has had no hematuria.  She has no GI complaints.  The discomfort is worse with standing and is a dull pain.  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   06/02/22:  Miche returns today in f/u.  Her culture was negative and the CT showed no cause of the pain.   She has started the premarin cream and is voiding without complaints. She didn't get a UA today.    ROS:  Review of Systems  HENT:  Positive for congestion.   Respiratory:  Positive for shortness of breath.   Gastrointestinal:  Positive for heartburn.  Musculoskeletal:  Positive for back pain and joint pain.  Neurological:  Positive for headaches.  All other systems reviewed and are negative.   Allergies  Allergen Reactions   Venlafaxine Nausea And Vomiting and Other (See Comments)    Dizziness, shakiness    Past Medical History:  Diagnosis Date   Abdominal fibromatosis    Allergic rhinitis    Anemia    Asthma    Essential hypertension     GERD (gastroesophageal reflux disease)    History of cardiac catheterization    No significant CAD May 2015   History of migraine headaches    Hyperlipidemia    Iron deficiency anemia 02/05/2021   Myocarditis ([place])    a. diagnosed in 05/2020 with cath showing normal cors   Obesity    PAF (paroxysmal atrial fibrillation) ([place]) 08/2013   Sleep apnea    Type 2 diabetes mellitus [facility])     Past Surgical History:  Procedure Laterality Date   CATARACT EXTRACTION     CATARACT EXTRACTION W/PHACO Right 04/05/2019   Procedure: CATARACT EXTRACTION PHACO AND INTRAOCULAR LENS PLACEMENT RIGHT EYE (CDE: 3.19);  Surgeon: Baruch Goldmann, MD;  Location: [facility];  Service: Ophthalmology;  Laterality: Right;   COLONOSCOPY N/A 02/02/2015   SLF: 1. one colon polyp removed-no source for anemia identified. 2. moderate diverticulosis noted in the sigmoid colon and descending colon 3. the left colon is redundant 4. Rectal bleeding due ot small internal hemorroids 5. Moderate sized external hemorrhoids.   ESOPHAGOGASTRODUODENOSCOPY N/A 02/02/2015   SLF: 1. 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 large hiatal hernia 3. mild non-erosive gastritis 4. No source for anemia identified.    LEFT HEART CATH AND CORONARY ANGIOGRAPHY N/A 05/14/2020   Procedure: LEFT  HEART CATH AND CORONARY ANGIOGRAPHY;  Surgeon: Belva Crome, MD;  Location: [place] CV LAB;  Service: Cardiovascular;  Laterality: N/A;   LEFT HEART CATHETERIZATION WITH CORONARY ANGIOGRAM N/A 09/03/2013   Procedure: LEFT HEART CATHETERIZATION WITH CORONARY ANGIOGRAM;  Surgeon: Jettie Booze, MD;  Location: [facility] CATH LAB;  Service: Cardiovascular;  Laterality: N/A;   Right carpal tunnel release      Social History   Socioeconomic History   Marital status: Single    Spouse name: Not on file   Number of children: 2   Years of education: Not on file   Highest education level: Not on file  Occupational History   Occupation: Education officer, museum     Employer: [employer]  Tobacco Use   Smoking status: Former    Packs/day: 0.30    Types: Cigarettes    Quit date: 04/01/2012    Years since quitting: 10.1   Smokeless tobacco: Never  Vaping Use   Vaping Use: Never used  Substance and Sexual Activity   Alcohol use: Not Currently    Alcohol/week: 0.0 standard drinks of alcohol    Comment: very occasional   Drug use: No   Sexual activity: Not Currently    Birth control/protection: Post-menopausal  Other Topics Concern   Not on file  Social History Narrative   Not on file   Social Determinants of Health   Financial Resource Strain: Low Risk  (04/05/2022)   Overall Financial Resource Strain (CARDIA)    Difficulty of Paying Living Expenses: Not hard at all  Food Insecurity: No Food Insecurity (04/05/2022)   Hunger Vital Sign    Worried About Running Out of Food in the Last Year: Never true    [place] in the Last Year: Never true  Transportation Needs: No Transportation Needs (04/05/2022)   PRAPARE - Hydrologist (Medical): No    Lack of Transportation (Non-Medical): No  Physical Activity: Insufficiently Active (04/05/2022)   Exercise Vital Sign    Days of Exercise per Week: 2 days    Minutes of Exercise per Session: 10 min  Stress: No Stress Concern Present (04/05/2022)   [place]    Feeling of Stress : Not at all  Social Connections: Moderately Isolated (04/05/2022)   Social Connection and Isolation Panel [NHANES]    Frequency of Communication with Friends and Family: More than three times a week    Frequency of Social Gatherings with Friends and Family: More than three times a week    Attends Religious Services: 1 to 4 times per year    Active Member of [employer] or Organizations: No    Attends Archivist Meetings: Never    Marital Status: Never married  Intimate Partner Violence: Not At Risk (04/05/2022)    Humiliation, Afraid, Rape, and Kick questionnaire    Fear of Current or Ex-Partner: No    Emotionally Abused: No    Physically Abused: No    Sexually Abused: No    Family History  Problem Relation Age of Onset   Diabetes Mother    Hypertension Mother    Heart disease Mother    High Cholesterol Mother    Alzheimer's disease Father    Hypertension Sister    Hypertension Brother    Diabetes Brother    Arthritis Sister    Hypertension Sister    High Cholesterol Sister    Diabetes Son  Developmental delay Son    Colon cancer Neg Hx     Anti-infectives: Anti-infectives (From admission, onward)    None       Current Outpatient Medications  Medication Sig Dispense Refill   acetaminophen (TYLENOL) 500 MG tablet Take 1 tablet (500 mg total) by mouth every 6 (six) hours as needed. 30 tablet 0   albuterol (VENTOLIN HFA) 108 (90 Base) MCG/ACT inhaler Inhale 2 puffs into the lungs every 6 (six) hours as needed for wheezing or shortness of breath. 18 g 0   atorvastatin (LIPITOR) 80 MG tablet Take 1 tablet by mouth once daily 90 tablet 3   blood glucose meter kit and supplies KIT Dispense based on patient and insurance preference. Use up to four times daily as directed. 1 each 0   botulinum toxin Type A (BOTOX) 200 units injection Inject 155 Units into the muscle every 3 (three) months. 1 each 2   conjugated estrogens (PREMARIN) vaginal cream Apply 0.5 gm which is a pea-sized amount with the applicator or fingertip vaginally at bedtime for 2 weeks and then 2-3 x weekly. 42.5 g 12   cyclobenzaprine (FLEXERIL) 10 MG tablet One tablet every twelve hours as needed for spasm. 60 tablet 2   ELIQUIS 5 MG TABS tablet Take 1 tablet by mouth twice daily 180 tablet 0   fluticasone (FLONASE) 50 MCG/ACT nasal spray Place 2 sprays into both nostrils daily. 16 g 5   Galcanezumab-gnlm (EMGALITY) 120 MG/ML SOAJ INJECT 120 MG  SUBCUTANEOUSLY ONCE EVERY 30 DAYS 1 mL 11   HYDROcodone-acetaminophen  (NORCO/VICODIN) 5-325 MG tablet One tablet every four hours as needed for acute pain.  Limit of five days per [place] statue. 30 tablet 0   ipratropium (ATROVENT) 0.03 % nasal spray Place 2 sprays into both nostrils every 12 (twelve) hours. 30 mL 3   Iron-FA-B Cmp-C-Biot-Probiotic (FUSION PLUS) CAPS Take 1 capsule by mouth daily. 30 capsule 11   JARDIANCE 10 MG TABS tablet TAKE 1 TABLET BY MOUTH ONCE DAILY BEFORE BREAKFAST 90 tablet 3   meclizine (ANTIVERT) 25 MG tablet Take 1 tablet (25 mg total) by mouth 3 (three) times daily as needed for dizziness. 30 tablet 0   metFORMIN (GLUCOPHAGE) 500 MG tablet TAKE 1 TABLET BY MOUTH TWICE DAILY WITH A MEAL 180 tablet 0   metoCLOPramide (REGLAN) 10 MG tablet Take 1 tablet (10 mg total) by mouth every 8 (eight) hours as needed for up to 7 days for nausea (Headache). 21 tablet 0   metoprolol succinate (TOPROL-XL) 50 MG 24 hr tablet TAKE 1 TABLET BY MOUTH  IN THE MORNING AND AT BEDTIME. TAKE WITH OR IMMEDIATELY FOLLOWING A MEAL 180 tablet 3   pantoprazole (PROTONIX) 40 MG tablet Take 1 tablet by mouth once daily 90 tablet 0   sacubitril-valsartan (ENTRESTO) 97-103 MG Take 1 tablet by mouth 2 (two) times daily. 180 tablet 3   topiramate (TOPAMAX) 50 MG tablet Take 2 tablets (100 mg total) by mouth at bedtime. 180 tablet 3   Ubrogepant (UBRELVY) 50 MG TABS TAKE ONE TABLET BY MOUTH AT THE ONSET OF A MIGRAINE. CAN REPEAT IN 2 HOURS IF NEEDED 10 tablet 11   No current facility-administered medications for this visit.     Objective: Vital signs in last 24 hours: BP 122/87   Pulse 89   Intake/Output from previous day: No intake/output data recorded. Intake/Output this shift: '@IOTHISSHIFT'$ @   Physical Exam Vitals reviewed.  Constitutional:      Appearance: Normal  appearance.  Neurological:     Mental Status: She is alert.     Lab Results:  No results found for this or any previous visit (from the past 24 hour(s)).   BMET No results for input(s):  "NA", "K", "CL", "CO2", "GLUCOSE", "BUN", "CREATININE", "CALCIUM" in the last 72 hours. PT/INR No results for input(s): "LABPROT", "INR" in the last 72 hours. ABG No results for input(s): "PHART", "HCO3" in the last 72 hours.  Invalid input(s): "PCO2", "PO2" Recent Results (from the past 2160 hour(s))  CBC with Differential/Platelet     Status: Abnormal   Collection Time: 03/09/22 12:53 PM  Result Value Ref Range   WBC 10.8 (H) 4.0 - 10.5 K/uL   RBC 5.49 (H) 3.87 - 5.11 MIL/uL   Hemoglobin 11.8 (L) 12.0 - 15.0 g/dL   HCT 40.3 36.0 - 46.0 %   MCV 73.4 (L) 80.0 - 100.0 fL   MCH 21.5 (L) 26.0 - 34.0 pg   MCHC 29.3 (L) 30.0 - 36.0 g/dL   RDW 16.3 (H) 11.5 - 15.5 %   Platelets 333 150 - 400 K/uL   nRBC 0.0 0.0 - 0.2 %   Neutrophils Relative % 70 %   Neutro Abs 7.6 1.7 - 7.7 K/uL   Lymphocytes Relative 20 %   Lymphs Abs 2.2 0.7 - 4.0 K/uL   Monocytes Relative 6 %   Monocytes Absolute 0.7 0.1 - 1.0 K/uL   Eosinophils Relative 2 %   Eosinophils Absolute 0.3 0.0 - 0.5 K/uL   Basophils Relative 1 %   Basophils Absolute 0.1 0.0 - 0.1 K/uL   Immature Granulocytes 1 %   Abs Immature Granulocytes 0.05 0.00 - 0.07 K/uL    Comment: Performed at [facility], [address], [place], [place] [address]  Iron and TIBC     Status: None   Collection Time: 03/09/22 12:53 PM  Result Value Ref Range   Iron 70 28 - 170 ug/dL   TIBC 328 250 - 450 ug/dL   Saturation Ratios 21 10.4 - 31.8 %   UIBC 258 ug/dL    Comment: Performed at [facility], [address] [place], [place] [address]  Ferritin     Status: None   Collection Time: 03/09/22 12:53 PM  Result Value Ref Range   Ferritin 22 11 - 307 ng/mL    Comment: Performed at [facility], [address], [place], [place] [address]  Hgb Fractionation Cascade     Status: None   Collection Time: 03/09/22 12:53 PM  Result Value Ref Range   Hgb F 0.0 0.0 - 2.0 %   Hgb A 97.5 96.4 - 98.8 %   Hgb A2 2.5 1.8 - 3.2 %   Hgb S 0.0 0.0 %    Interpretation, Hgb Fract Comment     Comment: (NOTE) Normal hemoglobin present; no hemoglobin variant or beta thalassemia identified. Note: Alpha thalassemia may not be detected by the Hgb Fractionation Cascade panel. If alpha thalassemia is suspected, [facility] offers Alpha-Thalassemia DNA Analysis [id number] Performed At: [facility] [place], [place] [id number] Rush Farmer MD [id number]   POCT urinalysis dipstick     Status: Abnormal   Collection Time: 03/15/22  1:51 PM  Result Value Ref Range   Color, UA orange (A) yellow   Clarity, UA clear clear   Glucose, UA >=1,000 (A) negative mg/dL   Bilirubin, UA moderate (A) negative   Ketones, POC UA small (15) (A) negative mg/dL   Spec Grav,  UA 1.015 1.010 - 1.025   Blood, UA negative negative   pH, UA 5.0 5.0 - 8.0   Protein Ur, POC =100 (A) negative mg/dL   Urobilinogen, UA 4.0 (A) 0.2 or 1.0 E.U./dL   Nitrite, UA Positive (A) Negative   Leukocytes, UA Large (3+) (A) Negative  Urine Culture     Status: Abnormal   Collection Time: 03/15/22  2:00 PM   Specimen: Urine, Clean Catch  Result Value Ref Range   Specimen Description      URINE, CLEAN CATCH Performed at [facility], [address] [place], [place] [address]    Special Requests      NONE Performed at [facility], [address] [place], [place] [address]    Culture 30,000 COLONIES/mL KLEBSIELLA PNEUMONIAE (A)    Report Status 03/18/2022 FINAL    Organism ID, Bacteria KLEBSIELLA PNEUMONIAE (A)       Susceptibility   Klebsiella pneumoniae - MIC*    AMPICILLIN >=32 RESISTANT Resistant     CEFAZOLIN <=4 SENSITIVE Sensitive     CEFEPIME <=0.12 SENSITIVE Sensitive     CEFTRIAXONE <=0.25 SENSITIVE Sensitive     CIPROFLOXACIN <=0.25 SENSITIVE Sensitive     GENTAMICIN <=1 SENSITIVE Sensitive     IMIPENEM <=0.25 SENSITIVE Sensitive     NITROFURANTOIN 64 INTERMEDIATE Intermediate     TRIMETH/SULFA <=20 SENSITIVE Sensitive      AMPICILLIN/SULBACTAM 4 SENSITIVE Sensitive     PIP/TAZO <=4 SENSITIVE Sensitive     * 30,000 COLONIES/mL KLEBSIELLA PNEUMONIAE  POCT URINALYSIS DIP (CLINITEK)     Status: Abnormal   Collection Time: 03/31/22 10:06 AM  Result Value Ref Range   Color, UA yellow yellow   Clarity, UA clear clear   Glucose, UA >=1,000 (A) negative mg/dL   Bilirubin, UA negative negative   Ketones, POC UA negative negative mg/dL   Spec Grav, UA 1.020 1.010 - 1.025   Blood, UA negative negative   pH, UA 6.0 5.0 - 8.0   POC PROTEIN,UA negative negative, trace   Urobilinogen, UA 0.2 0.2 or 1.0 E.U./dL   Nitrite, UA Negative Negative   Leukocytes, UA Negative Negative  Urine Culture     Status: None   Collection Time: 04/01/22 11:37 AM   Specimen: Urine   UR  Result Value Ref Range   Urine Culture, Routine Final report    Organism ID, Bacteria Comment     Comment: Mixed urogenital flora 10,000-25,000 colony forming units per mL   Cytology - PAP     Status: None   Collection Time: 04/05/22  2:32 PM  Result Value Ref Range   High risk HPV Negative    Neisseria Gonorrhea Negative    Chlamydia Negative    Trichomonas Negative    Adequacy      Satisfactory for evaluation; transformation zone component PRESENT.   Diagnosis      - Negative for intraepithelial lesion or malignancy (NILM)   Comment Normal Reference Range HPV - Negative    Comment Normal Reference Range Trichomonas - Negative    Comment Normal Reference [place] Chlamydia - Negative    Comment      Normal Reference Range Neisseria Gonorrhea - Negative  Urinalysis, Routine w reflex microscopic     Status: Abnormal   Collection Time: 05/05/22  8:54 AM  Result Value Ref Range   Specific Gravity, UA 1.020 1.005 - 1.030   pH, UA 5.5 5.0 - 7.5   Color, UA Yellow Yellow   Appearance Ur  Cloudy (A) Clear   Leukocytes,UA 1+ (A) Negative   Protein,UA 2+ (A) Negative/Trace   Glucose, UA 3+ (A) Negative   Ketones, UA Trace (A) Negative   RBC, UA  Negative Negative   Bilirubin, UA Negative Negative   Urobilinogen, Ur 1.0 0.2 - 1.0 mg/dL   Nitrite, UA Negative Negative   Microscopic Examination See below:     Comment: Microscopic was indicated and was performed.  Microscopic Examination     Status: None   Collection Time: 05/05/22  8:54 AM   Urine  Result Value Ref Range   WBC, UA 0-5 0 - 5 /hpf   RBC, Urine 0-2 0 - 2 /hpf   Epithelial Cells (non renal) 0-10 0 - 10 /hpf   Bacteria, UA Few None seen/Few  Urine Culture     Status: None   Collection Time: 05/05/22  9:42 AM   Specimen: Urine, Catheterized   UR  Result Value Ref Range   Urine Culture, Routine Final report    Organism ID, Bacteria Comment     Comment: Mixed urogenital flora 10,000-25,000 colony forming units per mL   VITAMIN D 25 Hydroxy (Vit-D Deficiency, Fractures)     Status: Abnormal   Collection Time: 05/16/22  9:01 AM  Result Value Ref Range   Vit D, 25-Hydroxy 20.2 (L) 30.0 - 100.0 ng/mL    Comment: Vitamin D deficiency has been defined by the Institute of Medicine and an Endocrine Society practice guideline as a level of serum 25-OH vitamin D less than 20 ng/mL (1,2). The Endocrine Society went on to further define vitamin D insufficiency as a level between 21 and 29 ng/mL (2). 1. IOM (Institute of Medicine). 2010. Dietary reference    intakes for calcium and D. [place]: The    [employer]. 2. Holick MF, Binkley [place], Bischoff-Ferrari HA, et al.    Evaluation, treatment, and prevention of vitamin D    deficiency: an Endocrine Society clinical practice    guideline. JCEM. 2011 Jul; 96(7):1911-30.   TSH     Status: None   Collection Time: 05/16/22  9:01 AM  Result Value Ref Range   TSH 3.590 0.450 - 4.500 uIU/mL  Lipid panel     Status: Abnormal   Collection Time: 05/16/22  9:01 AM  Result Value Ref Range   Cholesterol, Total 174 100 - 199 mg/dL   Triglycerides 106 0 - 149 mg/dL   HDL 39 (L) >39 mg/dL   VLDL Cholesterol Cal 19  5 - 40 mg/dL   LDL Chol Calc (NIH) 116 (H) 0 - 99 mg/dL   Chol/HDL Ratio 4.5 (H) 0.0 - 4.4 ratio    Comment:                                   T. Chol/HDL Ratio                                             Men  Women                               1/2 Avg.Risk  3.4    3.3  Avg.Risk  5.0    4.4                                2X Avg.Risk  9.6    7.1                                3X Avg.Risk 23.4   11.0   Hemoglobin A1c     Status: Abnormal   Collection Time: 05/16/22  9:01 AM  Result Value Ref Range   Hgb A1c MFr Bld 6.1 (H) 4.8 - 5.6 %    Comment:          Prediabetes: 5.7 - 6.4          Diabetes: >6.4          Glycemic control for adults with diabetes: <7.0    Est. average glucose Bld gHb Est-mCnc 128 mg/dL  CMP14+EGFR     Status: Abnormal   Collection Time: 05/16/22  9:01 AM  Result Value Ref Range   Glucose 106 (H) 70 - 99 mg/dL   BUN 15 8 - 27 mg/dL   Creatinine, Ser 0.88 0.57 - 1.00 mg/dL   eGFR 73 >59 mL/min/1.73   BUN/Creatinine Ratio 17 12 - 28   Sodium 145 (H) 134 - 144 mmol/L   Potassium 3.9 3.5 - 5.2 mmol/L   Chloride 109 (H) 96 - 106 mmol/L   CO2 19 (L) 20 - 29 mmol/L   Calcium 10.0 8.7 - 10.3 mg/dL   Total Protein 6.8 6.0 - 8.5 g/dL   Albumin 4.1 3.9 - 4.9 g/dL   Globulin, Total 2.7 1.5 - 4.5 g/dL   Albumin/Globulin Ratio 1.5 1.2 - 2.2   Bilirubin Total <0.2 0.0 - 1.2 mg/dL   Alkaline Phosphatase 106 44 - 121 IU/L   AST 12 0 - 40 IU/L   ALT 12 0 - 32 IU/L  CBC with Differential/Platelet     Status: Abnormal   Collection Time: 05/16/22  9:01 AM  Result Value Ref Range   WBC 9.1 3.4 - 10.8 x10E3/uL   RBC 5.71 (H) 3.77 - 5.28 x10E6/uL   Hemoglobin 12.6 11.1 - 15.9 g/dL   Hematocrit 41.3 34.0 - 46.6 %   MCV 72 (L) 79 - 97 fL   MCH 22.1 (L) 26.6 - 33.0 pg   MCHC 30.5 (L) 31.5 - 35.7 g/dL   RDW 16.1 (H) 11.7 - 15.4 %   Platelets 311 150 - 450 x10E3/uL   Neutrophils 73 Not Estab. %   Lymphs 19 Not Estab. %   Monocytes 6 Not  Estab. %   Eos 2 Not Estab. %   Basos 0 Not Estab. %   Neutrophils Absolute 6.6 1.4 - 7.0 x10E3/uL   Lymphocytes Absolute 1.7 0.7 - 3.1 x10E3/uL   Monocytes Absolute 0.5 0.1 - 0.9 x10E3/uL   EOS (ABSOLUTE) 0.2 0.0 - 0.4 x10E3/uL   Basophils Absolute 0.0 0.0 - 0.2 x10E3/uL   Immature Granulocytes 0 Not Estab. %   Immature Grans (Abs) 0.0 0.0 - 0.1 x10E3/uL  Urine Microalbumin w/creat. ratio     Status: None   Collection Time: 05/16/22  9:20 AM  Result Value Ref Range   Creatinine, Urine 348.5 Not Estab. mg/dL   Microalbumin, Urine 95.4 Not Estab. ug/mL   Microalb/Creat Ratio 27 0 - 29 mg/g creat    Comment:  Normal:                0 -  29                        Moderately increased: 30 - 300                        Severely increased:       >300     Studies/Results: No results found. CT RENAL STONE STUDY  Result Date: 05/20/2022 CLINICAL DATA:  Abdominal/flank pain, stone suspected, Right lower quadrant abdominal painAbdominal/flank pain, stone suspected EXAM: CT ABDOMEN AND PELVIS WITHOUT CONTRAST TECHNIQUE: Multidetector CT imaging of the abdomen and pelvis was performed following the standard protocol without IV contrast. RADIATION DOSE REDUCTION: This exam was performed according to the departmental dose-optimization program which includes automated exposure control, adjustment of the mA and/or kV according to patient size and/or use of iterative reconstruction technique. COMPARISON:  None Available. FINDINGS: Lower chest: Lung bases are clear. Hepatobiliary: No focal hepatic lesion. Normal gallbladder. No biliary duct dilatation. Common bile duct is normal. Pancreas: Pancreas is normal. No ductal dilatation. No pancreatic inflammation. Spleen: Normal spleen Adrenals/urinary tract: Adrenal glands normal. No nephrolithiasis or ureterolithiasis. No obstructive uropathy. No bladder calculi. Stomach/Bowel: Moderate size hiatal hernia. Stomach, duodenum small-bowel  normal. Appendix normal. Vascular/Lymphatic: Abdominal aorta is normal caliber with atherosclerotic calcification. There is no retroperitoneal or periportal lymphadenopathy. No pelvic lymphadenopathy. Reproductive: Uterus and adnexa unremarkable. Other: No free fluid. Musculoskeletal: No aggressive osseous lesion. IMPRESSION: 1. No nephrolithiasis or ureterolithiasis. No obstructive uropathy. 2. Normal appendix. 3. Moderate size hiatal hernia. 4.  Aortic Atherosclerosis (ICD10-I70.0). Electronically Signed   By: Suzy Bouchard M.D.   On: 05/20/2022 09:41   MM 3D SCREEN BREAST BILATERAL  Result Date: 03/16/2022 CLINICAL DATA:  Screening. EXAM: DIGITAL SCREENING BILATERAL MAMMOGRAM WITH TOMOSYNTHESIS AND CAD TECHNIQUE: Bilateral screening digital craniocaudal and mediolateral oblique mammograms were obtained. Bilateral screening digital breast tomosynthesis was performed. The images were evaluated with computer-aided detection. COMPARISON:  Previous exam(s). ACR Breast Density Category a: The breast tissue is almost entirely fatty. FINDINGS: There are no findings suspicious for malignancy. IMPRESSION: No mammographic evidence of malignancy. A result letter of this screening mammogram will be mailed directly to the patient. RECOMMENDATION: Screening mammogram in one year. (Code:[id number]-B-01Y) BI-RADS CATEGORY  1: Negative. Electronically Signed   By: Valentino Saxon M.D.   On: 03/16/2022 07:33   DG Bone Density  Result Date: 03/14/2022 EXAM: DUAL X-RAY ABSORPTIOMETRY (DXA) FOR BONE MINERAL DENSITY IMPRESSION: Your patient Leler Brion completed a BMD test on 03/14/2022 using the [employer] (software version: 14.10) manufactured by [employer]. The following summarizes the results of our evaluation. Technologist: [facility] PATIENT BIOGRAPHICAL: Name: Ryane, Konieczny Patient ID: [id number] Birth Date: 1955/12/04 Height: 62.0 in. Gender: Female Exam Date: 03/14/2022 Weight: 274.0 lbs.  Indications: Low Calcium Intake, Post Menopausal Fractures: Treatments DENSITOMETRY RESULTS: Site         Region     Measured Date Measured Age WHO Classification Young Adult T-score BMD         %Change vs. Previous Significant Change (*) DualFemur Neck Left 03/14/2023 65.4 Osteopenia -1.8 0.783 g/cm2 - - DualFemur Total Mean 03/14/2022 65.4 Normal -0.9 0.893 g/cm2 - - Left Forearm Radius 33% 03/14/2022 65.4 Normal -0.2 0.699 g/cm2 - - ASSESSMENT: The BMD measured at Femur Neck Left is 0.783 g/cm2 with a T-score of -  1.8. This patient is considered osteopenic according to [facility]) criteria. The scan quality is good. Lumbar spine was excluded due to advanced degenerative changes. [facility]) criteria for post-menopausal, Caucasian Women: Normal:       T-score at or above -1 SD Osteopenia:   T-score between -1 and -2.5 SD Osteoporosis: T-score at or below -2.5 SD RECOMMENDATIONS: 1. All patients should optimize calcium and vitamin D intake. 2. Consider FDA-approved medical therapies in postmenopausal women and med aged 63 years and older, based on the following: a. A hip or vertebral (clinical or morphometric) fracture b. T-score< -2.5 at the femoral neck or spine after appropriate evaluation to exclude secondary causes c. Low bone mass (T-score between -1.0 and -2.5 at the femoral neck or spine) and a 10-year probability of a hip fracture > 3% or a 10-year probability of a major osteoporosis-related fracture > 20% based on the [place]-adapted WHO algorithm d. Clinician judgment and/or patient preferences may indicate treatment for people with 10-year fracture probabilities above or below these levels FOLLOW-UP: Patients with diagnosis of osteoporosis or at high risk for fracture should have regular bone mineral density tests. For patients eligible for Medicare, routine testing is allowed once every 2 years. The testing frequency can be increased to one year for patients who have  rapidly progressing disease, those who are receiving or discontinuing medical therapy to restore bone mass, or have additional risk factors. I have reviewed this report, and agree with the above findings. Mark A. Thornton Papas, M.D. [facility] Your patient MEGEN MADEWELL completed a FRAX assessment on 03/14/2022 using the [place] (analysis version: 14.10) manufactured by [employer]. The following summarizes the results of our evaluation. PATIENT BIOGRAPHICAL: Name: Aliviana, Burdell Patient ID: [id number] Birth Date: [birth year] Height:    62.0 in. Gender:     Female    [age]       Weight:    274.0 lbs. Ethnicity:  Black                            Exam Date: 03/14/2022 FRAX* RESULTS:  (version: 3.5) 10-year Probability of Fracture1 Major Osteoporotic Fracture2 Hip Fracture 3.6% 0.4% Population: [place] (Black) Risk Factors: None Based on Femur (Left) Neck BMD 1 -The 10-year probability of fracture may be lower than reported if the patient has received treatment. 2 -Major Osteoporotic Fracture: Clinical Spine, Forearm, Hip or Shoulder *FRAX is a Materials engineer of the [employer] for Metabolic Bone Disease, a [place] (WHO) [facility]. ASSESSMENT: The probability of a major osteoporotic fracture is 3.6% within the next ten years. The probability of a hip fracture is 0.4% within the next ten years. I have reviewed this report and agree with the above findings. Mark A. Thornton Papas, M.D. [facility] Electronically Signed   By: Lavonia Dana M.D.   On: 03/14/2022 09:57   XR C-ARM NO REPORT  Result Date: 03/07/2022 Please see Notes tab for imaging impression.  Epidural Steroid injection  Result Date: 03/07/2022 Magnus Sinning, MD     03/15/2022  9:10 AM Lumbar Epidural Steroid Injection - Interlaminar Approach with Fluoroscopic Guidance Patient: Monique Gomez     Date of Birth: 1955-10-22 MRN: [id number] PCP: Lindell Spar, MD     Visit Date: 03/07/2022  Universal Protocol:   Consent Given By: the patient Position:  PRONE Additional Comments: Vital signs were monitored before and after the procedure. Patient was prepped and draped in the usual sterile fashion. The correct patient, procedure, and site was verified. Injection Procedure Details: Procedure diagnoses: Lumbar radiculopathy [M54.16] Meds Administered: Meds ordered this encounter Medications  methylPREDNISolone acetate (DEPO-MEDROL) injection 40 mg  Laterality: Right Location/Site:  L4-5 Needle: 3.5 in., 20 ga. Tuohy Needle Placement: Paramedian epidural Findings:  -Comments: Excellent flow of contrast into the epidural space. Procedure Details: Using a paramedian approach from the side mentioned above, the region overlying the inferior lamina was localized under fluoroscopic visualization and the soft tissues overlying this structure were infiltrated with 4 ml. of 1% Lidocaine without Epinephrine. The Tuohy needle was inserted into the epidural space using a paramedian approach. The epidural space was localized using loss of resistance along with counter oblique bi-planar fluoroscopic views.  After negative aspirate for air, blood, and CSF, a 2 ml. volume of Isovue-250 was injected into the epidural space and the flow of contrast was observed. Radiographs were obtained for documentation purposes.  The injectate was administered into the level noted above. Additional Comments: The patient tolerated the procedure well Dressing: 2 x 2 sterile gauze and Band-Aid  Post-procedure details: Patient was observed during the procedure. Post-procedure instructions were reviewed. Patient left the clinic in stable condition.    Assessment/Plan: History of UTI's.  Her culture from 1/4 had a small growth of Mx species.   Lower abdominal and right flank pain/burning.  The CT showed no cause for the pain.  She will proceed with PT.   Vaginal atrophy.  She will continue the  premarin cream and return in 6 moths.   No orders of the defined types were placed in this encounter.    No orders of the defined types were placed in this encounter.    Return in about 6 months (around 12/01/2022).    CC: Dr. Marland Kitchen.     Irine Seal 06/03/2022

## 2022-06-04 ENCOUNTER — Other Ambulatory Visit: Payer: Self-pay | Admitting: Internal Medicine

## 2022-06-04 DIAGNOSIS — J4520 Mild intermittent asthma, uncomplicated: Secondary | ICD-10-CM

## 2022-06-06 NOTE — Telephone Encounter (Signed)
Any updates on auth? Pt has upcoming appointment on 06/15/22

## 2022-06-07 ENCOUNTER — Encounter (HOSPITAL_COMMUNITY): Payer: Self-pay | Admitting: Hematology

## 2022-06-07 ENCOUNTER — Other Ambulatory Visit (HOSPITAL_COMMUNITY): Payer: Self-pay

## 2022-06-07 ENCOUNTER — Other Ambulatory Visit: Payer: Self-pay | Admitting: Internal Medicine

## 2022-06-07 NOTE — Telephone Encounter (Signed)
BOTOXONE-Benefit Verification [id number] Submitted!

## 2022-06-07 NOTE — Telephone Encounter (Signed)
Pharmacy Patient Advocate Encounter  Prior Authorization for Botox 200UNIT solution has been approved.    PA# PA Case ID: [id number] Effective dates: 05/02/2022 through 05/02/2023

## 2022-06-07 NOTE — Telephone Encounter (Signed)
Approved to fill through Pharmacy benefit

## 2022-06-15 ENCOUNTER — Encounter: Payer: Self-pay | Admitting: Neurology

## 2022-06-15 ENCOUNTER — Ambulatory Visit: Payer: Medicare HMO | Admitting: Neurology

## 2022-06-15 VITALS — BP 129/90 | HR 81

## 2022-06-15 DIAGNOSIS — G43709 Chronic migraine without aura, not intractable, without status migrainosus: Secondary | ICD-10-CM | POA: Diagnosis not present

## 2022-06-15 MED ORDER — ONABOTULINUMTOXINA 200 UNITS IJ SOLR
200.0000 [IU] | Freq: Once | INTRAMUSCULAR | Status: AC
  Administered 2022-06-15: 200 [IU] via INTRAMUSCULAR

## 2022-06-15 NOTE — Progress Notes (Signed)
     BOTOX PROCEDURE NOTE FOR MIGRAINE HEADACHE   HISTORY: Here for her third Botox injection.  She has had excellent benefit.  She reports at least 75% improvement.  She remains on Emgality and max.  She has not needed the Ubrelvy.  She is quite pleased.   Description of procedure:  The patient was placed in a sitting position. The standard protocol was used for Botox as follows, with 5 units of Botox injected at each site:   -Procerus muscle, midline injection  -Corrugator muscle, bilateral injection  -Frontalis muscle, bilateral injection, with 2 sites each side, medial injection was performed in the upper one third of the frontalis muscle, in the region vertical from the medial inferior edge of the superior orbital rim. The lateral injection was again in the upper one third of the forehead vertically above the lateral limbus of the cornea, 1.5 cm lateral to the medial injection site.  -Temporalis muscle injection, 4 sites, bilaterally. The first injection was 3 cm above the tragus of the ear, second injection site was 1.5 cm to 3 cm up from the first injection site in line with the tragus of the ear. The third injection site was 1.5-3 cm forward between the first 2 injection sites. The fourth injection site was 1.5 cm posterior to the second injection site.  -Occipitalis muscle injection, 3 sites, bilaterally. The first injection was done one half way between the occipital protuberance and the tip of the mastoid process behind the ear. The second injection site was done lateral and superior to the first, 1 fingerbreadth from the first injection. The third injection site was 1 fingerbreadth superiorly and medially from the first injection site.  -Cervical paraspinal muscle injection, 2 sites, bilateral, the first injection site was 1 cm from the midline of the cervical spine, 3 cm inferior to the lower border of the occipital protuberance. The second injection site was 1.5 cm superiorly  and laterally to the first injection site.  -Trapezius muscle injection was performed at 3 sites, bilaterally. The first injection site was in the upper trapezius muscle halfway between the inflection point of the neck, and the acromion. The second injection site was one half way between the acromion and the first injection site. The third injection was done between the first injection site and the inflection point of the neck.   A 200 unit bottle of Botox was used, 155 units were injected, the rest of the Botox was wasted. The patient tolerated the procedure well, there were no complications of the above procedure.  Botox NDC 0488-8916-94 Lot number [id number] Expiration date 09/2024 SP

## 2022-06-15 NOTE — Progress Notes (Signed)
Botox 200 units x 1 vial [id number] Exp-09/2024 SP

## 2022-06-16 ENCOUNTER — Other Ambulatory Visit (HOSPITAL_COMMUNITY): Payer: Self-pay

## 2022-06-27 ENCOUNTER — Other Ambulatory Visit: Payer: Self-pay | Admitting: Internal Medicine

## 2022-06-27 DIAGNOSIS — J4520 Mild intermittent asthma, uncomplicated: Secondary | ICD-10-CM

## 2022-06-30 ENCOUNTER — Encounter: Payer: Self-pay | Admitting: Radiology

## 2022-07-06 ENCOUNTER — Ambulatory Visit (HOSPITAL_COMMUNITY): Payer: Medicare HMO

## 2022-07-15 ENCOUNTER — Ambulatory Visit: Payer: Medicare HMO | Admitting: Cardiology

## 2022-07-20 ENCOUNTER — Other Ambulatory Visit: Payer: Self-pay | Admitting: Internal Medicine

## 2022-07-21 ENCOUNTER — Encounter (HOSPITAL_COMMUNITY): Payer: Self-pay | Admitting: Hematology

## 2022-07-24 ENCOUNTER — Other Ambulatory Visit: Payer: Self-pay | Admitting: Internal Medicine

## 2022-07-24 DIAGNOSIS — J4520 Mild intermittent asthma, uncomplicated: Secondary | ICD-10-CM

## 2022-07-27 ENCOUNTER — Other Ambulatory Visit: Payer: Self-pay | Admitting: Cardiology

## 2022-07-28 ENCOUNTER — Ambulatory Visit (HOSPITAL_COMMUNITY): Payer: Medicare HMO | Admitting: Physical Therapy

## 2022-08-09 ENCOUNTER — Other Ambulatory Visit: Payer: Self-pay | Admitting: Internal Medicine

## 2022-08-09 ENCOUNTER — Telehealth: Payer: Self-pay | Admitting: Neurology

## 2022-08-09 NOTE — Telephone Encounter (Signed)
Pt called to schedule Botox. Stated she needs Botox in May, there are currently no slots available.

## 2022-08-10 ENCOUNTER — Encounter (HOSPITAL_COMMUNITY): Payer: Self-pay | Admitting: Hematology

## 2022-08-10 NOTE — Telephone Encounter (Signed)
Scheduled for 09/27/22 at 3:30 pm.

## 2022-08-13 ENCOUNTER — Other Ambulatory Visit: Payer: Self-pay | Admitting: Internal Medicine

## 2022-08-18 ENCOUNTER — Other Ambulatory Visit: Payer: Self-pay | Admitting: Internal Medicine

## 2022-08-18 DIAGNOSIS — J4520 Mild intermittent asthma, uncomplicated: Secondary | ICD-10-CM

## 2022-08-24 ENCOUNTER — Other Ambulatory Visit: Payer: Self-pay | Admitting: Neurology

## 2022-08-24 DIAGNOSIS — G43709 Chronic migraine without aura, not intractable, without status migrainosus: Secondary | ICD-10-CM

## 2022-08-25 ENCOUNTER — Ambulatory Visit: Payer: Medicare HMO | Attending: Cardiology | Admitting: Cardiology

## 2022-08-25 ENCOUNTER — Encounter: Payer: Self-pay | Admitting: Cardiology

## 2022-08-25 ENCOUNTER — Other Ambulatory Visit: Payer: Self-pay | Admitting: *Deleted

## 2022-08-25 VITALS — BP 124/84 | HR 80 | Ht 62.0 in | Wt 264.2 lb

## 2022-08-25 DIAGNOSIS — I10 Essential (primary) hypertension: Secondary | ICD-10-CM

## 2022-08-25 DIAGNOSIS — I480 Paroxysmal atrial fibrillation: Secondary | ICD-10-CM

## 2022-08-25 DIAGNOSIS — I422 Other hypertrophic cardiomyopathy: Secondary | ICD-10-CM | POA: Diagnosis not present

## 2022-08-25 DIAGNOSIS — Z8679 Personal history of other diseases of the circulatory system: Secondary | ICD-10-CM | POA: Diagnosis not present

## 2022-08-25 DIAGNOSIS — G43709 Chronic migraine without aura, not intractable, without status migrainosus: Secondary | ICD-10-CM

## 2022-08-25 MED ORDER — BOTOX 200 UNITS IJ SOLR: INTRAMUSCULAR | 2 refills | Status: DC

## 2022-08-25 NOTE — Progress Notes (Signed)
    Cardiology Office Note  Date: 08/25/2022   ID: Monique Gomez, Monique Gomez 11-08-1955, MRN [id number]  History of Present Illness: Monique Gomez is a 67 y.o. female last seen in September 2023 by Ms. Strader PA-C, I reviewed the note.  She is here for a follow-up visit.  States that she has been doing fairly well.  Describes NYHA class II dyspnea, no exertional chest pain, no palpitations or syncope.  She has had no leg swelling and her weight is down about 5 pounds.  I reviewed her medications which are stable from a cardiac perspective.  I also reviewed her interval lab work.  She continues to follow with Dr. Allena Katz.  We discussed getting a follow-up echocardiogram.  Physical Exam: VS:  BP 124/84   Pulse 80   Ht  (1.575 m)   Wt 264 lb 3.2 oz (119.8 kg)   SpO2 97%   BMI 48.32 kg/m , BMI Body mass index is 48.32 kg/m.  Wt Readings from Last 3 Encounters:  08/25/22 264 lb 3.2 oz (119.8 kg)  05/16/22 267 lb 12.8 oz (121.5 kg)  04/05/22 269 lb (122 kg)    General: Patient appears comfortable at rest. HEENT: Conjunctiva and lids normal. Neck: Supple, no elevated JVP or carotid bruits. Lungs: Clear to auscultation, nonlabored breathing at rest. Cardiac: Regular rate and rhythm, no S3, 2/6 systolic murmur. Extremities: No pitting edema.  ECG:  An ECG dated 01/18/2022 was personally reviewed today and demonstrated:  Sinus rhythm with right bundle branch block and left anterior fascicular block.  Labwork: 05/16/2022: ALT 12; AST 12; BUN 15; Creatinine, Ser 0.88; Hemoglobin 12.6; Platelets 311; Potassium 3.9; Sodium 145; TSH 3.590     Component Value Date/Time   CHOL 174 05/16/2022 0901   TRIG 106 05/16/2022 0901   HDL 39 (L) 05/16/2022 0901   CHOLHDL 4.5 (H) 05/16/2022 0901   CHOLHDL 6.8 05/13/2020 1005   VLDL 31 05/13/2020 1005   LDLCALC 116 (H) 05/16/2022 0901   LDLCALC 107 (H) 12/18/2019 7829   Other Studies Reviewed Today:  No interval cardiac testing for review  today.  Assessment and Plan:  1.  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  Last assessment of LVEF was in 2022 by follow-up cardiac MRI demonstrating LVEF 68% and resolution of myocarditis.  She did have severe asymmetric LV hypertrophy consistent with hypertrophic cardiomyopathy pattern and patchy late gadolinium enhancement consistent with hypertrophic cardiomyopathy although only 15% of total myocardial mass.  She is clinically stable with NYHA class II dyspnea, no palpitations or syncope.  Plan to update echocardiogram.  Current regimen includes Toprol-XL, Entresto, and Jardiance.  2.  Paroxysmal atrial fibrillation with CHA2DS2-VASc score of 5.  She reports no active palpitations and remains on Eliquis for stroke prophylaxis.  No spontaneous bleeding problems reported.  I reviewed her lab work from January.  3.  Essential hypertension.  She is also on Norvasc.  Blood pressure well-controlled today.  4.  Mixed hyperlipidemia.  LDL 116 in January.  She continues to follow with Dr. Allena Katz.  Disposition:  Follow up  6 months.  Signed, Jonelle Sidle, M.D., F.A.C.C. [facility]

## 2022-08-25 NOTE — Patient Instructions (Signed)
Medication Instructions:  Your physician recommends that you continue on your current medications as directed. Please refer to the Current Medication list given to you today.   Labwork: None today  Testing/Procedures: Your physician has requested that you have an echocardiogram. Echocardiography is a painless test that uses sound waves to create images of your heart. It provides your doctor with information about the size and shape of your heart and how well your heart's chambers and valves are working. This procedure takes approximately one hour. There are no restrictions for this procedure. Please do NOT wear cologne, perfume, aftershave, or lotions (deodorant is allowed). Please arrive 15 minutes prior to your appointment time.   Follow-Up: 6 months  Any Other Special Instructions Will Be Listed Below (If Applicable).  If you need a refill on your cardiac medications before your next appointment, please call your pharmacy.  

## 2022-08-30 NOTE — Therapy (Unsigned)
OUTPATIENT PHYSICAL THERAPY THORACOLUMBAR EVALUATION   Patient Name: Monique Gomez MRN: [id number] DOB:1956-03-25, 67 y.o., female Today's Date: 08/31/2022  END OF SESSION:  PT End of Session - 08/31/22 1013     Visit Number 1    Number of Visits 6    Date for PT Re-Evaluation 10/12/22    Authorization Type Francine Graven auth put in    Progress Note Due on Visit 6    PT Start Time 0908    PT Stop Time 1005    PT Time Calculation (min) 57 min    Activity Tolerance Patient tolerated treatment well    Behavior During Therapy [facility] for tasks assessed/performed             Past Medical History:  Diagnosis Date   Abdominal fibromatosis    Allergic rhinitis    Anemia    Asthma    Essential hypertension    GERD (gastroesophageal reflux disease)    History of cardiac catheterization    No significant CAD May 2015   History of migraine headaches    Hyperlipidemia    Iron deficiency anemia 02/05/2021   Myocarditis (HCC)    a. diagnosed in 05/2020 with cath showing normal cors   Obesity    PAF (paroxysmal atrial fibrillation) (HCC) 08/2013   Sleep apnea    Type 2 diabetes mellitus (HCC)    Past Surgical History:  Procedure Laterality Date   CATARACT EXTRACTION     CATARACT EXTRACTION W/PHACO Right 04/05/2019   Procedure: CATARACT EXTRACTION PHACO AND INTRAOCULAR LENS PLACEMENT RIGHT EYE (CDE: 3.19);  Surgeon: Fabio Pierce, MD;  Location: [facility];  Service: Ophthalmology;  Laterality: Right;   COLONOSCOPY N/A 02/02/2015   SLF: 1. one colon polyp removed-no source for anemia identified. 2. moderate diverticulosis noted in the sigmoid colon and descending colon 3. the left colon is redundant 4. Rectal bleeding due ot small internal hemorroids 5. Moderate sized external hemorrhoids.   ESOPHAGOGASTRODUODENOSCOPY N/A 02/02/2015   SLF: 1. 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 large hiatal hernia 3. mild non-erosive gastritis 4. No source for anemia identified.    LEFT  HEART CATH AND CORONARY ANGIOGRAPHY N/A 05/14/2020   Procedure: LEFT HEART CATH AND CORONARY ANGIOGRAPHY;  Surgeon: Lyn Records, MD;  Location: [facility] INVASIVE CV LAB;  Service: Cardiovascular;  Laterality: N/A;   LEFT HEART CATHETERIZATION WITH CORONARY ANGIOGRAM N/A 09/03/2013   Procedure: LEFT HEART CATHETERIZATION WITH CORONARY ANGIOGRAM;  Surgeon: Corky Crafts, MD;  Location: [facility] CATH LAB;  Service: Cardiovascular;  Laterality: N/A;   Right carpal tunnel release     Patient Active Problem List   Diagnosis Date Noted   Reactive airway disease 05/16/2022   Encounter for Medicare annual wellness exam 01/18/2022   Hypertrophic cardiomegaly 11/10/2021   Acquired thrombophilia (HCC) 11/10/2021   Encounter for general adult medical examination with abnormal findings 05/13/2021   Acute pansinusitis 05/13/2021   Iron deficiency anemia 02/05/2021   Arthritis of carpometacarpal (CMC) joint of left thumb 02/04/2021   Arthritis of carpometacarpal (CMC) joint of right thumb 02/04/2021   Lumbar radiculopathy 01/11/2021   Myocarditis (HCC)    Vitamin D deficiency 07/18/2019   Empty sella syndrome (HCC) 10/24/2018   Hemorrhoids 11/25/2014   Obstructive sleep apnea 10/14/2013   PAF (paroxysmal atrial fibrillation) (HCC) 09/06/2013   NSTEMI (non-ST elevated myocardial infarction) (HCC) 09/03/2013   MDD (major depressive disorder), single episode 02/04/2013   Anxiety state, unspecified 02/04/2013   DM (diabetes mellitus) (HCC) 06/06/2012  Carpal tunnel syndrome of left wrist 12/27/2011   De Quervain's disease (tenosynovitis) 10/09/2011   GERD (gastroesophageal reflux disease) 02/27/2011   Allergic rhinitis 08/29/2010   Anemia 02/15/2010   Hyperlipidemia 12/23/2009   Morbid obesity (HCC) 12/23/2009   Migraine 12/23/2009   HTN (hypertension) 12/23/2009    PCP: Trena Platt  REFERRING PROVIDER: Juanda Chance, NP  REFERRING DIAG:  M54.50,G89.29 (ICD-10-CM) - Chronic bilateral low back  pain without sciatica  M79.18 (ICD-10-CM) - Myofascial pain syndrome    Rationale for Evaluation and Treatment: Rehabilitation  THERAPY DIAG:  Chronic Low back pain Muscle weakness  ONSET DATE: years but acute exacerbation for the past year.                                                                                                                                                                                           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  She now is having increased pain almost immediately with walking and standing and she has increase pain even with sitting.  She got an epidural shot which helped but now it has worn off and the pain is even more severe.  The pain is equal B, if she tries to walk she gets a burning sensation down her legs B.  She is able to walk for 10 minutes,  Standing she is almost immediately leaning and is limited to 15 minutes.  Sitting pt needs to fidget around after 20 minutes.  PT states that in November she went on a cruise and had to use a wheelchair due to her back pain.    PERTINENT HISTORY:  past medical history of paroxysmal atrial fibrillation, hypertension, myocarditis, OSA on CPAP, GERD, DM, HL  PAIN:  Are you having pain? Yes: NPRS scale: 6/10; worst: 10/10; best:  3/10 Pain location: B from mid to low back goes around her stomach to her navel.  She has been to GYN and oncologist everyone feels the stomach pain is coming from her back. Pain description:  Aggravating factors: throbbing, aching, burning  Relieving factors: hot baths  PRECAUTIONS: None  WEIGHT BEARING RESTRICTIONS: No  FALLS:  Has patient fallen in last 6 months? No  LIVING ENVIRONMENT: Lives with: lives with their family Lives in: House/apartment Stairs: Yes: External: 2 steps; on right going up aggravates back  Has following equipment at home: None  OCCUPATION: retired   PLOF: Independent  PATIENT  GOALS: less pain   NEXT MD VISIT: 12/01/22  OBJECTIVE:   DIAGNOSTIC FINDINGS:  1. No spinal canal stenosis or neural foraminal narrowing.  2. Multilevel facet arthropathy, worst at L3-L4, which can be a cause of back pain.     Electronically Signed   By: Wiliam Ke M.D.   On: 02/22/2022 21:17  PATIENT SURVEYS:  FOTO 41  COGNITION: Overall cognitive status: Within functional limits for tasks assessed      POSTURE: rounded shoulders, increased lumbar lordosis, and anterior pelvic tilt   LUMBAR ROM:   AROM eval  Flexion Fingers 6" from floor; reps has increased pain coming up   Extension 20- reps slightly worse  Right lateral flexion   Left lateral flexion   Right rotation   Left rotation    (Blank rows = not tested)    LOWER EXTREMITY MMT:    MMT Right eval Left eval  Hip flexion 3+ 3  Hip extension 4 3  Hip abduction 5 4  Hip adduction    Hip internal rotation    Hip external rotation    Knee flexion 4 3+  Knee extension 5 5  Ankle dorsiflexion 5 5  Ankle plantarflexion    Ankle inversion    Ankle eversion     (Blank rows = not tested)    FUNCTIONAL TESTS: single leg stance: RT: 3"  , LT:  1" 30 seconds chair stand test: 11, (11 is above average for age and sex) 2 minute walk test: 339 ft     TODAY'S TREATMENT:                                                                                                                              DATE: 08/31/22 Eval Sitting: Abdominal set x 5 hold 5" Glut set x 5 hold for 5" Tall sitting posture hold for 5" Scapular retraction hold for 5 " Semi tandem stance hold 30 "    PATIENT EDUCATION:  Education details: HEP Person educated: Patient Education method: Explanation and Handouts Education comprehension: verbalized understanding and returned demonstration  HOME EXERCISE PROGRAM: Access Code: [email or web address] URL: [email or web address] Date: 08/31/2022 Prepared by: Virgina Organ  Exercises - Correct Seated Posture  - 3 x daily - 7 x weekly - 1 sets - 10 reps - 5" hold - Seated Transversus Abdominis Bracing  - 3 x daily - 7 x weekly - 1 sets - 10 reps - 5" hold - Seated Gluteal Sets  - 3 x daily - 7 x weekly - 1 sets - 10 reps - 5" hold - Seated Scapular Retraction  - 3 x daily - 7 x weekly - 1 sets - 10 reps - 5" hold - Standing Romberg to 1/2 Tandem Stance  - 2 x daily - 7 x weekly - 1 sets - 10 reps - 30" hold  ASSESSMENT:  CLINICAL IMPRESSION: Patient is a 67 y.o. female who was seen today for physical therapy evaluation and treatment for chronic low back pain. Evaluation demonstrates decreased ROM, decreased strength, postural dysfunction, decreased activity tolerance and increased Pain.  Ms. Sirois  will benefit from skilled PT to address these issues and maximize her functional ability.  OBJECTIVE IMPAIRMENTS: decreased activity tolerance, decreased balance, difficulty walking, decreased ROM, decreased strength, increased fascial restrictions, postural dysfunction, and pain.   ACTIVITY LIMITATIONS: carrying, lifting, bending, standing, squatting, stairs, and locomotion level  PARTICIPATION LIMITATIONS: shopping, community activity, occupation, and yard work  PERSONAL FACTORS: Fitness, Past/current experiences, and Time since onset of injury/illness/exacerbation are also affecting patient's functional outcome.   REHAB POTENTIAL: Good  CLINICAL DECISION MAKING: Stable/uncomplicated  EVALUATION COMPLEXITY: Moderate   GOALS: Goals reviewed with patient? No  SHORT TERM GOALS: Target date: 09/21/22  Pt to be I in HEP in order to decrease her back/stomach pain to no greater than an 7/10 Baseline: Goal status: INITIAL  2.  PT to be able to ambulate 400 ft in two minutes without increased Pain Baseline:  Goal status: INITIAL     LONG TERM GOALS: Target date: 10/12/22  Pt to be I in advanced HEP in order to reduce her low back pain to no  greater than a 5/10 Baseline:  Goal status: INITIAL  2.  PT to be able to walk for 15 minutes without any increased back pain. Baseline:  Goal status: INITIAL  3.  Pt to be able to stand for cooking for 15 minutes without any back pain.  Baseline:  Goal status: INITIAL    PLAN:  PT FREQUENCY: 1x/week ( due to financial)  PT DURATION: 6 weeks  PLANNED INTERVENTIONS: Therapeutic exercises, Therapeutic activity, Neuromuscular re-education, Balance training, Gait training, Patient/Family education, Self Care, Joint mobilization, and Manual therapy.  PLAN FOR NEXT SESSION: Give pt HEP every session as pt is only coming once a week.  Thera-band postural exercises, begin pt on a walking program. Begin lumbar stabilization as well as Optometrist education.    Virgina Organ, PT CLT [phone]

## 2022-08-31 ENCOUNTER — Ambulatory Visit (HOSPITAL_COMMUNITY): Payer: Medicare HMO | Attending: Physical Medicine and Rehabilitation | Admitting: Physical Therapy

## 2022-08-31 DIAGNOSIS — M545 Low back pain, unspecified: Secondary | ICD-10-CM | POA: Diagnosis not present

## 2022-08-31 DIAGNOSIS — G8929 Other chronic pain: Secondary | ICD-10-CM | POA: Insufficient documentation

## 2022-08-31 DIAGNOSIS — M7918 Myalgia, other site: Secondary | ICD-10-CM | POA: Insufficient documentation

## 2022-08-31 DIAGNOSIS — R2689 Other abnormalities of gait and mobility: Secondary | ICD-10-CM | POA: Diagnosis not present

## 2022-08-31 DIAGNOSIS — R262 Difficulty in walking, not elsewhere classified: Secondary | ICD-10-CM | POA: Insufficient documentation

## 2022-09-02 ENCOUNTER — Other Ambulatory Visit: Payer: Self-pay | Admitting: Internal Medicine

## 2022-09-06 ENCOUNTER — Inpatient Hospital Stay: Payer: Medicare HMO

## 2022-09-08 ENCOUNTER — Encounter (HOSPITAL_COMMUNITY): Payer: Self-pay | Admitting: Hematology

## 2022-09-08 ENCOUNTER — Inpatient Hospital Stay: Payer: Medicare HMO | Attending: Physician Assistant

## 2022-09-08 DIAGNOSIS — Z87891 Personal history of nicotine dependence: Secondary | ICD-10-CM | POA: Diagnosis not present

## 2022-09-08 DIAGNOSIS — D508 Other iron deficiency anemias: Secondary | ICD-10-CM

## 2022-09-08 DIAGNOSIS — D509 Iron deficiency anemia, unspecified: Secondary | ICD-10-CM | POA: Diagnosis not present

## 2022-09-08 DIAGNOSIS — R591 Generalized enlarged lymph nodes: Secondary | ICD-10-CM | POA: Diagnosis not present

## 2022-09-08 DIAGNOSIS — R718 Other abnormality of red blood cells: Secondary | ICD-10-CM

## 2022-09-08 LAB — CBC WITH DIFFERENTIAL/PLATELET
Abs Immature Granulocytes: 0.04 10*3/uL (ref 0.00–0.07)
Basophils Absolute: 0 10*3/uL (ref 0.0–0.1)
Basophils Relative: 0 %
Eosinophils Absolute: 0.2 10*3/uL (ref 0.0–0.5)
Eosinophils Relative: 2 %
HCT: 42.8 % (ref 36.0–46.0)
Hemoglobin: 12.9 g/dL (ref 12.0–15.0)
Immature Granulocytes: 0 %
Lymphocytes Relative: 20 %
Lymphs Abs: 2.1 10*3/uL (ref 0.7–4.0)
MCH: 22.3 pg — ABNORMAL LOW (ref 26.0–34.0)
MCHC: 30.1 g/dL (ref 30.0–36.0)
MCV: 73.9 fL — ABNORMAL LOW (ref 80.0–100.0)
Monocytes Absolute: 0.7 10*3/uL (ref 0.1–1.0)
Monocytes Relative: 7 %
Neutro Abs: 7.3 10*3/uL (ref 1.7–7.7)
Neutrophils Relative %: 71 %
Platelets: 338 10*3/uL (ref 150–400)
RBC: 5.79 MIL/uL — ABNORMAL HIGH (ref 3.87–5.11)
RDW: 16.4 % — ABNORMAL HIGH (ref 11.5–15.5)
WBC: 10.4 10*3/uL (ref 4.0–10.5)
nRBC: 0 % (ref 0.0–0.2)

## 2022-09-08 LAB — IRON AND TIBC
Iron: 40 ug/dL (ref 28–170)
Saturation Ratios: 13 % (ref 10.4–31.8)
TIBC: 301 ug/dL (ref 250–450)
UIBC: 261 ug/dL

## 2022-09-08 LAB — FERRITIN: Ferritin: 81 ng/mL (ref 11–307)

## 2022-09-11 ENCOUNTER — Encounter (HOSPITAL_COMMUNITY): Payer: Self-pay | Admitting: Hematology

## 2022-09-12 NOTE — Progress Notes (Unsigned)
[facility] [address] [place], [place] [address]   CLINIC:  Medical Oncology/Hematology  PCP:  Anabel Halon, MD [address] [place] [address] [phone]   REASON FOR VISIT:  Follow-up for lymphadenopathy and iron deficiency anemia   PRIOR THERAPY: None   CURRENT THERAPY: Intermittent IV iron, iron tablet daily  INTERVAL HISTORY:   Monique Gomez 67 y.o. female returns for routine follow-up of lymphadenopathy and iron deficiency anemia.  She was last seen by Rojelio Brenner PA-C on 03/15/2022.   At today's visit, she reports feeling fairly well apart from some back pain.  No recent hospitalizations, surgeries, or changes in baseline health status.   She continues to take her iron tablet once daily, but has to miss some days due to diarrhea.  She denies any gross hematochezia, melena, or epistaxis.  She reports that her energy is good at about 80%.  Her migraine headaches have resolved after starting Botox injections.  She has some mild shortness of breath with exertion.  Denies any restless legs, pica, chest pain, lightheadedness, or syncope.   She denies any current lymphadenopathy.  She reports that she continues to see Dr. Suszanne Conners due to chronic inflammation and swelling of her nasal passages related to narrow nasal passages.  She denies any recent infections, fever, chills, night sweats, or unintentional weight loss.   She has 80% energy and 100% appetite.  Her weight has been stable.  ASSESSMENT & PLAN:  1.  Subcentimeter left supraclavicular adenopathy, waxing and waning - She reported lymphadenopathy since ~September 2020 - Ultrasound soft tissue neck on 01/25/2019 shows left supraclavicular lymph node measuring 1 cm.  A CT of the soft tissue of the neck on the same day showed 3 small supraclavicular lymph nodes, largest measuring 8-9 mm.  Small posterior triangle lymph node, none more than 9 mm.  Consistent with mild reactive adenitis. - CT soft  tissue neck with contrast on 02/22/2019 showed right level 2 lymph node measuring 6 mm unchanged.  Subcentimeter posterior triangle lymph nodes on the right unchanged.  Largest measures 8 mm.  Left level 2 lymph node measures 8.6 mm unchanged.  Cluster of supraclavicular lymph nodes on the left also slightly smaller.  Largest measures 5 mm.  No oropharyngeal masses. - CT soft tissue neck on 06/17/2019 shows left level 2 lymph node measuring 1.2 cm, previously 1.1 cm.  Stable cluster of nonenlarged left supraclavicular lymph nodes. - CT angiogram from 05/13/2020 which showed no lymphadenopathy in the base of the neck.  No thoracic adenopathy.  Diffuse normal-sized mediastinal and axillary lymph nodes noted. - Former smoker.  She smoked 1 pack of cigarettes per week for 35 years.  She quit smoking in 2013. - She sees Dr. Suszanne Conners for chronic sinusitis - No recent infections or antibiotics - She does not report any fevers, night sweats or weight loss in the last 6 months.    - Physical examination today did not reveal any palpable adenopathy in the neck region or axillary region.    - Most recent CBC (09/08/2022): Normal WBC and differential. - Suspect intermittent reactive lymphadenitis/lymphadenopathy, likely related to flareups of her chronic sinusitis - PLAN: No further work-up indicated at this time.  We will reassess with physical exam and follow-up visit in 6 months.   2.  Iron deficiency anemia: - She reports taking iron tablet daily for many years.   - EGD/colonoscopy (02/02/2015): Nonerosive gastritis, no source for anemia identified on EGD.  Polyp and diverticulosis, rectal bleeding from small internal hemorrhoids. - No major bleeding episodes such as epistaxis, hematemesis, hematochezia, or melena - Most recent IV iron with Venofer 1000 mg divided doses in December 2023/January 2024 - Most recent labs (09/08/2022): Hgb 12.9/MCV 73.9, ferritin 81, iron saturation 13%.   - Longstanding history of  microcytic erythrocytosis, regardless of hemoglobin levels.  Hemoglobin fractionation cascade was negative, but this does not rule out alpha thalassemia trait.   - PLAN: No strong indication for IV iron at this time.   - Continue oral iron supplementation - Repeat CBC & iron panel and follow-up in 6 months. - We discussed suspected alpha thalassemia trait, but patient DECLINES genetic testing, as it would be unlikely to change overall management.    PLAN SUMMARY: >> Labs in 5 months = CBC/D, ferritin, iron/TIBC >> OFFICE visit after labs     REVIEW OF SYSTEMS:   Review of Systems  Constitutional:  Positive for fatigue (mild). Negative for appetite change, chills, diaphoresis, fever and unexpected weight change.  HENT:   Negative for lump/mass and nosebleeds.   Eyes:  Negative for eye problems.  Respiratory:  Positive for shortness of breath. Negative for cough and hemoptysis.   Cardiovascular:  Negative for chest pain, leg swelling and palpitations.  Gastrointestinal:  Negative for abdominal pain, blood in stool, constipation, diarrhea, nausea and vomiting.  Genitourinary:  Negative for hematuria.   Musculoskeletal:  Positive for back pain.  Skin: Negative.   Neurological:  Positive for headaches. Negative for dizziness and light-headedness.  Hematological:  Does not bruise/bleed easily.     PHYSICAL EXAM:  ECOG PERFORMANCE STATUS: 1 - Symptomatic but completely ambulatory  There were no vitals filed for this visit. There were no vitals filed for this visit. Physical Exam Constitutional:      Appearance: Normal appearance. She is morbidly obese.  Cardiovascular:     Heart sounds: Normal heart sounds.  Pulmonary:     Breath sounds: Normal breath sounds.  Lymphadenopathy:     Head:     Right side of head: No submental, submandibular, tonsillar, preauricular, posterior auricular or occipital adenopathy.     Left side of head: No submental, submandibular, tonsillar,  preauricular, posterior auricular or occipital adenopathy.     Cervical: No cervical adenopathy.     Right cervical: No superficial, deep or posterior cervical adenopathy.    Left cervical: No superficial, deep or posterior cervical adenopathy.     Upper Body:     Right upper body: No supraclavicular, axillary, pectoral or epitrochlear adenopathy.     Left upper body: No supraclavicular, axillary, pectoral or epitrochlear adenopathy.  Neurological:     General: No focal deficit present.     Mental Status: Mental status is at baseline.  Psychiatric:        Behavior: Behavior normal. Behavior is cooperative.     PAST MEDICAL/SURGICAL HISTORY:  Past Medical History:  Diagnosis Date   Abdominal fibromatosis    Allergic rhinitis    Anemia    Asthma    Essential hypertension    GERD (gastroesophageal reflux disease)    History of cardiac catheterization    No significant CAD May 2015   History of migraine headaches    Hyperlipidemia    Iron deficiency anemia 02/05/2021   Myocarditis (HCC)    a. diagnosed in 05/2020 with cath showing normal cors   Obesity    PAF (paroxysmal atrial fibrillation) (HCC) 08/2013   Sleep apnea    Type  2 diabetes mellitus [facility])    Past Surgical History:  Procedure Laterality Date   CATARACT EXTRACTION     CATARACT EXTRACTION W/PHACO Right 04/05/2019   Procedure: CATARACT EXTRACTION PHACO AND INTRAOCULAR LENS PLACEMENT RIGHT EYE (CDE: 3.19);  Surgeon: Fabio Pierce, MD;  Location: [facility];  Service: Ophthalmology;  Laterality: Right;   COLONOSCOPY N/A 02/02/2015   SLF: 1. one colon polyp removed-no source for anemia identified. 2. moderate diverticulosis noted in the sigmoid colon and descending colon 3. the left colon is redundant 4. Rectal bleeding due ot small internal hemorroids 5. Moderate sized external hemorrhoids.   ESOPHAGOGASTRODUODENOSCOPY N/A 02/02/2015   SLF: 1. 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 large hiatal hernia 3. mild  non-erosive gastritis 4. No source for anemia identified.    LEFT HEART CATH AND CORONARY ANGIOGRAPHY N/A 05/14/2020   Procedure: LEFT HEART CATH AND CORONARY ANGIOGRAPHY;  Surgeon: Lyn Records, MD;  Location: [facility] INVASIVE CV LAB;  Service: Cardiovascular;  Laterality: N/A;   LEFT HEART CATHETERIZATION WITH CORONARY ANGIOGRAM N/A 09/03/2013   Procedure: LEFT HEART CATHETERIZATION WITH CORONARY ANGIOGRAM;  Surgeon: Corky Crafts, MD;  Location: [facility] CATH LAB;  Service: Cardiovascular;  Laterality: N/A;   Right carpal tunnel release      SOCIAL HISTORY:  Social History   Socioeconomic History   Marital status: Single    Spouse name: Not on file   Number of children: 2   Years of education: Not on file   Highest education level: Not on file  Occupational History   Occupation: Child psychotherapist    Employer: [employer]  Tobacco Use   Smoking status: Former    Packs/day: .3    Types: Cigarettes    Quit date: 04/01/2012    Years since quitting: 10.4   Smokeless tobacco: Never  Vaping Use   Vaping Use: Never used  Substance and Sexual Activity   Alcohol use: Not Currently    Alcohol/week: 0.0 standard drinks of alcohol    Comment: very occasional   Drug use: No   Sexual activity: Not Currently    Birth control/protection: Post-menopausal  Other Topics Concern   Not on file  Social History Narrative   Not on file   Social Determinants of Health   Financial Resource Strain: Low Risk  (04/05/2022)   Overall Financial Resource Strain (CARDIA)    Difficulty of Paying Living Expenses: Not hard at all  Food Insecurity: No Food Insecurity (04/05/2022)   Hunger Vital Sign    Worried About Running Out of Food in the Last Year: Never true    Ran Out of Food in the Last Year: Never true  Transportation Needs: No Transportation Needs (04/05/2022)   PRAPARE - Administrator, Civil Service (Medical): No    Lack of Transportation (Non-Medical): No  Physical Activity:  Insufficiently Active (04/05/2022)   Exercise Vital Sign    Days of Exercise per Week: 2 days    Minutes of Exercise per Session: 10 min  Stress: No Stress Concern Present (04/05/2022)   [employer] of Occupational Health - Occupational Stress Questionnaire    Feeling of Stress : Not at all  Social Connections: Moderately Isolated (04/05/2022)   Social Connection and Isolation Panel [NHANES]    Frequency of Communication with Friends and Family: More than three times a week    Frequency of Social Gatherings with Friends and Family: More than three times a week    Attends Religious Services: 1 to  4 times per year    Active Member of Clubs or Organizations: No    Attends Banker Meetings: Never    Marital Status: Never married  Intimate Partner Violence: Not At Risk (04/05/2022)   Humiliation, Afraid, Rape, and Kick questionnaire    Fear of Current or Ex-Partner: No    Emotionally Abused: No    Physically Abused: No    Sexually Abused: No    FAMILY HISTORY:  Family History  Problem Relation Age of Onset   Diabetes Mother    Hypertension Mother    Heart disease Mother    High Cholesterol Mother    Alzheimer's disease Father    Hypertension Sister    Hypertension Brother    Diabetes Brother    Arthritis Sister    Hypertension Sister    High Cholesterol Sister    Diabetes Son    Developmental delay Son    Colon cancer Neg Hx     CURRENT MEDICATIONS:  Outpatient Encounter Medications as of 09/13/2022  Medication Sig Note   acetaminophen (TYLENOL) 500 MG tablet Take 1 tablet (500 mg total) by mouth every 6 (six) hours as needed.    albuterol (VENTOLIN HFA) 108 (90 Base) MCG/ACT inhaler INHALE 2 PUFFS BY MOUTH EVERY 6 HOURS AS NEEDED FOR WHEEZING OR SHORTNESS OF BREATH    atorvastatin (LIPITOR) 80 MG tablet Take 1 tablet by mouth once daily    blood glucose meter kit and supplies KIT Dispense based on patient and insurance preference. Use up to four times daily  as directed.    botulinum toxin Type A (BOTOX) 200 units injection Provider to inject 155 units into the muscles of the head and neck every 3 months. Discard remainder    conjugated estrogens (PREMARIN) vaginal cream Apply 0.5 gm which is a pea-sized amount with the applicator or fingertip vaginally at bedtime for 2 weeks and then 2-3 x weekly.    cyclobenzaprine (FLEXERIL) 10 MG tablet One tablet every twelve hours as needed for spasm.    ELIQUIS 5 MG TABS tablet Take 1 tablet by mouth twice daily    fluticasone (FLONASE) 50 MCG/ACT nasal spray Place 2 sprays into both nostrils daily.    Galcanezumab-gnlm (EMGALITY) 120 MG/ML SOAJ INJECT 120 MG  SUBCUTANEOUSLY ONCE EVERY 30 DAYS 02/10/2022: 02/10/22 approved until 05/01/22   HYDROcodone-acetaminophen (NORCO/VICODIN) 5-325 MG tablet One tablet every four hours as needed for acute pain.  Limit of five days per [place] statue.    ipratropium (ATROVENT) 0.03 % nasal spray Place 2 sprays into both nostrils every 12 (twelve) hours.    Iron-FA-B Cmp-C-Biot-Probiotic (FUSION PLUS) CAPS Take 1 capsule by mouth daily.    JARDIANCE 10 MG TABS tablet TAKE 1 TABLET BY MOUTH ONCE DAILY BEFORE BREAKFAST    meclizine (ANTIVERT) 25 MG tablet Take 1 tablet (25 mg total) by mouth 3 (three) times daily as needed for dizziness.    metFORMIN (GLUCOPHAGE) 500 MG tablet TAKE 1 TABLET BY MOUTH TWICE DAILY WITH A MEAL    metoCLOPramide (REGLAN) 10 MG tablet Take 1 tablet (10 mg total) by mouth every 8 (eight) hours as needed for up to 7 days for nausea (Headache).    metoprolol succinate (TOPROL-XL) 50 MG 24 hr tablet TAKE 1 TABLET BY MOUTH IN THE MORNING AND 1 AT BEDTIME. TAKE WITH OR IMMEDIATELY FOLLOWING A MEAL.    pantoprazole (PROTONIX) 40 MG tablet Take 1 tablet by mouth once daily    sacubitril-valsartan (ENTRESTO) 97-103 MG  Take 1 tablet by mouth 2 (two) times daily.    topiramate (TOPAMAX) 50 MG tablet Take 2 tablets (100 mg total) by mouth at bedtime.     Ubrogepant (UBRELVY) 50 MG TABS TAKE ONE TABLET BY MOUTH AT THE ONSET OF A MIGRAINE. CAN REPEAT IN 2 HOURS IF NEEDED    No facility-administered encounter medications on file as of 09/13/2022.    ALLERGIES:  Allergies  Allergen Reactions   Venlafaxine Nausea And Vomiting and Other (See Comments)    Dizziness, shakiness    LABORATORY DATA:  I have reviewed the labs as listed.  CBC    Component Value Date/Time   WBC 10.4 09/08/2022 0958   RBC 5.79 (H) 09/08/2022 0958   HGB 12.9 09/08/2022 0958   HGB 12.6 05/16/2022 0901   HCT 42.8 09/08/2022 0958   HCT 41.3 05/16/2022 0901   PLT 338 09/08/2022 0958   PLT 311 05/16/2022 0901   MCV 73.9 (L) 09/08/2022 0958   MCV 72 (L) 05/16/2022 0901   MCH 22.3 (L) 09/08/2022 0958   MCHC 30.1 09/08/2022 0958   RDW 16.4 (H) 09/08/2022 0958   RDW 16.1 (H) 05/16/2022 0901   LYMPHSABS 2.1 09/08/2022 0958   LYMPHSABS 1.7 05/16/2022 0901   MONOABS 0.7 09/08/2022 0958   EOSABS 0.2 09/08/2022 0958   EOSABS 0.2 05/16/2022 0901   BASOSABS 0.0 09/08/2022 0958   BASOSABS 0.0 05/16/2022 0901      Latest Ref Rng & Units 05/16/2022    9:01 AM 11/02/2021    9:26 AM 05/13/2021    8:48 AM  CMP  Glucose 70 - 99 mg/dL 161  096  045   BUN 8 - 27 mg/dL 15  20  13    Creatinine 0.57 - 1.00 mg/dL 4.09  8.11  9.14   Sodium 134 - 144 mmol/L 145  140  146   Potassium 3.5 - 5.2 mmol/L 3.9  3.6  4.2   Chloride 96 - 106 mmol/L 109  109  108   CO2 20 - 29 mmol/L 19  23  23    Calcium 8.7 - 10.3 mg/dL 78.2  9.0  95.6   Total Protein 6.0 - 8.5 g/dL 6.8   7.1   Total Bilirubin 0.0 - 1.2 mg/dL <2.1   <3.0   Alkaline Phos 44 - 121 IU/L 106   100   AST 0 - 40 IU/L 12   16   ALT 0 - 32 IU/L 12   14     DIAGNOSTIC IMAGING:  I have independently reviewed the relevant imaging and discussed with the patient.   WRAP UP:  All questions were answered. The patient knows to call the clinic with any problems, questions or concerns.  Medical decision making: Moderate  Time  spent on visit: I spent 20 minutes counseling the patient face to face. The total time spent in the appointment was 30 minutes and more than 50% was on counseling.  Carnella Guadalajara, PA-C  09/13/22 1:57 PM

## 2022-09-13 ENCOUNTER — Encounter (HOSPITAL_COMMUNITY): Payer: Medicare HMO | Admitting: Physical Therapy

## 2022-09-13 ENCOUNTER — Inpatient Hospital Stay (HOSPITAL_BASED_OUTPATIENT_CLINIC_OR_DEPARTMENT_OTHER): Payer: Medicare HMO | Admitting: Physician Assistant

## 2022-09-13 ENCOUNTER — Other Ambulatory Visit: Payer: Self-pay

## 2022-09-13 ENCOUNTER — Ambulatory Visit: Payer: Medicare HMO | Admitting: Physician Assistant

## 2022-09-13 VITALS — BP 138/97 | HR 87 | Temp 97.4°F | Resp 18 | Ht 62.0 in | Wt 263.0 lb

## 2022-09-13 DIAGNOSIS — Z87891 Personal history of nicotine dependence: Secondary | ICD-10-CM | POA: Diagnosis not present

## 2022-09-13 DIAGNOSIS — D508 Other iron deficiency anemias: Secondary | ICD-10-CM | POA: Diagnosis not present

## 2022-09-13 DIAGNOSIS — R591 Generalized enlarged lymph nodes: Secondary | ICD-10-CM | POA: Diagnosis not present

## 2022-09-13 DIAGNOSIS — R718 Other abnormality of red blood cells: Secondary | ICD-10-CM

## 2022-09-13 DIAGNOSIS — E119 Type 2 diabetes mellitus without complications: Secondary | ICD-10-CM | POA: Diagnosis not present

## 2022-09-13 DIAGNOSIS — R590 Localized enlarged lymph nodes: Secondary | ICD-10-CM

## 2022-09-13 DIAGNOSIS — D509 Iron deficiency anemia, unspecified: Secondary | ICD-10-CM | POA: Diagnosis not present

## 2022-09-13 DIAGNOSIS — I509 Heart failure, unspecified: Secondary | ICD-10-CM | POA: Diagnosis not present

## 2022-09-13 NOTE — Patient Instructions (Signed)
[facility] at [facility] Discharge Instructions  You were seen today by Rojelio Brenner PA-C for your iron deficiency anemia and your lymph node swelling.  IRON DEFICIENCY ANEMIA:  Your blood count is normal at this visit.    Your iron levels are adequate. You do not need any IV iron today. Continue to take your iron tablet once daily.   We will recheck your labs again in 6 months.  LYMPH NODE SWELLING: No abnormal lymph nodes were found on exam today.  I suspect that the on-and-off lymph node swelling you experience is secondary to inflammation in your nasal cavity.  If you have any lymph node swelling that does not go away, please let us know.  MEDICATIONS: Continue taking iron tablet daily.  FOLLOW-UP APPOINTMENT: Office visit in 6 months (with labs the week before)  ** Thank you for trusting me with your healthcare!  I strive to provide all of my patients with quality care at each visit.  If you receive a survey for this visit, I would be so grateful to you for taking the time to provide feedback.  Thank you in advance!  ~ Artrell Lawless                   Dr. Doreatha Massed   &   Rojelio Brenner, PA-C   - - - - - - - - - - - - - - - - - -     Thank you for choosing [facility] at [facility] to provide your oncology and hematology care.  To afford each patient quality time with our provider, please arrive at least 15 minutes before your scheduled appointment time.   If you have a lab appointment with the Cancer Center please come in thru the Main Entrance and check in at the main information desk.  You need to re-schedule your appointment should you arrive 10 or more minutes late.  We strive to give you quality time with our providers, and arriving late affects you and other patients whose appointments are after yours.  Also, if you no show three or more times for appointments you may be dismissed from the clinic at the providers  discretion.     Again, thank you for choosing [facility].  Our hope is that these requests will decrease the amount of time that you wait before being seen by our physicians.       _____________________________________________________________  Should you have questions after your visit to [facility], please contact our office at [phone] and follow the prompts.  Our office hours are 8:00 a.m. and 4:30 p.m. Monday - Friday.  Please note that voicemails left after 4:00 p.m. may not be returned until the following business day.  We are closed weekends and major holidays.  You do have access to a nurse 24-7, just call the main number to the clinic [phone] and do not press any options, hold on the line and a nurse will answer the phone.    For prescription refill requests, have your pharmacy contact our office and allow 72 hours.    Due to Covid, you will need to wear a mask upon entering the hospital. If you do not have a mask, a mask will be given to you at the Main Entrance upon arrival. For doctor visits, patients may have 1 support person age 13 or older with them. For treatment visits, patients can not  have anyone with them due to social distancing guidelines and our immunocompromised population.

## 2022-09-16 ENCOUNTER — Other Ambulatory Visit: Payer: Self-pay | Admitting: Cardiology

## 2022-09-16 ENCOUNTER — Encounter (HOSPITAL_COMMUNITY): Payer: Self-pay | Admitting: Hematology

## 2022-09-16 ENCOUNTER — Ambulatory Visit: Payer: Medicare HMO | Admitting: Internal Medicine

## 2022-09-16 ENCOUNTER — Other Ambulatory Visit (HOSPITAL_COMMUNITY): Payer: Self-pay

## 2022-09-20 ENCOUNTER — Ambulatory Visit (HOSPITAL_COMMUNITY): Payer: Medicare HMO | Admitting: Physical Therapy

## 2022-09-20 DIAGNOSIS — R262 Difficulty in walking, not elsewhere classified: Secondary | ICD-10-CM | POA: Diagnosis not present

## 2022-09-20 DIAGNOSIS — M7918 Myalgia, other site: Secondary | ICD-10-CM | POA: Diagnosis not present

## 2022-09-20 DIAGNOSIS — M545 Low back pain, unspecified: Secondary | ICD-10-CM

## 2022-09-20 DIAGNOSIS — G8929 Other chronic pain: Secondary | ICD-10-CM | POA: Diagnosis not present

## 2022-09-20 DIAGNOSIS — R2689 Other abnormalities of gait and mobility: Secondary | ICD-10-CM | POA: Diagnosis not present

## 2022-09-20 NOTE — Therapy (Addendum)
OUTPATIENT PHYSICAL THERAPY  TREATMENT   Patient Name: Monique Gomez MRN: [id number] DOB:06-21-1955, 67 y.o., female Today's Date: 09/20/2022  END OF SESSION:  PT End of Session - 09/20/22 0814     Visit Number 2    Number of Visits 6    Date for PT Re-Evaluation 10/12/22    Authorization Type cohere approved 1 re-eval and 6 visits 5/1-6/30    Authorization - Visit Number 2    Authorization - Number of Visits 6    Progress Note Due on Visit 6    PT Start Time 0815    PT Stop Time 0855    PT Time Calculation (min) 40 min    Activity Tolerance Patient tolerated treatment well    Behavior During Therapy [facility] for tasks assessed/performed             Past Medical History:  Diagnosis Date   Abdominal fibromatosis    Allergic rhinitis    Anemia    Asthma    Essential hypertension    GERD (gastroesophageal reflux disease)    History of cardiac catheterization    No significant CAD May 2015   History of migraine headaches    Hyperlipidemia    Iron deficiency anemia 02/05/2021   Myocarditis (HCC)    a. diagnosed in 05/2020 with cath showing normal cors   Obesity    PAF (paroxysmal atrial fibrillation) (HCC) 08/2013   Sleep apnea    Type 2 diabetes mellitus (HCC)    Past Surgical History:  Procedure Laterality Date   CATARACT EXTRACTION     CATARACT EXTRACTION W/PHACO Right 04/05/2019   Procedure: CATARACT EXTRACTION PHACO AND INTRAOCULAR LENS PLACEMENT RIGHT EYE (CDE: 3.19);  Surgeon: Fabio Pierce, MD;  Location: [facility];  Service: Ophthalmology;  Laterality: Right;   COLONOSCOPY N/A 02/02/2015   SLF: 1. one colon polyp removed-no source for anemia identified. 2. moderate diverticulosis noted in the sigmoid colon and descending colon 3. the left colon is redundant 4. Rectal bleeding due ot small internal hemorroids 5. Moderate sized external hemorrhoids.   ESOPHAGOGASTRODUODENOSCOPY N/A 02/02/2015   SLF: 1. 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 large  hiatal hernia 3. mild non-erosive gastritis 4. No source for anemia identified.    LEFT HEART CATH AND CORONARY ANGIOGRAPHY N/A 05/14/2020   Procedure: LEFT HEART CATH AND CORONARY ANGIOGRAPHY;  Surgeon: Lyn Records, MD;  Location: [facility] IN[facility];  Service: Cardiovascular;  Laterality: N/A;   LEFT HEART CATHETERIZATION WITH CORONARY ANGIOGRAM N/A 09/03/2013   Procedure: LEFT HEART CATHETERIZATION WITH CORONARY ANGIOGRAM;  Surgeon: Corky Crafts, MD;  Location: [facility] CATH LAB;  Service: Cardiovascular;  Laterality: N/A;   Right carpal tunnel release     Patient Active Problem List   Diagnosis Date Noted   Reactive airway disease 05/16/2022   Encounter for Medicare annual wellness exam 01/18/2022   Hypertrophic cardiomegaly 11/10/2021   Acquired thrombophilia (HCC) 11/10/2021   Encounter for general adult medical examination with abnormal findings 05/13/2021   Acute pansinusitis 05/13/2021   Iron deficiency anemia 02/05/2021   Arthritis of carpometacarpal (CMC) joint of left thumb 02/04/2021   Arthritis of carpometacarpal (CMC) joint of right thumb 02/04/2021   Lumbar radiculopathy 01/11/2021   Myocarditis (HCC)    Vitamin D deficiency 07/18/2019   Empty sella syndrome (HCC) 10/24/2018   Hemorrhoids 11/25/2014   Obstructive sleep apnea 10/14/2013   PAF (paroxysmal atrial fibrillation) (HCC) 09/06/2013   NSTEMI (non-ST elevated myocardial infarction) (HCC) 09/03/2013  MDD (major depressive disorder), single episode 02/04/2013   Anxiety state, unspecified 02/04/2013   DM (diabetes mellitus) (HCC) 06/06/2012   Carpal tunnel syndrome of left wrist 12/27/2011   De Quervain's disease (tenosynovitis) 10/09/2011   GERD (gastroesophageal reflux disease) 02/27/2011   Allergic rhinitis 08/29/2010   Anemia 02/15/2010   Hyperlipidemia 12/23/2009   Morbid obesity (HCC) 12/23/2009   Migraine 12/23/2009   HTN (hypertension) 12/23/2009    PCP: Trena Platt  REFERRING PROVIDER:  Juanda Chance, NP  REFERRING DIAG:  M54.50,G89.29 (ICD-10-CM) - Chronic bilateral low back pain without sciatica  M79.18 (ICD-10-CM) - Myofascial pain syndrome    Rationale for Evaluation and Treatment: Rehabilitation  THERAPY DIAG:  Chronic Low back pain Muscle weakness  ONSET DATE: years but acute exacerbation for the past year.                                                                                                                                                                                           SUBJECTIVE STATEMENT: Pt comes today with observable increase in pain in her lumbar region.  Reports 9.5/10. Took a tylenol arthritis this morning and has helped as her pain previous to this was 12/10.  Symptoms are into bil mid and lower back and around to the bottom of her stomach.  Hurts worse when she walks and sits.  States she up most of the day with her daughter (8:30-noon) shopping/getting groceries then she remained up putting things away, cleaning, cooking, bending down to use her dustpan, etc.  States she was up most of the day and now she is paying for it.    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  She now is having increased pain almost immediately with walking and standing and she has increase pain even with sitting.  She got an epidural shot which helped but now it has worn off and the pain is even more severe.  The pain is equal B, if she tries to walk she gets a burning sensation down her legs B.  She is able to walk for 10 minutes,  Standing she is almost immediately leaning and is limited to 15 minutes.  Sitting pt needs to fidget around after 20 minutes.  PT states that in November she went on a cruise and had to use a wheelchair due to her back pain.    PERTINENT HISTORY:  past medical history of paroxysmal atrial fibrillation, hypertension, myocarditis, OSA on CPAP, GERD, DM, HL  PAIN:  Are you having pain? Yes: NPRS  scale: 6/10; worst: 10/10; best:  3/10  Pain location: B from mid to low back goes around her stomach to her navel.  She has been to GYN and oncologist everyone feels the stomach pain is coming from her back. Pain description:  Aggravating factors: throbbing, aching, burning  Relieving factors: hot baths  PRECAUTIONS: None  WEIGHT BEARING RESTRICTIONS: No  FALLS:  Has patient fallen in last 6 months? No  LIVING ENVIRONMENT: Lives with: lives with their family Lives in: House/apartment Stairs: Yes: External: 2 steps; on right going up aggravates back  Has following equipment at home: None  OCCUPATION: retired   PLOF: Independent  PATIENT GOALS: less pain   NEXT MD VISIT: 12/01/22  OBJECTIVE:   DIAGNOSTIC FINDINGS:  1. No spinal canal stenosis or neural foraminal narrowing. 2. Multilevel facet arthropathy, worst at L3-L4, which can be a cause of back pain.     Electronically Signed   By: Wiliam Ke M.D.   On: 02/22/2022 21:17  PATIENT SURVEYS:  FOTO 41  COGNITION: Overall cognitive status: Within functional limits for tasks assessed      POSTURE: rounded shoulders, increased lumbar lordosis, and anterior pelvic tilt   LUMBAR ROM:   AROM eval  Flexion Fingers 6" from floor; reps has increased pain coming up   Extension 20- reps slightly worse  Right lateral flexion   Left lateral flexion   Right rotation   Left rotation    (Blank rows = not tested)    LOWER EXTREMITY MMT:    MMT Right eval Left eval  Hip flexion 3+ 3  Hip extension 4 3  Hip abduction 5 4  Hip adduction    Hip internal rotation    Hip external rotation    Knee flexion 4 3+  Knee extension 5 5  Ankle dorsiflexion 5 5  Ankle plantarflexion    Ankle inversion    Ankle eversion     (Blank rows = not tested)    FUNCTIONAL TESTS: single leg stance: RT: 3"  , LT:  1" 30 seconds chair stand test: 11, (11 is above average for age and sex) 2 minute walk test: 339 ft      TODAY'S TREATMENT:                                                                                                                              DATE: 09/20/22 Goal review, body mechanics discussion/recommendations Standing lumbar extensions 10X Long sitting hamstring stretch 2X30"  Piriformis stretch 2X30"  Scapular retractions (wback) 10X5" Supine:  abdominal isometrics 10X5"  bridge 10X  SLR 10X  Lower trunk rotation 10X  Piriformis stretch with cross over 2X30"  08/31/22 Eval Sitting: Abdominal set x 5 hold 5" Glut set x 5 hold for 5" Tall sitting posture hold for 5" Scapular retraction hold for 5 " Semi tandem stance hold 30 "    PATIENT EDUCATION:  Education details: HEP Person educated: Patient Education method: Explanation and Handouts Education comprehension: verbalized understanding  and returned demonstration  HOME EXERCISE PROGRAM: Access Code: [email or web address] URL: [email or web address]  Date: 09/20/2022 Prepared by: Emeline Gins Exercises -- Seated Figure 4 Piriformis Stretch  - 2 x daily - 7 x weekly - 1 sets - 3 reps - 30 sec hold - Seated Table Hamstring Stretch  - 2 x daily - 7 x weekly - 1 sets - 3 reps - 30 sec hold - Supine Bridge with Pelvic Floor Contraction  - 2 x daily - 7 x weekly - 2 sets - 10 reps - Supine Pelvic Tilt with Straight Leg Raise  - 2 x daily - 7 x weekly - 2 sets - 10 reps - Standing Lumbar Extension  - 2 x daily - 7 x weekly - 2 sets - 10 reps  Date: 08/31/2022 Prepared by: Virgina Organ Exercises - Correct Seated Posture  - 3 x daily - 7 x weekly - 1 sets - 10 reps - 5" hold - Seated Transversus Abdominis Bracing  - 3 x daily - 7 x weekly - 1 sets - 10 reps - 5" hold - Seated Gluteal Sets  - 3 x daily - 7 x weekly - 1 sets - 10 reps - 5" hold - Seated Scapular Retraction  - 3 x daily - 7 x weekly - 1 sets - 10 reps - 5" hold - Standing Romberg to 1/2 Tandem Stance  - 2 x daily - 7 x weekly - 1 sets - 10 reps - 30"  hold  ASSESSMENT:  CLINICAL IMPRESSION: Patient reports she was unable to make her last appt due to illness and today she is in a lot of pain.  Compliant with given therex thus far.  Spent large amount of time on education.  Suggested she purchase a long handled dust pan or a rechargeable stick vac so she is not bending over with sweeping.  Also problem solved other obstacles discussed in her kitchen area. Encouraged to pace her self and take rest breaks/stretch throughout the day.  Reviewed goals and POC moving forward.  Focused on pain control this session with addition of stretching and core stabilization.  Updated HEP to include exercises added today. Pt reported feeling better overall at end of session. Pt will continue to benefit from skilled PT to address her deficits including reduction of pain and improve her functional ability.  OBJECTIVE IMPAIRMENTS: decreased activity tolerance, decreased balance, difficulty walking, decreased ROM, decreased strength, increased fascial restrictions, postural dysfunction, and pain.   ACTIVITY LIMITATIONS: carrying, lifting, bending, standing, squatting, stairs, and locomotion level  PARTICIPATION LIMITATIONS: shopping, community activity, occupation, and yard work  PERSONAL FACTORS: Fitness, Past/current experiences, and Time since onset of injury/illness/exacerbation are also affecting patient's functional outcome.   REHAB POTENTIAL: Good  CLINICAL DECISION MAKING: Stable/uncomplicated  EVALUATION COMPLEXITY: Moderate   GOALS: Goals reviewed with patient? Yes  SHORT TERM GOALS: Target date: 09/21/22  Pt to be I in HEP in order to decrease her back/stomach pain to no greater than an 7/10 Baseline: Goal status: IN PROGRESS  2.  PT to be able to ambulate 400 ft in two minutes without increased Pain Baseline:  Goal status: IN PROGRESS     LONG TERM GOALS: Target date: 10/12/22  Pt to be I in advanced HEP in order to reduce her low back  pain to no greater than a 5/10 Baseline:  Goal status: IN PROGRESS  2.  PT to be able to walk for 15 minutes without any increased back pain. Baseline:  Goal  status: IN PROGRESS  3.  Pt to be able to stand for cooking for 15 minutes without any back pain.  Baseline:  Goal status: IN PROGRESS    PLAN:  PT FREQUENCY: 1x/week ( due to financial)  PT DURATION: 6 weeks  PLANNED INTERVENTIONS: Therapeutic exercises, Therapeutic activity, Neuromuscular re-education, Balance training, Gait training, Patient/Family education, Self Care, Joint mobilization, and Manual therapy PLAN FOR NEXT SESSION: Give pt HEP every session as pt is only coming once a week.  Progress to Thera-band postural exercises establish a walking program. Progress lumbar stabilization and Optometrist education.    Lurena Nida, PTA/CLT [facility] Ph: [phone]   Lurena Nida, PTA 09/20/2022, 10:54 AM

## 2022-09-22 ENCOUNTER — Ambulatory Visit (HOSPITAL_COMMUNITY): Admission: RE | Admit: 2022-09-22 | Payer: Medicare HMO | Source: Ambulatory Visit

## 2022-09-27 ENCOUNTER — Ambulatory Visit: Payer: Medicare HMO | Admitting: Neurology

## 2022-09-27 ENCOUNTER — Encounter (HOSPITAL_COMMUNITY): Payer: Medicare HMO | Admitting: Physical Therapy

## 2022-09-27 VITALS — BP 146/101 | HR 87 | Ht 62.0 in | Wt 265.0 lb

## 2022-09-27 DIAGNOSIS — G43709 Chronic migraine without aura, not intractable, without status migrainosus: Secondary | ICD-10-CM

## 2022-09-27 MED ORDER — ONABOTULINUMTOXINA 200 UNITS IJ SOLR
155.0000 [IU] | Freq: Once | INTRAMUSCULAR | Status: AC
  Administered 2022-09-27: 155 [IU] via INTRAMUSCULAR

## 2022-09-27 MED ORDER — TOPIRAMATE 50 MG PO TABS: 100.0000 mg | ORAL_TABLET | Freq: Every day | ORAL | 3 refills | Status: DC

## 2022-09-27 MED ORDER — UBRELVY 50 MG PO TABS: ORAL_TABLET | ORAL | 11 refills | Status: DC

## 2022-09-27 MED ORDER — EMGALITY 120 MG/ML ~~LOC~~ SOAJ: SUBCUTANEOUS | 11 refills | Status: DC

## 2022-09-27 NOTE — Progress Notes (Signed)
BOTOX PROCEDURE NOTE FOR MIGRAINE HEADACHE  HISTORY: Monique Gomez is here for Botox. Last injection was 06/15/22 by me. Has had ZERO migraines since Botox, before had near daily migraine.  Now only has mild sinus headaches. 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 Has not needed the Ubrelvy. Remains on Topamax 100 mg at bedtime, also on Emgality.    Description of procedure:  The patient was placed in a sitting position. The standard protocol was used for Botox as follows, with 5 units of Botox injected at each site:   -Procerus muscle, midline injection  -Corrugator muscle, bilateral injection  -Frontalis muscle, bilateral injection, with 2 sites each side, medial injection was performed in the upper one third of the frontalis muscle, in the region vertical from the medial inferior edge of the superior orbital rim. The lateral injection was again in the upper one third of the forehead vertically above the lateral limbus of the cornea, 1.5 cm lateral to the medial injection site.  -Temporalis muscle injection, 4 sites, bilaterally. The first injection was 3 cm above the tragus of the ear, second injection site was 1.5 cm to 3 cm up from the first injection site in line with the tragus of the ear. The third injection site was 1.5-3 cm forward between the first 2 injection sites. The fourth injection site was 1.5 cm posterior to the second injection site.  -Occipitalis muscle injection, 3 sites, bilaterally. The first injection was done one half way between the occipital protuberance and the tip of the mastoid process behind the ear. The second injection site was done lateral and superior to the first, 1 fingerbreadth from the first injection. The third injection site was 1 fingerbreadth superiorly and medially from the first injection site.  -Cervical paraspinal muscle injection, 2 sites, bilateral, the first injection site was 1 cm from the midline of the cervical spine, 3 cm inferior to the lower border  of the occipital protuberance. The second injection site was 1.5 cm superiorly and laterally to the first injection site.  -Trapezius muscle injection was performed at 3 sites, bilaterally. The first injection site was in the upper trapezius muscle halfway between the inflection point of the neck, and the acromion. The second injection site was one half way between the acromion and the first injection site. The third injection was done between the first injection site and the inflection point of the neck.   A 200 unit bottle of Botox was used, 155 units were injected, the rest of the Botox was wasted. The patient tolerated the procedure well, there were no complications of the above procedure.  Botox NDC [phone] Lot number [id number] Expiration date 09/2024 SP  Since her migraines are under such excellent control, may consider reducing Topamax back to 75 mg at bedtime, if her migraines continue to remain controlled at next Botox visit may reduce back to 50 mg.  Meds ordered this encounter  Medications   botulinum toxin Type A (BOTOX) injection 155 Units    Botox- 200 units x 1 vial Lot: [id number] Expiration: 09/2024 NDC: 7846-9629-52  Bacteriostatic 0.9% Sodium Chloride- 2 mL  Lot: [id number] Expiration: 08/2023 NDC: 4010-2725-36  Dx: U44.034 S/P  Witnessed by Memory Dance, RN   Ubrogepant (UBRELVY) 50 MG TABS    Sig: TAKE ONE TABLET BY MOUTH AT THE ONSET OF A MIGRAINE. CAN REPEAT IN 2 HOURS IF NEEDED    Dispense:  10 tablet    Refill:  11   topiramate (TOPAMAX) 50 MG  tablet    Sig: Take 2 tablets (100 mg total) by mouth at bedtime.    Dispense:  180 tablet    Refill:  3   Galcanezumab-gnlm (EMGALITY) 120 MG/ML SOAJ    Sig: INJECT 120 MG  SUBCUTANEOUSLY ONCE EVERY 30 DAYS    Dispense:  1 mL    Refill:  11

## 2022-09-27 NOTE — Progress Notes (Signed)
Botox- 200 units x 1 vial Lot: [id number] Expiration: 09/2024 NDC: 0454-0981-19  Bacteriostatic 0.9% Sodium Chloride- 2 mL  Lot: [id number] Expiration: 08/2023 NDC: 5621-3086-57  Dx: Q46.962 S/P  Witnessed by Memory Dance, RN

## 2022-10-04 ENCOUNTER — Encounter (HOSPITAL_COMMUNITY): Payer: Medicare HMO | Admitting: Physical Therapy

## 2022-10-10 ENCOUNTER — Encounter (HOSPITAL_COMMUNITY): Payer: Medicare HMO

## 2022-10-12 ENCOUNTER — Other Ambulatory Visit: Payer: Self-pay | Admitting: Internal Medicine

## 2022-10-17 ENCOUNTER — Encounter (HOSPITAL_COMMUNITY): Payer: Medicare HMO | Admitting: Physical Therapy

## 2022-10-21 ENCOUNTER — Ambulatory Visit (INDEPENDENT_AMBULATORY_CARE_PROVIDER_SITE_OTHER): Payer: Medicare HMO | Admitting: Internal Medicine

## 2022-10-21 ENCOUNTER — Encounter: Payer: Self-pay | Admitting: Internal Medicine

## 2022-10-21 VITALS — BP 116/79 | HR 88 | Ht 62.0 in | Wt 264.4 lb

## 2022-10-21 DIAGNOSIS — E1169 Type 2 diabetes mellitus with other specified complication: Secondary | ICD-10-CM | POA: Diagnosis not present

## 2022-10-21 DIAGNOSIS — D6869 Other thrombophilia: Secondary | ICD-10-CM | POA: Diagnosis not present

## 2022-10-21 DIAGNOSIS — R110 Nausea: Secondary | ICD-10-CM

## 2022-10-21 DIAGNOSIS — J309 Allergic rhinitis, unspecified: Secondary | ICD-10-CM | POA: Diagnosis not present

## 2022-10-21 DIAGNOSIS — I517 Cardiomegaly: Secondary | ICD-10-CM

## 2022-10-21 DIAGNOSIS — I480 Paroxysmal atrial fibrillation: Secondary | ICD-10-CM

## 2022-10-21 DIAGNOSIS — I10 Essential (primary) hypertension: Secondary | ICD-10-CM

## 2022-10-21 DIAGNOSIS — G43709 Chronic migraine without aura, not intractable, without status migrainosus: Secondary | ICD-10-CM

## 2022-10-21 LAB — POCT GLYCOSYLATED HEMOGLOBIN (HGB A1C): HbA1c, POC (controlled diabetic range): 6.3 % (ref 0.0–7.0)

## 2022-10-21 MED ORDER — OZEMPIC (0.25 OR 0.5 MG/DOSE) 2 MG/3ML ~~LOC~~ SOPN
PEN_INJECTOR | SUBCUTANEOUS | 1 refills | Status: DC
Start: 2022-10-21 — End: 2023-01-23

## 2022-10-21 MED ORDER — ONDANSETRON HCL 4 MG PO TABS: 4.0000 mg | ORAL_TABLET | Freq: Three times a day (TID) | ORAL | 0 refills | Status: DC | PRN

## 2022-10-21 NOTE — Assessment & Plan Note (Signed)
BP Readings from Last 1 Encounters:  10/21/22 116/79   Well-controlled with Sherryll Burger and Metoprolol Counseled for compliance with the medications Advised DASH diet and moderate exercise/walking, at least 150 mins/week

## 2022-10-21 NOTE — Patient Instructions (Signed)
Please start taking Ozempic 0.25 mg every week for 4 weeks and then 0.50 mg every week.  Please take Metformin once daily for 1 month and then stop it.  Please continue to take other medications as prescribed.  Please continue to follow low carb diet and perform moderate exercise/walking at least 150 mins/week.  Please get blood tests done before the next visit.

## 2022-10-21 NOTE — Assessment & Plan Note (Signed)
Currently in sinus rhythm On Toprol and Eliquis Follows up with Cardiologist 

## 2022-10-21 NOTE — Assessment & Plan Note (Signed)
Has chronic nasal congestion and postnasal drip, likely allergic rhinitis Needs to continue Flonase and Zyrtec Advised to alternate Zyrtec to Allegra or Claritin Has seen Dr. Suszanne Conners

## 2022-10-21 NOTE — Assessment & Plan Note (Signed)
On Emgality and Topamax Has started Botox injections as well Ubrelvy PRN Follows up with Neurology

## 2022-10-21 NOTE — Progress Notes (Addendum)
Established Patient Office Visit  Subjective:  Patient ID: Monique Gomez, female    DOB: [birth year]  Age: 67 y.o. MRN: [id number]  CC:  Chief Complaint  Patient presents with   Diabetes    HPI [name] is a 67 y.o. female with past medical history of paroxysmal atrial fibrillation, hypertension, myocarditis, OSA on CPAP, GERD, DM, HLD and morbid obesity who presents for annual physical.  BP is well-controlled. Takes medications regularly. Patient denies  chest pain or palpitations.  She takes Entresto for history of hypertrophic cardiomegaly.  She is followed by cardiology and heart failure clinic.  She reports exertional dyspnea, but denies any orthopnea or PND.  Denies any leg swelling.  Denies any dizziness or palpitations currently.  She has chronic nasal congestion and postnasal drip.  She has history of DM, for which she takes metformin.  She has been also taking Jardiance for history of myocarditis/CHF.  She denies any fatigue, polyuria or polydipsia currently.  She uses CPAP for OSA.   She is on Emgality for migraine.  She has chronic low back pain, for which she has seen orthopedic surgeon and PM&R.  She has not had physical therapy without much relief.  She has also had epidural injection in 11/23, but she has recurrence of low back pain for the last 1 month now.  Denies any recent injury or fall.  She has radiating symptoms to bilateral LE at times.  Denies any urinary or stool incontinence.      Past Medical History:  Diagnosis Date   Abdominal fibromatosis    Allergic rhinitis    Anemia    Asthma    Essential hypertension    GERD (gastroesophageal reflux disease)    History of cardiac catheterization    No significant CAD May 2015   History of migraine headaches    Hyperlipidemia    Iron deficiency anemia 02/05/2021   Myocarditis (HCC)    a. diagnosed in 05/2020 with cath showing normal cors   Obesity    PAF (paroxysmal atrial fibrillation)  (HCC) 08/2013   Sleep apnea    Type 2 diabetes mellitus [facility])     Past Surgical History:  Procedure Laterality Date   CATARACT EXTRACTION     CATARACT EXTRACTION W/PHACO Right 04/05/2019   Procedure: CATARACT EXTRACTION PHACO AND INTRAOCULAR LENS PLACEMENT RIGHT EYE (CDE: 3.19);  Surgeon: Fabio Pierce, MD;  Location: [facility];  Service: Ophthalmology;  Laterality: Right;   COLONOSCOPY N/A 02/02/2015   SLF: 1. one colon polyp removed-no source for anemia identified. 2. moderate diverticulosis noted in the sigmoid colon and descending colon 3. the left colon is redundant 4. Rectal bleeding due ot small internal hemorroids 5. Moderate sized external hemorrhoids.   ESOPHAGOGASTRODUODENOSCOPY N/A 02/02/2015   SLF: 1. 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 large hiatal hernia 3. mild non-erosive gastritis 4. No source for anemia identified.    LEFT HEART CATH AND CORONARY ANGIOGRAPHY N/A 05/14/2020   Procedure: LEFT HEART CATH AND CORONARY ANGIOGRAPHY;  Surgeon: Lyn Records, MD;  Location: [facility] INVASIVE CV LAB;  Service: Cardiovascular;  Laterality: N/A;   LEFT HEART CATHETERIZATION WITH CORONARY ANGIOGRAM N/A 09/03/2013   Procedure: LEFT HEART CATHETERIZATION WITH CORONARY ANGIOGRAM;  Surgeon: Corky Crafts, MD;  Location: [facility] CATH LAB;  Service: Cardiovascular;  Laterality: N/A;   Right carpal tunnel release      Family History  Problem Relation Age of Onset   Diabetes Mother    Hypertension Mother  Heart disease Mother    High Cholesterol Mother    Alzheimer's disease Father    Hypertension Sister    Hypertension Brother    Diabetes Brother    Arthritis Sister    Hypertension Sister    High Cholesterol Sister    Diabetes Son    Developmental delay Son    Colon cancer Neg Hx     Social History   Socioeconomic History   Marital status: Single    Spouse name: Not on file   Number of children: 2   Years of education: Not on file   Highest education level: Not on  file  Occupational History   Occupation: Child psychotherapist    Employer: [employer]  Tobacco Use   Smoking status: Former    Packs/day: .3    Types: Cigarettes    Quit date: 04/01/2012    Years since quitting: 10.5   Smokeless tobacco: Never  Vaping Use   Vaping Use: Never used  Substance and Sexual Activity   Alcohol use: Not Currently    Alcohol/week: 0.0 standard drinks of alcohol    Comment: very occasional   Drug use: No   Sexual activity: Not Currently    Birth control/protection: Post-menopausal  Other Topics Concern   Not on file  Social History Narrative   Not on file   Social Determinants of Health   Financial Resource Strain: Low Risk  (04/05/2022)   Overall Financial Resource Strain (CARDIA)    Difficulty of Paying Living Expenses: Not hard at all  Food Insecurity: No Food Insecurity (04/05/2022)   Hunger Vital Sign    Worried About Running Out of Food in the Last Year: Never true    Ran Out of Food in the Last Year: Never true  Transportation Needs: No Transportation Needs (04/05/2022)   PRAPARE - Administrator, Civil Service (Medical): No    Lack of Transportation (Non-Medical): No  Physical Activity: Insufficiently Active (04/05/2022)   Exercise Vital Sign    Days of Exercise per Week: 2 days    Minutes of Exercise per Session: 10 min  Stress: No Stress Concern Present (04/05/2022)   [employer] of Occupational Health - Occupational Stress Questionnaire    Feeling of Stress : Not at all  Social Connections: Moderately Isolated (04/05/2022)   Social Connection and Isolation Panel [NHANES]    Frequency of Communication with Friends and Family: More than three times a week    Frequency of Social Gatherings with Friends and Family: More than three times a week    Attends Religious Services: 1 to 4 times per year    Active Member of [employer] or Organizations: No    Attends Banker Meetings: Never    Marital Status: Never married   Intimate Partner Violence: Not At Risk (04/05/2022)   Humiliation, Afraid, Rape, and Kick questionnaire    Fear of Current or Ex-Partner: No    Emotionally Abused: No    Physically Abused: No    Sexually Abused: No    Outpatient Medications Prior to Visit  Medication Sig Dispense Refill   acetaminophen (TYLENOL) 500 MG tablet Take 1 tablet (500 mg total) by mouth every 6 (six) hours as needed. 30 tablet 0   albuterol (VENTOLIN HFA) 108 (90 Base) MCG/ACT inhaler INHALE 2 PUFFS BY MOUTH EVERY 6 HOURS AS NEEDED FOR WHEEZING OR SHORTNESS OF BREATH 18 g 0   atorvastatin (LIPITOR) 80 MG tablet Take 1 tablet by mouth once  daily 90 tablet 0   blood glucose meter kit and supplies KIT Dispense based on patient and insurance preference. Use up to four times daily as directed. 1 each 0   botulinum toxin Type A (BOTOX) 200 units injection Provider to inject 155 units into the muscles of the head and neck every 3 months. Discard remainder 1 each 2   conjugated estrogens (PREMARIN) vaginal cream Apply 0.5 gm which is a pea-sized amount with the applicator or fingertip vaginally at bedtime for 2 weeks and then 2-3 x weekly. 42.5 g 12   cyclobenzaprine (FLEXERIL) 10 MG tablet One tablet every twelve hours as needed for spasm. 60 tablet 2   ELIQUIS 5 MG TABS tablet Take 1 tablet by mouth twice daily 180 tablet 0   empagliflozin (JARDIANCE) 10 MG TABS tablet TAKE 1 TABLET BY MOUTH ONCE DAILY BEFORE BREAKFAST 90 tablet 3   fluticasone (FLONASE) 50 MCG/ACT nasal spray Place 2 sprays into both nostrils daily. 16 g 5   Galcanezumab-gnlm (EMGALITY) 120 MG/ML SOAJ INJECT 120 MG  SUBCUTANEOUSLY ONCE EVERY 30 DAYS 1 mL 11   HYDROcodone-acetaminophen (NORCO/VICODIN) 5-325 MG tablet One tablet every four hours as needed for acute pain.  Limit of five days per [place] statue. 30 tablet 0   ipratropium (ATROVENT) 0.03 % nasal spray Place 2 sprays into both nostrils every 12 (twelve) hours. 30 mL 3   Iron-FA-B  Cmp-C-Biot-Probiotic (FUSION PLUS) CAPS Take 1 capsule by mouth daily. 30 capsule 11   meclizine (ANTIVERT) 25 MG tablet Take 1 tablet (25 mg total) by mouth 3 (three) times daily as needed for dizziness. 30 tablet 0   metFORMIN (GLUCOPHAGE) 500 MG tablet TAKE 1 TABLET BY MOUTH TWICE DAILY WITH A MEAL 180 tablet 0   metoCLOPramide (REGLAN) 10 MG tablet Take 1 tablet (10 mg total) by mouth every 8 (eight) hours as needed for up to 7 days for nausea (Headache). 21 tablet 0   metoprolol succinate (TOPROL-XL) 50 MG 24 hr tablet TAKE 1 TABLET BY MOUTH IN THE MORNING AND 1 AT BEDTIME. TAKE WITH OR IMMEDIATELY FOLLOWING A MEAL. 180 tablet 3   pantoprazole (PROTONIX) 40 MG tablet Take 1 tablet by mouth once daily 90 tablet 0   sacubitril-valsartan (ENTRESTO) 97-103 MG Take 1 tablet by mouth 2 (two) times daily. 180 tablet 3   topiramate (TOPAMAX) 50 MG tablet Take 2 tablets (100 mg total) by mouth at bedtime. 180 tablet 3   Ubrogepant (UBRELVY) 50 MG TABS TAKE ONE TABLET BY MOUTH AT THE ONSET OF A MIGRAINE. CAN REPEAT IN 2 HOURS IF NEEDED 10 tablet 11   No facility-administered medications prior to visit.    Allergies  Allergen Reactions   Venlafaxine Nausea And Vomiting and Other (See Comments)    Dizziness, shakiness    ROS Review of Systems  Constitutional:  Negative for chills and fever.  HENT:  Positive for congestion, postnasal drip and sinus pressure. Negative for sore throat.   Eyes:  Negative for pain and discharge.  Respiratory:  Positive for shortness of breath. Negative for cough.   Cardiovascular:  Negative for chest pain and palpitations.  Gastrointestinal:  Negative for abdominal pain, diarrhea, nausea and vomiting.  Endocrine: Negative for polydipsia and polyuria.  Genitourinary:  Negative for dysuria and hematuria.  Musculoskeletal:  Positive for back pain. Negative for neck pain and neck stiffness.  Skin:  Negative for rash.  Neurological:  Positive for headaches. Negative  for weakness.  Psychiatric/Behavioral:  Negative for  agitation and behavioral problems.       Objective:    Physical Exam Vitals reviewed.  Constitutional:      General: She is not in acute distress.    Appearance: She is obese. She is not diaphoretic.  HENT:     Head: Normocephalic and atraumatic.     Nose: Congestion present.     Mouth/Throat:     Mouth: Mucous membranes are moist.  Eyes:     General: No scleral icterus.    Extraocular Movements: Extraocular movements intact.  Cardiovascular:     Rate and Rhythm: Normal rate and regular rhythm.     Pulses: Normal pulses.     Heart sounds: Normal heart sounds. No murmur heard. Pulmonary:     Breath sounds: Normal breath sounds. No wheezing or rales.  Musculoskeletal:     Cervical back: Neck supple. No tenderness.     Right lower leg: No edema.     Left lower leg: No edema.  Skin:    General: Skin is warm.     Findings: Erythema (Chronic, over right side of face) present. No rash.  Neurological:     General: No focal deficit present.     Mental Status: She is alert and oriented to person, place, and time.     Cranial Nerves: No cranial nerve deficit.     Sensory: No sensory deficit.     Motor: No weakness.  Psychiatric:        Mood and Affect: Mood normal.        Behavior: Behavior normal.     BP 116/79 (BP Location: Left Arm, Patient Position: Sitting, Cuff Size: Large)   Pulse 88   Ht 5\' 2"  (1.575 m)   Wt 264 lb 6.4 oz (119.9 kg)   SpO2 95%   BMI 48.36 kg/m  Wt Readings from Last 3 Encounters:  10/21/22 264 lb 6.4 oz (119.9 kg)  09/27/22 265 lb (120.2 kg)  09/13/22 263 lb (119.3 kg)    Lab Results  Component Value Date   TSH 3.590 05/16/2022   Lab Results  Component Value Date   WBC 10.4 09/08/2022   HGB 12.9 09/08/2022   HCT 42.8 09/08/2022   MCV 73.9 (L) 09/08/2022   PLT 338 09/08/2022   Lab Results  Component Value Date   NA 145 (H) 05/16/2022   K 3.9 05/16/2022   CO2 19 (L) 05/16/2022    GLUCOSE 106 (H) 05/16/2022   BUN 15 05/16/2022   CREATININE 0.88 05/16/2022   BILITOT <0.2 05/16/2022   ALKPHOS 106 05/16/2022   AST 12 05/16/2022   ALT 12 05/16/2022   PROT 6.8 05/16/2022   ALBUMIN 4.1 05/16/2022   CALCIUM 10.0 05/16/2022   ANIONGAP 8 11/02/2021   EGFR 73 05/16/2022   Lab Results  Component Value Date   CHOL 174 05/16/2022   Lab Results  Component Value Date   HDL 39 (L) 05/16/2022   Lab Results  Component Value Date   LDLCALC 116 (H) 05/16/2022   Lab Results  Component Value Date   TRIG 106 05/16/2022   Lab Results  Component Value Date   CHOLHDL 4.5 (H) 05/16/2022   Lab Results  Component Value Date   HGBA1C 6.1 (H) 05/16/2022      Assessment & Plan:   Problem List Items Addressed This Visit       Cardiovascular and Mediastinum   Migraine (Chronic)    On Emgality and Topamax Has started Botox injections as well  Bernita Raisin PRN Follows up with Neurology      HTN (hypertension) (Chronic)    BP Readings from Last 1 Encounters:  10/21/22 116/79  Well-controlled with Sherryll Burger and Metoprolol Counseled for compliance with the medications Advised DASH diet and moderate exercise/walking, at least 150 mins/week      PAF (paroxysmal atrial fibrillation) (HCC) (Chronic)    Currently in sinus rhythm On Toprol and Eliquis Follows up with Cardiologist      Hypertrophic cardiomegaly    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 Suszanne Conners        Endocrine   DM (diabetes mellitus) (HCC) - Primary (Chronic)    Lab Results  Component Value Date   HGBA1C 6.1 (H) 05/16/2022  Associated with HTN, CHF and  HLD Well-controlled On Metformin and Jardiance Considering her comorbidities, started Ozempic -plan to increase dose as tolerated Decreased metformin dose to 500 mg once daily for now On statin Has an Ophthalmologist      Relevant Medications   Semaglutide,0.25 or 0.5MG /DOS, (OZEMPIC, 0.25 OR 0.5 MG/DOSE,) 2 MG/3ML SOPN   Other Relevant Orders   CMP14+EGFR   Hemoglobin A1c   POCT glycosylated hemoglobin (Hb A1C)     Hematopoietic and Hemostatic   Acquired thrombophilia (HCC)    On Eliquis for history of paroxysmal atrial fibrillation No signs of active bleeding currently Check CBC        Other   Morbid obesity (HCC)    BMI Readings from Last 3 Encounters:  10/21/22 48.36 kg/m  09/27/22 48.47 kg/m  09/13/22 48.10 kg/m  Diet modification advised Mobility limited due to exertional dyspnea Started Ozempic for type II DM.      Relevant Medications   Semaglutide,0.25 or 0.5MG /DOS, (OZEMPIC, 0.25 OR 0.5 MG/DOSE,) 2 MG/3ML SOPN   Other Visit Diagnoses     Nausea       Relevant Medications   ondansetron (ZOFRAN) 4 MG tablet      Meds ordered this encounter  Medications   Semaglutide,0.25 or 0.5MG /DOS, (OZEMPIC, 0.25 OR 0.5 MG/DOSE,) 2 MG/3ML SOPN    Sig: Inject 0.25 mg into the skin every 7 (seven) days for 28 days, THEN 0.5 mg every 7 (seven) days.    Dispense:  3 mL    Refill:  1   ondansetron (ZOFRAN) 4 MG tablet    Sig: Take 1 tablet (4 mg total) by mouth every 8 (eight) hours as needed for nausea or vomiting.    Dispense:  20 tablet    Refill:  0    Follow-up: Return in about 3 months (around 01/21/2023) for DM.    Anabel Halon, MD

## 2022-10-21 NOTE — Assessment & Plan Note (Addendum)
BMI Readings from Last 3 Encounters:  10/21/22 48.36 kg/m  09/27/22 48.47 kg/m  09/13/22 48.10 kg/m   Diet modification advised Mobility limited due to exertional dyspnea Started Ozempic for type II DM.

## 2022-10-21 NOTE — Assessment & Plan Note (Signed)
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 

## 2022-10-21 NOTE — Assessment & Plan Note (Addendum)
Lab Results  Component Value Date   HGBA1C 6.1 (H) 05/16/2022   Associated with HTN, CHF and HLD Well-controlled On Metformin and Jardiance Considering her comorbidities, started Ozempic -plan to increase dose as tolerated Decreased metformin dose to 500 mg once daily for now On statin Has an Ophthalmologist

## 2022-10-21 NOTE — Assessment & Plan Note (Signed)
On Eliquis for history of paroxysmal atrial fibrillation No signs of active bleeding currently Check CBC 

## 2022-10-21 NOTE — Addendum Note (Signed)
Addended byTrena Platt on: 10/21/2022 10:12 AM   Modules accepted: Orders

## 2022-10-21 NOTE — Addendum Note (Signed)
Addended by: Juliane Lack D on: 10/21/2022 10:12 AM   Modules accepted: Orders

## 2022-10-25 ENCOUNTER — Other Ambulatory Visit: Payer: Self-pay | Admitting: Neurology

## 2022-10-28 ENCOUNTER — Telehealth: Payer: Self-pay

## 2022-10-28 ENCOUNTER — Other Ambulatory Visit (HOSPITAL_COMMUNITY): Payer: Self-pay

## 2022-10-28 ENCOUNTER — Encounter (HOSPITAL_COMMUNITY): Payer: Self-pay | Admitting: Hematology

## 2022-10-28 NOTE — Telephone Encounter (Signed)
Pharmacy Patient Advocate Encounter   Received notification from [facility] that prior authorization for Emgality 120MG /ML auto-injectors (migraine) is required/requested.   PA submitted to [facility] via CoverMyMeds Key or (Medicaid) confirmation # [id number] Status is pending

## 2022-10-31 ENCOUNTER — Other Ambulatory Visit (HOSPITAL_COMMUNITY): Payer: Self-pay

## 2022-10-31 NOTE — Telephone Encounter (Signed)
Pharmacy Patient Advocate Encounter  Prior Authorization for Emgality 120MG /ML auto-injectors (migraine) has been APPROVED by [employer] from 05/02/2021 to 05/02/2023.   PA # PA Case: [id number]

## 2022-11-09 ENCOUNTER — Other Ambulatory Visit: Payer: Self-pay | Admitting: Internal Medicine

## 2022-11-17 ENCOUNTER — Telehealth: Payer: Self-pay | Admitting: Neurology

## 2022-11-17 NOTE — Telephone Encounter (Signed)
Dois Davenport called from [employer]. Stated Botox 200 will be delivered on July 23rd.

## 2022-11-18 ENCOUNTER — Ambulatory Visit (HOSPITAL_COMMUNITY)
Admission: RE | Admit: 2022-11-18 | Discharge: 2022-11-18 | Disposition: A | Discharge status: Home / Self Care | Payer: Medicare HMO | Source: Ambulatory Visit | Attending: Cardiology | Admitting: Cardiology

## 2022-11-18 DIAGNOSIS — I422 Other hypertrophic cardiomyopathy: Secondary | ICD-10-CM | POA: Insufficient documentation

## 2022-11-18 LAB — ECHOCARDIOGRAM COMPLETE
AR max vel: 2.52 cm2
AV Area VTI: 2.88 cm2
AV Area mean vel: 2.58 cm2
AV Mean grad: 5 mmHg
AV Peak grad: 9.9 mmHg
Ao pk vel: 1.57 m/s
S' Lateral: 2.5 cm

## 2022-11-19 ENCOUNTER — Other Ambulatory Visit: Payer: Self-pay | Admitting: Neurology

## 2022-11-21 ENCOUNTER — Encounter (HOSPITAL_COMMUNITY): Payer: Self-pay

## 2022-11-21 NOTE — Therapy (Signed)
[facility] at [facility] [address] [place], [place], [address] Phone: [phone]   Fax:  [phone]  Patient Details  Name: Monique Gomez MRN: [id number] Date of Birth: 25-Apr-1956 Referring Provider:  No ref. provider found  Encounter Date: 11/21/2022 PHYSICAL THERAPY DISCHARGE SUMMARY  Visits from Start of Care: 2  Current functional level related to goals / functional outcomes: Only 2 sessions. No progress noted.    Remaining deficits: Same from evaluation. No progress made.    Education / Equipment: NA   Patient agrees to discharge. Patient goals were not met. Patient is being discharged due to not returning since the last visit.    Nelida Meuse, PT 11/21/2022, 11:52 AM  [facility] at [facility] [address] [place], [place], [address] Phone: [phone]   Fax:  [phone]

## 2022-11-22 NOTE — Telephone Encounter (Signed)
We received 200 units of Botox. Pt does not currently have appointment scheduled, she has a bad debt balance that she was made aware of at last appt.

## 2022-11-28 ENCOUNTER — Other Ambulatory Visit: Payer: Self-pay | Admitting: Internal Medicine

## 2022-11-29 ENCOUNTER — Telehealth: Payer: Self-pay | Admitting: Neurology

## 2022-11-29 NOTE — Telephone Encounter (Signed)
Phone rep was advised by Maralyn Sago, NP to message POD 2 regarding an appointment being found for pt to have her Botox in the month of Aug, please call pt.

## 2022-12-01 ENCOUNTER — Ambulatory Visit: Payer: Medicare HMO | Admitting: Urology

## 2022-12-01 NOTE — Progress Notes (Deleted)
Subjective: No diagnosis found.    Consult requested by Christel Mormon MD.  05/05/22: Monique Gomez is a 67 yo female who had UTI symptoms in October and was treated.  She had another episode in November and had Klebsiella that was treated with bactrim.  She was seen again in December with symptoms.  Other cultures showed Mx species. She will respond to the antibioitics.  She has no discharge.  She has no dysuria.  She has frequency but has a high fluid intake with water and cranberry juice.  She has nocturia 3-4x which is worse.  She has no incontinence.  She feels she empties well.  She has burning across her lower abdomen and into her sides.  She has pressure in the suprapubic area with sitting.  The pressure doesn't lateralized but can radiate to the mid lower back.  She had some UTI's several years ago.  She has no history of stones.  She has had no GU surgery.   She has had no hematuria.  She has no GI complaints.  The discomfort is worse with standing and is a dull pain.  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   06/02/22:  [name] returns today in f/u.  Her culture was negative and the CT showed no cause of the pain.   She has started the premarin cream and is voiding without complaints. She didn't get a UA today.    12/01/22: [name] returns today in f/u for her history of UTI's.    ROS:  Review of Systems  HENT:  Positive for congestion.   Respiratory:  Positive for shortness of breath.   Gastrointestinal:  Positive for heartburn.  Musculoskeletal:  Positive for back pain and joint pain.  Neurological:  Positive for headaches.  All other systems reviewed and are negative.   Allergies  Allergen Reactions   Venlafaxine Nausea And Vomiting and Other (See Comments)    Dizziness, shakiness    Past Medical History:  Diagnosis Date   Abdominal fibromatosis    Allergic rhinitis    Anemia    Asthma    Essential hypertension    GERD (gastroesophageal reflux disease)    History  of cardiac catheterization    No significant CAD May 2015   History of migraine headaches    Hyperlipidemia    Iron deficiency anemia 02/05/2021   Myocarditis (HCC)    a. diagnosed in 05/2020 with cath showing normal cors   Obesity    PAF (paroxysmal atrial fibrillation) (HCC) 08/2013   Sleep apnea    Type 2 diabetes mellitus [facility])     Past Surgical History:  Procedure Laterality Date   CATARACT EXTRACTION     CATARACT EXTRACTION W/PHACO Right 04/05/2019   Procedure: CATARACT EXTRACTION PHACO AND INTRAOCULAR LENS PLACEMENT RIGHT EYE (CDE: 3.19);  Surgeon: Fabio Pierce, MD;  Location: [facility];  Service: Ophthalmology;  Laterality: Right;   COLONOSCOPY N/A 02/02/2015   SLF: 1. one colon polyp removed-no source for anemia identified. 2. moderate diverticulosis noted in the sigmoid colon and descending colon 3. the left colon is redundant 4. Rectal bleeding due ot small internal hemorroids 5. Moderate sized external hemorrhoids.   ESOPHAGOGASTRODUODENOSCOPY N/A 02/02/2015   SLF: 1. 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 large hiatal hernia 3. mild non-erosive gastritis 4. No source for anemia identified.    LEFT HEART CATH AND CORONARY ANGIOGRAPHY N/A 05/14/2020   Procedure: LEFT HEART CATH AND CORONARY ANGIOGRAPHY;  Surgeon: Lyn Records, MD;  Location: [facility] INVAS[facility] LAB;  Service: Cardiovascular;  Laterality: N/A;   LEFT HEART CATHETERIZATION WITH CORONARY ANGIOGRAM N/A 09/03/2013   Procedure: LEFT HEART CATHETERIZATION WITH CORONARY ANGIOGRAM;  Surgeon: Corky Crafts, MD;  Location: [facility] CATH LAB;  Service: Cardiovascular;  Laterality: N/A;   Right carpal tunnel release      Social History   Socioeconomic History   Marital status: Single    Spouse name: Not on file   Number of children: 2   Years of education: Not on file   Highest education level: Not on file  Occupational History   Occupation: Child psychotherapist    Employer: [employer]  Tobacco Use   Smoking  status: Former    Current packs/day: 0.00    Types: Cigarettes    Quit date: 04/01/2012    Years since quitting: 10.6   Smokeless tobacco: Never  Vaping Use   Vaping status: Never Used  Substance and Sexual Activity   Alcohol use: Not Currently    Alcohol/week: 0.0 standard drinks of alcohol    Comment: very occasional   Drug use: No   Sexual activity: Not Currently    Birth control/protection: Post-menopausal  Other Topics Concern   Not on file  Social History Narrative   Not on file   Social Determinants of Health   Financial Resource Strain: Low Risk  (04/05/2022)   Overall Financial Resource Strain (CARDIA)    Difficulty of Paying Living Expenses: Not hard at all  Food Insecurity: No Food Insecurity (04/05/2022)   Hunger Vital Sign    Worried About Running Out of Food in the Last Year: Never true    Ran Out of Food in the Last Year: Never true  Transportation Needs: No Transportation Needs (04/05/2022)   PRAPARE - Administrator, Civil Service (Medical): No    Lack of Transportation (Non-Medical): No  Physical Activity: Insufficiently Active (04/05/2022)   Exercise Vital Sign    Days of Exercise per Week: 2 days    Minutes of Exercise per Session: 10 min  Stress: No Stress Concern Present (04/05/2022)   [employer] of Occupational Health - Occupational Stress Questionnaire    Feeling of Stress : Not at all  Social Connections: Moderately Isolated (04/05/2022)   Social Connection and Isolation Panel [NHANES]    Frequency of Communication with Friends and Family: More than three times a week    Frequency of Social Gatherings with Friends and Family: More than three times a week    Attends Religious Services: 1 to 4 times per year    Active Member of [employer] or Organizations: No    Attends Banker Meetings: Never    Marital Status: Never married  Intimate Partner Violence: Not At Risk (04/05/2022)   Humiliation, Afraid, Rape, and Kick  questionnaire    Fear of Current or Ex-Partner: No    Emotionally Abused: No    Physically Abused: No    Sexually Abused: No    Family History  Problem Relation Age of Onset   Diabetes Mother    Hypertension Mother    Heart disease Mother    High Cholesterol Mother    Alzheimer's disease Father    Hypertension Sister    Hypertension Brother    Diabetes Brother    Arthritis Sister    Hypertension Sister    High Cholesterol Sister    Diabetes Son    Developmental delay Son    Colon cancer Neg Hx  Anti-infectives: Anti-infectives (From admission, onward)    None       Current Outpatient Medications  Medication Sig Dispense Refill   acetaminophen (TYLENOL) 500 MG tablet Take 1 tablet (500 mg total) by mouth every 6 (six) hours as needed. 30 tablet 0   albuterol (VENTOLIN HFA) 108 (90 Base) MCG/ACT inhaler INHALE 2 PUFFS BY MOUTH EVERY 6 HOURS AS NEEDED FOR WHEEZING OR SHORTNESS OF BREATH 18 g 0   atorvastatin (LIPITOR) 80 MG tablet Take 1 tablet by mouth once daily 90 tablet 0   blood glucose meter kit and supplies KIT Dispense based on patient and insurance preference. Use up to four times daily as directed. 1 each 0   botulinum toxin Type A (BOTOX) 200 units injection Provider to inject 155 units into the muscles of the head and neck every 3 months. Discard remainder 1 each 2   conjugated estrogens (PREMARIN) vaginal cream Apply 0.5 gm which is a pea-sized amount with the applicator or fingertip vaginally at bedtime for 2 weeks and then 2-3 x weekly. 42.5 g 12   cyclobenzaprine (FLEXERIL) 10 MG tablet One tablet every twelve hours as needed for spasm. 60 tablet 2   ELIQUIS 5 MG TABS tablet Take 1 tablet by mouth twice daily 180 tablet 0   EMGALITY 120 MG/ML SOAJ INJECT 120 MG  SUBCUTANEOUSLY ONCE EVERY MONTH 1 mL 0   empagliflozin (JARDIANCE) 10 MG TABS tablet TAKE 1 TABLET BY MOUTH ONCE DAILY BEFORE BREAKFAST 90 tablet 3   fluticasone (FLONASE) 50 MCG/ACT nasal spray  Place 2 sprays into both nostrils daily. 16 g 5   HYDROcodone-acetaminophen (NORCO/VICODIN) 5-325 MG tablet One tablet every four hours as needed for acute pain.  Limit of five days per [place] statue. 30 tablet 0   ipratropium (ATROVENT) 0.03 % nasal spray Place 2 sprays into both nostrils every 12 (twelve) hours. 30 mL 3   Iron-FA-B Cmp-C-Biot-Probiotic (FUSION PLUS) CAPS Take 1 capsule by mouth daily. 30 capsule 11   meclizine (ANTIVERT) 25 MG tablet Take 1 tablet (25 mg total) by mouth 3 (three) times daily as needed for dizziness. 30 tablet 0   metFORMIN (GLUCOPHAGE) 500 MG tablet TAKE 1 TABLET BY MOUTH TWICE DAILY WITH A MEAL 180 tablet 0   metoCLOPramide (REGLAN) 10 MG tablet Take 1 tablet (10 mg total) by mouth every 8 (eight) hours as needed for up to 7 days for nausea (Headache). 21 tablet 0   metoprolol succinate (TOPROL-XL) 50 MG 24 hr tablet TAKE 1 TABLET BY MOUTH IN THE MORNING AND 1 AT BEDTIME. TAKE WITH OR IMMEDIATELY FOLLOWING A MEAL. 180 tablet 3   ondansetron (ZOFRAN) 4 MG tablet Take 1 tablet (4 mg total) by mouth every 8 (eight) hours as needed for nausea or vomiting. 20 tablet 0   pantoprazole (PROTONIX) 40 MG tablet Take 1 tablet by mouth once daily 90 tablet 0   sacubitril-valsartan (ENTRESTO) 97-103 MG Take 1 tablet by mouth 2 (two) times daily. 180 tablet 3   Semaglutide,0.25 or 0.5MG /DOS, (OZEMPIC, 0.25 OR 0.5 MG/DOSE,) 2 MG/3ML SOPN Inject 0.25 mg into the skin every 7 (seven) days for 28 days, THEN 0.5 mg every 7 (seven) days. 3 mL 1   topiramate (TOPAMAX) 50 MG tablet Take 2 tablets (100 mg total) by mouth at bedtime. 180 tablet 3   Ubrogepant (UBRELVY) 50 MG TABS TAKE ONE TABLET BY MOUTH AT THE ONSET OF A MIGRAINE. CAN REPEAT IN 2 HOURS IF NEEDED 10 tablet 11  No current facility-administered medications for this visit.     Objective: Vital signs in last 24 hours: There were no vitals taken for this visit.  Intake/Output from previous day: No intake/output  data recorded. Intake/Output this shift: @IOTHISSHIFT @   Physical Exam Vitals reviewed.  Constitutional:      Appearance: Normal appearance.  Neurological:     Mental Status: She is alert.     Lab Results:  No results found for this or any previous visit (from the past 24 hour(s)).   BMET No results for input(s): "NA", "K", "CL", "CO2", "GLUCOSE", "BUN", "CREATININE", "CALCIUM" in the last 72 hours. PT/INR No results for input(s): "LABPROT", "INR" in the last 72 hours. ABG No results for input(s): "PHART", "HCO3" in the last 72 hours.  Invalid input(s): "PCO2", "PO2" Recent Results (from the past 2160 hour(s))  Iron and TIBC     Status: None   Collection Time: 09/08/22  9:57 AM  Result Value Ref Range   Iron 40 28 - 170 ug/dL   TIBC 562 130 - 865 ug/dL   Saturation Ratios 13 10.4 - 31.8 %   UIBC 261 ug/dL    Comment: Performed at [facility], [address] [place], [place] [address]  Ferritin     Status: None   Collection Time: 09/08/22  9:57 AM  Result Value Ref Range   Ferritin 81 11 - 307 ng/mL    Comment: Performed at [facility], [address], [place], [place] [address]  CBC with Differential/Platelet     Status: Abnormal   Collection Time: 09/08/22  9:58 AM  Result Value Ref Range   WBC 10.4 4.0 - 10.5 K/uL   RBC 5.79 (H) 3.87 - 5.11 MIL/uL   Hemoglobin 12.9 12.0 - 15.0 g/dL   HCT 84.1 32.4 - 40.1 %   MCV 73.9 (L) 80.0 - 100.0 fL   MCH 22.3 (L) 26.0 - 34.0 pg   MCHC 30.1 30.0 - 36.0 g/dL   RDW 02.7 (H) 25.3 - 66.4 %   Platelets 338 150 - 400 K/uL   nRBC 0.0 0.0 - 0.2 %   Neutrophils Relative % 71 %   Neutro Abs 7.3 1.7 - 7.7 K/uL   Lymphocytes Relative 20 %   Lymphs Abs 2.1 0.7 - 4.0 K/uL   Monocytes Relative 7 %   Monocytes Absolute 0.7 0.1 - 1.0 K/uL   Eosinophils Relative 2 %   Eosinophils Absolute 0.2 0.0 - 0.5 K/uL   Basophils Relative 0 %   Basophils Absolute 0.0 0.0 - 0.1 K/uL   Immature Granulocytes 0 %   Abs Immature Granulocytes 0.04  0.00 - 0.07 K/uL    Comment: Performed at [facility], [address] [place], [place] [address]  POCT glycosylated hemoglobin (Hb A1C)     Status: None   Collection Time: 10/21/22 10:11 AM  Result Value Ref Range   Hemoglobin A1C     HbA1c POC (<> result, manual entry)     HbA1c, POC (prediabetic range)     HbA1c, POC (controlled diabetic range) 6.3 0.0 - 7.0 %  ECHOCARDIOGRAM COMPLETE     Status: None   Collection Time: 11/18/22  9:41 AM  Result Value Ref Range   AR max vel 2.52 cm2   AV Area VTI 2.88 cm2   AV Mean grad 5.0 mmHg   AV Peak grad 9.9 mmHg   Ao pk vel 1.57 m/s   AV Area mean vel 2.58 cm2   S' Lateral  2.50 cm   Est EF 60 - 65%     Studies/Results: No results found. ECHOCARDIOGRAM COMPLETE  Result Date: 11/18/2022    ECHOCARDIOGRAM REPORT   Patient Name:   MIRELLA MILMAN [facility] Date of Exam: 11/18/2022 Medical Rec #:  [id number]         Height:       62.0 in Accession #:    [id number]        Weight:       264.4 lb Date of Birth:  [birth year]         BSA:          2.152 m Patient Age:    67 years          BP:           116/79 mmHg Patient Gender: F                 HR:           68 bpm. [facility] Procedure: 2D Echo, Color Doppler and Cardiac Doppler Indications:    HOCM  History:        Patient has prior history of Echocardiogram examinations, most                 recent 06/23/2020. Hypertrophic Cardiomyopathy, Previous                 Myocardial Infarction, Arrythmias:Atrial Fibrillation; Risk                 Factors:Morbid Obesity, Diabetes, Hypertension, Dyslipidemia and                 Sleep Apnea.  Sonographer:    [facility] Referring Phys: [id number] SAMUEL G MCDOWELL IMPRESSIONS  1. Left ventricular ejection fraction, by estimation, is 60 to 65%. The left ventricle has normal function. The left ventricle has no regional wall motion abnormalities. There is severe asymmetric left ventricular hypertrophy of the septal and basal segments and remainder moderate left ventricular  hypertrophy. Left ventricular diastolic parameters are consistent with Grade I diastolic dysfunction (impaired relaxation).  2. Right ventricular systolic function is normal. The right ventricular size is normal.  3. The mitral valve is normal in structure. Trivial mitral valve regurgitation. No evidence of mitral stenosis.  4. The aortic valve was not well visualized. Aortic valve regurgitation is not visualized. No aortic stenosis is present.  5. Aortic dilatation noted. There is borderline dilatation of the ascending aorta, measuring 39 mm. Comparison(s): No significant change from prior study. FINDINGS  Left Ventricle: Left ventricular ejection fraction, by estimation, is 60 to 65%. The left ventricle has normal function. The left ventricle has no regional wall motion abnormalities. The left ventricular internal cavity size was normal in size. There is  severe asymmetric left ventricular hypertrophy of the septal and basal segments. Left ventricular diastolic parameters are consistent with Grade I diastolic dysfunction (impaired relaxation). Right Ventricle: The right ventricular size is normal. No increase in right ventricular wall thickness. Right ventricular systolic function is normal. Left Atrium: Left atrial size was normal in size. Right Atrium: Right atrial size was normal in size. Pericardium: There is no evidence of pericardial effusion. Mitral Valve: The mitral valve is normal in structure. Trivial mitral valve regurgitation. No evidence of mitral valve stenosis. Tricuspid Valve: The tricuspid valve is normal in structure. Tricuspid valve regurgitation is not demonstrated. No evidence of tricuspid stenosis. Aortic Valve: The aortic valve was not well visualized. Aortic valve regurgitation  is not visualized. No aortic stenosis is present. Aortic valve mean gradient measures 5.0 mmHg. Aortic valve peak gradient measures 9.9 mmHg. Aortic valve area, by VTI measures 2.88 cm. Pulmonic Valve: The pulmonic  valve was not well visualized. Pulmonic valve regurgitation is not visualized. No evidence of pulmonic stenosis. Aorta: The aortic root is normal in size and structure and aortic dilatation noted. There is borderline dilatation of the ascending aorta, measuring 39 mm. Venous: The inferior vena cava was not well visualized. IAS/Shunts: The interatrial septum was not well visualized.  LEFT VENTRICLE PLAX 2D LVIDd:         3.80 cm LVIDs:         2.50 cm LV PW:         1.40 cm LV IVS:        1.80 cm LVOT diam:     2.00 cm LV SV:         88 LV SV Index:   41 LVOT Area:     3.14 cm  RIGHT VENTRICLE RV S prime:     12.40 cm/s TAPSE (M-mode): 2.0 cm LEFT ATRIUM             Index        RIGHT ATRIUM           Index LA diam:        4.20 cm 1.95 cm/m   RA Area:     12.60 cm LA Vol (A2C):   66.0 ml 30.66 ml/m  RA Volume:   28.60 ml  13.29 ml/m LA Vol (A4C):   66.2 ml 30.76 ml/m LA Biplane Vol: 67.1 ml 31.17 ml/m  AORTIC VALVE AV Area (Vmax):    2.52 cm AV Area (Vmean):   2.58 cm AV Area (VTI):     2.88 cm AV Vmax:           157.00 cm/s AV Vmean:          101.000 cm/s AV VTI:            0.305 m AV Peak Grad:      9.9 mmHg AV Mean Grad:      5.0 mmHg LVOT Vmax:         126.00 cm/s LVOT Vmean:        82.900 cm/s LVOT VTI:          0.280 m LVOT/AV VTI ratio: 0.92  AORTA Ao Asc diam: 3.90 cm TRICUSPID VALVE TR Peak grad:   34.6 mmHg TR Vmax:        294.00 cm/s  SHUNTS Systemic VTI:  0.28 m Systemic Diam: 2.00 cm Vishnu Priya Mallipeddi Electronically signed by Winfield Rast Mallipeddi Signature Date/Time: 11/18/2022/3:52:22 PM    Final      Assessment/Plan: History of UTI's.  Her culture from 1/4 had a small growth of Mx species.   Lower abdominal and right flank pain/burning.  The CT showed no cause for the pain.  She will proceed with PT.   Vaginal atrophy.  She will continue the premarin cream and return in 6 moths.   No orders of the defined types were placed in this encounter.    No orders of the defined  types were placed in this encounter.    No follow-ups on file.    CC: Dr. Christel Mormon.     Bjorn Pippin 12/01/2022

## 2022-12-20 ENCOUNTER — Other Ambulatory Visit: Payer: Self-pay | Admitting: Neurology

## 2022-12-27 ENCOUNTER — Ambulatory Visit: Payer: Medicare HMO | Admitting: Neurology

## 2022-12-27 VITALS — BP 127/88

## 2022-12-27 DIAGNOSIS — G43709 Chronic migraine without aura, not intractable, without status migrainosus: Secondary | ICD-10-CM

## 2022-12-27 MED ORDER — EMGALITY 120 MG/ML ~~LOC~~ SOAJ: SUBCUTANEOUS | 11 refills | Status: DC

## 2022-12-27 MED ORDER — ONABOTULINUMTOXINA 200 UNITS IJ SOLR
155.0000 [IU] | Freq: Once | INTRAMUSCULAR | Status: AC
Start: 2022-12-27 — End: 2022-12-27
  Administered 2022-12-27: 155 [IU] via INTRAMUSCULAR

## 2022-12-27 NOTE — Progress Notes (Signed)
   BOTOX PROCEDURE NOTE FOR MIGRAINE HEADACHE  HISTORY: Here for Botox. Last was 09/27/22 by me. Remains on Emgality, Topamax 100 mg at bedtime. Few more headaches the last few weeks with Botox wearing off, sinus issues month of August. June and July were good, no migraines. Here with her daughter.   Description of procedure:  The patient was placed in a sitting position. The standard protocol was used for Botox as follows, with 5 units of Botox injected at each site:   -Procerus muscle, midline injection  -Corrugator muscle, bilateral injection  -Frontalis muscle, bilateral injection, with 2 sites each side, medial injection was performed in the upper one third of the frontalis muscle, in the region vertical from the medial inferior edge of the superior orbital rim. The lateral injection was again in the upper one third of the forehead vertically above the lateral limbus of the cornea, 1.5 cm lateral to the medial injection site.  -Temporalis muscle injection, 4 sites, bilaterally. The first injection was 3 cm above the tragus of the ear, second injection site was 1.5 cm to 3 cm up from the first injection site in line with the tragus of the ear. The third injection site was 1.5-3 cm forward between the first 2 injection sites. The fourth injection site was 1.5 cm posterior to the second injection site.  -Occipitalis muscle injection, 3 sites, bilaterally. The first injection was done one half way between the occipital protuberance and the tip of the mastoid process behind the ear. The second injection site was done lateral and superior to the first, 1 fingerbreadth from the first injection. The third injection site was 1 fingerbreadth superiorly and medially from the first injection site.  -Cervical paraspinal muscle injection, 2 sites, bilateral, the first injection site was 1 cm from the midline of the cervical spine, 3 cm inferior to the lower border of the occipital protuberance. The second  injection site was 1.5 cm superiorly and laterally to the first injection site.  -Trapezius muscle injection was performed at 3 sites, bilaterally. The first injection site was in the upper trapezius muscle halfway between the inflection point of the neck, and the acromion. The second injection site was one half way between the acromion and the first injection site. The third injection was done between the first injection site and the inflection point of the neck.  A 200 unit bottle of Botox was used, 155 units were injected, the rest of the Botox was wasted. The patient tolerated the procedure well, there were no complications of the above procedure.  Botox NDC 1610-9604-54 Lot number [id number] Expiration date 11/2024 SP  She will continue current medications.

## 2022-12-27 NOTE — Progress Notes (Signed)
Botox- 200 units x 1 vial Lot: [id number] Expiration: 11/2024 NDC: 4540-9811-91  Bacteriostatic 0.9% Sodium Chloride- 4mL  Lot: [id number] Expiration: 08/01/23 NDC: 6213-0865-78  Dx: I69.629 S/P  Witnessed by Athena Masse RN

## 2022-12-28 ENCOUNTER — Ambulatory Visit: Payer: Medicare HMO | Admitting: Neurology

## 2023-01-03 ENCOUNTER — Encounter: Payer: Self-pay | Admitting: Physical Medicine and Rehabilitation

## 2023-01-04 ENCOUNTER — Telehealth: Payer: Self-pay

## 2023-01-04 DIAGNOSIS — M5416 Radiculopathy, lumbar region: Secondary | ICD-10-CM

## 2023-01-04 NOTE — Telephone Encounter (Signed)
Order for repeat L4-5 interlaminar placed

## 2023-01-08 ENCOUNTER — Other Ambulatory Visit: Payer: Self-pay | Admitting: Student

## 2023-01-17 ENCOUNTER — Other Ambulatory Visit: Payer: Self-pay | Admitting: Internal Medicine

## 2023-01-19 ENCOUNTER — Other Ambulatory Visit: Payer: Self-pay | Admitting: Neurology

## 2023-01-23 ENCOUNTER — Encounter: Payer: Self-pay | Admitting: Internal Medicine

## 2023-01-23 ENCOUNTER — Ambulatory Visit (INDEPENDENT_AMBULATORY_CARE_PROVIDER_SITE_OTHER): Payer: Medicare HMO

## 2023-01-23 ENCOUNTER — Ambulatory Visit (INDEPENDENT_AMBULATORY_CARE_PROVIDER_SITE_OTHER): Payer: Medicare HMO | Admitting: Internal Medicine

## 2023-01-23 VITALS — Ht 62.0 in | Wt 264.0 lb

## 2023-01-23 VITALS — BP 120/81 | HR 99 | Ht 62.0 in | Wt 264.0 lb

## 2023-01-23 DIAGNOSIS — J309 Allergic rhinitis, unspecified: Secondary | ICD-10-CM

## 2023-01-23 DIAGNOSIS — K219 Gastro-esophageal reflux disease without esophagitis: Secondary | ICD-10-CM

## 2023-01-23 DIAGNOSIS — G4733 Obstructive sleep apnea (adult) (pediatric): Secondary | ICD-10-CM

## 2023-01-23 DIAGNOSIS — Z1231 Encounter for screening mammogram for malignant neoplasm of breast: Secondary | ICD-10-CM

## 2023-01-23 DIAGNOSIS — I10 Essential (primary) hypertension: Secondary | ICD-10-CM

## 2023-01-23 DIAGNOSIS — Z0001 Encounter for general adult medical examination with abnormal findings: Secondary | ICD-10-CM | POA: Diagnosis not present

## 2023-01-23 DIAGNOSIS — E1169 Type 2 diabetes mellitus with other specified complication: Secondary | ICD-10-CM

## 2023-01-23 DIAGNOSIS — Z0000 Encounter for general adult medical examination without abnormal findings: Secondary | ICD-10-CM

## 2023-01-23 DIAGNOSIS — E782 Mixed hyperlipidemia: Secondary | ICD-10-CM

## 2023-01-23 DIAGNOSIS — Z0100 Encounter for examination of eyes and vision without abnormal findings: Secondary | ICD-10-CM

## 2023-01-23 MED ORDER — ESOMEPRAZOLE MAGNESIUM 40 MG PO CPDR: 40.0000 mg | DELAYED_RELEASE_CAPSULE | Freq: Every day | ORAL | 5 refills | Status: DC

## 2023-01-23 MED ORDER — ATORVASTATIN CALCIUM 80 MG PO TABS
80.0000 mg | ORAL_TABLET | Freq: Every day | ORAL | 3 refills | Status: DC
Start: 2023-01-23 — End: 2023-07-13

## 2023-01-23 MED ORDER — IPRATROPIUM BROMIDE 0.03 % NA SOLN
2.0000 | Freq: Two times a day (BID) | NASAL | 3 refills | Status: AC
Start: 2023-01-23 — End: ?

## 2023-01-23 NOTE — Patient Instructions (Signed)
Please stop taking Pantoprazole and start taking Nexium instead.  Please continue to take medications as prescribed.  Please continue to follow low carb diet and perform moderate exercise/walking as tolerated.

## 2023-01-23 NOTE — Assessment & Plan Note (Addendum)
BMI Readings from Last 3 Encounters:  01/23/23 48.29 kg/m  10/21/22 48.36 kg/m  09/27/22 48.47 kg/m   Diet modification advised Mobility limited due to exertional dyspnea

## 2023-01-23 NOTE — Assessment & Plan Note (Signed)
BP Readings from Last 1 Encounters:  01/23/23 120/81   Well-controlled with Entresto and Metoprolol Counseled for compliance with the medications Advised DASH diet and moderate exercise/walking, at least 150 mins/week

## 2023-01-23 NOTE — Progress Notes (Signed)
Established Patient Office Visit  Subjective:  Patient ID: Monique Gomez, female    DOB: [birth year]  Age: 67 y.o. MRN: [id number]  CC:  Chief Complaint  Patient presents with   Diabetes    Three month follow up    Annual Exam    HPI Monique Gomez is a 67 y.o. female with past medical history of paroxysmal atrial fibrillation, hypertension, myocarditis, OSA on CPAP, GERD, DM, HLD and morbid obesity who presents for f/u of chronic medical conditions.  BP is well-controlled. Takes medications regularly. Patient denies  chest pain or palpitations.  She takes Entresto for history of hypertrophic cardiomegaly.  She is followed by cardiology and heart failure clinic.  She reports exertional dyspnea, but denies any orthopnea or PND.  Denies any leg swelling.  Denies any dizziness or palpitations currently.  She has chronic nasal congestion and postnasal drip.  She had has been using Zyrtec and Flonase for it.  Denies any fever or chills.  She has history of DM, for which she takes metformin.  She had severe nausea and vomiting with Ozempic 0.25 mg dose and had to discontinue it.  She has been also taking Jardiance for history of myocarditis/CHF.  She denies any fatigue, polyuria or polydipsia currently.  She uses CPAP for OSA.   She is on Emgality and Topamax for migraine.  She also gets Botox injections for it.  Followed by neurology.  She has chronic low back pain, for which she has seen orthopedic surgeon and PM&R.  She has had physical therapy without much relief.  She has also had epidural injection in 11/23, but she has recurrence of low back pain for the last 1 month now.  Denies any recent injury or fall.  She has radiating symptoms to bilateral LE at times.  Denies any urinary or stool incontinence.      Past Medical History:  Diagnosis Date   Abdominal fibromatosis    Allergic rhinitis    Anemia    Asthma    Essential hypertension    GERD (gastroesophageal reflux  disease)    History of cardiac catheterization    No significant CAD May 2015   History of migraine headaches    Hyperlipidemia    Iron deficiency anemia 02/05/2021   Myocarditis (HCC)    a. diagnosed in 05/2020 with cath showing normal cors   Obesity    PAF (paroxysmal atrial fibrillation) (HCC) 08/2013   Sleep apnea    Type 2 diabetes mellitus [facility])     Past Surgical History:  Procedure Laterality Date   CATARACT EXTRACTION     CATARACT EXTRACTION W/PHACO Right 04/05/2019   Procedure: CATARACT EXTRACTION PHACO AND INTRAOCULAR LENS PLACEMENT RIGHT EYE (CDE: 3.19);  Surgeon: Fabio Pierce, MD;  Location: [facility];  Service: Ophthalmology;  Laterality: Right;   COLONOSCOPY N/A 02/02/2015   SLF: 1. one colon polyp removed-no source for anemia identified. 2. moderate diverticulosis noted in the sigmoid colon and descending colon 3. the left colon is redundant 4. Rectal bleeding due ot small internal hemorroids 5. Moderate sized external hemorrhoids.   ESOPHAGOGASTRODUODENOSCOPY N/A 02/02/2015   SLF: 1. 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 large hiatal hernia 3. mild non-erosive gastritis 4. No source for anemia identified.    LEFT HEART CATH AND CORONARY ANGIOGRAPHY N/A 05/14/2020   Procedure: LEFT HEART CATH AND CORONARY ANGIOGRAPHY;  Surgeon: Lyn Records, MD;  Location: [facility] INVASIVE [facility] LAB;  Service: Cardiovascular;  Laterality: N/A;  LEFT HEART CATHETERIZATION WITH CORONARY ANGIOGRAM N/A 09/03/2013   Procedure: LEFT HEART CATHETERIZATION WITH CORONARY ANGIOGRAM;  Surgeon: Corky Crafts, MD;  Location: [facility] CATH LAB;  Service: Cardiovascular;  Laterality: N/A;   Right carpal tunnel release      Family History  Problem Relation Age of Onset   Diabetes Mother    Hypertension Mother    Heart disease Mother    High Cholesterol Mother    Alzheimer's disease Father    Hypertension Sister    Hypertension Brother    Diabetes Brother    Arthritis Sister    Hypertension  Sister    High Cholesterol Sister    Diabetes Son    Developmental delay Son    Colon cancer Neg Hx     Social History   Socioeconomic History   Marital status: Single    Spouse name: Not on file   Number of children: 2   Years of education: Not on file   Highest education level: Not on file  Occupational History   Occupation: Child psychotherapist    Employer: [employer]  Tobacco Use   Smoking status: Former    Current packs/day: 0.00    Types: Cigarettes    Quit date: 04/01/2012    Years since quitting: 10.8   Smokeless tobacco: Never  Vaping Use   Vaping status: Never Used  Substance and Sexual Activity   Alcohol use: Not Currently    Alcohol/week: 0.0 standard drinks of alcohol    Comment: very occasional   Drug use: No   Sexual activity: Not Currently    Birth control/protection: Post-menopausal  Other Topics Concern   Not on file  Social History Narrative   Not on file   Social Determinants of Health   Financial Resource Strain: Low Risk  (01/23/2023)   Overall Financial Resource Strain (CARDIA)    Difficulty of Paying Living Expenses: Not hard at all  Food Insecurity: No Food Insecurity (01/23/2023)   Hunger Vital Sign    Worried About Running Out of Food in the Last Year: Never true    Ran Out of Food in the Last Year: Never true  Transportation Needs: No Transportation Needs (01/23/2023)   PRAPARE - Administrator, Civil Service (Medical): No    Lack of Transportation (Non-Medical): No  Physical Activity: Insufficiently Active (01/23/2023)   Exercise Vital Sign    Days of Exercise per Week: 7 days    Minutes of Exercise per Session: 20 min  Stress: No Stress Concern Present (01/23/2023)   [employer] of Occupational Health - Occupational Stress Questionnaire    Feeling of Stress : Not at all  Social Connections: Socially Integrated (01/23/2023)   Social Connection and Isolation Panel [NHANES]    Frequency of Communication with Friends and  Family: More than three times a week    Frequency of Social Gatherings with Friends and Family: More than three times a week    Attends Religious Services: More than 4 times per year    Active Member of [employer] or Organizations: Yes    Attends Banker Meetings: More than 4 times per year    Marital Status: Married  Catering manager Violence: Not At Risk (01/23/2023)   Humiliation, Afraid, Rape, and Kick questionnaire    Fear of Current or Ex-Partner: No    Emotionally Abused: No    Physically Abused: No    Sexually Abused: No    Outpatient Medications Prior to Visit  Medication Sig Dispense Refill   acetaminophen (TYLENOL) 500 MG tablet Take 1 tablet (500 mg total) by mouth every 6 (six) hours as needed. 30 tablet 0   albuterol (VENTOLIN HFA) 108 (90 Base) MCG/ACT inhaler INHALE 2 PUFFS BY MOUTH EVERY 6 HOURS AS NEEDED FOR WHEEZING OR SHORTNESS OF BREATH 18 g 0   blood glucose meter kit and supplies KIT Dispense based on patient and insurance preference. Use up to four times daily as directed. 1 each 0   botulinum toxin Type A (BOTOX) 200 units injection Provider to inject 155 units into the muscles of the head and neck every 3 months. Discard remainder 1 each 2   conjugated estrogens (PREMARIN) vaginal cream Apply 0.5 gm which is a pea-sized amount with the applicator or fingertip vaginally at bedtime for 2 weeks and then 2-3 x weekly. 42.5 g 12   cyclobenzaprine (FLEXERIL) 10 MG tablet One tablet every twelve hours as needed for spasm. 60 tablet 2   ELIQUIS 5 MG TABS tablet Take 1 tablet by mouth twice daily 180 tablet 0   EMGALITY 120 MG/ML SOAJ INJECT 120 MG SUBCUTANEOUSLY ONCE EVERY MONTH 1 mL 0   empagliflozin (JARDIANCE) 10 MG TABS tablet TAKE 1 TABLET BY MOUTH ONCE DAILY BEFORE BREAKFAST 90 tablet 3   fluticasone (FLONASE) 50 MCG/ACT nasal spray Place 2 sprays into both nostrils daily. 16 g 5   HYDROcodone-acetaminophen (NORCO/VICODIN) 5-325 MG tablet One tablet every four  hours as needed for acute pain.  Limit of five days per  statue. 30 tablet 0   Iron-FA-B Cmp-C-Biot-Probiotic (FUSION PLUS) CAPS Take 1 capsule by mouth daily. 30 capsule 11   meclizine (ANTIVERT) 25 MG tablet Take 1 tablet (25 mg total) by mouth 3 (three) times daily as needed for dizziness. 30 tablet 0   metFORMIN (GLUCOPHAGE) 500 MG tablet TAKE 1 TABLET BY MOUTH TWICE DAILY WITH A MEAL 180 tablet 0   metoCLOPramide (REGLAN) 10 MG tablet Take 1 tablet (10 mg total) by mouth every 8 (eight) hours as needed for up to 7 days for nausea (Headache). 21 tablet 0   metoprolol succinate (TOPROL-XL) 50 MG 24 hr tablet TAKE 1 TABLET BY MOUTH IN THE MORNING AND 1 AT BEDTIME. TAKE WITH OR IMMEDIATELY FOLLOWING A MEAL. 180 tablet 3   ondansetron (ZOFRAN) 4 MG tablet Take 1 tablet (4 mg total) by mouth every 8 (eight) hours as needed for nausea or vomiting. 20 tablet 0   sacubitril-valsartan (ENTRESTO) 97-103 MG Take 1 tablet by mouth twice daily 180 tablet 2   topiramate (TOPAMAX) 50 MG tablet Take 2 tablets (100 mg total) by mouth at bedtime. 180 tablet 3   Ubrogepant (UBRELVY) 50 MG TABS TAKE ONE TABLET BY MOUTH AT THE ONSET OF A MIGRAINE. CAN REPEAT IN 2 HOURS IF NEEDED 10 tablet 11   atorvastatin (LIPITOR) 80 MG tablet Take 1 tablet by mouth once daily 90 tablet 0   ipratropium (ATROVENT) 0.03 % nasal spray Place 2 sprays into both nostrils every 12 (twelve) hours. 30 mL 3   pantoprazole (PROTONIX) 40 MG tablet Take 1 tablet by mouth once daily 90 tablet 0   Semaglutide,0.25 or 0.5MG /DOS, (OZEMPIC, 0.25 OR 0.5 MG/DOSE,) 2 MG/3ML SOPN Inject 0.25 mg into the skin every 7 (seven) days for 28 days, THEN 0.5 mg every 7 (seven) days. 3 mL 1   No facility-administered medications prior to visit.    Allergies  Allergen Reactions   Venlafaxine Nausea And Vomiting and  Other (See Comments)    Dizziness, shakiness    ROS Review of Systems  Constitutional:  Negative for chills and fever.  HENT:   Positive for congestion, postnasal drip and sinus pressure. Negative for sore throat.   Eyes:  Negative for pain and discharge.  Respiratory:  Positive for shortness of breath. Negative for cough.   Cardiovascular:  Negative for chest pain and palpitations.  Gastrointestinal:  Negative for abdominal pain, diarrhea, nausea and vomiting.  Endocrine: Negative for polydipsia and polyuria.  Genitourinary:  Negative for dysuria and hematuria.  Musculoskeletal:  Positive for back pain. Negative for neck pain and neck stiffness.  Skin:  Negative for rash.  Neurological:  Positive for headaches. Negative for weakness.  Psychiatric/Behavioral:  Negative for agitation and behavioral problems.       Objective:    Physical Exam Vitals reviewed.  Constitutional:      General: She is not in acute distress.    Appearance: She is obese. She is not diaphoretic.  HENT:     Head: Normocephalic and atraumatic.     Nose: Congestion present.     Mouth/Throat:     Mouth: Mucous membranes are moist.  Eyes:     General: No scleral icterus.    Extraocular Movements: Extraocular movements intact.  Cardiovascular:     Rate and Rhythm: Normal rate and regular rhythm.     Heart sounds: Normal heart sounds. No murmur heard. Pulmonary:     Breath sounds: Normal breath sounds. No wheezing or rales.  Musculoskeletal:     Cervical back: Neck supple. No tenderness.     Right lower leg: No edema.     Left lower leg: No edema.  Skin:    General: Skin is warm.     Findings: Erythema (Chronic, over right side of face) present. No rash.  Neurological:     General: No focal deficit present.     Mental Status: She is alert and oriented to person, place, and time.     Cranial Nerves: No cranial nerve deficit.     Sensory: No sensory deficit.     Motor: No weakness.  Psychiatric:        Mood and Affect: Mood normal.        Behavior: Behavior normal.     BP 120/81 (BP Location: Left Arm, Patient Position:  Sitting, Cuff Size: Large)   Pulse 99   Ht 5\' 2"  (1.575 m)   Wt 264 lb (119.7 kg)   SpO2 96%   BMI 48.29 kg/m  Wt Readings from Last 3 Encounters:  01/23/23 264 lb (119.7 kg)  01/23/23 264 lb (119.7 kg)  10/21/22 264 lb 6.4 oz (119.9 kg)    Lab Results  Component Value Date   TSH 3.590 05/16/2022   Lab Results  Component Value Date   WBC 10.4 09/08/2022   HGB 12.9 09/08/2022   HCT 42.8 09/08/2022   MCV 73.9 (L) 09/08/2022   PLT 338 09/08/2022   Lab Results  Component Value Date   NA 145 (H) 05/16/2022   K 3.9 05/16/2022   CO2 19 (L) 05/16/2022   GLUCOSE 106 (H) 05/16/2022   BUN 15 05/16/2022   CREATININE 0.88 05/16/2022   BILITOT <0.2 05/16/2022   ALKPHOS 106 05/16/2022   AST 12 05/16/2022   ALT 12 05/16/2022   PROT 6.8 05/16/2022   ALBUMIN 4.1 05/16/2022   CALCIUM 10.0 05/16/2022   ANIONGAP 8 11/02/2021   EGFR 73 05/16/2022   Lab Results  Component Value Date   CHOL 174 05/16/2022   Lab Results  Component Value Date   HDL 39 (L) 05/16/2022   Lab Results  Component Value Date   LDLCALC 116 (H) 05/16/2022   Lab Results  Component Value Date   TRIG 106 05/16/2022   Lab Results  Component Value Date   CHOLHDL 4.5 (H) 05/16/2022   Lab Results  Component Value Date   HGBA1C 6.3 10/21/2022      Assessment & Plan:   Problem List Items Addressed This Visit       Cardiovascular and Mediastinum   HTN (hypertension) - Primary (Chronic)    BP Readings from Last 1 Encounters:  01/23/23 120/81   Well-controlled with Entresto and Metoprolol Counseled for compliance with the medications Advised DASH diet and moderate exercise/walking, at least 150 mins/week      Relevant Medications   atorvastatin (LIPITOR) 80 MG tablet     Respiratory   Obstructive sleep apnea (Chronic)    Uses CPAP regularly, continues to benefit from it      Allergic rhinitis    Has chronic nasal congestion and postnasal drip, likely allergic rhinitis Needs to continue  Zyrtec Advised to alternate Zyrtec to Allegra or Claritin Started Atrovent nasal spray instead of Flonase Has seen Dr. Suszanne Conners      Relevant Medications   ipratropium (ATROVENT) 0.03 % nasal spray     Digestive   GERD (gastroesophageal reflux disease) (Chronic)    Persistent symptoms with Pantoprazole 40 mg QD Switched to Nexium If persistent, will refer to GI      Relevant Medications   esomeprazole (NEXIUM) 40 MG capsule     Endocrine   DM (diabetes mellitus) (HCC) (Chronic)    Lab Results  Component Value Date   HGBA1C 6.3 10/21/2022   Associated with HTN, CHF and HLD Well-controlled On Metformin and Jardiance Considering her comorbidities, started Ozempic, but did not tolerate - DC Ozempic Decreased metformin dose to 500 mg once daily for now On statin Has an Ophthalmologist      Relevant Medications   atorvastatin (LIPITOR) 80 MG tablet   Other Relevant Orders   CMP14+EGFR   Hemoglobin A1c     Other   Hyperlipidemia (Chronic)    On Atorvastatin      Relevant Medications   atorvastatin (LIPITOR) 80 MG tablet   Other Relevant Orders   Lipid Profile   Morbid obesity (HCC)    BMI Readings from Last 3 Encounters:  01/23/23 48.29 kg/m  10/21/22 48.36 kg/m  09/27/22 48.47 kg/m   Diet modification advised Mobility limited due to exertional dyspnea       Meds ordered this encounter  Medications   atorvastatin (LIPITOR) 80 MG tablet    Sig: Take 1 tablet (80 mg total) by mouth daily.    Dispense:  90 tablet    Refill:  3   ipratropium (ATROVENT) 0.03 % nasal spray    Sig: Place 2 sprays into both nostrils every 12 (twelve) hours.    Dispense:  30 mL    Refill:  3   esomeprazole (NEXIUM) 40 MG capsule    Sig: Take 1 capsule (40 mg total) by mouth daily.    Dispense:  30 capsule    Refill:  5    Discontinue Pantoprazole.    Follow-up: Return in about 4 months (around 05/25/2023) for Annual physical.    Anabel Halon, MD

## 2023-01-23 NOTE — Patient Instructions (Signed)
Monique Gomez , Thank you for taking time to come for your Medicare Wellness Visit. I appreciate your ongoing commitment to your health goals. Please review the following plan we discussed and let me know if I can assist you in the future.   Referrals/Orders/Follow-Ups/Clinician Recommendations:  Next Medicare Annual Wellness Visit: April 17, 2024 at 11:20am This will be a virtual visit so we will do it via video. If you are unable to do the visit through video please let our office know.   You've been referred to see Dr. Sinda Du for an eye exam. If you haven't heard from his office in about a week, please call to schedule your appointment.   Sinda Du, MD [address] [place] [address]  Phone: [phone]  You have an order for:  []   2D Mammogram  [x]   3D Mammogram  []   Bone Density   []   Lung Cancer Screening  Please call for appointment:   [facility] at [facility] [address] [place], [place] [address] [phone]  Make sure to wear two-piece clothing.  No lotions powders or deodorants the day of the appointment Make sure to bring picture ID and insurance card.  Bring list of medications you are currently taking including any supplements.   Schedule your [place] screening mammogram through MyChart!   Log into your MyChart account.  Go to 'Visit' (or 'Appointments' if on mobile App) --> Schedule an Appointment  Under 'Select a Reason for Visit' choose the Mammogram Screening option.  Complete the pre-visit questions and select the time and place that best fits your schedule.    This is a list of the screening recommended for you and due dates:  Health Maintenance  Topic Date Due   Zoster (Shingles) Vaccine (1 of 2) Never done   COVID-19 Vaccine (3 - Moderna risk series) 08/20/2019   Eye exam for diabetics  07/07/2022   Flu Shot  12/01/2022   Mammogram  03/15/2023   Hemoglobin A1C  04/22/2023   Yearly kidney  function blood test for diabetes  05/17/2023   Yearly kidney health urinalysis for diabetes  05/17/2023   Complete foot exam   05/17/2023   Medicare Annual Wellness Visit  01/23/2024   DEXA scan (bone density measurement)  03/14/2024   Colon Cancer Screening  02/01/2025   DTaP/Tdap/Td vaccine (3 - Td or Tdap) 08/20/2029   Pneumonia Vaccine  Completed   Hepatitis C Screening  Completed   HPV Vaccine  Aged Out    Advanced directives: (ACP Link)Information on Advanced Care Planning can be found at [facility] Advance Health Care Directives Advance Health Care Directives ([email or web address]   Next Medicare Annual Wellness Visit scheduled for next year: Yes  Preventive Care 65 Years and Older, Female Preventive care refers to lifestyle choices and visits with your health care provider that can promote health and wellness. Preventive care visits are also called wellness exams. What can I expect for my preventive care visit? Counseling Your health care provider may ask you questions about your: Medical history, including: Past medical problems. Family medical history. Pregnancy and menstrual history. History of falls. Current health, including: Memory and ability to understand (cognition). Emotional well-being. Home life and relationship well-being. Sexual activity and sexual health. Lifestyle, including: Alcohol, nicotine or tobacco, and drug use. Access to firearms. Diet, exercise, and sleep habits. Work and work Astronomer. Sunscreen use. Safety issues such as seatbelt and bike helmet use. Physical  exam Your health care provider will check your: Height and weight. These may be used to calculate your BMI (body mass index). BMI is a measurement that tells if you are at a healthy weight. Waist circumference. This measures the distance around your waistline. This measurement also tells if you are at a healthy weight and may help predict your risk of certain diseases,  such as type 2 diabetes and high blood pressure. Heart rate and blood pressure. Body temperature. Skin for abnormal spots. What immunizations do I need?  Vaccines are usually given at various ages, according to a schedule. Your health care provider will recommend vaccines for you based on your age, medical history, and lifestyle or other factors, such as travel or where you work. What tests do I need? Screening Your health care provider may recommend screening tests for certain conditions. This may include: Lipid and cholesterol levels. Hepatitis C test. Hepatitis B test. HIV (human immunodeficiency virus) test. STI (sexually transmitted infection) testing, if you are at risk. Lung cancer screening. Colorectal cancer screening. Diabetes screening. This is done by checking your blood sugar (glucose) after you have not eaten for a while (fasting). Mammogram. Talk with your health care provider about how often you should have regular mammograms. BRCA-related cancer screening. This may be done if you have a family history of breast, ovarian, tubal, or peritoneal cancers. Bone density scan. This is done to screen for osteoporosis. Talk with your health care provider about your test results, treatment options, and if necessary, the need for more tests. Follow these instructions at home: Eating and drinking  Eat a diet that includes fresh fruits and vegetables, whole grains, lean protein, and low-fat dairy products. Limit your intake of foods with high amounts of sugar, saturated fats, and salt. Take vitamin and mineral supplements as recommended by your health care provider. Do not drink alcohol if your health care provider tells you not to drink. If you drink alcohol: Limit how much you have to 0-1 drink a day. Know how much alcohol is in your drink. In the [place] one drink equals one 12 oz bottle of beer (355 mL), one 5 oz glass of wine (148 mL), or one 1 oz glass of hard liquor (44  mL). Lifestyle Brush your teeth every morning and night with fluoride toothpaste. Floss one time each day. Exercise for at least 30 minutes 5 or more days each week. Do not use any products that contain nicotine or tobacco. These products include cigarettes, chewing tobacco, and vaping devices, such as e-cigarettes. If you need help quitting, ask your health care provider. Do not use drugs. If you are sexually active, practice safe sex. Use a condom or other form of protection in order to prevent STIs. Take aspirin only as told by your health care provider. Make sure that you understand how much to take and what form to take. Work with your health care provider to find out whether it is safe and beneficial for you to take aspirin daily. Ask your health care provider if you need to take a cholesterol-lowering medicine (statin). Find healthy ways to manage stress, such as: Meditation, yoga, or listening to music. Journaling. Talking to a trusted person. Spending time with friends and family. Minimize exposure to UV radiation to reduce your risk of skin cancer. Safety Always wear your seat belt while driving or riding in a vehicle. Do not drive: If you have been drinking alcohol. Do not ride with someone who has been drinking. When  you are tired or distracted. While texting. If you have been using any mind-altering substances or drugs. Wear a helmet and other protective equipment during sports activities. If you have firearms in your house, make sure you follow all gun safety procedures. What's next? Visit your health care provider once a year for an annual wellness visit. Ask your health care provider how often you should have your eyes and teeth checked. Stay up to date on all vaccines. This information is not intended to replace advice given to you by your health care provider. Make sure you discuss any questions you have with your health care provider. Document Revised: 10/14/2020  Document Reviewed: 10/14/2020 Elsevier Patient Education  2024 [employer]. Understanding Your Risk for Falls Millions of people have serious injuries from falls each year. It is important to understand your risk of falling. Talk with your health care provider about your risk and what you can do to lower it. If you do have a serious fall, make sure to tell your provider. Falling once raises your risk of falling again. How can falls affect me? Serious injuries from falls are common. These include: Broken bones, such as hip fractures. Head injuries, such as traumatic brain injuries (TBI) or concussions. A fear of falling can cause you to avoid activities and stay at home. This can make your muscles weaker and raise your risk for a fall. What can increase my risk? There are a number of risk factors that increase your risk for falling. The more risk factors you have, the higher your risk of falling. Serious injuries from a fall happen most often to people who are older than 66 years old. Teenagers and young adults ages 36-29 are also at higher risk. Common risk factors include: Weakness in the lower body. Being generally weak or confused due to long-term (chronic) illness. Dizziness or balance problems. Poor vision. Medicines that cause dizziness or drowsiness. These may include: Medicines for your blood pressure, heart, anxiety, insomnia, or swelling (edema). Pain medicines. Muscle relaxants. Other risk factors include: Drinking alcohol. Having had a fall in the past. Having foot pain or wearing improper footwear. Working at a dangerous job. Having any of the following in your home: Tripping hazards, such as floor clutter or loose rugs. Poor lighting. Pets. Having dementia or memory loss. What actions can I take to lower my risk of falling?     Physical activity Stay physically fit. Do strength and balance exercises. Consider taking a regular class to build strength and balance.  Yoga and tai chi are good options. Vision Have your eyes checked every year and your prescription for glasses or contacts updated as needed. Shoes and walking aids Wear non-skid shoes. Wear shoes that have rubber soles and low heels. Do not wear high heels. Do not walk around the house in socks or slippers. Use a cane or walker as told by your provider. Home safety Attach secure railings on both sides of your stairs. Install grab bars for your bathtub, shower, and toilet. Use a non-skid mat in your bathtub or shower. Attach bath mats securely with double-sided, non-slip rug tape. Use good lighting in all rooms. Keep a flashlight near your bed. Make sure there is a clear path from your bed to the bathroom. Use night-lights. Do not use throw rugs. Make sure all carpeting is taped or tacked down securely. Remove all clutter from walkways and stairways, including extension cords. Repair uneven or broken steps and floors. Avoid walking on icy or  slippery surfaces. Walk on the grass instead of on icy or slick sidewalks. Use ice melter to get rid of ice on walkways in the winter. Use a cordless phone. Questions to ask your health care provider Can you help me check my risk for a fall? Do any of my medicines make me more likely to fall? Should I take a vitamin D supplement? What exercises can I do to improve my strength and balance? Should I make an appointment to have my vision checked? Do I need a bone density test to check for weak bones (osteoporosis)? Would it help to use a cane or a walker? Where to find more information Centers for Disease Control and Prevention, [employer]AD[employer]: TonerPromos.no Community-Based Fall Prevention Programs: TonerPromos.no [employer] on Aging: BaseRingTones.pl Contact a health care provider if: You fall at home. You are afraid of falling at home. You feel weak, drowsy, or dizzy. This information is not intended to replace advice given to you by your health care provider.  Make sure you discuss any questions you have with your health care provider. Document Revised: 12/20/2021 Document Reviewed: 12/20/2021 Elsevier Patient Education  2024 [employer].

## 2023-01-23 NOTE — Assessment & Plan Note (Signed)
Has chronic nasal congestion and postnasal drip, likely allergic rhinitis Needs to continue Zyrtec Advised to alternate Zyrtec to Allegra or Claritin Started Atrovent nasal spray instead of Flonase Has seen Dr. Suszanne Conners

## 2023-01-23 NOTE — Assessment & Plan Note (Signed)
Uses CPAP regularly, continues to benefit from it ?

## 2023-01-23 NOTE — Assessment & Plan Note (Addendum)
Lab Results  Component Value Date   HGBA1C 6.3 10/21/2022   Associated with HTN, CHF and HLD Well-controlled On Metformin and Jardiance Considering her comorbidities, started Ozempic, but did not tolerate - DC Ozempic Decreased metformin dose to 500 mg once daily for now On statin Has an Ophthalmologist

## 2023-01-23 NOTE — Assessment & Plan Note (Signed)
On Atorvastatin

## 2023-01-23 NOTE — Progress Notes (Signed)
Because this visit was a virtual/telehealth visit,  certain criteria was not obtained, such a blood pressure, CBG if applicable, and timed get up and go. Any medications not marked as "taking" were not mentioned during the medication reconciliation part of the visit. Any vitals not documented were not able to be obtained due to this being a telehealth visit or patient was unable to self-report a recent blood pressure reading due to a lack of equipment at home via telehealth. Vitals that have been documented are verbally provided by the patient.   Subjective:   Monique Gomez is a 67 y.o. female who presents for Medicare Annual (Subsequent) preventive examination.  Visit Complete: Virtual  I connected with  Monique Gomez on 01/23/23 by a audio enabled telemedicine application and verified that I am speaking with the correct person using two identifiers.  Patient Location: Home  Provider Location: Home Office  I discussed the limitations of evaluation and management by telemedicine. The patient expressed understanding and agreed to proceed.  Patient Medicare AWV questionnaire was completed by the patient on n/a; I have confirmed that all information answered by patient is correct and no changes since this date.  Cardiac Risk Factors include: advanced age (>70men, >78 women);diabetes mellitus;dyslipidemia;hypertension;sedentary lifestyle;obesity (BMI >30kg/m2)     Objective:    Today's Vitals   01/23/23 1126 01/23/23 1127  Weight: 264 lb (119.7 kg)   Height: 5\' 2"  (1.575 m)   PainSc:  8    Body mass index is 48.29 kg/m.     01/23/2023   11:25 AM 09/13/2022    1:11 PM 05/20/2022    8:25 AM 04/29/2022    8:21 AM 04/22/2022    8:57 AM 03/15/2022   10:19 AM 01/18/2022    2:01 PM  Advanced Directives  Does Patient Have a Medical Advance Directive? No No No No No No No  Would patient like information on creating a medical advance directive? No - Patient declined No - Patient  declined No - Patient declined No - Patient declined No - Patient declined No - Patient declined No - Patient declined    Current Medications (verified) Outpatient Encounter Medications as of 01/23/2023  Medication Sig   acetaminophen (TYLENOL) 500 MG tablet Take 1 tablet (500 mg total) by mouth every 6 (six) hours as needed.   albuterol (VENTOLIN HFA) 108 (90 Base) MCG/ACT inhaler INHALE 2 PUFFS BY MOUTH EVERY 6 HOURS AS NEEDED FOR WHEEZING OR SHORTNESS OF BREATH   atorvastatin (LIPITOR) 80 MG tablet Take 1 tablet (80 mg total) by mouth daily.   blood glucose meter kit and supplies KIT Dispense based on patient and insurance preference. Use up to four times daily as directed.   botulinum toxin Type A (BOTOX) 200 units injection Provider to inject 155 units into the muscles of the head and neck every 3 months. Discard remainder   conjugated estrogens (PREMARIN) vaginal cream Apply 0.5 gm which is a pea-sized amount with the applicator or fingertip vaginally at bedtime for 2 weeks and then 2-3 x weekly.   cyclobenzaprine (FLEXERIL) 10 MG tablet One tablet every twelve hours as needed for spasm.   ELIQUIS 5 MG TABS tablet Take 1 tablet by mouth twice daily   EMGALITY 120 MG/ML SOAJ INJECT 120 MG SUBCUTANEOUSLY ONCE EVERY MONTH   empagliflozin (JARDIANCE) 10 MG TABS tablet TAKE 1 TABLET BY MOUTH ONCE DAILY BEFORE BREAKFAST   esomeprazole (NEXIUM) 40 MG capsule Take 1 capsule (40 mg total) by mouth daily.  fluticasone (FLONASE) 50 MCG/ACT nasal spray Place 2 sprays into both nostrils daily.   HYDROcodone-acetaminophen (NORCO/VICODIN) 5-325 MG tablet One tablet every four hours as needed for acute pain.  Limit of five days per [place].   ipratropium (ATROVENT) 0.03 % nasal spray Place 2 sprays into both nostrils every 12 (twelve) hours.   Iron-FA-B Cmp-C-Biot-Probiotic (FUSION PLUS) CAPS Take 1 capsule by mouth daily.   meclizine (ANTIVERT) 25 MG tablet Take 1 tablet (25 mg total) by mouth 3  (three) times daily as needed for dizziness.   metFORMIN (GLUCOPHAGE) 500 MG tablet TAKE 1 TABLET BY MOUTH TWICE DAILY WITH A MEAL   metoCLOPramide (REGLAN) 10 MG tablet Take 1 tablet (10 mg total) by mouth every 8 (eight) hours as needed for up to 7 days for nausea (Headache).   metoprolol succinate (TOPROL-XL) 50 MG 24 hr tablet TAKE 1 TABLET BY MOUTH IN THE MORNING AND 1 AT BEDTIME. TAKE WITH OR IMMEDIATELY FOLLOWING A MEAL.   ondansetron (ZOFRAN) 4 MG tablet Take 1 tablet (4 mg total) by mouth every 8 (eight) hours as needed for nausea or vomiting.   sacubitril-valsartan (ENTRESTO) 97-103 MG Take 1 tablet by mouth twice daily   topiramate (TOPAMAX) 50 MG tablet Take 2 tablets (100 mg total) by mouth at bedtime.   Ubrogepant (UBRELVY) 50 MG TABS TAKE ONE TABLET BY MOUTH AT THE ONSET OF A MIGRAINE. CAN REPEAT IN 2 HOURS IF NEEDED   [DISCONTINUED] atorvastatin (LIPITOR) 80 MG tablet Take 1 tablet by mouth once daily   [DISCONTINUED] atorvastatin (LIPITOR) 80 MG tablet Take 1 tablet by mouth once daily   [DISCONTINUED] ELIQUIS 5 MG TABS tablet Take 1 tablet by mouth twice daily   [DISCONTINUED] Galcanezumab-gnlm (EMGALITY) 120 MG/ML SOAJ INJECT 120 MG  SUBCUTANEOUSLY ONCE EVERY 30 DAYS   [DISCONTINUED] ipratropium (ATROVENT) 0.03 % nasal spray Place 2 sprays into both nostrils every 12 (twelve) hours.   [DISCONTINUED] metFORMIN (GLUCOPHAGE) 500 MG tablet TAKE 1 TABLET BY MOUTH TWICE DAILY WITH A MEAL   [DISCONTINUED] pantoprazole (PROTONIX) 40 MG tablet Take 1 tablet by mouth once daily   [DISCONTINUED] pantoprazole (PROTONIX) 40 MG tablet Take 1 tablet by mouth once daily   [DISCONTINUED] sacubitril-valsartan (ENTRESTO) 97-103 MG Take 1 tablet by mouth 2 (two) times daily.   [DISCONTINUED] Semaglutide,0.25 or 0.5MG /DOS, (OZEMPIC, 0.25 OR 0.5 MG/DOSE,) 2 MG/3ML SOPN Inject 0.25 mg into the skin every 7 (seven) days for 28 days, THEN 0.5 mg every 7 (seven) days.   No facility-administered encounter  medications on file as of 01/23/2023.    Allergies (verified) Venlafaxine   History: Past Medical History:  Diagnosis Date   Abdominal fibromatosis    Allergic rhinitis    Anemia    Asthma    Essential hypertension    GERD (gastroesophageal reflux disease)    History of cardiac catheterization    No significant CAD May 2015   History of migraine headaches    Hyperlipidemia    Iron deficiency anemia 02/05/2021   Myocarditis (HCC)    a. diagnosed in 05/2020 with cath showing normal cors   Obesity    PAF (paroxysmal atrial fibrillation) (HCC) 08/2013   Sleep apnea    Type 2 diabetes mellitus [facility])    Past Surgical History:  Procedure Laterality Date   CATARACT EXTRACTION     CATARACT EXTRACTION W/PHACO Right 04/05/2019   Procedure: CATARACT EXTRACTION PHACO AND INTRAOCULAR LENS PLACEMENT RIGHT EYE (CDE: 3.19);  Surgeon: Fabio Pierce, MD;  Location:  [facility];  Service: Ophthalmology;  Laterality: Right;   COLONOSCOPY N/A 02/02/2015   SLF: 1. one colon polyp removed-no source for anemia identified. 2. moderate diverticulosis noted in the sigmoid colon and descending colon 3. the left colon is redundant 4. Rectal bleeding due ot small internal hemorroids 5. Moderate sized external hemorrhoids.   ESOPHAGOGASTRODUODENOSCOPY N/A 02/02/2015   SLF: 1. 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 large hiatal hernia 3. mild non-erosive gastritis 4. No source for anemia identified.    LEFT HEART CATH AND CORONARY ANGIOGRAPHY N/A 05/14/2020   Procedure: LEFT HEART CATH AND CORONARY ANGIOGRAPHY;  Surgeon: Lyn Records, MD;  Location: [facility] INVASIVE CV LAB;  Service: Cardiovascular;  Laterality: N/A;   LEFT HEART CATHETERIZATION WITH CORONARY ANGIOGRAM N/A 09/03/2013   Procedure: LEFT HEART CATHETERIZATION WITH CORONARY ANGIOGRAM;  Surgeon: Corky Crafts, MD;  Location: [facility] CATH LAB;  Service: Cardiovascular;  Laterality: N/A;   Right carpal tunnel release     Family History  Problem  Relation Age of Onset   Diabetes Mother    Hypertension Mother    Heart disease Mother    High Cholesterol Mother    Alzheimer's disease Father    Hypertension Sister    Hypertension Brother    Diabetes Brother    Arthritis Sister    Hypertension Sister    High Cholesterol Sister    Diabetes Son    Developmental delay Son    Colon cancer Neg Hx    Social History   Socioeconomic History   Marital status: Single    Spouse name: Not on file   Number of children: 2   Years of education: Not on file   Highest education level: Not on file  Occupational History   Occupation: Child psychotherapist    Employer: [employer]  Tobacco Use   Smoking status: Former    Current packs/day: 0.00    Types: Cigarettes    Quit date: 04/01/2012    Years since quitting: 10.8   Smokeless tobacco: Never  Vaping Use   Vaping status: Never Used  Substance and Sexual Activity   Alcohol use: Not Currently    Alcohol/week: 0.0 standard drinks of alcohol    Comment: very occasional   Drug use: No   Sexual activity: Not Currently    Birth control/protection: Post-menopausal  Other Topics Concern   Not on file  Social History Narrative   Not on file   Social Determinants of Health   Financial Resource Strain: Low Risk  (01/23/2023)   Overall Financial Resource Strain (CARDIA)    Difficulty of Paying Living Expenses: Not hard at all  Food Insecurity: No Food Insecurity (01/23/2023)   Hunger Vital Sign    Worried About Running Out of Food in the Last Year: Never true    Ran Out of Food in the Last Year: Never true  Transportation Needs: No Transportation Needs (01/23/2023)   PRAPARE - Administrator, Civil Service (Medical): No    Lack of Transportation (Non-Medical): No  Physical Activity: Insufficiently Active (01/23/2023)   Exercise Vital Sign    Days of Exercise per Week: 7 days    Minutes of Exercise per Session: 20 min  Stress: No Stress Concern Present (01/23/2023)   [employer] of Occupational Health - Occupational Stress Questionnaire    Feeling of Stress : Not at all  Social Connections: Socially Integrated (01/23/2023)   Social Connection and Isolation Panel [NHANES]    Frequency of  Communication with Friends and Family: More than three times a week    Frequency of Social Gatherings with Friends and Family: More than three times a week    Attends Religious Services: More than 4 times per year    Active Member of [employer] or Organizations: Yes    Attends Engineer, structural: More than 4 times per year    Marital Status: Married    Tobacco Counseling Counseling given: Yes   Clinical Intake:  Pre-visit preparation completed: Yes  Pain : 0-10 Pain Score: 8  Pain Type: Chronic pain Pain Location: Back Pain Orientation: Lower Pain Descriptors / Indicators: Constant, Aching Pain Onset: More than a month ago Pain Frequency: Constant     BMI - recorded: 48.29 Nutritional Status: BMI > 30  Obese Nutritional Risks: None Diabetes: Yes CBG done?: No (telehealth visit. unable to obtain cbg.) Did pt. bring in CBG monitor from home?: No  How often do you need to have someone help you when you read instructions, pamphlets, or other written materials from your doctor or pharmacy?: 1 - Never  Interpreter Needed?: No  Information entered by :: Abby Mattis Featherly, CMA   Activities of Daily Living    01/23/2023   11:38 AM  In your present state of health, do you have any difficulty performing the following activities:  Hearing? 0  Vision? 0  Difficulty concentrating or making decisions? 0  Walking or climbing stairs? 0  Dressing or bathing? 0  Doing errands, shopping? 0  Preparing Food and eating ? N  Using the Toilet? N  In the past six months, have you accidently leaked urine? N  Do you have problems with loss of bowel control? N  Managing your Medications? N  Managing your Finances? N  Housekeeping or managing your Housekeeping? N     Patient Care Team: Anabel Halon, MD as PCP - General (Internal Medicine) Jonelle Sidle, MD as PCP - Cardiology (Cardiology) West Bali, MD (Inactive) as Consulting Physician (Gastroenterology) Doreatha Massed, MD as Consulting Physician (Hematology) Mickie Bail, RN as Oncology Nurse Navigator (Oncology)  Indicate any recent Medical Services you may have received from other than [facility] providers in the past year (date may be approximate).     Assessment:   This is a routine wellness examination for Monique Gomez.  Hearing/Vision screen Hearing Screening - Comments:: Patient denies any hearing difficulties.   Vision Screening - Comments:: Patient needs an eye doctor. Referral placed for her today.    Goals Addressed             This Visit's Progress    Patient Stated       Continue to lose weight       Depression Screen    01/23/2023   11:30 AM 01/23/2023    8:57 AM 10/21/2022    9:21 AM 05/16/2022    8:10 AM 04/05/2022    1:37 PM 03/31/2022   10:05 AM 01/18/2022    2:01 PM  PHQ 2/9 Scores  PHQ - 2 Score 0 0 0 0 0 0 0  PHQ- 9 Score     1      Fall Risk    01/23/2023   11:37 AM 01/23/2023    8:56 AM 10/21/2022    9:21 AM 05/16/2022    8:10 AM 03/31/2022   10:05 AM  Fall Risk   Falls in the past year? 0 0 0 0 0  Number falls in past yr: 0 0 0  0 0  Injury with Fall? 0 0 0 0 0  Risk for fall due to : No Fall Risks    No Fall Risks  Follow up Falls prevention discussed    Falls evaluation completed    MEDICARE RISK AT HOME: Medicare Risk at Home Any stairs in or around the home?: No If so, are there any without handrails?: No Home free of loose throw rugs in walkways, pet beds, electrical cords, etc?: Yes Adequate lighting in your home to reduce risk of falls?: No Life alert?: Yes Use of a cane, walker or w/c?: No Grab bars in the bathroom?: Yes Shower chair or bench in shower?: Yes Elevated toilet seat or a handicapped toilet?: No  TIMED UP  AND GO:  Was the test performed?  No    Cognitive Function:    01/18/2022    2:02 PM  MMSE - Mini Mental State Exam  Not completed: Unable to complete        01/23/2023   11:29 AM 01/18/2022    2:02 PM  6CIT Screen  What Year? 0 points 0 points  What month? 0 points 0 points  What time? 0 points 0 points  Count back from 20 0 points 0 points  Months in reverse 0 points 2 points  Repeat phrase 0 points 0 points  Total Score 0 points 2 points    Immunizations Immunization History  Administered Date(s) Administered   Fluad Quad(high Dose 65+) 01/18/2022   Influenza,inj,Quad PF,6+ Mos 01/14/2019, 05/19/2020   Moderna Sars-Covid-2 Vaccination 06/24/2019, 07/23/2019   PNEUMOCOCCAL CONJUGATE-20 11/10/2021   PPD Test 08/25/2010   Pneumococcal Polysaccharide-23 12/06/2013   Td 12/23/2009   Tdap 08/21/2019    TDAP status: Up to date  Flu Vaccine status: Due, Education has been provided regarding the importance of this vaccine. Advised may receive this vaccine at local pharmacy or Health Dept. Aware to provide a copy of the vaccination record if obtained from local pharmacy or Health Dept. Verbalized acceptance and understanding.  Pneumococcal vaccine status: Up to date  Covid-19 vaccine status: Information provided on how to obtain vaccines.   Qualifies for Shingles Vaccine? Yes   Zostavax completed No   Shingrix Completed?: No.    Education has been provided regarding the importance of this vaccine. Patient has been advised to call insurance company to determine out of pocket expense if they have not yet received this vaccine. Advised may also receive vaccine at local pharmacy or Health Dept. Verbalized acceptance and understanding.  Screening Tests Health Maintenance  Topic Date Due   Zoster Vaccines- Shingrix (1 of 2) Never done   COVID-19 Vaccine (3 - Moderna risk series) 08/20/2019   OPHTHALMOLOGY EXAM  07/07/2022   INFLUENZA VACCINE  12/01/2022   Medicare Annual  Wellness (AWV)  01/19/2023   MAMMOGRAM  03/15/2023   HEMOGLOBIN A1C  04/22/2023   Diabetic kidney evaluation - eGFR measurement  05/17/2023   Diabetic kidney evaluation - Urine ACR  05/17/2023   FOOT EXAM  05/17/2023   DEXA SCAN  03/14/2024   Colonoscopy  02/01/2025   DTaP/Tdap/Td (3 - Td or Tdap) 08/20/2029   Pneumonia Vaccine 42+ Years old  Completed   Hepatitis C Screening  Completed   HPV VACCINES  Aged Out    Health Maintenance  Health Maintenance Due  Topic Date Due   Zoster Vaccines- Shingrix (1 of 2) Never done   COVID-19 Vaccine (3 - Moderna risk series) 08/20/2019   OPHTHALMOLOGY EXAM  07/07/2022  INFLUENZA VACCINE  12/01/2022   Medicare Annual Wellness (AWV)  01/19/2023    Colorectal cancer screening: Type of screening: Colonoscopy. Completed 02/02/2015. Repeat every 10 years  Mammogram status: Ordered 01/23/2023. Pt provided with contact info and advised to call to schedule appt.   Bone Density status: Completed 03/14/2024. Results reflect: Bone density results: OSTEOPOROSIS. Repeat every 2 years.  Lung Cancer Screening: (Low Dose CT Chest recommended if Age 19-80 years, 20 pack-year currently smoking OR have quit w/in 15years.) does not qualify.   Lung Cancer Screening Referral: na  Additional Screening:  Hepatitis C Screening: does not qualify; Completed 10/23/2017  Vision Screening: Recommended annual ophthalmology exams for early detection of glaucoma and other disorders of the eye. Is the patient up to date with their annual eye exam?  Yes  Who is the provider or what is the name of the office in which the patient attends annual eye exams? Referral placed for patient today If pt is not established with a provider, would they like to be referred to a provider to establish care? Yes .   Dental Screening: Recommended annual dental exams for proper oral hygiene  Diabetic Foot Exam: Diabetic Foot Exam: Completed 05/16/2022  Community Resource Referral /  Chronic Care Management: CRR required this visit?  No   CCM required this visit?  No     Plan:     I have personally reviewed and noted the following in the patient's chart:   Medical and social history Use of alcohol, tobacco or illicit drugs  Current medications and supplements including opioid prescriptions. Patient is currently taking opioid prescriptions. Information provided to patient regarding non-opioid alternatives. Patient advised to discuss non-opioid treatment plan with their provider. Functional ability and status Nutritional status Physical activity Advanced directives List of other physicians Hospitalizations, surgeries, and ER visits in previous 12 months Vitals Screenings to include cognitive, depression, and falls Referrals and appointments  In addition, I have reviewed and discussed with patient certain preventive protocols, quality metrics, and best practice recommendations. A written personalized care plan for preventive services as well as general preventive health recommendations were provided to patient.     Jordan Hawks Ilsa Bonello, CMA   01/23/2023   After Visit Summary: (MyChart) Due to this being a telephonic visit, the after visit summary with patients personalized plan was offered to patient via MyChart   Nurse Notes: n/a

## 2023-01-23 NOTE — Assessment & Plan Note (Signed)
Persistent symptoms with Pantoprazole 40 mg QD Switched to Nexium If persistent, will refer to GI

## 2023-01-24 ENCOUNTER — Ambulatory Visit: Payer: Medicare HMO | Admitting: Physical Medicine and Rehabilitation

## 2023-01-24 ENCOUNTER — Other Ambulatory Visit: Payer: Self-pay

## 2023-01-24 VITALS — BP 116/83 | HR 72

## 2023-01-24 DIAGNOSIS — R102 Pelvic and perineal pain: Secondary | ICD-10-CM

## 2023-01-24 DIAGNOSIS — M5416 Radiculopathy, lumbar region: Secondary | ICD-10-CM | POA: Diagnosis not present

## 2023-01-24 DIAGNOSIS — G894 Chronic pain syndrome: Secondary | ICD-10-CM

## 2023-01-24 DIAGNOSIS — M545 Low back pain, unspecified: Secondary | ICD-10-CM

## 2023-01-24 DIAGNOSIS — G8929 Other chronic pain: Secondary | ICD-10-CM

## 2023-01-24 DIAGNOSIS — M47816 Spondylosis without myelopathy or radiculopathy, lumbar region: Secondary | ICD-10-CM | POA: Diagnosis not present

## 2023-01-24 LAB — CMP14+EGFR
ALT: 10 IU/L (ref 0–32)
AST: 15 IU/L (ref 0–40)
Albumin: 4 g/dL (ref 3.9–4.9)
Alkaline Phosphatase: 93 IU/L (ref 44–121)
BUN/Creatinine Ratio: 19 (ref 12–28)
BUN: 17 mg/dL (ref 8–27)
Bilirubin Total: 0.2 mg/dL (ref 0.0–1.2)
CO2: 23 mmol/L (ref 20–29)
Calcium: 9.4 mg/dL (ref 8.7–10.3)
Chloride: 107 mmol/L — ABNORMAL HIGH (ref 96–106)
Creatinine, Ser: 0.89 mg/dL (ref 0.57–1.00)
Globulin, Total: 2.9 g/dL (ref 1.5–4.5)
Glucose: 108 mg/dL — ABNORMAL HIGH (ref 70–99)
Potassium: 4.1 mmol/L (ref 3.5–5.2)
Sodium: 145 mmol/L — ABNORMAL HIGH (ref 134–144)
Total Protein: 6.9 g/dL (ref 6.0–8.5)
eGFR: 71 mL/min/{1.73_m2} (ref >59)

## 2023-01-24 LAB — LIPID PANEL
Chol/HDL Ratio: 5.2 ratio — ABNORMAL HIGH (ref 0.0–4.4)
Cholesterol, Total: 181 mg/dL (ref 100–199)
HDL: 35 mg/dL — ABNORMAL LOW (ref >39)
LDL Chol Calc (NIH): 125 mg/dL — ABNORMAL HIGH (ref 0–99)
Triglycerides: 114 mg/dL (ref 0–149)
VLDL Cholesterol Cal: 21 mg/dL (ref 5–40)

## 2023-01-24 LAB — HEMOGLOBIN A1C
Est. average glucose Bld gHb Est-mCnc: 140 mg/dL
Hgb A1c MFr Bld: 6.5 % — ABNORMAL HIGH (ref 4.8–5.6)

## 2023-01-24 MED ORDER — METHYLPREDNISOLONE ACETATE 80 MG/ML IJ SUSP
80.0000 mg | Freq: Once | INTRAMUSCULAR | Status: AC
  Administered 2023-01-24: 80 mg

## 2023-01-24 NOTE — Progress Notes (Unsigned)
Functional Pain Scale - descriptive words and definitions  Unmanageable (7)  Pain interferes with normal ADL's/nothing seems to help/sleep is very difficult/active distractions are very difficult to concentrate on. Severe range order  Average Pain 9   +Driver, -BT, -Dye Allergies.  Lower back pain on both sides that radiates into the legs and pelvis

## 2023-01-24 NOTE — Patient Instructions (Signed)

## 2023-01-25 NOTE — Procedures (Unsigned)
Lumbar Epidural Steroid Injection - Interlaminar Approach with Fluoroscopic Guidance  Patient: Monique Gomez      Date of Birth: 1955-11-24 MRN: [id number] PCP: Anabel Halon, MD      Visit Date: 01/24/2023   Universal Protocol:     Consent Given By: the patient  Position: PRONE  Additional Comments: Vital signs were monitored before and after the procedure. Patient was prepped and draped in the usual sterile fashion. The correct patient, procedure, and site was verified.   Injection Procedure Details:   Procedure diagnoses: Lumbar radiculopathy [M54.16]   Meds Administered:  Meds ordered this encounter  Medications   methylPREDNISolone acetate (DEPO-MEDROL) injection 80 mg     Laterality: Right  Location/Site:  L4-5  Needle: 4.5 in., 20 ga. Tuohy  Needle Placement: Paramedian epidural  Findings:   -Comments: Excellent flow of contrast into the epidural space.  Procedure Details: Using a paramedian approach from the side mentioned above, the region overlying the inferior lamina was localized under fluoroscopic visualization and the soft tissues overlying this structure were infiltrated with 4 ml. of 1% Lidocaine without Epinephrine. The Tuohy needle was inserted into the epidural space using a paramedian approach.   The epidural space was localized using loss of resistance along with counter oblique bi-planar fluoroscopic views.  After negative aspirate for air, blood, and CSF, a 2 ml. volume of Isovue-250 was injected into the epidural space and the flow of contrast was observed. Radiographs were obtained for documentation purposes.    The injectate was administered into the level noted above.   Additional Comments:  The patient tolerated the procedure well Dressing: 2 x 2 sterile gauze and Band-Aid    Post-procedure details: Patient was observed during the procedure. Post-procedure instructions were reviewed.  Patient left the clinic in stable  condition.

## 2023-01-25 NOTE — Progress Notes (Unsigned)
standpoint I think this is still related to the facet arthropathy of the lower spine.  Her hips move pretty freely on exam and there is no groin pain.  She could always talk to Dr. Hilda Lias if she has more pain in the pelvic or hips area.    Meds & Orders:  Meds ordered this encounter  Medications   methylPREDNISolone acetate (DEPO-MEDROL) injection 80 mg    Orders Placed This Encounter  Procedures   XR C-ARM NO REPORT   Epidural Steroid injection    Follow-up: Return for visit to requesting provider as needed.   Procedures: No procedures performed  Lumbar Epidural Steroid Injection - Interlaminar Approach with  Fluoroscopic Guidance  Patient: Monique Gomez      Date of Birth: [birth year] MRN: [id number] PCP: Anabel Halon, MD      Visit Date: 01/24/2023   Universal Protocol:     Consent Given By: the patient  Position: PRONE  Additional Comments: Vital signs were monitored before and after the procedure. Patient was prepped and draped in the usual sterile fashion. The correct patient, procedure, and site was verified.   Injection Procedure Details:   Procedure diagnoses: Lumbar radiculopathy [M54.16]   Meds Administered:  Meds ordered this encounter  Medications   methylPREDNISolone acetate (DEPO-MEDROL) injection 80 mg     Laterality: Right  Location/Site:  L4-5  Needle: 4.5 in., 20 ga. Tuohy  Needle Placement: Paramedian epidural  Findings:   -Comments: Excellent flow of contrast into the epidural space.  Procedure Details: Using a paramedian approach from the side mentioned above, the region overlying the inferior lamina was localized under fluoroscopic visualization and the soft tissues overlying this structure were infiltrated with 4 ml. of 1% Lidocaine without Epinephrine. The Tuohy needle was inserted into the epidural space using a paramedian approach.   The epidural space was localized using loss of resistance along with counter oblique bi-planar fluoroscopic views.  After negative aspirate for air, blood, and CSF, a 2 ml. volume of Isovue-250 was injected into the epidural space and the flow of contrast was observed. Radiographs were obtained for documentation purposes.    The injectate was administered into the level noted above.   Additional Comments:  The patient tolerated the procedure well Dressing: 2 x 2 sterile gauze and Band-Aid    Post-procedure details: Patient was observed during the procedure. Post-procedure instructions were reviewed.  Patient left the clinic in stable condition.   Clinical History: Narrative & Impression CLINICAL DATA:   Low back pain; radiates down right leg   EXAM: MRI LUMBAR SPINE WITHOUT CONTRAST   TECHNIQUE: Multiplanar, multisequence MR imaging of the lumbar spine was performed. No intravenous contrast was administered.   COMPARISON:  No prior MRI available   FINDINGS: Segmentation:  Standard.   Alignment:  No listhesis.  Mild levocurvature.   Vertebrae:  No fracture, evidence of discitis, or bone lesion.   Conus medullaris and cauda equina: Conus extends to the L2 level. Conus and cauda equina appear normal.   Paraspinal and other soft tissues: Negative.   Disc levels:   T12-L1: Seen only on the sagittal images. No significant disc bulge, spinal canal stenosis, or neural foraminal narrowing.   L1-L2: No significant disc bulge. No spinal canal stenosis or neural foraminal narrowing.   L2-L3: No significant disc bulge. Mild facet arthropathy. No spinal canal stenosis or neural foraminal narrowing.   L3-L4: Moderate to severe facet arthropathy. No spinal canal stenosis or neural foraminal narrowing.  Monique Gomez - 67 y.o. female MRN [id number]  Date of birth: 1955/06/29  Office Visit Note: Visit Date: 01/24/2023 PCP: Anabel Halon, MD Referred by: Anabel Halon, MD  Subjective: Chief Complaint  Patient presents with   Lower Back - Pain   HPI:  Monique Gomez is a 67 y.o. female who comes in today for evaluation and management at the request of Dr. Darreld Mclean and Dr. Fuller [place] for chronic and worsening and severe 9 out of 10 pain in the low back and hips and pelvic region referring into the thighs.  This pain is bilateral.  I saw her in November of last year and completed epidural injection with good relief of symptoms but her symptoms were different at the time it was mainly one-sided radicular complaints at the time.  MRI findings show mainly facet arthropathy with some lateral recess narrowing no high-grade stenosis or nerve compression.  She denies any specific groin pain or trauma or falls.  She says her pain is much worse than when she saw [place] before.  She again rates this as a 9 out of 10 which is really functionally limiting.  Her case is complicated by history of diabetes and morbid obesity as well as prior heart attack and hypertension.  She also suffers from migraine headaches and is taking medication for the migraines and preventative medication for the migraines.  She also is on Eliquis.  She does not report symptoms really past the knees so much.  Her hemoglobin A1c is 6.5 although last tested lab.  She does not carry diagnosis of polyneuropathy.  No electrodiagnostic studies noted.  She has not had any lumbar surgery.  She has had physical therapy in the past and tries to stay active.   I spent more than 30 minutes speaking face-to-face with the patient with 50% of the time in counseling and discussing coordination of care.    Review of Systems  Musculoskeletal:  Positive for back pain and joint pain.  Neurological:  Positive for weakness.  All other  systems reviewed and are negative.  Otherwise per HPI.  Assessment & Plan: Visit Diagnoses:    ICD-10-CM   1. Lumbar radiculopathy  M54.16 XR C-ARM NO REPORT    Epidural Steroid injection    methylPREDNISolone acetate (DEPO-MEDROL) injection 80 mg    2. Spondylosis without myelopathy or radiculopathy, lumbar region  M47.816     3. Chronic bilateral low back pain without sciatica  M54.50    G89.29     4. Pelvic pain  R10.2     5. Chronic pain syndrome  G89.4       Plan: Findings:  1.  Chronic low back pain referral in the hip and thighs consistent with lumbar facet arthropathy and some lateral recess narrowing.  Not frankly radicular at this point but somewhat.  She did get relief with prior epidural injection.  It is referring in the thigh but not really down the leg so much.  At this point we discussed this and decided to complete a repeat epidural injection to see if that would give her benefit.  Consideration would be to lumbar facet medial branch blocks with goal towards radiofrequency ablation.  We also discussed risk and benefit of staying on the Eliquis for injection.  Depending on relief with the epidural would look at facet medial branch blocks.  Would consider regrouping with physical therapy.  As always recommend weight loss.  2.  From a pelvic pain  standpoint I think this is still related to the facet arthropathy of the lower spine.  Her hips move pretty freely on exam and there is no groin pain.  She could always talk to Dr. Hilda Lias if she has more pain in the pelvic or hips area.    Meds & Orders:  Meds ordered this encounter  Medications   methylPREDNISolone acetate (DEPO-MEDROL) injection 80 mg    Orders Placed This Encounter  Procedures   XR C-ARM NO REPORT   Epidural Steroid injection    Follow-up: Return for visit to requesting provider as needed.   Procedures: No procedures performed  Lumbar Epidural Steroid Injection - Interlaminar Approach with  Fluoroscopic Guidance  Patient: Monique Gomez      Date of Birth: [birth year] MRN: [id number] PCP: Anabel Halon, MD      Visit Date: 01/24/2023   Universal Protocol:     Consent Given By: the patient  Position: PRONE  Additional Comments: Vital signs were monitored before and after the procedure. Patient was prepped and draped in the usual sterile fashion. The correct patient, procedure, and site was verified.   Injection Procedure Details:   Procedure diagnoses: Lumbar radiculopathy [M54.16]   Meds Administered:  Meds ordered this encounter  Medications   methylPREDNISolone acetate (DEPO-MEDROL) injection 80 mg     Laterality: Right  Location/Site:  L4-5  Needle: 4.5 in., 20 ga. Tuohy  Needle Placement: Paramedian epidural  Findings:   -Comments: Excellent flow of contrast into the epidural space.  Procedure Details: Using a paramedian approach from the side mentioned above, the region overlying the inferior lamina was localized under fluoroscopic visualization and the soft tissues overlying this structure were infiltrated with 4 ml. of 1% Lidocaine without Epinephrine. The Tuohy needle was inserted into the epidural space using a paramedian approach.   The epidural space was localized using loss of resistance along with counter oblique bi-planar fluoroscopic views.  After negative aspirate for air, blood, and CSF, a 2 ml. volume of Isovue-250 was injected into the epidural space and the flow of contrast was observed. Radiographs were obtained for documentation purposes.    The injectate was administered into the level noted above.   Additional Comments:  The patient tolerated the procedure well Dressing: 2 x 2 sterile gauze and Band-Aid    Post-procedure details: Patient was observed during the procedure. Post-procedure instructions were reviewed.  Patient left the clinic in stable condition.   Clinical History: Narrative & Impression CLINICAL DATA:   Low back pain; radiates down right leg   EXAM: MRI LUMBAR SPINE WITHOUT CONTRAST   TECHNIQUE: Multiplanar, multisequence MR imaging of the lumbar spine was performed. No intravenous contrast was administered.   COMPARISON:  No prior MRI available   FINDINGS: Segmentation:  Standard.   Alignment:  No listhesis.  Mild levocurvature.   Vertebrae:  No fracture, evidence of discitis, or bone lesion.   Conus medullaris and cauda equina: Conus extends to the L2 level. Conus and cauda equina appear normal.   Paraspinal and other soft tissues: Negative.   Disc levels:   T12-L1: Seen only on the sagittal images. No significant disc bulge, spinal canal stenosis, or neural foraminal narrowing.   L1-L2: No significant disc bulge. No spinal canal stenosis or neural foraminal narrowing.   L2-L3: No significant disc bulge. Mild facet arthropathy. No spinal canal stenosis or neural foraminal narrowing.   L3-L4: Moderate to severe facet arthropathy. No spinal canal stenosis or neural foraminal narrowing.

## 2023-01-26 ENCOUNTER — Encounter: Payer: Self-pay | Admitting: Physical Medicine and Rehabilitation

## 2023-02-09 ENCOUNTER — Other Ambulatory Visit: Payer: Self-pay | Admitting: Internal Medicine

## 2023-02-10 ENCOUNTER — Inpatient Hospital Stay: Payer: Medicare HMO | Attending: Hematology

## 2023-02-10 DIAGNOSIS — R591 Generalized enlarged lymph nodes: Secondary | ICD-10-CM | POA: Insufficient documentation

## 2023-02-10 DIAGNOSIS — Z79899 Other long term (current) drug therapy: Secondary | ICD-10-CM | POA: Diagnosis not present

## 2023-02-10 DIAGNOSIS — Z7901 Long term (current) use of anticoagulants: Secondary | ICD-10-CM | POA: Diagnosis not present

## 2023-02-10 DIAGNOSIS — D508 Other iron deficiency anemias: Secondary | ICD-10-CM

## 2023-02-10 DIAGNOSIS — D509 Iron deficiency anemia, unspecified: Secondary | ICD-10-CM | POA: Diagnosis not present

## 2023-02-10 DIAGNOSIS — Z87891 Personal history of nicotine dependence: Secondary | ICD-10-CM | POA: Insufficient documentation

## 2023-02-10 DIAGNOSIS — R718 Other abnormality of red blood cells: Secondary | ICD-10-CM

## 2023-02-10 LAB — CBC WITH DIFFERENTIAL/PLATELET
Abs Immature Granulocytes: 0.03 10*3/uL (ref 0.00–0.07)
Basophils Absolute: 0 10*3/uL (ref 0.0–0.1)
Basophils Relative: 0 %
Eosinophils Absolute: 0.1 10*3/uL (ref 0.0–0.5)
Eosinophils Relative: 1 %
HCT: 42.1 % (ref 36.0–46.0)
Hemoglobin: 12.3 g/dL (ref 12.0–15.0)
Immature Granulocytes: 0 %
Lymphocytes Relative: 20 %
Lymphs Abs: 2.4 10*3/uL (ref 0.7–4.0)
MCH: 21.6 pg — ABNORMAL LOW (ref 26.0–34.0)
MCHC: 29.2 g/dL — ABNORMAL LOW (ref 30.0–36.0)
MCV: 74 fL — ABNORMAL LOW (ref 80.0–100.0)
Monocytes Absolute: 0.9 10*3/uL (ref 0.1–1.0)
Monocytes Relative: 8 %
Neutro Abs: 8.3 10*3/uL — ABNORMAL HIGH (ref 1.7–7.7)
Neutrophils Relative %: 71 %
Platelets: 376 10*3/uL (ref 150–400)
RBC: 5.69 MIL/uL — ABNORMAL HIGH (ref 3.87–5.11)
RDW: 17.6 % — ABNORMAL HIGH (ref 11.5–15.5)
WBC: 11.8 10*3/uL — ABNORMAL HIGH (ref 4.0–10.5)
nRBC: 0 % (ref 0.0–0.2)

## 2023-02-10 LAB — IRON AND TIBC
Iron: 47 ug/dL (ref 28–170)
Saturation Ratios: 14 % (ref 10.4–31.8)
TIBC: 331 ug/dL (ref 250–450)
UIBC: 284 ug/dL

## 2023-02-10 LAB — FERRITIN: Ferritin: 93 ng/mL (ref 11–307)

## 2023-02-16 ENCOUNTER — Inpatient Hospital Stay: Payer: Medicare HMO | Admitting: Oncology

## 2023-02-16 VITALS — BP 119/84 | HR 84 | Temp 97.8°F | Resp 18 | Ht 62.0 in | Wt 265.4 lb

## 2023-02-16 DIAGNOSIS — R590 Localized enlarged lymph nodes: Secondary | ICD-10-CM | POA: Diagnosis not present

## 2023-02-16 DIAGNOSIS — D509 Iron deficiency anemia, unspecified: Secondary | ICD-10-CM

## 2023-02-16 DIAGNOSIS — Z87891 Personal history of nicotine dependence: Secondary | ICD-10-CM | POA: Diagnosis not present

## 2023-02-16 DIAGNOSIS — Z7901 Long term (current) use of anticoagulants: Secondary | ICD-10-CM | POA: Diagnosis not present

## 2023-02-16 DIAGNOSIS — Z79899 Other long term (current) drug therapy: Secondary | ICD-10-CM | POA: Diagnosis not present

## 2023-02-16 DIAGNOSIS — R591 Generalized enlarged lymph nodes: Secondary | ICD-10-CM | POA: Diagnosis not present

## 2023-02-16 NOTE — Progress Notes (Signed)
[facility] [address], [place] [address]   CLINIC:  Medical Oncology/Hematology  PCP:  Anabel Halon, MD [address] [place] [address] [phone]   REASON FOR VISIT:  Follow-up for lymphadenopathy and iron deficiency anemia   PRIOR THERAPY: None   CURRENT THERAPY: Intermittent IV iron, iron tablet daily  INTERVAL HISTORY:   Monique Gomez 67 y.o. female returns for routine follow-up of lymphadenopathy and iron deficiency anemia.  She was last seen by Rojelio Brenner PA-C on 09/13/2022.  In the interim, she denies any hospitalizations, surgeries or changes in her baseline health.    She is followed fairly closely by her PCP for past medical history of atrial fibrillation, GERD, diabetes, hypertension, OSA on CPAP and morbid obesity.  Chronic comorbidities all stable with appropriate medications.  She continues to tolerate and take her oral iron tablet daily.  Denies any obvious bright red blood per rectum, melena or hematochezia.  Reports appetite of 100% energy levels of 80%.  Has 6 out of 10 back pain.  Reports intermittent diarrhea and palpitations.  Has chronic low back pain and receives periodic injections.  She had 1 a few weeks ago.  She has had back pain for several years.  Has trouble walking and standing for long periods of time.    Has chronic sinus issues. See's Dr. Suszanne Conners regularly.  Uses daily antihistamines and several nasal sprays but still has horrible sinus pain and pressure.  Reports pressure is so significant it changes her vision.    Denies any new lumps or bumps.  No new areas of concern in her neck or cervical region.  No recent infections.  ASSESSMENT:  1.  Subcentimeter left supraclavicular adenopathy, waxing and waning - She reported lymphadenopathy since ~September 2020 - Ultrasound soft tissue neck on 01/25/2019 shows left supraclavicular lymph node measuring 1 cm.  A CT of the soft tissue of the neck on the same day  showed 3 small supraclavicular lymph nodes, largest measuring 8-9 mm.  Small posterior triangle lymph node, none more than 9 mm.  Consistent with mild reactive adenitis. - CT soft tissue neck with contrast on 02/22/2019 showed right level 2 lymph node measuring 6 mm unchanged.  Subcentimeter posterior triangle lymph nodes on the right unchanged.  Largest measures 8 mm.  Left level 2 lymph node measures 8.6 mm unchanged.  Cluster of supraclavicular lymph nodes on the left also slightly smaller.  Largest measures 5 mm.  No oropharyngeal masses. - CT soft tissue neck on 06/17/2019 shows left level 2 lymph node measuring 1.2 cm, previously 1.1 cm.  Stable cluster of nonenlarged left supraclavicular lymph nodes. - CT angiogram from 05/13/2020 which showed no lymphadenopathy in the base of the neck.  No thoracic adenopathy.  Diffuse normal-sized mediastinal and axillary lymph nodes noted. - Former smoker.  She smoked 1 pack of cigarettes per week for 35 years.  She quit smoking in 2013. - She sees Dr. Suszanne Conners for chronic sinusitis. - No recent infections or antibiotics - She does not report any fevers, night sweats or weight loss in the last 6 months.      2.  Iron deficiency anemia: - She reports taking iron tablet daily for many years.   - EGD/colonoscopy (02/02/2015): Nonerosive gastritis, no source for anemia identified on EGD.  Polyp and diverticulosis, rectal bleeding from small internal hemorrhoids. - No major bleeding episodes such as epistaxis, hematemesis, hematochezia, or melena - Most recent IV iron with Venofer  1000 mg divided doses in December 2023/January 2024 - Most recent labs (09/08/2022): Hgb 12.9/MCV 73.9, ferritin 81, iron saturation 13%.   - Longstanding history of microcytic erythrocytosis, regardless of hemoglobin levels.  Hemoglobin fractionation cascade was negative, but this does not rule out alpha thalassemia trait.   -We discussed suspected alpha thalassemia trait, but patient DECLINES  genetic testing, as it would be unlikely to change overall manageme   PLAN:  1. Iron deficiency anemia, unspecified iron deficiency anemia type -Labs from 02/10/2023 show a hemoglobin of 12.3, MCV 74.0, iron saturations 14% and ferritin 93. -She currently continues oral iron daily. -No obvious bleeding. -No indication for IV iron at this time. -Continue oral iron supplement.  Will repeat labs in 6 months.   2. Lymphadenopathy of head and neck region -Physical examination today did not reveal any palpable adenopathy in the neck region or axillary region.    -Most recent CBC showed slightly elevated WBC with elevation in absolute neutrophil count likely secondary to recent steroid injection.  -No signs of infection per assessment and discussion with the patient. -Previously adenopathy noted likely to be reactive from flareups of chronic sinusitis. -Recommend she continue follow-up with Dr. Suszanne Conners and we will continue labs every 6 months along with physical assessment. -Please let me know if anything changes and/or you develop additional lumps or bumps.   PLAN SUMMARY: >> Labs in 6 months = CBC/D, ferritin, iron/TIBC >> OFFICE visit after labs     REVIEW OF SYSTEMS:   Review of Systems  HENT:          Chronic sinus issues  Cardiovascular:  Positive for palpitations.  Gastrointestinal:  Positive for diarrhea.  Musculoskeletal:  Positive for arthralgias and back pain.     PHYSICAL EXAM:  ECOG PERFORMANCE STATUS: 1 - Symptomatic but completely ambulatory  Vitals:   02/16/23 1438  BP: 119/84  Pulse: 84  Resp: 18  Temp: 97.8 F (36.6 C)  SpO2: 100%   Filed Weights   02/16/23 1438  Weight: 265 lb 6.4 oz (120.4 kg)   Physical Exam Constitutional:      Appearance: Normal appearance.  Cardiovascular:     Rate and Rhythm: Normal rate and regular rhythm.  Pulmonary:     Effort: Pulmonary effort is normal.     Breath sounds: Normal breath sounds.  Abdominal:     General:  Bowel sounds are normal.     Palpations: Abdomen is soft.  Musculoskeletal:        General: No swelling. Normal range of motion.  Lymphadenopathy:     Head:     Right side of head: No submental, submandibular, tonsillar or preauricular adenopathy.     Left side of head: No submental, submandibular, tonsillar or preauricular adenopathy.     Cervical: No cervical adenopathy.     Upper Body:     Right upper body: No supraclavicular adenopathy.     Left upper body: No supraclavicular adenopathy.  Neurological:     Mental Status: She is alert and oriented to person, place, and time. Mental status is at baseline.     PAST MEDICAL/SURGICAL HISTORY:  Past Medical History:  Diagnosis Date   Abdominal fibromatosis    Allergic rhinitis    Anemia    Asthma    Essential hypertension    GERD (gastroesophageal reflux disease)    History of cardiac catheterization    No significant CAD May 2015   History of migraine headaches    Hyperlipidemia  Iron deficiency anemia 02/05/2021   Myocarditis (HCC)    a. diagnosed in 05/2020 with cath showing normal cors   Obesity    PAF (paroxysmal atrial fibrillation) (HCC) 08/2013   Sleep apnea    Type 2 diabetes mellitus [facility])    Past Surgical History:  Procedure Laterality Date   CATARACT EXTRACTION     CATARACT EXTRACTION W/PHACO Right 04/05/2019   Procedure: CATARACT EXTRACTION PHACO AND INTRAOCULAR LENS PLACEMENT RIGHT EYE (CDE: 3.19);  Surgeon: Fabio Pierce, MD;  Location: [facility];  Service: Ophthalmology;  Laterality: Right;   COLONOSCOPY N/A 02/02/2015   SLF: 1. one colon polyp removed-no source for anemia identified. 2. moderate diverticulosis noted in the sigmoid colon and descending colon 3. the left colon is redundant 4. Rectal bleeding due ot small internal hemorroids 5. Moderate sized external hemorrhoids.   ESOPHAGOGASTRODUODENOSCOPY N/A 02/02/2015   SLF: 1. 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 large hiatal hernia 3.  mild non-erosive gastritis 4. No source for anemia identified.    LEFT HEART CATH AND CORONARY ANGIOGRAPHY N/A 05/14/2020   Procedure: LEFT HEART CATH AND CORONARY ANGIOGRAPHY;  Surgeon: Lyn Records, MD;  Location: [facility] INVASIVE CV LAB;  Service: Cardiovascular;  Laterality: N/A;   LEFT HEART CATHETERIZATION WITH CORONARY ANGIOGRAM N/A 09/03/2013   Procedure: LEFT HEART CATHETERIZATION WITH CORONARY ANGIOGRAM;  Surgeon: Corky Crafts, MD;  Location: [facility] CATH LAB;  Service: Cardiovascular;  Laterality: N/A;   Right carpal tunnel release      SOCIAL HISTORY:  Social History   Socioeconomic History   Marital status: Single    Spouse name: Not on file   Number of children: 2   Years of education: Not on file   Highest education level: Not on file  Occupational History   Occupation: Child psychotherapist    Employer: [employer]  Tobacco Use   Smoking status: Former    Current packs/day: 0.00    Types: Cigarettes    Quit date: 04/01/2012    Years since quitting: 10.8   Smokeless tobacco: Never  Vaping Use   Vaping status: Never Used  Substance and Sexual Activity   Alcohol use: Not Currently    Alcohol/week: 0.0 standard drinks of alcohol    Comment: very occasional   Drug use: No   Sexual activity: Not Currently    Birth control/protection: Post-menopausal  Other Topics Concern   Not on file  Social History Narrative   Not on file   Social Determinants of Health   Financial Resource Strain: Low Risk  (01/23/2023)   Overall Financial Resource Strain (CARDIA)    Difficulty of Paying Living Expenses: Not hard at all  Food Insecurity: No Food Insecurity (01/23/2023)   Hunger Vital Sign    Worried About Running Out of Food in the Last Year: Never true    Ran Out of Food in the Last Year: Never true  Transportation Needs: No Transportation Needs (01/23/2023)   PRAPARE - Administrator, Civil Service (Medical): No    Lack of Transportation (Non-Medical): No  Physical  Activity: Insufficiently Active (01/23/2023)   Exercise Vital Sign    Days of Exercise per Week: 7 days    Minutes of Exercise per Session: 20 min  Stress: No Stress Concern Present (01/23/2023)   [employer] of Occupational Health - Occupational Stress Questionnaire    Feeling of Stress : Not at all  Social Connections: Socially Integrated (01/23/2023)   Social Connection and Isolation Panel [NHANES]  Frequency of Communication with Friends and Family: More than three times a week    Frequency of Social Gatherings with Friends and Family: More than three times a week    Attends Religious Services: More than 4 times per year    Active Member of [employer] or Organizations: Yes    Attends Engineer, structural: More than 4 times per year    Marital Status: Married  Catering manager Violence: Not At Risk (01/23/2023)   Humiliation, Afraid, Rape, and Kick questionnaire    Fear of Current or Ex-Partner: No    Emotionally Abused: No    Physically Abused: No    Sexually Abused: No    FAMILY HISTORY:  Family History  Problem Relation Age of Onset   Diabetes Mother    Hypertension Mother    Heart disease Mother    High Cholesterol Mother    Alzheimer's disease Father    Hypertension Sister    Hypertension Brother    Diabetes Brother    Arthritis Sister    Hypertension Sister    High Cholesterol Sister    Diabetes Son    Developmental delay Son    Colon cancer Neg Hx     CURRENT MEDICATIONS:  Outpatient Encounter Medications as of 02/16/2023  Medication Sig   acetaminophen (TYLENOL) 500 MG tablet Take 1 tablet (500 mg total) by mouth every 6 (six) hours as needed.   albuterol (VENTOLIN HFA) 108 (90 Base) MCG/ACT inhaler INHALE 2 PUFFS BY MOUTH EVERY 6 HOURS AS NEEDED FOR WHEEZING OR SHORTNESS OF BREATH   atorvastatin (LIPITOR) 80 MG tablet Take 1 tablet (80 mg total) by mouth daily.   blood glucose meter kit and supplies KIT Dispense based on patient and insurance  preference. Use up to four times daily as directed.   botulinum toxin Type A (BOTOX) 200 units injection Provider to inject 155 units into the muscles of the head and neck every 3 months. Discard remainder   conjugated estrogens (PREMARIN) vaginal cream Apply 0.5 gm which is a pea-sized amount with the applicator or fingertip vaginally at bedtime for 2 weeks and then 2-3 x weekly.   cyclobenzaprine (FLEXERIL) 10 MG tablet One tablet every twelve hours as needed for spasm.   ELIQUIS 5 MG TABS tablet Take 1 tablet by mouth twice daily   EMGALITY 120 MG/ML SOAJ INJECT 120 MG SUBCUTANEOUSLY ONCE EVERY MONTH   empagliflozin (JARDIANCE) 10 MG TABS tablet TAKE 1 TABLET BY MOUTH ONCE DAILY BEFORE BREAKFAST   esomeprazole (NEXIUM) 40 MG capsule Take 1 capsule (40 mg total) by mouth daily.   fluticasone (FLONASE) 50 MCG/ACT nasal spray Place 2 sprays into both nostrils daily.   HYDROcodone-acetaminophen (NORCO/VICODIN) 5-325 MG tablet One tablet every four hours as needed for acute pain.  Limit of five days per [place] statue.   ipratropium (ATROVENT) 0.03 % nasal spray Place 2 sprays into both nostrils every 12 (twelve) hours.   Iron-FA-B Cmp-C-Biot-Probiotic (FUSION PLUS) CAPS Take 1 capsule by mouth daily.   meclizine (ANTIVERT) 25 MG tablet Take 1 tablet (25 mg total) by mouth 3 (three) times daily as needed for dizziness.   metFORMIN (GLUCOPHAGE) 500 MG tablet TAKE 1 TABLET BY MOUTH TWICE DAILY WITH A MEAL   metoCLOPramide (REGLAN) 10 MG tablet Take 1 tablet (10 mg total) by mouth every 8 (eight) hours as needed for up to 7 days for nausea (Headache).   metoprolol succinate (TOPROL-XL) 50 MG 24 hr tablet TAKE 1 TABLET BY MOUTH  IN THE MORNING AND 1 AT BEDTIME. TAKE WITH OR IMMEDIATELY FOLLOWING A MEAL.   ondansetron (ZOFRAN) 4 MG tablet Take 1 tablet (4 mg total) by mouth every 8 (eight) hours as needed for nausea or vomiting.   sacubitril-valsartan (ENTRESTO) 97-103 MG Take 1 tablet by mouth twice daily    topiramate (TOPAMAX) 50 MG tablet Take 2 tablets (100 mg total) by mouth at bedtime.   Ubrogepant (UBRELVY) 50 MG TABS TAKE ONE TABLET BY MOUTH AT THE ONSET OF A MIGRAINE. CAN REPEAT IN 2 HOURS IF NEEDED   No facility-administered encounter medications on file as of 02/16/2023.    ALLERGIES:  Allergies  Allergen Reactions   Venlafaxine Nausea And Vomiting and Other (See Comments)    Dizziness, shakiness    LABORATORY DATA:  I have reviewed the labs as listed.  CBC    Component Value Date/Time   WBC 11.8 (H) 02/10/2023 1024   RBC 5.69 (H) 02/10/2023 1024   HGB 12.3 02/10/2023 1024   HGB 12.6 05/16/2022 0901   HCT 42.1 02/10/2023 1024   HCT 41.3 05/16/2022 0901   PLT 376 02/10/2023 1024   PLT 311 05/16/2022 0901   MCV 74.0 (L) 02/10/2023 1024   MCV 72 (L) 05/16/2022 0901   MCH 21.6 (L) 02/10/2023 1024   MCHC 29.2 (L) 02/10/2023 1024   RDW 17.6 (H) 02/10/2023 1024   RDW 16.1 (H) 05/16/2022 0901   LYMPHSABS 2.4 02/10/2023 1024   LYMPHSABS 1.7 05/16/2022 0901   MONOABS 0.9 02/10/2023 1024   EOSABS 0.1 02/10/2023 1024   EOSABS 0.2 05/16/2022 0901   BASOSABS 0.0 02/10/2023 1024   BASOSABS 0.0 05/16/2022 0901      Latest Ref Rng & Units 01/23/2023    9:32 AM 05/16/2022    9:01 AM 11/02/2021    9:26 AM  CMP  Glucose 70 - 99 mg/dL 161  096  045   BUN 8 - 27 mg/dL 17  15  20    Creatinine 0.57 - 1.00 mg/dL 4.09  8.11  9.14   Sodium 134 - 144 mmol/L 145  145  140   Potassium 3.5 - 5.2 mmol/L 4.1  3.9  3.6   Chloride 96 - 106 mmol/L 107  109  109   CO2 20 - 29 mmol/L 23  19  23    Calcium 8.7 - 10.3 mg/dL 9.4  78.2  9.0   Total Protein 6.0 - 8.5 g/dL 6.9  6.8    Total Bilirubin 0.0 - 1.2 mg/dL 0.2  <9.5    Alkaline Phos 44 - 121 IU/L 93  106    AST 0 - 40 IU/L 15  12    ALT 0 - 32 IU/L 10  12      DIAGNOSTIC IMAGING:  I have independently reviewed the relevant imaging and discussed with the patient.   WRAP UP:  All questions were answered. The patient knows to call  the clinic with any problems, questions or concerns.  Medical decision making: Moderate  Time spent on visit: I spent 25 minutes dedicated to the care of this patient (face-to-face and non-face-to-face) on the date of the encounter to include what is described in the assessment and plan.  Mauro Kaufmann, NP  02/16/23 2:42 PM

## 2023-03-06 NOTE — Telephone Encounter (Signed)
I called [facility] to set up delivery for the Botox. The rep states there is a call scheduled today for them to outreach the pt and obtain her consent. We prescheduled delivery to come between 11/6-11/14. I informed rep of office hours and that we are closed on Fridays.

## 2023-03-21 ENCOUNTER — Encounter (HOSPITAL_COMMUNITY): Payer: Self-pay | Admitting: Hematology

## 2023-03-21 ENCOUNTER — Ambulatory Visit: Payer: Medicare HMO | Admitting: Neurology

## 2023-03-21 VITALS — BP 124/87 | HR 92

## 2023-03-21 DIAGNOSIS — G43709 Chronic migraine without aura, not intractable, without status migrainosus: Secondary | ICD-10-CM

## 2023-03-21 MED ORDER — ONABOTULINUMTOXINA 200 UNITS IJ SOLR
155.0000 [IU] | Freq: Once | INTRAMUSCULAR | Status: AC
Start: 2023-03-21 — End: 2023-03-21
  Administered 2023-03-21: 155 [IU] via INTRAMUSCULAR

## 2023-03-21 NOTE — Progress Notes (Signed)
   BOTOX PROCEDURE NOTE FOR MIGRAINE HEADACHE  HISTORY: Monique Gomez is here for Botox. Last was 12/27/22 with me. Stopped the Topamax 3 weeks ago. Remains on Emgality. Headaches doing well. Has had ZERO migraines since last Botox. Only took [place] once! Botox is working  super!!  Description of procedure:  The patient was placed in a sitting position. The standard protocol was used for Botox as follows, with 5 units of Botox injected at each site:  -Procerus muscle, midline injection  -Corrugator muscle, bilateral injection  -Frontalis muscle, bilateral injection, with 2 sites each side, medial injection was performed in the upper one third of the frontalis muscle, in the region vertical from the medial inferior edge of the superior orbital rim. The lateral injection was again in the upper one third of the forehead vertically above the lateral limbus of the cornea, 1.5 cm lateral to the medial injection site.  -Temporalis muscle injection, 4 sites, bilaterally. The first injection was 3 cm above the tragus of the ear, second injection site was 1.5 cm to 3 cm up from the first injection site in line with the tragus of the ear. The third injection site was 1.5-3 cm forward between the first 2 injection sites. The fourth injection site was 1.5 cm posterior to the second injection site.  -Occipitalis muscle injection, 3 sites, bilaterally. The first injection was done one half way between the occipital protuberance and the tip of the mastoid process behind the ear. The second injection site was done lateral and superior to the first, 1 fingerbreadth from the first injection. The third injection site was 1 fingerbreadth superiorly and medially from the first injection site.  -Cervical paraspinal muscle injection, 2 sites, bilateral, the first injection site was 1 cm from the midline of the cervical spine, 3 cm inferior to the lower border of the occipital protuberance. The second injection site was 1.5 cm  superiorly and laterally to the first injection site.  -Trapezius muscle injection was performed at 3 sites, bilaterally. The first injection site was in the upper trapezius muscle halfway between the inflection point of the neck, and the acromion. The second injection site was one half way between the acromion and the first injection site. The third injection was done between the first injection site and the inflection point of the neck.   A 200 unit bottle of Botox was used, 155 units were injected, the rest of the Botox was wasted. The patient tolerated the procedure well, there were no complications of the above procedure.  Botox NDC 1478-2956-21 Lot number [id number] Expiration date 06/2025 SP

## 2023-03-21 NOTE — Progress Notes (Signed)
Botox- 200 units x 1 vial Lot: [id number] Expiration: 02.2027 NDC: 0023-3921-02  Bacteriostatic 0.9% Sodium Chloride- 4 mL  Lot: [id number]  Expiration: 04.01.2025 NDC: 0160109323  Dx: G43.709 S/P Witnessed by Cinda Quest, CMA

## 2023-03-21 NOTE — Progress Notes (Signed)
Patient: Monique Gomez Date of Birth: Dec 16, 1955  Reason for Visit: Follow up History from: Patient Primary Neurologist: Dr. Terrace Arabia   ASSESSMENT AND PLAN 67 y.o. year old female   1.  Chronic migraine headache -Under excellent control with Botox, will continue every 43-month injection for migraine prevention -Continue Emgality for migraine prevention -Has been able to stop Topamax -Continue Ubrelvy as needed for acute migraine -Previously tried and failed: Aimovig, Effexor, Imitrex, Maxalt, Fioricet, diclofenac, metoprolol -MRI of the brain 09/15/21 showed no acute abnormality, has been evaluated by ophthalmology, ENT  HISTORY OF PRESENT ILLNESS: Today 03/21/23 Monique Gomez here today for follow-up.  MRI of the brain in May 2023 showed no acute abnormality, it did show mild chronic small vessel ischemic changes, partially empty sella turcica. In the ER 11/02/21 for headache, she was given IV cocktail of IV fluids, magnesium sulfate, Compazine, Toradol, Benadryl, Decadron with resolution of headache.  CT head showed no acute abnormality. Has seen ENT, told inflamed nasal passages. On Flonase. Was told in ER, Flonase causes rebound headaches, when she stopped had swelling of her face. Headaches top of her head, temples. Bernita Raisin helps, but has to use sparingly. Tylenol helps but not as good as Ubrelvy. On Emgality, has done 3 injections so far, not much change. Cannot identify any triggers for migraines, doesn't have any stress, she is retired. Can have dizziness, nausea, vomiting, sometimes sensitive to light/sound. This is her typical migraine pattern.   HISTORY  08/25/21 SS: Here today for follow-up, she couldn't get the virtual visit to work x 3. Sooner appointment. Reports for months, pressure around her eyes, dizziness felt related to sinuses. Sinuses were swollen hard to assess, took oral steroids x 7 days, things cleared. Using nasal spray and Zyrtec. Reports tingling around lips,  feels pressure behind eyes. Not having migraine headaches. Reports pain to occipital area, crown, is like pounding. Is now retired. 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 Ruel Favors with good benefit or Tylenol. She thought all of this was related to sinus issues, but ENT assured her not. Told to come see neurologist. Migraines are typically severe pain, sensitive to light, nauseated. Current headaches are sporadic, few times a week, lasts until [place] or Tylenol. No exacerbating factors. Feel better when lying down. Denies numbness or weakness, no trouble with gait.   REVIEW OF SYSTEMS: Out of a complete 14 system review of symptoms, the patient complains only of the following symptoms, and all other reviewed systems are negative.  See HPI  ALLERGIES: Allergies  Allergen Reactions   Venlafaxine Nausea And Vomiting and Other (See Comments)    Dizziness, shakiness    HOME MEDICATIONS: Outpatient Medications Prior to Visit  Medication Sig Dispense Refill   acetaminophen (TYLENOL) 500 MG tablet Take 1 tablet (500 mg total) by mouth every 6 (six) hours as needed. 30 tablet 0   albuterol (VENTOLIN HFA) 108 (90 Base) MCG/ACT inhaler INHALE 2 PUFFS BY MOUTH EVERY 6 HOURS AS NEEDED FOR WHEEZING OR SHORTNESS OF BREATH 18 g 0   atorvastatin (LIPITOR) 80 MG tablet Take 1 tablet (80 mg total) by mouth daily. 90 tablet 3   blood glucose meter kit and supplies KIT Dispense based on patient and insurance preference. Use up to four times daily as directed. 1 each 0   botulinum toxin Type A (BOTOX) 200 units injection Provider to inject 155 units into the muscles of the head and neck every 3 months. Discard  remainder 1 each 2   conjugated estrogens (PREMARIN) vaginal cream Apply 0.5 gm which is a pea-sized amount with the applicator or fingertip vaginally at bedtime for 2 weeks and then 2-3 x weekly. 42.5 g 12   cyclobenzaprine (FLEXERIL) 10 MG tablet One tablet every  twelve hours as needed for spasm. 60 tablet 2   ELIQUIS 5 MG TABS tablet Take 1 tablet by mouth twice daily 180 tablet 0   EMGALITY 120 MG/ML SOAJ INJECT 120 MG SUBCUTANEOUSLY ONCE EVERY MONTH 1 mL 0   empagliflozin (JARDIANCE) 10 MG TABS tablet TAKE 1 TABLET BY MOUTH ONCE DAILY BEFORE BREAKFAST 90 tablet 3   esomeprazole (NEXIUM) 40 MG capsule Take 1 capsule (40 mg total) by mouth daily. 30 capsule 5   fluticasone (FLONASE) 50 MCG/ACT nasal spray Place 2 sprays into both nostrils daily. 16 g 5   HYDROcodone-acetaminophen (NORCO/VICODIN) 5-325 MG tablet One tablet every four hours as needed for acute pain.  Limit of five days per [place]. 30 tablet 0   ipratropium (ATROVENT) 0.03 % nasal spray Place 2 sprays into both nostrils every 12 (twelve) hours. 30 mL 3   Iron-FA-B Cmp-C-Biot-Probiotic (FUSION PLUS) CAPS Take 1 capsule by mouth daily. 30 capsule 11   meclizine (ANTIVERT) 25 MG tablet Take 1 tablet (25 mg total) by mouth 3 (three) times daily as needed for dizziness. 30 tablet 0   metFORMIN (GLUCOPHAGE) 500 MG tablet TAKE 1 TABLET BY MOUTH TWICE DAILY WITH A MEAL 180 tablet 0   metoCLOPramide (REGLAN) 10 MG tablet Take 1 tablet (10 mg total) by mouth every 8 (eight) hours as needed for up to 7 days for nausea (Headache). 21 tablet 0   metoprolol succinate (TOPROL-XL) 50 MG 24 hr tablet TAKE 1 TABLET BY MOUTH IN THE MORNING AND 1 AT BEDTIME. TAKE WITH OR IMMEDIATELY FOLLOWING A MEAL. 180 tablet 3   ondansetron (ZOFRAN) 4 MG tablet Take 1 tablet (4 mg total) by mouth every 8 (eight) hours as needed for nausea or vomiting. 20 tablet 0   sacubitril-valsartan (ENTRESTO) 97-103 MG Take 1 tablet by mouth twice daily 180 tablet 2   topiramate (TOPAMAX) 50 MG tablet Take 2 tablets (100 mg total) by mouth at bedtime. 180 tablet 3   Ubrogepant (UBRELVY) 50 MG TABS TAKE ONE TABLET BY MOUTH AT THE ONSET OF A MIGRAINE. CAN REPEAT IN 2 HOURS IF NEEDED 10 tablet 11   No facility-administered  medications prior to visit.    PAST MEDICAL HISTORY: Past Medical History:  Diagnosis Date   Abdominal fibromatosis    Allergic rhinitis    Anemia    Asthma    Essential hypertension    GERD (gastroesophageal reflux disease)    History of cardiac catheterization    No significant CAD May 2015   History of migraine headaches    Hyperlipidemia    Iron deficiency anemia 02/05/2021   Myocarditis (HCC)    a. diagnosed in 05/2020 with cath showing normal cors   Obesity    PAF (paroxysmal atrial fibrillation) (HCC) 08/2013   Sleep apnea    Type 2 diabetes mellitus (HCC)     PAST SURGICAL HISTORY: Past Surgical History:  Procedure Laterality Date   CATARACT EXTRACTION     CATARACT EXTRACTION W/PHACO Right 04/05/2019   Procedure: CATARACT EXTRACTION PHACO AND INTRAOCULAR LENS PLACEMENT RIGHT EYE (CDE: 3.19);  Surgeon: Fabio Pierce, MD;  Location: [facility];  Service: Ophthalmology;  Laterality: Right;   COLONOSCOPY N/A 02/02/2015  SLF: 1. one colon polyp removed-no source for anemia identified. 2. moderate diverticulosis noted in the sigmoid colon and descending colon 3. the left colon is redundant 4. Rectal bleeding due ot small internal hemorroids 5. Moderate sized external hemorrhoids.   ESOPHAGOGASTRODUODENOSCOPY N/A 02/02/2015   SLF: 1. 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 large hiatal hernia 3. mild non-erosive gastritis 4. No source for anemia identified.    LEFT HEART CATH AND CORONARY ANGIOGRAPHY N/A 05/14/2020   Procedure: LEFT HEART CATH AND CORONARY ANGIOGRAPHY;  Surgeon: Lyn Records, MD;  Location: [facility];  Service: Cardiovascular;  Laterality: N/A;   LEFT HEART CATHETERIZATION WITH CORONARY ANGIOGRAM N/A 09/03/2013   Procedure: LEFT HEART CATHETERIZATION WITH CORONARY ANGIOGRAM;  Surgeon: Corky Crafts, MD;  Location: [facility] CATH [facility];  Service: Cardiovascular;  Laterality: N/A;   Right carpal tunnel release      FAMILY HISTORY: Family History   Problem Relation Age of Onset   Diabetes Mother    Hypertension Mother    Heart disease Mother    High Cholesterol Mother    Alzheimer's disease Father    Hypertension Sister    Hypertension Brother    Diabetes Brother    Arthritis Sister    Hypertension Sister    High Cholesterol Sister    Diabetes Son    Developmental delay Son    Colon cancer Neg Hx     SOCIAL HISTORY: Social History   Socioeconomic History   Marital status: Single    Spouse name: Not on file   Number of children: 2   Years of education: Not on file   Highest education level: Not on file  Occupational History   Occupation: Child psychotherapist    Employer: [employer]  Tobacco Use   Smoking status: Former    Current packs/day: 0.00    Types: Cigarettes    Quit date: 04/01/2012    Years since quitting: 10.9   Smokeless tobacco: Never  Vaping Use   Vaping status: Never Used  Substance and Sexual Activity   Alcohol use: Not Currently    Alcohol/week: 0.0 standard drinks of alcohol    Comment: very occasional   Drug use: No   Sexual activity: Not Currently    Birth control/protection: Post-menopausal  Other Topics Concern   Not on file  Social History Narrative   Not on file   Social Determinants of Health   Financial Resource Strain: Low Risk  (01/23/2023)   Overall Financial Resource Strain (CARDIA)    Difficulty of Paying Living Expenses: Not hard at all  Food Insecurity: No Food Insecurity (01/23/2023)   Hunger Vital Sign    Worried About Running Out of Food in the Last Year: Never true    Ran Out of Food in the Last Year: Never true  Transportation Needs: No Transportation Needs (01/23/2023)   PRAPARE - Administrator, Civil Service (Medical): No    Lack of Transportation (Non-Medical): No  Physical Activity: Insufficiently Active (01/23/2023)   Exercise Vital Sign    Days of Exercise per Week: 7 days    Minutes of Exercise per Session: 20 min  Stress: No Stress Concern  Present (01/23/2023)   [employer] of Occupational Health - Occupational Stress Questionnaire    Feeling of Stress : Not at all  Social Connections: Socially Integrated (01/23/2023)   Social Connection and Isolation Panel [NHANES]    Frequency of Communication with Friends and Family: More than three times  a week    Frequency of Social Gatherings with Friends and Family: More than three times a week    Attends Religious Services: More than 4 times per year    Active Member of [employer] or Organizations: Yes    Attends Engineer, structural: More than 4 times per year    Marital Status: Married  Catering manager Violence: Not At Risk (01/23/2023)   Humiliation, Afraid, Rape, and Kick questionnaire    Fear of Current or Ex-Partner: No    Emotionally Abused: No    Physically Abused: No    Sexually Abused: No    PHYSICAL EXAM  Vitals:   03/21/23 0849  BP: 124/87  Pulse: 92   There is no height or weight on file to calculate BMI.  Generalized: Well developed, in no acute distress  Neurological examination  Mentation: Alert oriented to time, place, history taking. Follows all commands speech and language fluent Cranial nerve II-XII: Pupils were equal round reactive to light. Extraocular movements were full, visual field were full on confrontational test. Facial sensation and strength were normal. Head turning and shoulder shrug  were normal and symmetric. Motor: The motor testing reveals 5 over 5 strength of all 4 extremities. Good symmetric motor tone is noted throughout.  Sensory: Sensory testing is intact to soft touch on all 4 extremities. No evidence of extinction is noted.  Coordination: Cerebellar testing reveals good finger-nose-finger and heel-to-shin bilaterally.  Gait and station: Gait is normal.   DIAGNOSTIC DATA (LABS, IMAGING, TESTING) - I reviewed patient records, labs, notes, testing and imaging myself where available.  Lab Results  Component Value Date    WBC 11.8 (H) 02/10/2023   HGB 12.3 02/10/2023   HCT 42.1 02/10/2023   MCV 74.0 (L) 02/10/2023   PLT 376 02/10/2023      Component Value Date/Time   NA 145 (H) 01/23/2023 0932   K 4.1 01/23/2023 0932   CL 107 (H) 01/23/2023 0932   CO2 23 01/23/2023 0932   GLUCOSE 108 (H) 01/23/2023 0932   GLUCOSE 137 (H) 11/02/2021 0926   BUN 17 01/23/2023 0932   CREATININE 0.89 01/23/2023 0932   CREATININE 0.84 12/18/2019 0822   CALCIUM 9.4 01/23/2023 0932   PROT 6.9 01/23/2023 0932   ALBUMIN 4.0 01/23/2023 0932   AST 15 01/23/2023 0932   ALT 10 01/23/2023 0932   ALKPHOS 93 01/23/2023 0932   BILITOT 0.2 01/23/2023 0932   GFRNONAA >60 11/02/2021 0926   GFRNONAA 83 10/23/2017 0811   GFRAA >60 10/15/2019 0850   GFRAA 97 10/23/2017 0811   Lab Results  Component Value Date   CHOL 181 01/23/2023   HDL 35 (L) 01/23/2023   LDLCALC 125 (H) 01/23/2023   TRIG 114 01/23/2023   CHOLHDL 5.2 (H) 01/23/2023   Lab Results  Component Value Date   HGBA1C 6.5 (H) 01/23/2023   Lab Results  Component Value Date   VITAMINB12 401 05/15/2020   Lab Results  Component Value Date   TSH 3.590 05/16/2022    Margie Ege, AGNP-C, DNP 03/21/2023, 9:27 AM [facility] [address], [address] [place], [place] [address] [phone]

## 2023-04-13 ENCOUNTER — Other Ambulatory Visit: Payer: Self-pay | Admitting: Internal Medicine

## 2023-05-11 ENCOUNTER — Other Ambulatory Visit: Payer: Self-pay | Admitting: Internal Medicine

## 2023-05-22 NOTE — Telephone Encounter (Signed)
Submitted auth renewal to [facility] via [facility], status is pending. Key: [id number]

## 2023-05-24 NOTE — Telephone Encounter (Signed)
Received approval, pt will continue to fill through [facility].  Auth#: [id number] (05/23/22-05/01/24)

## 2023-05-25 ENCOUNTER — Other Ambulatory Visit: Payer: Self-pay

## 2023-05-25 DIAGNOSIS — G43709 Chronic migraine without aura, not intractable, without status migrainosus: Secondary | ICD-10-CM

## 2023-05-25 MED ORDER — BOTOX 200 UNITS IJ SOLR: INTRAMUSCULAR | 2 refills | Status: DC

## 2023-05-26 ENCOUNTER — Ambulatory Visit (INDEPENDENT_AMBULATORY_CARE_PROVIDER_SITE_OTHER): Payer: Medicare HMO | Admitting: Internal Medicine

## 2023-05-26 ENCOUNTER — Encounter: Payer: Self-pay | Admitting: Internal Medicine

## 2023-05-26 VITALS — BP 124/86 | HR 97 | Ht 62.0 in | Wt 266.4 lb

## 2023-05-26 DIAGNOSIS — M5416 Radiculopathy, lumbar region: Secondary | ICD-10-CM | POA: Diagnosis not present

## 2023-05-26 DIAGNOSIS — Z0001 Encounter for general adult medical examination with abnormal findings: Secondary | ICD-10-CM

## 2023-05-26 DIAGNOSIS — E559 Vitamin D deficiency, unspecified: Secondary | ICD-10-CM

## 2023-05-26 DIAGNOSIS — I10 Essential (primary) hypertension: Secondary | ICD-10-CM

## 2023-05-26 DIAGNOSIS — Z7984 Long term (current) use of oral hypoglycemic drugs: Secondary | ICD-10-CM

## 2023-05-26 DIAGNOSIS — G43709 Chronic migraine without aura, not intractable, without status migrainosus: Secondary | ICD-10-CM | POA: Diagnosis not present

## 2023-05-26 DIAGNOSIS — E782 Mixed hyperlipidemia: Secondary | ICD-10-CM | POA: Diagnosis not present

## 2023-05-26 DIAGNOSIS — Z23 Encounter for immunization: Secondary | ICD-10-CM | POA: Diagnosis not present

## 2023-05-26 DIAGNOSIS — E1169 Type 2 diabetes mellitus with other specified complication: Secondary | ICD-10-CM | POA: Diagnosis not present

## 2023-05-26 DIAGNOSIS — I480 Paroxysmal atrial fibrillation: Secondary | ICD-10-CM

## 2023-05-26 MED ORDER — GABAPENTIN 100 MG PO CAPS: 100.0000 mg | ORAL_CAPSULE | Freq: Every day | ORAL | 3 refills | Status: DC

## 2023-05-26 NOTE — Assessment & Plan Note (Signed)
On Emgality and Topamax Has started Botox injections as well Ubrelvy PRN Follows up with Neurology

## 2023-05-26 NOTE — Assessment & Plan Note (Addendum)
Lab Results  Component Value Date   HGBA1C 6.5 (H) 01/23/2023   Associated with HTN, CHF and HLD Well-controlled On Metformin 500 mg BID and Jardiance 10 mg QD Considering her comorbidities, had started Ozempic, but did not tolerate - DC Ozempic On statin Has an Ophthalmologist

## 2023-05-26 NOTE — Assessment & Plan Note (Signed)
Currently in sinus rhythm On Toprol and Eliquis Follows up with Cardiologist

## 2023-05-26 NOTE — Assessment & Plan Note (Signed)
Last vitamin D Lab Results  Component Value Date   VD25OH 20.2 (L) 05/16/2022   Takes vitamin D supplement Check vitamin D level

## 2023-05-26 NOTE — Assessment & Plan Note (Signed)
On Atorvastatin

## 2023-05-26 NOTE — Assessment & Plan Note (Signed)
Chronic low back pain MRI of lumbar spine showed multilevel facet arthropathy Has been evaluated by orthopedic surgery and PM&R -had epidural injection with some relief in 2023 Weight and activities could be contributing Referred for PT Added gabapentin 100 mg nightly for neuropathic symptoms

## 2023-05-26 NOTE — Assessment & Plan Note (Signed)
Physical exam as documented. Counseling done  re healthy lifestyle involving commitment to 150 minutes exercise per week, heart healthy diet, and attaining healthy weight.The importance of adequate sleep also discussed. Immunization and cancer screening needs are specifically addressed at this visit.

## 2023-05-26 NOTE — Progress Notes (Signed)
Established Patient Office Visit  Subjective:  Patient ID: Monique Gomez, female    DOB: Jul 27, 1955  Age: 68 y.o. MRN: [id number]  CC:  Chief Complaint  Patient presents with   Annual Exam    Cpe today / 4 month f/u, reports neck pain worsening.     HPI Monique Gomez is a 68 y.o. female with past medical history of paroxysmal atrial fibrillation, hypertension, myocarditis, OSA on CPAP, GERD, DM, HLD and morbid obesity who presents for f/u of chronic medical conditions.  BP is well-controlled. Takes medications regularly. Patient denies  chest pain or palpitations.  She takes Entresto for history of hypertrophic cardiomegaly.  She is followed by cardiology and heart failure clinic.  She reports exertional dyspnea, but denies any orthopnea or PND.  Denies any leg swelling.  Denies any dizziness or palpitations currently.  She has chronic nasal congestion and postnasal drip.  She had has been using Zyrtec and Flonase for it.  Denies any fever or chills.  She has history of DM, for which she takes metformin. She has been also taking Jardiance for history of myocarditis/CHF.  She denies any fatigue, polyuria or polydipsia currently.  She uses CPAP for OSA.   She is on Emgality and Topamax for migraine.  She also gets Botox injections for it.  Followed by neurology.  She has chronic low back pain, for which she has seen orthopedic surgeon and PM&R.  She has had physical therapy without much relief, but agrees to try it again.  She has also had epidural injection in 11/23, but she has recurrence of low back pain for the last 5 months now.  Denies any recent injury or fall.  She has radiating symptoms to bilateral LE at times.  Denies any urinary or stool incontinence.      Past Medical History:  Diagnosis Date   Abdominal fibromatosis    Allergic rhinitis    Anemia    Asthma    Essential hypertension    GERD (gastroesophageal reflux disease)    History of cardiac  catheterization    No significant CAD May 2015   History of migraine headaches    Hyperlipidemia    Iron deficiency anemia 02/05/2021   Myocarditis (HCC)    a. diagnosed in 05/2020 with cath showing normal cors   Obesity    PAF (paroxysmal atrial fibrillation) (HCC) 08/2013   Sleep apnea    Type 2 diabetes mellitus [facility])     Past Surgical History:  Procedure Laterality Date   CATARACT EXTRACTION     CATARACT EXTRACTION W/PHACO Right 04/05/2019   Procedure: CATARACT EXTRACTION PHACO AND INTRAOCULAR LENS PLACEMENT RIGHT EYE (CDE: 3.19);  Surgeon: Fabio Pierce, MD;  Location: [facility];  Service: Ophthalmology;  Laterality: Right;   COLONOSCOPY N/A 02/02/2015   SLF: 1. one colon polyp removed-no source for anemia identified. 2. moderate diverticulosis noted in the sigmoid colon and descending colon 3. the left colon is redundant 4. Rectal bleeding due ot small internal hemorroids 5. Moderate sized external hemorrhoids.   ESOPHAGOGASTRODUODENOSCOPY N/A 02/02/2015   SLF: 1. 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 large hiatal hernia 3. mild non-erosive gastritis 4. No source for anemia identified.    LEFT HEART CATH AND CORONARY ANGIOGRAPHY N/A 05/14/2020   Procedure: LEFT HEART CATH AND CORONARY ANGIOGRAPHY;  Surgeon: Lyn Records, MD;  Location: [facility] INVASIVE [facility];  Service: Cardiovascular;  Laterality: N/A;   LEFT HEART CATHETERIZATION WITH CORONARY ANGIOGRAM N/A 09/03/2013  Procedure: LEFT HEART CATHETERIZATION WITH CORONARY ANGIOGRAM;  Surgeon: Corky Crafts, MD;  Location: [facility] CATH LAB;  Service: Cardiovascular;  Laterality: N/A;   Right carpal tunnel release      Family History  Problem Relation Age of Onset   Diabetes Mother    Hypertension Mother    Heart disease Mother    High Cholesterol Mother    Alzheimer's disease Father    Hypertension Sister    Hypertension Brother    Diabetes Brother    Arthritis Sister    Hypertension Sister    High Cholesterol  Sister    Diabetes Son    Developmental delay Son    Colon cancer Neg Hx     Social History   Socioeconomic History   Marital status: Single    Spouse name: Not on file   Number of children: 2   Years of education: Not on file   Highest education level: Not on file  Occupational History   Occupation: Child psychotherapist    Employer: [employer]  Tobacco Use   Smoking status: Former    Current packs/day: 0.00    Types: Cigarettes    Quit date: 04/01/2012    Years since quitting: 11.1   Smokeless tobacco: Never  Vaping Use   Vaping status: Never Used  Substance and Sexual Activity   Alcohol use: Not Currently    Alcohol/week: 0.0 standard drinks of alcohol    Comment: very occasional   Drug use: No   Sexual activity: Not Currently    Birth control/protection: Post-menopausal  Other Topics Concern   Not on file  Social History Narrative   Not on file   Social Drivers of Health   Financial Resource Strain: Low Risk  (01/23/2023)   Overall Financial Resource Strain (CARDIA)    Difficulty of Paying Living Expenses: Not hard at all  Food Insecurity: No Food Insecurity (01/23/2023)   Hunger Vital Sign    Worried About Running Out of Food in the Last Year: Never true    Ran Out of Food in the Last Year: Never true  Transportation Needs: No Transportation Needs (01/23/2023)   PRAPARE - Administrator, Civil Service (Medical): No    Lack of Transportation (Non-Medical): No  Physical Activity: Insufficiently Active (01/23/2023)   Exercise Vital Sign    Days of Exercise per Week: 7 days    Minutes of Exercise per Session: 20 min  Stress: No Stress Concern Present (01/23/2023)   [employer] of Occupational Health - Occupational Stress Questionnaire    Feeling of Stress : Not at all  Social Connections: Socially Integrated (01/23/2023)   Social Connection and Isolation Panel [NHANES]    Frequency of Communication with Friends and Family: More than three times a  week    Frequency of Social Gatherings with Friends and Family: More than three times a week    Attends Religious Services: More than 4 times per year    Active Member of [employer] or Organizations: Yes    Attends Engineer, structural: More than 4 times per year    Marital Status: Married  Catering manager Violence: Not At Risk (01/23/2023)   Humiliation, Afraid, Rape, and Kick questionnaire    Fear of Current or Ex-Partner: No    Emotionally Abused: No    Physically Abused: No    Sexually Abused: No    Outpatient Medications Prior to Visit  Medication Sig Dispense Refill   acetaminophen (TYLENOL) 500 MG  tablet Take 1 tablet (500 mg total) by mouth every 6 (six) hours as needed. 30 tablet 0   albuterol (VENTOLIN HFA) 108 (90 Base) MCG/ACT inhaler INHALE 2 PUFFS BY MOUTH EVERY 6 HOURS AS NEEDED FOR WHEEZING OR SHORTNESS OF BREATH 18 g 0   atorvastatin (LIPITOR) 80 MG tablet Take 1 tablet (80 mg total) by mouth daily. 90 tablet 3   blood glucose meter kit and supplies KIT Dispense based on patient and insurance preference. Use up to four times daily as directed. 1 each 0   botulinum toxin Type A (BOTOX) 200 units injection Provider to inject 155 units into the muscles of the head and neck every 3 months. Discard remainder 1 each 2   conjugated estrogens (PREMARIN) vaginal cream Apply 0.5 gm which is a pea-sized amount with the applicator or fingertip vaginally at bedtime for 2 weeks and then 2-3 x weekly. 42.5 g 12   cyclobenzaprine (FLEXERIL) 10 MG tablet One tablet every twelve hours as needed for spasm. 60 tablet 2   ELIQUIS 5 MG TABS tablet Take 1 tablet by mouth twice daily 180 tablet 0   EMGALITY 120 MG/ML SOAJ INJECT 120 MG SUBCUTANEOUSLY ONCE EVERY MONTH 1 mL 0   empagliflozin (JARDIANCE) 10 MG TABS tablet TAKE 1 TABLET BY MOUTH ONCE DAILY BEFORE BREAKFAST 90 tablet 3   esomeprazole (NEXIUM) 40 MG capsule Take 1 capsule (40 mg total) by mouth daily. 30 capsule 5   fluticasone  (FLONASE) 50 MCG/ACT nasal spray Place 2 sprays into both nostrils daily. 16 g 5   HYDROcodone-acetaminophen (NORCO/VICODIN) 5-325 MG tablet One tablet every four hours as needed for acute pain.  Limit of five days per [employer] statue. 30 tablet 0   ipratropium (ATROVENT) 0.03 % nasal spray Place 2 sprays into both nostrils every 12 (twelve) hours. 30 mL 3   Iron-FA-B Cmp-C-Biot-Probiotic (FUSION PLUS) CAPS Take 1 capsule by mouth daily. 30 capsule 11   meclizine (ANTIVERT) 25 MG tablet Take 1 tablet (25 mg total) by mouth 3 (three) times daily as needed for dizziness. 30 tablet 0   metFORMIN (GLUCOPHAGE) 500 MG tablet TAKE 1 TABLET BY MOUTH TWICE DAILY WITH A MEAL 180 tablet 0   metoCLOPramide (REGLAN) 10 MG tablet Take 1 tablet (10 mg total) by mouth every 8 (eight) hours as needed for up to 7 days for nausea (Headache). 21 tablet 0   metoprolol succinate (TOPROL-XL) 50 MG 24 hr tablet TAKE 1 TABLET BY MOUTH IN THE MORNING AND 1 AT BEDTIME. TAKE WITH OR IMMEDIATELY FOLLOWING A MEAL. 180 tablet 3   ondansetron (ZOFRAN) 4 MG tablet Take 1 tablet (4 mg total) by mouth every 8 (eight) hours as needed for nausea or vomiting. 20 tablet 0   sacubitril-valsartan (ENTRESTO) 97-103 MG Take 1 tablet by mouth twice daily 180 tablet 2   topiramate (TOPAMAX) 50 MG tablet Take 2 tablets (100 mg total) by mouth at bedtime. 180 tablet 3   Ubrogepant (UBRELVY) 50 MG TABS TAKE ONE TABLET BY MOUTH AT THE ONSET OF A MIGRAINE. CAN REPEAT IN 2 HOURS IF NEEDED 10 tablet 11   No facility-administered medications prior to visit.    Allergies  Allergen Reactions   Venlafaxine Nausea And Vomiting and Other (See Comments)    Dizziness, shakiness    ROS Review of Systems  Constitutional:  Negative for chills and fever.  HENT:  Positive for congestion, postnasal drip and sinus pressure. Negative for sore throat.   Eyes:  Negative for pain and discharge.  Respiratory:  Positive for shortness of breath. Negative for  cough.   Cardiovascular:  Negative for chest pain and palpitations.  Gastrointestinal:  Negative for abdominal pain, diarrhea, nausea and vomiting.  Endocrine: Negative for polydipsia and polyuria.  Genitourinary:  Negative for dysuria and hematuria.  Musculoskeletal:  Positive for back pain. Negative for neck pain and neck stiffness.  Skin:  Negative for rash.  Neurological:  Positive for headaches. Negative for weakness.  Psychiatric/Behavioral:  Negative for agitation and behavioral problems.       Objective:    Physical Exam Vitals reviewed.  Constitutional:      General: She is not in acute distress.    Appearance: She is obese. She is not diaphoretic.  HENT:     Head: Normocephalic and atraumatic.     Nose: Congestion present.     Mouth/Throat:     Mouth: Mucous membranes are moist.  Eyes:     General: No scleral icterus.    Extraocular Movements: Extraocular movements intact.  Cardiovascular:     Rate and Rhythm: Normal rate and regular rhythm.     Heart sounds: Normal heart sounds. No murmur heard. Pulmonary:     Breath sounds: Normal breath sounds. No wheezing or rales.  Abdominal:     Palpations: Abdomen is soft.     Tenderness: There is no abdominal tenderness.  Musculoskeletal:     Cervical back: Neck supple. No tenderness.     Right lower leg: No edema.     Left lower leg: No edema.  Skin:    General: Skin is warm.     Findings: Erythema (Chronic, over right side of face) present. No rash.  Neurological:     General: No focal deficit present.     Mental Status: She is alert and oriented to person, place, and time.     Cranial Nerves: No cranial nerve deficit.     Sensory: No sensory deficit.     Motor: No weakness.  Psychiatric:        Mood and Affect: Mood normal.        Behavior: Behavior normal.     BP 124/86   Pulse 97   Ht 5\' 2"  (1.575 m)   Wt 266 lb 6.4 oz (120.8 kg)   SpO2 93%   BMI 48.73 kg/m  Wt Readings from Last 3 Encounters:   05/26/23 266 lb 6.4 oz (120.8 kg)  02/16/23 265 lb 6.4 oz (120.4 kg)  01/23/23 264 lb (119.7 kg)    Lab Results  Component Value Date   TSH 3.590 05/16/2022   Lab Results  Component Value Date   WBC 11.8 (H) 02/10/2023   HGB 12.3 02/10/2023   HCT 42.1 02/10/2023   MCV 74.0 (L) 02/10/2023   PLT 376 02/10/2023   Lab Results  Component Value Date   NA 145 (H) 01/23/2023   K 4.1 01/23/2023   CO2 23 01/23/2023   GLUCOSE 108 (H) 01/23/2023   BUN 17 01/23/2023   CREATININE 0.89 01/23/2023   BILITOT 0.2 01/23/2023   ALKPHOS 93 01/23/2023   AST 15 01/23/2023   ALT 10 01/23/2023   PROT 6.9 01/23/2023   ALBUMIN 4.0 01/23/2023   CALCIUM 9.4 01/23/2023   ANIONGAP 8 11/02/2021   EGFR 71 01/23/2023   Lab Results  Component Value Date   CHOL 181 01/23/2023   Lab Results  Component Value Date   HDL 35 (L) 01/23/2023   Lab Results  Component Value Date   LDLCALC 125 (H) 01/23/2023   Lab Results  Component Value Date   TRIG 114 01/23/2023   Lab Results  Component Value Date   CHOLHDL 5.2 (H) 01/23/2023   Lab Results  Component Value Date   HGBA1C 6.5 (H) 01/23/2023      Assessment & Plan:   Problem List Items Addressed This Visit       Cardiovascular and Mediastinum   Migraine (Chronic)   On Emgality and Topamax Has started Botox injections as well Ubrelvy PRN Follows up with Neurology      Relevant Medications   gabapentin (NEURONTIN) 100 MG capsule   HTN (hypertension) (Chronic)   BP Readings from Last 1 Encounters:  05/26/23 124/86   Well-controlled with Entresto and Metoprolol Counseled for compliance with the medications Advised DASH diet and moderate exercise/walking, at least 150 mins/week      Relevant Orders   TSH   CMP14+EGFR   CBC with Differential/Platelet   PAF (paroxysmal atrial fibrillation) (HCC) (Chronic)   Currently in sinus rhythm On Toprol and Eliquis Follows up with Cardiologist      Relevant Orders   TSH      Endocrine   DM (diabetes mellitus) (HCC) (Chronic)   Lab Results  Component Value Date   HGBA1C 6.5 (H) 01/23/2023   Associated with HTN, CHF and HLD Well-controlled On Metformin 500 mg BID and Jardiance 10 mg QD Considering her comorbidities, had started Ozempic, but did not tolerate - DC Ozempic On statin Has an Ophthalmologist      Relevant Orders   Microalbumin / creatinine urine ratio   Hemoglobin A1c   CMP14+EGFR     Nervous and Auditory   Lumbar radiculopathy   Chronic low back pain MRI of lumbar spine showed multilevel facet arthropathy Has been evaluated by orthopedic surgery and PM&R -had epidural injection with some relief in 2023 Weight and activities could be contributing Referred for PT Added gabapentin 100 mg nightly for neuropathic symptoms      Relevant Medications   gabapentin (NEURONTIN) 100 MG capsule   Other Relevant Orders   Ambulatory referral to Physical Therapy     Other   Hyperlipidemia (Chronic)   On Atorvastatin      Relevant Orders   Lipid panel   Morbid obesity (HCC)   BMI Readings from Last 3 Encounters:  05/26/23 48.73 kg/m  02/16/23 48.54 kg/m  01/23/23 48.29 kg/m   Diet modification advised Mobility limited due to exertional dyspnea      Vitamin D deficiency   Last vitamin D Lab Results  Component Value Date   VD25OH 20.2 (L) 05/16/2022   Takes vitamin D supplement Check vitamin D level      Relevant Orders   VITAMIN D 25 Hydroxy (Vit-D Deficiency, Fractures)   Encounter for general adult medical examination with abnormal findings - Primary   Physical exam as documented. Counseling done  re healthy lifestyle involving commitment to 150 minutes exercise per week, heart healthy diet, and attaining healthy weight.The importance of adequate sleep also discussed. Immunization and cancer screening needs are specifically addressed at this visit.       Other Visit Diagnoses       Encounter for immunization        Relevant Orders   Flu Vaccine Trivalent High Dose (Fluad) (Completed)        Meds ordered this encounter  Medications   gabapentin (NEURONTIN) 100 MG capsule    Sig: Take 1 capsule (  100 mg total) by mouth at bedtime.    Dispense:  30 capsule    Refill:  3    Follow-up: Return in about 4 months (around 09/23/2023) for HTN and DM.    Anabel Halon, MD

## 2023-05-26 NOTE — Assessment & Plan Note (Signed)
BMI Readings from Last 3 Encounters:  05/26/23 48.73 kg/m  02/16/23 48.54 kg/m  01/23/23 48.29 kg/m   Diet modification advised Mobility limited due to exertional dyspnea

## 2023-05-26 NOTE — Assessment & Plan Note (Signed)
BP Readings from Last 1 Encounters:  05/26/23 124/86   Well-controlled with Entresto and Metoprolol Counseled for compliance with the medications Advised DASH diet and moderate exercise/walking, at least 150 mins/week

## 2023-05-26 NOTE — Patient Instructions (Addendum)
Please schedule Mammogram.  Please schedule Diabetic Eye Exam.  Please start taking Gabapentin 100 mg at bedtime.  Please continue to take medications as prescribed.  Please continue to follow low carb diet and perform moderate exercise/walking at least 150 mins/week.

## 2023-05-28 ENCOUNTER — Other Ambulatory Visit: Payer: Self-pay | Admitting: Neurology

## 2023-05-28 DIAGNOSIS — G43709 Chronic migraine without aura, not intractable, without status migrainosus: Secondary | ICD-10-CM

## 2023-05-28 LAB — CBC WITH DIFFERENTIAL/PLATELET
Basophils Absolute: 0 10*3/uL (ref 0.0–0.2)
Basos: 0 %
EOS (ABSOLUTE): 0.2 10*3/uL (ref 0.0–0.4)
Eos: 2 %
Hematocrit: 44.8 % (ref 34.0–46.6)
Hemoglobin: 13 g/dL (ref 11.1–15.9)
Immature Grans (Abs): 0 10*3/uL (ref 0.0–0.1)
Immature Granulocytes: 0 %
Lymphocytes Absolute: 2.3 10*3/uL (ref 0.7–3.1)
Lymphs: 23 %
MCH: 21.4 pg — ABNORMAL LOW (ref 26.6–33.0)
MCHC: 29 g/dL — ABNORMAL LOW (ref 31.5–35.7)
MCV: 74 fL — ABNORMAL LOW (ref 79–97)
Monocytes Absolute: 0.7 10*3/uL (ref 0.1–0.9)
Monocytes: 7 %
Neutrophils Absolute: 6.6 10*3/uL (ref 1.4–7.0)
Neutrophils: 68 %
Platelets: 380 10*3/uL (ref 150–450)
RBC: 6.08 x10E6/uL — ABNORMAL HIGH (ref 3.77–5.28)
RDW: 15.6 % — ABNORMAL HIGH (ref 11.7–15.4)
WBC: 9.9 10*3/uL (ref 3.4–10.8)

## 2023-05-28 LAB — CMP14+EGFR
ALT: 14 [IU]/L (ref 0–32)
AST: 17 [IU]/L (ref 0–40)
Albumin: 4 g/dL (ref 3.9–4.9)
Alkaline Phosphatase: 104 [IU]/L (ref 44–121)
BUN/Creatinine Ratio: 17 (ref 12–28)
BUN: 13 mg/dL (ref 8–27)
Bilirubin Total: <0.2 mg/dL (ref 0.0–1.2)
CO2: 25 mmol/L (ref 20–29)
Calcium: 9.7 mg/dL (ref 8.7–10.3)
Chloride: 103 mmol/L (ref 96–106)
Creatinine, Ser: 0.78 mg/dL (ref 0.57–1.00)
Globulin, Total: 3 g/dL (ref 1.5–4.5)
Glucose: 105 mg/dL — ABNORMAL HIGH (ref 70–99)
Potassium: 4.5 mmol/L (ref 3.5–5.2)
Sodium: 143 mmol/L (ref 134–144)
Total Protein: 7 g/dL (ref 6.0–8.5)
eGFR: 84 mL/min/{1.73_m2} (ref >59)

## 2023-05-28 LAB — HEMOGLOBIN A1C
Est. average glucose Bld gHb Est-mCnc: 140 mg/dL
Hgb A1c MFr Bld: 6.5 % — ABNORMAL HIGH (ref 4.8–5.6)

## 2023-05-28 LAB — LIPID PANEL
Chol/HDL Ratio: 4.2 {ratio} (ref 0.0–4.4)
Cholesterol, Total: 165 mg/dL (ref 100–199)
HDL: 39 mg/dL — ABNORMAL LOW (ref >39)
LDL Chol Calc (NIH): 109 mg/dL — ABNORMAL HIGH (ref 0–99)
Triglycerides: 88 mg/dL (ref 0–149)
VLDL Cholesterol Cal: 17 mg/dL (ref 5–40)

## 2023-05-28 LAB — MICROALBUMIN / CREATININE URINE RATIO
Creatinine, Urine: 206.3 mg/dL
Microalb/Creat Ratio: 8 mg/g{creat} (ref 0–29)
Microalbumin, Urine: 16.3 ug/mL

## 2023-05-28 LAB — VITAMIN D 25 HYDROXY (VIT D DEFICIENCY, FRACTURES): Vit D, 25-Hydroxy: 82 ng/mL (ref 30.0–100.0)

## 2023-05-28 LAB — TSH: TSH: 2.48 u[IU]/mL (ref 0.450–4.500)

## 2023-06-03 ENCOUNTER — Encounter (HOSPITAL_COMMUNITY): Payer: Self-pay | Admitting: Hematology

## 2023-06-05 ENCOUNTER — Encounter (HOSPITAL_COMMUNITY): Payer: Self-pay | Admitting: Hematology

## 2023-06-05 ENCOUNTER — Ambulatory Visit: Payer: Medicare Other

## 2023-06-05 NOTE — Telephone Encounter (Signed)
Submitted benefit verification under [employer]. [id number]

## 2023-06-05 NOTE — Telephone Encounter (Signed)
[employer] auth for buy/bill was approved. Will update once benefit verification is complete to see if this is a cheaper option for pt.  Auth#: [id number] (06/05/23-06/04/24)

## 2023-06-05 NOTE — Telephone Encounter (Signed)
Called [facility] to set up Botox delivery, rep states pt declined setting up delivery due to high copay of $747.97. Since she has Medicare, pt is not eligible for copay card. Sent MyChart msg seeing what she would like to do regarding appt.

## 2023-06-07 ENCOUNTER — Encounter (HOSPITAL_COMMUNITY): Payer: Self-pay | Admitting: Hematology

## 2023-06-07 ENCOUNTER — Ambulatory Visit (HOSPITAL_COMMUNITY): Payer: Medicare HMO

## 2023-06-09 ENCOUNTER — Encounter (HOSPITAL_COMMUNITY): Payer: Self-pay | Admitting: Hematology

## 2023-06-14 ENCOUNTER — Ambulatory Visit: Payer: Medicare Other | Admitting: Neurology

## 2023-06-14 DIAGNOSIS — G43709 Chronic migraine without aura, not intractable, without status migrainosus: Secondary | ICD-10-CM | POA: Diagnosis not present

## 2023-06-14 MED ORDER — EMGALITY 120 MG/ML ~~LOC~~ SOAJ: 1.0000 | SUBCUTANEOUS | Status: DC

## 2023-06-14 MED ORDER — ONABOTULINUMTOXINA 100 UNITS IJ SOLR
155.0000 [IU] | Freq: Once | INTRAMUSCULAR | Status: AC
  Administered 2023-06-14: 155 [IU] via INTRAMUSCULAR

## 2023-06-14 NOTE — Addendum Note (Signed)
Addended by: Berna Spare A on: 06/14/2023 01:13 PM   Modules accepted: Orders

## 2023-06-14 NOTE — Progress Notes (Signed)
Botox- 100 units x 2 vial Lot: [id number] Expiration: 08/2025 NDC: 5409-8119-14  Bacteriostatic 0.9% Sodium Chloride- 4 mL  Lot: [id number] Expiration: 03/02/2024 NDC: 2130-8657-84  Dx: O96.295 B/B Witnessed by April J RN

## 2023-06-14 NOTE — Progress Notes (Signed)
   BOTOX PROCEDURE NOTE FOR MIGRAINE HEADACHE   HISTORY: Monique Gomez is here for Botox.  Last was 03/21/2023 with me.  Unfortunately, her insurance changed, still pending her application for medication assistance with Social Security.  Has not been able to have Emgality since December.  Over the last week has had daily migraines as Botox has worn off.  Restarted Topamax 100 mg at bedtime.  A lot of financial stress, all of her expenses have increased.   Description of procedure:  The patient was placed in a sitting position. The standard protocol was used for Botox as follows, with 5 units of Botox injected at each site:   -Procerus muscle, midline injection  -Corrugator muscle, bilateral injection  -Frontalis muscle, bilateral injection, with 2 sites each side, medial injection was performed in the upper one third of the frontalis muscle, in the region vertical from the medial inferior edge of the superior orbital rim. The lateral injection was again in the upper one third of the forehead vertically above the lateral limbus of the cornea, 1.5 cm lateral to the medial injection site.  -Temporalis muscle injection, 4 sites, bilaterally. The first injection was 3 cm above the tragus of the ear, second injection site was 1.5 cm to 3 cm up from the first injection site in line with the tragus of the ear. The third injection site was 1.5-3 cm forward between the first 2 injection sites. The fourth injection site was 1.5 cm posterior to the second injection site.  -Occipitalis muscle injection, 3 sites, bilaterally. The first injection was done one half way between the occipital protuberance and the tip of the mastoid process behind the ear. The second injection site was done lateral and superior to the first, 1 fingerbreadth from the first injection. The third injection site was 1 fingerbreadth superiorly and medially from the first injection site.  -Cervical paraspinal muscle injection, 2  sites, bilateral, the first injection site was 1 cm from the midline of the cervical spine, 3 cm inferior to the lower border of the occipital protuberance. The second injection site was 1.5 cm superiorly and laterally to the first injection site.  -Trapezius muscle injection was performed at 3 sites, bilaterally. The first injection site was in the upper trapezius muscle halfway between the inflection point of the neck, and the acromion. The second injection site was one half way between the acromion and the first injection site. The third injection was done between the first injection site and the inflection point of the neck.   A 200 unit bottle of Botox was used, 155 units were injected, the rest of the Botox was wasted. The patient tolerated the procedure well, there were no complications of the above procedure.  Botox NDC [phone] Lot number [id number] Expiration date 08/2025 BB  We gave her a sample of Emgality while she awaits social security to approve her assistance with medication.

## 2023-06-29 ENCOUNTER — Encounter: Payer: Self-pay | Admitting: Neurology

## 2023-07-13 ENCOUNTER — Telehealth: Payer: Self-pay | Admitting: Pharmacist

## 2023-07-13 ENCOUNTER — Encounter: Payer: Self-pay | Admitting: Internal Medicine

## 2023-07-13 ENCOUNTER — Other Ambulatory Visit: Payer: Self-pay

## 2023-07-13 ENCOUNTER — Encounter: Payer: Self-pay | Admitting: Cardiology

## 2023-07-13 ENCOUNTER — Other Ambulatory Visit: Payer: Self-pay | Admitting: Internal Medicine

## 2023-07-13 DIAGNOSIS — I480 Paroxysmal atrial fibrillation: Secondary | ICD-10-CM

## 2023-07-13 DIAGNOSIS — E782 Mixed hyperlipidemia: Secondary | ICD-10-CM

## 2023-07-13 DIAGNOSIS — K219 Gastro-esophageal reflux disease without esophagitis: Secondary | ICD-10-CM

## 2023-07-13 MED ORDER — APIXABAN 5 MG PO TABS: 5.0000 mg | ORAL_TABLET | Freq: Two times a day (BID) | ORAL | 0 refills | Status: DC

## 2023-07-13 MED ORDER — UBRELVY 50 MG PO TABS: ORAL_TABLET | ORAL | 11 refills | Status: DC

## 2023-07-13 MED ORDER — ESOMEPRAZOLE MAGNESIUM 40 MG PO CPDR: 40.0000 mg | DELAYED_RELEASE_CAPSULE | Freq: Every day | ORAL | 0 refills | Status: DC

## 2023-07-13 MED ORDER — EMPAGLIFLOZIN 10 MG PO TABS: 10.0000 mg | ORAL_TABLET | Freq: Every day | ORAL | 1 refills | Status: DC

## 2023-07-13 MED ORDER — ENTRESTO 97-103 MG PO TABS: 1.0000 | ORAL_TABLET | Freq: Two times a day (BID) | ORAL | 1 refills | Status: DC

## 2023-07-13 MED ORDER — METOPROLOL SUCCINATE ER 50 MG PO TB24: 50.0000 mg | ORAL_TABLET | Freq: Every day | ORAL | 1 refills | Status: DC

## 2023-07-13 MED ORDER — TOPIRAMATE 50 MG PO TABS: 100.0000 mg | ORAL_TABLET | Freq: Every day | ORAL | 3 refills | Status: DC

## 2023-07-13 MED ORDER — ATORVASTATIN CALCIUM 80 MG PO TABS: 80.0000 mg | ORAL_TABLET | Freq: Every day | ORAL | 3 refills | Status: DC

## 2023-07-13 MED ORDER — EMGALITY 120 MG/ML ~~LOC~~ SOAJ: 1.0000 | SUBCUTANEOUS | Status: DC

## 2023-07-13 MED ORDER — METFORMIN HCL 500 MG PO TABS: 500.0000 mg | ORAL_TABLET | Freq: Two times a day (BID) | ORAL | 0 refills | Status: DC

## 2023-07-13 NOTE — Telephone Encounter (Signed)
 Refills sent to pharmacy.

## 2023-07-13 NOTE — Addendum Note (Signed)
 Addended by: Eather Colas E on: 07/13/2023 02:05 PM   Modules accepted: Orders

## 2023-07-13 NOTE — Telephone Encounter (Signed)
-----   Message from [facility] [place] B sent at 07/13/2023  4:58 PM EDT ----- Pt needs refill on Eliquis. Pt has appt on 4/2 with B. Strader. Pt stated she was out of medication. Please send to [facility].

## 2023-07-15 ENCOUNTER — Other Ambulatory Visit: Payer: Self-pay | Admitting: Cardiology

## 2023-07-26 ENCOUNTER — Encounter: Payer: Self-pay | Admitting: Neurology

## 2023-07-26 ENCOUNTER — Other Ambulatory Visit: Payer: Self-pay

## 2023-07-26 MED ORDER — APIXABAN 5 MG PO TABS: 5.0000 mg | ORAL_TABLET | Freq: Two times a day (BID) | ORAL | 0 refills | Status: DC

## 2023-07-26 MED ORDER — EMGALITY 120 MG/ML ~~LOC~~ SOAJ: SUBCUTANEOUS | 11 refills | Status: DC

## 2023-07-27 ENCOUNTER — Telehealth: Payer: Self-pay | Admitting: Pharmacy Technician

## 2023-07-27 ENCOUNTER — Encounter (HOSPITAL_COMMUNITY): Payer: Self-pay | Admitting: Hematology

## 2023-07-27 ENCOUNTER — Other Ambulatory Visit (HOSPITAL_COMMUNITY): Payer: Self-pay

## 2023-07-27 NOTE — Telephone Encounter (Signed)
 PAP: Patient assistance application for Eliquis through [employer] ([employer]) has been mailed to pt's home address on file. Provider portion of application will be faxed to provider's office.

## 2023-08-02 ENCOUNTER — Encounter: Payer: Self-pay | Admitting: Student

## 2023-08-02 ENCOUNTER — Ambulatory Visit: Attending: Student | Admitting: Student

## 2023-08-02 VITALS — BP 122/84 | HR 90 | Ht 62.0 in | Wt 260.0 lb

## 2023-08-02 DIAGNOSIS — I5032 Chronic diastolic (congestive) heart failure: Secondary | ICD-10-CM

## 2023-08-02 DIAGNOSIS — I480 Paroxysmal atrial fibrillation: Secondary | ICD-10-CM | POA: Diagnosis not present

## 2023-08-02 DIAGNOSIS — E785 Hyperlipidemia, unspecified: Secondary | ICD-10-CM | POA: Diagnosis not present

## 2023-08-02 DIAGNOSIS — I10 Essential (primary) hypertension: Secondary | ICD-10-CM | POA: Diagnosis not present

## 2023-08-02 DIAGNOSIS — Z8679 Personal history of other diseases of the circulatory system: Secondary | ICD-10-CM

## 2023-08-02 MED ORDER — EMPAGLIFLOZIN 10 MG PO TABS: 10.0000 mg | ORAL_TABLET | Freq: Every day | ORAL | 0 refills | Status: DC

## 2023-08-02 MED ORDER — ENTRESTO 49-51 MG PO TABS: 1.0000 | ORAL_TABLET | Freq: Two times a day (BID) | ORAL | 0 refills | Status: DC

## 2023-08-02 NOTE — Patient Instructions (Addendum)
 Medication Instructions:   Samples given today for Jardiance & Entresto  Continue all current medications.   Labwork:  none  Testing/Procedures:  none  Follow-Up:  6 months   Any Other Special Instructions Will Be Listed Below (If Applicable).   If you need a refill on your cardiac medications before your next appointment, please call your pharmacy.

## 2023-08-02 NOTE — Progress Notes (Signed)
 Cardiology Office Note    Date:  08/02/2023  ID:  Monique Gomez, Monique Gomez [birth year], MRN [id number] Cardiologist: Nona Dell, MD    History of Present Illness:    Monique Gomez is a 68 y.o. female with past medical history of acute viral myocarditis (occurring in 05/2020 and cardiac catheterization showing normal cors), paroxysmal atrial fibrillation, HTN, HLD, Type II DM OSA and GERD who presents to the office today for overdue follow-up.  She was last examined by Dr. Diona Browner in 08/2022 and reported NYHA class II dyspnea but no recent chest pain or palpitations. A follow-up echocardiogram was recommended for further assessment and she was continued on Eliquis 5 mg twice daily, Atorvastatin 80 mg daily, Jardiance 10 mg daily, Toprol-XL 50 mg twice daily and Entresto 97-103 mg twice daily. Repeat echocardiogram in 10/2022 showed her EF was 60 to 65% and she did have severe asymmetric LVH of the septal and basal segments and moderate LVH elsewhere. She was noted to have grade 1 diastolic dysfunction, normal RV function, trivial MR and borderline dilatation of the ascending aorta at 39 mm.  In talking with the patient today, she reports overall doing well from a cardiac perspective since her last office visit.  Denies any specific dyspnea on exertion, chest pain or palpitations. No recent orthopnea, PND or pitting edema. Her biggest issue over the past several months has been worsening back pain and she is scheduled to see orthopedics soon. Reports having undergone an epidural injection with no improvement in symptoms. She does report changes in her insurance coverage and she has been applying for patient assistance for Eliquis, Entresto and Jardiance.  Requests samples of Jardiance and Sherryll Burger today as she is almost out of these.  Studies Reviewed:   EKG: EKG is ordered today and demonstrates:   EKG Interpretation Date/Time:  Wednesday August 02 2023 13:27:17 EDT Ventricular Rate:  94 PR  Interval:  154 QRS Duration:  140 QT Interval:  384 QTC Calculation: 480 R Axis:   -23  Text Interpretation: Normal sinus rhythm with sinus arrhythmia Right bundle branch block When compared with ECG of 21-Oct-2020 10:34, No significant change was found Confirmed by Randall An ([id number]) on 08/02/2023 1:37:41 PM       Cardiac Catheterization: 05/2020 Widely patent/normal widely patent coronary arteries. Right dominant anatomy Regional wall motion abnormality noted on left ventriculography. EF 40 to 50%.   RECOMMENDATIONS:   Given context of clinical presentation, myopericarditis is now the leading diagnosis in the differential. This presentation is not atypical stress cardiomyopathy. Recommend MRI/cardiac.   Echocardiogram: 10/2022 IMPRESSIONS     1. Left ventricular ejection fraction, by estimation, is 60 to 65%. The  left ventricle has normal function. The left ventricle has no regional  wall motion abnormalities. There is severe asymmetric left ventricular  hypertrophy of the septal and basal  segments and remainder moderate left ventricular hypertrophy. Left  ventricular diastolic parameters are consistent with Grade I diastolic  dysfunction (impaired relaxation).   2. Right ventricular systolic function is normal. The right ventricular  size is normal.   3. The mitral valve is normal in structure. Trivial mitral valve  regurgitation. No evidence of mitral stenosis.   4. The aortic valve was not well visualized. Aortic valve regurgitation  is not visualized. No aortic stenosis is present.   5. Aortic dilatation noted. There is borderline dilatation of the  ascending aorta, measuring 39 mm.   Comparison(s): No significant change from prior study.  Risk Assessment/Calculations:    CHA2DS2-VASc Score = 5   This indicates a 7.2% annual risk of stroke. The patient's score is based upon: CHF History: 1 HTN History: 1 Diabetes History: 1 Stroke History:  0 Vascular Disease History: 0 Age Score: 1 Gender Score: 1   Physical Exam:   VS:  BP 122/84   Pulse 90   Ht 5\' 2"  (1.575 m)   Wt 260 lb (117.9 kg)   SpO2 99%   BMI 47.55 kg/m    Wt Readings from Last 3 Encounters:  08/02/23 260 lb (117.9 kg)  05/26/23 266 lb 6.4 oz (120.8 kg)  02/16/23 265 lb 6.4 oz (120.4 kg)     GEN: Well nourished, well developed female appearing in no acute distress NECK: No JVD; No carotid bruits CARDIAC: RRR, no murmurs, rubs, gallops RESPIRATORY:  Clear to auscultation without rales, wheezing or rhonchi  ABDOMEN: Appears non-distended. No obvious abdominal masses. EXTREMITIES: No clubbing or cyanosis. No pitting edema.  Distal pedal pulses are 2+ bilaterally.   Assessment and Plan:   1. Chronic HFpEF/History of Viral Myocarditis - She previously had viral myocarditis in 05/2020 and cardiac catheterization showed normal coronary arteries.  Recent echocardiogram in 10/2022 showed a preserved EF with severe asymmetric LVH as noted on prior cMRI.  - She denies any recent respiratory issues and appears euvolemic by examination today. Continue current medical therapy with Jardiance 10 mg daily, Entresto 97-103 mg twice daily and Toprol-XL 50 mg twice daily.  2. Paroxysmal Atrial Fibrillation - She denies any recent palpitations and is in normal sinus rhythm by examination and EKG today. Continue Toprol-XL 50 mg twice daily for rate-control. - Remains on Eliquis 5 mg twice daily for anticoagulation which is the correct dose given her age, weight and renal function. CBC in 05/2023 showed her hemoglobin was stable at 13.0 with platelets at 380 K.  3. HTN - BP is at 122/84 during today's visit and she reports it has actually been under better control when checked at home with SBP typically in the 110's. Continue current medical therapy with Entresto 97-103 mg twice daily and Toprol-XL 50 mg twice daily.  4. HLD - FLP in 05/2023 showed total cholesterol 165,  triglycerides 88 and LDL 109. Remains on Atorvastatin 80 mg daily.  She does report making dietary changes in the interim and has reduced her intake of red meat.  Signed, Ellsworth Lennox, PA-C

## 2023-08-10 NOTE — Telephone Encounter (Signed)
 Tried to call patient to check if she has received the application. Mailbox is full

## 2023-08-12 ENCOUNTER — Other Ambulatory Visit: Payer: Self-pay | Admitting: Internal Medicine

## 2023-08-12 DIAGNOSIS — K219 Gastro-esophageal reflux disease without esophagitis: Secondary | ICD-10-CM

## 2023-08-14 NOTE — Telephone Encounter (Signed)
 Per JY:NWGN morning,  I just received it Saturday, I completed it and just need to make copies of documents required, I will turn everything in to the [facility] office this week. Thank you.

## 2023-08-14 NOTE — Telephone Encounter (Signed)
 Sent the patient a mychart message for a follow up since we have not heard back about their application

## 2023-08-15 ENCOUNTER — Other Ambulatory Visit: Payer: Self-pay | Admitting: Internal Medicine

## 2023-08-15 DIAGNOSIS — K219 Gastro-esophageal reflux disease without esophagitis: Secondary | ICD-10-CM

## 2023-08-16 ENCOUNTER — Other Ambulatory Visit: Payer: Self-pay

## 2023-08-16 DIAGNOSIS — R718 Other abnormality of red blood cells: Secondary | ICD-10-CM

## 2023-08-16 DIAGNOSIS — D509 Iron deficiency anemia, unspecified: Secondary | ICD-10-CM

## 2023-08-16 DIAGNOSIS — R590 Localized enlarged lymph nodes: Secondary | ICD-10-CM

## 2023-08-16 DIAGNOSIS — D508 Other iron deficiency anemias: Secondary | ICD-10-CM

## 2023-08-17 ENCOUNTER — Inpatient Hospital Stay: Payer: Medicare Other

## 2023-08-18 ENCOUNTER — Inpatient Hospital Stay: Attending: Hematology

## 2023-08-18 DIAGNOSIS — R591 Generalized enlarged lymph nodes: Secondary | ICD-10-CM | POA: Insufficient documentation

## 2023-08-18 DIAGNOSIS — D509 Iron deficiency anemia, unspecified: Secondary | ICD-10-CM | POA: Insufficient documentation

## 2023-08-18 DIAGNOSIS — R718 Other abnormality of red blood cells: Secondary | ICD-10-CM

## 2023-08-18 DIAGNOSIS — Z87891 Personal history of nicotine dependence: Secondary | ICD-10-CM | POA: Insufficient documentation

## 2023-08-18 DIAGNOSIS — D508 Other iron deficiency anemias: Secondary | ICD-10-CM

## 2023-08-18 DIAGNOSIS — R590 Localized enlarged lymph nodes: Secondary | ICD-10-CM

## 2023-08-18 LAB — CBC WITH DIFFERENTIAL/PLATELET
Abs Immature Granulocytes: 0.03 10*3/uL (ref 0.00–0.07)
Basophils Absolute: 0 10*3/uL (ref 0.0–0.1)
Basophils Relative: 0 %
Eosinophils Absolute: 0.2 10*3/uL (ref 0.0–0.5)
Eosinophils Relative: 2 %
HCT: 42.3 % (ref 36.0–46.0)
Hemoglobin: 12.6 g/dL (ref 12.0–15.0)
Immature Granulocytes: 0 %
Lymphocytes Relative: 27 %
Lymphs Abs: 2.3 10*3/uL (ref 0.7–4.0)
MCH: 21.5 pg — ABNORMAL LOW (ref 26.0–34.0)
MCHC: 29.8 g/dL — ABNORMAL LOW (ref 30.0–36.0)
MCV: 72.3 fL — ABNORMAL LOW (ref 80.0–100.0)
Monocytes Absolute: 0.8 10*3/uL (ref 0.1–1.0)
Monocytes Relative: 9 %
Neutro Abs: 5.3 10*3/uL (ref 1.7–7.7)
Neutrophils Relative %: 62 %
Platelets: 381 10*3/uL (ref 150–400)
RBC: 5.85 MIL/uL — ABNORMAL HIGH (ref 3.87–5.11)
RDW: 18.2 % — ABNORMAL HIGH (ref 11.5–15.5)
WBC: 8.6 10*3/uL (ref 4.0–10.5)
nRBC: 0 % (ref 0.0–0.2)

## 2023-08-18 LAB — IRON AND TIBC
Iron: 46 ug/dL (ref 28–170)
Saturation Ratios: 13 % (ref 10.4–31.8)
TIBC: 368 ug/dL (ref 250–450)
UIBC: 322 ug/dL

## 2023-08-18 LAB — COMPREHENSIVE METABOLIC PANEL WITH GFR
ALT: 12 U/L (ref 0–44)
AST: 16 U/L (ref 15–41)
Albumin: 3.6 g/dL (ref 3.5–5.0)
Alkaline Phosphatase: 84 U/L (ref 38–126)
Anion gap: 7 (ref 5–15)
BUN: 17 mg/dL (ref 8–23)
CO2: 28 mmol/L (ref 22–32)
Calcium: 9.8 mg/dL (ref 8.9–10.3)
Chloride: 105 mmol/L (ref 98–111)
Creatinine, Ser: 1.03 mg/dL — ABNORMAL HIGH (ref 0.44–1.00)
GFR, Estimated: 60 mL/min — ABNORMAL LOW (ref >60)
Glucose, Bld: 124 mg/dL — ABNORMAL HIGH (ref 70–99)
Potassium: 4.2 mmol/L (ref 3.5–5.1)
Sodium: 140 mmol/L (ref 135–145)
Total Bilirubin: 0.5 mg/dL (ref 0.0–1.2)
Total Protein: 7.9 g/dL (ref 6.5–8.1)

## 2023-08-18 LAB — FERRITIN: Ferritin: 24 ng/mL (ref 11–307)

## 2023-08-18 NOTE — Telephone Encounter (Signed)
 PAP: Application for Eliquis has been submitted to [employer] ([employer]), via fax

## 2023-08-21 ENCOUNTER — Other Ambulatory Visit (HOSPITAL_COMMUNITY): Payer: Self-pay

## 2023-08-21 ENCOUNTER — Telehealth: Payer: Self-pay | Admitting: Pharmacist

## 2023-08-21 NOTE — Telephone Encounter (Signed)
 Pharmacy Patient Advocate Encounter  Received notification from [facility] that Prior Authorization for Emgality  120MG /ML auto-injectors (migraine) has been APPROVED from 08/21/2023 to 05/01/2024. Ran test claim, Copay is $12.15. This test claim was processed through [facility]- copay amounts may vary at other pharmacies due to pharmacy/plan contracts, or as the patient moves through the different stages of their insurance plan.   PA #/Case ID/Reference #:  [id number]

## 2023-08-21 NOTE — Telephone Encounter (Signed)
 Pharmacy Patient Advocate Encounter   Received notification from Patient Pharmacy that prior authorization for Emgality  120MG /ML auto-injectors (migraine) is required/requested.   Insurance verification completed.   The patient is insured through [facility] .   Per test claim: PA required; PA submitted to above mentioned insurance via CoverMyMeds Key/confirmation #/EOC [id number] Status is pending

## 2023-08-22 NOTE — Progress Notes (Unsigned)
 [facility] [address], [place] [address]   CLINIC:  Medical Oncology/Hematology  PCP:  Meldon Sport, MD [address] [place] [address] [phone]   REASON FOR VISIT:  Follow-up for lymphadenopathy and iron  deficiency anemia   PRIOR THERAPY: None   CURRENT THERAPY: Intermittent IV iron , iron  tablet daily  INTERVAL HISTORY:   Monique Gomez 68 y.o. female returns for routine follow-up of lymphadenopathy and iron  deficiency anemia.  She was last seen by NP Charlton Cooler on 02/16/2023.  At today's visit, she reports feeling fairly well apart from some back pain.  ***No recent hospitalizations, surgeries, or changes in baseline health status.   She continues to take her iron  tablet once daily, but has to miss some days due to diarrhea.*** ***  She denies any gross hematochezia, melena, or epistaxis. ***  She reports that her energy is good. *** Her migraine headaches have resolved after starting Botox  injections. *** She has some mild shortness of breath with exertion. *** Denies any restless legs, pica, chest pain, lightheadedness, or syncope.   She denies any current lymphadenopathy.  *** *** She reports that she continues to see Dr. Darlin Ehrlich due to chronic inflammation and swelling of her nasal passages related to narrow nasal passages. *** She denies any recent infections, fever, chills, night sweats, or unintentional weight loss.   She has 80***% energy and 100***% appetite.  Her weight has been stable.  ASSESSMENT & PLAN:  1.  Subcentimeter left supraclavicular adenopathy, waxing and waning - She reported lymphadenopathy since ~September 2020 - Ultrasound soft tissue neck on 01/25/2019 shows left supraclavicular lymph node measuring 1 cm.  A CT of the soft tissue of the neck on the same day showed 3 small supraclavicular lymph nodes, largest measuring 8-9 mm.  Small posterior triangle lymph node, none more than 9 mm.  Consistent with mild  reactive adenitis. - CT soft tissue neck with contrast on 02/22/2019 showed right level 2 lymph node measuring 6 mm unchanged.  Subcentimeter posterior triangle lymph nodes on the right unchanged.  Largest measures 8 mm.  Left level 2 lymph node measures 8.6 mm unchanged.  Cluster of supraclavicular lymph nodes on the left also slightly smaller.  Largest measures 5 mm.  No oropharyngeal masses. - CT soft tissue neck on 06/17/2019 shows left level 2 lymph node measuring 1.2 cm, previously 1.1 cm.  Stable cluster of nonenlarged left supraclavicular lymph nodes. - CT angiogram from 05/13/2020 which showed no lymphadenopathy in the base of the neck.  No thoracic adenopathy.  Diffuse normal-sized mediastinal and axillary lymph nodes noted. - Former smoker.  She smoked 1 pack of cigarettes per week for 35 years.  She quit smoking in 2013. - She sees Dr. Darlin Ehrlich for chronic sinusitis*** - No recent infections or antibiotics*** - She does not report any fevers, night sweats or weight loss in the last 6 months.   *** - Physical examination today*** did not reveal any palpable adenopathy in the neck region or axillary region.    - Most recent CBC (08/18/2023): Normal WBC and differential. - Suspect intermittent reactive lymphadenitis/lymphadenopathy, likely related to flareups of her chronic sinusitis - PLAN: No further work-up indicated at this time.  We will reassess with physical exam and follow-up visit in 1 year, or sooner if needed based on new symptoms.  2.  Iron  deficiency anemia, with chronic microcytosis: - She reports taking iron  tablet daily for many years.  *** - EGD/colonoscopy (02/02/2015):  Nonerosive gastritis, no source for anemia identified on EGD.  Polyp and diverticulosis, rectal bleeding from small internal hemorrhoids. - No major bleeding episodes such as epistaxis, hematemesis, hematochezia, or melena*** - Most recent IV iron  with Venofer  1000 mg divided doses in December 2023/January 2024 -  Most recent labs (08/18/2023): Hgb 12.6/MCV 72.3, ferritin 24, iron  saturation 13%.   - Longstanding history of microcytic erythrocytosis, regardless of hemoglobin levels.  Hemoglobin fractionation cascade was negative, but this does not rule out alpha thalassemia trait.   - PLAN: Recommend IV Venofer  400 mg x 2.  *** - Continue oral iron  supplementation - Repeat CBC & iron  panel and follow-up in 1 year. - We discussed suspected alpha thalassemia trait, but patient DECLINES genetic testing, as it would be unlikely to change overall management.***    PLAN SUMMARY: >> IV Venofer  400 mg x 2 *** >> Labs in 1 year *** = CBC/D, ferritin, iron /TIBC >> OFFICE visit after labs     REVIEW OF SYSTEMS: ***  Review of Systems  Constitutional:  Positive for fatigue (mild). Negative for appetite change, chills, diaphoresis, fever and unexpected weight change.  HENT:   Negative for lump/mass and nosebleeds.   Eyes:  Negative for eye problems.  Respiratory:  Positive for shortness of breath. Negative for cough and hemoptysis.   Cardiovascular:  Negative for chest pain, leg swelling and palpitations.  Gastrointestinal:  Negative for abdominal pain, blood in stool, constipation, diarrhea, nausea and vomiting.  Genitourinary:  Negative for hematuria.   Musculoskeletal:  Positive for back pain.  Skin: Negative.   Neurological:  Positive for headaches. Negative for dizziness and light-headedness.  Hematological:  Does not bruise/bleed easily.     PHYSICAL EXAM:***  ECOG PERFORMANCE STATUS: 1 - Symptomatic but completely ambulatory  There were no vitals filed for this visit. There were no vitals filed for this visit. Physical Exam Constitutional:      Appearance: Normal appearance. She is morbidly obese.  Cardiovascular:     Heart sounds: Normal heart sounds.  Pulmonary:     Breath sounds: Normal breath sounds.  Lymphadenopathy:     Head:     Right side of head: No submental, submandibular,  tonsillar, preauricular, posterior auricular or occipital adenopathy.     Left side of head: No submental, submandibular, tonsillar, preauricular, posterior auricular or occipital adenopathy.     Cervical: No cervical adenopathy.     Right cervical: No superficial, deep or posterior cervical adenopathy.    Left cervical: No superficial, deep or posterior cervical adenopathy.     Upper Body:     Right upper body: No supraclavicular, axillary, pectoral or epitrochlear adenopathy.     Left upper body: No supraclavicular, axillary, pectoral or epitrochlear adenopathy.  Neurological:     General: No focal deficit present.     Mental Status: Mental status is at baseline.  Psychiatric:        Behavior: Behavior normal. Behavior is cooperative.     PAST MEDICAL/SURGICAL HISTORY:  Past Medical History:  Diagnosis Date   Abdominal fibromatosis    Allergic rhinitis    Anemia    Asthma    Essential hypertension    GERD (gastroesophageal reflux disease)    History of cardiac catheterization    No significant CAD May 2015   History of migraine headaches    Hyperlipidemia    Iron  deficiency anemia 02/05/2021   Myocarditis (HCC)    a. diagnosed in 05/2020 with cath showing normal cors   Obesity  PAF (paroxysmal atrial fibrillation) (HCC) 08/2013   Sleep apnea    Type 2 diabetes mellitus [facility])    Past Surgical History:  Procedure Laterality Date   CATARACT EXTRACTION     CATARACT EXTRACTION W/PHACO Right 04/05/2019   Procedure: CATARACT EXTRACTION PHACO AND INTRAOCULAR LENS PLACEMENT RIGHT EYE (CDE: 3.19);  Surgeon: Tarri Farm, MD;  Location: [facility];  Service: Ophthalmology;  Laterality: Right;   COLONOSCOPY N/A 02/02/2015   SLF: 1. one colon polyp removed-no source for anemia identified. 2. moderate diverticulosis noted in the sigmoid colon and descending colon 3. the left colon is redundant 4. Rectal bleeding due ot small internal hemorroids 5. Moderate sized external hemorrhoids.    ESOPHAGOGASTRODUODENOSCOPY N/A 02/02/2015   SLF: 1. 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 large hiatal hernia 3. mild non-erosive gastritis 4. No source for anemia identified.    LEFT HEART CATH AND CORONARY ANGIOGRAPHY N/A 05/14/2020   Procedure: LEFT HEART CATH AND CORONARY ANGIOGRAPHY;  Surgeon: Arty Binning, MD;  Location: [facility] INVASIVE CV LAB;  Service: Cardiovascular;  Laterality: N/A;   LEFT HEART CATHETERIZATION WITH CORONARY ANGIOGRAM N/A 09/03/2013   Procedure: LEFT HEART CATHETERIZATION WITH CORONARY ANGIOGRAM;  Surgeon: Lucendia Rusk, MD;  Location: [facility] CATH LAB;  Service: Cardiovascular;  Laterality: N/A;   Right carpal tunnel release      SOCIAL HISTORY:  Social History   Socioeconomic History   Marital status: Single    Spouse name: Not on file   Number of children: 2   Years of education: Not on file   Highest education level: Not on file  Occupational History   Occupation: Child psychotherapist    Employer: [employer]  Tobacco Use   Smoking status: Former    Current packs/day: 0.00    Types: Cigarettes    Quit date: 04/01/2012    Years since quitting: 11.3   Smokeless tobacco: Never  Vaping Use   Vaping status: Never Used  Substance and Sexual Activity   Alcohol use: Not Currently    Alcohol/week: 0.0 standard drinks of alcohol    Comment: very occasional   Drug use: No   Sexual activity: Not Currently    Birth control/protection: Post-menopausal  Other Topics Concern   Not on file  Social History Narrative   Not on file   Social Drivers of Health   Financial Resource Strain: Low Risk  (01/23/2023)   Overall Financial Resource Strain (CARDIA)    Difficulty of Paying Living Expenses: Not hard at all  Food Insecurity: No Food Insecurity (01/23/2023)   Hunger Vital Sign    Worried About Running Out of Food in the Last Year: Never true    Ran Out of Food in the Last Year: Never true  Transportation Needs: No Transportation Needs (01/23/2023)    PRAPARE - Administrator, Civil Service (Medical): No    Lack of Transportation (Non-Medical): No  Physical Activity: Insufficiently Active (01/23/2023)   Exercise Vital Sign    Days of Exercise per Week: 7 days    Minutes of Exercise per Session: 20 min  Stress: No Stress Concern Present (01/23/2023)   [employer] of Occupational Health - Occupational Stress Questionnaire    Feeling of Stress : Not at all  Social Connections: Socially Integrated (01/23/2023)   Social Connection and Isolation Panel [NHANES]    Frequency of Communication with Friends and Family: More than three times a week    Frequency of Social Gatherings with Friends and  Family: More than three times a week    Attends Religious Services: More than 4 times per year    Active Member of Clubs or Organizations: Yes    Attends Banker Meetings: More than 4 times per year    Marital Status: Married  Catering manager Violence: Not At Risk (01/23/2023)   Humiliation, Afraid, Rape, and Kick questionnaire    Fear of Current or Ex-Partner: No    Emotionally Abused: No    Physically Abused: No    Sexually Abused: No    FAMILY HISTORY:  Family History  Problem Relation Age of Onset   Diabetes Mother    Hypertension Mother    Heart disease Mother    High Cholesterol Mother    Alzheimer's disease Father    Hypertension Sister    Hypertension Brother    Diabetes Brother    Arthritis Sister    Hypertension Sister    High Cholesterol Sister    Diabetes Son    Developmental delay Son    Colon cancer Neg Hx     CURRENT MEDICATIONS:  Outpatient Encounter Medications as of 08/23/2023  Medication Sig   acetaminophen  (TYLENOL ) 500 MG tablet Take 1 tablet (500 mg total) by mouth every 6 (six) hours as needed.   albuterol  (VENTOLIN  HFA) 108 (90 Base) MCG/ACT inhaler INHALE 2 PUFFS BY MOUTH EVERY 6 HOURS AS NEEDED FOR WHEEZING OR SHORTNESS OF BREATH   apixaban  (ELIQUIS ) 5 MG TABS tablet Take 1  tablet (5 mg total) by mouth 2 (two) times daily.   atorvastatin  (LIPITOR ) 80 MG tablet Take 1 tablet (80 mg total) by mouth daily.   blood glucose meter kit and supplies KIT Dispense based on patient and insurance preference. Use up to four times daily as directed.   BOTOX  200 units injection PROVIDER TO INJECT 155 UNITS INTO THE MUSCLES OF THE HEAD AND NECK EVERY 3 MONTHS. DISCARD REMAINDER   conjugated estrogens  (PREMARIN ) vaginal cream Apply 0.5 gm which is a pea-sized amount with the applicator or fingertip vaginally at bedtime for 2 weeks and then 2-3 x weekly.   cyclobenzaprine  (FLEXERIL ) 10 MG tablet One tablet every twelve hours as needed for spasm.   empagliflozin  (JARDIANCE ) 10 MG TABS tablet Take 1 tablet (10 mg total) by mouth daily before breakfast.   esomeprazole  (NEXIUM ) 40 MG capsule Take 1 capsule by mouth once daily   fluticasone  (FLONASE ) 50 MCG/ACT nasal spray Place 2 sprays into both nostrils daily.   gabapentin  (NEURONTIN ) 100 MG capsule Take 1 capsule (100 mg total) by mouth at bedtime.   Galcanezumab -gnlm (EMGALITY ) 120 MG/ML SOAJ INJECT 120 MG  SUBCUTANEOUSLY ONCE EVERY MONTH   ipratropium (ATROVENT ) 0.03 % nasal spray Place 2 sprays into both nostrils every 12 (twelve) hours.   Iron -FA-B Cmp-C-Biot-Probiotic (FUSION PLUS) CAPS Take 1 capsule by mouth daily.   meclizine  (ANTIVERT ) 25 MG tablet Take 1 tablet (25 mg total) by mouth 3 (three) times daily as needed for dizziness.   metFORMIN  (GLUCOPHAGE ) 500 MG tablet TAKE 1 TABLET BY MOUTH TWICE DAILY WITH A MEAL   metoCLOPramide  (REGLAN ) 10 MG tablet Take 1 tablet (10 mg total) by mouth every 8 (eight) hours as needed for up to 7 days for nausea (Headache).   metoprolol  succinate (TOPROL -XL) 50 MG 24 hr tablet TAKE 1 TABLET BY MOUTH IN THE MORNING AND 1 AT BEDTIME WITH OR  IMMEDIATELY  FOLLOWING  A  MEAL.   sacubitril -valsartan  (ENTRESTO ) 49-51 MG Take 1 tablet by mouth  2 (two) times daily.   sacubitril -valsartan  (ENTRESTO )  97-103 MG Take 1 tablet by mouth 2 (two) times daily.   topiramate  (TOPAMAX ) 50 MG tablet Take 2 tablets (100 mg total) by mouth at bedtime.   Ubrogepant  (UBRELVY ) 50 MG TABS TAKE ONE TABLET BY MOUTH AT THE ONSET OF A MIGRAINE. CAN REPEAT IN 2 HOURS IF NEEDED   No facility-administered encounter medications on file as of 08/23/2023.    ALLERGIES:  Allergies  Allergen Reactions   Venlafaxine  Nausea And Vomiting and Other (See Comments)    Dizziness, shakiness    LABORATORY DATA:  I have reviewed the labs as listed.  CBC    Component Value Date/Time   WBC 8.6 08/18/2023 1005   RBC 5.85 (H) 08/18/2023 1005   HGB 12.6 08/18/2023 1005   HGB 13.0 05/26/2023 0956   HCT 42.3 08/18/2023 1005   HCT 44.8 05/26/2023 0956   PLT 381 08/18/2023 1005   PLT 380 05/26/2023 0956   MCV 72.3 (L) 08/18/2023 1005   MCV 74 (L) 05/26/2023 0956   MCH 21.5 (L) 08/18/2023 1005   MCHC 29.8 (L) 08/18/2023 1005   RDW 18.2 (H) 08/18/2023 1005   RDW 15.6 (H) 05/26/2023 0956   LYMPHSABS 2.3 08/18/2023 1005   LYMPHSABS 2.3 05/26/2023 0956   MONOABS 0.8 08/18/2023 1005   EOSABS 0.2 08/18/2023 1005   EOSABS 0.2 05/26/2023 0956   BASOSABS 0.0 08/18/2023 1005   BASOSABS 0.0 05/26/2023 0956      Latest Ref Rng & Units 08/18/2023   10:05 AM 05/26/2023    9:56 AM 01/23/2023    9:32 AM  CMP  Glucose 70 - 99 mg/dL 409  811  914   BUN 8 - 23 mg/dL 17  13  17    Creatinine 0.44 - 1.00 mg/dL 7.82  9.56  2.13   Sodium 135 - 145 mmol/L 140  143  145   Potassium 3.5 - 5.1 mmol/L 4.2  4.5  4.1   Chloride 98 - 111 mmol/L 105  103  107   CO2 22 - 32 mmol/L 28  25  23    Calcium  8.9 - 10.3 mg/dL 9.8  9.7  9.4   Total Protein 6.5 - 8.1 g/dL 7.9  7.0  6.9   Total Bilirubin 0.0 - 1.2 mg/dL 0.5  <0.8  0.2   Alkaline Phos 38 - 126 U/L 84  104  93   AST 15 - 41 U/L 16  17  15    ALT 0 - 44 U/L 12  14  10      DIAGNOSTIC IMAGING:  I have independently reviewed the relevant imaging and discussed with the patient.   WRAP  UP:  All questions were answered. The patient knows to call the clinic with any problems, questions or concerns.  Medical decision making: Moderate***  Time spent on visit: I spent 20 minutes counseling the patient face to face. The total time spent in the appointment was 30 minutes and more than 50% was on counseling.  Sonnie Dusky, PA-C  ***

## 2023-08-23 ENCOUNTER — Inpatient Hospital Stay (HOSPITAL_BASED_OUTPATIENT_CLINIC_OR_DEPARTMENT_OTHER): Payer: Medicare Other | Admitting: Physician Assistant

## 2023-08-23 ENCOUNTER — Encounter: Payer: Self-pay | Admitting: Physician Assistant

## 2023-08-23 VITALS — BP 139/86 | HR 98 | Temp 98.1°F | Resp 18 | Wt 262.3 lb

## 2023-08-23 DIAGNOSIS — D508 Other iron deficiency anemias: Secondary | ICD-10-CM

## 2023-08-23 DIAGNOSIS — R591 Generalized enlarged lymph nodes: Secondary | ICD-10-CM | POA: Diagnosis not present

## 2023-08-23 DIAGNOSIS — D509 Iron deficiency anemia, unspecified: Secondary | ICD-10-CM | POA: Diagnosis not present

## 2023-08-23 DIAGNOSIS — R590 Localized enlarged lymph nodes: Secondary | ICD-10-CM | POA: Diagnosis not present

## 2023-08-23 DIAGNOSIS — Z87891 Personal history of nicotine dependence: Secondary | ICD-10-CM | POA: Diagnosis not present

## 2023-08-23 DIAGNOSIS — R718 Other abnormality of red blood cells: Secondary | ICD-10-CM

## 2023-08-23 MED ORDER — FUSION PLUS PO CAPS: 1.0000 | ORAL_CAPSULE | Freq: Every day | ORAL | 11 refills | Status: AC

## 2023-08-23 NOTE — Patient Instructions (Signed)
 [facility] at [facility] Discharge Instructions  You were seen today by Sheril Dines PA-C for your iron  deficiency anemia and your lymph node swelling.  IRON  DEFICIENCY ANEMIA:  Your blood count is normal at this visit.    Your iron  levels are mildly low, but since you are not having any symptoms of iron  deficiency, we will hold off from giving any IV iron  at this time. We will recheck you iron  levels in 6 months, but if you have any severe fatigue or ice cravings before that time, please reach out sooner! Continue to take your iron  tablet once daily.    LYMPH NODE SWELLING: No abnormal lymph nodes were found on exam today.  I suspect that the on-and-off lymph node swelling you experience is secondary to inflammation in your nasal cavity.  If you have any lymph node swelling that does not go away, please let us  know.  MEDICATIONS: Continue taking iron  tablet daily.  FOLLOW-UP APPOINTMENT: Office visit in 6 months (with labs the week before)  ** Thank you for trusting me with your healthcare!  I strive to provide all of my patients with quality care at each visit.  If you receive a survey for this visit, I would be so grateful to you for taking the time to provide feedback.  Thank you in advance!  ~ Deaja Rizo                   Dr. Paulett Boros   &   Sheril Dines, PA-C   - - - - - - - - - - - - - - - - - -     Thank you for choosing [facility] at [facility] to provide your oncology and hematology care.  To afford each patient quality time with our provider, please arrive at least 15 minutes before your scheduled appointment time.   If you have a lab appointment with the Cancer Center please come in thru the Main Entrance and check in at the main information desk.  You need to re-schedule your appointment should you arrive 10 or more minutes late.  We strive to give you quality time with our providers, and arriving late affects  you and other patients whose appointments are after yours.  Also, if you no show three or more times for appointments you may be dismissed from the clinic at the providers discretion.     Again, thank you for choosing [facility].  Our hope is that these requests will decrease the amount of time that you wait before being seen by our physicians.       _____________________________________________________________  Should you have questions after your visit to [facility], please contact our office at [phone] and follow the prompts.  Our office hours are 8:00 a.m. and 4:30 p.m. Monday - Friday.  Please note that voicemails left after 4:00 p.m. may not be returned until the following business day.  We are closed weekends and major holidays.  You do have access to a nurse 24-7, just call the main number to the clinic [phone] and do not press any options, hold on the line and a nurse will answer the phone.    For prescription refill requests, have your pharmacy contact our office and allow 72 hours.    Due to Covid, you will need to wear a mask upon entering the hospital. If you do not have a mask, a  mask will be given to you at the Main Entrance upon arrival. For doctor visits, patients may have 1 support person age 44 or older with them. For treatment visits, patients can not have anyone with them due to social distancing guidelines and our immunocompromised population.

## 2023-08-24 ENCOUNTER — Ambulatory Visit: Payer: Medicare HMO | Admitting: Oncology

## 2023-08-25 ENCOUNTER — Ambulatory Visit: Admitting: Orthopedic Surgery

## 2023-08-25 DIAGNOSIS — M1811 Unilateral primary osteoarthritis of first carpometacarpal joint, right hand: Secondary | ICD-10-CM

## 2023-08-25 DIAGNOSIS — M180 Bilateral primary osteoarthritis of first carpometacarpal joints: Secondary | ICD-10-CM | POA: Diagnosis not present

## 2023-08-25 DIAGNOSIS — M1812 Unilateral primary osteoarthritis of first carpometacarpal joint, left hand: Secondary | ICD-10-CM

## 2023-08-25 MED ORDER — METHYLPREDNISOLONE ACETATE 40 MG/ML IJ SUSP
40.0000 mg | Freq: Once | INTRAMUSCULAR | Status: AC
  Administered 2023-08-25: 40 mg via INTRA_ARTICULAR

## 2023-08-25 NOTE — Telephone Encounter (Signed)
 Provider form faxed back to [phone].

## 2023-08-25 NOTE — Telephone Encounter (Signed)
 Hi [facility] is saying they do not have the provider form. I have scanned in under "Eliquis  [facility] blank provider form" in media a blank form if someone can get the provider to sign and fax it back to [phone] please and thank you!

## 2023-08-25 NOTE — Telephone Encounter (Signed)
 Faxed [facility] provider form

## 2023-08-25 NOTE — Progress Notes (Signed)
 Chief Complaint  Patient presents with   Hand Pain    Injection bil thumbs     Monique Gomez request bilateral thumb injections  She has had them in the past  She is done very well the last 1 was in January 2024  She has CMC arthritis  Procedure note injection left thumb  Injection left CMC joint Medication Depo-Medrol  40 mg and lidocaine  1% 2 cc The patient gave verbal consent Timeout confirmed the site of injection left CMC joint  Alcohol and ethyl chloride was used to repair the skin and then a 25-gauge needle was used to inject the [facility] joint of the left thumb  There were no complications a sterile Band-Aid was applied    Procedure note injection right thumb  Injection right CMC joint Medication Depo-Medrol  40 mg and lidocaine  1% 2 cc The patient gave verbal consent Timeout confirmed the site of injection right CMC joint  Alcohol and ethyl chloride was used to repair the skin and then a 25-gauge needle was used to inject the [facility] joint of the right thumb  There were no complications a sterile Band-Aid was applied   Encounter Diagnoses  Name Primary?   Arthritis of carpometacarpal [facility]) joint of left thumb Yes   Arthritis of carpometacarpal (CMC) joint of right thumb

## 2023-08-28 NOTE — Telephone Encounter (Signed)
 I called and they said this is still in process [id number] -they said they sent it to be reviewed today

## 2023-08-30 NOTE — Telephone Encounter (Signed)
 Scanned in media application

## 2023-09-01 NOTE — Telephone Encounter (Signed)
 They said she was denied due to her making too much but they are going to see if they can correct the financials and get her approved

## 2023-09-05 NOTE — Telephone Encounter (Signed)
 [facility] denied saying she needs a low subsidence income letter from social security  I called the patient to see if she can get that letter so we can re-apply and had to lmom

## 2023-09-05 NOTE — Telephone Encounter (Signed)
 Pt said she will bring the letter to the doctor office tomorrow

## 2023-09-06 ENCOUNTER — Other Ambulatory Visit (HOSPITAL_COMMUNITY): Payer: Self-pay

## 2023-09-06 ENCOUNTER — Ambulatory Visit: Payer: Medicare Other | Admitting: Neurology

## 2023-09-06 VITALS — BP 147/95

## 2023-09-06 DIAGNOSIS — G43709 Chronic migraine without aura, not intractable, without status migrainosus: Secondary | ICD-10-CM

## 2023-09-06 MED ORDER — ONABOTULINUMTOXINA 200 UNITS IJ SOLR
155.0000 [IU] | Freq: Once | INTRAMUSCULAR | Status: AC
  Administered 2023-09-06: 155 [IU] via INTRAMUSCULAR

## 2023-09-06 NOTE — Telephone Encounter (Signed)
 Patient brought SS benefits in office. Fax to [phone]

## 2023-09-06 NOTE — Telephone Encounter (Signed)
 Faxed to [facility] but this was an approval of ss benefits and [facility] usually needs a denial of extra help.

## 2023-09-06 NOTE — Progress Notes (Signed)
   BOTOX  PROCEDURE NOTE FOR MIGRAINE HEADACHE  HISTORY: Monique Gomez is here for Botox .  Last was 06/14/2023 with me. Did great these last 3 months! No migraines.  Back on Emgality , hasn't had it since Feb, has missed the last 2 months. Approved for social security assistance. She stopped Topamax . Has not needed any Ubrelvy . She feels so good! She is going to see about getting part time job next week at [employer].   Description of procedure:  The patient was placed in a sitting position. The standard protocol was used for Botox  as follows, with 5 units of Botox  injected at each site:  -Procerus muscle, midline injection  -Corrugator muscle, bilateral injection  -Frontalis muscle, bilateral injection, with 2 sites each side, medial injection was performed in the upper one third of the frontalis muscle, in the region vertical from the medial inferior edge of the superior orbital rim. The lateral injection was again in the upper one third of the forehead vertically above the lateral limbus of the cornea, 1.5 cm lateral to the medial injection site.  -Temporalis muscle injection, 4 sites, bilaterally. The first injection was 3 cm above the tragus of the ear, second injection site was 1.5 cm to 3 cm up from the first injection site in line with the tragus of the ear. The third injection site was 1.5-3 cm forward between the first 2 injection sites. The fourth injection site was 1.5 cm posterior to the second injection site.  -Occipitalis muscle injection, 3 sites, bilaterally. The first injection was done one half way between the occipital protuberance and the tip of the mastoid process behind the ear. The second injection site was done lateral and superior to the first, 1 fingerbreadth from the first injection. The third injection site was 1 fingerbreadth superiorly and medially from the first injection site.  -Cervical paraspinal muscle injection, 2 sites, bilateral, the first injection site was  1 cm from the midline of the cervical spine, 3 cm inferior to the lower border of the occipital protuberance. The second injection site was 1.5 cm superiorly and laterally to the first injection site.  -Trapezius muscle injection was performed at 3 sites, bilaterally. The first injection site was in the upper trapezius muscle halfway between the inflection point of the neck, and the acromion. The second injection site was one half way between the acromion and the first injection site. The third injection was done between the first injection site and the inflection point of the neck.   A 200 unit bottle of Botox  was used, 155 units were injected, the rest of the Botox  was wasted. The patient tolerated the procedure well, there were no complications of the above procedure.  Botox  NDC 2956-2130-86 Lot number [id number] Expiration date 01/2026 BB  She will remain off Topamax .  She may also remain off Emgality  if her headaches remain under excellent control.

## 2023-09-06 NOTE — Progress Notes (Signed)
 Botox - 200 units x 1 vial Lot: [id number] Expiration: 2027/10 NDC: 0023-3921-02  Bacteriostatic 0.9% Sodium Chloride - 4 mL  Lot: [id number] Expiration: 03/02/24 NDC: 7253664403  Dx:  Chronic migraine without aura, not intractable, without status migrainosus  - Primary G43.709   Chronic migraine without aura without status migrainosus, not intractable G43.709   B/B Witnessed by APRIL RN

## 2023-09-08 NOTE — Telephone Encounter (Signed)
 Spoke to patient and she said she is able to get her medications for 12.00 now and she is good with that

## 2023-09-08 NOTE — Telephone Encounter (Signed)
 Spoke to [facility] and they said she was approved by help with social security, they would be the ones to cover her eliquis . They said for her to call social security assistance to see how that works.lmom for pt

## 2023-09-11 ENCOUNTER — Other Ambulatory Visit: Payer: Self-pay | Admitting: Internal Medicine

## 2023-09-11 DIAGNOSIS — K219 Gastro-esophageal reflux disease without esophagitis: Secondary | ICD-10-CM

## 2023-09-26 ENCOUNTER — Ambulatory Visit (INDEPENDENT_AMBULATORY_CARE_PROVIDER_SITE_OTHER): Payer: Self-pay | Admitting: Internal Medicine

## 2023-09-26 ENCOUNTER — Encounter: Payer: Self-pay | Admitting: Internal Medicine

## 2023-09-26 VITALS — BP 135/82 | HR 107 | Ht 62.0 in | Wt 260.0 lb

## 2023-09-26 DIAGNOSIS — M180 Bilateral primary osteoarthritis of first carpometacarpal joints: Secondary | ICD-10-CM

## 2023-09-26 DIAGNOSIS — G5603 Carpal tunnel syndrome, bilateral upper limbs: Secondary | ICD-10-CM

## 2023-09-26 DIAGNOSIS — M5416 Radiculopathy, lumbar region: Secondary | ICD-10-CM

## 2023-09-26 DIAGNOSIS — E1169 Type 2 diabetes mellitus with other specified complication: Secondary | ICD-10-CM | POA: Diagnosis not present

## 2023-09-26 DIAGNOSIS — K219 Gastro-esophageal reflux disease without esophagitis: Secondary | ICD-10-CM

## 2023-09-26 DIAGNOSIS — I10 Essential (primary) hypertension: Secondary | ICD-10-CM | POA: Diagnosis not present

## 2023-09-26 DIAGNOSIS — I480 Paroxysmal atrial fibrillation: Secondary | ICD-10-CM

## 2023-09-26 DIAGNOSIS — Z7984 Long term (current) use of oral hypoglycemic drugs: Secondary | ICD-10-CM | POA: Diagnosis not present

## 2023-09-26 DIAGNOSIS — Z135 Encounter for screening for eye and ear disorders: Secondary | ICD-10-CM

## 2023-09-26 MED ORDER — METFORMIN HCL 500 MG PO TABS: 500.0000 mg | ORAL_TABLET | Freq: Two times a day (BID) | ORAL | 3 refills | Status: DC

## 2023-09-26 MED ORDER — GABAPENTIN 300 MG PO CAPS: 300.0000 mg | ORAL_CAPSULE | Freq: Every day | ORAL | 1 refills | Status: DC

## 2023-09-26 MED ORDER — LIDOCAINE 5 % EX PTCH: 1.0000 | MEDICATED_PATCH | CUTANEOUS | 0 refills | Status: DC

## 2023-09-26 NOTE — Assessment & Plan Note (Addendum)
 Has numbness and tingling of the b/l hands, likely carpal tunnel syndrome Advised to use wrist brace If persistent, advised to contact orthopedic surgeon

## 2023-09-26 NOTE — Patient Instructions (Addendum)
 Please schedule Mammogram.  Please start taking Gabapentin  300 mg once at bedtime.  Please use Lidocaine  patch for back pain as needed.  Please continue to take medications as prescribed.  Please continue to follow low carb diet and perform moderate exercise/walking at least 150 mins/week.

## 2023-09-26 NOTE — Progress Notes (Signed)
 Established Patient Office Visit  Subjective:  Patient ID: Monique Gomez, female    DOB: Nov 26, 1955  Age: 68 y.o. MRN: [id number]  CC:  Chief Complaint  Patient presents with   Medical Management of Chronic Issues    4 month f/u. Would like referral placed again for eye doctor.    HPI Monique Gomez is a 68 y.o. female with past medical history of paroxysmal atrial fibrillation, hypertension, myocarditis, OSA on CPAP, GERD, DM, HLD and morbid obesity who presents for f/u of chronic medical conditions.  BP is well-controlled. Takes medications regularly. Patient denies  chest pain or palpitations.  She takes Entresto  for history of hypertrophic cardiomegaly.  She is followed by cardiology and heart failure clinic.  She reports exertional dyspnea, but denies any orthopnea or PND.  Denies any leg swelling.  Denies any dizziness or palpitations currently.  She has chronic nasal congestion and postnasal drip.  She has been using Zyrtec  and Flonase  for it.  Denies any fever or chills.  She has history of DM, for which she takes metformin . She is taking Jardiance  for history of myocarditis/CHF.  She denies any fatigue, polyuria or polydipsia currently.  She uses CPAP for OSA.   She is on Emgality  and Topamax  for migraine.  She also gets Botox  injections for it.  Followed by neurology.  She has chronic low back pain, for which she has seen orthopedic surgeon and PM&R.  She has had physical therapy without much relief, but agrees to try it again.  She has also had epidural injection in 11/23, but she has recurrence of low back pain now for the last 6 months.  Denies any recent injury or fall.  She has radiating symptoms to bilateral LE at times.  Denies any urinary or stool incontinence. She had initial improvement with gabapentin  100 mg qHS, but has worsening of pain recently.  She tried lidocaine  patch recently with adequate relief.      Past Medical History:  Diagnosis Date    Abdominal fibromatosis    Allergic rhinitis    Anemia    Asthma    Essential hypertension    GERD (gastroesophageal reflux disease)    History of cardiac catheterization    No significant CAD May 2015   History of migraine headaches    Hyperlipidemia    Iron  deficiency anemia 02/05/2021   Myocarditis (HCC)    a. diagnosed in 05/2020 with cath showing normal cors   Obesity    PAF (paroxysmal atrial fibrillation) (HCC) 08/2013   Sleep apnea    Type 2 diabetes mellitus [facility])     Past Surgical History:  Procedure Laterality Date   CATARACT EXTRACTION     CATARACT EXTRACTION W/PHACO Right 04/05/2019   Procedure: CATARACT EXTRACTION PHACO AND INTRAOCULAR LENS PLACEMENT RIGHT EYE (CDE: 3.19);  Surgeon: Tarri Farm, MD;  Location: [facility];  Service: Ophthalmology;  Laterality: Right;   COLONOSCOPY N/A 02/02/2015   SLF: 1. one colon polyp removed-no source for anemia identified. 2. moderate diverticulosis noted in the sigmoid colon and descending colon 3. the left colon is redundant 4. Rectal bleeding due ot small internal hemorroids 5. Moderate sized external hemorrhoids.   ESOPHAGOGASTRODUODENOSCOPY N/A 02/02/2015   SLF: 1. 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 large hiatal hernia 3. mild non-erosive gastritis 4. No source for anemia identified.    LEFT HEART CATH AND CORONARY ANGIOGRAPHY N/A 05/14/2020   Procedure: LEFT HEART CATH AND CORONARY ANGIOGRAPHY;  Surgeon: Arty Binning,  MD;  Location: [facility] INVASIVE CV LAB;  Service: Cardiovascular;  Laterality: N/A;   LEFT HEART CATHETERIZATION WITH CORONARY ANGIOGRAM N/A 09/03/2013   Procedure: LEFT HEART CATHETERIZATION WITH CORONARY ANGIOGRAM;  Surgeon: Lucendia Rusk, MD;  Location: [facility] CATH LAB;  Service: Cardiovascular;  Laterality: N/A;   Right carpal tunnel release      Family History  Problem Relation Age of Onset   Diabetes Mother    Hypertension Mother    Heart disease Mother    High Cholesterol Mother     Alzheimer's disease Father    Hypertension Sister    Hypertension Brother    Diabetes Brother    Arthritis Sister    Hypertension Sister    High Cholesterol Sister    Diabetes Son    Developmental delay Son    Colon cancer Neg Hx     Social History   Socioeconomic History   Marital status: Single    Spouse name: Not on file   Number of children: 2   Years of education: Not on file   Highest education level: Not on file  Occupational History   Occupation: Child psychotherapist    Employer: [employer]  Tobacco Use   Smoking status: Former    Current packs/day: 0.00    Types: Cigarettes    Quit date: 04/01/2012    Years since quitting: 11.4   Smokeless tobacco: Never  Vaping Use   Vaping status: Never Used  Substance and Sexual Activity   Alcohol use: Not Currently    Alcohol/week: 0.0 standard drinks of alcohol    Comment: very occasional   Drug use: No   Sexual activity: Not Currently    Birth control/protection: Post-menopausal  Other Topics Concern   Not on file  Social History Narrative   Not on file   Social Drivers of Health   Financial Resource Strain: Low Risk  (01/23/2023)   Overall Financial Resource Strain (CARDIA)    Difficulty of Paying Living Expenses: Not hard at all  Food Insecurity: No Food Insecurity (01/23/2023)   Hunger Vital Sign    Worried About Running Out of Food in the Last Year: Never true    Ran Out of Food in the Last Year: Never true  Transportation Needs: No Transportation Needs (01/23/2023)   PRAPARE - Administrator, Civil Service (Medical): No    Lack of Transportation (Non-Medical): No  Physical Activity: Insufficiently Active (01/23/2023)   Exercise Vital Sign    Days of Exercise per Week: 7 days    Minutes of Exercise per Session: 20 min  Stress: No Stress Concern Present (01/23/2023)   [employer] of Occupational Health - Occupational Stress Questionnaire    Feeling of Stress : Not at all  Social Connections:  Socially Integrated (01/23/2023)   Social Connection and Isolation Panel [NHANES]    Frequency of Communication with Friends and Family: More than three times a week    Frequency of Social Gatherings with Friends and Family: More than three times a week    Attends Religious Services: More than 4 times per year    Active Member of [employer] or Organizations: Yes    Attends Banker Meetings: More than 4 times per year    Marital Status: Married  Catering manager Violence: Not At Risk (01/23/2023)   Humiliation, Afraid, Rape, and Kick questionnaire    Fear of Current or Ex-Partner: No    Emotionally Abused: No    Physically Abused: No  Sexually Abused: No    Outpatient Medications Prior to Visit  Medication Sig Dispense Refill   acetaminophen  (TYLENOL ) 500 MG tablet Take 1 tablet (500 mg total) by mouth every 6 (six) hours as needed. 30 tablet 0   albuterol  (VENTOLIN  HFA) 108 (90 Base) MCG/ACT inhaler INHALE 2 PUFFS BY MOUTH EVERY 6 HOURS AS NEEDED FOR WHEEZING OR SHORTNESS OF BREATH 18 g 0   apixaban  (ELIQUIS ) 5 MG TABS tablet Take 1 tablet (5 mg total) by mouth 2 (two) times daily. 28 tablet 0   atorvastatin  (LIPITOR ) 80 MG tablet Take 1 tablet (80 mg total) by mouth daily. 90 tablet 3   blood glucose meter kit and supplies KIT Dispense based on patient and insurance preference. Use up to four times daily as directed. 1 each 0   BOTOX  200 units injection PROVIDER TO INJECT 155 UNITS INTO THE MUSCLES OF THE HEAD AND NECK EVERY 3 MONTHS. DISCARD REMAINDER 1 each 2   conjugated estrogens  (PREMARIN ) vaginal cream Apply 0.5 gm which is a pea-sized amount with the applicator or fingertip vaginally at bedtime for 2 weeks and then 2-3 x weekly. 42.5 g 12   cyclobenzaprine  (FLEXERIL ) 10 MG tablet One tablet every twelve hours as needed for spasm. 60 tablet 2   empagliflozin  (JARDIANCE ) 10 MG TABS tablet Take 1 tablet (10 mg total) by mouth daily before breakfast. 14 tablet 0   esomeprazole   (NEXIUM ) 40 MG capsule TAKE 1 CAPSULE BY MOUTH DAILY 30 capsule 11   fluticasone  (FLONASE ) 50 MCG/ACT nasal spray Place 2 sprays into both nostrils daily. 16 g 5   Galcanezumab -gnlm (EMGALITY ) 120 MG/ML SOAJ INJECT 120 MG  SUBCUTANEOUSLY ONCE EVERY MONTH 1 mL 11   ipratropium (ATROVENT ) 0.03 % nasal spray Place 2 sprays into both nostrils every 12 (twelve) hours. 30 mL 3   Iron -FA-B Cmp-C-Biot-Probiotic (FUSION PLUS) CAPS Take 1 capsule by mouth daily. 30 capsule 11   meclizine  (ANTIVERT ) 25 MG tablet Take 1 tablet (25 mg total) by mouth 3 (three) times daily as needed for dizziness. 30 tablet 0   metoCLOPramide  (REGLAN ) 10 MG tablet Take 1 tablet (10 mg total) by mouth every 8 (eight) hours as needed for up to 7 days for nausea (Headache). 21 tablet 0   metoprolol  succinate (TOPROL -XL) 50 MG 24 hr tablet TAKE 1 TABLET BY MOUTH IN THE MORNING AND 1 AT BEDTIME WITH OR  IMMEDIATELY  FOLLOWING  A  MEAL. 180 tablet 2   sacubitril -valsartan  (ENTRESTO ) 49-51 MG Take 1 tablet by mouth 2 (two) times daily. 56 tablet 0   sacubitril -valsartan  (ENTRESTO ) 97-103 MG Take 1 tablet by mouth 2 (two) times daily. 60 tablet 1   Ubrogepant  (UBRELVY ) 50 MG TABS TAKE ONE TABLET BY MOUTH AT THE ONSET OF A MIGRAINE. CAN REPEAT IN 2 HOURS IF NEEDED 10 tablet 11   gabapentin  (NEURONTIN ) 100 MG capsule Take 1 capsule (100 mg total) by mouth at bedtime. 30 capsule 3   metFORMIN  (GLUCOPHAGE ) 500 MG tablet TAKE 1 TABLET BY MOUTH TWICE DAILY WITH A MEAL 180 tablet 0   No facility-administered medications prior to visit.    Allergies  Allergen Reactions   Venlafaxine  Nausea And Vomiting and Other (See Comments)    Dizziness, shakiness    ROS Review of Systems  Constitutional:  Negative for chills and fever.  HENT:  Positive for congestion, postnasal drip and sinus pressure. Negative for sore throat.   Eyes:  Negative for pain and discharge.  Respiratory:  Positive  for shortness of breath (Exertional, intermittent).  Negative for cough.   Cardiovascular:  Negative for chest pain and palpitations.  Gastrointestinal:  Negative for abdominal pain, diarrhea, nausea and vomiting.  Endocrine: Negative for polydipsia and polyuria.  Genitourinary:  Negative for dysuria and hematuria.  Musculoskeletal:  Positive for back pain. Negative for neck pain and neck stiffness.  Skin:  Negative for rash.  Neurological:  Positive for headaches. Negative for weakness.  Psychiatric/Behavioral:  Negative for agitation and behavioral problems.       Objective:    Physical Exam Vitals reviewed.  Constitutional:      General: She is not in acute distress.    Appearance: She is obese. She is not diaphoretic.  HENT:     Head: Normocephalic and atraumatic.     Nose: Congestion present.     Mouth/Throat:     Mouth: Mucous membranes are moist.  Eyes:     General: No scleral icterus.    Extraocular Movements: Extraocular movements intact.  Cardiovascular:     Rate and Rhythm: Normal rate and regular rhythm.     Heart sounds: Normal heart sounds. No murmur heard. Pulmonary:     Breath sounds: Normal breath sounds. No wheezing or rales.  Musculoskeletal:     Cervical back: Neck supple. No tenderness.     Right lower leg: No edema.     Left lower leg: No edema.  Skin:    General: Skin is warm.     Findings: Erythema (Chronic, over right side of face) present. No rash.  Neurological:     General: No focal deficit present.     Mental Status: She is alert and oriented to person, place, and time.     Cranial Nerves: No cranial nerve deficit.     Sensory: No sensory deficit.     Motor: No weakness.  Psychiatric:        Mood and Affect: Mood normal.        Behavior: Behavior normal.     BP 135/82   Pulse (!) 107   Ht 5\' 2"  (1.575 m)   Wt 260 lb (117.9 kg)   SpO2 95%   BMI 47.55 kg/m  Wt Readings from Last 3 Encounters:  09/26/23 260 lb (117.9 kg)  08/23/23 262 lb 5.6 oz (119 kg)  08/02/23 260 lb (117.9 kg)     Lab Results  Component Value Date   TSH 2.480 05/26/2023   Lab Results  Component Value Date   WBC 8.6 08/18/2023   HGB 12.6 08/18/2023   HCT 42.3 08/18/2023   MCV 72.3 (L) 08/18/2023   PLT 381 08/18/2023   Lab Results  Component Value Date   NA 140 08/18/2023   K 4.2 08/18/2023   CO2 28 08/18/2023   GLUCOSE 124 (H) 08/18/2023   BUN 17 08/18/2023   CREATININE 1.03 (H) 08/18/2023   BILITOT 0.5 08/18/2023   ALKPHOS 84 08/18/2023   AST 16 08/18/2023   ALT 12 08/18/2023   PROT 7.9 08/18/2023   ALBUMIN 3.6 08/18/2023   CALCIUM  9.8 08/18/2023   ANIONGAP 7 08/18/2023   EGFR 84 05/26/2023   Lab Results  Component Value Date   CHOL 165 05/26/2023   Lab Results  Component Value Date   HDL 39 (L) 05/26/2023   Lab Results  Component Value Date   LDLCALC 109 (H) 05/26/2023   Lab Results  Component Value Date   TRIG 88 05/26/2023   Lab Results  Component Value Date  CHOLHDL 4.2 05/26/2023   Lab Results  Component Value Date   HGBA1C 6.5 (H) 05/26/2023      Assessment & Plan:   Problem List Items Addressed This Visit       Cardiovascular and Mediastinum   HTN (hypertension) (Chronic)   BP Readings from Last 1 Encounters:  09/26/23 135/82   Well-controlled with Entresto  and Metoprolol  Counseled for compliance with the medications Advised DASH diet and moderate exercise/walking, at least 150 mins/week      PAF (paroxysmal atrial fibrillation) (HCC) (Chronic)   Currently in sinus rhythm On Toprol  and Eliquis  Follows up with Cardiologist        Digestive   GERD (gastroesophageal reflux disease) (Chronic)   Well controlled with Nexium  40 mg QD        Endocrine   Type 2 diabetes mellitus with other specified complication (HCC) - Primary   Lab Results  Component Value Date   HGBA1C 6.5 (H) 05/26/2023   Associated with HTN, CHF and HLD Well-controlled On Metformin  500 mg BID and Jardiance  10 mg QD Considering her comorbidities, had started  Ozempic , but did not tolerate - DC Ozempic  On statin Has an Ophthalmologist in [place], but prefers to see Dr Ambrosio Junker now, referral provided      Relevant Medications   metFORMIN  (GLUCOPHAGE ) 500 MG tablet   Other Relevant Orders   Ambulatory referral to Ophthalmology   [facility] Hb A1c Waived     Nervous and Auditory   Severe carpal tunnel syndrome of both wrists   Has numbness and tingling of the b/l hands, likely carpal tunnel syndrome Advised to use wrist brace If persistent, advised to contact orthopedic surgeon      Relevant Medications   gabapentin  (NEURONTIN ) 300 MG capsule   Lumbar radiculopathy   Chronic low back pain MRI of lumbar spine showed multilevel facet arthropathy Has been evaluated by orthopedic surgery and PM&R -had epidural injection with some relief in 2023 Weight and activities could be contributing Referred for PT Increased dose of gabapentin  to 300 mg nightly for neuropathic symptoms Would avoid oral NSAIDs due to history of GERD and her current anticoagulation Lidocaine  patch prescribed, as she has responded well to it      Relevant Medications   lidocaine  (LIDODERM ) 5 %   gabapentin  (NEURONTIN ) 300 MG capsule     Musculoskeletal and Integument   Arthritis of carpometacarpal (CMC) joint of both thumbs   Has bilateral thumb pain Followed by orthopedic surgeon, had steroid injection      Other Visit Diagnoses       Diabetic retinopathy screening       Relevant Orders   Ambulatory referral to Ophthalmology        Meds ordered this encounter  Medications   lidocaine  (LIDODERM ) 5 %    Sig: Place 1 patch onto the skin daily. Remove & Discard patch within 12 hours or as directed by MD    Dispense:  30 patch    Refill:  0   DISCONTD: gabapentin  (NEURONTIN ) 300 MG capsule    Sig: Take 1 capsule (300 mg total) by mouth at bedtime.    Dispense:  90 capsule    Refill:  1   gabapentin  (NEURONTIN ) 300 MG capsule    Sig: Take 1 capsule (300 mg  total) by mouth at bedtime.    Dispense:  90 capsule    Refill:  1   metFORMIN  (GLUCOPHAGE ) 500 MG tablet    Sig: Take 1 tablet (500  mg total) by mouth 2 (two) times daily with a meal.    Dispense:  180 tablet    Refill:  3    Follow-up: Return in about 4 months (around 01/27/2024) for DM and back pain.    Meldon Sport, MD

## 2023-09-26 NOTE — Assessment & Plan Note (Signed)
 Well-controlled with Nexium 40 mg QD

## 2023-09-26 NOTE — Assessment & Plan Note (Addendum)
 Lab Results  Component Value Date   HGBA1C 6.5 (H) 05/26/2023   Associated with HTN, CHF and HLD Well-controlled On Metformin  500 mg BID and Jardiance  10 mg QD Considering her comorbidities, had started Ozempic , but did not tolerate - DC Ozempic  On statin Has an Ophthalmologist in [place], but prefers to see Dr Ambrosio Junker now, referral provided

## 2023-09-26 NOTE — Assessment & Plan Note (Signed)
 BP Readings from Last 1 Encounters:  09/26/23 135/82   Well-controlled with Entresto  and Metoprolol  Counseled for compliance with the medications Advised DASH diet and moderate exercise/walking, at least 150 mins/week

## 2023-09-26 NOTE — Assessment & Plan Note (Signed)
 Currently in sinus rhythm On Toprol and Eliquis Follows up with Cardiologist

## 2023-09-26 NOTE — Assessment & Plan Note (Signed)
 Has bilateral thumb pain Followed by orthopedic surgeon, had steroid injection

## 2023-09-26 NOTE — Assessment & Plan Note (Addendum)
 Chronic low back pain MRI of lumbar spine showed multilevel facet arthropathy Has been evaluated by orthopedic surgery and PM&R -had epidural injection with some relief in 2023 Weight and activities could be contributing Referred for PT Increased dose of gabapentin  to 300 mg nightly for neuropathic symptoms Would avoid oral NSAIDs due to history of GERD and her current anticoagulation Lidocaine  patch prescribed, as she has responded well to it

## 2023-09-27 ENCOUNTER — Other Ambulatory Visit (HOSPITAL_COMMUNITY): Payer: Self-pay

## 2023-09-27 ENCOUNTER — Encounter (HOSPITAL_COMMUNITY): Payer: Self-pay | Admitting: Hematology

## 2023-09-27 ENCOUNTER — Telehealth: Payer: Self-pay | Admitting: Pharmacy Technician

## 2023-09-27 NOTE — Telephone Encounter (Signed)
 Pharmacy Patient Advocate Encounter   Received notification from CoverMyMeds that prior authorization for Lidocaine  5% patches is required/requested.   Insurance verification completed.   The patient is insured through [facility] .   Per test claim: PA required; PA submitted to above mentioned insurance via CoverMyMeds Key/confirmation #/EOC [id number] Status is pending

## 2023-09-27 NOTE — Telephone Encounter (Signed)
 Pharmacy Patient Advocate Encounter  Received notification from [facility] that Prior Authorization for Lidocaine  5% patches has been APPROVED from 09/27/2023 to 05/01/2024. Unable to obtain price due to refill too soon rejection, last fill date 09/27/2023 next available fill date 10/20/2023   PA #/Case ID/Reference #: [id number]

## 2023-09-28 ENCOUNTER — Other Ambulatory Visit: Payer: Self-pay | Admitting: Cardiology

## 2023-09-28 LAB — BAYER DCA HB A1C WAIVED: HB A1C (BAYER DCA - WAIVED): 6 % — ABNORMAL HIGH (ref 4.8–5.6)

## 2023-10-19 ENCOUNTER — Other Ambulatory Visit: Payer: Self-pay | Admitting: Student

## 2023-10-19 NOTE — Telephone Encounter (Signed)
 Prescription refill request for Eliquis  received. Indication: PAF Last office visit: Strader, 08/02/2023 Scr: 1.03, 08/18/2023 Age: 68 yo  Weight: 117.9 kg   Refill sent.

## 2023-10-21 ENCOUNTER — Other Ambulatory Visit: Payer: Self-pay | Admitting: Internal Medicine

## 2023-10-21 DIAGNOSIS — M5416 Radiculopathy, lumbar region: Secondary | ICD-10-CM

## 2023-11-10 ENCOUNTER — Other Ambulatory Visit: Payer: Self-pay | Admitting: Internal Medicine

## 2023-11-10 DIAGNOSIS — J4520 Mild intermittent asthma, uncomplicated: Secondary | ICD-10-CM

## 2023-11-12 ENCOUNTER — Other Ambulatory Visit: Payer: Self-pay | Admitting: Cardiology

## 2023-11-13 ENCOUNTER — Inpatient Hospital Stay (HOSPITAL_COMMUNITY): Admission: RE | Admit: 2023-11-13 | Source: Ambulatory Visit

## 2023-11-19 ENCOUNTER — Other Ambulatory Visit: Payer: Self-pay | Admitting: Internal Medicine

## 2023-11-19 DIAGNOSIS — M5416 Radiculopathy, lumbar region: Secondary | ICD-10-CM

## 2023-11-22 ENCOUNTER — Other Ambulatory Visit: Payer: Self-pay | Admitting: Cardiology

## 2023-11-22 ENCOUNTER — Ambulatory Visit (HOSPITAL_COMMUNITY)
Admission: RE | Admit: 2023-11-22 | Discharge: 2023-11-22 | Disposition: A | Discharge status: Home / Self Care | Source: Ambulatory Visit | Attending: Internal Medicine | Admitting: Internal Medicine

## 2023-11-22 ENCOUNTER — Other Ambulatory Visit: Payer: Self-pay | Admitting: Student

## 2023-11-22 ENCOUNTER — Other Ambulatory Visit: Payer: Self-pay | Admitting: Internal Medicine

## 2023-11-22 DIAGNOSIS — Z0000 Encounter for general adult medical examination without abnormal findings: Secondary | ICD-10-CM

## 2023-11-22 DIAGNOSIS — M5416 Radiculopathy, lumbar region: Secondary | ICD-10-CM

## 2023-11-22 DIAGNOSIS — Z1231 Encounter for screening mammogram for malignant neoplasm of breast: Secondary | ICD-10-CM | POA: Insufficient documentation

## 2023-11-22 NOTE — Telephone Encounter (Signed)
 Prescription refill request for Eliquis  received. Indication: PAF Last office visit: 08/02/23  B Strader PA-C Scr: 1.03 on 08/18/23  Epic Age: 68 Weight: 117.9kg  Based on above findings Eliquis  5mg  twice daily is the appropriate dose.  Refill approved.

## 2023-11-28 ENCOUNTER — Telehealth: Payer: Self-pay | Admitting: Neurology

## 2023-11-28 NOTE — Telephone Encounter (Signed)
 Sent information for Medicare extra help program.

## 2023-12-04 ENCOUNTER — Other Ambulatory Visit: Payer: Self-pay | Admitting: Internal Medicine

## 2023-12-04 DIAGNOSIS — J4520 Mild intermittent asthma, uncomplicated: Secondary | ICD-10-CM

## 2023-12-06 ENCOUNTER — Telehealth: Payer: Self-pay | Admitting: Pharmacist

## 2023-12-06 NOTE — Telephone Encounter (Signed)
 Pharmacy Patient Advocate Encounter   Received notification from Patient Pharmacy that prior authorization for Ubrelvy  50MG  tablets is required/requested.   Insurance verification completed.   The patient is insured through [facility] .   Per test claim: PA required; PA submitted to above mentioned insurance via CoverMyMeds Key/confirmation #/EOC [id number] Status is pending

## 2023-12-07 NOTE — Telephone Encounter (Signed)
 Pharmacy Patient Advocate Encounter  Received notification from [employer] that Prior Authorization for Ubrelvy  50MG  tablets has been APPROVED from 12/06/2023 to 05/01/2024   PA #/Case ID/Reference #: [id number]

## 2023-12-13 ENCOUNTER — Other Ambulatory Visit: Payer: Self-pay | Admitting: Neurology

## 2023-12-13 ENCOUNTER — Other Ambulatory Visit: Payer: Self-pay

## 2023-12-13 NOTE — Telephone Encounter (Signed)
 Last botox  note stated:    BOTOX  PROCEDURE NOTE FOR MIGRAINE HEADACHE   HISTORY: Monique Gomez is here for Botox .  Last was 06/14/2023 with me. Did great these last 3 months! No migraines.  Back on Emgality , hasn't had it since Feb, has missed the last 2 months. Approved for social security assistance. She stopped Topamax .       Refusing refill

## 2023-12-21 ENCOUNTER — Ambulatory Visit: Admitting: Neurology

## 2023-12-21 VITALS — BP 136/78 | Ht 62.0 in | Wt 260.0 lb

## 2023-12-21 DIAGNOSIS — G43709 Chronic migraine without aura, not intractable, without status migrainosus: Secondary | ICD-10-CM

## 2023-12-21 MED ORDER — ONABOTULINUMTOXINA 100 UNITS IJ SOLR
155.0000 [IU] | Freq: Once | INTRAMUSCULAR | Status: AC
  Administered 2023-12-21: 155 [IU] via INTRAMUSCULAR

## 2023-12-21 NOTE — Progress Notes (Signed)
 Botox - 200 units x 1 vial Lot: [id number] Expiration: 03/2026 NDC: 9976-6078-97  Bacteriostatic 0.9% Sodium Chloride - 4 mL  Lot: [id number] Expiration: 03/01/2025 NDC: 9590-8033-97  Dx: H56.290 B/B Witnessed by Lauraine Born, NP

## 2023-12-21 NOTE — Progress Notes (Signed)
   BOTOX  PROCEDURE NOTE FOR MIGRAINE HEADACHE  HISTORY: Monique Gomez is here for Botox . Last was 09/06/23 with me. She is a little overdue for Botox  due to finances. In the interim, her headaches have returned. When she is on Botox  she has no migraines! Had to do an Emgality  shot this time, she was previously able to stop it. Uses Ubrelvy  as needed.   Description of procedure:  The patient was placed in a sitting position. The standard protocol was used for Botox  as follows, with 5 units of Botox  injected at each site:   -Procerus muscle, midline injection  -Corrugator muscle, bilateral injection  -Frontalis muscle, bilateral injection, with 2 sites each side, medial injection was performed in the upper one third of the frontalis muscle, in the region vertical from the medial inferior edge of the superior orbital rim. The lateral injection was again in the upper one third of the forehead vertically above the lateral limbus of the cornea, 1.5 cm lateral to the medial injection site.  -Temporalis muscle injection, 4 sites, bilaterally. The first injection was 3 cm above the tragus of the ear, second injection site was 1.5 cm to 3 cm up from the first injection site in line with the tragus of the ear. The third injection site was 1.5-3 cm forward between the first 2 injection sites. The fourth injection site was 1.5 cm posterior to the second injection site.  -Occipitalis muscle injection, 3 sites, bilaterally. The first injection was done one half way between the occipital protuberance and the tip of the mastoid process behind the ear. The second injection site was done lateral and superior to the first, 1 fingerbreadth from the first injection. The third injection site was 1 fingerbreadth superiorly and medially from the first injection site.  -Cervical paraspinal muscle injection, 2 sites, bilateral, the first injection site was 1 cm from the midline of the cervical spine, 3 cm inferior to the  lower border of the occipital protuberance. The second injection site was 1.5 cm superiorly and laterally to the first injection site.  -Trapezius muscle injection was performed at 3 sites, bilaterally. The first injection site was in the upper trapezius muscle halfway between the inflection point of the neck, and the acromion. The second injection site was one half way between the acromion and the first injection site. The third injection was done between the first injection site and the inflection point of the neck.   A 200 unit bottle of Botox  was used, 155 units were injected, the rest of the Botox  was wasted. The patient tolerated the procedure well, there were no complications of the above procedure.  Botox  NDC 9976-6078-97 Lot number [id number] Expiration date 03/2026 BB

## 2023-12-22 ENCOUNTER — Encounter: Payer: Self-pay | Admitting: Radiology

## 2023-12-23 ENCOUNTER — Other Ambulatory Visit: Payer: Self-pay | Admitting: Internal Medicine

## 2023-12-23 DIAGNOSIS — M5416 Radiculopathy, lumbar region: Secondary | ICD-10-CM

## 2024-01-01 ENCOUNTER — Other Ambulatory Visit: Payer: Self-pay | Admitting: Internal Medicine

## 2024-01-01 DIAGNOSIS — J4520 Mild intermittent asthma, uncomplicated: Secondary | ICD-10-CM

## 2024-01-07 ENCOUNTER — Ambulatory Visit
Admission: EM | Admit: 2024-01-07 | Discharge: 2024-01-07 | Disposition: A | Discharge status: Home / Self Care | Attending: Nurse Practitioner | Admitting: Nurse Practitioner

## 2024-01-07 DIAGNOSIS — J0140 Acute pansinusitis, unspecified: Secondary | ICD-10-CM | POA: Diagnosis not present

## 2024-01-07 MED ORDER — AMOXICILLIN-POT CLAVULANATE 875-125 MG PO TABS: 1.0000 | ORAL_TABLET | Freq: Two times a day (BID) | ORAL | 0 refills | Status: DC

## 2024-01-07 MED ORDER — DEXAMETHASONE SODIUM PHOSPHATE 10 MG/ML IJ SOLN
10.0000 mg | Freq: Once | INTRAMUSCULAR | Status: AC
  Administered 2024-01-07: 10 mg via INTRAMUSCULAR

## 2024-01-07 MED ORDER — PREDNISONE 20 MG PO TABS
40.0000 mg | ORAL_TABLET | Freq: Every day | ORAL | 0 refills | Status: AC
Start: 2024-01-07 — End: 2024-01-12

## 2024-01-07 MED ORDER — CHLORPHEN-PE-ACETAMINOPHEN 4-10-325 MG PO TABS: 1.0000 | ORAL_TABLET | Freq: Three times a day (TID) | ORAL | 0 refills | Status: AC

## 2024-01-07 NOTE — ED Triage Notes (Signed)
 Pt reports swelling in the face and pain in the temples, has tried nasal spray but it started burning in the nose. Ongoing x 2 weeks.

## 2024-01-07 NOTE — ED Provider Notes (Signed)
 [facility]    CSN: [id number] Arrival date & time: 01/07/24  1211      History   Chief Complaint No chief complaint on file.   HPI Monique Gomez is a 68 y.o. female.   Discussed the use of AI scribe software for clinical note transcription with the patient, who gave verbal consent to proceed.     Past Medical History:  Diagnosis Date   Abdominal fibromatosis    Allergic rhinitis    Anemia    Asthma    Essential hypertension    GERD (gastroesophageal reflux disease)    History of cardiac catheterization    No significant CAD May 2015   History of migraine headaches    Hyperlipidemia    Iron  deficiency anemia 02/05/2021   Myocarditis (HCC)    a. 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   Acquired thrombophilia (HCC) 11/10/2021   Encounter for general adult medical examination with abnormal findings 05/13/2021   Acute pansinusitis 05/13/2021   Iron  deficiency anemia 02/05/2021   Arthritis of carpometacarpal (CMC) joint of both thumbs 02/04/2021   Lumbar radiculopathy 01/11/2021   Myocarditis (HCC)    Vitamin D  deficiency 07/18/2019   Empty sella syndrome (HCC) 10/24/2018   Hemorrhoids 11/25/2014   Obstructive sleep apnea 10/14/2013   PAF (paroxysmal atrial fibrillation) (HCC) 09/06/2013   NSTEMI (non-ST elevated myocardial infarction) (HCC) 09/03/2013   MDD (major depressive disorder), single episode 02/04/2013   Anxiety state 02/04/2013   Type 2 diabetes mellitus with other specified complication (HCC) 06/06/2012   Severe carpal tunnel syndrome of both wrists 12/27/2011   De Quervain's disease (tenosynovitis) 10/09/2011   GERD (gastroesophageal reflux disease) 02/27/2011   Allergic rhinitis  08/29/2010   Anemia 02/15/2010   Hyperlipidemia 12/23/2009   Morbid obesity (HCC) 12/23/2009   Migraine 12/23/2009   HTN (hypertension) 12/23/2009    Past Surgical History:  Procedure Laterality Date   CATARACT EXTRACTION     CATARACT EXTRACTION W/PHACO Right 04/05/2019   Procedure: CATARACT EXTRACTION PHACO AND INTRAOCULAR LENS PLACEMENT RIGHT EYE (CDE: 3.19);  Surgeon: Harrie Agent, MD;  Location: [facility];  Service: Ophthalmology;  Laterality: Right;   COLONOSCOPY N/A 02/02/2015   SLF: 1. one colon polyp removed-no source for anemia identified. 2. moderate diverticulosis noted in the sigmoid colon and descending colon 3. the left colon is redundant 4. Rectal bleeding due ot small internal hemorroids 5. Moderate sized external hemorrhoids.   ESOPHAGOGASTRODUODENOSCOPY N/A 02/02/2015   SLF: 1. 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 large hiatal hernia 3. mild non-erosive gastritis 4. No source for anemia identified.    LEFT HEART CATH AND CORONARY ANGIOGRAPHY N/A 05/14/2020   Procedure: LEFT HEART CATH AND CORONARY ANGIOGRAPHY;  Surgeon: Claudene Victory ORN, MD;  Location: [facility];  Service: Cardiovascular;  Laterality: N/A;   LEFT HEART CATHETERIZATION WITH CORONARY ANGIOGRAM N/A 09/03/2013   Procedure: LEFT HEART CATHETERIZATION WITH CORONARY ANGIOGRAM;  Surgeon: Candyce GORMAN Reek, MD;  Location: [facility] CATH LAB;  Service: Cardiovascular;  Laterality: N/A;   Right carpal tunnel release      OB History     Gravida  4   Para  2   Term  Preterm      AB      Living         SAB      IAB      Ectopic      Multiple      Live Births               Home Medications    Prior to Admission medications   Medication Sig Start Date End Date Taking? Authorizing Provider  amoxicillin -clavulanate (AUGMENTIN ) 875-125 MG tablet Take 1 tablet by mouth 2 (two) times daily after a meal. 01/07/24  Yes Iola Lukes, FNP  Chlorphen-PE-Acetaminophen  4-10-325 MG  TABS Take 1 tablet by mouth in the morning, at noon, and at bedtime. 01/07/24  Yes Brie Eppard, FNP  predniSONE  (DELTASONE ) 20 MG tablet Take 2 tablets (40 mg total) by mouth daily for 5 days. 01/07/24 01/12/24 Yes Iola Lukes, FNP  acetaminophen  (TYLENOL ) 500 MG tablet Take 1 tablet (500 mg total) by mouth every 6 (six) hours as needed. 01/11/20   Avegno, Komlanvi S, FNP  albuterol  (VENTOLIN  HFA) 108 (90 Base) MCG/ACT inhaler INHALE 2 PUFFS BY MOUTH EVERY 6 HOURS AS NEEDED FOR WHEEZING OR SHORTNESS OF BREATH 01/02/24   Tobie Suzzane POUR, MD  apixaban  (ELIQUIS ) 5 MG TABS tablet TAKE 1 TABLET BY MOUTH TWICE  DAILY 11/22/23   Debera Jayson MATSU, MD  atorvastatin  (LIPITOR ) 80 MG tablet Take 1 tablet (80 mg total) by mouth daily. 07/13/23   Tobie Suzzane POUR, MD  blood glucose meter kit and supplies KIT Dispense based on patient and insurance preference. Use up to four times daily as directed. 05/16/22   Tobie Suzzane POUR, MD  BOTOX  200 units injection PROVIDER TO INJECT 155 UNITS INTO THE MUSCLES OF THE HEAD AND NECK EVERY 3 MONTHS. DISCARD REMAINDER 05/29/23   Gayland Lauraine PARAS, NP  conjugated estrogens  (PREMARIN ) vaginal cream Apply 0.5 gm which is a pea-sized amount with the applicator or fingertip vaginally at bedtime for 2 weeks and then 2-3 x weekly. 05/05/22   Watt Rush, MD  cyclobenzaprine  (FLEXERIL ) 10 MG tablet One tablet every twelve hours as needed for spasm. 03/29/22   Brenna Lin, MD  empagliflozin  (JARDIANCE ) 10 MG TABS tablet TAKE 1 TABLET BY MOUTH DAILY  BEFORE BREAKFAST 11/23/23   Alvan Dorn FALCON, MD  esomeprazole  (NEXIUM ) 40 MG capsule TAKE 1 CAPSULE BY MOUTH DAILY 09/11/23   Patel, Rutwik K, MD  fluticasone  (FLONASE ) 50 MCG/ACT nasal spray Place 2 sprays into both nostrils daily. 05/13/21   Tobie Suzzane POUR, MD  gabapentin  (NEURONTIN ) 300 MG capsule TAKE 1 CAPSULE BY MOUTH AT  BEDTIME 11/22/23   Tobie Suzzane POUR, MD  Galcanezumab -gnlm (EMGALITY ) 120 MG/ML SOAJ INJECT 120 MG  SUBCUTANEOUSLY ONCE  EVERY MONTH 07/26/23   Gayland Lauraine PARAS, NP  ipratropium (ATROVENT ) 0.03 % nasal spray Place 2 sprays into both nostrils every 12 (twelve) hours. 01/23/23   Tobie Suzzane POUR, MD  Iron -FA-B Cmp-C-Biot-Probiotic (FUSION PLUS) CAPS Take 1 capsule by mouth daily. 08/23/23   Lamon Pleasant HERO, PA-C  lidocaine  (LIDODERM ) 5 % USE 1 PATCH EXTERNALLY ONCE DAILY. REMOVE AND DISCARD PATCH WITHIN 12 HOURS OR AS DIRECTED 12/25/23   Tobie Suzzane POUR, MD  meclizine  (ANTIVERT ) 25 MG tablet Take 1 tablet (25 mg total) by mouth 3 (three) times daily as needed for dizziness. 01/11/21   Tobie Suzzane POUR, MD  metFORMIN  (GLUCOPHAGE ) 500 MG tablet Take 1 tablet (500 mg total) by mouth 2 (two) times daily with a  meal. 09/26/23   Tobie Suzzane POUR, MD  metoCLOPramide  (REGLAN ) 10 MG tablet Take 1 tablet (10 mg total) by mouth every 8 (eight) hours as needed for up to 7 days for nausea (Headache). 10/13/18   Cleotilde Rogue, MD  metoprolol  succinate (TOPROL -XL) 50 MG 24 hr tablet TAKE 1 TABLET BY MOUTH IN THE MORNING AND 1 AT BEDTIME WITH OR IMMEDIATELY FOLLOWING A MEAL. 11/14/23   Debera Jayson MATSU, MD  sacubitril -valsartan  (ENTRESTO ) 49-51 MG Take 1 tablet by mouth 2 (two) times daily. 08/02/23   Strader, Brittany M, PA-C  sacubitril -valsartan  (ENTRESTO ) 97-103 MG TAKE 1 TABLET BY MOUTH TWICE  DAILY 09/28/23   Alvan Dorn FALCON, MD  Ubrogepant  (UBRELVY ) 50 MG TABS TAKE ONE TABLET BY MOUTH AT THE ONSET OF A MIGRAINE. CAN REPEAT IN 2 HOURS IF NEEDED 07/13/23   Gayland Lauraine PARAS, NP    Family History Family History  Problem Relation Age of Onset   Diabetes Mother    Hypertension Mother    Heart disease Mother    High Cholesterol Mother    Alzheimer's disease Father    Hypertension Sister    Hypertension Brother    Diabetes Brother    Arthritis Sister    Hypertension Sister    High Cholesterol Sister    Diabetes Son    Developmental delay Son    Colon cancer Neg Hx     Social History Social History   Tobacco Use   Smoking  status: Former    Current packs/day: 0.00    Types: Cigarettes    Quit date: 04/01/2012    Years since quitting: 11.7   Smokeless tobacco: Never  Vaping Use   Vaping status: Never Used  Substance Use Topics   Alcohol use: Not Currently    Alcohol/week: 0.0 standard drinks of alcohol    Comment: very occasional   Drug use: No     Allergies   Venlafaxine    Review of Systems Review of Systems  Constitutional:  Negative for chills and fever.  HENT:  Positive for congestion, facial swelling, sinus pressure and sinus pain. Negative for rhinorrhea, sneezing and sore throat.   Eyes:        Eyes are puffy   Musculoskeletal:  Negative for myalgias.  Neurological:  Positive for headaches.  All other systems reviewed and are negative.    Physical Exam Triage Vital Signs ED Triage Vitals  Encounter Vitals Group     BP 01/07/24 1257 (!) 141/98     Girls Systolic BP Percentile --      Girls Diastolic BP Percentile --      Boys Systolic BP Percentile --      Boys Diastolic BP Percentile --      Pulse Rate 01/07/24 1257 96     Resp 01/07/24 1257 18     Temp 01/07/24 1257 98.3 F (36.8 C)     Temp Source 01/07/24 1257 Oral     SpO2 01/07/24 1257 95 %     Weight --      Height --      Head Circumference --      Peak Flow --      Pain Score 01/07/24 1301 8     Pain Loc --      Pain Education --      Exclude from Growth Chart --    No data found.  Updated Vital Signs BP (!) 141/98 (BP Location: Right Arm)   Pulse 96   Temp 98.3  F (36.8 C) (Oral)   Resp 18   SpO2 95%   Visual Acuity Right Eye Distance:   Left Eye Distance:   Bilateral Distance:    Right Eye Near:   Left Eye Near:    Bilateral Near:     Physical Exam Vitals reviewed.  Constitutional:      General: She is awake. She is not in acute distress.    Appearance: Normal appearance. She is well-developed. She is not ill-appearing, toxic-appearing or diaphoretic.  HENT:     Head: Normocephalic.      Right Ear: Hearing, tympanic membrane, ear canal and external ear normal. No drainage, swelling or tenderness. No middle ear effusion. Tympanic membrane is not erythematous.     Left Ear: Hearing, tympanic membrane, ear canal and external ear normal. No drainage, swelling or tenderness.  No middle ear effusion. Tympanic membrane is not erythematous.     Nose: Mucosal edema and congestion present. No rhinorrhea.     Right Turbinates: Swollen.     Left Turbinates: Swollen.     Right Sinus: Maxillary sinus tenderness and frontal sinus tenderness present.     Left Sinus: Maxillary sinus tenderness and frontal sinus tenderness present.     Mouth/Throat:     Lips: Pink.     Mouth: Mucous membranes are moist.     Pharynx: Oropharynx is clear. Uvula midline. No pharyngeal swelling, oropharyngeal exudate, posterior oropharyngeal erythema or uvula swelling.     Tonsils: No tonsillar exudate or tonsillar abscesses.  Eyes:     General: Vision grossly intact.     Conjunctiva/sclera: Conjunctivae normal.  Cardiovascular:     Rate and Rhythm: Normal rate and regular rhythm.     Heart sounds: Normal heart sounds.  Pulmonary:     Effort: Pulmonary effort is normal.     Breath sounds: Normal breath sounds and air entry.  Musculoskeletal:        General: Normal range of motion.     Cervical back: Full passive range of motion without pain, normal range of motion and neck supple.  Lymphadenopathy:     Cervical: No cervical adenopathy.  Skin:    General: Skin is warm and dry.  Neurological:     General: No focal deficit present.     Mental Status: She is alert and oriented to person, place, and time.  Psychiatric:        Mood and Affect: Mood normal.        Behavior: Behavior normal. Behavior is cooperative.      UC Treatments / Results  Labs (all labs ordered are listed, but only abnormal results are displayed) Labs Reviewed - No data to display  EKG   Radiology No results  found.  Procedures Procedures (including critical care time)  Medications Ordered in UC Medications  dexamethasone  (DECADRON ) injection 10 mg (has no administration in time range)    Initial Impression / Assessment and Plan / UC Course  I have reviewed the triage vital signs and the nursing notes.  Pertinent labs & imaging results that were available during my care of the patient were reviewed by me and considered in my medical decision making (see chart for details).     Patient presents with diffuse sinus pressure, facial pain, congestion, headache, and nasal drainage consistent with pansinusitis, involving inflammation of all paranasal sinuses. Symptoms and duration suggest a bacterial etiology rather than viral illness. Treatment was initiated with antibiotics, Flonase  nasal spray, Norel AD for congestion, and a short  course of prednisone  to reduce inflammation. Supportive care with increased fluid intake, rest, saline nasal spray or rinses, humidifier use, and warm compresses over the sinuses was recommended. Patient was advised to follow up with PCP if symptoms do not improve within 5-7 days and to seek emergency care for worsening headache, high fever, swelling or redness around the eyes, vision changes, confusion, or difficulty breathing.  Today's evaluation has revealed no signs of a dangerous process. Discussed diagnosis with patient and/or guardian. Patient and/or guardian aware of their diagnosis, possible red flag symptoms to watch out for and need for close follow up. Patient and/or guardian understands verbal and written discharge instructions. Patient and/or guardian comfortable with plan and disposition.  Patient and/or guardian has a clear mental status at this time, good insight into illness (after discussion and teaching) and has clear judgment to make decisions regarding their care  Documentation was completed with the aid of voice recognition software. Transcription may  contain typographical errors. Final Clinical Impressions(s) / UC Diagnoses   Final diagnoses:  Acute pansinusitis, recurrence not specified     Discharge Instructions      You have sinus infection. A sinus infection, also called sinusitis, is inflammation of your sinuses. Sinusitis can be due to bacteria, viruses or allergies. Your infection is likely due to bacteria which requires antibiotics. Take all medications as prescribed. Make sure you finish all the prescribed medications even if you start to feel better. You may also take tylenol  as needed for pain and/or fever. Use your Flonase  daily. Drink enough fluid to keep your urine pale yellow. Staying hydrated will also help to thin your mucus. Use a cool mist humidifier to keep the humidity level in your home above 50%. Inhale steam for 10-15 minutes, 3-4 times a day. You can do this in the bathroom while a hot shower is running and/or purchase over-the-counter vapor shower tablets which is great to help with nasal congestion.Try to limit your exposure to cool or dry air. Sleep with your head raised to decrease post-nasal drainage. Make sure you get enough sleep each night. Once you have finished your medication and your symptoms have improved, remember to replace your toothbrush to help prevent re-infection. If your symptoms do not improved after completing medications, please follow-up with your healthcare provider.      ED Prescriptions     Medication Sig Dispense Auth. Provider   amoxicillin -clavulanate (AUGMENTIN ) 875-125 MG tablet Take 1 tablet by mouth 2 (two) times daily after a meal. 14 tablet Keandra Medero, Lucie, FNP   predniSONE  (DELTASONE ) 20 MG tablet Take 2 tablets (40 mg total) by mouth daily for 5 days. 10 tablet Iola Lucie, FNP   Chlorphen-PE-Acetaminophen  4-10-325 MG TABS Take 1 tablet by mouth in the morning, at noon, and at bedtime. 21 tablet Iola Lucie, FNP      PDMP not reviewed this encounter.    Iola Lucie, [place] 01/07/24 1410

## 2024-01-07 NOTE — Discharge Instructions (Addendum)
 You have sinus infection. A sinus infection, also called sinusitis, is inflammation of your sinuses. Sinusitis can be due to bacteria, viruses or allergies. Your infection is likely due to bacteria which requires antibiotics. Take all medications as prescribed. Make sure you finish all the prescribed medications even if you start to feel better. You may also take tylenol  as needed for pain and/or fever. Use your Flonase  daily. Drink enough fluid to keep your urine pale yellow. Staying hydrated will also help to thin your mucus. Use a cool mist humidifier to keep the humidity level in your home above 50%. Inhale steam for 10-15 minutes, 3-4 times a day. You can do this in the bathroom while a hot shower is running and/or purchase over-the-counter vapor shower tablets which is great to help with nasal congestion.Try to limit your exposure to cool or dry air. Sleep with your head raised to decrease post-nasal drainage. Make sure you get enough sleep each night. Once you have finished your medication and your symptoms have improved, remember to replace your toothbrush to help prevent re-infection. If your symptoms do not improved after completing medications, please follow-up with your healthcare provider.

## 2024-01-15 ENCOUNTER — Telehealth: Payer: Self-pay | Admitting: Physical Medicine and Rehabilitation

## 2024-01-15 ENCOUNTER — Telehealth: Payer: Self-pay | Admitting: Neurology

## 2024-01-15 NOTE — Telephone Encounter (Signed)
 Botox  was 8/21. Continue to monitor. I am not clear that the soreness would still be from Botox . If any swelling or anything visible ask her to send a picture. Tylenol  as needed is fine and try some ice. I hope she feels better.

## 2024-01-15 NOTE — Telephone Encounter (Signed)
 Returned call to pt who stated that since she had the injection its been sore around the whole tope of head. Her last injection was 12/21/23. Pt says its just sore and tender. Pt stated just taken tylenol  for it and that's all she has been doing to alleviate the pain. Pt stated that its constant pain that varies in intensity but will go away with taking tylenol 

## 2024-01-15 NOTE — Telephone Encounter (Signed)
 Since last Botox  pt now has soreness on top of head, would like a call to discuss.

## 2024-01-15 NOTE — Telephone Encounter (Signed)
Patient called. She would like an appointment with Dr. Newton.  

## 2024-01-24 ENCOUNTER — Ambulatory Visit (INDEPENDENT_AMBULATORY_CARE_PROVIDER_SITE_OTHER): Admitting: Otolaryngology

## 2024-01-26 ENCOUNTER — Other Ambulatory Visit: Payer: Self-pay | Admitting: Internal Medicine

## 2024-01-26 DIAGNOSIS — J4520 Mild intermittent asthma, uncomplicated: Secondary | ICD-10-CM

## 2024-01-27 ENCOUNTER — Other Ambulatory Visit: Payer: Self-pay | Admitting: Internal Medicine

## 2024-01-27 DIAGNOSIS — M5416 Radiculopathy, lumbar region: Secondary | ICD-10-CM

## 2024-01-29 ENCOUNTER — Ambulatory Visit: Admitting: Internal Medicine

## 2024-01-30 ENCOUNTER — Ambulatory Visit: Admitting: Cardiology

## 2024-01-30 ENCOUNTER — Ambulatory Visit: Admitting: Physical Medicine and Rehabilitation

## 2024-02-13 ENCOUNTER — Telehealth: Payer: Self-pay | Admitting: Neurology

## 2024-02-13 MED ORDER — PREDNISONE 5 MG PO TABS: ORAL_TABLET | ORAL | 0 refills | Status: DC

## 2024-02-13 NOTE — Telephone Encounter (Signed)
 Pt called wanting an appt soon due to her headache not going away.

## 2024-02-13 NOTE — Telephone Encounter (Signed)
 Called and spoke to pt and she stated that she takes the emgality , gabapentin  and ubrelvy  but nothing seems to be working. Pt also stated that she takes tylenol  2 500mg  this afternoon and that is not helping either. I advised that I would reach out to Sarah. She actually has appt tomorrow w/sarah. Could this be a trigger point injection tomorrow?

## 2024-02-13 NOTE — Telephone Encounter (Signed)
 I called the patient, has reported return of migraine headache.  Has had constant headaches since her last Botox  injection.  Today, having migraine headache, has tried Tylenol , 2 Ubrelvy  without benefit.  I will send in prednisone  taper, wants to come tomorrow to discuss migraine management.  She is no longer able to continue Botox  due to high cost.  She remains on Emgality .  Meds ordered this encounter  Medications   predniSONE  (DELTASONE ) 5 MG tablet    Sig: Start taking 6 tablets taper by 1 tablet daily until off    Dispense:  21 tablet    Refill:  0

## 2024-02-13 NOTE — Addendum Note (Signed)
 Addended by: GAYLAND LAURAINE PARAS on: 02/13/2024 04:49 PM   Modules accepted: Orders

## 2024-02-14 ENCOUNTER — Telehealth: Payer: Self-pay | Admitting: Pharmacist

## 2024-02-14 ENCOUNTER — Other Ambulatory Visit (HOSPITAL_COMMUNITY): Payer: Self-pay

## 2024-02-14 ENCOUNTER — Telehealth: Payer: Self-pay | Admitting: Internal Medicine

## 2024-02-14 ENCOUNTER — Encounter: Payer: Self-pay | Admitting: Neurology

## 2024-02-14 ENCOUNTER — Ambulatory Visit: Admitting: Neurology

## 2024-02-14 VITALS — BP 125/89 | HR 102 | Ht 62.0 in | Wt 257.0 lb

## 2024-02-14 DIAGNOSIS — G43709 Chronic migraine without aura, not intractable, without status migrainosus: Secondary | ICD-10-CM | POA: Diagnosis not present

## 2024-02-14 MED ORDER — QULIPTA 60 MG PO TABS: 60.0000 mg | ORAL_TABLET | Freq: Every day | ORAL | 11 refills | Status: AC

## 2024-02-14 NOTE — Telephone Encounter (Signed)
 Pharmacy Patient Advocate Encounter   Received notification from Patient Pharmacy that prior authorization for Qulipta 60MG  tablets is required/requested.   Insurance verification completed.   The patient is insured through [facility].   Per test claim: PA required; PA submitted to above mentioned insurance via Latent Key/confirmation #/EOC [employer] Status is pending

## 2024-02-14 NOTE — Patient Instructions (Signed)
 Start Qulipta 60 mg daily for migraine prevention  Stop Emgality   Try Nurtec, 1 tablet at onset of migraine, in place of Ubrelvy , same mechanism of action Take 1 tablet at onset of headache, max is 1 tablet in 24 hours.

## 2024-02-14 NOTE — Telephone Encounter (Signed)
 Patient needs a follow up for DM in office with Dr Tobie within the next month.

## 2024-02-14 NOTE — Telephone Encounter (Signed)
 Pharmacy Patient Advocate Encounter  Received notification from [facility] that Prior Authorization for QULIPTA 60 MG PO TABS has been APPROVED from 02/14/2024 to 05/01/2025   PA #/Case ID/Reference #: [id number]

## 2024-02-14 NOTE — Progress Notes (Signed)
 Patient: Monique Gomez Date of Birth: [birth year]  Reason for Visit: Follow up History from: Patient Primary Neurologist: Dr. Onita   ASSESSMENT AND PLAN 68 y.o. year old female   1.  Chronic migraine headache - Worsening migraine headaches, no longer able to afford Botox , Finish 5 mg prednisone  taper pack  - Stop Emgality , switch to Qulipta 60 mg daily for migraine prevention - Gave samples of Nurtec to try in place of Ubrelvy , if better benefit we will send in a prescription - Next steps: May resume Botox  if finances allow, try Vyepti, Ajovy - Previously tried and failed: Emgality , Botox , Aimovig , Effexor , Imitrex , Maxalt , Fioricet, diclofenac , metoprolol , Ubrelvy   - MRI of the brain 09/15/21 showed no acute abnormality, has been evaluated by ophthalmology, ENT - Follow up with PCP about CPAP, is older machine, likely needs to be retested - Follow up in 6 months with me  HISTORY OF PRESENT ILLNESS: Today 02/14/24 02/14/24 SS: Here today to discuss migraines. Has been getting Botox . Last was 12/21/23 with me. Can no longer continue Botox  due to cost. Having more migraines. On Emgality , takes 1st of the month, consistently. I sent her prednisone  taper pack yesterday. Taking tylenol  and ubrelvy  without benefit. Feeling much better since starting prednisone  yesterday. With this last cycle of Botox , has not helped, tenderness to top of head. Over the last few weeks, migraines returning with increasing severity. Yesterday bad migraine, in the bed, with migraine features, pain to top of head, in temples, reminded her of how bad migraines used to be. Under a lot of stress, is not able to work, financial stressors.   11/10/21 SS: Monique Gomez Clos here today for follow-up.  MRI of the brain in May 2023 showed no acute abnormality, it did show mild chronic small vessel ischemic changes, partially empty sella turcica. In the ER 11/02/21 for headache, she was given IV cocktail of IV fluids, magnesium   sulfate, Compazine , Toradol , Benadryl , Decadron  with resolution of headache.  CT head showed no acute abnormality. Has seen ENT, told inflamed nasal passages. On Flonase . Was told in ER, Flonase  causes rebound headaches, when she stopped had swelling of her face. Headaches top of her head, temples. Ubrelvy  helps, but has to use sparingly. Tylenol  helps but not as good as Ubrelvy . On Emgality , has done 3 injections so far, not much change. Cannot identify any triggers for migraines, doesn't have any stress, she is retired. Can have dizziness, nausea, vomiting, sometimes sensitive to light/sound. This is her typical migraine pattern.   HISTORY  08/25/21 SS: Here today for follow-up, she couldn't get the virtual visit to work x 3. Sooner appointment. Reports for months, pressure around her eyes, dizziness felt related to sinuses. Sinuses were swollen hard to assess, took oral steroids x 7 days, things cleared. Using nasal spray and Zyrtec . Reports tingling around lips, feels pressure behind eyes. Not having migraine headaches. Reports pain to occipital area, crown, is like pounding. Is now retired. 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 Takes Ubrelvy  with good benefit or Tylenol . She thought all of this was related to sinus issues, but ENT assured her not. Told to come see neurologist. Migraines are typically severe pain, sensitive to light, nauseated. Current headaches are sporadic, few times a week, lasts until Ubrelvy  or Tylenol . No exacerbating factors. Feel better when lying down. Denies numbness or weakness, no trouble with gait.   REVIEW OF SYSTEMS: Out of a complete 14 system review of symptoms, the patient  complains only of the following symptoms, and all other reviewed systems are negative.  See HPI  ALLERGIES: Allergies  Allergen Reactions   Venlafaxine  Nausea And Vomiting and Other (See Comments)    Dizziness, shakiness    HOME MEDICATIONS: Outpatient  Medications Prior to Visit  Medication Sig Dispense Refill   acetaminophen  (TYLENOL ) 500 MG tablet Take 1 tablet (500 mg total) by mouth every 6 (six) hours as needed. 30 tablet 0   albuterol  (VENTOLIN  HFA) 108 (90 Base) MCG/ACT inhaler INHALE 2 PUFFS BY MOUTH EVERY 6 HOURS AS NEEDED FOR WHEEZING FOR SHORTNESS OF BREATH 18 g 0   amoxicillin -clavulanate (AUGMENTIN ) 875-125 MG tablet Take 1 tablet by mouth 2 (two) times daily after a meal. 14 tablet 0   apixaban  (ELIQUIS ) 5 MG TABS tablet TAKE 1 TABLET BY MOUTH TWICE  DAILY 200 tablet 1   atorvastatin  (LIPITOR ) 80 MG tablet Take 1 tablet (80 mg total) by mouth daily. 90 tablet 3   blood glucose meter kit and supplies KIT Dispense based on patient and insurance preference. Use up to four times daily as directed. 1 each 0   BOTOX  200 units injection PROVIDER TO INJECT 155 UNITS INTO THE MUSCLES OF THE HEAD AND NECK EVERY 3 MONTHS. DISCARD REMAINDER 1 each 2   Chlorphen-PE-Acetaminophen  4-10-325 MG TABS Take 1 tablet by mouth in the morning, at noon, and at bedtime. 21 tablet 0   conjugated estrogens  (PREMARIN ) vaginal cream Apply 0.5 gm which is a pea-sized amount with the applicator or fingertip vaginally at bedtime for 2 weeks and then 2-3 x weekly. 42.5 g 12   cyclobenzaprine  (FLEXERIL ) 10 MG tablet One tablet every twelve hours as needed for spasm. 60 tablet 2   empagliflozin  (JARDIANCE ) 10 MG TABS tablet TAKE 1 TABLET BY MOUTH DAILY  BEFORE BREAKFAST 90 tablet 2   esomeprazole  (NEXIUM ) 40 MG capsule TAKE 1 CAPSULE BY MOUTH DAILY 30 capsule 11   fluticasone  (FLONASE ) 50 MCG/ACT nasal spray Place 2 sprays into both nostrils daily. 16 g 5   gabapentin  (NEURONTIN ) 300 MG capsule TAKE 1 CAPSULE BY MOUTH AT  BEDTIME 100 capsule 2   ipratropium (ATROVENT ) 0.03 % nasal spray Place 2 sprays into both nostrils every 12 (twelve) hours. 30 mL 3   Iron -FA-B Cmp-C-Biot-Probiotic (FUSION PLUS) CAPS Take 1 capsule by mouth daily. 30 capsule 11   lidocaine   (LIDODERM ) 5 % PLACE 1 PATCH EXTERNALLY ONCE DAILY. REMOVE AND DISCARD PATCH WITHIN 12 HOURS OR AS DIRECTED. 30 patch 0   meclizine  (ANTIVERT ) 25 MG tablet Take 1 tablet (25 mg total) by mouth 3 (three) times daily as needed for dizziness. 30 tablet 0   metFORMIN  (GLUCOPHAGE ) 500 MG tablet Take 1 tablet (500 mg total) by mouth 2 (two) times daily with a meal. 180 tablet 3   metoCLOPramide  (REGLAN ) 10 MG tablet Take 1 tablet (10 mg total) by mouth every 8 (eight) hours as needed for up to 7 days for nausea (Headache). 21 tablet 0   metoprolol  succinate (TOPROL -XL) 50 MG 24 hr tablet TAKE 1 TABLET BY MOUTH IN THE MORNING AND 1 AT BEDTIME WITH OR IMMEDIATELY FOLLOWING A MEAL. 180 tablet 2   predniSONE  (DELTASONE ) 5 MG tablet Start taking 6 tablets taper by 1 tablet daily until off 21 tablet 0   sacubitril -valsartan  (ENTRESTO ) 49-51 MG Take 1 tablet by mouth 2 (two) times daily. 56 tablet 0   sacubitril -valsartan  (ENTRESTO ) 97-103 MG TAKE 1 TABLET BY MOUTH TWICE  DAILY 180 tablet 3  Ubrogepant  (UBRELVY ) 50 MG TABS TAKE ONE TABLET BY MOUTH AT THE ONSET OF A MIGRAINE. CAN REPEAT IN 2 HOURS IF NEEDED 10 tablet 11   Galcanezumab -gnlm (EMGALITY ) 120 MG/ML SOAJ INJECT 120 MG  SUBCUTANEOUSLY ONCE EVERY MONTH 1 mL 11   No facility-administered medications prior to visit.    PAST MEDICAL HISTORY: Past Medical History:  Diagnosis Date   Abdominal fibromatosis    Allergic rhinitis    Anemia    Asthma    Essential hypertension    GERD (gastroesophageal reflux disease)    History of cardiac catheterization    No significant CAD May 2015   History of migraine headaches    Hyperlipidemia    Iron  deficiency anemia 02/05/2021   Myocarditis (HCC)    a. diagnosed in 05/2020 with cath showing normal cors   Obesity    PAF (paroxysmal atrial fibrillation) (HCC) 08/2013   Sleep apnea    Type 2 diabetes mellitus (HCC)     PAST SURGICAL HISTORY: Past Surgical History:  Procedure Laterality Date   CATARACT  EXTRACTION     CATARACT EXTRACTION W/PHACO Right 04/05/2019   Procedure: CATARACT EXTRACTION PHACO AND INTRAOCULAR LENS PLACEMENT RIGHT EYE (CDE: 3.19);  Surgeon: Harrie Agent, MD;  Location: [facility];  Service: Ophthalmology;  Laterality: Right;   COLONOSCOPY N/A 02/02/2015   SLF: 1. one colon polyp removed-no source for anemia identified. 2. moderate diverticulosis noted in the sigmoid colon and descending colon 3. the left colon is redundant 4. Rectal bleeding due ot small internal hemorroids 5. Moderate sized external hemorrhoids.   ESOPHAGOGASTRODUODENOSCOPY N/A 02/02/2015   SLF: 1. 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 large hiatal hernia 3. mild non-erosive gastritis 4. No source for anemia identified.    LEFT HEART CATH AND CORONARY ANGIOGRAPHY N/A 05/14/2020   Procedure: LEFT HEART CATH AND CORONARY ANGIOGRAPHY;  Surgeon: Claudene Victory ORN, MD;  Location: [facility];  Service: Cardiovascular;  Laterality: N/A;   LEFT HEART CATHETERIZATION WITH CORONARY ANGIOGRAM N/A 09/03/2013   Procedure: LEFT HEART CATHETERIZATION WITH CORONARY ANGIOGRAM;  Surgeon: Candyce GORMAN Reek, MD;  Location: [facility] CATH LAB;  Service: Cardiovascular;  Laterality: N/A;   Right carpal tunnel release      FAMILY HISTORY: Family History  Problem Relation Age of Onset   Diabetes Mother    Hypertension Mother    Heart disease Mother    High Cholesterol Mother    Alzheimer's disease Father    Hypertension Sister    Hypertension Brother    Diabetes Brother    Arthritis Sister    Hypertension Sister    High Cholesterol Sister    Diabetes Son    Developmental delay Son    Colon cancer Neg Hx     SOCIAL HISTORY: Social History   Socioeconomic History   Marital status: Single    Spouse name: Not on file   Number of children: 2   Years of education: Not on file   Highest education level: Not on file  Occupational History   Occupation: Child psychotherapist    Employer: [employer]  Tobacco  Use   Smoking status: Former    Current packs/day: 0.00    Types: Cigarettes    Quit date: 04/01/2012    Years since quitting: 11.8   Smokeless tobacco: Never  Vaping Use   Vaping status: Never Used  Substance and Sexual Activity   Alcohol use: Not Currently    Alcohol/week: 0.0 standard drinks of alcohol  Comment: very occasional   Drug use: No   Sexual activity: Not Currently    Birth control/protection: Post-menopausal  Other Topics Concern   Not on file  Social History Narrative   Not on file   Social Drivers of Health   Financial Resource Strain: Low Risk  (01/23/2023)   Overall Financial Resource Strain (CARDIA)    Difficulty of Paying Living Expenses: Not hard at all  Food Insecurity: No Food Insecurity (01/23/2023)   Hunger Vital Sign    Worried About Running Out of Food in the Last Year: Never true    Ran Out of Food in the Last Year: Never true  Transportation Needs: No Transportation Needs (01/23/2023)   PRAPARE - Administrator, Civil Service (Medical): No    Lack of Transportation (Non-Medical): No  Physical Activity: Insufficiently Active (01/23/2023)   Exercise Vital Sign    Days of Exercise per Week: 7 days    Minutes of Exercise per Session: 20 min  Stress: No Stress Concern Present (01/23/2023)   [employer] of Occupational Health - Occupational Stress Questionnaire    Feeling of Stress : Not at all  Social Connections: Socially Integrated (01/23/2023)   Social Connection and Isolation Panel    Frequency of Communication with Friends and Family: More than three times a week    Frequency of Social Gatherings with Friends and Family: More than three times a week    Attends Religious Services: More than 4 times per year    Active Member of [employer] or Organizations: Yes    Attends Banker Meetings: More than 4 times per year    Marital Status: Married  Catering manager Violence: Not At Risk (01/23/2023)   Humiliation, Afraid, Rape,  and Kick questionnaire    Fear of Current or Ex-Partner: No    Emotionally Abused: No    Physically Abused: No    Sexually Abused: No   PHYSICAL EXAM  Vitals:   02/14/24 1248  BP: 125/89  Pulse: (!) 102  Weight: 257 lb (116.6 kg)  Height: 5' 2 (1.575 m)    Body mass index is 47.01 kg/m.  Generalized: Well developed, in no acute distress  Neurological examination  Mentation: Alert oriented to time, place, history taking. Follows all commands speech and language fluent Cranial nerve II-XII: Pupils were equal round reactive to light. Extraocular movements were full, visual field were full on confrontational test. Facial sensation and strength were normal. Head turning and shoulder shrug  were normal and symmetric. Motor: The motor testing reveals 5 over 5 strength of all 4 extremities. Good symmetric motor tone is noted throughout.  Sensory: Sensory testing is intact to soft touch on all 4 extremities. No evidence of extinction is noted.  Coordination: Cerebellar testing reveals good finger-nose-finger and heel-to-shin bilaterally.  Gait and station: Gait is normal.   DIAGNOSTIC DATA (LABS, IMAGING, TESTING) - I reviewed patient records, labs, notes, testing and imaging myself where available.  Lab Results  Component Value Date   WBC 8.6 08/18/2023   HGB 12.6 08/18/2023   HCT 42.3 08/18/2023   MCV 72.3 (L) 08/18/2023   PLT 381 08/18/2023      Component Value Date/Time   NA 140 08/18/2023 1005   NA 143 05/26/2023 0956   K 4.2 08/18/2023 1005   CL 105 08/18/2023 1005   CO2 28 08/18/2023 1005   GLUCOSE 124 (H) 08/18/2023 1005   BUN 17 08/18/2023 1005   BUN 13 05/26/2023 0956  CREATININE 1.03 (H) 08/18/2023 1005   CREATININE 0.84 12/18/2019 0822   CALCIUM  9.8 08/18/2023 1005   PROT 7.9 08/18/2023 1005   PROT 7.0 05/26/2023 0956   ALBUMIN 3.6 08/18/2023 1005   ALBUMIN 4.0 05/26/2023 0956   AST 16 08/18/2023 1005   ALT 12 08/18/2023 1005   ALKPHOS 84 08/18/2023 1005    BILITOT 0.5 08/18/2023 1005   BILITOT <0.2 05/26/2023 0956   GFRNONAA 60 (L) 08/18/2023 1005   GFRNONAA 83 10/23/2017 0811   GFRAA >60 10/15/2019 0850   GFRAA 97 10/23/2017 0811   Lab Results  Component Value Date   CHOL 165 05/26/2023   HDL 39 (L) 05/26/2023   LDLCALC 109 (H) 05/26/2023   TRIG 88 05/26/2023   CHOLHDL 4.2 05/26/2023   Lab Results  Component Value Date   HGBA1C 6.0 (H) 09/26/2023   Lab Results  Component Value Date   VITAMINB12 401 05/15/2020   Lab Results  Component Value Date   TSH 2.480 05/26/2023    Lauraine Born, AGNP-C, DNP 02/14/2024, 1:37 PM [facility] [address] [place], [place] [address] [phone]

## 2024-02-20 ENCOUNTER — Ambulatory Visit (INDEPENDENT_AMBULATORY_CARE_PROVIDER_SITE_OTHER): Admitting: Otolaryngology

## 2024-02-20 ENCOUNTER — Encounter (INDEPENDENT_AMBULATORY_CARE_PROVIDER_SITE_OTHER): Payer: Self-pay | Admitting: Otolaryngology

## 2024-02-20 VITALS — BP 121/85 | HR 80 | Temp 98.2°F | Ht 62.0 in | Wt 263.0 lb

## 2024-02-20 DIAGNOSIS — J310 Chronic rhinitis: Secondary | ICD-10-CM

## 2024-02-20 DIAGNOSIS — J324 Chronic pansinusitis: Secondary | ICD-10-CM

## 2024-02-20 DIAGNOSIS — J343 Hypertrophy of nasal turbinates: Secondary | ICD-10-CM | POA: Diagnosis not present

## 2024-02-20 DIAGNOSIS — R0981 Nasal congestion: Secondary | ICD-10-CM | POA: Diagnosis not present

## 2024-02-20 DIAGNOSIS — J342 Deviated nasal septum: Secondary | ICD-10-CM

## 2024-02-20 DIAGNOSIS — R519 Headache, unspecified: Secondary | ICD-10-CM

## 2024-02-20 NOTE — Progress Notes (Unsigned)
 Patient ID: Monique Gomez, female   DOB: [birth year], 68 y.o.   MRN: [id number]  Follow-up: Chronic nasal congestion, recurrent sinusitis  HPI: The patient is a 68 year old female who returns today complaining of more recurrent sinusitis.  The patient was last seen in 2023.  At that time, she was noted to have nasal mucosal congestion, mild septal deviation, and bilateral inferior turbinate hypertrophy.  She was treated with Flonase  nasal spray and Zyrtec .  According to the patient, he has been experiencing more recurrent sinusitis over the past year.  In September 2025, she noted significant increase in her nasal congestion, with facial swelling and facial pain.  She was treated with antibiotic and steroid.  She also complains of frequent headaches.  Exam: General: Communicates without difficulty, well nourished, no acute distress. Head: Normocephalic, no evidence injury, no tenderness, facial buttresses intact without stepoff. Face/sinus: No tenderness to palpation and percussion. Facial movement is normal and symmetric. Eyes: PERRL, EOMI. No scleral icterus, conjunctivae clear. Neuro: CN II exam reveals vision grossly intact.  No nystagmus at any point of gaze. Ears: Auricles well formed without lesions.  Ear canals are intact without mass or lesion.  No erythema or edema is appreciated.  The TMs are intact without fluid. Nose: External evaluation reveals normal support and skin without lesions.  Dorsum is intact.  Anterior rhinoscopy reveals congested mucosa over anterior aspect of inferior turbinates and intact septum.  No purulence noted. Oral:  Oral cavity and oropharynx are intact, symmetric, without erythema or edema.  Mucosa is moist without lesions. Neck: Full range of motion without pain.  There is no significant lymphadenopathy.  No masses palpable.  Thyroid  bed within normal limits to palpation.  Parotid glands and submandibular glands equal bilaterally without mass.  Trachea is midline.  Neuro:  CN 2-12 grossly intact.   Assessment: 1.  Chronic rhinitis with nasal mucosal congestion, mild nasal septal deviation, and bilateral inferior turbinate hypertrophy. 2.  Recently diagnosed recurrent sinusitis.  No purulent drainage is noted today. 3.  The possible differential diagnoses of her facial pain and headache include chronic sinusitis versus neurogenic causes.  Plan: 1.  The physical exam findings are reviewed with the patient. 2.  Continue with Flonase  nasal spray and Zyrtec  to treat her nasal congestion. 3.  Sinus CT scan to evaluate so possible chronic sinusitis. 4.  The patient will return for reevaluation in 4 weeks.

## 2024-02-21 ENCOUNTER — Inpatient Hospital Stay

## 2024-02-21 DIAGNOSIS — J324 Chronic pansinusitis: Secondary | ICD-10-CM | POA: Insufficient documentation

## 2024-02-21 DIAGNOSIS — J342 Deviated nasal septum: Secondary | ICD-10-CM | POA: Insufficient documentation

## 2024-02-21 DIAGNOSIS — J343 Hypertrophy of nasal turbinates: Secondary | ICD-10-CM | POA: Insufficient documentation

## 2024-02-22 ENCOUNTER — Inpatient Hospital Stay: Attending: Physician Assistant

## 2024-02-22 DIAGNOSIS — D509 Iron deficiency anemia, unspecified: Secondary | ICD-10-CM | POA: Insufficient documentation

## 2024-02-22 DIAGNOSIS — R718 Other abnormality of red blood cells: Secondary | ICD-10-CM

## 2024-02-22 DIAGNOSIS — D508 Other iron deficiency anemias: Secondary | ICD-10-CM

## 2024-02-22 DIAGNOSIS — R590 Localized enlarged lymph nodes: Secondary | ICD-10-CM | POA: Diagnosis not present

## 2024-02-22 LAB — CBC WITH DIFFERENTIAL/PLATELET
Abs Immature Granulocytes: 0.03 K/uL (ref 0.00–0.07)
Basophils Absolute: 0 K/uL (ref 0.0–0.1)
Basophils Relative: 0 %
Eosinophils Absolute: 0.2 K/uL (ref 0.0–0.5)
Eosinophils Relative: 2 %
HCT: 42.8 % (ref 36.0–46.0)
Hemoglobin: 12.7 g/dL (ref 12.0–15.0)
Immature Granulocytes: 0 %
Lymphocytes Relative: 24 %
Lymphs Abs: 2.7 K/uL (ref 0.7–4.0)
MCH: 21.1 pg — ABNORMAL LOW (ref 26.0–34.0)
MCHC: 29.7 g/dL — ABNORMAL LOW (ref 30.0–36.0)
MCV: 71.2 fL — ABNORMAL LOW (ref 80.0–100.0)
Monocytes Absolute: 0.8 K/uL (ref 0.1–1.0)
Monocytes Relative: 7 %
Neutro Abs: 7.3 K/uL (ref 1.7–7.7)
Neutrophils Relative %: 67 %
Platelets: 377 K/uL (ref 150–400)
RBC: 6.01 MIL/uL — ABNORMAL HIGH (ref 3.87–5.11)
RDW: 18.3 % — ABNORMAL HIGH (ref 11.5–15.5)
WBC: 10.9 K/uL — ABNORMAL HIGH (ref 4.0–10.5)
nRBC: 0 % (ref 0.0–0.2)

## 2024-02-22 LAB — IRON AND TIBC
Iron: 76 ug/dL (ref 28–170)
Saturation Ratios: 21 % (ref 10.4–31.8)
TIBC: 357 ug/dL (ref 250–450)
UIBC: 281 ug/dL

## 2024-02-22 LAB — FERRITIN: Ferritin: 49 ng/mL (ref 11–307)

## 2024-02-22 NOTE — Progress Notes (Deleted)
 [facility] [address], [place] [address]   CLINIC:  Medical Oncology/Hematology  PCP:  Monique Suzzane POUR, MD [address] [place] [address] [phone]   REASON FOR VISIT:  Follow-up for lymphadenopathy and iron  deficiency anemia   PRIOR THERAPY: None   CURRENT THERAPY: Intermittent IV iron , iron  tablet daily  INTERVAL HISTORY:   [name] 68 y.o. female returns for routine follow-up of lymphadenopathy and iron  deficiency anemia.   She was last seen by Monique Gomez on 08/23/2023.  At today's visit, she reports feeling ***. ***No recent hospitalizations, surgeries, or changes in baseline health status. ***She has 100***% energy and 100***% appetite.  ***Her weight has been stable.   She continues to take iron  tablet most days, occasionally skips it due to diarrhea.  ***She also eats an iron  rich diet.    ***She denies any gross hematochezia, melena, or epistaxis. ***She reports that her energy is good. ***Her migraine headaches have resolved after starting Botox  injections. ***Denies any restless legs, pica, dyspnea on exertion, chest pain, lightheadedness, or syncope.   She is having some recurrent sinus issues due to seasonal allergies, but denies current lymphadenopathy.   *** ***She reports that she continues to see Dr. Karis due to chronic inflammation and swelling of her nasal passages related to narrow nasal passages. ***he denies any recent infections, fever, chills, night sweats, or unintentional weight loss.   ASSESSMENT & PLAN:  1.  Subcentimeter left supraclavicular adenopathy, waxing and waning - She reported lymphadenopathy since ~September 2020 - Ultrasound soft tissue neck on 01/25/2019 shows left supraclavicular lymph node measuring 1 cm.  A CT of the soft tissue of the neck on the same day showed 3 small supraclavicular lymph nodes, largest measuring 8-9 mm.  Small posterior triangle lymph node, none more than 9 mm.   Consistent with mild reactive adenitis. - CT soft tissue neck with contrast on 02/22/2019 showed right level 2 lymph node measuring 6 mm unchanged.  Subcentimeter posterior triangle lymph nodes on the right unchanged.  Largest measures 8 mm.  Left level 2 lymph node measures 8.6 mm unchanged.  Cluster of supraclavicular lymph nodes on the left also slightly smaller.  Largest measures 5 mm.  No oropharyngeal masses. - CT soft tissue neck on 06/17/2019 shows left level 2 lymph node measuring 1.2 cm, previously 1.1 cm.  Stable cluster of nonenlarged left supraclavicular lymph nodes. - CT angiogram from 05/13/2020 which showed no lymphadenopathy in the base of the neck.  No thoracic adenopathy.  Diffuse normal-sized mediastinal and axillary lymph nodes noted. - Former smoker.  She smoked 1 pack of cigarettes per week for 35 years.  She quit smoking in 2013. - She sees Dr. Karis for chronic sinusitis*** - No recent infections or antibiotics*** - She does not report any fevers, night sweats or weight loss in the last 6 months.   *** - Physical examination today(***) did not reveal any palpable adenopathy in the neck region or axillary region.    - Most recent CBC (02/22/2024): Mildly elevated WBC 10.9, with normal differential  - Suspect intermittent reactive lymphadenitis/lymphadenopathy, likely related to flareups of her chronic sinusitis*** - PLAN: No further work-up indicated at this time.  We will reassess with physical exam and follow-up visit in *** 1 year ***, or sooner if needed based on new symptoms.***  2.  Iron  deficiency anemia, with chronic microcytosis: - She reports taking iron  tablet daily for many years.   -  EGD/colonoscopy (02/02/2015): Nonerosive gastritis, no source for anemia identified on EGD.  Polyp and diverticulosis, rectal bleeding from small internal hemorrhoids. - No major bleeding episodes such as epistaxis, hematemesis, hematochezia, or melena*** - Most recent IV iron  with  Venofer  1000 mg divided doses in December 2023/January 2024 - Most recent labs (02/22/2024): Hgb 12.7/MCV 71.2 with elevated RBC 6.01.  Ferritin improving at 49, iron  saturation 21%.   - Longstanding history of microcytic erythrocytosis, regardless of hemoglobin levels.  Hemoglobin fractionation cascade was negative, but this does not rule out alpha thalassemia trait.   - PLAN: Although iron  levels are low, she is asymptomatic and would prefer to defer any IV iron  for the time being. *** She will contact us  sooner if she has any new fatigue or lethargy prior to her next appointment. - Continue oral iron  supplementation*** - Repeat CBC & iron  panel and follow-up in 1 year *** - We discussed suspected alpha thalassemia trait, but patient DECLINES genetic testing, as it would be unlikely to change overall management.***    PLAN SUMMARY:*** >> Labs in 1 year = CBC/D, ferritin, iron /TIBC >> OFFICE visit after labs     REVIEW OF SYSTEMS: ***  Review of Systems  Constitutional:  Negative for appetite change, chills, diaphoresis, fatigue (mild), fever and unexpected weight change.  HENT:   Negative for lump/mass and nosebleeds.        Sinus congestion and seasonal allergies  Eyes:  Negative for eye problems.  Respiratory:  Negative for cough, hemoptysis and shortness of breath.   Cardiovascular:  Negative for chest pain, leg swelling and palpitations.  Gastrointestinal:  Negative for abdominal pain, blood in stool, constipation, diarrhea, nausea and vomiting.  Genitourinary:  Negative for hematuria.   Musculoskeletal:  Positive for back pain.  Skin: Negative.   Neurological:  Negative for dizziness, headaches and light-headedness.  Hematological:  Does not bruise/bleed easily.     PHYSICAL EXAM:***  ECOG PERFORMANCE STATUS: 1 - Symptomatic but completely ambulatory  There were no vitals filed for this visit.  There were no vitals filed for this visit.  Physical Exam Constitutional:       Appearance: Normal appearance. She is morbidly obese.  Cardiovascular:     Heart sounds: Normal heart sounds.  Pulmonary:     Breath sounds: Normal breath sounds.  Lymphadenopathy:     Head:     Right side of head: No submental, submandibular, tonsillar, preauricular, posterior auricular or occipital adenopathy.     Left side of head: No submental, submandibular, tonsillar, preauricular, posterior auricular or occipital adenopathy.     Cervical: No cervical adenopathy.     Right cervical: No superficial, deep or posterior cervical adenopathy.    Left cervical: No superficial, deep or posterior cervical adenopathy.     Upper Body:     Right upper body: No supraclavicular, axillary, pectoral or epitrochlear adenopathy.     Left upper body: No supraclavicular, axillary, pectoral or epitrochlear adenopathy.  Neurological:     General: No focal deficit present.     Mental Status: Mental status is at baseline.  Psychiatric:        Behavior: Behavior normal. Behavior is cooperative.     PAST MEDICAL/SURGICAL HISTORY:  Past Medical History:  Diagnosis Date   Abdominal fibromatosis    Allergic rhinitis    Anemia    Asthma    Essential hypertension    GERD (gastroesophageal reflux disease)    History of cardiac catheterization    No significant CAD  May 2015   History of migraine headaches    Hyperlipidemia    Iron  deficiency anemia 02/05/2021   Myocarditis (HCC)    a. diagnosed in 05/2020 with cath showing normal cors   Obesity    PAF (paroxysmal atrial fibrillation) (HCC) 08/2013   Sleep apnea    Type 2 diabetes mellitus [facility])    Past Surgical History:  Procedure Laterality Date   CATARACT EXTRACTION     CATARACT EXTRACTION W/PHACO Right 04/05/2019   Procedure: CATARACT EXTRACTION PHACO AND INTRAOCULAR LENS PLACEMENT RIGHT EYE (CDE: 3.19);  Surgeon: Harrie Agent, MD;  Location: [facility];  Service: Ophthalmology;  Laterality: Right;   COLONOSCOPY N/A 02/02/2015   SLF: 1. one  colon polyp removed-no source for anemia identified. 2. moderate diverticulosis noted in the sigmoid colon and descending colon 3. the left colon is redundant 4. Rectal bleeding due ot small internal hemorroids 5. Moderate sized external hemorrhoids.   ESOPHAGOGASTRODUODENOSCOPY N/A 02/02/2015   SLF: 1. 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 large hiatal hernia 3. mild non-erosive gastritis 4. No source for anemia identified.    LEFT HEART CATH AND CORONARY ANGIOGRAPHY N/A 05/14/2020   Procedure: LEFT HEART CATH AND CORONARY ANGIOGRAPHY;  Surgeon: Claudene Victory ORN, MD;  Location: [facility] INVASIVE CV LAB;  Service: Cardiovascular;  Laterality: N/A;   LEFT HEART CATHETERIZATION WITH CORONARY ANGIOGRAM N/A 09/03/2013   Procedure: LEFT HEART CATHETERIZATION WITH CORONARY ANGIOGRAM;  Surgeon: Candyce GORMAN Reek, MD;  Location: [facility] CATH LAB;  Service: Cardiovascular;  Laterality: N/A;   Right carpal tunnel release      SOCIAL HISTORY:  Social History   Socioeconomic History   Marital status: Single    Spouse name: Not on file   Number of children: 2   Years of education: Not on file   Highest education level: Not on file  Occupational History   Occupation: Child psychotherapist    Employer: [employer]  Tobacco Use   Smoking status: Former    Current packs/day: 0.00    Types: Cigarettes    Quit date: 04/01/2012    Years since quitting: 11.9   Smokeless tobacco: Never  Vaping Use   Vaping status: Never Used  Substance and Sexual Activity   Alcohol use: Not Currently    Alcohol/week: 0.0 standard drinks of alcohol    Comment: very occasional   Drug use: No   Sexual activity: Not Currently    Birth control/protection: Post-menopausal  Other Topics Concern   Not on file  Social History Narrative   Not on file   Social Drivers of Health   Financial Resource Strain: Low Risk  (01/23/2023)   Overall Financial Resource Strain (CARDIA)    Difficulty of Paying Living Expenses: Not hard  at all  Food Insecurity: No Food Insecurity (01/23/2023)   Hunger Vital Sign    Worried About Running Out of Food in the Last Year: Never true    Ran Out of Food in the Last Year: Never true  Transportation Needs: No Transportation Needs (01/23/2023)   PRAPARE - Administrator, Civil Service (Medical): No    Lack of Transportation (Non-Medical): No  Physical Activity: Insufficiently Active (01/23/2023)   Exercise Vital Sign    Days of Exercise per Week: 7 days    Minutes of Exercise per Session: 20 min  Stress: No Stress Concern Present (01/23/2023)   [employer] of Occupational Health - Occupational Stress Questionnaire    Feeling of Stress : Not at  all  Social Connections: Socially Integrated (01/23/2023)   Social Connection and Isolation Panel    Frequency of Communication with Friends and Family: More than three times a week    Frequency of Social Gatherings with Friends and Family: More than three times a week    Attends Religious Services: More than 4 times per year    Active Member of [employer] or Organizations: Yes    Attends Engineer, structural: More than 4 times per year    Marital Status: Married  Catering manager Violence: Not At Risk (01/23/2023)   Humiliation, Afraid, Rape, and Kick questionnaire    Fear of Current or Ex-Partner: No    Emotionally Abused: No    Physically Abused: No    Sexually Abused: No    FAMILY HISTORY:  Family History  Problem Relation Age of Onset   Diabetes Mother    Hypertension Mother    Heart disease Mother    High Cholesterol Mother    Alzheimer's disease Father    Hypertension Sister    Hypertension Brother    Diabetes Brother    Arthritis Sister    Hypertension Sister    High Cholesterol Sister    Diabetes Son    Developmental delay Son    Colon cancer Neg Hx     CURRENT MEDICATIONS:  Outpatient Encounter Medications as of 02/26/2024  Medication Sig   acetaminophen  (TYLENOL ) 500 MG tablet Take 1 tablet  (500 mg total) by mouth every 6 (six) hours as needed.   albuterol  (VENTOLIN  HFA) 108 (90 Base) MCG/ACT inhaler INHALE 2 PUFFS BY MOUTH EVERY 6 HOURS AS NEEDED FOR WHEEZING FOR SHORTNESS OF BREATH   amoxicillin -clavulanate (AUGMENTIN ) 875-125 MG tablet Take 1 tablet by mouth 2 (two) times daily after a meal.   apixaban  (ELIQUIS ) 5 MG TABS tablet TAKE 1 TABLET BY MOUTH TWICE  DAILY   Atogepant (QULIPTA) 60 MG TABS Take 1 tablet (60 mg total) by mouth daily.   atorvastatin  (LIPITOR ) 80 MG tablet Take 1 tablet (80 mg total) by mouth daily.   blood glucose meter kit and supplies KIT Dispense based on patient and insurance preference. Use up to four times daily as directed.   BOTOX  200 units injection PROVIDER TO INJECT 155 UNITS INTO THE MUSCLES OF THE HEAD AND NECK EVERY 3 MONTHS. DISCARD REMAINDER   Chlorphen-PE-Acetaminophen  4-10-325 MG TABS Take 1 tablet by mouth in the morning, at noon, and at bedtime.   conjugated estrogens  (PREMARIN ) vaginal cream Apply 0.5 gm which is a pea-sized amount with the applicator or fingertip vaginally at bedtime for 2 weeks and then 2-3 x weekly.   cyclobenzaprine  (FLEXERIL ) 10 MG tablet One tablet every twelve hours as needed for spasm.   empagliflozin  (JARDIANCE ) 10 MG TABS tablet TAKE 1 TABLET BY MOUTH DAILY  BEFORE BREAKFAST   esomeprazole  (NEXIUM ) 40 MG capsule TAKE 1 CAPSULE BY MOUTH DAILY   fluticasone  (FLONASE ) 50 MCG/ACT nasal spray Place 2 sprays into both nostrils daily.   gabapentin  (NEURONTIN ) 300 MG capsule TAKE 1 CAPSULE BY MOUTH AT  BEDTIME   ipratropium (ATROVENT ) 0.03 % nasal spray Place 2 sprays into both nostrils every 12 (twelve) hours.   Iron -FA-B Cmp-C-Biot-Probiotic (FUSION PLUS) CAPS Take 1 capsule by mouth daily.   lidocaine  (LIDODERM ) 5 % PLACE 1 PATCH EXTERNALLY ONCE DAILY. REMOVE AND DISCARD PATCH WITHIN 12 HOURS OR AS DIRECTED.   meclizine  (ANTIVERT ) 25 MG tablet Take 1 tablet (25 mg total) by mouth 3 (three) times daily as  needed for  dizziness.   metFORMIN  (GLUCOPHAGE ) 500 MG tablet Take 1 tablet (500 mg total) by mouth 2 (two) times daily with a meal.   metoCLOPramide  (REGLAN ) 10 MG tablet Take 1 tablet (10 mg total) by mouth every 8 (eight) hours as needed for up to 7 days for nausea (Headache).   metoprolol  succinate (TOPROL -XL) 50 MG 24 hr tablet TAKE 1 TABLET BY MOUTH IN THE MORNING AND 1 AT BEDTIME WITH OR IMMEDIATELY FOLLOWING A MEAL.   predniSONE  (DELTASONE ) 5 MG tablet Start taking 6 tablets taper by 1 tablet daily until off   sacubitril -valsartan  (ENTRESTO ) 49-51 MG Take 1 tablet by mouth 2 (two) times daily.   sacubitril -valsartan  (ENTRESTO ) 97-103 MG TAKE 1 TABLET BY MOUTH TWICE  DAILY   Ubrogepant  (UBRELVY ) 50 MG TABS TAKE ONE TABLET BY MOUTH AT THE ONSET OF A MIGRAINE. CAN REPEAT IN 2 HOURS IF NEEDED   No facility-administered encounter medications on file as of 02/26/2024.    ALLERGIES:  Allergies  Allergen Reactions   Venlafaxine  Nausea And Vomiting and Other (See Comments)    Dizziness, shakiness    LABORATORY DATA:  I have reviewed the labs as listed.  CBC    Component Value Date/Time   WBC 10.9 (H) 02/22/2024 0937   RBC 6.01 (H) 02/22/2024 0937   HGB 12.7 02/22/2024 0937   HGB 13.0 05/26/2023 0956   HCT 42.8 02/22/2024 0937   HCT 44.8 05/26/2023 0956   PLT 377 02/22/2024 0937   PLT 380 05/26/2023 0956   MCV 71.2 (L) 02/22/2024 0937   MCV 74 (L) 05/26/2023 0956   MCH 21.1 (L) 02/22/2024 0937   MCHC 29.7 (L) 02/22/2024 0937   RDW 18.3 (H) 02/22/2024 0937   RDW 15.6 (H) 05/26/2023 0956   LYMPHSABS 2.7 02/22/2024 0937   LYMPHSABS 2.3 05/26/2023 0956   MONOABS 0.8 02/22/2024 0937   EOSABS 0.2 02/22/2024 0937   EOSABS 0.2 05/26/2023 0956   BASOSABS 0.0 02/22/2024 0937   BASOSABS 0.0 05/26/2023 0956      Latest Ref Rng & Units 08/18/2023   10:05 AM 05/26/2023    9:56 AM 01/23/2023    9:32 AM  CMP  Glucose 70 - 99 mg/dL 875  894  891   BUN 8 - 23 mg/dL 17  13  17    Creatinine 0.44 -  1.00 mg/dL 8.96  9.21  9.10   Sodium 135 - 145 mmol/L 140  143  145   Potassium 3.5 - 5.1 mmol/L 4.2  4.5  4.1   Chloride 98 - 111 mmol/L 105  103  107   CO2 22 - 32 mmol/L 28  25  23    Calcium  8.9 - 10.3 mg/dL 9.8  9.7  9.4   Total Protein 6.5 - 8.1 g/dL 7.9  7.0  6.9   Total Bilirubin 0.0 - 1.2 mg/dL 0.5  <9.7  0.2   Alkaline Phos 38 - 126 U/L 84  104  93   AST 15 - 41 U/L 16  17  15    ALT 0 - 44 U/L 12  14  10      DIAGNOSTIC IMAGING:  I have independently reviewed the relevant imaging and discussed with the patient.   WRAP UP:  All questions were answered. The patient knows to call the clinic with any problems, questions or concerns.  Medical decision making: Moderate***  Time spent on visit: I spent 20 minutes counseling the patient face to face. The total time spent in the  appointment was 30 minutes and more than 50% was on counseling.  Monique CHRISTELLA Barefoot, Gomez  ***

## 2024-02-24 ENCOUNTER — Other Ambulatory Visit: Payer: Self-pay | Admitting: Internal Medicine

## 2024-02-24 DIAGNOSIS — M5416 Radiculopathy, lumbar region: Secondary | ICD-10-CM

## 2024-02-26 ENCOUNTER — Inpatient Hospital Stay: Admitting: Physician Assistant

## 2024-02-28 ENCOUNTER — Ambulatory Visit: Admitting: Physician Assistant

## 2024-02-29 NOTE — Progress Notes (Signed)
 Monique Gomez                                          MRN: [id number]   02/29/2024   The [facility] Quality Team Specialist reviewed this patient medical record for the purposes of chart review for care gap closure. The following were reviewed: abstraction for care gap closure-glycemic status assessment and kidney health evaluation for diabetes:eGFR  and uACR.    [facility]BCI Quality Team

## 2024-03-04 ENCOUNTER — Encounter: Payer: Self-pay | Admitting: Radiology

## 2024-03-05 ENCOUNTER — Ambulatory Visit (INDEPENDENT_AMBULATORY_CARE_PROVIDER_SITE_OTHER): Payer: Self-pay | Admitting: Internal Medicine

## 2024-03-05 ENCOUNTER — Encounter: Payer: Self-pay | Admitting: Internal Medicine

## 2024-03-05 VITALS — BP 118/78 | HR 111 | Ht 62.0 in | Wt 257.0 lb

## 2024-03-05 DIAGNOSIS — Z7984 Long term (current) use of oral hypoglycemic drugs: Secondary | ICD-10-CM

## 2024-03-05 DIAGNOSIS — K649 Unspecified hemorrhoids: Secondary | ICD-10-CM

## 2024-03-05 DIAGNOSIS — I10 Essential (primary) hypertension: Secondary | ICD-10-CM | POA: Diagnosis not present

## 2024-03-05 DIAGNOSIS — E1169 Type 2 diabetes mellitus with other specified complication: Secondary | ICD-10-CM | POA: Diagnosis not present

## 2024-03-05 DIAGNOSIS — G43709 Chronic migraine without aura, not intractable, without status migrainosus: Secondary | ICD-10-CM

## 2024-03-05 DIAGNOSIS — G4733 Obstructive sleep apnea (adult) (pediatric): Secondary | ICD-10-CM

## 2024-03-05 DIAGNOSIS — I480 Paroxysmal atrial fibrillation: Secondary | ICD-10-CM

## 2024-03-05 DIAGNOSIS — E782 Mixed hyperlipidemia: Secondary | ICD-10-CM

## 2024-03-05 MED ORDER — HYDROCORTISONE ACETATE 25 MG RE SUPP: 25.0000 mg | Freq: Two times a day (BID) | RECTAL | 2 refills | Status: AC

## 2024-03-05 NOTE — Assessment & Plan Note (Signed)
 Lab Results  Component Value Date   HGBA1C 6.0 (H) 09/26/2023   Associated with HTN, CHF and HLD Well-controlled On Metformin  500 mg BID and Jardiance  10 mg QD Considering her comorbidities, had started Ozempic , but did not tolerate - DC Ozempic  On statin Has an Ophthalmologist in [place], but prefers to see Dr Waylan now, referral provided

## 2024-03-05 NOTE — Patient Instructions (Addendum)
 Please use Anusol cream for hemorrhoids.  Please start taking Miralax up to twice daily for constipation.  Please maintain at least 64 ounces of fluid intake in a day.  Please continue to take medications as prescribed.  Please continue to follow low carb diet and perform moderate exercise/walking at least 150 mins/week.  Please get fasting blood tests done after 2 weeks.

## 2024-03-05 NOTE — Assessment & Plan Note (Signed)
 Has rectal pain due to hemorrhoids Anusol suppository prescribed Urgent referral to GI Advised to take Miralax for constipation

## 2024-03-05 NOTE — Assessment & Plan Note (Addendum)
 On Qulipta Has started Botox  injections as well Ubrelvy  PRN Follows up with Neurology

## 2024-03-05 NOTE — Assessment & Plan Note (Signed)
 Uses CPAP regularly, continues to benefit from it Has h/o severe sleep apnea - would benefit from Zepbound, will discuss in the next visit

## 2024-03-05 NOTE — Progress Notes (Signed)
 Established Patient Office Visit  Subjective:  Patient ID: Monique Gomez, female    DOB: Sep 01, 1955  Age: 68 y.o. MRN: [id number]  CC:  Chief Complaint  Patient presents with   Diabetes    Follow up   Migraine    Pt reports sx migraines    Hemorrhoids    Pain is 10 out of 10.     HPI Monique Gomez is a 68 y.o. female with past medical history of paroxysmal atrial fibrillation, hypertension, myocarditis, OSA on CPAP, GERD, DM, HLD and morbid obesity who presents for f/u of chronic medical conditions.  BP is well-controlled. Takes medications regularly. Patient denies  chest pain or palpitations.  She takes Entresto  for history of hypertrophic cardiomegaly.  She is followed by cardiology and heart failure clinic.  She has severe rectal pain for the last 5 days due to hemorrhoids. Denies rectal bleeding. She had constipation in the last week. Last BM was 2 days ago, but reports straining while defecation. She has used Preparation H without much relief. She is unable to sit properly due to severe pain.  She has chronic nasal congestion and postnasal drip.  She has been using Zyrtec  and Flonase  for it.  Denies any fever or chills.  She has history of DM, for which she takes metformin . She is taking Jardiance  for history of myocarditis/CHF.  She denies any fatigue, polyuria or polydipsia currently.  She uses CPAP for OSA.   She is on Qulipta for migraine now, which has improved her headache now.  She also gets Botox  injections for it.  Followed by neurology.  She has chronic low back pain, for which she has seen orthopedic surgeon and PM&R.  She has had physical therapy without much relief, but agrees to try it again.  She has also had epidural injection in 11/23, but she has recurrence of low back pain.  Denies any recent injury or fall.  She has radiating symptoms to bilateral LE at times.  Denies any urinary or stool incontinence. She takes Gabapentin  300 mg qHS.  She uses  lidocaine  patch with adequate relief.    Past Medical History:  Diagnosis Date   Abdominal fibromatosis    Allergic rhinitis    Anemia    Asthma    Essential hypertension    GERD (gastroesophageal reflux disease)    History of cardiac catheterization    No significant CAD May 2015   History of migraine headaches    Hyperlipidemia    Iron  deficiency anemia 02/05/2021   Myocarditis (HCC)    a. diagnosed in 05/2020 with cath showing normal cors   Obesity    PAF (paroxysmal atrial fibrillation) (HCC) 08/2013   Sleep apnea    Type 2 diabetes mellitus [facility])     Past Surgical History:  Procedure Laterality Date   CATARACT EXTRACTION     CATARACT EXTRACTION W/PHACO Right 04/05/2019   Procedure: CATARACT EXTRACTION PHACO AND INTRAOCULAR LENS PLACEMENT RIGHT EYE (CDE: 3.19);  Surgeon: Harrie Agent, MD;  Location: [facility];  Service: Ophthalmology;  Laterality: Right;   COLONOSCOPY N/A 02/02/2015   SLF: 1. one colon polyp removed-no source for anemia identified. 2. moderate diverticulosis noted in the sigmoid colon and descending colon 3. the left colon is redundant 4. Rectal bleeding due ot small internal hemorroids 5. Moderate sized external hemorrhoids.   ESOPHAGOGASTRODUODENOSCOPY N/A 02/02/2015   SLF: 1. 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 large hiatal hernia 3. mild non-erosive gastritis 4. No source  for anemia identified.    LEFT HEART CATH AND CORONARY ANGIOGRAPHY N/A 05/14/2020   Procedure: LEFT HEART CATH AND CORONARY ANGIOGRAPHY;  Surgeon: Claudene Victory ORN, MD;  Location: [facility] INVASIVE CV LAB;  Service: Cardiovascular;  Laterality: N/A;   LEFT HEART CATHETERIZATION WITH CORONARY ANGIOGRAM N/A 09/03/2013   Procedure: LEFT HEART CATHETERIZATION WITH CORONARY ANGIOGRAM;  Surgeon: Candyce GORMAN Reek, MD;  Location: [facility] CATH LAB;  Service: Cardiovascular;  Laterality: N/A;   Right carpal tunnel release      Family History  Problem Relation Age of Onset   Diabetes Mother     Hypertension Mother    Heart disease Mother    High Cholesterol Mother    Alzheimer's disease Father    Hypertension Sister    Hypertension Brother    Diabetes Brother    Arthritis Sister    Hypertension Sister    High Cholesterol Sister    Diabetes Son    Developmental delay Son    Colon cancer Neg Hx     Social History   Socioeconomic History   Marital status: Single    Spouse name: Not on file   Number of children: 2   Years of education: Not on file   Highest education level: Not on file  Occupational History   Occupation: Child psychotherapist    Employer: [employer]  Tobacco Use   Smoking status: Former    Current packs/day: 0.00    Types: Cigarettes    Quit date: 04/01/2012    Years since quitting: 11.9   Smokeless tobacco: Never  Vaping Use   Vaping status: Never Used  Substance and Sexual Activity   Alcohol use: Not Currently    Alcohol/week: 0.0 standard drinks of alcohol    Comment: very occasional   Drug use: No   Sexual activity: Not Currently    Birth control/protection: Post-menopausal  Other Topics Concern   Not on file  Social History Narrative   Not on file   Social Drivers of Health   Financial Resource Strain: Low Risk  (01/23/2023)   Overall Financial Resource Strain (CARDIA)    Difficulty of Paying Living Expenses: Not hard at all  Food Insecurity: No Food Insecurity (01/23/2023)   Hunger Vital Sign    Worried About Running Out of Food in the Last Year: Never true    Ran Out of Food in the Last Year: Never true  Transportation Needs: No Transportation Needs (01/23/2023)   PRAPARE - Administrator, Civil Service (Medical): No    Lack of Transportation (Non-Medical): No  Physical Activity: Insufficiently Active (01/23/2023)   Exercise Vital Sign    Days of Exercise per Week: 7 days    Minutes of Exercise per Session: 20 min  Stress: No Stress Concern Present (01/23/2023)   [employer] of Occupational Health -  Occupational Stress Questionnaire    Feeling of Stress : Not at all  Social Connections: Socially Integrated (01/23/2023)   Social Connection and Isolation Panel    Frequency of Communication with Friends and Family: More than three times a week    Frequency of Social Gatherings with Friends and Family: More than three times a week    Attends Religious Services: More than 4 times per year    Active Member of [employer] or Organizations: Yes    Attends Banker Meetings: More than 4 times per year    Marital Status: Married  Catering Manager Violence: Not At Risk (01/23/2023)  Humiliation, Afraid, Rape, and Kick questionnaire    Fear of Current or Ex-Partner: No    Emotionally Abused: No    Physically Abused: No    Sexually Abused: No    Outpatient Medications Prior to Visit  Medication Sig Dispense Refill   acetaminophen  (TYLENOL ) 500 MG tablet Take 1 tablet (500 mg total) by mouth every 6 (six) hours as needed. 30 tablet 0   albuterol  (VENTOLIN  HFA) 108 (90 Base) MCG/ACT inhaler INHALE 2 PUFFS BY MOUTH EVERY 6 HOURS AS NEEDED FOR WHEEZING FOR SHORTNESS OF BREATH 18 g 0   apixaban  (ELIQUIS ) 5 MG TABS tablet TAKE 1 TABLET BY MOUTH TWICE  DAILY 200 tablet 1   Atogepant (QULIPTA) 60 MG TABS Take 1 tablet (60 mg total) by mouth daily. 30 tablet 11   atorvastatin  (LIPITOR ) 80 MG tablet Take 1 tablet (80 mg total) by mouth daily. 90 tablet 3   blood glucose meter kit and supplies KIT Dispense based on patient and insurance preference. Use up to four times daily as directed. 1 each 0   BOTOX  200 units injection PROVIDER TO INJECT 155 UNITS INTO THE MUSCLES OF THE HEAD AND NECK EVERY 3 MONTHS. DISCARD REMAINDER 1 each 2   Chlorphen-PE-Acetaminophen  4-10-325 MG TABS Take 1 tablet by mouth in the morning, at noon, and at bedtime. 21 tablet 0   conjugated estrogens  (PREMARIN ) vaginal cream Apply 0.5 gm which is a pea-sized amount with the applicator or fingertip vaginally at bedtime for 2  weeks and then 2-3 x weekly. 42.5 g 12   cyclobenzaprine  (FLEXERIL ) 10 MG tablet One tablet every twelve hours as needed for spasm. 60 tablet 2   empagliflozin  (JARDIANCE ) 10 MG TABS tablet TAKE 1 TABLET BY MOUTH DAILY  BEFORE BREAKFAST 90 tablet 2   esomeprazole  (NEXIUM ) 40 MG capsule TAKE 1 CAPSULE BY MOUTH DAILY 30 capsule 11   fluticasone  (FLONASE ) 50 MCG/ACT nasal spray Place 2 sprays into both nostrils daily. 16 g 5   gabapentin  (NEURONTIN ) 300 MG capsule TAKE 1 CAPSULE BY MOUTH AT  BEDTIME 100 capsule 2   ipratropium (ATROVENT ) 0.03 % nasal spray Place 2 sprays into both nostrils every 12 (twelve) hours. 30 mL 3   Iron -FA-B Cmp-C-Biot-Probiotic (FUSION PLUS) CAPS Take 1 capsule by mouth daily. 30 capsule 11   lidocaine  (LIDODERM ) 5 % USE 1 PATCH EXTERNALLY ONCE DAILY REMOVE  AND  DISCARD  PATCH  WITHIN  12  HOURS  OR  AS  DIRECTED 30 patch 0   meclizine  (ANTIVERT ) 25 MG tablet Take 1 tablet (25 mg total) by mouth 3 (three) times daily as needed for dizziness. 30 tablet 0   metFORMIN  (GLUCOPHAGE ) 500 MG tablet Take 1 tablet (500 mg total) by mouth 2 (two) times daily with a meal. 180 tablet 3   metoCLOPramide  (REGLAN ) 10 MG tablet Take 1 tablet (10 mg total) by mouth every 8 (eight) hours as needed for up to 7 days for nausea (Headache). 21 tablet 0   metoprolol  succinate (TOPROL -XL) 50 MG 24 hr tablet TAKE 1 TABLET BY MOUTH IN THE MORNING AND 1 AT BEDTIME WITH OR IMMEDIATELY FOLLOWING A MEAL. 180 tablet 2   sacubitril -valsartan  (ENTRESTO ) 97-103 MG TAKE 1 TABLET BY MOUTH TWICE  DAILY 180 tablet 3   Ubrogepant  (UBRELVY ) 50 MG TABS TAKE ONE TABLET BY MOUTH AT THE ONSET OF A MIGRAINE. CAN REPEAT IN 2 HOURS IF NEEDED 10 tablet 11   amoxicillin -clavulanate (AUGMENTIN ) 875-125 MG tablet Take 1 tablet by mouth  2 (two) times daily after a meal. 14 tablet 0   predniSONE  (DELTASONE ) 5 MG tablet Start taking 6 tablets taper by 1 tablet daily until off 21 tablet 0   sacubitril -valsartan  (ENTRESTO ) 49-51  MG Take 1 tablet by mouth 2 (two) times daily. 56 tablet 0   No facility-administered medications prior to visit.    Allergies  Allergen Reactions   Venlafaxine  Nausea And Vomiting and Other (See Comments)    Dizziness, shakiness    ROS Review of Systems  Constitutional:  Negative for chills and fever.  HENT:  Positive for congestion, postnasal drip and sinus pressure. Negative for sore throat.   Eyes:  Negative for pain and discharge.  Respiratory:  Positive for shortness of breath (Exertional, intermittent). Negative for cough.   Cardiovascular:  Negative for chest pain and palpitations.  Gastrointestinal:  Positive for rectal pain. Negative for abdominal pain, diarrhea, nausea and vomiting.  Endocrine: Negative for polydipsia and polyuria.  Genitourinary:  Negative for dysuria and hematuria.  Musculoskeletal:  Positive for back pain. Negative for neck pain and neck stiffness.  Skin:  Negative for rash.  Neurological:  Positive for headaches. Negative for weakness.  Psychiatric/Behavioral:  Negative for agitation and behavioral problems.       Objective:    Physical Exam Vitals reviewed.  Constitutional:      General: She is not in acute distress.    Appearance: She is obese. She is not diaphoretic.  HENT:     Head: Normocephalic and atraumatic.     Nose: Congestion present.     Mouth/Throat:     Mouth: Mucous membranes are moist.  Eyes:     General: No scleral icterus.    Extraocular Movements: Extraocular movements intact.  Cardiovascular:     Rate and Rhythm: Normal rate and regular rhythm.     Heart sounds: Normal heart sounds. No murmur heard. Pulmonary:     Breath sounds: Normal breath sounds. No wheezing or rales.  Musculoskeletal:     Cervical back: Neck supple. No tenderness.     Right lower leg: No edema.     Left lower leg: No edema.  Skin:    General: Skin is warm.     Findings: Erythema (Chronic, over right side of face) present. No rash.   Neurological:     General: No focal deficit present.     Mental Status: She is alert and oriented to person, place, and time.     Sensory: No sensory deficit.     Motor: No weakness.  Psychiatric:        Mood and Affect: Mood normal.        Behavior: Behavior normal.     BP 118/78   Pulse (!) 111   Ht 5' 2 (1.575 m)   Wt 257 lb (116.6 kg)   SpO2 96%   BMI 47.01 kg/m  Wt Readings from Last 3 Encounters:  03/05/24 257 lb (116.6 kg)  02/20/24 263 lb (119.3 kg)  02/14/24 257 lb (116.6 kg)    Lab Results  Component Value Date   TSH 2.480 05/26/2023   Lab Results  Component Value Date   WBC 10.9 (H) 02/22/2024   HGB 12.7 02/22/2024   HCT 42.8 02/22/2024   MCV 71.2 (L) 02/22/2024   PLT 377 02/22/2024   Lab Results  Component Value Date   NA 140 08/18/2023   K 4.2 08/18/2023   CO2 28 08/18/2023   GLUCOSE 124 (H) 08/18/2023   BUN 17  08/18/2023   CREATININE 1.03 (H) 08/18/2023   BILITOT 0.5 08/18/2023   ALKPHOS 84 08/18/2023   AST 16 08/18/2023   ALT 12 08/18/2023   PROT 7.9 08/18/2023   ALBUMIN 3.6 08/18/2023   CALCIUM  9.8 08/18/2023   ANIONGAP 7 08/18/2023   EGFR 84 05/26/2023   Lab Results  Component Value Date   CHOL 165 05/26/2023   Lab Results  Component Value Date   HDL 39 (L) 05/26/2023   Lab Results  Component Value Date   LDLCALC 109 (H) 05/26/2023   Lab Results  Component Value Date   TRIG 88 05/26/2023   Lab Results  Component Value Date   CHOLHDL 4.2 05/26/2023   Lab Results  Component Value Date   HGBA1C 6.0 (H) 09/26/2023      Assessment & Plan:   Problem List Items Addressed This Visit       Cardiovascular and Mediastinum   Migraine (Chronic)   On Qulipta Has started Botox  injections as well Ubrelvy  PRN Follows up with Neurology      HTN (hypertension) - Primary (Chronic)   BP Readings from Last 1 Encounters:  03/05/24 118/78   Well-controlled with Entresto  and Metoprolol  Counseled for compliance with the  medications Advised DASH diet and moderate exercise/walking, at least 150 mins/week      Relevant Orders   CMP14+EGFR   CBC with Differential/Platelet   PAF (paroxysmal atrial fibrillation) (HCC) (Chronic)   Currently in sinus rhythm On Toprol  and Eliquis  Follows up with Cardiologist      Hemorrhoids   Has rectal pain due to hemorrhoids Anusol suppository prescribed Urgent referral to GI Advised to take Miralax for constipation      Relevant Medications   hydrocortisone (ANUSOL-HC) 25 MG suppository   Other Relevant Orders   Ambulatory referral to Gastroenterology     Respiratory   Obstructive sleep apnea (Chronic)   Uses CPAP regularly, continues to benefit from it Has h/o severe sleep apnea - would benefit from Zepbound, will discuss in the next visit        Endocrine   Type 2 diabetes mellitus with other specified complication (HCC)   Lab Results  Component Value Date   HGBA1C 6.0 (H) 09/26/2023   Associated with HTN, CHF and HLD Well-controlled On Metformin  500 mg BID and Jardiance  10 mg QD Considering her comorbidities, had started Ozempic , but did not tolerate - DC Ozempic  On statin Has an Ophthalmologist in [place], but prefers to see Dr Waylan now, referral provided      Relevant Orders   CMP14+EGFR   Hemoglobin A1c     Other   Hyperlipidemia (Chronic)   On Atorvastatin  80 mg once daily Lipid profile reviewed         Meds ordered this encounter  Medications   hydrocortisone (ANUSOL-HC) 25 MG suppository    Sig: Place 1 suppository (25 mg total) rectally 2 (two) times daily.    Dispense:  12 suppository    Refill:  2    Follow-up: Return in about 4 months (around 07/03/2024) for DM.    Suzzane MARLA Blanch, MD

## 2024-03-05 NOTE — Assessment & Plan Note (Signed)
 Currently in sinus rhythm On Toprol and Eliquis Follows up with Cardiologist

## 2024-03-05 NOTE — Assessment & Plan Note (Signed)
 On Atorvastatin  80 mg once daily Lipid profile reviewed

## 2024-03-05 NOTE — Assessment & Plan Note (Signed)
 BP Readings from Last 1 Encounters:  03/05/24 118/78   Well-controlled with Entresto  and Metoprolol  Counseled for compliance with the medications Advised DASH diet and moderate exercise/walking, at least 150 mins/week

## 2024-03-07 ENCOUNTER — Encounter: Payer: Self-pay | Admitting: Gastroenterology

## 2024-03-09 ENCOUNTER — Other Ambulatory Visit: Payer: Self-pay | Admitting: Internal Medicine

## 2024-03-09 DIAGNOSIS — E782 Mixed hyperlipidemia: Secondary | ICD-10-CM

## 2024-03-11 ENCOUNTER — Encounter: Payer: Self-pay | Admitting: Neurology

## 2024-03-15 ENCOUNTER — Ambulatory Visit: Admitting: Cardiology

## 2024-03-18 ENCOUNTER — Telehealth (INDEPENDENT_AMBULATORY_CARE_PROVIDER_SITE_OTHER): Payer: Self-pay

## 2024-03-18 NOTE — Telephone Encounter (Signed)
 Patient needs to cancel her appointment with Dr. Karis tomorrow and reschedule it to the first week in December.

## 2024-03-19 ENCOUNTER — Ambulatory Visit (INDEPENDENT_AMBULATORY_CARE_PROVIDER_SITE_OTHER): Admitting: Otolaryngology

## 2024-03-25 ENCOUNTER — Ambulatory Visit (HOSPITAL_COMMUNITY)
Admission: RE | Admit: 2024-03-25 | Discharge: 2024-03-25 | Disposition: A | Discharge status: Home / Self Care | Source: Ambulatory Visit | Attending: Otolaryngology | Admitting: Otolaryngology

## 2024-03-25 DIAGNOSIS — J324 Chronic pansinusitis: Secondary | ICD-10-CM | POA: Diagnosis present

## 2024-04-02 ENCOUNTER — Ambulatory Visit (INDEPENDENT_AMBULATORY_CARE_PROVIDER_SITE_OTHER): Admitting: Otolaryngology

## 2024-04-11 ENCOUNTER — Ambulatory Visit: Admitting: Gastroenterology

## 2024-04-14 ENCOUNTER — Other Ambulatory Visit: Payer: Self-pay | Admitting: Internal Medicine

## 2024-04-14 DIAGNOSIS — K219 Gastro-esophageal reflux disease without esophagitis: Secondary | ICD-10-CM

## 2024-04-15 NOTE — Progress Notes (Deleted)
 [facility] [address], [place] [address]   CLINIC:  Medical Oncology/Hematology  PCP:  Tobie Suzzane POUR, MD [address] [place] [address] [phone]   REASON FOR VISIT:  Follow-up for lymphadenopathy and iron  deficiency anemia   PRIOR THERAPY: None   CURRENT THERAPY: Intermittent IV iron , iron  tablet daily  INTERVAL HISTORY:   [name] 68 y.o. female returns for routine follow-up of lymphadenopathy and iron  deficiency anemia.   She was last seen by Pleasant Barefoot PA-C on 08/23/2023.  At today's visit, she reports feeling ***. ***No recent hospitalizations, surgeries, or changes in baseline health status. ***She has 100***% energy and 100***% appetite.  ***Her weight has been stable.   She continues to take iron  tablet most days, occasionally skips it due to diarrhea.  ***She also eats an iron  rich diet.    ***She denies any gross hematochezia, melena, or epistaxis. ***She reports that her energy is good. ***Her migraine headaches have resolved after starting Botox  injections. ***Denies any restless legs, pica, dyspnea on exertion, chest pain, lightheadedness, or syncope.   She is having some recurrent sinus issues due to seasonal allergies, but denies current lymphadenopathy.   *** ***She reports that she continues to see Dr. Karis due to chronic inflammation and swelling of her nasal passages related to narrow nasal passages. ***he denies any recent infections, fever, chills, night sweats, or unintentional weight loss.   ASSESSMENT & PLAN:  1.  Subcentimeter left supraclavicular adenopathy, waxing and waning - She reported lymphadenopathy since ~September 2020 - Ultrasound soft tissue neck on 01/25/2019 shows left supraclavicular lymph node measuring 1 cm.  A CT of the soft tissue of the neck on the same day showed 3 small supraclavicular lymph nodes, largest measuring 8-9 mm.  Small posterior triangle lymph node, none more than 9 mm.   Consistent with mild reactive adenitis. - CT soft tissue neck with contrast on 02/22/2019 showed right level 2 lymph node measuring 6 mm unchanged.  Subcentimeter posterior triangle lymph nodes on the right unchanged.  Largest measures 8 mm.  Left level 2 lymph node measures 8.6 mm unchanged.  Cluster of supraclavicular lymph nodes on the left also slightly smaller.  Largest measures 5 mm.  No oropharyngeal masses. - CT soft tissue neck on 06/17/2019 shows left level 2 lymph node measuring 1.2 cm, previously 1.1 cm.  Stable cluster of nonenlarged left supraclavicular lymph nodes. - CT angiogram from 05/13/2020 which showed no lymphadenopathy in the base of the neck.  No thoracic adenopathy.  Diffuse normal-sized mediastinal and axillary lymph nodes noted. - Former smoker.  She smoked 1 pack of cigarettes per week for 35 years.  She quit smoking in 2013. - She sees Dr. Karis for chronic sinusitis*** - No recent infections or antibiotics*** - She does not report any fevers, night sweats or weight loss in the last 6 months.   *** - Physical examination today(***) did not reveal any palpable adenopathy in the neck region or axillary region.    - Most recent CBC (02/22/2024): Mildly elevated WBC 10.9, with normal differential  - Suspect intermittent reactive lymphadenitis/lymphadenopathy, likely related to flareups of her chronic sinusitis*** - PLAN: No further work-up indicated at this time.  We will reassess with physical exam and follow-up visit in *** 1 year ***, or sooner if needed based on new symptoms.***  2.  Iron  deficiency anemia, with chronic microcytosis: - She reports taking iron  tablet daily for many years.   -  EGD/colonoscopy (02/02/2015): Nonerosive gastritis, no source for anemia identified on EGD.  Polyp and diverticulosis, rectal bleeding from small internal hemorrhoids. - No major bleeding episodes such as epistaxis, hematemesis, hematochezia, or melena*** - Most recent IV iron  with  Venofer  1000 mg divided doses in December 2023/January 2024 - Most recent labs (02/22/2024): Hgb 12.7/MCV 71.2 with elevated RBC 6.01.  Ferritin improving at 49, iron  saturation 21%.   - Longstanding history of microcytic erythrocytosis, regardless of hemoglobin levels.  Hemoglobin fractionation cascade was negative, but this does not rule out alpha thalassemia trait.   - PLAN: Although iron  levels are low, she is asymptomatic and would prefer to defer any IV iron  for the time being. *** She will contact us  sooner if she has any new fatigue or lethargy prior to her next appointment. - Continue oral iron  supplementation*** - Repeat CBC and iron  panel and follow-up in 1 year *** - We discussed suspected alpha thalassemia trait, but patient DECLINES genetic testing, as it would be unlikely to change overall management.***   PLAN SUMMARY:*** >> Labs in 1 year = CBC/D, ferritin, iron /TIBC >> OFFICE visit after labs     REVIEW OF SYSTEMS: ***  Review of Systems - Oncology   PHYSICAL EXAM:  ECOG PERFORMANCE STATUS: {CHL ONC ECOG ED:[id number]} *** There were no vitals filed for this visit. There were no vitals filed for this visit. Physical Exam  PAST MEDICAL/SURGICAL HISTORY:  Past Medical History:  Diagnosis Date   Abdominal fibromatosis    Allergic rhinitis    Anemia    Asthma    Essential hypertension    GERD (gastroesophageal reflux disease)    History of cardiac catheterization    No significant CAD May 2015   History of migraine headaches    Hyperlipidemia    Iron  deficiency anemia 02/05/2021   Myocarditis (HCC)    a. diagnosed in 05/2020 with cath showing normal cors   Obesity    PAF (paroxysmal atrial fibrillation) (HCC) 08/2013   Sleep apnea    Type 2 diabetes mellitus [facility])    Past Surgical History:  Procedure Laterality Date   CATARACT EXTRACTION     CATARACT EXTRACTION W/PHACO Right 04/05/2019   Procedure: CATARACT EXTRACTION PHACO AND INTRAOCULAR LENS PLACEMENT  RIGHT EYE (CDE: 3.19);  Surgeon: Harrie Agent, MD;  Location: [facility];  Service: Ophthalmology;  Laterality: Right;   COLONOSCOPY N/A 02/02/2015   SLF: 1. one colon polyp removed-no source for anemia identified. 2. moderate diverticulosis noted in the sigmoid colon and descending colon 3. the left colon is redundant 4. Rectal bleeding due ot small internal hemorroids 5. Moderate sized external hemorrhoids.   ESOPHAGOGASTRODUODENOSCOPY N/A 02/02/2015   SLF: 1. 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 large hiatal hernia 3. mild non-erosive gastritis 4. No source for anemia identified.    LEFT HEART CATH AND CORONARY ANGIOGRAPHY N/A 05/14/2020   Procedure: LEFT HEART CATH AND CORONARY ANGIOGRAPHY;  Surgeon: Claudene Victory ORN, MD;  Location: [facility] INVASIVE CV LAB;  Service: Cardiovascular;  Laterality: N/A;   LEFT HEART CATHETERIZATION WITH CORONARY ANGIOGRAM N/A 09/03/2013   Procedure: LEFT HEART CATHETERIZATION WITH CORONARY ANGIOGRAM;  Surgeon: Candyce GORMAN Reek, MD;  Location: [facility] CATH LAB;  Service: Cardiovascular;  Laterality: N/A;   Right carpal tunnel release      SOCIAL HISTORY:  Social History   Socioeconomic History   Marital status: Single    Spouse name: Not on file   Number of children: 2   Years of education: Not  on file   Highest education level: Not on file  Occupational History   Occupation: Child psychotherapist    Employer: [employer]  Tobacco Use   Smoking status: Former    Current packs/day: 0.00    Average packs/day: 0.3 packs/day    Types: Cigarettes    Quit date: 04/01/2012    Years since quitting: 12.0   Smokeless tobacco: Never  Vaping Use   Vaping status: Never Used  Substance and Sexual Activity   Alcohol use: Not Currently    Alcohol/week: 0.0 standard drinks of alcohol    Comment: very occasional   Drug use: No   Sexual activity: Not Currently    Birth control/protection: Post-menopausal  Other Topics Concern   Not on file  Social History  Narrative   Not on file   Social Drivers of Health   Tobacco Use: Medium Risk (03/05/2024)   Patient History    Smoking Tobacco Use: Former    Smokeless Tobacco Use: Never    Passive Exposure: Not on Actuary Strain: Low Risk (01/23/2023)   Overall Financial Resource Strain (CARDIA)    Difficulty of Paying Living Expenses: Not hard at all  Food Insecurity: No Food Insecurity (01/23/2023)   Hunger Vital Sign    Worried About Running Out of Food in the Last Year: Never true    Ran Out of Food in the Last Year: Never true  Transportation Needs: No Transportation Needs (01/23/2023)   PRAPARE - Administrator, Civil Service (Medical): No    Lack of Transportation (Non-Medical): No  Physical Activity: Insufficiently Active (01/23/2023)   Exercise Vital Sign    Days of Exercise per Week: 7 days    Minutes of Exercise per Session: 20 min  Stress: No Stress Concern Present (01/23/2023)   [employer] of Occupational Health - Occupational Stress Questionnaire    Feeling of Stress : Not at all  Social Connections: Socially Integrated (01/23/2023)   Social Connection and Isolation Panel    Frequency of Communication with Friends and Family: More than three times a week    Frequency of Social Gatherings with Friends and Family: More than three times a week    Attends Religious Services: More than 4 times per year    Active Member of Clubs or Organizations: Yes    Attends Banker Meetings: More than 4 times per year    Marital Status: Married  Catering Manager Violence: Not At Risk (01/23/2023)   Humiliation, Afraid, Rape, and Kick questionnaire    Fear of Current or Ex-Partner: No    Emotionally Abused: No    Physically Abused: No    Sexually Abused: No  Depression (PHQ2-9): Low Risk (03/05/2024)   Depression (PHQ2-9)    PHQ-2 Score: 0  Alcohol Screen: Low Risk (01/23/2023)   Alcohol Screen    Last Alcohol Screening Score (AUDIT): 0  Housing: Low  Risk (01/23/2023)   Housing    Last Housing Risk Score: 0  Utilities: Not At Risk (01/23/2023)   AHC Utilities    Threatened with loss of utilities: No  Health Literacy: Adequate Health Literacy (01/23/2023)   B1300 Health Literacy    Frequency of need for help with medical instructions: Never    FAMILY HISTORY:  Family History  Problem Relation Age of Onset   Diabetes Mother    Hypertension Mother    Heart disease Mother    High Cholesterol Mother    Alzheimer's disease Father  Hypertension Sister    Hypertension Brother    Diabetes Brother    Arthritis Sister    Hypertension Sister    High Cholesterol Sister    Diabetes Son    Developmental delay Son    Colon cancer Neg Hx     CURRENT MEDICATIONS:  Outpatient Encounter Medications as of 04/16/2024  Medication Sig   acetaminophen  (TYLENOL ) 500 MG tablet Take 1 tablet (500 mg total) by mouth every 6 (six) hours as needed.   albuterol  (VENTOLIN  HFA) 108 (90 Base) MCG/ACT inhaler INHALE 2 PUFFS BY MOUTH EVERY 6 HOURS AS NEEDED FOR WHEEZING FOR SHORTNESS OF BREATH   apixaban  (ELIQUIS ) 5 MG TABS tablet TAKE 1 TABLET BY MOUTH TWICE  DAILY   Atogepant  (QULIPTA ) 60 MG TABS Take 1 tablet (60 mg total) by mouth daily.   atorvastatin  (LIPITOR ) 80 MG tablet TAKE 1 TABLET BY MOUTH DAILY   blood glucose meter kit and supplies KIT Dispense based on patient and insurance preference. Use up to four times daily as directed.   BOTOX  200 units injection PROVIDER TO INJECT 155 UNITS INTO THE MUSCLES OF THE HEAD AND NECK EVERY 3 MONTHS. DISCARD REMAINDER   Chlorphen-PE-Acetaminophen  4-10-325 MG TABS Take 1 tablet by mouth in the morning, at noon, and at bedtime.   conjugated estrogens  (PREMARIN ) vaginal cream Apply 0.5 gm which is a pea-sized amount with the applicator or fingertip vaginally at bedtime for 2 weeks and then 2-3 x weekly.   cyclobenzaprine  (FLEXERIL ) 10 MG tablet One tablet every twelve hours as needed for spasm.   empagliflozin   (JARDIANCE ) 10 MG TABS tablet TAKE 1 TABLET BY MOUTH DAILY  BEFORE BREAKFAST   esomeprazole  (NEXIUM ) 40 MG capsule TAKE 1 CAPSULE BY MOUTH DAILY   fluticasone  (FLONASE ) 50 MCG/ACT nasal spray Place 2 sprays into both nostrils daily.   gabapentin  (NEURONTIN ) 300 MG capsule TAKE 1 CAPSULE BY MOUTH AT  BEDTIME   hydrocortisone  (ANUSOL -HC) 25 MG suppository Place 1 suppository (25 mg total) rectally 2 (two) times daily.   ipratropium (ATROVENT ) 0.03 % nasal spray Place 2 sprays into both nostrils every 12 (twelve) hours.   Iron -FA-B Cmp-C-Biot-Probiotic (FUSION PLUS) CAPS Take 1 capsule by mouth daily.   lidocaine  (LIDODERM ) 5 % USE 1 PATCH EXTERNALLY ONCE DAILY REMOVE  AND  DISCARD  PATCH  WITHIN  12  HOURS  OR  AS  DIRECTED   meclizine  (ANTIVERT ) 25 MG tablet Take 1 tablet (25 mg total) by mouth 3 (three) times daily as needed for dizziness.   metFORMIN  (GLUCOPHAGE ) 500 MG tablet Take 1 tablet (500 mg total) by mouth 2 (two) times daily with a meal.   metoCLOPramide  (REGLAN ) 10 MG tablet Take 1 tablet (10 mg total) by mouth every 8 (eight) hours as needed for up to 7 days for nausea (Headache).   metoprolol  succinate (TOPROL -XL) 50 MG 24 hr tablet TAKE 1 TABLET BY MOUTH IN THE MORNING AND 1 AT BEDTIME WITH OR IMMEDIATELY FOLLOWING A MEAL.   sacubitril -valsartan  (ENTRESTO ) 97-103 MG TAKE 1 TABLET BY MOUTH TWICE  DAILY   Ubrogepant  (UBRELVY ) 50 MG TABS TAKE ONE TABLET BY MOUTH AT THE ONSET OF A MIGRAINE. CAN REPEAT IN 2 HOURS IF NEEDED   No facility-administered encounter medications on file as of 04/16/2024.    ALLERGIES:  Allergies[1]  LABORATORY DATA:  I have reviewed the labs as listed.  CBC    Component Value Date/Time   WBC 10.9 (H) 02/22/2024 0937   RBC 6.01 (H) 02/22/2024 9062  HGB 12.7 02/22/2024 0937   HGB 13.0 05/26/2023 0956   HCT 42.8 02/22/2024 0937   HCT 44.8 05/26/2023 0956   PLT 377 02/22/2024 0937   PLT 380 05/26/2023 0956   MCV 71.2 (L) 02/22/2024 0937   MCV 74 (L)  05/26/2023 0956   MCH 21.1 (L) 02/22/2024 0937   MCHC 29.7 (L) 02/22/2024 0937   RDW 18.3 (H) 02/22/2024 0937   RDW 15.6 (H) 05/26/2023 0956   LYMPHSABS 2.7 02/22/2024 0937   LYMPHSABS 2.3 05/26/2023 0956   MONOABS 0.8 02/22/2024 0937   EOSABS 0.2 02/22/2024 0937   EOSABS 0.2 05/26/2023 0956   BASOSABS 0.0 02/22/2024 0937   BASOSABS 0.0 05/26/2023 0956      Latest Ref Rng & Units 08/18/2023   10:05 AM 05/26/2023    9:56 AM 01/23/2023    9:32 AM  CMP  Glucose 70 - 99 mg/dL 875  894  891   BUN 8 - 23 mg/dL 17  13  17    Creatinine 0.44 - 1.00 mg/dL 8.96  9.21  9.10   Sodium 135 - 145 mmol/L 140  143  145   Potassium 3.5 - 5.1 mmol/L 4.2  4.5  4.1   Chloride 98 - 111 mmol/L 105  103  107   CO2 22 - 32 mmol/L 28  25  23    Calcium  8.9 - 10.3 mg/dL 9.8  9.7  9.4   Total Protein 6.5 - 8.1 g/dL 7.9  7.0  6.9   Total Bilirubin 0.0 - 1.2 mg/dL 0.5  <9.7  0.2   Alkaline Phos 38 - 126 U/L 84  104  93   AST 15 - 41 U/L 16  17  15    ALT 0 - 44 U/L 12  14  10      DIAGNOSTIC IMAGING:  I have independently reviewed the relevant imaging and discussed with the patient.   WRAP UP:  All questions were answered. The patient knows to call the clinic with any problems, questions or concerns.  Medical decision making: ***  Time spent on visit: I spent *** minutes counseling the patient face to face. The total time spent in the appointment was *** minutes and more than 50% was on counseling.  Pleasant CHRISTELLA Barefoot, PA-C  ***    [1]  Allergies Allergen Reactions   Venlafaxine  Nausea And Vomiting and Other (See Comments)    Dizziness, shakiness

## 2024-04-16 ENCOUNTER — Inpatient Hospital Stay: Attending: Physician Assistant | Admitting: Physician Assistant

## 2024-04-22 ENCOUNTER — Other Ambulatory Visit: Payer: Self-pay | Admitting: Cardiology

## 2024-04-29 ENCOUNTER — Encounter: Payer: Self-pay | Admitting: *Deleted

## 2024-04-30 LAB — HM DIABETES EYE EXAM

## 2024-05-01 ENCOUNTER — Ambulatory Visit: Admitting: Cardiology

## 2024-05-09 ENCOUNTER — Encounter: Payer: Self-pay | Admitting: Orthopedic Surgery

## 2024-05-09 ENCOUNTER — Ambulatory Visit: Admitting: Orthopedic Surgery

## 2024-05-09 DIAGNOSIS — M1812 Unilateral primary osteoarthritis of first carpometacarpal joint, left hand: Secondary | ICD-10-CM | POA: Diagnosis not present

## 2024-05-09 DIAGNOSIS — M1811 Unilateral primary osteoarthritis of first carpometacarpal joint, right hand: Secondary | ICD-10-CM

## 2024-05-09 MED ORDER — METHYLPREDNISOLONE ACETATE 40 MG/ML IJ SUSP
40.0000 mg | Freq: Once | INTRAMUSCULAR | Status: AC
  Administered 2024-05-09: 40 mg via INTRA_ARTICULAR

## 2024-05-09 NOTE — Progress Notes (Signed)
 Chief Complaint  Patient presents with   Injections    Both thumbs     Patient: Monique Gomez           Date of Birth: 1955-05-05           MRN: [id number] Visit Date: 05/09/2024 Requested by: Tobie Suzzane POUR, MD [address] [place],  [place] [address] PCP: Tobie Suzzane POUR, MD  Encounter Diagnoses  Name Primary?   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  We repeated the injections with good result  Meds ordered this encounter  Medications   methylPREDNISolone  acetate (DEPO-MEDROL ) injection 40 mg   methylPREDNISolone  acetate (DEPO-MEDROL ) injection 40 mg     Chief Complaint  Patient presents with   Injections    Both thumbs   Procedure note injection left thumb  Injection left CMC joint Medication Depo-Medrol  40 mg and lidocaine  1% 2 cc The patient gave verbal consent Timeout confirmed the site of injection left CMC joint  Alcohol and ethyl chloride was used to repair the skin and then a 25-gauge needle was used to inject the [facility] joint of the left thumb  There were no complications a sterile Band-Aid was applied Procedure note injection right thumb  Injection right CMC joint Medication Depo-Medrol  40 mg and lidocaine  1% 2 cc The patient gave verbal consent Timeout confirmed the site of injection right CMC joint  Alcohol and ethyl chloride was used to repair the skin and then a 25-gauge needle was used to inject the [facility] joint of the right thumb  There were no complications a sterile Band-Aid was applied

## 2024-05-11 ENCOUNTER — Other Ambulatory Visit: Payer: Self-pay | Admitting: Internal Medicine

## 2024-05-11 ENCOUNTER — Other Ambulatory Visit: Payer: Self-pay | Admitting: Cardiology

## 2024-05-11 DIAGNOSIS — E1169 Type 2 diabetes mellitus with other specified complication: Secondary | ICD-10-CM

## 2024-05-13 NOTE — Telephone Encounter (Signed)
 Pt last saw Brittany Strader, [place] on 08/02/23, last labs 08/08/23 Creat 1.03, age 69, weight 116.6kg, based on specified criteria pt is on appropriate dosage of Eliquis  5mg  BID for afib.  Will refill rx.

## 2024-05-16 ENCOUNTER — Ambulatory Visit (INDEPENDENT_AMBULATORY_CARE_PROVIDER_SITE_OTHER): Admitting: Otolaryngology

## 2024-05-16 ENCOUNTER — Ambulatory Visit (INDEPENDENT_AMBULATORY_CARE_PROVIDER_SITE_OTHER): Admitting: Orthopedic Surgery

## 2024-05-16 ENCOUNTER — Encounter: Payer: Self-pay | Admitting: Orthopedic Surgery

## 2024-05-16 VITALS — BP 138/91 | HR 85

## 2024-05-16 DIAGNOSIS — J343 Hypertrophy of nasal turbinates: Secondary | ICD-10-CM

## 2024-05-16 DIAGNOSIS — R519 Headache, unspecified: Secondary | ICD-10-CM

## 2024-05-16 DIAGNOSIS — M25512 Pain in left shoulder: Secondary | ICD-10-CM | POA: Diagnosis not present

## 2024-05-16 DIAGNOSIS — J310 Chronic rhinitis: Secondary | ICD-10-CM | POA: Diagnosis not present

## 2024-05-16 DIAGNOSIS — G8929 Other chronic pain: Secondary | ICD-10-CM

## 2024-05-16 MED ORDER — METHYLPREDNISOLONE ACETATE 40 MG/ML IJ SUSP
40.0000 mg | Freq: Once | INTRAMUSCULAR | Status: AC
  Administered 2024-05-16: 40 mg via INTRA_ARTICULAR

## 2024-05-16 NOTE — Progress Notes (Signed)
 Patient ID: Monique Gomez, female   DOB: [birth year], 69 y.o.   MRN: [id number]  Follow-up: Chronic nasal congestion, recurrent sinusitis and facial pain  HPI: The patient is a 69 year old female who returns today for follow-up evaluation.  The patient has a history of chronic nasal congestion and recurrent sinusitis.  She was previously treated with Flonase  nasal spray and Zyrtec .  She underwent a sinus CT scan in November 2025.  The CT did not show any significant acute or chronic sinusitis.  The patient returns today complaining of persistent facial pain and pressure.  Her nasal breathing has improved with the use of Flonase  and Zyrtec .  Exam: General: Communicates without difficulty, well nourished, no acute distress. Head: Normocephalic, no evidence injury, no tenderness, facial buttresses intact without stepoff. Face/sinus: No tenderness to palpation and percussion. Facial movement is normal and symmetric. Eyes: PERRL, EOMI. No scleral icterus, conjunctivae clear. Neuro: CN II exam reveals vision grossly intact.  No nystagmus at any point of gaze. Ears: Auricles well formed without lesions.  Ear canals are intact without mass or lesion.  No erythema or edema is appreciated.  The TMs are intact without fluid. Nose: External evaluation reveals normal support and skin without lesions.  Dorsum is intact.  Anterior rhinoscopy reveals congested mucosa over anterior aspect of inferior turbinates and intact septum.  No purulence noted. Oral:  Oral cavity and oropharynx are intact, symmetric, without erythema or edema.  Mucosa is moist without lesions. Neck: Full range of motion without pain.  There is no significant lymphadenopathy.  No masses palpable.  Thyroid  bed within normal limits to palpation.  Parotid glands and submandibular glands equal bilaterally without mass.  Trachea is midline. Neuro:  CN 2-12 grossly intact.   Assessment: 1.  Chronic rhinitis with nasal mucosal congestion and bilateral  inferior turbinate hypertrophy. 2.  No significant acute sinusitis is noted on today's examination and her recent CT scan. 3.  Her persistent facial pain may be secondary to neurogenic causes.  She has a history of migraine headaches.  Plan: 1.  The physical exam findings and the CT images are reviewed with the patient. 2.  Continue with Flonase  nasal spray and Zyrtec  daily. 3.  Nasal saline irrigation as needed. 4.  The patient is encouraged to follow-up with her neurologist regarding her persistent facial pain. 5.  The patient will return for reevaluation in 6 months.

## 2024-05-16 NOTE — Progress Notes (Signed)
" ° °  Procedure note for injection   Chief Complaint  Patient presents with   Injections    Wants injection Left shoulder      Encounter Diagnosis  Name Primary?   Chronic left shoulder pain Yes        The patient has consented for injection of the left  Joint: subacromial space   Medication: Depo-Medrol  40 mg and lidocaine  1%  Time out completed: Yes  The site of injection was cleaned with alcohol and ethyl chloride.  The injection was given without any complications appropriate precautions were given.  "

## 2024-05-16 NOTE — Patient Instructions (Signed)
 Joint Steroid Injection A joint steroid injection is a procedure to relieve swelling and pain in a joint. Steroids are medicines that reduce inflammation. In this procedure, your health care provider uses a syringe and a needle to inject a steroid medicine into a painful and inflamed joint. A pain-relieving medicine (anesthetic) may be injected along with the steroid. In some cases, your health care provider may use an imaging technique such as ultrasound or fluoroscopy to guide the injection. Joints that are often treated with steroid injections include the knee, shoulder, hip, and spine. These injections may also be used in the elbow, ankle, and joints of the hands or feet. You may have joint steroid injections as part of your treatment for inflammation caused by: Gout. Rheumatoid arthritis. Advanced wear-and-tear arthritis (osteoarthritis). Tendinitis. Bursitis. Joint steroid injections may be repeated, but having them too often can damage a joint or the skin over the joint. You should not have joint steroid injections less than 6 weeks apart or more than four times a year. Tell a health care provider about: Any allergies you have. All medicines you are taking, including vitamins, herbs, eye drops, creams, and over-the-counter medicines. Any problems you or family members have had with anesthetic medicines. Any blood disorders you have. Any surgeries you have had. Any medical conditions you have. Whether you are pregnant or may be pregnant. What are the risks? Generally, this is a safe treatment. However, problems may occur, including: Infection. Bleeding. Allergic reactions to medicines. Damage to the joint or tissues around the joint. Thinning of skin or loss of skin color over the joint. Temporary flushing of the face or chest. Temporary increase in pain. Temporary increase in blood sugar. Failure to relieve inflammation or pain. What happens before the treatment? Medicines Ask  your health care provider about: Changing or stopping your regular medicines. This is especially important if you are taking diabetes medicines or blood thinners. Taking medicines such as aspirin and ibuprofen. These medicines can thin your blood. Do not take these medicines unless your health care provider tells you to take them. Taking over-the-counter medicines, vitamins, herbs, and supplements. General instructions You may have imaging tests of your joint. Ask your health care provider if you can drive yourself home after the procedure. What happens during the treatment?  Your health care provider will position you for the injection and locate the injection site over your joint. The skin over the joint will be cleaned with a germ-killing soap. Your health care provider may: Spray a numbing solution (topical anesthetic) over the injection site. Inject a local anesthetic under the skin above your joint. The needle will be placed through your skin into your joint. Your health care provider may use imaging to guide the needle to the right spot for the injection. If imaging is used, a special contrast dye may be injected to confirm that the needle is in the correct location. The steroid medicine will be injected into your joint. Anesthetic may be injected along with the steroid. This may be a medicine that relieves pain for a short time (short-acting anesthetic) or for a longer time (long-acting anesthetic). The needle will be removed, and an adhesive bandage (dressing) will be placed over the injection site. The procedure may vary among health care providers and hospitals. What can I expect after the treatment? You will be able to go home after the treatment. It is normal to feel slight flushing for a few days after the injection. After the treatment, it is  common to have an increase in joint pain after the anesthetic has worn off. This may happen about an hour after a short-acting anesthetic  or about 8 hours after a longer-acting anesthetic. You should begin to feel relief from joint pain and swelling after 24 to 48 hours. Contact your health care provider if you do not begin to feel relief after 2 days. Follow these instructions at home: Injection site care Leave the adhesive dressing over your injection site in place until your health care provider says you can remove it. Check your injection site every day for signs of infection. Check for: More redness, swelling, or pain. Fluid or blood. Warmth. Pus or a bad smell. Activity Return to your normal activities as told by your health care provider. Ask your health care provider what activities are safe for you. You may be asked to limit activities that put stress on the joint for a few days. Do joint exercises as told by your health care provider. Do not take baths, swim, or use a hot tub until your health care provider approves. Ask your health care provider if you may take showers. You may only be allowed to take sponge baths. Managing pain, stiffness, and swelling  If directed, put ice on the joint. To do this: Put ice in a plastic bag. Place a towel between your skin and the bag. Leave the ice on for 20 minutes, 2-3 times a day. Remove the ice if your skin turns bright red. This is very important. If you cannot feel pain, heat, or cold, you have a greater risk of damage to the area. Raise (elevate) your joint above the level of your heart when you are sitting or lying down. General instructions Take over-the-counter and prescription medicines only as told by your health care provider. Do not use any products that contain nicotine or tobacco, such as cigarettes, e-cigarettes, and chewing tobacco. These can delay joint healing. If you need help quitting, ask your health care provider. If you have diabetes, be aware that your blood sugar may be slightly elevated for several days after the injection. Keep all follow-up visits.  This is important. Contact a health care provider if you have: Chills or a fever. Any signs of infection at your injection site. Increased pain or swelling or no relief after 2 days. Summary A joint steroid injection is a treatment to relieve pain and swelling in a joint. Steroids are medicines that reduce inflammation. Your health care provider may add an anesthetic along with the steroid. You may have joint steroid injections as part of your arthritis treatment. Joint steroid injections may be repeated, but having them too often can damage a joint or the skin over the joint. Contact your health care provider if you have a fever, chills, or signs of infection, or if you get no relief from joint pain or swelling. This information is not intended to replace advice given to you by your health care provider. Make sure you discuss any questions you have with your health care provider. Document Revised: 09/27/2019 Document Reviewed: 09/27/2019 Elsevier Patient Education  2024 [employer].

## 2024-05-20 ENCOUNTER — Encounter: Payer: Self-pay | Admitting: Neurology

## 2024-05-20 ENCOUNTER — Ambulatory Visit: Attending: Cardiology | Admitting: Cardiology

## 2024-05-20 ENCOUNTER — Encounter: Payer: Self-pay | Admitting: Cardiology

## 2024-05-20 VITALS — BP 132/83 | HR 94 | Ht 62.0 in | Wt 244.0 lb

## 2024-05-20 DIAGNOSIS — Z8679 Personal history of other diseases of the circulatory system: Secondary | ICD-10-CM | POA: Diagnosis not present

## 2024-05-20 DIAGNOSIS — I422 Other hypertrophic cardiomyopathy: Secondary | ICD-10-CM

## 2024-05-20 DIAGNOSIS — I480 Paroxysmal atrial fibrillation: Secondary | ICD-10-CM

## 2024-05-20 NOTE — Progress Notes (Signed)
 "    Cardiology Office Note  Date: 05/20/2024   ID: Monique Gomez, DOB [birth year], MRN [id number]  History of Present Illness: Monique Gomez is a 69 y.o. female last seen in April 2025 by Ms. Strader PA-C, I reviewed her note.  She is here for a follow-up visit.  Reports no exertional chest pain, no sudden dizziness or syncope.  She does have occasional sense of mild palpitations.  No fluid retention.  She has been able to lose weight through portion size control.  We went over her medications.  She reports compliance with her current medications, no obvious intolerances, no spontaneous bleeding problems on Eliquis .  I reviewed her most recent lab work, she will see her PCP in the next few months for repeat lab work as well.  Physical Exam: VS:  BP 132/83 (BP Location: Left Arm, Cuff Size: Large)   Pulse 94   Ht 5' 2 (1.575 m)   Wt 244 lb (110.7 kg)   SpO2 98%   BMI 44.63 kg/m , BMI Body mass index is 44.63 kg/m.  Wt Readings from Last 3 Encounters:  05/20/24 244 lb (110.7 kg)  03/05/24 257 lb (116.6 kg)  02/20/24 263 lb (119.3 kg)    General: Patient appears comfortable at rest. HEENT: Conjunctiva and lids normal. Neck: Supple, no elevated JVP or carotid bruits. Lungs: Clear to auscultation, nonlabored breathing at rest. Cardiac: Regular rate and rhythm, no S3 or significant systolic murmur. Extremities: No pitting edema.  ECG:  An ECG dated 08/02/2023 was personally reviewed today and demonstrated:  Atypical atrial flutter versus atrial tachycardia with 3:1 AV block, heart rate 94 bpm, right bundle branch block.  Labwork: 05/26/2023: TSH 2.480 08/18/2023: ALT 12; AST 16; BUN 17; Creatinine, Ser 1.03; Potassium 4.2; Sodium 140 02/22/2024: Hemoglobin 12.7; Platelets 377     Component Value Date/Time   CHOL 165 05/26/2023 0956   TRIG 88 05/26/2023 0956   HDL 39 (L) 05/26/2023 0956   CHOLHDL 4.2 05/26/2023 0956   CHOLHDL 6.8 05/13/2020 1005   VLDL 31 05/13/2020 1005    LDLCALC 109 (H) 05/26/2023 0956   LDLCALC 107 (H) 12/18/2019 0822   Other Studies Reviewed Today:  Echocardiogram 11/18/2022:  1. Left ventricular ejection fraction, by estimation, is 60 to 65%. The  left ventricle has normal function. The left ventricle has no regional  wall motion abnormalities. There is severe asymmetric left ventricular  hypertrophy of the septal and basal  segments and remainder moderate left ventricular hypertrophy. Left  ventricular diastolic parameters are consistent with Grade I diastolic  dysfunction (impaired relaxation).   2. Right ventricular systolic function is normal. The right ventricular  size is normal.   3. The mitral valve is normal in structure. Trivial mitral valve  regurgitation. No evidence of mitral stenosis.   4. The aortic valve was not well visualized. Aortic valve regurgitation  is not visualized. No aortic stenosis is present.   5. Aortic dilatation noted. There is borderline dilatation of the  ascending aorta, measuring 39 mm.   Assessment and Plan:  1.  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  LVEF 60 to 65% by echocardiogram in July 2024.  Her current cardiac regimen includes Toprol -XL 50 mg daily, Jardiance  10 mg daily, and Entresto  97/103 mg twice daily.  Clinically stable with NYHA class I dyspnea and no fluid retention.  2.  Hypertrophic cardiomyopathy.  Cardiac MRI in 2022 showed LVEF  68% with severe asymmetric LV hypertrophy and patchy late gadolinium enhancement consistent with hypertrophic cardiomyopathy although only 15% of total myocardial mass.  She is asymptomatic and has no obvious LVOT obstruction.   3.  Paroxysmal atrial fibrillation/flutter.  Asymptomatic with intermittent sense of palpitations.  She continues on Eliquis  5 mg twice daily for stroke prophylaxis.  I reviewed her lab work, she denies any spontaneous bleeding problems.   4.   Primary hypertension.  No changes made to current regimen.   5.  Mixed hyperlipidemia.  LDL 109 in January 2025.  Continue Lipitor  80 mg daily.  Disposition:  Follow up 6 months with repeat echocardiogram.  Signed, Jayson JUDITHANN Sierras, M.D., F.A.C.C. [facility]

## 2024-05-20 NOTE — Patient Instructions (Signed)
 Medication Instructions:   Your physician recommends that you continue on your current medications as directed. Please refer to the Current Medication list given to you today.   Labwork: None today  Testing/Procedures:  Echo in JULY   Follow-Up: After Echo  Any Other Special Instructions Will Be Listed Below (If Applicable).  If you need a refill on your cardiac medications before your next appointment, please call your pharmacy.

## 2024-05-21 ENCOUNTER — Telehealth: Payer: Self-pay | Admitting: Pharmacist

## 2024-05-21 ENCOUNTER — Encounter: Payer: Self-pay | Admitting: Neurology

## 2024-05-21 ENCOUNTER — Ambulatory Visit: Admitting: Neurology

## 2024-05-21 ENCOUNTER — Other Ambulatory Visit (HOSPITAL_COMMUNITY): Payer: Self-pay

## 2024-05-21 VITALS — BP 144/81 | HR 100 | Ht 62.0 in | Wt 243.0 lb

## 2024-05-21 DIAGNOSIS — G43709 Chronic migraine without aura, not intractable, without status migrainosus: Secondary | ICD-10-CM | POA: Diagnosis not present

## 2024-05-21 MED ORDER — NURTEC 75 MG PO TBDP: 75.0000 mg | ORAL_TABLET | ORAL | 11 refills | Status: AC | PRN

## 2024-05-21 NOTE — Telephone Encounter (Signed)
 Pharmacy Patient Advocate Encounter  Received notification from [facility] MEDICARE that Prior Authorization for Rimegepant Sulfate (NURTEC) 75 MG TBDP has been APPROVED from 05/21/2024 to 05/01/2025   PA #/Case ID/Reference #: [id number]

## 2024-05-21 NOTE — Progress Notes (Signed)
 "  Patient: Monique Gomez Date of Birth: 05-11-1955  Reason for Visit: Follow up History from: Patient Primary Neurologist: Dr. Onita   ASSESSMENT AND PLAN 69 y.o. year old female   1.  Chronic migraine headache - Worsening headache, facial pressure with retro-orbital pain, top of head, could be migraine, ENT has ruled out sinus issue - Check MRI of the brain with and without contrast to rule out acute abnormality given new type of headache - Continue Qulipta  60 mg daily for migraine prevention - Nurtec 75 mg as needed for acute migraine - Next steps: May resume Botox  if finances allow, try Vyepti, Ajovy - Previously tried and failed: Emgality , Botox , Aimovig , Effexor , Imitrex , Maxalt , Fioricet, diclofenac , metoprolol , Ubrelvy   - MRI of the brain 09/15/21 showed no acute abnormality, has been evaluated by ophthalmology, ENT - Follow up with PCP about CPAP, is older machine, likely needs to be retested - Follow up in 6 months with me  HISTORY OF PRESENT ILLNESS: Today 05/21/24 05/21/24 SS: saw ENT for sinus issues, was told not sinus related. Complains of chronic sinus issues, always on Flonase . Going on for 2 years. She has pressure under her eyes, pressure to top of her head, pain behind her eyes. Migraine? Doesn't feel like severe migraine. Remains on Qulipta  60 mg daily. She took Nurtec yesterday and it helped with all above symptoms. Pressure under eyes noted with weather change mostly, 6-7 times a month, associated with pain top of head. In the past Ubrelvy  didn't help. Recent eye exam was unremarkable.   02/14/24 SS: Here today to discuss migraines. Has been getting Botox . Last was 12/21/23 with me. Can no longer continue Botox  due to cost. Having more migraines. On Emgality , takes 1st of the month, consistently. I sent her prednisone  taper pack yesterday. Taking tylenol  and ubrelvy  without benefit. Feeling much better since starting prednisone  yesterday. With this last cycle of Botox ,  has not helped, tenderness to top of head. Over the last few weeks, migraines returning with increasing severity. Yesterday bad migraine, in the bed, with migraine features, pain to top of head, in temples, reminded her of how bad migraines used to be. Under a lot of stress, is not able to work, financial stressors.   11/10/21 SS: Monique Gomez here today for follow-up.  MRI of the brain in May 2023 showed no acute abnormality, it did show mild chronic small vessel ischemic changes, partially empty sella turcica. In the ER 11/02/21 for headache, she was given IV cocktail of IV fluids, magnesium  sulfate, Compazine , Toradol , Benadryl , Decadron  with resolution of headache.  CT head showed no acute abnormality. Has seen ENT, told inflamed nasal passages. On Flonase . Was told in ER, Flonase  causes rebound headaches, when she stopped had swelling of her face. Headaches top of her head, temples. Ubrelvy  helps, but has to use sparingly. Tylenol  helps but not as good as Ubrelvy . On Emgality , has done 3 injections so far, not much change. Cannot identify any triggers for migraines, doesn't have any stress, she is retired. Can have dizziness, nausea, vomiting, sometimes sensitive to light/sound. This is her typical migraine pattern.   HISTORY  08/25/21 SS: Here today for follow-up, she couldn't get the virtual visit to work x 3. Sooner appointment. Reports for months, pressure around her eyes, dizziness felt related to sinuses. Sinuses were swollen hard to assess, took oral steroids x 7 days, things cleared. Using nasal spray and Zyrtec . Reports tingling around lips, feels pressure behind eyes. Not having migraine  headaches. Reports pain to occipital area, crown, is like pounding. Is now retired. 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 Takes Ubrelvy  with good benefit or Tylenol . She thought all of this was related to sinus issues, but ENT assured her not. Told to come see neurologist.  Migraines are typically severe pain, sensitive to light, nauseated. Current headaches are sporadic, few times a week, lasts until Ubrelvy  or Tylenol . No exacerbating factors. Feel better when lying down. Denies numbness or weakness, no trouble with gait.   REVIEW OF SYSTEMS: Out of a complete 14 system review of symptoms, the patient complains only of the following symptoms, and all other reviewed systems are negative.  See HPI  ALLERGIES: Allergies  Allergen Reactions   Venlafaxine  Nausea And Vomiting and Other (See Comments)    Dizziness, shakiness    HOME MEDICATIONS: Outpatient Medications Prior to Visit  Medication Sig Dispense Refill   albuterol  (VENTOLIN  HFA) 108 (90 Base) MCG/ACT inhaler INHALE 2 PUFFS BY MOUTH EVERY 6 HOURS AS NEEDED FOR WHEEZING FOR SHORTNESS OF BREATH 18 g 0   apixaban  (ELIQUIS ) 5 MG TABS tablet TAKE 1 TABLET BY MOUTH TWICE  DAILY 200 tablet 1   Atogepant  (QULIPTA ) 60 MG TABS Take 1 tablet (60 mg total) by mouth daily. 30 tablet 11   atorvastatin  (LIPITOR ) 80 MG tablet TAKE 1 TABLET BY MOUTH DAILY 100 tablet 2   blood glucose meter kit and supplies KIT Dispense based on patient and insurance preference. Use up to four times daily as directed. 1 each 0   Chlorphen-PE-Acetaminophen  4-10-325 MG TABS Take 1 tablet by mouth in the morning, at noon, and at bedtime. 21 tablet 0   cyclobenzaprine  (FLEXERIL ) 10 MG tablet One tablet every twelve hours as needed for spasm. 60 tablet 2   empagliflozin  (JARDIANCE ) 10 MG TABS tablet TAKE 1 TABLET BY MOUTH DAILY  BEFORE BREAKFAST 90 tablet 2   esomeprazole  (NEXIUM ) 40 MG capsule TAKE 1 CAPSULE BY MOUTH DAILY 100 capsule 2   fluticasone  (FLONASE ) 50 MCG/ACT nasal spray Place 2 sprays into both nostrils daily. 16 g 5   gabapentin  (NEURONTIN ) 300 MG capsule TAKE 1 CAPSULE BY MOUTH AT  BEDTIME 100 capsule 2   ipratropium (ATROVENT ) 0.03 % nasal spray Place 2 sprays into both nostrils every 12 (twelve) hours. 30 mL 3   Iron -FA-B  Cmp-C-Biot-Probiotic (FUSION PLUS) CAPS Take 1 capsule by mouth daily. 30 capsule 11   lidocaine  (LIDODERM ) 5 % USE 1 PATCH EXTERNALLY ONCE DAILY REMOVE  AND  DISCARD  PATCH  WITHIN  12  HOURS  OR  AS  DIRECTED 30 patch 0   meclizine  (ANTIVERT ) 25 MG tablet Take 1 tablet (25 mg total) by mouth 3 (three) times daily as needed for dizziness. 30 tablet 0   metFORMIN  (GLUCOPHAGE ) 500 MG tablet TAKE 1 TABLET BY MOUTH TWICE  DAILY WITH A MEAL 200 tablet 2   metoCLOPramide  (REGLAN ) 10 MG tablet Take 1 tablet (10 mg total) by mouth every 8 (eight) hours as needed for up to 7 days for nausea (Headache). 21 tablet 0   sacubitril -valsartan  (ENTRESTO ) 97-103 MG Take 1 tablet by mouth 2 (two) times daily. 200 tablet 0   acetaminophen  (TYLENOL ) 500 MG tablet Take 1 tablet (500 mg total) by mouth every 6 (six) hours as needed. (Patient not taking: Reported on 05/21/2024) 30 tablet 0   BOTOX  200 units injection PROVIDER TO INJECT 155 UNITS INTO THE MUSCLES OF THE HEAD AND NECK EVERY  3 MONTHS. DISCARD REMAINDER (Patient not taking: Reported on 05/21/2024) 1 each 2   conjugated estrogens  (PREMARIN ) vaginal cream Apply 0.5 gm which is a pea-sized amount with the applicator or fingertip vaginally at bedtime for 2 weeks and then 2-3 x weekly. (Patient not taking: Reported on 05/21/2024) 42.5 g 12   hydrocortisone  (ANUSOL -HC) 25 MG suppository Place 1 suppository (25 mg total) rectally 2 (two) times daily. (Patient not taking: Reported on 05/21/2024) 12 suppository 2   metoprolol  succinate (TOPROL -XL) 50 MG 24 hr tablet TAKE 1 TABLET BY MOUTH IN THE  MORNING AND 1 TABLET BY MOUTH AT BEDTIME WITH OR IMMEDIATELY  FOLLOWING A MEAL (Patient not taking: Reported on 05/21/2024) 200 tablet 2   No facility-administered medications prior to visit.    PAST MEDICAL HISTORY: Past Medical History:  Diagnosis Date   Abdominal fibromatosis    Allergic rhinitis    Anemia    Asthma    Essential hypertension    GERD (gastroesophageal  reflux disease)    History of cardiac catheterization    No significant CAD May 2015   History of migraine headaches    Hyperlipidemia    Iron  deficiency anemia 02/05/2021   Myocarditis (HCC)    a. diagnosed in 05/2020 with cath showing normal cors   Obesity    PAF (paroxysmal atrial fibrillation) (HCC) 08/2013   Sleep apnea    Type 2 diabetes mellitus (HCC)     PAST SURGICAL HISTORY: Past Surgical History:  Procedure Laterality Date   CATARACT EXTRACTION     CATARACT EXTRACTION W/PHACO Right 04/05/2019   Procedure: CATARACT EXTRACTION PHACO AND INTRAOCULAR LENS PLACEMENT RIGHT EYE (CDE: 3.19);  Surgeon: Harrie Agent, MD;  Location: [facility];  Service: Ophthalmology;  Laterality: Right;   COLONOSCOPY N/A 02/02/2015   SLF: 1. one colon polyp removed-no source for anemia identified. 2. moderate diverticulosis noted in the sigmoid colon and descending colon 3. the left colon is redundant 4. Rectal bleeding due ot small internal hemorroids 5. Moderate sized external hemorrhoids.   ESOPHAGOGASTRODUODENOSCOPY N/A 02/02/2015   SLF: 1. 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 large hiatal hernia 3. mild non-erosive gastritis 4. No source for anemia identified.    LEFT HEART CATH AND CORONARY ANGIOGRAPHY N/A 05/14/2020   Procedure: LEFT HEART CATH AND CORONARY ANGIOGRAPHY;  Surgeon: Claudene Victory ORN, MD;  Location: [facility];  Service: Cardiovascular;  Laterality: N/A;   LEFT HEART CATHETERIZATION WITH CORONARY ANGIOGRAM N/A 09/03/2013   Procedure: LEFT HEART CATHETERIZATION WITH CORONARY ANGIOGRAM;  Surgeon: Candyce GORMAN Reek, MD;  Location: [facility] CATH LAB;  Service: Cardiovascular;  Laterality: N/A;   Right carpal tunnel release      FAMILY HISTORY: Family History  Problem Relation Age of Onset   Diabetes Mother    Hypertension Mother    Heart disease Mother    High Cholesterol Mother    Alzheimer's disease Father    Hypertension Sister    Hypertension Brother     Diabetes Brother    Arthritis Sister    Hypertension Sister    High Cholesterol Sister    Diabetes Son    Developmental delay Son    Colon cancer Neg Hx     SOCIAL HISTORY: Social History   Socioeconomic History   Marital status: Single    Spouse name: Not on file   Number of children: 2   Years of education: Not on file   Highest education level: Not on file  Occupational History  Occupation: Hospital Doctor: [employer]  Tobacco Use   Smoking status: Former    Current packs/day: 0.00    Average packs/day: 0.3 packs/day    Types: Cigarettes    Quit date: 04/01/2012    Years since quitting: 12.1   Smokeless tobacco: Never  Vaping Use   Vaping status: Never Used  Substance and Sexual Activity   Alcohol use: Not Currently    Alcohol/week: 0.0 standard drinks of alcohol    Comment: very occasional   Drug use: No   Sexual activity: Not Currently    Birth control/protection: Post-menopausal  Other Topics Concern   Not on file  Social History Narrative   Not on file   Social Drivers of Health   Tobacco Use: Medium Risk (05/21/2024)   Patient History    Smoking Tobacco Use: Former    Smokeless Tobacco Use: Never    Passive Exposure: Not on Actuary Strain: Low Risk (01/23/2023)   Overall Financial Resource Strain (CARDIA)    Difficulty of Paying Living Expenses: Not hard at all  Food Insecurity: No Food Insecurity (01/23/2023)   Hunger Vital Sign    Worried About Running Out of Food in the Last Year: Never true    Ran Out of Food in the Last Year: Never true  Transportation Needs: No Transportation Needs (01/23/2023)   PRAPARE - Administrator, Civil Service (Medical): No    Lack of Transportation (Non-Medical): No  Physical Activity: Insufficiently Active (01/23/2023)   Exercise Vital Sign    Days of Exercise per Week: 7 days    Minutes of Exercise per Session: 20 min  Stress: No Stress Concern Present (01/23/2023)    [employer] of Occupational Health - Occupational Stress Questionnaire    Feeling of Stress : Not at all  Social Connections: Socially Integrated (01/23/2023)   Social Connection and Isolation Panel    Frequency of Communication with Friends and Family: More than three times a week    Frequency of Social Gatherings with Friends and Family: More than three times a week    Attends Religious Services: More than 4 times per year    Active Member of Clubs or Organizations: Yes    Attends Banker Meetings: More than 4 times per year    Marital Status: Married  Catering Manager Violence: Not At Risk (01/23/2023)   Humiliation, Afraid, Rape, and Kick questionnaire    Fear of Current or Ex-Partner: No    Emotionally Abused: No    Physically Abused: No    Sexually Abused: No  Depression (PHQ2-9): Low Risk (03/05/2024)   Depression (PHQ2-9)    PHQ-2 Score: 0  Alcohol Screen: Low Risk (01/23/2023)   Alcohol Screen    Last Alcohol Screening Score (AUDIT): 0  Housing: Low Risk (01/23/2023)   Housing    Last Housing Risk Score: 0  Utilities: Not At Risk (01/23/2023)   AHC Utilities    Threatened with loss of utilities: No  Health Literacy: Adequate Health Literacy (01/23/2023)   B1300 Health Literacy    Frequency of need for help with medical instructions: Never   PHYSICAL EXAM  Vitals:   05/21/24 1549  BP: (!) 144/81  Pulse: 100  Weight: 243 lb (110.2 kg)  Height: 5' 2 (1.575 m)     Body mass index is 44.45 kg/m.  Generalized: Well developed, in no acute distress  Neurological examination  Mentation: Alert oriented to time, place, history  taking. Follows all commands speech and language fluent Cranial nerve II-XII: Pupils were equal round reactive to light. Extraocular movements were full, visual field were full on confrontational test. Facial sensation and strength were normal. Head turning and shoulder shrug  were normal and symmetric. Motor: The motor testing  reveals 5 over 5 strength of all 4 extremities. Good symmetric motor tone is noted throughout.  Sensory: Sensory testing is intact to soft touch on all 4 extremities. No evidence of extinction is noted.  Coordination: Cerebellar testing reveals good finger-nose-finger and heel-to-shin bilaterally.  Gait and station: Gait is normal.   DIAGNOSTIC DATA (LABS, IMAGING, TESTING) - I reviewed patient records, labs, notes, testing and imaging myself where available.  Lab Results  Component Value Date   WBC 10.9 (H) 02/22/2024   HGB 12.7 02/22/2024   HCT 42.8 02/22/2024   MCV 71.2 (L) 02/22/2024   PLT 377 02/22/2024      Component Value Date/Time   NA 140 08/18/2023 1005   NA 143 05/26/2023 0956   K 4.2 08/18/2023 1005   CL 105 08/18/2023 1005   CO2 28 08/18/2023 1005   GLUCOSE 124 (H) 08/18/2023 1005   BUN 17 08/18/2023 1005   BUN 13 05/26/2023 0956   CREATININE 1.03 (H) 08/18/2023 1005   CREATININE 0.84 12/18/2019 0822   CALCIUM  9.8 08/18/2023 1005   PROT 7.9 08/18/2023 1005   PROT 7.0 05/26/2023 0956   ALBUMIN 3.6 08/18/2023 1005   ALBUMIN 4.0 05/26/2023 0956   AST 16 08/18/2023 1005   ALT 12 08/18/2023 1005   ALKPHOS 84 08/18/2023 1005   BILITOT 0.5 08/18/2023 1005   BILITOT <0.2 05/26/2023 0956   GFRNONAA 60 (L) 08/18/2023 1005   GFRNONAA 83 10/23/2017 0811   GFRAA >60 10/15/2019 0850   GFRAA 97 10/23/2017 0811   Lab Results  Component Value Date   CHOL 165 05/26/2023   HDL 39 (L) 05/26/2023   LDLCALC 109 (H) 05/26/2023   TRIG 88 05/26/2023   CHOLHDL 4.2 05/26/2023   Lab Results  Component Value Date   HGBA1C 6.0 (H) 09/26/2023   Lab Results  Component Value Date   VITAMINB12 401 05/15/2020   Lab Results  Component Value Date   TSH 2.480 05/26/2023    Lauraine Born, AGNP-C, DNP 05/21/2024, 4:12 PM [facility] [address] [place], [place] [address] [phone]   "

## 2024-05-21 NOTE — Patient Instructions (Signed)
 Check MRI of the brain Nurtec 75 mg as needed for acute migraine Continue Qulipta  for migraine prevention Follow-up 6 months

## 2024-05-21 NOTE — Telephone Encounter (Signed)
 Pharmacy Patient Advocate Encounter   Received notification from [facility] that prior authorization for Nurtec 75MG  dispersible tablets is required/requested.   Insurance verification completed.   The patient is insured through [facility].   Per test claim: PA required; PA submitted to above mentioned insurance via Latent Key/confirmation #/EOC [id number] Status is pending

## 2024-05-23 ENCOUNTER — Telehealth: Payer: Self-pay | Admitting: Neurology

## 2024-05-23 NOTE — Telephone Encounter (Signed)
 no auth required sent to GI [phone]

## 2024-05-30 ENCOUNTER — Ambulatory Visit: Admitting: Gastroenterology

## 2024-06-16 ENCOUNTER — Other Ambulatory Visit

## 2024-06-25 ENCOUNTER — Ambulatory Visit: Admitting: Gastroenterology

## 2024-07-03 ENCOUNTER — Ambulatory Visit: Admitting: Internal Medicine

## 2024-09-17 ENCOUNTER — Ambulatory Visit: Admitting: Neurology

## 2024-11-04 ENCOUNTER — Ambulatory Visit

## 2024-11-14 ENCOUNTER — Ambulatory Visit (INDEPENDENT_AMBULATORY_CARE_PROVIDER_SITE_OTHER): Admitting: Otolaryngology
